# Patient Record
Sex: Male | Born: 1940
Health system: Southern US, Community
[De-identification: ages and names within clinical notes are randomized; demographics above are authoritative.]

## PROBLEM LIST (undated history)

## (undated) DIAGNOSIS — C9000 Multiple myeloma not having achieved remission: Secondary | ICD-10-CM

## (undated) DIAGNOSIS — J439 Emphysema, unspecified: Secondary | ICD-10-CM

## (undated) DIAGNOSIS — M549 Dorsalgia, unspecified: Secondary | ICD-10-CM

## (undated) DIAGNOSIS — G709 Myoneural disorder, unspecified: Secondary | ICD-10-CM

## (undated) DIAGNOSIS — N400 Enlarged prostate without lower urinary tract symptoms: Secondary | ICD-10-CM

## (undated) DIAGNOSIS — J42 Unspecified chronic bronchitis: Secondary | ICD-10-CM

## (undated) DIAGNOSIS — E079 Disorder of thyroid, unspecified: Secondary | ICD-10-CM

## (undated) DIAGNOSIS — I10 Essential (primary) hypertension: Secondary | ICD-10-CM

## (undated) DIAGNOSIS — I509 Heart failure, unspecified: Secondary | ICD-10-CM

## (undated) DIAGNOSIS — M542 Cervicalgia: Secondary | ICD-10-CM

## (undated) DIAGNOSIS — K219 Gastro-esophageal reflux disease without esophagitis: Secondary | ICD-10-CM

## (undated) DIAGNOSIS — G8929 Other chronic pain: Secondary | ICD-10-CM

## (undated) DIAGNOSIS — J45909 Unspecified asthma, uncomplicated: Secondary | ICD-10-CM

## (undated) DIAGNOSIS — I82441 Acute embolism and thrombosis of right tibial vein: Secondary | ICD-10-CM

## (undated) DIAGNOSIS — H269 Unspecified cataract: Secondary | ICD-10-CM

## (undated) DIAGNOSIS — C801 Malignant (primary) neoplasm, unspecified: Secondary | ICD-10-CM

## (undated) DIAGNOSIS — M2550 Pain in unspecified joint: Secondary | ICD-10-CM

## (undated) DIAGNOSIS — H3530 Unspecified macular degeneration: Secondary | ICD-10-CM

## (undated) DIAGNOSIS — M1990 Unspecified osteoarthritis, unspecified site: Secondary | ICD-10-CM

## (undated) DIAGNOSIS — L299 Pruritus, unspecified: Secondary | ICD-10-CM

## (undated) DIAGNOSIS — M79609 Pain in unspecified limb: Secondary | ICD-10-CM

## (undated) DIAGNOSIS — E785 Hyperlipidemia, unspecified: Secondary | ICD-10-CM

## (undated) DIAGNOSIS — J189 Pneumonia, unspecified organism: Secondary | ICD-10-CM

## (undated) DIAGNOSIS — I219 Acute myocardial infarction, unspecified: Secondary | ICD-10-CM

## (undated) DIAGNOSIS — Z860100 Personal history of colon polyps, unspecified: Secondary | ICD-10-CM

## (undated) DIAGNOSIS — G4730 Sleep apnea, unspecified: Secondary | ICD-10-CM

## (undated) DIAGNOSIS — M810 Age-related osteoporosis without current pathological fracture: Secondary | ICD-10-CM

## (undated) DIAGNOSIS — R42 Dizziness and giddiness: Secondary | ICD-10-CM

## (undated) DIAGNOSIS — T4145XA Adverse effect of unspecified anesthetic, initial encounter: Secondary | ICD-10-CM

## (undated) DIAGNOSIS — K5790 Diverticulosis of intestine, part unspecified, without perforation or abscess without bleeding: Secondary | ICD-10-CM

## (undated) DIAGNOSIS — T8859XA Other complications of anesthesia, initial encounter: Secondary | ICD-10-CM

## (undated) HISTORY — DX: Age-related osteoporosis without current pathological fracture: M81.0

## (undated) HISTORY — DX: Heart failure, unspecified: I50.9

## (undated) HISTORY — DX: Multiple myeloma not having achieved remission: C90.00

## (undated) HISTORY — DX: Unspecified cataract: H26.9

## (undated) HISTORY — PX: CARDIAC CATHETERIZATION: SHX172

## (undated) HISTORY — DX: Pain in unspecified limb: M79.609

## (undated) HISTORY — PX: COLONOSCOPY: SHX174

## (undated) HISTORY — DX: Acute embolism and thrombosis of right tibial vein: I82.441

## (undated) HISTORY — PX: BONE MARROW TRANSPLANT: SHX200

## (undated) HISTORY — PX: OTHER SURGICAL HISTORY: SHX169

## (undated) HISTORY — DX: Sleep apnea, unspecified: G47.30

## (undated) HISTORY — DX: Disorder of thyroid, unspecified: E07.9

## (undated) HISTORY — DX: Emphysema, unspecified: J43.9

## (undated) HISTORY — DX: Myoneural disorder, unspecified: G70.9

---

## 2004-02-15 HISTORY — PX: HERNIA REPAIR: SHX51

## 2008-02-15 DIAGNOSIS — I219 Acute myocardial infarction, unspecified: Secondary | ICD-10-CM

## 2008-02-15 HISTORY — DX: Acute myocardial infarction, unspecified: I21.9

## 2011-02-28 DIAGNOSIS — L578 Other skin changes due to chronic exposure to nonionizing radiation: Secondary | ICD-10-CM | POA: Diagnosis not present

## 2011-02-28 DIAGNOSIS — D492 Neoplasm of unspecified behavior of bone, soft tissue, and skin: Secondary | ICD-10-CM | POA: Diagnosis not present

## 2011-02-28 DIAGNOSIS — C44319 Basal cell carcinoma of skin of other parts of face: Secondary | ICD-10-CM | POA: Diagnosis not present

## 2011-03-08 DIAGNOSIS — D492 Neoplasm of unspecified behavior of bone, soft tissue, and skin: Secondary | ICD-10-CM | POA: Diagnosis not present

## 2011-03-08 DIAGNOSIS — L578 Other skin changes due to chronic exposure to nonionizing radiation: Secondary | ICD-10-CM | POA: Diagnosis not present

## 2011-03-08 DIAGNOSIS — C44319 Basal cell carcinoma of skin of other parts of face: Secondary | ICD-10-CM | POA: Diagnosis not present

## 2011-03-09 DIAGNOSIS — C44319 Basal cell carcinoma of skin of other parts of face: Secondary | ICD-10-CM | POA: Diagnosis not present

## 2011-03-18 DIAGNOSIS — I10 Essential (primary) hypertension: Secondary | ICD-10-CM | POA: Diagnosis not present

## 2011-03-18 DIAGNOSIS — Z79899 Other long term (current) drug therapy: Secondary | ICD-10-CM | POA: Diagnosis not present

## 2011-03-18 DIAGNOSIS — K219 Gastro-esophageal reflux disease without esophagitis: Secondary | ICD-10-CM | POA: Diagnosis not present

## 2011-03-18 DIAGNOSIS — I2510 Atherosclerotic heart disease of native coronary artery without angina pectoris: Secondary | ICD-10-CM | POA: Diagnosis not present

## 2011-03-18 DIAGNOSIS — Z7982 Long term (current) use of aspirin: Secondary | ICD-10-CM | POA: Diagnosis not present

## 2011-03-18 DIAGNOSIS — Z09 Encounter for follow-up examination after completed treatment for conditions other than malignant neoplasm: Secondary | ICD-10-CM | POA: Diagnosis not present

## 2011-03-18 DIAGNOSIS — I259 Chronic ischemic heart disease, unspecified: Secondary | ICD-10-CM | POA: Diagnosis not present

## 2011-03-18 DIAGNOSIS — E785 Hyperlipidemia, unspecified: Secondary | ICD-10-CM | POA: Diagnosis not present

## 2011-03-18 DIAGNOSIS — I252 Old myocardial infarction: Secondary | ICD-10-CM | POA: Diagnosis not present

## 2011-03-18 DIAGNOSIS — J45909 Unspecified asthma, uncomplicated: Secondary | ICD-10-CM | POA: Diagnosis not present

## 2011-04-06 DIAGNOSIS — Z85828 Personal history of other malignant neoplasm of skin: Secondary | ICD-10-CM | POA: Diagnosis not present

## 2011-04-06 DIAGNOSIS — L905 Scar conditions and fibrosis of skin: Secondary | ICD-10-CM | POA: Diagnosis not present

## 2011-04-29 DIAGNOSIS — R51 Headache: Secondary | ICD-10-CM | POA: Diagnosis not present

## 2011-04-29 DIAGNOSIS — I10 Essential (primary) hypertension: Secondary | ICD-10-CM | POA: Diagnosis not present

## 2011-04-29 DIAGNOSIS — R42 Dizziness and giddiness: Secondary | ICD-10-CM | POA: Diagnosis not present

## 2011-05-04 DIAGNOSIS — L905 Scar conditions and fibrosis of skin: Secondary | ICD-10-CM | POA: Diagnosis not present

## 2011-05-04 DIAGNOSIS — Z85828 Personal history of other malignant neoplasm of skin: Secondary | ICD-10-CM | POA: Diagnosis not present

## 2011-05-11 DIAGNOSIS — J00 Acute nasopharyngitis [common cold]: Secondary | ICD-10-CM | POA: Diagnosis not present

## 2011-05-11 DIAGNOSIS — I10 Essential (primary) hypertension: Secondary | ICD-10-CM | POA: Diagnosis not present

## 2011-06-06 DIAGNOSIS — Z87891 Personal history of nicotine dependence: Secondary | ICD-10-CM | POA: Diagnosis not present

## 2011-06-06 DIAGNOSIS — R0789 Other chest pain: Secondary | ICD-10-CM | POA: Diagnosis not present

## 2011-06-06 DIAGNOSIS — I2510 Atherosclerotic heart disease of native coronary artery without angina pectoris: Secondary | ICD-10-CM | POA: Diagnosis not present

## 2011-06-06 DIAGNOSIS — I498 Other specified cardiac arrhythmias: Secondary | ICD-10-CM | POA: Diagnosis not present

## 2011-06-06 DIAGNOSIS — R918 Other nonspecific abnormal finding of lung field: Secondary | ICD-10-CM | POA: Diagnosis not present

## 2011-06-06 DIAGNOSIS — N400 Enlarged prostate without lower urinary tract symptoms: Secondary | ICD-10-CM | POA: Diagnosis not present

## 2011-06-06 DIAGNOSIS — I2582 Chronic total occlusion of coronary artery: Secondary | ICD-10-CM | POA: Diagnosis not present

## 2011-06-06 DIAGNOSIS — I4510 Unspecified right bundle-branch block: Secondary | ICD-10-CM | POA: Diagnosis not present

## 2011-06-06 DIAGNOSIS — I259 Chronic ischemic heart disease, unspecified: Secondary | ICD-10-CM | POA: Diagnosis not present

## 2011-06-06 DIAGNOSIS — Z7982 Long term (current) use of aspirin: Secondary | ICD-10-CM | POA: Diagnosis not present

## 2011-06-06 DIAGNOSIS — J45909 Unspecified asthma, uncomplicated: Secondary | ICD-10-CM | POA: Diagnosis present

## 2011-06-06 DIAGNOSIS — F411 Generalized anxiety disorder: Secondary | ICD-10-CM | POA: Diagnosis not present

## 2011-06-06 DIAGNOSIS — I452 Bifascicular block: Secondary | ICD-10-CM | POA: Diagnosis not present

## 2011-06-06 DIAGNOSIS — K2100 Gastro-esophageal reflux disease with esophagitis, without bleeding: Secondary | ICD-10-CM | POA: Diagnosis present

## 2011-06-06 DIAGNOSIS — E785 Hyperlipidemia, unspecified: Secondary | ICD-10-CM | POA: Diagnosis present

## 2011-06-06 DIAGNOSIS — R079 Chest pain, unspecified: Secondary | ICD-10-CM | POA: Diagnosis not present

## 2011-06-06 DIAGNOSIS — Z79899 Other long term (current) drug therapy: Secondary | ICD-10-CM | POA: Diagnosis not present

## 2011-06-06 DIAGNOSIS — K219 Gastro-esophageal reflux disease without esophagitis: Secondary | ICD-10-CM | POA: Diagnosis not present

## 2011-06-06 DIAGNOSIS — I209 Angina pectoris, unspecified: Secondary | ICD-10-CM | POA: Diagnosis present

## 2011-06-06 DIAGNOSIS — I10 Essential (primary) hypertension: Secondary | ICD-10-CM | POA: Diagnosis not present

## 2011-06-06 DIAGNOSIS — I200 Unstable angina: Secondary | ICD-10-CM | POA: Diagnosis not present

## 2011-06-06 DIAGNOSIS — I252 Old myocardial infarction: Secondary | ICD-10-CM | POA: Diagnosis not present

## 2011-06-06 DIAGNOSIS — R55 Syncope and collapse: Secondary | ICD-10-CM | POA: Diagnosis not present

## 2011-06-16 DIAGNOSIS — R42 Dizziness and giddiness: Secondary | ICD-10-CM | POA: Diagnosis not present

## 2011-06-16 DIAGNOSIS — J45909 Unspecified asthma, uncomplicated: Secondary | ICD-10-CM | POA: Diagnosis not present

## 2011-06-16 DIAGNOSIS — I259 Chronic ischemic heart disease, unspecified: Secondary | ICD-10-CM | POA: Diagnosis not present

## 2011-06-16 DIAGNOSIS — R079 Chest pain, unspecified: Secondary | ICD-10-CM | POA: Diagnosis not present

## 2011-06-22 DIAGNOSIS — Z85828 Personal history of other malignant neoplasm of skin: Secondary | ICD-10-CM | POA: Diagnosis not present

## 2011-06-22 DIAGNOSIS — L905 Scar conditions and fibrosis of skin: Secondary | ICD-10-CM | POA: Diagnosis not present

## 2011-07-08 DIAGNOSIS — I2510 Atherosclerotic heart disease of native coronary artery without angina pectoris: Secondary | ICD-10-CM | POA: Diagnosis not present

## 2011-07-08 DIAGNOSIS — J45909 Unspecified asthma, uncomplicated: Secondary | ICD-10-CM | POA: Diagnosis not present

## 2011-07-08 DIAGNOSIS — I252 Old myocardial infarction: Secondary | ICD-10-CM | POA: Diagnosis not present

## 2011-07-08 DIAGNOSIS — K219 Gastro-esophageal reflux disease without esophagitis: Secondary | ICD-10-CM | POA: Diagnosis not present

## 2011-07-08 DIAGNOSIS — E785 Hyperlipidemia, unspecified: Secondary | ICD-10-CM | POA: Diagnosis not present

## 2011-07-08 DIAGNOSIS — I10 Essential (primary) hypertension: Secondary | ICD-10-CM | POA: Diagnosis not present

## 2011-09-05 DIAGNOSIS — L820 Inflamed seborrheic keratosis: Secondary | ICD-10-CM | POA: Diagnosis not present

## 2011-09-05 DIAGNOSIS — Z85828 Personal history of other malignant neoplasm of skin: Secondary | ICD-10-CM | POA: Diagnosis not present

## 2011-09-05 DIAGNOSIS — L578 Other skin changes due to chronic exposure to nonionizing radiation: Secondary | ICD-10-CM | POA: Diagnosis not present

## 2011-09-05 DIAGNOSIS — L570 Actinic keratosis: Secondary | ICD-10-CM | POA: Diagnosis not present

## 2011-09-27 DIAGNOSIS — M542 Cervicalgia: Secondary | ICD-10-CM | POA: Diagnosis not present

## 2011-09-27 DIAGNOSIS — M47812 Spondylosis without myelopathy or radiculopathy, cervical region: Secondary | ICD-10-CM | POA: Diagnosis not present

## 2011-10-04 DIAGNOSIS — M542 Cervicalgia: Secondary | ICD-10-CM | POA: Diagnosis not present

## 2011-10-19 DIAGNOSIS — M542 Cervicalgia: Secondary | ICD-10-CM | POA: Diagnosis not present

## 2011-10-30 DIAGNOSIS — M5020 Other cervical disc displacement, unspecified cervical region: Secondary | ICD-10-CM | POA: Diagnosis not present

## 2011-10-31 DIAGNOSIS — Z0000 Encounter for general adult medical examination without abnormal findings: Secondary | ICD-10-CM | POA: Diagnosis not present

## 2011-10-31 DIAGNOSIS — J449 Chronic obstructive pulmonary disease, unspecified: Secondary | ICD-10-CM | POA: Diagnosis not present

## 2011-10-31 DIAGNOSIS — I10 Essential (primary) hypertension: Secondary | ICD-10-CM | POA: Diagnosis not present

## 2011-10-31 DIAGNOSIS — E785 Hyperlipidemia, unspecified: Secondary | ICD-10-CM | POA: Diagnosis not present

## 2011-10-31 DIAGNOSIS — D649 Anemia, unspecified: Secondary | ICD-10-CM | POA: Diagnosis not present

## 2011-10-31 DIAGNOSIS — R972 Elevated prostate specific antigen [PSA]: Secondary | ICD-10-CM | POA: Insufficient documentation

## 2011-11-15 DIAGNOSIS — M47812 Spondylosis without myelopathy or radiculopathy, cervical region: Secondary | ICD-10-CM | POA: Diagnosis not present

## 2011-11-15 DIAGNOSIS — M542 Cervicalgia: Secondary | ICD-10-CM | POA: Diagnosis not present

## 2011-11-15 DIAGNOSIS — M4802 Spinal stenosis, cervical region: Secondary | ICD-10-CM | POA: Diagnosis not present

## 2012-01-06 DIAGNOSIS — Z23 Encounter for immunization: Secondary | ICD-10-CM | POA: Diagnosis not present

## 2012-01-06 DIAGNOSIS — I2510 Atherosclerotic heart disease of native coronary artery without angina pectoris: Secondary | ICD-10-CM | POA: Diagnosis not present

## 2012-01-16 DIAGNOSIS — I658 Occlusion and stenosis of other precerebral arteries: Secondary | ICD-10-CM | POA: Diagnosis not present

## 2012-01-19 DIAGNOSIS — M5412 Radiculopathy, cervical region: Secondary | ICD-10-CM | POA: Diagnosis not present

## 2012-01-19 DIAGNOSIS — M4802 Spinal stenosis, cervical region: Secondary | ICD-10-CM | POA: Diagnosis not present

## 2012-01-19 DIAGNOSIS — M542 Cervicalgia: Secondary | ICD-10-CM | POA: Diagnosis not present

## 2012-01-20 ENCOUNTER — Other Ambulatory Visit: Payer: Self-pay | Admitting: Neurosurgery

## 2012-01-24 ENCOUNTER — Encounter (HOSPITAL_COMMUNITY): Payer: Self-pay | Admitting: Pharmacy Technician

## 2012-01-27 ENCOUNTER — Encounter (HOSPITAL_COMMUNITY)
Admission: RE | Admit: 2012-01-27 | Discharge: 2012-01-27 | Disposition: A | Discharge status: Home / Self Care | Payer: Medicare Other | Source: Ambulatory Visit | Attending: Neurosurgery | Admitting: Neurosurgery

## 2012-01-27 ENCOUNTER — Encounter (HOSPITAL_COMMUNITY): Payer: Self-pay

## 2012-01-27 HISTORY — DX: Essential (primary) hypertension: I10

## 2012-01-27 HISTORY — DX: Personal history of colonic polyps: Z86.010

## 2012-01-27 HISTORY — DX: Other chronic pain: G89.29

## 2012-01-27 HISTORY — DX: Unspecified asthma, uncomplicated: J45.909

## 2012-01-27 HISTORY — DX: Pain in unspecified joint: M25.50

## 2012-01-27 HISTORY — DX: Other complications of anesthesia, initial encounter: T88.59XA

## 2012-01-27 HISTORY — DX: Acute myocardial infarction, unspecified: I21.9

## 2012-01-27 HISTORY — DX: Hyperlipidemia, unspecified: E78.5

## 2012-01-27 HISTORY — DX: Adverse effect of unspecified anesthetic, initial encounter: T41.45XA

## 2012-01-27 HISTORY — DX: Unspecified macular degeneration: H35.30

## 2012-01-27 HISTORY — DX: Dizziness and giddiness: R42

## 2012-01-27 HISTORY — DX: Unspecified osteoarthritis, unspecified site: M19.90

## 2012-01-27 HISTORY — DX: Unspecified chronic bronchitis: J42

## 2012-01-27 HISTORY — DX: Dorsalgia, unspecified: M54.9

## 2012-01-27 HISTORY — DX: Emphysema, unspecified: J43.9

## 2012-01-27 HISTORY — DX: Benign prostatic hyperplasia without lower urinary tract symptoms: N40.0

## 2012-01-27 HISTORY — DX: Cervicalgia: M54.2

## 2012-01-27 HISTORY — DX: Pneumonia, unspecified organism: J18.9

## 2012-01-27 HISTORY — DX: Personal history of colon polyps, unspecified: Z86.0100

## 2012-01-27 HISTORY — DX: Diverticulosis of intestine, part unspecified, without perforation or abscess without bleeding: K57.90

## 2012-01-27 HISTORY — DX: Gastro-esophageal reflux disease without esophagitis: K21.9

## 2012-01-27 HISTORY — DX: Pruritus, unspecified: L29.9

## 2012-01-27 LAB — CBC
Hemoglobin: 11.9 g/dL — ABNORMAL LOW (ref 13.0–17.0)
MCH: 29 pg (ref 26.0–34.0)
MCHC: 32.7 g/dL (ref 30.0–36.0)
MCV: 88.6 fL (ref 78.0–100.0)
RBC: 4.11 MIL/uL — ABNORMAL LOW (ref 4.22–5.81)

## 2012-01-27 LAB — BASIC METABOLIC PANEL
BUN: 11 mg/dL (ref 6–23)
CO2: 27 mEq/L (ref 19–32)
GFR calc non Af Amer: 81 mL/min — ABNORMAL LOW (ref >90)
Glucose, Bld: 94 mg/dL (ref 70–99)
Potassium: 3.8 mEq/L (ref 3.5–5.1)
Sodium: 139 mEq/L (ref 135–145)

## 2012-01-27 LAB — SURGICAL PCR SCREEN: MRSA, PCR: NEGATIVE

## 2012-01-27 NOTE — Progress Notes (Signed)
01/27/12 1521  OBSTRUCTIVE SLEEP APNEA  Have you ever been diagnosed with sleep apnea through a sleep study? No  Do you snore loudly (loud enough to be heard through closed doors)?  0  Do you often feel tired, fatigued, or sleepy during the daytime? 0  Has anyone observed you stop breathing during your sleep? 1  Do you have, or are you being treated for high blood pressure? 1  BMI more than 35 kg/m2? 0  Age over 71 years old? 1  Neck circumference greater than 40 cm/18 inches? 0  Gender: 1  Obstructive Sleep Apnea Score 4   Score 4 or greater  Results sent to PCP

## 2012-01-27 NOTE — Progress Notes (Signed)
robie with [facility] is medical md   Stress test done-to request from [facility] done at [facility] to request   Cardiologist is bharthia upahya-last visit was 01/06/12-----  EKG to be request from DR.Upahya CXR done within past yr-to request from [facility] Pt is in a study for lowering triglycerides

## 2012-01-27 NOTE — Pre-Procedure Instructions (Addendum)
20 Makai Dumond  01/27/2012   Your procedure is scheduled on:  Tues, Dec 17 @ 12:25 PM  Report to [facility] at 9:15 AM.  Call this number if you have problems the morning of surgery: [phone]   Remember:   Do not eat food:After Midnight.   Take these medicines the morning of surgery with A SIP OF WATER: Carvedilol(Coreg),Fluticasone(Advair)<Bring Your Inhaler With You>,Mometasone(Asmanex),Pantoprazole(Protonix),and Tamsulosin(Flomax)   Do not wear jewelry  Do not wear lotions, powders, or colognes. You may wear deodorant.             Men may shave face and neck.  Do not bring valuables to the hospital.  Contacts, dentures or bridgework may not be worn into surgery.  Leave suitcase in the car. After surgery it may be brought to your room.  For patients admitted to the hospital, checkout time is 11:00 AM the day of discharge.   Patients discharged the day of surgery will not be allowed to drive home.    Special Instructions: Shower using CHG 2 nights before surgery and the night before surgery.  If you shower the day of surgery use CHG.  Use special wash - you have one bottle of CHG for all showers.  You should use approximately 1/3 of the bottle for each shower.   Please read over the following fact sheets that you were given: Pain Booklet, Coughing and Deep Breathing, MRSA Information and Surgical Site Infection Prevention

## 2012-01-27 NOTE — Progress Notes (Addendum)
Pt states only had a nitroglycerin 17yrs ago

## 2012-01-30 ENCOUNTER — Encounter (HOSPITAL_COMMUNITY): Payer: Self-pay | Admitting: Vascular Surgery

## 2012-01-30 MED ORDER — CEFAZOLIN SODIUM-DEXTROSE 2-3 GM-% IV SOLR
2.0000 g | INTRAVENOUS | Status: AC
  Administered 2012-01-31: 2 g via INTRAVENOUS
  Filled 2012-01-30: qty 50

## 2012-01-30 NOTE — Consult Note (Signed)
Anesthesia chart review: Patient is a 71 year old male scheduled for C5-6 ACDF on 01/31/2012 by Dr. Jeral Fruit. History includes smoking, CAD/MI '10, hypertension, emphysema, BPH, macular degeneration, GERD, diverticulosis, asthma, hyperlipidemia.  For his anesthesia history, he reports an episode of severe hiccups requiring medication following his hernia surgery.  Cardiologist is Dr. Revonda Humphrey at [facility] who cleared patient with "low risk" from a cardiac standpoint.  (Cardiac studies and CXR are from [facility].)  PCP is listed as Dr. Larey Dresser.  EKG on 06/06/11 showed SR, right BBB, LAFB, bifascicular block.    Cardiac cath 06/07/11 showed normal left main, LAD 25%, diagonal 2 75% (small), proximal left circumflex 100%, RCA 25%.  Stress echo on 08/24/10 showed EF 59%, no echocardiographic evidence of inducible ischemia, negative stress EKG.  1V CXR on 06/06/11 showed "a new focal opacity noted in the right lower lung zone." No pleural effusion or pneumothorax. Mild multilevel degenerative changes of the spine. Soft tissues unremarkable. Cardiovascular normal size and configuration. Aortic atherosclerosis.  Since his last CXR was a one view and report showed a new RLL opacity, will order a 2V CXR to be done pre-operatively.     Preoperative labs noted.  Shonna Chock, PA-C

## 2012-01-30 NOTE — Progress Notes (Deleted)
Dr. Marlene Lard office, [facility], called and reported that pt had not been seen since 11/2000,  Also, no ECHO, or EKG on file.

## 2012-01-31 ENCOUNTER — Encounter (HOSPITAL_COMMUNITY): Payer: Self-pay | Admitting: *Deleted

## 2012-01-31 ENCOUNTER — Encounter (HOSPITAL_COMMUNITY): Payer: Self-pay | Admitting: Vascular Surgery

## 2012-01-31 ENCOUNTER — Inpatient Hospital Stay (HOSPITAL_COMMUNITY)
Admission: RE | Admit: 2012-01-31 | Discharge: 2012-02-01 | DRG: 473 | Disposition: A | Discharge status: Home / Self Care | Payer: Medicare Other | Source: Ambulatory Visit | Attending: Neurosurgery | Admitting: Neurosurgery

## 2012-01-31 ENCOUNTER — Inpatient Hospital Stay (HOSPITAL_COMMUNITY): Payer: Medicare Other | Admitting: Vascular Surgery

## 2012-01-31 ENCOUNTER — Inpatient Hospital Stay (HOSPITAL_COMMUNITY): Payer: Medicare Other

## 2012-01-31 ENCOUNTER — Encounter (HOSPITAL_COMMUNITY)
Admission: RE | Disposition: A | Discharge status: Home / Self Care | Payer: Self-pay | Source: Ambulatory Visit | Attending: Neurosurgery

## 2012-01-31 DIAGNOSIS — F17200 Nicotine dependence, unspecified, uncomplicated: Secondary | ICD-10-CM | POA: Diagnosis present

## 2012-01-31 DIAGNOSIS — Z79899 Other long term (current) drug therapy: Secondary | ICD-10-CM

## 2012-01-31 DIAGNOSIS — E785 Hyperlipidemia, unspecified: Secondary | ICD-10-CM | POA: Diagnosis not present

## 2012-01-31 DIAGNOSIS — Z85828 Personal history of other malignant neoplasm of skin: Secondary | ICD-10-CM

## 2012-01-31 DIAGNOSIS — K219 Gastro-esophageal reflux disease without esophagitis: Secondary | ICD-10-CM | POA: Diagnosis present

## 2012-01-31 DIAGNOSIS — M539 Dorsopathy, unspecified: Secondary | ICD-10-CM | POA: Diagnosis not present

## 2012-01-31 DIAGNOSIS — Z01812 Encounter for preprocedural laboratory examination: Secondary | ICD-10-CM

## 2012-01-31 DIAGNOSIS — I2510 Atherosclerotic heart disease of native coronary artery without angina pectoris: Secondary | ICD-10-CM | POA: Diagnosis present

## 2012-01-31 DIAGNOSIS — I252 Old myocardial infarction: Secondary | ICD-10-CM

## 2012-01-31 DIAGNOSIS — I10 Essential (primary) hypertension: Secondary | ICD-10-CM | POA: Diagnosis present

## 2012-01-31 DIAGNOSIS — H3530 Unspecified macular degeneration: Secondary | ICD-10-CM | POA: Diagnosis present

## 2012-01-31 DIAGNOSIS — Z7982 Long term (current) use of aspirin: Secondary | ICD-10-CM

## 2012-01-31 DIAGNOSIS — M542 Cervicalgia: Secondary | ICD-10-CM | POA: Diagnosis not present

## 2012-01-31 DIAGNOSIS — M5000 Cervical disc disorder with myelopathy, unspecified cervical region: Secondary | ICD-10-CM | POA: Diagnosis not present

## 2012-01-31 DIAGNOSIS — J4489 Other specified chronic obstructive pulmonary disease: Secondary | ICD-10-CM | POA: Diagnosis present

## 2012-01-31 DIAGNOSIS — J449 Chronic obstructive pulmonary disease, unspecified: Secondary | ICD-10-CM | POA: Diagnosis present

## 2012-01-31 DIAGNOSIS — N400 Enlarged prostate without lower urinary tract symptoms: Secondary | ICD-10-CM | POA: Diagnosis present

## 2012-01-31 DIAGNOSIS — M5020 Other cervical disc displacement, unspecified cervical region: Secondary | ICD-10-CM | POA: Diagnosis not present

## 2012-01-31 DIAGNOSIS — M4802 Spinal stenosis, cervical region: Secondary | ICD-10-CM | POA: Diagnosis not present

## 2012-01-31 DIAGNOSIS — M5030 Other cervical disc degeneration, unspecified cervical region: Secondary | ICD-10-CM | POA: Diagnosis not present

## 2012-01-31 DIAGNOSIS — M5412 Radiculopathy, cervical region: Secondary | ICD-10-CM | POA: Diagnosis not present

## 2012-01-31 HISTORY — PX: ANTERIOR CERVICAL DECOMP/DISCECTOMY FUSION: SHX1161

## 2012-01-31 SURGERY — ANTERIOR CERVICAL DECOMPRESSION/DISCECTOMY FUSION 1 LEVEL
Anesthesia: General | Site: Neck

## 2012-01-31 MED ORDER — ZOLPIDEM TARTRATE 5 MG PO TABS: 5.0000 mg | ORAL_TABLET | Freq: Every evening | ORAL | Status: DC | PRN

## 2012-01-31 MED ORDER — PROMETHAZINE HCL 25 MG/ML IJ SOLN: 6.2500 mg | INTRAMUSCULAR | Status: DC | PRN

## 2012-01-31 MED ORDER — SODIUM CHLORIDE 0.9 % IJ SOLN: 3.0000 mL | Freq: Two times a day (BID) | INTRAMUSCULAR | Status: DC

## 2012-01-31 MED ORDER — VECURONIUM BROMIDE 10 MG IV SOLR
INTRAVENOUS | Status: DC | PRN
  Administered 2012-01-31 (×2): 1 mg via INTRAVENOUS

## 2012-01-31 MED ORDER — 0.9 % SODIUM CHLORIDE (POUR BTL) OPTIME
TOPICAL | Status: DC | PRN
  Administered 2012-01-31: 1000 mL

## 2012-01-31 MED ORDER — ACETAMINOPHEN 325 MG PO TABS: 650.0000 mg | ORAL_TABLET | ORAL | Status: DC | PRN

## 2012-01-31 MED ORDER — CARVEDILOL 12.5 MG PO TABS
12.5000 mg | ORAL_TABLET | Freq: Once | ORAL | Status: AC
  Administered 2012-01-31: 12.5 mg via ORAL
  Filled 2012-01-31: qty 1

## 2012-01-31 MED ORDER — ACETAMINOPHEN 650 MG RE SUPP: 650.0000 mg | RECTAL | Status: DC | PRN

## 2012-01-31 MED ORDER — SODIUM CHLORIDE 0.9 % IV SOLN
INTRAVENOUS | Status: DC
  Administered 2012-01-31: 23:00:00 via INTRAVENOUS

## 2012-01-31 MED ORDER — ALBUTEROL SULFATE HFA 108 (90 BASE) MCG/ACT IN AERS
INHALATION_SPRAY | RESPIRATORY_TRACT | Status: DC | PRN
  Administered 2012-01-31 (×2): 2 via RESPIRATORY_TRACT

## 2012-01-31 MED ORDER — LACTATED RINGERS IV SOLN
INTRAVENOUS | Status: DC | PRN
  Administered 2012-01-31 (×2): via INTRAVENOUS

## 2012-01-31 MED ORDER — SIMVASTATIN 20 MG PO TABS
20.0000 mg | ORAL_TABLET | Freq: Every day | ORAL | Status: DC
  Filled 2012-01-31: qty 1

## 2012-01-31 MED ORDER — OXYCODONE-ACETAMINOPHEN 5-325 MG PO TABS
1.0000 | ORAL_TABLET | ORAL | Status: DC | PRN
  Administered 2012-01-31 – 2012-02-01 (×4): 2 via ORAL
  Filled 2012-01-31 (×4): qty 2

## 2012-01-31 MED ORDER — LIDOCAINE HCL (CARDIAC) 20 MG/ML IV SOLN
INTRAVENOUS | Status: DC | PRN
  Administered 2012-01-31: 100 mg via INTRAVENOUS

## 2012-01-31 MED ORDER — GLYCOPYRROLATE 0.2 MG/ML IJ SOLN
INTRAMUSCULAR | Status: DC | PRN
  Administered 2012-01-31: 0.2 mg via INTRAVENOUS
  Administered 2012-01-31: 0.4 mg via INTRAVENOUS

## 2012-01-31 MED ORDER — CEFAZOLIN SODIUM 1-5 GM-% IV SOLN
1.0000 g | Freq: Three times a day (TID) | INTRAVENOUS | Status: AC
  Administered 2012-01-31 – 2012-02-01 (×2): 1 g via INTRAVENOUS
  Filled 2012-01-31 (×3): qty 50

## 2012-01-31 MED ORDER — SODIUM CHLORIDE 0.9 % IV SOLN: 250.0000 mL | INTRAVENOUS | Status: DC

## 2012-01-31 MED ORDER — HYDROMORPHONE HCL PF 1 MG/ML IJ SOLN
INTRAMUSCULAR | Status: AC
  Administered 2012-01-31: 0.5 mg via INTRAVENOUS
  Filled 2012-01-31: qty 1

## 2012-01-31 MED ORDER — ONDANSETRON HCL 4 MG/2ML IJ SOLN
INTRAMUSCULAR | Status: DC | PRN
  Administered 2012-01-31: 4 mg via INTRAVENOUS

## 2012-01-31 MED ORDER — MONTELUKAST SODIUM 10 MG PO TABS
10.0000 mg | ORAL_TABLET | Freq: Every day | ORAL | Status: DC
  Administered 2012-01-31: 10 mg via ORAL
  Filled 2012-01-31 (×2): qty 1

## 2012-01-31 MED ORDER — SODIUM CHLORIDE 0.9 % IJ SOLN: 3.0000 mL | INTRAMUSCULAR | Status: DC | PRN

## 2012-01-31 MED ORDER — THROMBIN 5000 UNITS EX SOLR
OROMUCOSAL | Status: DC | PRN
  Administered 2012-01-31: 17:00:00 via TOPICAL

## 2012-01-31 MED ORDER — IRBESARTAN 75 MG PO TABS
75.0000 mg | ORAL_TABLET | Freq: Every day | ORAL | Status: DC
  Administered 2012-02-01: 75 mg via ORAL
  Filled 2012-01-31 (×2): qty 1

## 2012-01-31 MED ORDER — TAMSULOSIN HCL 0.4 MG PO CAPS
0.4000 mg | ORAL_CAPSULE | Freq: Every day | ORAL | Status: DC
  Administered 2012-01-31: 0.4 mg via ORAL
  Filled 2012-01-31 (×2): qty 1

## 2012-01-31 MED ORDER — THROMBIN 5000 UNITS EX SOLR
CUTANEOUS | Status: DC | PRN
  Administered 2012-01-31 (×2): 5000 [IU] via TOPICAL

## 2012-01-31 MED ORDER — FENTANYL CITRATE 0.05 MG/ML IJ SOLN
INTRAMUSCULAR | Status: DC | PRN
  Administered 2012-01-31: 50 ug via INTRAVENOUS
  Administered 2012-01-31: 100 ug via INTRAVENOUS
  Administered 2012-01-31 (×2): 50 ug via INTRAVENOUS

## 2012-01-31 MED ORDER — DEXAMETHASONE SODIUM PHOSPHATE 4 MG/ML IJ SOLN
INTRAMUSCULAR | Status: DC | PRN
  Administered 2012-01-31: 8 mg via INTRAVENOUS

## 2012-01-31 MED ORDER — ISOSORBIDE MONONITRATE ER 30 MG PO TB24
30.0000 mg | ORAL_TABLET | Freq: Every day | ORAL | Status: DC
  Administered 2012-02-01: 30 mg via ORAL
  Filled 2012-01-31: qty 1

## 2012-01-31 MED ORDER — PROPOFOL 10 MG/ML IV BOLUS
INTRAVENOUS | Status: DC | PRN
  Administered 2012-01-31: 20 mg via INTRAVENOUS
  Administered 2012-01-31: 180 mg via INTRAVENOUS

## 2012-01-31 MED ORDER — HEMOSTATIC AGENTS (NO CHARGE) OPTIME
TOPICAL | Status: DC | PRN
  Administered 2012-01-31: 1 via TOPICAL

## 2012-01-31 MED ORDER — MENTHOL 3 MG MT LOZG
1.0000 | LOZENGE | OROMUCOSAL | Status: DC | PRN
  Filled 2012-01-31: qty 18

## 2012-01-31 MED ORDER — HYDROCHLOROTHIAZIDE 12.5 MG PO CAPS
12.5000 mg | ORAL_CAPSULE | Freq: Every day | ORAL | Status: DC
  Administered 2012-02-01: 12.5 mg via ORAL
  Filled 2012-01-31: qty 1

## 2012-01-31 MED ORDER — PHENOL 1.4 % MT LIQD
1.0000 | OROMUCOSAL | Status: DC | PRN
  Administered 2012-02-01: 1 via OROMUCOSAL
  Filled 2012-01-31: qty 177

## 2012-01-31 MED ORDER — CARVEDILOL 12.5 MG PO TABS
12.5000 mg | ORAL_TABLET | Freq: Two times a day (BID) | ORAL | Status: DC
  Administered 2012-02-01: 12.5 mg via ORAL
  Filled 2012-01-31 (×3): qty 1

## 2012-01-31 MED ORDER — EPHEDRINE SULFATE 50 MG/ML IJ SOLN
INTRAMUSCULAR | Status: DC | PRN
  Administered 2012-01-31: 10 mg via INTRAVENOUS

## 2012-01-31 MED ORDER — ONDANSETRON HCL 4 MG/2ML IJ SOLN: 4.0000 mg | INTRAMUSCULAR | Status: DC | PRN

## 2012-01-31 MED ORDER — ARTIFICIAL TEARS OP OINT
TOPICAL_OINTMENT | OPHTHALMIC | Status: DC | PRN
  Administered 2012-01-31: 1 via OPHTHALMIC

## 2012-01-31 MED ORDER — MORPHINE SULFATE 2 MG/ML IJ SOLN: 2.0000 mg | INTRAMUSCULAR | Status: DC | PRN

## 2012-01-31 MED ORDER — HYDROMORPHONE HCL PF 1 MG/ML IJ SOLN
0.2500 mg | INTRAMUSCULAR | Status: DC | PRN
  Administered 2012-01-31 (×2): 0.5 mg via INTRAVENOUS

## 2012-01-31 MED ORDER — ROCURONIUM BROMIDE 100 MG/10ML IV SOLN
INTRAVENOUS | Status: DC | PRN
  Administered 2012-01-31: 50 mg via INTRAVENOUS

## 2012-01-31 MED ORDER — DIAZEPAM 5 MG PO TABS
5.0000 mg | ORAL_TABLET | Freq: Four times a day (QID) | ORAL | Status: DC | PRN
  Administered 2012-02-01: 5 mg via ORAL
  Filled 2012-01-31: qty 1

## 2012-01-31 MED ORDER — FENTANYL CITRATE 0.05 MG/ML IJ SOLN: 50.0000 ug | Freq: Once | INTRAMUSCULAR | Status: DC

## 2012-01-31 MED ORDER — MIDAZOLAM HCL 2 MG/2ML IJ SOLN: 1.0000 mg | INTRAMUSCULAR | Status: DC | PRN

## 2012-01-31 MED ORDER — NEOSTIGMINE METHYLSULFATE 1 MG/ML IJ SOLN
INTRAMUSCULAR | Status: DC | PRN
  Administered 2012-01-31: 3 mg via INTRAVENOUS

## 2012-01-31 MED ORDER — NITROGLYCERIN 0.4 MG SL SUBL: 0.4000 mg | SUBLINGUAL_TABLET | SUBLINGUAL | Status: DC | PRN

## 2012-01-31 MED ORDER — MIDAZOLAM HCL 5 MG/5ML IJ SOLN
INTRAMUSCULAR | Status: DC | PRN
  Administered 2012-01-31: 2 mg via INTRAVENOUS

## 2012-01-31 SURGICAL SUPPLY — 52 items
BANDAGE GAUZE ELAST BULKY 4 IN (GAUZE/BANDAGES/DRESSINGS) ×4 IMPLANT
BENZOIN TINCTURE PRP APPL 2/3 (GAUZE/BANDAGES/DRESSINGS) ×2 IMPLANT
BIT DRILL SM SPINE QC 14 (BIT) ×2 IMPLANT
BLADE ULTRA TIP 2M (BLADE) ×2 IMPLANT
BUR BARREL STRAIGHT FLUTE 4.0 (BURR) ×2 IMPLANT
BUR MATCHSTICK NEURO 3.0 LAGG (BURR) ×2 IMPLANT
CANISTER SUCTION 2500CC (MISCELLANEOUS) ×2 IMPLANT
CLOTH BEACON ORANGE TIMEOUT ST (SAFETY) ×2 IMPLANT
CONT SPEC 4OZ CLIKSEAL STRL BL (MISCELLANEOUS) ×2 IMPLANT
COVER MAYO STAND STRL (DRAPES) ×2 IMPLANT
DRAPE C-ARM 42X72 X-RAY (DRAPES) ×4 IMPLANT
DRAPE LAPAROTOMY 100X72 PEDS (DRAPES) ×2 IMPLANT
DRAPE MICROSCOPE LEICA (MISCELLANEOUS) ×2 IMPLANT
DRAPE POUCH INSTRU U-SHP 10X18 (DRAPES) ×2 IMPLANT
DURAPREP 6ML APPLICATOR 50/CS (WOUND CARE) ×2 IMPLANT
DURASEAL SPINE SEALANT 3ML (MISCELLANEOUS) ×2 IMPLANT
ELECT REM PT RETURN 9FT ADLT (ELECTROSURGICAL) ×2
ELECTRODE REM PT RTRN 9FT ADLT (ELECTROSURGICAL) ×1 IMPLANT
GAUZE SPONGE 4X4 16PLY XRAY LF (GAUZE/BANDAGES/DRESSINGS) IMPLANT
GLOVE BIO SURGEON STRL SZ 6.5 (GLOVE) ×4 IMPLANT
GLOVE BIOGEL M 8.0 STRL (GLOVE) ×2 IMPLANT
GLOVE BIOGEL PI IND STRL 7.0 (GLOVE) ×1 IMPLANT
GLOVE BIOGEL PI INDICATOR 7.0 (GLOVE) ×1
GLOVE EXAM NITRILE LRG STRL (GLOVE) IMPLANT
GLOVE EXAM NITRILE MD LF STRL (GLOVE) IMPLANT
GLOVE EXAM NITRILE XL STR (GLOVE) IMPLANT
GLOVE EXAM NITRILE XS STR PU (GLOVE) IMPLANT
GOWN BRE IMP SLV AUR LG STRL (GOWN DISPOSABLE) ×4 IMPLANT
GOWN BRE IMP SLV AUR XL STRL (GOWN DISPOSABLE) ×2 IMPLANT
GOWN STRL REIN 2XL LVL4 (GOWN DISPOSABLE) IMPLANT
HEMOSTAT POWDER KIT SURGIFOAM (HEMOSTASIS) ×2 IMPLANT
KIT BASIN OR (CUSTOM PROCEDURE TRAY) ×2 IMPLANT
KIT ROOM TURNOVER OR (KITS) ×2 IMPLANT
NEEDLE SPNL 22GX3.5 QUINCKE BK (NEEDLE) ×2 IMPLANT
NS IRRIG 1000ML POUR BTL (IV SOLUTION) ×2 IMPLANT
PACK LAMINECTOMY NEURO (CUSTOM PROCEDURE TRAY) ×2 IMPLANT
PATTIES SURGICAL .5 X1 (DISPOSABLE) ×2 IMPLANT
PLATE ANT CERV XTEND 1 LV 14 (Plate) ×2 IMPLANT
PUTTY BONE GRAFT KIT 2.5ML (Bone Implant) ×2 IMPLANT
RUBBERBAND STERILE (MISCELLANEOUS) ×4 IMPLANT
SCREW XTD VAR 4.2 SELF TAP (Screw) ×8 IMPLANT
SPACER COLONIAL SMALL 8MM (Spacer) ×2 IMPLANT
SPONGE GAUZE 4X4 12PLY (GAUZE/BANDAGES/DRESSINGS) ×2 IMPLANT
SPONGE INTESTINAL PEANUT (DISPOSABLE) ×2 IMPLANT
SPONGE SURGIFOAM ABS GEL SZ50 (HEMOSTASIS) ×2 IMPLANT
STRIP CLOSURE SKIN 1/2X4 (GAUZE/BANDAGES/DRESSINGS) ×2 IMPLANT
SUT VIC AB 3-0 SH 8-18 (SUTURE) ×2 IMPLANT
SYR 20ML ECCENTRIC (SYRINGE) ×2 IMPLANT
TAPE CLOTH SURG 4X10 WHT LF (GAUZE/BANDAGES/DRESSINGS) ×2 IMPLANT
TOWEL OR 17X24 6PK STRL BLUE (TOWEL DISPOSABLE) ×2 IMPLANT
TOWEL OR 17X26 10 PK STRL BLUE (TOWEL DISPOSABLE) ×2 IMPLANT
WATER STERILE IRR 1000ML POUR (IV SOLUTION) ×2 IMPLANT

## 2012-01-31 NOTE — Preoperative (Addendum)
Beta Blockers   Reason not to administer Beta Blockers:Not Applicable, last dose 01/31/12 at 07:30 am

## 2012-01-31 NOTE — Transfer of Care (Signed)
Immediate Anesthesia Transfer of Care Note  Patient: [name] [name]  Procedure(s) Performed: Procedure(s) (LRB) with comments: ANTERIOR CERVICAL DECOMPRESSION/DISCECTOMY FUSION 1 LEVEL (N/A) - Cervical Five-Six  Anterior Cervical Decompression [name] /Fusion/Plate  Patient Location: PACU  Anesthesia Type:General  Level of Consciousness: awake, alert  and oriented  Airway & Oxygen Therapy: Patient Spontanous Breathing and Patient connected to nasal cannula oxygen  Post-op Assessment: Report given to PACU RN and Patient moving all extremities X 4  Post vital signs: Reviewed and stable  Complications: No apparent anesthesia complications

## 2012-01-31 NOTE — Anesthesia Preprocedure Evaluation (Addendum)
Anesthesia Evaluation  Patient identified by MRN, date of birth, ID band Patient awake    Reviewed: Allergy & Precautions, H&P , NPO status , Patient's Chart, lab work & pertinent test results  Airway Mallampati: I TM Distance: >3 FB Neck ROM: Full    Dental  (+) Teeth Intact and Dental Advisory Given   Pulmonary asthma , COPD breath sounds clear to auscultation        Cardiovascular hypertension, + CAD and + Past MI Rhythm:Regular Rate:Normal     Neuro/Psych    GI/Hepatic GERD-  ,  Endo/Other    Renal/GU      Musculoskeletal   Abdominal   Peds  Hematology   Anesthesia Other Findings   Reproductive/Obstetrics                          Anesthesia Physical Anesthesia Plan  ASA: III  Anesthesia Plan: General   Post-op Pain Management:    Induction: Intravenous  Airway Management Planned: Oral ETT  Additional Equipment:   Intra-op Plan:   Post-operative Plan: Extubation in OR  Informed Consent: I have reviewed the patients History and Physical, chart, labs and discussed the procedure including the risks, benefits and alternatives for the proposed anesthesia with the patient or authorized representative who has indicated his/her understanding and acceptance.     Plan Discussed with: CRNA and Surgeon  Anesthesia Plan Comments:         Anesthesia Quick Evaluation

## 2012-01-31 NOTE — Anesthesia Postprocedure Evaluation (Signed)
  Anesthesia Post-op Note  Patient: [name] [name]  Procedure(s) Performed: Procedure(s) (LRB) with comments: ANTERIOR CERVICAL DECOMPRESSION/DISCECTOMY FUSION 1 LEVEL (N/A) - Cervical Five-Six  Anterior Cervical Decompression [name] /Fusion/Plate  Patient Location: PACU  Anesthesia Type:General  Level of Consciousness: awake, alert , oriented and patient cooperative  Airway and Oxygen Therapy: Patient Spontanous Breathing  Post-op Pain: mild  Post-op Assessment: Post-op Vital signs reviewed, Patient's Cardiovascular Status Stable, Respiratory Function Stable, Patent Airway, No signs of Nausea or vomiting and Pain level controlled  Post-op Vital Signs: stable  Complications: No apparent anesthesia complications

## 2012-01-31 NOTE — Progress Notes (Signed)
Op note [phone]

## 2012-01-31 NOTE — Progress Notes (Signed)
Pt BP is 175/79 at this time, Dr.Smith notified, no new orders, will cont to monitor/

## 2012-01-31 NOTE — Progress Notes (Signed)
Awake, moves al 4 extremities, voice normal. Sensory normal . Spoke with his wife. They live in [place] Plan to be discharge tomorrow pm

## 2012-01-31 NOTE — H&P (Signed)
[name] [name] is an 71 y.o. male.   Chief Complaint: neck pain HPI: patient with complains of neck pain with radiation to the shoulders left worse than right, tingling sensation, difficulties to sleep which is not better with conservative treatment.  Past Medical History  Diagnosis Date  . Myocardial infarction 2010  . Complication of anesthesia     severe case of hiccups after hernia surgery;required medication  . Hyperlipidemia     takes Zocor daily  . Hypertension     takes HCTZ,Diovan daily and Isosorbide daily  . Asthma     uses Advair bid and asmanex prn  . Emphysema   . Pneumonia     hx of---12+yrs ago  . Chronic bronchitis     couple of yrs ago  . Dizziness     related to neck issues  . Arthritis     back  . Joint pain   . Chronic back pain     buldging disc and scoliosis  . Neck pain   . Itching     down left arm and leg  . GERD (gastroesophageal reflux disease)     takes PRotonix daily  . History of colon polyps   . Diverticulosis   . Enlarged prostate     takes Flomax daily  . Macular degeneration     mild,beginning    Past Surgical History  Procedure Date  . Hernia repair 2006    x 3   . Salivary gland removed     left   . Vein removed from left leg   . Colonoscopy   . Skin cancer removed from left side of face     basal cell carcinoma  . Cardiac catheterization 2010/2013    06/07/11 [facility]) LAD 25%, D2 75% (small), pLCx 100%, RCA 25%--medical managment     History reviewed. No pertinent family history. Social History:  reports that he has been smoking Pipe.  He does not have any smokeless tobacco history on file. He reports that he drinks alcohol. He reports that he does not use illicit drugs.  Allergies:  Allergies  Allergen Reactions  . Codeine   . Mushroom Extract Complex   . Oxycodone     Medications Prior to Admission  Medication Sig Dispense Refill  . aspirin 325 MG EC tablet Take 325 mg by mouth daily.      Marland Kitchen aspirin EC 81 MG  tablet Take 81 mg by mouth daily.      . carvedilol (COREG) 12.5 MG tablet Take 12.5 mg by mouth 2 (two) times daily with a meal.      . Fluticasone-Salmeterol (ADVAIR) 250-50 MCG/DOSE AEPB Inhale 1 puff into the lungs every 12 (twelve) hours.      . hydrochlorothiazide (MICROZIDE) 12.5 MG capsule Take 12.5 mg by mouth daily.      . isosorbide mononitrate (IMDUR) 30 MG 24 hr tablet Take 30 mg by mouth daily.      . mometasone (ASMANEX) 220 MCG/INH inhaler Inhale 1 puff into the lungs 2 (two) times daily as needed. For asthma      . montelukast (SINGULAIR) 10 MG tablet Take 10 mg by mouth at bedtime.      . pantoprazole (PROTONIX) 40 MG tablet Take 40 mg by mouth every morning.      . simvastatin (ZOCOR) 20 MG tablet Take 20 mg by mouth every evening.      . Tamsulosin HCl (FLOMAX) 0.4 MG CAPS Take 0.4 mg by mouth daily after supper.      Marland Kitchen  traMADol (ULTRAM) 50 MG tablet Take 50 mg by mouth every 6 (six) hours as needed. For pain      . valsartan (DIOVAN) 320 MG tablet Take 320 mg by mouth every morning.      . nitroGLYCERIN (NITROSTAT) 0.4 MG SL tablet Place 0.4 mg under the tongue every 5 (five) minutes as needed. For chest pain        No results found for this or any previous visit (from the past 48 hour(s)). No results found.  Review of Systems  Constitutional: Negative.   HENT: Positive for neck pain.        Dizziness  Eyes: Negative.   Respiratory: Positive for shortness of breath.   Cardiovascular: Negative.        Arterial hypertension  Gastrointestinal: Negative.   Genitourinary: Positive for urgency.  Skin: Negative.   Neurological: Positive for sensory change and focal weakness.  Endo/Heme/Allergies: Negative.   Psychiatric/Behavioral: Negative.     Blood pressure 171/86, pulse 59, temperature 97.5 F (36.4 C), temperature source Oral, resp. rate 18, SpO2 96.00%. Physical Exam hent, nl. Neck, pain with movement. Cv, nl. Lungs, nl. Abdomen, soft. Extremities, nl NEURO  WEAKNESS OF BICEPS AND WRIST EXTENSORS. SENSATION AND DTR, NL.MRI SHOWS A SEVERE STENOSIS AT CERVICAL 56, hnp and compromise of the cord  Assessment/Plan Decompression and fusion at c56. Patient aware of risks and benefits  Olina Melfi M 01/31/2012, 12:02 PM

## 2012-01-31 NOTE — Anesthesia Procedure Notes (Signed)
Procedure Name: Intubation Date/Time: 01/31/2012 4:16 PM Performed by: Jefm Miles E Pre-anesthesia Checklist: Patient identified, Timeout performed, Emergency Drugs available, Suction available and Patient being monitored Patient Re-evaluated:Patient Re-evaluated prior to inductionOxygen Delivery Method: Circle system utilized Preoxygenation: Pre-oxygenation with 100% oxygen Intubation Type: IV induction Ventilation: Mask ventilation without difficulty Laryngoscope Size: Mac and 3 Grade View: Grade I Tube type: Oral Tube size: 7.5 mm Number of attempts: 2 Airway Equipment and Method: Stylet Placement Confirmation: ETT inserted through vocal cords under direct vision,  breath sounds checked- equal and bilateral and positive ETCO2 Secured at: 23 cm Tube secured with: Tape Dental Injury: Teeth and Oropharynx as per pre-operative assessment

## 2012-01-31 NOTE — Progress Notes (Signed)
Orthopedic Tech Progress Note Patient Details:  [name] [name] [birth year] [id number]  Ortho Devices Type of Ortho Device: [place] cervical collar (soft cervical collar) Ortho Device/Splint Location: neck Ortho Device/Splint Interventions: Application   [name] 01/31/2012, 8:11 PM

## 2012-01-31 NOTE — Progress Notes (Signed)
Pt states he "got up with a cold this am.."

## 2012-02-01 DIAGNOSIS — M5412 Radiculopathy, cervical region: Secondary | ICD-10-CM | POA: Diagnosis not present

## 2012-02-01 DIAGNOSIS — M5030 Other cervical disc degeneration, unspecified cervical region: Secondary | ICD-10-CM | POA: Diagnosis not present

## 2012-02-01 DIAGNOSIS — M5020 Other cervical disc displacement, unspecified cervical region: Secondary | ICD-10-CM | POA: Diagnosis not present

## 2012-02-01 NOTE — Progress Notes (Signed)
UR COMPLETED  

## 2012-02-01 NOTE — Evaluation (Signed)
Occupational Therapy Evaluation and Discharge Patient Details Name: [name] [name] MRN: [id number] DOB: March 16, 1940 Today's Date: 02/01/2012 Time: 1308-6578 OT Time Calculation (min): 26 min  OT Assessment / Plan / Recommendation Clinical Impression  This 71 yo male s/p C5-6 fusion presents to acute OT with all education completed. Acute OT will sign off.    OT Assessment  Patient does not need any further OT services    Follow Up Recommendations  No OT follow up       Equipment Recommendations  None recommended by OT          Precautions / Restrictions Precautions Precautions: Cervical Precaution Booklet Issued: Yes (comment) Required Braces or Orthoses: Cervical Brace Cervical Brace: Soft collar;Applied in sitting position Restrictions Weight Bearing Restrictions: No   Pertinent Vitals/Pain No c/o    ADL  Eating/Feeding: Simulated;Modified independent Where Assessed - Eating/Feeding: Edge of bed Grooming: Simulated;Modified independent Where Assessed - Grooming: Unsupported standing Upper Body Bathing: Simulated;Modified independent Where Assessed - Upper Body Bathing: Unsupported sit to stand;Unsupported standing Lower Body Bathing: Simulated;Modified independent Where Assessed - Lower Body Bathing: Unsupported sit to stand Upper Body Dressing: Simulated;Modified independent Where Assessed - Upper Body Dressing: Supported sitting Lower Body Dressing: Simulated;Modified independent Where Assessed - Lower Body Dressing: Unsupported sit to stand Toilet Transfer: Simulated;Modified independent Toilet Transfer Method: Sit to Barista: Comfort height toilet;Grab bars Toileting - Clothing Manipulation and Hygiene: Simulated;Independent Where Assessed - Toileting Clothing Manipulation and Hygiene: Standing Equipment Used:  (cervical collar) Transfers/Ambulation Related to ADLs: Supervison for all ADL Comments: Can cross one leg over the other to  get to his feet for LB ADLs, does not feel he needs a shower seat, is aware he should not raise his arms above his head and should not carry anything heavier than 1/2 gallon of milk (close to his body, not out from his body)      Visit Information  Last OT Received On: 02/01/12 Assistance Needed: +1 PT/OT Co-Evaluation/Treatment: Yes (partial)    Subjective Data  Subjective: I'm doing pretty good now that I am up, I didn't know how I would do Patient Stated Goal: Perhaps home today   Prior Functioning     Home Living Lives With: Spouse Available Help at Discharge: Family;Available 24 hours/day (times 3 weeks) Type of Home: House Home Access: Stairs to enter [employer] of Steps: 1 Entrance Stairs-Rails: None Home Layout: One level Bathroom Shower/Tub: Engineer, manufacturing systems: Standard Bathroom Accessibility: Yes Home Adaptive Equipment: Hand-held shower hose Prior Function Level of Independence: Independent Able to Take Stairs?: Reciprically Driving: Yes Vocation: Full time employment Communication Communication: No difficulties Dominant Hand: Right            Cognition  Overall Cognitive Status: Appears within functional limits for tasks assessed/performed Arousal/Alertness: Awake/alert Orientation Level: Appears intact for tasks assessed Behavior During Session: [facility] for tasks performed    Extremity/Trunk Assessment Right Upper Extremity Assessment RUE ROM/Strength/Tone: Within functional levels Left Upper Extremity Assessment LUE ROM/Strength/Tone: Within functional levels     Mobility Bed Mobility Bed Mobility: Rolling Right;Right Sidelying to Sit;Sitting - Scoot to Edge of Bed Rolling Right: 6: Modified independent (Device/Increase time) Right Sidelying to Sit: 6: Modified independent (Device/Increase time);HOB flat Sitting - Scoot to Edge of Bed: 6: Modified independent (Device/Increase time) Transfers Transfers: Sit to Stand;Stand  to Sit Sit to Stand: 7: Independent;With upper extremity assist;From bed Stand to Sit: 7: Independent;With upper extremity assist;With armrests;To chair/3-in-1  End of Session OT - End of Session Equipment Utilized During Treatment: Gait belt;Cervical collar Activity Tolerance: Patient tolerated treatment well Patient left: in chair Nurse Communication: Mobility status (NT--needs someone to walk with him around the unit)       Evette Georges [phone] 02/01/2012, 10:48 AM

## 2012-02-01 NOTE — Discharge Summary (Signed)
Physician Discharge Summary  Patient ID: [name] [name] MRN: [id number] DOB/AGE: 05/28/1940 71 y.o.  Admit date: 01/31/2012 Discharge date: 02/01/2012  Admission Diagnoses:c5-6 stenosis. myelopathy  Discharge Diagnoses: same   Discharged Condition: no pain  Hospital Course: surgery  Consults: none  Significant Diagnostic Studies: mri  Treatments:cervical fusion  Discharge Exam: Blood pressure 149/79, pulse 75, temperature 97.5 F (36.4 C), temperature source Oral, resp. rate 19, height 6' (1.829 m), weight 87.1 kg (192 lb 0.3 oz), SpO2 97.00%. No weakness  Disposition: home     Medication List     As of 02/01/2012  1:13 PM    ASK your doctor about these medications         aspirin EC 81 MG tablet   Take 81 mg by mouth daily.      aspirin 325 MG EC tablet   Take 325 mg by mouth daily.      carvedilol 12.5 MG tablet   Commonly known as: COREG   Take 12.5 mg by mouth 2 (two) times daily with a meal.      Fluticasone-Salmeterol 250-50 MCG/DOSE Aepb   Commonly known as: ADVAIR   Inhale 1 puff into the lungs every 12 (twelve) hours.      hydrochlorothiazide 12.5 MG capsule   Commonly known as: MICROZIDE   Take 12.5 mg by mouth daily.      isosorbide mononitrate 30 MG 24 hr tablet   Commonly known as: IMDUR   Take 30 mg by mouth daily.      mometasone 220 MCG/INH inhaler   Commonly known as: ASMANEX   Inhale 1 puff into the lungs 2 (two) times daily as needed. For asthma      montelukast 10 MG tablet   Commonly known as: SINGULAIR   Take 10 mg by mouth at bedtime.      nitroGLYCERIN 0.4 MG SL tablet   Commonly known as: NITROSTAT   Place 0.4 mg under the tongue every 5 (five) minutes as needed. For chest pain      pantoprazole 40 MG tablet   Commonly known as: PROTONIX   Take 40 mg by mouth every morning.      simvastatin 20 MG tablet   Commonly known as: ZOCOR   Take 20 mg by mouth every evening.      Tamsulosin HCl 0.4 MG Caps   Commonly  known as: FLOMAX   Take 0.4 mg by mouth daily after supper.      traMADol 50 MG tablet   Commonly known as: ULTRAM   Take 50 mg by mouth every 6 (six) hours as needed. For pain      valsartan 320 MG tablet   Commonly known as: DIOVAN   Take 320 mg by mouth every morning.         Signed: Karn Cassis 02/01/2012, 1:13 PM

## 2012-02-01 NOTE — Op Note (Signed)
[name], [name]             ACCOUNT NO.:  [id number]  MEDICAL RECORD NO.:  [id number]  LOCATION:  [id number]                        FACILITY:  [facility]  PHYSICIAN:  Hilda Lias, M.D.   DATE OF BIRTH:  1941-01-15  DATE OF PROCEDURE:  01/31/2012 DATE OF DISCHARGE:                              OPERATIVE REPORT   PREOPERATIVE DIAGNOSES:  C5-C6 degenerative disk disease with central herniated disk with myelopathy and radiculopathy.  POSTOPERATIVE DIAGNOSIS:  C5-C6 degenerative disk disease with central herniated disk with myelopathy and radiculopathy.  PROCEDURE:  Anterior C5-C6 diskectomy, decompression of the spinal cord, removal of calcified herniated disk, decompression of the C6 nerve root, lysis of adhesion, interbody fusion with cage, plate, and microscope.  SURGEON:  Hilda Lias, M.D.  ASSISTANT:  Kathaleen Maser. Pool, M.D.  CLINICAL HISTORY:  [name] [name] is a 70 year old gentleman who came to my office with complaints of neck pain with weakness in the upper extremity with pain mostly to the left, but problem with balance.  On physical examination, I found that this gentleman was unable to walk in a straight line with weakness of the biceps and wrist extensors.  The MRI showed severe degenerative disk disease at C5-C6 with a central herniated disk with myelopathy and occlusion of the canal with foraminal narrow.  Surgery was advised.  He knew the risk of the surgery including the possibility of paralysis, no improvement whatsoever, need of further surgery, failure of the graft.  PROCEDURE IN DETAIL:  The patient was taken to the OR, and after intubation, the left side of the neck was cleaned with DuraPrep.  Drapes were applied.  Transverse incision was made through the skin and subcutaneous tissue, and straight to the cervical spine.  We found a large osteophyte, which improved on the left over the area at C5-C6. The osteophyte was removed.  We brought immediately the  microscope. Using the microcurette as well as the drill, we were able to go all the way down posterior.  The patient had quite a bit of calcification and degenerative disk disease at this level.  We were unable to open the posterior ligament in the midline.  We had to go all the way laterally and worked our way between the nerve root and the spinal cord.  Incision was made using the Kerrison punch, first 1 mm and later on 2.  We were able to decompress the spinal cord.  The patient had quite a bit of calcified disk, mostly going into the body of C5.  Then, we worked our way, and we decompressed the left C6 nerve root.  On the right one, the patient had quite a bit of adhesion.  There was a calcified disk right at the level of the axilla.  Upon removal, there was a small amount of arachnoid pouch.  Nevertheless, we continued our dissection until we had good decompression and plenty of room for the C6 as well as the spinal cord.  We probed below the body of C6 and the upper part of C5, and there was no more disk.  Then, a cage of 8 mm with autograft and bone extension was inserted followed by a plate with four screws.  The  four screws because the bone was quite osteopenic.  We had to be directing in different directions to get the good hold.  From then on, the area was irrigated.  Hemostasis was done with bipolar.  We waited 10 minutes and after that, we closed the wound with Vicryl and Steri-Strips.          ______________________________ Hilda Lias, M.D.     EB/MEDQ  D:  01/31/2012  T:  02/01/2012  Job:  [id number]

## 2012-02-01 NOTE — Clinical Social Work Note (Signed)
Clinical Social Work   CSW reviewed chart and PT is not recommending any follow up. CSW updated RNCM. CSW is signing off at this time, as no further needs identified. Please reconsult if a need arises prior to discharge.   Dede Query, MSW, Theres[facility] Majors [phone]

## 2012-02-01 NOTE — Clinical Social Work Note (Signed)
Clinical Social Work   CSW received a consult for SNF. CSW received chart and discussed pt with RN during progression. Awaiting PT/OT evals for discharge recommendations. CSW will assess for SNF, if appropriate. Please call with any urgent needs. CSW will continue to follow.   Dede Query, MSW, Theresia Majors [phone]

## 2012-02-01 NOTE — Evaluation (Signed)
Physical Therapy Evaluation Patient Details Name: [name] [name] MRN: [id number] DOB: 1940-04-23 Today's Date: 02/01/2012 Time: 4098-1191 PT Time Calculation (min): 16 min  PT Assessment / Plan / Recommendation Clinical Impression  Patient is independent for functional mobility. Patient was able to perform stair negotiation without difficulty.  Patient does not need further PT at this time.     PT Assessment  Patent does not need any further PT services    Follow Up Recommendations  No PT follow up          Equipment Recommendations  None recommended by PT          Precautions / Restrictions Precautions Precautions: Cervical Precaution Booklet Issued: Yes (comment) Required Braces or Orthoses: Cervical Brace Cervical Brace: Soft collar;Applied in sitting position Restrictions Weight Bearing Restrictions: No   Pertinent Vitals/Pain Minimal sore throat pain reported      Mobility  Bed Mobility Bed Mobility: Rolling Right;Right Sidelying to Sit;Sitting - Scoot to Edge of Bed Rolling Right: 6: Modified independent (Device/Increase time) Right Sidelying to Sit: 6: Modified independent (Device/Increase time);HOB flat Sitting - Scoot to Edge of Bed: 6: Modified independent (Device/Increase time) Transfers Transfers: Sit to Stand;Stand to Sit Sit to Stand: 7: Independent;With upper extremity assist;From bed Stand to Sit: 7: Independent;With upper extremity assist;With armrests;To chair/3-in-1 Ambulation/Gait Ambulation/Gait Assistance: 7: Independent Ambulation Distance (Feet): 220 Feet Assistive device: None Ambulation/Gait Assistance Details: No assist required Gait Pattern: Within Functional Limits Gait velocity: WFL General Gait Details: patient steady with ambulation Stairs: Yes Stairs Assistance: 6: Modified independent (Device/Increase time) Stair Management Technique: No rails;One rail Right Number of Stairs: 5  (two trials of 5; 5 with rail 5 without rail)         Visit Information  Last PT Received On: 02/01/12 Assistance Needed: +1    Subjective Data  Subjective: I feel pretty good Patient Stated Goal: to go home   Prior Functioning  Home Living Lives With: Spouse Available Help at Discharge: Family;Available 24 hours/day Type of Home: House Home Access: Stairs to enter [employer] of Steps: 1 Entrance Stairs-Rails: None Home Layout: One level Bathroom Shower/Tub: Engineer, manufacturing systems: Standard Bathroom Accessibility: Yes Home Adaptive Equipment: Hand-held shower hose Prior Function Level of Independence: Independent Able to Take Stairs?: Reciprically Driving: Yes Vocation: Full time employment Communication Communication: No difficulties Dominant Hand: Right    Cognition  Overall Cognitive Status: Appears within functional limits for tasks assessed/performed Arousal/Alertness: Awake/alert Orientation Level: Appears intact for tasks assessed Behavior During Session: [facility] for tasks performed    Extremity/Trunk Assessment Right Upper Extremity Assessment RUE ROM/Strength/Tone: Within functional levels Left Upper Extremity Assessment LUE ROM/Strength/Tone: Within functional levels Right Lower Extremity Assessment RLE ROM/Strength/Tone: WFL for tasks assessed Left Lower Extremity Assessment LLE ROM/Strength/Tone: WFL for tasks assessed   Balance Balance Balance Assessed: Yes High Level Balance High Level Balance Activites: Side stepping;Backward walking;Direction changes;Turns;Sudden stops;Head turns High Level Balance Comments: WFL  End of Session PT - End of Session Equipment Utilized During Treatment: Gait belt;Cervical collar Activity Tolerance: Patient tolerated treatment well Patient left: in chair;with family/visitor present;with call bell/phone within reach (with OT) Nurse Communication: Mobility status  GP     Fabio Asa 02/01/2012, 10:52 AM  Charlotte Crumb, PT DPT   [phone]

## 2012-02-02 ENCOUNTER — Encounter (HOSPITAL_COMMUNITY): Payer: Self-pay | Admitting: Neurosurgery

## 2012-02-13 DIAGNOSIS — M5030 Other cervical disc degeneration, unspecified cervical region: Secondary | ICD-10-CM | POA: Diagnosis not present

## 2012-03-05 DIAGNOSIS — M5030 Other cervical disc degeneration, unspecified cervical region: Secondary | ICD-10-CM | POA: Diagnosis not present

## 2012-03-15 DIAGNOSIS — L821 Other seborrheic keratosis: Secondary | ICD-10-CM | POA: Diagnosis not present

## 2012-03-15 DIAGNOSIS — Z85828 Personal history of other malignant neoplasm of skin: Secondary | ICD-10-CM | POA: Diagnosis not present

## 2012-03-15 DIAGNOSIS — L578 Other skin changes due to chronic exposure to nonionizing radiation: Secondary | ICD-10-CM | POA: Diagnosis not present

## 2012-03-15 DIAGNOSIS — L819 Disorder of pigmentation, unspecified: Secondary | ICD-10-CM | POA: Diagnosis not present

## 2012-04-23 DIAGNOSIS — M5030 Other cervical disc degeneration, unspecified cervical region: Secondary | ICD-10-CM | POA: Diagnosis not present

## 2012-04-25 ENCOUNTER — Other Ambulatory Visit: Payer: Self-pay | Admitting: Neurosurgery

## 2012-04-25 DIAGNOSIS — R2689 Other abnormalities of gait and mobility: Secondary | ICD-10-CM

## 2012-05-04 ENCOUNTER — Ambulatory Visit
Admission: RE | Admit: 2012-05-04 | Discharge: 2012-05-04 | Disposition: A | Discharge status: Home / Self Care | Payer: Medicare Other | Source: Ambulatory Visit | Attending: Neurosurgery | Admitting: Neurosurgery

## 2012-05-04 DIAGNOSIS — R209 Unspecified disturbances of skin sensation: Secondary | ICD-10-CM | POA: Diagnosis not present

## 2012-05-04 DIAGNOSIS — R2689 Other abnormalities of gait and mobility: Secondary | ICD-10-CM

## 2012-05-04 DIAGNOSIS — R269 Unspecified abnormalities of gait and mobility: Secondary | ICD-10-CM | POA: Diagnosis not present

## 2012-05-04 MED ORDER — GADOBENATE DIMEGLUMINE 529 MG/ML IV SOLN
17.0000 mL | Freq: Once | INTRAVENOUS | Status: AC | PRN
  Administered 2012-05-04: 17 mL via INTRAVENOUS

## 2012-06-20 ENCOUNTER — Telehealth: Payer: Self-pay | Admitting: Neurology

## 2012-06-20 ENCOUNTER — Encounter: Payer: Self-pay | Admitting: Neurology

## 2012-06-20 ENCOUNTER — Ambulatory Visit (INDEPENDENT_AMBULATORY_CARE_PROVIDER_SITE_OTHER): Payer: Medicare Other | Admitting: Neurology

## 2012-06-20 VITALS — BP 156/87 | HR 57 | Temp 97.6°F | Ht 72.0 in | Wt 189.0 lb

## 2012-06-20 DIAGNOSIS — R209 Unspecified disturbances of skin sensation: Secondary | ICD-10-CM | POA: Diagnosis not present

## 2012-06-20 DIAGNOSIS — R202 Paresthesia of skin: Secondary | ICD-10-CM

## 2012-06-20 DIAGNOSIS — R200 Anesthesia of skin: Secondary | ICD-10-CM

## 2012-06-20 DIAGNOSIS — R51 Headache: Secondary | ICD-10-CM

## 2012-06-20 DIAGNOSIS — M79609 Pain in unspecified limb: Secondary | ICD-10-CM | POA: Diagnosis not present

## 2012-06-20 DIAGNOSIS — R519 Headache, unspecified: Secondary | ICD-10-CM

## 2012-06-20 HISTORY — DX: Pain in unspecified limb: M79.609

## 2012-06-20 MED ORDER — GABAPENTIN 100 MG PO CAPS: 100.0000 mg | ORAL_CAPSULE | Freq: Every day | ORAL | Status: DC

## 2012-06-20 NOTE — Patient Instructions (Addendum)
I want to suggest a few things today:   I will order a EMG/nerve conduction test, that is an electrical test on nerves and muscles of your L arm. We will do a C spine MRI with and without contrast. Trial of low dose gabapentin, 100 mg at night and take Flexeril at bedtime. Try to reduce the oxycodone. Call us with the dose of your gabapentin at home and the other medication you took.  Remember to drink plenty of fluid, eat healthy meals and do not skip any meals. Try to eat protein with a every meal and eat a healthy snack such as fruit or nuts in between meals. Try to keep a regular sleep-wake schedule and try to exercise daily, particularly in the form of walking, 20-30 minutes a day, if you can.   I would like to see you back in 3 months, sooner if we need to. Please call us with any interim questions, concerns, problems, updates or refill requests.  Please also call us for any test results so we can go over those with you on the phone. Brett Canales is my clinical assistant and will answer any of your questions and relay your messages to me and also relay most of my messages to you.  Our phone number is [phone]. We also have an after hours call service for urgent matters and there is a physician on-call for urgent questions. For any emergencies you know to call 911 or go to the nearest emergency room.

## 2012-06-20 NOTE — Progress Notes (Signed)
Subjective:    Patient ID: [name] [name] is a 72 y.o. male.  HPI  [name] Foley, MD, PhD [facility] [address], [address] [address] [place], [place] [address]  Dear Dr. Jeral Fruit,   I saw your patient, [name] [name], upon your kind request in my neurologic clinic today for initial consultation of his facial and upper body numbness on the left side. The patient is accompanied by his wife today. As you know, [name] is a very pleasant 72 year old right-handed gentleman with an underlying medical history of hypertension, reflux disease, hyperlipidemia, heart disease, and chronic lung disease, who has been experiencing left-sided facial numbness left-sided neck numbness as well as shoulder and arm and chest wall numbness on the left side for the past 2-3 months. Sx really started about a month after his neck surgery on 01/31/12 and are similar to the pain he had, but now feels more muscular and he has restarted his Flexeril. This helps some. He has noticed in addition to this problems with his balance. He fell in February and experienced worsening left shoulder pain, he actually did not hit the floor he states. He he says that he tripped and landed on his knees. He had flexion-extension C-spine x-rays which he felt were unchanged. You ordered a brain MRI with and without contrast in the patient had the study on 05/04/2012 which I reviewed. There was no evidence of acute infarction, intracranial hemorrhage, or mass lesion and no enhancement post contrast. The impression was mild atrophy with chronic microvascular ischemic changes and no acute findings. The patient reports no problems with bowel or bladder function. He reports no recurrent headaches. He reports no slurring of speech or facial droop. He has never had sudden onset of one-sided weakness, numbness, diplopia, monocular vision loss, tinnitus or hearing loss or sudden staggering gait or recurrent falls. His current  medications are Diovan, hydrochlorothiazide, pantoprazole, carvedilol, isosorbide, aspirin, tamsulosin, simvastatin, Montelucast, fluticasone, oxycodone as needed, nitroglycerin as needed, Asmanex as needed, Advair.  He reports numbness in his L hand and subjective hand weakness as well as itching behind his left ear and in the lateral aspect of his neck and down the left jawline. He has had shingles before but this affected his back on the left below his shoulder blade area.  I explained his MRI results to him and pointed out that these are in essence age-appropriate changes.  His Past Medical History Is Significant For: Past Medical History  Diagnosis Date  . Myocardial infarction 2010  . Complication of anesthesia     severe case of hiccups after hernia surgery;required medication  . Hyperlipidemia     takes Zocor daily  . Hypertension     takes HCTZ,Diovan daily and Isosorbide daily  . Asthma     uses Advair bid and asmanex prn  . Emphysema   . Pneumonia     hx of---12+yrs ago  . Chronic bronchitis     couple of yrs ago  . Dizziness     related to neck issues  . Arthritis     back  . Joint pain   . Chronic back pain     buldging disc and scoliosis  . Neck pain   . Itching     down left arm and leg  . GERD (gastroesophageal reflux disease)     takes PRotonix daily  . History of colon polyps   . Diverticulosis   . Enlarged prostate     takes Flomax daily  .  Macular degeneration     mild,beginning    His Past Surgical History Is Significant For: Past Surgical History  Procedure Laterality Date  . Hernia repair  2006    x 3   . Salivary gland removed      left   . Vein removed from left leg    . Colonoscopy    . Skin cancer removed from left side of face      basal cell carcinoma  . Cardiac catheterization  2010/2013    06/07/11 [facility]) LAD 25%, D2 75% (small), pLCx 100%, RCA 25%--medical managment   . Anterior cervical decomp/discectomy fusion  01/31/2012     Procedure: ANTERIOR CERVICAL DECOMPRESSION/DISCECTOMY FUSION 1 LEVEL;  Surgeon: Karn Cassis, MD;  Location: [facility]UR[facility];  Service: Neurosurgery;  Laterality: N/A;  Cervical Five-Six  Anterior Cervical Decompression /Diskectomy /Fusion/Plate    His Family History Is Significant For: Family History  Problem Relation Age of Onset  . Cancer Sister   . Cancer Sister     His Social History Is Significant For: History   Social History  . Marital Status: Married    Spouse Name: N/A    Number of Children: N/A  . Years of Education: N/A   Social History Main Topics  . Smoking status: Current Some Day Smoker    Types: Pipe  . Smokeless tobacco: Not on file  . Alcohol Use: Yes     Comment: 1-2 glasses of wine daily  . Drug Use: No  . Sexually Active: Yes   Other Topics Concern  . Not on file   Social History Narrative  . No narrative on file    His Allergies Are:  Allergies  Allergen Reactions  . Codeine Itching  . Mushroom Extract Complex Nausea And Vomiting    Only a certain type of mushroom (name unknown) causes this reaction.  :   His Current Medications Are:  Outpatient Encounter Prescriptions as of 06/20/2012  Medication Sig Dispense Refill  . aspirin 325 MG tablet Take 325 mg by mouth daily.      . carvedilol (COREG) 12.5 MG tablet Take 12.5 mg by mouth 2 (two) times daily with a meal.      . fluticasone (FLONASE) 50 MCG/ACT nasal spray Place 2 sprays into the nose daily.      . Fluticasone-Salmeterol (ADVAIR) 250-50 MCG/DOSE AEPB Inhale 1 puff into the lungs every 12 (twelve) hours.      . hydrochlorothiazide (MICROZIDE) 12.5 MG capsule Take 12.5 mg by mouth daily.      . isosorbide mononitrate (IMDUR) 30 MG 24 hr tablet Take 30 mg by mouth 2 (two) times daily.      . mometasone (ASMANEX) 220 MCG/INH inhaler Inhale 1 puff into the lungs 2 (two) times daily as needed. For asthma      . nitroGLYCERIN (NITROSTAT) 0.4 MG SL tablet Place 0.4 mg under the tongue every 5  (five) minutes as needed. For chest pain      . oxyCODONE-acetaminophen (PERCOCET) 7.5-325 MG per tablet Take 1 tablet by mouth every 4 (four) hours as needed for pain.      . pantoprazole (PROTONIX) 40 MG tablet Take 40 mg by mouth every morning.      . simvastatin (ZOCOR) 20 MG tablet Take 20 mg by mouth every evening.      . Tamsulosin HCl (FLOMAX) 0.4 MG CAPS Take 0.4 mg by mouth daily after supper.      . valsartan (DIOVAN) 320 MG  tablet Take 320 mg by mouth every morning.      . gabapentin (NEURONTIN) 100 MG capsule Take 1 capsule (100 mg total) by mouth at bedtime.  30 capsule  3  . [DISCONTINUED] montelukast (SINGULAIR) 10 MG tablet Take 10 mg by mouth at bedtime.      . [DISCONTINUED] traMADol (ULTRAM) 50 MG tablet Take 50 mg by mouth every 6 (six) hours as needed. For pain       No facility-administered encounter medications on file as of 06/20/2012.  : Review of Systems  Neurological: Positive for dizziness, weakness and numbness.    Objective:  Neurologic Exam  Physical Exam Physical Examination:   Filed Vitals:   06/20/12 0959  BP: 156/87  Pulse: 57  Temp: 97.6 F (36.4 C)    General Examination: The patient is a very pleasant 72 y.o. male in no acute distress. He appears well-developed and well-nourished and very well groomed.   HEENT: Normocephalic, atraumatic, pupils are equal, round and reactive to light and accommodation. Funduscopic exam is normal with sharp disc margins noted. Extraocular tracking is good without limitation to gaze excursion or nystagmus noted. Normal smooth pursuit is noted. Hearing is grossly intact. Tympanic membranes were not visualized because of impacted cerumen bilaterally. Face is symmetric with normal facial animation and normal facial sensation, he has no vesicular rash. Speech is clear with no dysarthria noted. There is no hypophonia. There is no lip, neck/head, jaw or voice tremor. Neck is supple with full range of passive and active  motion. There are no carotid bruits on auscultation. Oropharynx exam reveals: good dental hygiene and mild airway crowding, due to narrow airway and elongated uvula. Mallampati is class II. Tongue protrudes centrally and palate elevates symmetrically. Tonsils are 1+.   Chest: Clear to auscultation without wheezing, rhonchi or crackles noted.  Heart: S1+S2+0, regular and normal without murmurs, rubs or gallops noted.   Abdomen: Soft, non-tender and non-distended with normal bowel sounds appreciated on auscultation.  Extremities: There is no pitting edema in the distal lower extremities bilaterally.   Skin: Warm and dry without trophic changes noted. There are no varicose veins.  Musculoskeletal: exam reveals no obvious joint deformities, tenderness or joint swelling or erythema.   Neurologically:  Mental status: The patient is awake, alert and oriented in all 4 spheres. His memory, attention, language and knowledge are appropriate. There is no aphasia, agnosia, apraxia or anomia. Speech is clear with normal prosody and enunciation. Thought process is linear. Mood is congruent and affect is normal.  Cranial nerves are as described above under HEENT exam. In addition, shoulder shrug is normal with equal shoulder height noted. Motor exam: Normal bulk, strength and tone is noted. In particular there is no atrophy of his hand muscles and intrinsic hand muscle strength is normal. There is no drift, tremor or rebound. Romberg is negative. Reflexes are 2+ throughout. Toes are downgoing bilaterally. Fine motor skills are intact with normal finger taps, normal hand movements, normal rapid alternating patting, normal foot taps and normal foot agility.  Cerebellar testing shows no dysmetria or intention tremor on finger to nose testing. Heel to shin is unremarkable bilaterally. There is no truncal or gait ataxia.  Sensory exam is intact to light touch, pinprick, vibration, temperature sense and proprioception  in the upper and lower extremities, with the exception of mild decrease in vibration sense and pinprick sensation in the left hand in the dorsal and volar aspect and in the volar aspect of his right  hand he has decreased pinprick sensation.  Gait, station and balance are unremarkable. No veering to one side is noted. No leaning to one side is noted. Posture is age-appropriate and stance is narrow based. No problems turning are noted. He turns en bloc.          Assessment and Plan:   Assessment and Plan:  In summary, Franklyn Cafaro is a very pleasant 72 y.o.-year old male with a history of neck pain and paresthesias and numbness affecting primarily his left upper extremity but also his left jawline and left lateral neck area.. His physical exam is actually fairly benign with the exception of mild decrease in sensation in his left hand primarily. He had a recent brain MRI which showed age-appropriate findings and explain this to him. While his left arm symptoms may be stemming from the neck area, his facial symptoms are not explained by this. I would like to have him take Flexeril more regularly and to reduce his oxycodone use as much is possible. I would like to order an EMG nerve conduction test of his left upper extremity as well as a C-spine MRI with contrast to look if there is any evidence of plexitis. For symptomatic relief we can also consider low-dose Neurontin at 100 mg at bedtime. He states that he tried Neurontin before but he was groggy with that. I've asked him to look at home with her this was a higher dose perhaps. I will see him back in 3 months from now, sooner if the need arises and we will be calling him with his test results as they come in.   Thank you very much for allowing me to participate in the care of this nice patient. If I can be of any further assistance to you please do not hesitate to call me at [phone].  Sincerely,   [name] Foley, MD, PhD

## 2012-06-22 ENCOUNTER — Ambulatory Visit (INDEPENDENT_AMBULATORY_CARE_PROVIDER_SITE_OTHER): Payer: Medicare Other | Admitting: Neurology

## 2012-06-22 ENCOUNTER — Encounter (INDEPENDENT_AMBULATORY_CARE_PROVIDER_SITE_OTHER): Payer: Medicare Other

## 2012-06-22 DIAGNOSIS — K5900 Constipation, unspecified: Secondary | ICD-10-CM | POA: Diagnosis not present

## 2012-06-22 DIAGNOSIS — G542 Cervical root disorders, not elsewhere classified: Secondary | ICD-10-CM

## 2012-06-22 DIAGNOSIS — R519 Headache, unspecified: Secondary | ICD-10-CM

## 2012-06-22 DIAGNOSIS — M47812 Spondylosis without myelopathy or radiculopathy, cervical region: Secondary | ICD-10-CM | POA: Diagnosis not present

## 2012-06-22 DIAGNOSIS — R200 Anesthesia of skin: Secondary | ICD-10-CM

## 2012-06-22 DIAGNOSIS — M79609 Pain in unspecified limb: Secondary | ICD-10-CM | POA: Diagnosis not present

## 2012-06-22 DIAGNOSIS — I10 Essential (primary) hypertension: Secondary | ICD-10-CM | POA: Diagnosis not present

## 2012-06-22 DIAGNOSIS — R209 Unspecified disturbances of skin sensation: Secondary | ICD-10-CM | POA: Diagnosis not present

## 2012-06-22 DIAGNOSIS — K219 Gastro-esophageal reflux disease without esophagitis: Secondary | ICD-10-CM | POA: Diagnosis not present

## 2012-06-22 DIAGNOSIS — Z0289 Encounter for other administrative examinations: Secondary | ICD-10-CM

## 2012-06-22 DIAGNOSIS — I259 Chronic ischemic heart disease, unspecified: Secondary | ICD-10-CM | POA: Diagnosis not present

## 2012-06-22 DIAGNOSIS — N400 Enlarged prostate without lower urinary tract symptoms: Secondary | ICD-10-CM | POA: Diagnosis not present

## 2012-06-22 NOTE — Procedures (Signed)
  HISTORY:  [name] [name] is a 72 year old gentleman with a history of cervical spine surgery in December of 2013. The patient gained good improvement with his surgery for about one month, and then he began having some left-sided neck and shoulder discomfort, and the sensation of weakness or fatigue of the left arm. The patient has a tingly sensation in the left shoulder as well. The patient is being evaluated for these symptoms.  NERVE CONDUCTION STUDIES:  Nerve conduction studies were performed on both upper extremities. The distal motor latencies and motor amplitudes for the median and ulnar nerves were within normal limits. The F wave latencies and nerve conduction velocities for these nerves were also normal. The sensory latencies for the median and ulnar nerves were normal.    EMG STUDIES:  EMG study was performed on the left upper extremity:  The first dorsal interosseous muscle reveals 2 to 4 K units with full recruitment. No fibrillations or positive waves were noted. The abductor pollicis brevis muscle reveals 2 to 4 K units with full recruitment. No fibrillations or positive waves were noted. The extensor indicis proprius muscle reveals 1 to 3 K units with full recruitment. No fibrillations or positive waves were noted. The pronator teres muscle reveals 2 to 3 K units with full recruitment. No fibrillations or positive waves were noted. The biceps muscle reveals 1 to 2 K units with full recruitment. No fibrillations or positive waves were noted. The triceps muscle reveals 2 to 4 K units with full recruitment. No fibrillations or positive waves were noted. The anterior deltoid muscle reveals 2 to 3 K units with full recruitment. No fibrillations or positive waves were noted. The supraspinatus muscle reveals 2 to 3K units with full recruitment. No fibrillations or positive waves were seen. The cervical paraspinal muscles were tested at 2 levels. No abnormalities of insertional  activity were seen at the lower level tested. 2+ fibrillations and positive waves were seen at the upper level. There was fair relaxation.    IMPRESSION:  Nerve conduction studies done on both upper extremities is unremarkable, without evidence of a neuropathy. EMG evaluation of the left upper extremity was essentially normal, but there was evidence of isolated acute denervation in the upper cervical paraspinal muscles. This study would be consistent with a cervical radiculopathy of indeterminate level, but the findings could be a residual from the recent cervical spine surgery. Clinical correlation is required.  Marlan Palau MD 06/22/2012 1:45 PM  [facility] [address] [place], [place] [address]  Phone [phone] Fax [phone]

## 2012-06-26 NOTE — Progress Notes (Signed)
Quick Note:  Please call and advise the patient that the recent electrical nerve and muscle test we did, also known as EMG and nerve conduction velocity test, was fairly benign: while the nerve testing was reported as normal, the muscle testing showed minor abnormalities in the upper neck muscles, consistent with neck nerve root problems, but the findings could be a residual from the recent cervical spine surgery. Therefore, I believe, we need to watch and wait and perhaps repeat the nerve and muscle testing down the road, perhaps in 3-6 months from now. For now, no further action is required. Please remind patient to keep any upcoming appointments and to call us with any interim questions, concerns, problems or updates. Thanks,  Huston Foley, MD, PhD    ______

## 2012-06-27 ENCOUNTER — Ambulatory Visit
Admission: RE | Admit: 2012-06-27 | Discharge: 2012-06-27 | Disposition: A | Discharge status: Home / Self Care | Payer: Medicare Other | Source: Ambulatory Visit | Attending: Neurology | Admitting: Neurology

## 2012-06-27 ENCOUNTER — Telehealth: Payer: Self-pay | Admitting: Neurology

## 2012-06-27 DIAGNOSIS — R519 Headache, unspecified: Secondary | ICD-10-CM

## 2012-06-27 DIAGNOSIS — M79609 Pain in unspecified limb: Secondary | ICD-10-CM

## 2012-06-27 DIAGNOSIS — R202 Paresthesia of skin: Secondary | ICD-10-CM

## 2012-06-27 DIAGNOSIS — R209 Unspecified disturbances of skin sensation: Secondary | ICD-10-CM | POA: Diagnosis not present

## 2012-06-27 DIAGNOSIS — R200 Anesthesia of skin: Secondary | ICD-10-CM

## 2012-06-27 DIAGNOSIS — R51 Headache: Secondary | ICD-10-CM | POA: Diagnosis not present

## 2012-06-27 MED ORDER — GADOBENATE DIMEGLUMINE 529 MG/ML IV SOLN
18.0000 mL | Freq: Once | INTRAVENOUS | Status: AC | PRN
  Administered 2012-06-27: 18 mL via INTRAVENOUS

## 2012-06-27 NOTE — Progress Notes (Signed)
Quick Note:  Spoke with patient and relayed the results of their EMG as well as Dr Teofilo Pod advise or recommendations. The patient was also reminded of any future appointments. The patient understood and stated that he is still having considerable pain and would like to know if increasing his Gabapentin Rx (100 mg to 300 mg) would be helpful. ______

## 2012-06-27 NOTE — Telephone Encounter (Signed)
Please advise the patient that he can gradually increase to 2 pills of gabapentin which would be 200 mg at night and then after a week or 2 to 300 mg at night. Please remind him, though that in the past when he took gabapentin he became too sleepy and groggy on it which is why a started him on a low dose. thx

## 2012-06-28 NOTE — Telephone Encounter (Signed)
Spoke with patient and relayed Dr Teofilo Pod instructions.  Patient understood and had no questions.

## 2012-07-03 NOTE — Progress Notes (Signed)
Quick Note:  Please call and advise the patient that his recent MRI of the neck with and without contrast did not show any problems with his spinal cord but there is significant degenerative spine disease. It may be a good idea to make a followup appointment with his neurosurgeon and take a copy of the films with him to discuss the findings. If he agrees we can make a copy on a CT for him for pick up and he can see Dr. Jeral Fruit by calling his office for an appointment. Thanks, Huston Foley, MD, PhD [facility] ([facility]) ______

## 2012-07-06 ENCOUNTER — Telehealth: Payer: Self-pay | Admitting: Neurology

## 2012-07-06 NOTE — Telephone Encounter (Signed)
I spoke to patient and gave him MRI results, per Dr. Frances Furbish.  The patient agreed with her advice to see neurosurgeon and would like the MRI put on disk to take with him when he sees Dr. Jeral Fruit.  I told him I would check to see who how we get that done and call him when CD is ready.

## 2012-07-06 NOTE — Telephone Encounter (Signed)
See note below, patient called with results.

## 2012-07-06 NOTE — Progress Notes (Signed)
Quick Note:  Spoke with patient and relayed the results of their MRI as well as Dr Teofilo Pod advise or recommendations. The patient was also reminded of any future appointments. The patient understood and had no questions. A copy of has been faxed to his neurosurgeon, Dr Viviano Simas. Mr [name] [name] will be stopping by [facility] to pick up a copy of the MRI cd.   ______

## 2012-07-11 ENCOUNTER — Other Ambulatory Visit: Payer: Self-pay | Admitting: Neurology

## 2012-07-11 DIAGNOSIS — M79609 Pain in unspecified limb: Secondary | ICD-10-CM

## 2012-08-23 ENCOUNTER — Other Ambulatory Visit: Payer: Self-pay | Admitting: Neurosurgery

## 2012-08-23 DIAGNOSIS — M542 Cervicalgia: Secondary | ICD-10-CM

## 2012-08-23 DIAGNOSIS — M5412 Radiculopathy, cervical region: Secondary | ICD-10-CM | POA: Diagnosis not present

## 2012-08-24 ENCOUNTER — Ambulatory Visit
Admission: RE | Admit: 2012-08-24 | Discharge: 2012-08-24 | Disposition: A | Discharge status: Home / Self Care | Payer: Medicare Other | Source: Ambulatory Visit | Attending: Neurosurgery | Admitting: Neurosurgery

## 2012-08-24 DIAGNOSIS — M5020 Other cervical disc displacement, unspecified cervical region: Secondary | ICD-10-CM | POA: Diagnosis not present

## 2012-08-24 DIAGNOSIS — M542 Cervicalgia: Secondary | ICD-10-CM

## 2012-08-24 MED ORDER — TRIAMCINOLONE ACETONIDE 40 MG/ML IJ SUSP (RADIOLOGY)
60.0000 mg | Freq: Once | INTRAMUSCULAR | Status: AC
  Administered 2012-08-24: 60 mg via EPIDURAL

## 2012-08-24 MED ORDER — IOHEXOL 300 MG/ML  SOLN
1.0000 mL | Freq: Once | INTRAMUSCULAR | Status: AC | PRN
  Administered 2012-08-24: 1 mL via EPIDURAL

## 2012-09-13 DIAGNOSIS — L738 Other specified follicular disorders: Secondary | ICD-10-CM | POA: Diagnosis not present

## 2012-09-13 DIAGNOSIS — D235 Other benign neoplasm of skin of trunk: Secondary | ICD-10-CM | POA: Diagnosis not present

## 2012-09-13 DIAGNOSIS — R059 Cough, unspecified: Secondary | ICD-10-CM | POA: Diagnosis not present

## 2012-09-13 DIAGNOSIS — L819 Disorder of pigmentation, unspecified: Secondary | ICD-10-CM | POA: Diagnosis not present

## 2012-09-13 DIAGNOSIS — D492 Neoplasm of unspecified behavior of bone, soft tissue, and skin: Secondary | ICD-10-CM | POA: Diagnosis not present

## 2012-09-13 DIAGNOSIS — Z85828 Personal history of other malignant neoplasm of skin: Secondary | ICD-10-CM | POA: Diagnosis not present

## 2012-09-13 DIAGNOSIS — R0789 Other chest pain: Secondary | ICD-10-CM | POA: Diagnosis not present

## 2012-09-13 DIAGNOSIS — L678 Other hair color and hair shaft abnormalities: Secondary | ICD-10-CM | POA: Diagnosis not present

## 2012-09-13 DIAGNOSIS — R05 Cough: Secondary | ICD-10-CM | POA: Diagnosis not present

## 2012-09-13 DIAGNOSIS — J45909 Unspecified asthma, uncomplicated: Secondary | ICD-10-CM | POA: Diagnosis not present

## 2012-09-13 DIAGNOSIS — L578 Other skin changes due to chronic exposure to nonionizing radiation: Secondary | ICD-10-CM | POA: Diagnosis not present

## 2012-09-18 ENCOUNTER — Other Ambulatory Visit: Payer: Self-pay | Admitting: Neurosurgery

## 2012-09-18 DIAGNOSIS — M5412 Radiculopathy, cervical region: Secondary | ICD-10-CM

## 2012-09-19 ENCOUNTER — Telehealth: Payer: Self-pay | Admitting: Neurology

## 2012-09-20 NOTE — Telephone Encounter (Signed)
Called the patient and let him know per Dr. Frances Furbish he can Cancel his appointment and wait to follow up after he see's a neuro surgeon.

## 2012-09-21 ENCOUNTER — Ambulatory Visit
Admission: RE | Admit: 2012-09-21 | Discharge: 2012-09-21 | Disposition: A | Discharge status: Home / Self Care | Payer: Medicare Other | Source: Ambulatory Visit | Attending: Neurosurgery | Admitting: Neurosurgery

## 2012-09-21 DIAGNOSIS — M47812 Spondylosis without myelopathy or radiculopathy, cervical region: Secondary | ICD-10-CM | POA: Diagnosis not present

## 2012-09-21 DIAGNOSIS — M5412 Radiculopathy, cervical region: Secondary | ICD-10-CM

## 2012-09-21 MED ORDER — TRIAMCINOLONE ACETONIDE 40 MG/ML IJ SUSP (RADIOLOGY)
60.0000 mg | Freq: Once | INTRAMUSCULAR | Status: AC
  Administered 2012-09-21: 60 mg via EPIDURAL

## 2012-09-21 MED ORDER — IOHEXOL 300 MG/ML  SOLN
1.0000 mL | Freq: Once | INTRAMUSCULAR | Status: AC | PRN
  Administered 2012-09-21: 1 mL via EPIDURAL

## 2012-09-24 ENCOUNTER — Ambulatory Visit: Payer: Medicare Other | Admitting: Neurology

## 2012-10-18 DIAGNOSIS — I10 Essential (primary) hypertension: Secondary | ICD-10-CM | POA: Diagnosis not present

## 2012-10-18 DIAGNOSIS — I447 Left bundle-branch block, unspecified: Secondary | ICD-10-CM | POA: Diagnosis present

## 2012-10-18 DIAGNOSIS — R791 Abnormal coagulation profile: Secondary | ICD-10-CM | POA: Diagnosis not present

## 2012-10-18 DIAGNOSIS — R0602 Shortness of breath: Secondary | ICD-10-CM | POA: Diagnosis not present

## 2012-10-18 DIAGNOSIS — R509 Fever, unspecified: Secondary | ICD-10-CM | POA: Diagnosis not present

## 2012-10-18 DIAGNOSIS — N289 Disorder of kidney and ureter, unspecified: Secondary | ICD-10-CM | POA: Diagnosis present

## 2012-10-18 DIAGNOSIS — I509 Heart failure, unspecified: Secondary | ICD-10-CM | POA: Diagnosis not present

## 2012-10-18 DIAGNOSIS — J438 Other emphysema: Secondary | ICD-10-CM | POA: Diagnosis present

## 2012-10-18 DIAGNOSIS — I2510 Atherosclerotic heart disease of native coronary artery without angina pectoris: Secondary | ICD-10-CM | POA: Diagnosis present

## 2012-10-18 DIAGNOSIS — I951 Orthostatic hypotension: Secondary | ICD-10-CM | POA: Diagnosis not present

## 2012-10-18 DIAGNOSIS — R05 Cough: Secondary | ICD-10-CM | POA: Diagnosis not present

## 2012-10-18 DIAGNOSIS — R918 Other nonspecific abnormal finding of lung field: Secondary | ICD-10-CM | POA: Diagnosis not present

## 2012-10-18 DIAGNOSIS — K219 Gastro-esophageal reflux disease without esophagitis: Secondary | ICD-10-CM | POA: Diagnosis present

## 2012-10-18 DIAGNOSIS — I517 Cardiomegaly: Secondary | ICD-10-CM | POA: Diagnosis not present

## 2012-10-18 DIAGNOSIS — R0902 Hypoxemia: Secondary | ICD-10-CM | POA: Diagnosis not present

## 2012-10-18 DIAGNOSIS — N138 Other obstructive and reflux uropathy: Secondary | ICD-10-CM | POA: Insufficient documentation

## 2012-10-18 DIAGNOSIS — E119 Type 2 diabetes mellitus without complications: Secondary | ICD-10-CM | POA: Diagnosis present

## 2012-10-18 DIAGNOSIS — E785 Hyperlipidemia, unspecified: Secondary | ICD-10-CM | POA: Diagnosis present

## 2012-10-18 DIAGNOSIS — R799 Abnormal finding of blood chemistry, unspecified: Secondary | ICD-10-CM | POA: Diagnosis not present

## 2012-10-18 DIAGNOSIS — J449 Chronic obstructive pulmonary disease, unspecified: Secondary | ICD-10-CM | POA: Diagnosis not present

## 2012-10-18 DIAGNOSIS — F17200 Nicotine dependence, unspecified, uncomplicated: Secondary | ICD-10-CM | POA: Diagnosis present

## 2012-10-18 DIAGNOSIS — I079 Rheumatic tricuspid valve disease, unspecified: Secondary | ICD-10-CM | POA: Diagnosis present

## 2012-10-18 DIAGNOSIS — R079 Chest pain, unspecified: Secondary | ICD-10-CM | POA: Diagnosis not present

## 2012-10-18 DIAGNOSIS — R059 Cough, unspecified: Secondary | ICD-10-CM | POA: Diagnosis not present

## 2012-10-18 DIAGNOSIS — I259 Chronic ischemic heart disease, unspecified: Secondary | ICD-10-CM | POA: Diagnosis not present

## 2012-10-18 DIAGNOSIS — I270 Primary pulmonary hypertension: Secondary | ICD-10-CM | POA: Diagnosis not present

## 2012-10-18 DIAGNOSIS — D72825 Bandemia: Secondary | ICD-10-CM | POA: Diagnosis present

## 2012-10-18 DIAGNOSIS — I369 Nonrheumatic tricuspid valve disorder, unspecified: Secondary | ICD-10-CM | POA: Diagnosis not present

## 2012-10-18 DIAGNOSIS — R55 Syncope and collapse: Secondary | ICD-10-CM | POA: Diagnosis not present

## 2012-10-18 DIAGNOSIS — E860 Dehydration: Secondary | ICD-10-CM | POA: Diagnosis present

## 2012-10-18 DIAGNOSIS — N400 Enlarged prostate without lower urinary tract symptoms: Secondary | ICD-10-CM | POA: Diagnosis present

## 2012-10-18 DIAGNOSIS — I519 Heart disease, unspecified: Secondary | ICD-10-CM | POA: Diagnosis present

## 2012-10-18 DIAGNOSIS — R319 Hematuria, unspecified: Secondary | ICD-10-CM | POA: Diagnosis present

## 2012-10-18 DIAGNOSIS — J4489 Other specified chronic obstructive pulmonary disease: Secondary | ICD-10-CM | POA: Diagnosis not present

## 2012-10-18 DIAGNOSIS — J189 Pneumonia, unspecified organism: Secondary | ICD-10-CM | POA: Diagnosis not present

## 2012-10-18 DIAGNOSIS — Z7982 Long term (current) use of aspirin: Secondary | ICD-10-CM | POA: Diagnosis not present

## 2012-10-18 DIAGNOSIS — Z79899 Other long term (current) drug therapy: Secondary | ICD-10-CM | POA: Diagnosis not present

## 2012-10-23 DIAGNOSIS — Z885 Allergy status to narcotic agent status: Secondary | ICD-10-CM | POA: Diagnosis not present

## 2012-10-23 DIAGNOSIS — M7989 Other specified soft tissue disorders: Secondary | ICD-10-CM | POA: Diagnosis not present

## 2012-10-23 DIAGNOSIS — J189 Pneumonia, unspecified organism: Secondary | ICD-10-CM | POA: Diagnosis not present

## 2012-10-23 DIAGNOSIS — R079 Chest pain, unspecified: Secondary | ICD-10-CM | POA: Diagnosis not present

## 2012-10-23 DIAGNOSIS — E119 Type 2 diabetes mellitus without complications: Secondary | ICD-10-CM | POA: Diagnosis not present

## 2012-10-23 DIAGNOSIS — Z79899 Other long term (current) drug therapy: Secondary | ICD-10-CM | POA: Diagnosis not present

## 2012-10-23 DIAGNOSIS — J90 Pleural effusion, not elsewhere classified: Secondary | ICD-10-CM | POA: Diagnosis not present

## 2012-10-23 DIAGNOSIS — Z7982 Long term (current) use of aspirin: Secondary | ICD-10-CM | POA: Diagnosis not present

## 2012-10-23 DIAGNOSIS — I447 Left bundle-branch block, unspecified: Secondary | ICD-10-CM | POA: Diagnosis present

## 2012-10-23 DIAGNOSIS — K219 Gastro-esophageal reflux disease without esophagitis: Secondary | ICD-10-CM | POA: Diagnosis present

## 2012-10-23 DIAGNOSIS — K5900 Constipation, unspecified: Secondary | ICD-10-CM | POA: Diagnosis not present

## 2012-10-23 DIAGNOSIS — I10 Essential (primary) hypertension: Secondary | ICD-10-CM | POA: Diagnosis not present

## 2012-10-23 DIAGNOSIS — R918 Other nonspecific abnormal finding of lung field: Secondary | ICD-10-CM | POA: Diagnosis not present

## 2012-10-23 DIAGNOSIS — Z9889 Other specified postprocedural states: Secondary | ICD-10-CM | POA: Diagnosis not present

## 2012-10-23 DIAGNOSIS — R0602 Shortness of breath: Secondary | ICD-10-CM | POA: Diagnosis not present

## 2012-10-23 DIAGNOSIS — F17200 Nicotine dependence, unspecified, uncomplicated: Secondary | ICD-10-CM | POA: Diagnosis present

## 2012-10-23 DIAGNOSIS — N400 Enlarged prostate without lower urinary tract symptoms: Secondary | ICD-10-CM | POA: Diagnosis present

## 2012-10-23 DIAGNOSIS — I2699 Other pulmonary embolism without acute cor pulmonale: Secondary | ICD-10-CM | POA: Diagnosis not present

## 2012-10-23 DIAGNOSIS — E785 Hyperlipidemia, unspecified: Secondary | ICD-10-CM | POA: Diagnosis present

## 2012-10-23 DIAGNOSIS — J449 Chronic obstructive pulmonary disease, unspecified: Secondary | ICD-10-CM | POA: Diagnosis not present

## 2012-10-23 DIAGNOSIS — I259 Chronic ischemic heart disease, unspecified: Secondary | ICD-10-CM | POA: Diagnosis not present

## 2012-10-23 DIAGNOSIS — N179 Acute kidney failure, unspecified: Secondary | ICD-10-CM | POA: Diagnosis not present

## 2012-10-23 DIAGNOSIS — R1110 Vomiting, unspecified: Secondary | ICD-10-CM | POA: Diagnosis not present

## 2012-10-23 DIAGNOSIS — J4489 Other specified chronic obstructive pulmonary disease: Secondary | ICD-10-CM | POA: Diagnosis not present

## 2012-10-23 DIAGNOSIS — I2510 Atherosclerotic heart disease of native coronary artery without angina pectoris: Secondary | ICD-10-CM | POA: Diagnosis present

## 2012-10-23 DIAGNOSIS — J159 Unspecified bacterial pneumonia: Secondary | ICD-10-CM | POA: Diagnosis present

## 2012-10-24 DIAGNOSIS — J449 Chronic obstructive pulmonary disease, unspecified: Secondary | ICD-10-CM | POA: Insufficient documentation

## 2012-11-01 DIAGNOSIS — N400 Enlarged prostate without lower urinary tract symptoms: Secondary | ICD-10-CM | POA: Diagnosis not present

## 2012-11-01 DIAGNOSIS — R7303 Prediabetes: Secondary | ICD-10-CM | POA: Insufficient documentation

## 2012-11-01 DIAGNOSIS — Z0000 Encounter for general adult medical examination without abnormal findings: Secondary | ICD-10-CM | POA: Diagnosis not present

## 2012-11-01 DIAGNOSIS — I2699 Other pulmonary embolism without acute cor pulmonale: Secondary | ICD-10-CM | POA: Insufficient documentation

## 2012-11-01 DIAGNOSIS — E785 Hyperlipidemia, unspecified: Secondary | ICD-10-CM | POA: Diagnosis not present

## 2012-11-09 DIAGNOSIS — R062 Wheezing: Secondary | ICD-10-CM | POA: Diagnosis not present

## 2012-11-09 DIAGNOSIS — Z86711 Personal history of pulmonary embolism: Secondary | ICD-10-CM | POA: Diagnosis not present

## 2012-11-09 DIAGNOSIS — R0602 Shortness of breath: Secondary | ICD-10-CM | POA: Diagnosis not present

## 2012-11-23 DIAGNOSIS — J9819 Other pulmonary collapse: Secondary | ICD-10-CM | POA: Diagnosis not present

## 2012-11-23 DIAGNOSIS — J449 Chronic obstructive pulmonary disease, unspecified: Secondary | ICD-10-CM | POA: Diagnosis not present

## 2012-11-23 DIAGNOSIS — J984 Other disorders of lung: Secondary | ICD-10-CM | POA: Diagnosis not present

## 2012-11-23 DIAGNOSIS — R918 Other nonspecific abnormal finding of lung field: Secondary | ICD-10-CM | POA: Diagnosis not present

## 2012-11-28 DIAGNOSIS — N400 Enlarged prostate without lower urinary tract symptoms: Secondary | ICD-10-CM | POA: Diagnosis not present

## 2012-11-30 DIAGNOSIS — J449 Chronic obstructive pulmonary disease, unspecified: Secondary | ICD-10-CM | POA: Diagnosis not present

## 2012-11-30 DIAGNOSIS — Z86711 Personal history of pulmonary embolism: Secondary | ICD-10-CM | POA: Diagnosis not present

## 2012-12-07 DIAGNOSIS — N400 Enlarged prostate without lower urinary tract symptoms: Secondary | ICD-10-CM | POA: Diagnosis not present

## 2012-12-07 DIAGNOSIS — I509 Heart failure, unspecified: Secondary | ICD-10-CM | POA: Diagnosis not present

## 2012-12-07 DIAGNOSIS — M542 Cervicalgia: Secondary | ICD-10-CM | POA: Diagnosis not present

## 2012-12-07 DIAGNOSIS — I2699 Other pulmonary embolism without acute cor pulmonale: Secondary | ICD-10-CM | POA: Diagnosis not present

## 2012-12-10 DIAGNOSIS — Z86711 Personal history of pulmonary embolism: Secondary | ICD-10-CM | POA: Diagnosis not present

## 2012-12-10 DIAGNOSIS — J449 Chronic obstructive pulmonary disease, unspecified: Secondary | ICD-10-CM | POA: Diagnosis not present

## 2012-12-10 DIAGNOSIS — R21 Rash and other nonspecific skin eruption: Secondary | ICD-10-CM | POA: Diagnosis not present

## 2013-01-24 DIAGNOSIS — I452 Bifascicular block: Secondary | ICD-10-CM | POA: Diagnosis not present

## 2013-01-24 DIAGNOSIS — I10 Essential (primary) hypertension: Secondary | ICD-10-CM | POA: Diagnosis not present

## 2013-01-24 DIAGNOSIS — I498 Other specified cardiac arrhythmias: Secondary | ICD-10-CM | POA: Diagnosis not present

## 2013-01-24 DIAGNOSIS — I259 Chronic ischemic heart disease, unspecified: Secondary | ICD-10-CM | POA: Diagnosis not present

## 2013-01-24 DIAGNOSIS — Z01810 Encounter for preprocedural cardiovascular examination: Secondary | ICD-10-CM | POA: Diagnosis not present

## 2013-01-31 DIAGNOSIS — I2699 Other pulmonary embolism without acute cor pulmonale: Secondary | ICD-10-CM | POA: Diagnosis not present

## 2013-01-31 DIAGNOSIS — N400 Enlarged prostate without lower urinary tract symptoms: Secondary | ICD-10-CM | POA: Diagnosis not present

## 2013-01-31 DIAGNOSIS — I10 Essential (primary) hypertension: Secondary | ICD-10-CM | POA: Diagnosis not present

## 2013-02-01 DIAGNOSIS — I10 Essential (primary) hypertension: Secondary | ICD-10-CM | POA: Diagnosis not present

## 2013-02-01 DIAGNOSIS — N400 Enlarged prostate without lower urinary tract symptoms: Secondary | ICD-10-CM | POA: Diagnosis not present

## 2013-02-01 DIAGNOSIS — I2699 Other pulmonary embolism without acute cor pulmonale: Secondary | ICD-10-CM | POA: Diagnosis not present

## 2013-02-19 ENCOUNTER — Encounter: Payer: Self-pay | Admitting: Sports Medicine

## 2013-02-19 ENCOUNTER — Ambulatory Visit (INDEPENDENT_AMBULATORY_CARE_PROVIDER_SITE_OTHER): Payer: Medicare Other | Admitting: Sports Medicine

## 2013-02-19 ENCOUNTER — Other Ambulatory Visit: Payer: Self-pay | Admitting: Sports Medicine

## 2013-02-19 VITALS — BP 142/78 | HR 69 | Wt 189.0 lb

## 2013-02-19 DIAGNOSIS — D539 Nutritional anemia, unspecified: Secondary | ICD-10-CM | POA: Diagnosis not present

## 2013-02-19 DIAGNOSIS — I10 Essential (primary) hypertension: Secondary | ICD-10-CM | POA: Insufficient documentation

## 2013-02-19 DIAGNOSIS — D509 Iron deficiency anemia, unspecified: Secondary | ICD-10-CM | POA: Insufficient documentation

## 2013-02-19 DIAGNOSIS — I4510 Unspecified right bundle-branch block: Secondary | ICD-10-CM | POA: Diagnosis not present

## 2013-02-19 DIAGNOSIS — Z862 Personal history of diseases of the blood and blood-forming organs and certain disorders involving the immune mechanism: Secondary | ICD-10-CM

## 2013-02-19 DIAGNOSIS — M5030 Other cervical disc degeneration, unspecified cervical region: Secondary | ICD-10-CM | POA: Diagnosis not present

## 2013-02-19 DIAGNOSIS — Z01810 Encounter for preprocedural cardiovascular examination: Secondary | ICD-10-CM | POA: Insufficient documentation

## 2013-02-19 DIAGNOSIS — Z86711 Personal history of pulmonary embolism: Secondary | ICD-10-CM | POA: Insufficient documentation

## 2013-02-19 DIAGNOSIS — Z299 Encounter for prophylactic measures, unspecified: Secondary | ICD-10-CM

## 2013-02-19 DIAGNOSIS — J449 Chronic obstructive pulmonary disease, unspecified: Secondary | ICD-10-CM

## 2013-02-19 DIAGNOSIS — E785 Hyperlipidemia, unspecified: Secondary | ICD-10-CM

## 2013-02-19 DIAGNOSIS — J4489 Other specified chronic obstructive pulmonary disease: Secondary | ICD-10-CM

## 2013-02-19 MED ORDER — PANTOPRAZOLE SODIUM 40 MG PO TBEC: 40.0000 mg | DELAYED_RELEASE_TABLET | Freq: Every morning | ORAL | Status: DC

## 2013-02-19 MED ORDER — RIVAROXABAN 15 MG PO TABS: 15.0000 mg | ORAL_TABLET | Freq: Two times a day (BID) | ORAL | Status: DC

## 2013-02-19 MED ORDER — MONTELUKAST SODIUM 10 MG PO TABS: 10.0000 mg | ORAL_TABLET | Freq: Every day | ORAL | Status: DC

## 2013-02-19 MED ORDER — VALSARTAN 320 MG PO TABS: 320.0000 mg | ORAL_TABLET | Freq: Every morning | ORAL | Status: DC

## 2013-02-19 MED ORDER — SIMVASTATIN 20 MG PO TABS: 20.0000 mg | ORAL_TABLET | Freq: Every evening | ORAL | Status: DC

## 2013-02-19 NOTE — Assessment & Plan Note (Addendum)
Rechecking CBC with an anemia panel. Anemia has worsened, it is iron deficiency, we are going to do iron supplementation but first I would like him to try Hemoccults, he may need an endoscopy/colonoscopy.

## 2013-02-19 NOTE — Assessment & Plan Note (Signed)
Restarting hydrochlorothiazide. Continue valsartan. Discontinue Lasix. He did have some bradycardia and presyncopal symptoms, decreasing Coreg to 6.25.

## 2013-02-19 NOTE — Assessment & Plan Note (Signed)
Continue Zocor 

## 2013-02-19 NOTE — Assessment & Plan Note (Signed)
Continue xarelto for a total of 6 months.

## 2013-02-19 NOTE — Assessment & Plan Note (Signed)
Has multilevel degenerative changes of C5-C6 fusion. He did have epidurals in the past which were effective, being on xarelto we cannot do any more, when he comes off of xarelto in 6 months we can discuss repeating an epidural.

## 2013-02-19 NOTE — Progress Notes (Signed)
  Subjective:    CC: Establish care.   HPI:  Right bundle branch block: Has a cardiologist, this is stable and asymptomatic.  Cervical degenerative disc disease: Status post C5-C6 ACDF, he had been getting epidurals which were quite effective, had to stop because he is now on xarelto.  Pulmonary embolism: Occurred a few months ago, he is now currently on xarelto and asymptomatic.  History of anemia: Unclear etiology, desires to have this rechecked.  Hypertension: Uncontrolled, previous doctor took him off of hydrochlorothiazide and switched to furosemide. No headaches, visual changes, chest pain.  Asthma/COPD: Currently using an inhaler, he does note increased exercise tolerance and minimally decreased shortness of breath at rest when using his Advair.  Past medical history, Surgical history, Family history not pertinant except as noted below, Social history, Allergies, and medications have been entered into the medical record, reviewed, and no changes needed.   Review of Systems: No headache, visual changes, nausea, vomiting, diarrhea, constipation, dizziness, abdominal pain, skin rash, fevers, chills, night sweats, swollen lymph nodes, weight loss, chest pain, body aches, joint swelling, muscle aches, shortness of breath, mood changes, visual or auditory hallucinations.  Objective:    General: Well Developed, well nourished, and in no acute distress.  Neuro: Alert and oriented x3, extra-ocular muscles intact, sensation grossly intact.  HEENT: Normocephalic, atraumatic, pupils equal round reactive to light, neck supple, no masses, no lymphadenopathy, thyroid nonpalpable.  Skin: Warm and dry, no rashes noted.  Cardiac: Regular rate and rhythm, no murmurs rubs or gallops.  Respiratory: Clear to auscultation bilaterally. Not using accessory muscles, speaking in full sentences.  Abdominal: Soft, nontender, nondistended, positive bowel sounds, no masses, no organomegaly.  Musculoskeletal:  Shoulder, elbow, wrist, hip, knee, ankle stable, and with full range of motion.  Impression and Recommendations:    The patient was counselled, risk factors were discussed, anticipatory guidance given.

## 2013-02-19 NOTE — Assessment & Plan Note (Signed)
Continue Advair at current dose.

## 2013-02-20 ENCOUNTER — Encounter: Payer: Self-pay | Admitting: *Deleted

## 2013-02-20 LAB — CBC
HCT: 32.8 % — ABNORMAL LOW (ref 39.0–52.0)
Hemoglobin: 10.8 g/dL — ABNORMAL LOW (ref 13.0–17.0)
MCH: 27.3 pg (ref 26.0–34.0)
MCHC: 32.9 g/dL (ref 30.0–36.0)
MCV: 82.8 fL (ref 78.0–100.0)
Platelets: 306 10*3/uL (ref 150–400)
RBC: 3.96 MIL/uL — ABNORMAL LOW (ref 4.22–5.81)
RDW: 15.6 % — ABNORMAL HIGH (ref 11.5–15.5)
WBC: 6.8 K/uL (ref 4.0–10.5)

## 2013-02-20 LAB — IRON AND TIBC
%SAT: 14 % — ABNORMAL LOW (ref 20–55)
Iron: 45 ug/dL (ref 42–165)
TIBC: 320 ug/dL (ref 215–435)
UIBC: 275 ug/dL (ref 125–400)

## 2013-02-20 LAB — FERRITIN: Ferritin: 53 ng/mL (ref 22–322)

## 2013-02-20 LAB — FOLATE: Folate: >20 ng/mL

## 2013-02-20 LAB — VITAMIN B12: Vitamin B-12: 479 pg/mL (ref 211–911)

## 2013-02-21 ENCOUNTER — Ambulatory Visit (INDEPENDENT_AMBULATORY_CARE_PROVIDER_SITE_OTHER): Payer: Medicare Other | Admitting: Sports Medicine

## 2013-02-21 ENCOUNTER — Encounter: Payer: Self-pay | Admitting: Sports Medicine

## 2013-02-21 VITALS — BP 129/72 | HR 65 | Wt 190.0 lb

## 2013-02-21 DIAGNOSIS — Z86711 Personal history of pulmonary embolism: Secondary | ICD-10-CM

## 2013-02-21 DIAGNOSIS — D509 Iron deficiency anemia, unspecified: Secondary | ICD-10-CM | POA: Diagnosis not present

## 2013-02-21 DIAGNOSIS — R319 Hematuria, unspecified: Secondary | ICD-10-CM | POA: Diagnosis not present

## 2013-02-21 DIAGNOSIS — R109 Unspecified abdominal pain: Secondary | ICD-10-CM | POA: Insufficient documentation

## 2013-02-21 DIAGNOSIS — I10 Essential (primary) hypertension: Secondary | ICD-10-CM | POA: Diagnosis not present

## 2013-02-21 DIAGNOSIS — R10A1 Flank pain, right side: Secondary | ICD-10-CM | POA: Insufficient documentation

## 2013-02-21 MED ORDER — FERROUS SULFATE 325 (65 FE) MG PO TBEC: 325.0000 mg | DELAYED_RELEASE_TABLET | Freq: Three times a day (TID) | ORAL | Status: DC

## 2013-02-21 NOTE — Assessment & Plan Note (Signed)
Adding iron supplementation. This is likely due to a GI source from his xarelto. I am going to send him to gastroenterology, I do think he needs to be scoped. We will keep a close followup on his iron levels, and keep him on xarelto for now.

## 2013-02-21 NOTE — Progress Notes (Signed)
  Subjective:    CC: Followup labs  HPI: Iron deficiency anemia: This pleasant 73 year old male who was on xarelto for pulmonary embolism returns for followup of worsening anemia. He is a somatic however hemoglobin has dropped from 11-10. He does endorse some dark and tarry stools.  Pulmonary embolism: needs to be on xarelto.  Hypertension: Now well controlled with restarting hydrochlorothiazide.  History of hematuria: This was noted on her previous hospitalization but has not yet been followed up on. Hematuria is intermittent.  Past medical history, Surgical history, Family history not pertinant except as noted below, Social history, Allergies, and medications have been entered into the medical record, reviewed, and no changes needed.   Review of Systems: No fevers, chills, night sweats, weight loss, chest pain, or shortness of breath.   Objective:    General: Well Developed, well nourished, and in no acute distress.  Neuro: Alert and oriented x3, extra-ocular muscles intact, sensation grossly intact.  HEENT: Normocephalic, atraumatic, pupils equal round reactive to light, neck supple, no masses, no lymphadenopathy, thyroid nonpalpable.  Skin: Warm and dry, no rashes. Cardiac: Regular rate and rhythm, no murmurs rubs or gallops, no lower extremity edema.  Respiratory: Clear to auscultation bilaterally. Not using accessory muscles, speaking in full sentences. Rectal: Good tone, prostate is smooth, Hemoccult negative.  Impression and Recommendations:

## 2013-02-21 NOTE — Assessment & Plan Note (Signed)
Well controlled 

## 2013-02-21 NOTE — Assessment & Plan Note (Signed)
He is developing some iron deficiency anemia which is likely related to a GI source from his xarelto we do need to continue anticoagulant treatment. We will keep a close eye on his hemoglobin. I am still sitting to gastroenterology for consideration of upper and lower endoscopy to look for a malignant source.

## 2013-02-21 NOTE — Assessment & Plan Note (Signed)
This was historical, during his hospitalization, and never followed up on. I'm going to send him downstairs for urinalysis, as well as renal ultrasound.

## 2013-02-22 LAB — URINALYSIS
Bilirubin Urine: NEGATIVE
Glucose, UA: NEGATIVE mg/dL
Ketones, ur: NEGATIVE mg/dL
Nitrite: NEGATIVE
Protein, ur: 30 mg/dL — AB
Specific Gravity, Urine: 1.008 (ref 1.005–1.030)
Urobilinogen, UA: 0.2 mg/dL (ref 0.0–1.0)
pH: 6 (ref 5.0–8.0)

## 2013-02-22 MED ORDER — CIPROFLOXACIN HCL 750 MG PO TABS: 750.0000 mg | ORAL_TABLET | Freq: Two times a day (BID) | ORAL | Status: DC

## 2013-02-22 NOTE — Addendum Note (Signed)
Addended by: Silverio Decamp on: 02/22/2013 08:44 AM   Modules accepted: Orders

## 2013-02-27 ENCOUNTER — Encounter: Payer: Self-pay | Admitting: Sports Medicine

## 2013-02-27 ENCOUNTER — Other Ambulatory Visit: Payer: Self-pay | Admitting: Sports Medicine

## 2013-02-27 NOTE — Telephone Encounter (Signed)
Rx changed to 20mg  daily maintenance for 6 months total then stop.

## 2013-03-04 ENCOUNTER — Ambulatory Visit (INDEPENDENT_AMBULATORY_CARE_PROVIDER_SITE_OTHER): Payer: Medicare Other

## 2013-03-04 DIAGNOSIS — R319 Hematuria, unspecified: Secondary | ICD-10-CM | POA: Diagnosis not present

## 2013-03-04 DIAGNOSIS — D509 Iron deficiency anemia, unspecified: Secondary | ICD-10-CM | POA: Diagnosis not present

## 2013-03-04 DIAGNOSIS — K219 Gastro-esophageal reflux disease without esophagitis: Secondary | ICD-10-CM | POA: Diagnosis not present

## 2013-03-04 DIAGNOSIS — N1330 Unspecified hydronephrosis: Secondary | ICD-10-CM

## 2013-03-04 DIAGNOSIS — K921 Melena: Secondary | ICD-10-CM | POA: Diagnosis not present

## 2013-03-05 ENCOUNTER — Encounter: Payer: Self-pay | Admitting: Sports Medicine

## 2013-03-05 DIAGNOSIS — L578 Other skin changes due to chronic exposure to nonionizing radiation: Secondary | ICD-10-CM | POA: Diagnosis not present

## 2013-03-05 DIAGNOSIS — Z85828 Personal history of other malignant neoplasm of skin: Secondary | ICD-10-CM | POA: Diagnosis not present

## 2013-03-05 DIAGNOSIS — L259 Unspecified contact dermatitis, unspecified cause: Secondary | ICD-10-CM | POA: Diagnosis not present

## 2013-03-05 DIAGNOSIS — L819 Disorder of pigmentation, unspecified: Secondary | ICD-10-CM | POA: Diagnosis not present

## 2013-03-05 DIAGNOSIS — L821 Other seborrheic keratosis: Secondary | ICD-10-CM | POA: Diagnosis not present

## 2013-03-07 DIAGNOSIS — J449 Chronic obstructive pulmonary disease, unspecified: Secondary | ICD-10-CM | POA: Diagnosis not present

## 2013-03-07 DIAGNOSIS — R5383 Other fatigue: Secondary | ICD-10-CM | POA: Diagnosis not present

## 2013-03-07 DIAGNOSIS — R0602 Shortness of breath: Secondary | ICD-10-CM | POA: Diagnosis not present

## 2013-03-07 DIAGNOSIS — R5381 Other malaise: Secondary | ICD-10-CM | POA: Diagnosis not present

## 2013-03-19 ENCOUNTER — Encounter: Payer: Self-pay | Admitting: Sports Medicine

## 2013-03-19 ENCOUNTER — Ambulatory Visit (INDEPENDENT_AMBULATORY_CARE_PROVIDER_SITE_OTHER): Payer: Medicare Other | Admitting: Sports Medicine

## 2013-03-19 VITALS — BP 108/62 | HR 64 | Ht 72.0 in | Wt 188.0 lb

## 2013-03-19 DIAGNOSIS — R319 Hematuria, unspecified: Secondary | ICD-10-CM | POA: Diagnosis not present

## 2013-03-19 DIAGNOSIS — D509 Iron deficiency anemia, unspecified: Secondary | ICD-10-CM

## 2013-03-19 DIAGNOSIS — I10 Essential (primary) hypertension: Secondary | ICD-10-CM | POA: Diagnosis not present

## 2013-03-19 DIAGNOSIS — Z86711 Personal history of pulmonary embolism: Secondary | ICD-10-CM

## 2013-03-19 DIAGNOSIS — K649 Unspecified hemorrhoids: Secondary | ICD-10-CM | POA: Diagnosis not present

## 2013-03-19 MED ORDER — HYDROCORTISONE ACETATE 25 MG RE SUPP: 25.0000 mg | Freq: Two times a day (BID) | RECTAL | Status: DC | PRN

## 2013-03-19 NOTE — Assessment & Plan Note (Signed)
Well controlled. No changes. 

## 2013-03-19 NOTE — Progress Notes (Signed)
  Subjective:    CC: Follow up  HPI: Hypertension: Well controlled.  History of pulmonary embolism: did have some GI bleeding anemia, his pulmonologist recently stopped his xarelto.  Anemia: Feeling much better since starting iron supplementation. He does have upper endoscopy and colonoscopy coming up.  Hemorrhoids: Overall improving but would like some treatment. He is not having any bleeding, just pain with stooling.  Past medical history, Surgical history, Family history not pertinant except as noted below, Social history, Allergies, and medications have been entered into the medical record, reviewed, and no changes needed.   Review of Systems: No fevers, chills, night sweats, weight loss, chest pain, or shortness of breath.   Objective:    General: Well Developed, well nourished, and in no acute distress.  Neuro: Alert and oriented x3, extra-ocular muscles intact, sensation grossly intact.  HEENT: Normocephalic, atraumatic, pupils equal round reactive to light, neck supple, no masses, no lymphadenopathy, thyroid nonpalpable.  Skin: Warm and dry, no rashes. Cardiac: Regular rate and rhythm, no murmurs rubs or gallops, no lower extremity edema.  Respiratory: Clear to auscultation bilaterally. Not using accessory muscles, speaking in full sentences.  Impression and Recommendations:

## 2013-03-19 NOTE — Assessment & Plan Note (Signed)
Normal renal ultrasound

## 2013-03-19 NOTE — Assessment & Plan Note (Signed)
Feels much better since stopping xarelto. Energy levels are improving. He does have an upper and lower endoscopy coming out.

## 2013-03-19 NOTE — Assessment & Plan Note (Signed)
Has had 5 months of xarelto. This is discontinued.

## 2013-03-19 NOTE — Assessment & Plan Note (Signed)
Symptoms are improving significantly already. He is already using stool softener and soothing wipes. I'm going to add Anusol-HC suppositories and he is going to see the gastroenterologist soon.

## 2013-03-24 ENCOUNTER — Encounter: Payer: Self-pay | Admitting: Emergency Medicine

## 2013-03-24 ENCOUNTER — Emergency Department (INDEPENDENT_AMBULATORY_CARE_PROVIDER_SITE_OTHER)
Admission: EM | Admit: 2013-03-24 | Discharge: 2013-03-24 | Disposition: A | Discharge status: Home / Self Care | Payer: Medicare Other | Source: Home / Self Care | Attending: Family Medicine | Admitting: Family Medicine

## 2013-03-24 DIAGNOSIS — J069 Acute upper respiratory infection, unspecified: Secondary | ICD-10-CM

## 2013-03-24 LAB — POCT RAPID STREP A (OFFICE): Rapid Strep A Screen: NEGATIVE

## 2013-03-24 MED ORDER — OSELTAMIVIR PHOSPHATE 75 MG PO CAPS: 75.0000 mg | ORAL_CAPSULE | Freq: Two times a day (BID) | ORAL | Status: DC

## 2013-03-24 NOTE — Discharge Instructions (Signed)
Take plain Mucinex (1200 mg guaifenesin) twice daily for cough and congestion.  Increase fluid intake, rest. May use Afrin nasal spray (or generic oxymetazoline) twice daily for about 5 days.  Also recommend using saline nasal spray several times daily and saline nasal irrigation (AYR is a common brand) Try warm salt water gargles for sore throat.  Stop all antihistamines for now, and other non-prescription cough/cold preparations. May take Delsym Cough Suppressant at bedtime for nighttime cough.    Follow-up with family doctor if not improving about 5 days.

## 2013-03-24 NOTE — ED Provider Notes (Signed)
CSN: [id number]     Arrival date & time 03/24/13  1155 History   First MD Initiated Contact with Patient 03/24/13 1236     Chief Complaint  Patient presents with  . Fatigue      HPI Comments: Patient awoke this morning with a mild sore throat, sinus congestion, and feeling fatigued.  He is also developing a mild cough.  His wife is developing a respiratory illness and he is concerned that he may be developing flu.  The history is provided by the patient and the spouse.    Past Medical History  Diagnosis Date  . Myocardial infarction 2010  . Complication of anesthesia     severe case of hiccups after hernia surgery;required medication  . Hyperlipidemia     takes Zocor daily  . Hypertension     takes HCTZ,Diovan daily and Isosorbide daily  . Asthma     uses Advair bid and asmanex prn  . Emphysema   . Pneumonia     hx of---12+yrs ago  . Chronic bronchitis     couple of yrs ago  . Dizziness     related to neck issues  . Arthritis     back  . Joint pain   . Chronic back pain     buldging disc and scoliosis  . Neck pain   . Itching     down left arm and leg  . GERD (gastroesophageal reflux disease)     takes PRotonix daily  . History of colon polyps   . Diverticulosis   . Enlarged prostate     takes Flomax daily  . Macular degeneration     mild,beginning  . Pain in limb 06/20/2012   Past Surgical History  Procedure Laterality Date  . Hernia repair  2006    x 3   . Salivary gland removed      left   . Vein removed from left leg    . Colonoscopy    . Skin cancer removed from left side of face      basal cell carcinoma  . Cardiac catheterization  2010/2013    06/07/11 [facility] LAD 25%, D2 75% (small), pLCx 100%, RCA 25%--medical managment   . Anterior cervical decomp/discectomy fusion  01/31/2012    Procedure: ANTERIOR CERVICAL DECOMPRESSION/DISCECTOMY FUSION 1 LEVEL;  Surgeon: Floyce Stakes, MD;  Location: [facility];  Service: Neurosurgery;  Laterality: N/A;   Cervical Five-Six  Anterior Cervical Decompression /Diskectomy /Fusion/Plate   Family History  Problem Relation Age of Onset  . Cancer Sister   . Cancer Sister   . Hypertension Mother   . Heart attack Father    History  Substance Use Topics  . Smoking status: Current Some Day Smoker    Types: Pipe  . Smokeless tobacco: Not on file  . Alcohol Use: Yes     Comment: 1-2 glasses of wine daily    Review of Systems + sore throat + mild cough No pleuritic pain No wheezing + nasal congestion ? post-nasal drainage No sinus pain/pressure No itchy/red eyes No earache No hemoptysis No SOB No fever/chills No nausea No vomiting No abdominal pain No diarrhea No urinary symptoms No skin rash + fatigue No myalgias No headache Used OTC meds without relief  Allergies  Codeine and Mushroom extract complex  Home Medications   Current Outpatient Rx  Name  Route  Sig  Dispense  Refill  . aspirin 81 MG tablet   Oral   Take 81 mg  by mouth daily.         . carvedilol (COREG) 12.5 MG tablet   Oral   Take 0.5 tablets (6.25 mg total) by mouth 2 (two) times daily with a meal.         . docusate sodium (COLACE) 100 MG capsule   Oral   Take 100 mg by mouth 2 (two) times daily.         . ferrous sulfate 325 (65 FE) MG EC tablet   Oral   Take 1 tablet (325 mg total) by mouth 3 (three) times daily with meals.   90 tablet   11   . Fluticasone-Salmeterol (ADVAIR) 250-50 MCG/DOSE AEPB   Inhalation   Inhale 1 puff into the lungs every 12 (twelve) hours.         . hydrochlorothiazide (MICROZIDE) 12.5 MG capsule   Oral   Take 1 capsule (12.5 mg total) by mouth daily.         . hydrocortisone (ANUSOL-HC) 25 MG suppository   Rectal   Place 1 suppository (25 mg total) rectally 2 (two) times daily as needed for hemorrhoids or itching.   24 suppository   2   . isosorbide mononitrate (IMDUR) 30 MG 24 hr tablet   Oral   Take 30 mg by mouth 2 (two) times daily.          . montelukast (SINGULAIR) 10 MG tablet   Oral   Take 1 tablet (10 mg total) by mouth at bedtime.   90 tablet   3   . Multiple Vitamin (MULTIVITAMIN) tablet   Oral   Take 1 tablet by mouth daily.         . nitroGLYCERIN (NITROSTAT) 0.4 MG SL tablet   Sublingual   Place 0.4 mg under the tongue every 5 (five) minutes as needed. For chest pain         . pantoprazole (PROTONIX) 40 MG tablet   Oral   Take 1 tablet (40 mg total) by mouth every morning.   90 tablet   3   . simvastatin (ZOCOR) 20 MG tablet   Oral   Take 1 tablet (20 mg total) by mouth every evening.   90 tablet   3   . valsartan (DIOVAN) 320 MG tablet   Oral   Take 1 tablet (320 mg total) by mouth every morning.   90 tablet   3    BP 121/65  Pulse 58  Temp(Src) 97.6 F (36.4 C) (Oral)  Ht 6' (1.829 m)  Wt 188 lb (85.276 kg)  BMI 25.49 kg/m2  SpO2 96% Physical Exam Nursing notes and Vital Signs reviewed. Appearance:  Patient appears stated age, and in no acute distress Eyes:  Pupils are equal, round, and reactive to light and accomodation.  Extraocular movement is intact.  Conjunctivae are not inflamed  Ears:  Canals normal.  Tympanic membranes normal.  Nose:  Mildly congested turbinates.  No sinus tenderness.  Pharynx:  Minimal erythema Neck:  Supple.  Tender left posterior nodes are palpated  Lungs:  Clear to auscultation.  Breath sounds are equal.  Heart:  Regular rate and rhythm without murmurs, rubs, or gallops.  Abdomen:  Nontender without masses or hepatosplenomegaly.  Bowel sounds are present.  No CVA or flank tenderness.  Extremities:  No edema.  No calf tenderness Skin:  No rash present.   ED Course  Procedures  none    Labs Reviewed  POCT RAPID STREP A (OFFICE) negative  MDM   1. Acute upper respiratory infections of unspecified site; suspect early onset viral URI    Patient is immunocompromized with possible exposure to influenza; will begin Tamiflu. Take plain  Mucinex (1200 mg guaifenesin) twice daily for cough and congestion.  Increase fluid intake, rest. May use Afrin nasal spray (or generic oxymetazoline) twice daily for about 5 days.  Also recommend using saline nasal spray several times daily and saline nasal irrigation (AYR is a common brand) Try warm salt water gargles for sore throat.  Stop all antihistamines for now, and other non-prescription cough/cold preparations. May take Delsym Cough Suppressant at bedtime for nighttime cough.    Follow-up with family doctor if not improving about 5 days.    Kandra Nicolas, MD 03/27/13 [phone]

## 2013-03-24 NOTE — ED Notes (Signed)
Pt started to feel a bit weak and tired with a  Slight sore throat.  Pt states his wife is sick and this is more of a precautionary visit.

## 2013-04-01 DIAGNOSIS — K648 Other hemorrhoids: Secondary | ICD-10-CM | POA: Diagnosis not present

## 2013-04-01 DIAGNOSIS — D129 Benign neoplasm of anus and anal canal: Secondary | ICD-10-CM | POA: Diagnosis not present

## 2013-04-01 DIAGNOSIS — Z883 Allergy status to other anti-infective agents status: Secondary | ICD-10-CM | POA: Diagnosis not present

## 2013-04-01 DIAGNOSIS — D509 Iron deficiency anemia, unspecified: Secondary | ICD-10-CM | POA: Diagnosis not present

## 2013-04-01 DIAGNOSIS — K621 Rectal polyp: Secondary | ICD-10-CM | POA: Diagnosis not present

## 2013-04-01 DIAGNOSIS — I2510 Atherosclerotic heart disease of native coronary artery without angina pectoris: Secondary | ICD-10-CM | POA: Diagnosis not present

## 2013-04-01 DIAGNOSIS — Z79899 Other long term (current) drug therapy: Secondary | ICD-10-CM | POA: Diagnosis not present

## 2013-04-01 DIAGNOSIS — I10 Essential (primary) hypertension: Secondary | ICD-10-CM | POA: Diagnosis not present

## 2013-04-01 DIAGNOSIS — M129 Arthropathy, unspecified: Secondary | ICD-10-CM | POA: Diagnosis not present

## 2013-04-01 DIAGNOSIS — Z87891 Personal history of nicotine dependence: Secondary | ICD-10-CM | POA: Diagnosis not present

## 2013-04-01 DIAGNOSIS — E119 Type 2 diabetes mellitus without complications: Secondary | ICD-10-CM | POA: Diagnosis not present

## 2013-04-01 DIAGNOSIS — J449 Chronic obstructive pulmonary disease, unspecified: Secondary | ICD-10-CM | POA: Diagnosis not present

## 2013-04-01 DIAGNOSIS — K5730 Diverticulosis of large intestine without perforation or abscess without bleeding: Secondary | ICD-10-CM | POA: Diagnosis not present

## 2013-04-01 DIAGNOSIS — E7800 Pure hypercholesterolemia, unspecified: Secondary | ICD-10-CM | POA: Diagnosis not present

## 2013-04-01 DIAGNOSIS — K620 Anal polyp: Secondary | ICD-10-CM | POA: Diagnosis not present

## 2013-04-01 DIAGNOSIS — K449 Diaphragmatic hernia without obstruction or gangrene: Secondary | ICD-10-CM | POA: Diagnosis not present

## 2013-04-01 DIAGNOSIS — D128 Benign neoplasm of rectum: Secondary | ICD-10-CM | POA: Diagnosis not present

## 2013-04-01 DIAGNOSIS — I252 Old myocardial infarction: Secondary | ICD-10-CM | POA: Diagnosis not present

## 2013-04-01 DIAGNOSIS — K219 Gastro-esophageal reflux disease without esophagitis: Secondary | ICD-10-CM | POA: Diagnosis not present

## 2013-04-11 ENCOUNTER — Encounter: Payer: Self-pay | Admitting: Sports Medicine

## 2013-04-15 ENCOUNTER — Other Ambulatory Visit: Payer: Self-pay | Admitting: Sports Medicine

## 2013-04-15 DIAGNOSIS — I10 Essential (primary) hypertension: Secondary | ICD-10-CM

## 2013-04-15 MED ORDER — HYDROCHLOROTHIAZIDE 12.5 MG PO CAPS: 12.5000 mg | ORAL_CAPSULE | Freq: Every day | ORAL | Status: DC

## 2013-05-01 DIAGNOSIS — R079 Chest pain, unspecified: Secondary | ICD-10-CM | POA: Diagnosis not present

## 2013-05-01 DIAGNOSIS — E785 Hyperlipidemia, unspecified: Secondary | ICD-10-CM | POA: Diagnosis not present

## 2013-05-01 DIAGNOSIS — Z86711 Personal history of pulmonary embolism: Secondary | ICD-10-CM | POA: Diagnosis not present

## 2013-05-01 DIAGNOSIS — I2582 Chronic total occlusion of coronary artery: Secondary | ICD-10-CM | POA: Diagnosis not present

## 2013-05-01 DIAGNOSIS — N400 Enlarged prostate without lower urinary tract symptoms: Secondary | ICD-10-CM | POA: Diagnosis not present

## 2013-05-01 DIAGNOSIS — E119 Type 2 diabetes mellitus without complications: Secondary | ICD-10-CM | POA: Diagnosis not present

## 2013-05-01 DIAGNOSIS — R0789 Other chest pain: Secondary | ICD-10-CM | POA: Diagnosis not present

## 2013-05-01 DIAGNOSIS — R42 Dizziness and giddiness: Secondary | ICD-10-CM | POA: Diagnosis not present

## 2013-05-01 DIAGNOSIS — I2510 Atherosclerotic heart disease of native coronary artery without angina pectoris: Secondary | ICD-10-CM | POA: Diagnosis not present

## 2013-05-01 DIAGNOSIS — K219 Gastro-esophageal reflux disease without esophagitis: Secondary | ICD-10-CM | POA: Diagnosis not present

## 2013-05-01 DIAGNOSIS — R319 Hematuria, unspecified: Secondary | ICD-10-CM | POA: Diagnosis not present

## 2013-05-01 DIAGNOSIS — Z87891 Personal history of nicotine dependence: Secondary | ICD-10-CM | POA: Diagnosis not present

## 2013-05-01 DIAGNOSIS — R911 Solitary pulmonary nodule: Secondary | ICD-10-CM | POA: Diagnosis not present

## 2013-05-01 DIAGNOSIS — I252 Old myocardial infarction: Secondary | ICD-10-CM | POA: Diagnosis not present

## 2013-05-01 DIAGNOSIS — J449 Chronic obstructive pulmonary disease, unspecified: Secondary | ICD-10-CM | POA: Diagnosis not present

## 2013-05-01 DIAGNOSIS — R0602 Shortness of breath: Secondary | ICD-10-CM | POA: Diagnosis not present

## 2013-05-01 DIAGNOSIS — I259 Chronic ischemic heart disease, unspecified: Secondary | ICD-10-CM | POA: Diagnosis not present

## 2013-05-01 DIAGNOSIS — I10 Essential (primary) hypertension: Secondary | ICD-10-CM | POA: Diagnosis not present

## 2013-05-02 DIAGNOSIS — M7989 Other specified soft tissue disorders: Secondary | ICD-10-CM | POA: Diagnosis not present

## 2013-05-02 DIAGNOSIS — R42 Dizziness and giddiness: Secondary | ICD-10-CM | POA: Diagnosis not present

## 2013-05-02 DIAGNOSIS — I447 Left bundle-branch block, unspecified: Secondary | ICD-10-CM | POA: Diagnosis not present

## 2013-05-02 DIAGNOSIS — R079 Chest pain, unspecified: Secondary | ICD-10-CM | POA: Diagnosis not present

## 2013-05-02 DIAGNOSIS — E785 Hyperlipidemia, unspecified: Secondary | ICD-10-CM | POA: Diagnosis not present

## 2013-05-02 DIAGNOSIS — I517 Cardiomegaly: Secondary | ICD-10-CM | POA: Diagnosis not present

## 2013-05-02 DIAGNOSIS — I079 Rheumatic tricuspid valve disease, unspecified: Secondary | ICD-10-CM | POA: Diagnosis not present

## 2013-05-02 DIAGNOSIS — I359 Nonrheumatic aortic valve disorder, unspecified: Secondary | ICD-10-CM | POA: Diagnosis not present

## 2013-05-03 DIAGNOSIS — I10 Essential (primary) hypertension: Secondary | ICD-10-CM | POA: Diagnosis not present

## 2013-05-03 DIAGNOSIS — I2582 Chronic total occlusion of coronary artery: Secondary | ICD-10-CM | POA: Diagnosis not present

## 2013-05-03 DIAGNOSIS — R079 Chest pain, unspecified: Secondary | ICD-10-CM | POA: Diagnosis not present

## 2013-05-03 DIAGNOSIS — E119 Type 2 diabetes mellitus without complications: Secondary | ICD-10-CM | POA: Diagnosis not present

## 2013-05-03 DIAGNOSIS — I2510 Atherosclerotic heart disease of native coronary artery without angina pectoris: Secondary | ICD-10-CM | POA: Diagnosis not present

## 2013-05-03 DIAGNOSIS — J449 Chronic obstructive pulmonary disease, unspecified: Secondary | ICD-10-CM | POA: Diagnosis not present

## 2013-05-03 DIAGNOSIS — I259 Chronic ischemic heart disease, unspecified: Secondary | ICD-10-CM | POA: Diagnosis not present

## 2013-05-03 DIAGNOSIS — Z049 Encounter for examination and observation for unspecified reason: Secondary | ICD-10-CM | POA: Diagnosis not present

## 2013-05-08 DIAGNOSIS — I2510 Atherosclerotic heart disease of native coronary artery without angina pectoris: Secondary | ICD-10-CM | POA: Diagnosis not present

## 2013-05-08 DIAGNOSIS — I10 Essential (primary) hypertension: Secondary | ICD-10-CM | POA: Diagnosis not present

## 2013-05-08 DIAGNOSIS — I259 Chronic ischemic heart disease, unspecified: Secondary | ICD-10-CM | POA: Diagnosis not present

## 2013-05-08 DIAGNOSIS — I2699 Other pulmonary embolism without acute cor pulmonale: Secondary | ICD-10-CM | POA: Diagnosis not present

## 2013-05-08 DIAGNOSIS — I4510 Unspecified right bundle-branch block: Secondary | ICD-10-CM | POA: Insufficient documentation

## 2013-05-08 DIAGNOSIS — I209 Angina pectoris, unspecified: Secondary | ICD-10-CM | POA: Diagnosis not present

## 2013-05-08 DIAGNOSIS — J449 Chronic obstructive pulmonary disease, unspecified: Secondary | ICD-10-CM | POA: Diagnosis not present

## 2013-05-08 DIAGNOSIS — E119 Type 2 diabetes mellitus without complications: Secondary | ICD-10-CM | POA: Diagnosis not present

## 2013-05-09 ENCOUNTER — Encounter: Payer: Self-pay | Admitting: Sports Medicine

## 2013-05-10 ENCOUNTER — Encounter: Payer: Self-pay | Admitting: Sports Medicine

## 2013-05-10 DIAGNOSIS — Z9861 Coronary angioplasty status: Secondary | ICD-10-CM

## 2013-05-10 DIAGNOSIS — I2510 Atherosclerotic heart disease of native coronary artery without angina pectoris: Secondary | ICD-10-CM | POA: Insufficient documentation

## 2013-06-05 DIAGNOSIS — R911 Solitary pulmonary nodule: Secondary | ICD-10-CM | POA: Diagnosis not present

## 2013-06-05 DIAGNOSIS — G4710 Hypersomnia, unspecified: Secondary | ICD-10-CM | POA: Diagnosis not present

## 2013-06-05 DIAGNOSIS — R0609 Other forms of dyspnea: Secondary | ICD-10-CM | POA: Diagnosis not present

## 2013-06-05 DIAGNOSIS — J449 Chronic obstructive pulmonary disease, unspecified: Secondary | ICD-10-CM | POA: Diagnosis not present

## 2013-06-23 DIAGNOSIS — R0609 Other forms of dyspnea: Secondary | ICD-10-CM | POA: Diagnosis not present

## 2013-06-23 DIAGNOSIS — G4710 Hypersomnia, unspecified: Secondary | ICD-10-CM | POA: Diagnosis not present

## 2013-06-23 DIAGNOSIS — R0989 Other specified symptoms and signs involving the circulatory and respiratory systems: Secondary | ICD-10-CM | POA: Diagnosis not present

## 2013-06-28 DIAGNOSIS — R911 Solitary pulmonary nodule: Secondary | ICD-10-CM | POA: Diagnosis not present

## 2013-06-28 DIAGNOSIS — G4733 Obstructive sleep apnea (adult) (pediatric): Secondary | ICD-10-CM | POA: Diagnosis not present

## 2013-06-28 DIAGNOSIS — J449 Chronic obstructive pulmonary disease, unspecified: Secondary | ICD-10-CM | POA: Diagnosis not present

## 2013-07-05 DIAGNOSIS — R911 Solitary pulmonary nodule: Secondary | ICD-10-CM | POA: Diagnosis not present

## 2013-07-05 DIAGNOSIS — J479 Bronchiectasis, uncomplicated: Secondary | ICD-10-CM | POA: Diagnosis not present

## 2013-07-05 DIAGNOSIS — R918 Other nonspecific abnormal finding of lung field: Secondary | ICD-10-CM | POA: Diagnosis not present

## 2013-07-09 ENCOUNTER — Encounter: Payer: Self-pay | Admitting: Sports Medicine

## 2013-07-09 ENCOUNTER — Other Ambulatory Visit: Payer: Self-pay

## 2013-07-09 MED ORDER — ISOSORBIDE MONONITRATE ER 30 MG PO TB24: 30.0000 mg | ORAL_TABLET | Freq: Two times a day (BID) | ORAL | Status: DC

## 2013-07-22 ENCOUNTER — Other Ambulatory Visit: Payer: Self-pay | Admitting: Sports Medicine

## 2013-07-23 ENCOUNTER — Other Ambulatory Visit: Payer: Self-pay | Admitting: Sports Medicine

## 2013-07-23 DIAGNOSIS — I10 Essential (primary) hypertension: Secondary | ICD-10-CM

## 2013-07-23 MED ORDER — CARVEDILOL 6.25 MG PO TABS: 6.2500 mg | ORAL_TABLET | Freq: Two times a day (BID) | ORAL | Status: DC

## 2013-07-24 ENCOUNTER — Other Ambulatory Visit: Payer: Self-pay | Admitting: Sports Medicine

## 2013-07-24 DIAGNOSIS — I10 Essential (primary) hypertension: Secondary | ICD-10-CM

## 2013-07-24 MED ORDER — CARVEDILOL 6.25 MG PO TABS: 6.2500 mg | ORAL_TABLET | Freq: Two times a day (BID) | ORAL | Status: DC

## 2013-07-26 DIAGNOSIS — G4733 Obstructive sleep apnea (adult) (pediatric): Secondary | ICD-10-CM | POA: Diagnosis not present

## 2013-07-26 DIAGNOSIS — J449 Chronic obstructive pulmonary disease, unspecified: Secondary | ICD-10-CM | POA: Diagnosis not present

## 2013-08-02 DIAGNOSIS — R0602 Shortness of breath: Secondary | ICD-10-CM | POA: Diagnosis not present

## 2013-08-02 DIAGNOSIS — G4733 Obstructive sleep apnea (adult) (pediatric): Secondary | ICD-10-CM | POA: Diagnosis not present

## 2013-08-02 DIAGNOSIS — G4710 Hypersomnia, unspecified: Secondary | ICD-10-CM | POA: Diagnosis not present

## 2013-08-02 DIAGNOSIS — R911 Solitary pulmonary nodule: Secondary | ICD-10-CM | POA: Diagnosis not present

## 2013-08-02 DIAGNOSIS — J449 Chronic obstructive pulmonary disease, unspecified: Secondary | ICD-10-CM | POA: Diagnosis not present

## 2013-08-12 DIAGNOSIS — E785 Hyperlipidemia, unspecified: Secondary | ICD-10-CM | POA: Diagnosis not present

## 2013-08-12 DIAGNOSIS — I209 Angina pectoris, unspecified: Secondary | ICD-10-CM | POA: Diagnosis not present

## 2013-08-12 DIAGNOSIS — J4489 Other specified chronic obstructive pulmonary disease: Secondary | ICD-10-CM | POA: Diagnosis not present

## 2013-08-12 DIAGNOSIS — I259 Chronic ischemic heart disease, unspecified: Secondary | ICD-10-CM | POA: Diagnosis not present

## 2013-08-12 DIAGNOSIS — G4730 Sleep apnea, unspecified: Secondary | ICD-10-CM | POA: Diagnosis not present

## 2013-08-12 DIAGNOSIS — I10 Essential (primary) hypertension: Secondary | ICD-10-CM | POA: Diagnosis not present

## 2013-08-12 DIAGNOSIS — J449 Chronic obstructive pulmonary disease, unspecified: Secondary | ICD-10-CM | POA: Diagnosis not present

## 2013-08-12 DIAGNOSIS — I4510 Unspecified right bundle-branch block: Secondary | ICD-10-CM | POA: Diagnosis not present

## 2013-08-27 DIAGNOSIS — L578 Other skin changes due to chronic exposure to nonionizing radiation: Secondary | ICD-10-CM | POA: Diagnosis not present

## 2013-08-27 DIAGNOSIS — Z85828 Personal history of other malignant neoplasm of skin: Secondary | ICD-10-CM | POA: Diagnosis not present

## 2013-08-27 DIAGNOSIS — L219 Seborrheic dermatitis, unspecified: Secondary | ICD-10-CM | POA: Diagnosis not present

## 2013-08-27 DIAGNOSIS — D235 Other benign neoplasm of skin of trunk: Secondary | ICD-10-CM | POA: Diagnosis not present

## 2013-08-27 DIAGNOSIS — L819 Disorder of pigmentation, unspecified: Secondary | ICD-10-CM | POA: Diagnosis not present

## 2013-09-13 DIAGNOSIS — R0609 Other forms of dyspnea: Secondary | ICD-10-CM | POA: Diagnosis not present

## 2013-09-13 DIAGNOSIS — R0602 Shortness of breath: Secondary | ICD-10-CM | POA: Diagnosis not present

## 2013-09-13 DIAGNOSIS — R918 Other nonspecific abnormal finding of lung field: Secondary | ICD-10-CM | POA: Diagnosis not present

## 2013-09-13 DIAGNOSIS — R0989 Other specified symptoms and signs involving the circulatory and respiratory systems: Secondary | ICD-10-CM | POA: Diagnosis not present

## 2013-09-13 DIAGNOSIS — G4733 Obstructive sleep apnea (adult) (pediatric): Secondary | ICD-10-CM | POA: Diagnosis not present

## 2013-10-07 DIAGNOSIS — J479 Bronchiectasis, uncomplicated: Secondary | ICD-10-CM | POA: Diagnosis not present

## 2013-10-07 DIAGNOSIS — R918 Other nonspecific abnormal finding of lung field: Secondary | ICD-10-CM | POA: Diagnosis not present

## 2013-10-14 DIAGNOSIS — G4733 Obstructive sleep apnea (adult) (pediatric): Secondary | ICD-10-CM | POA: Diagnosis not present

## 2013-10-15 DIAGNOSIS — R062 Wheezing: Secondary | ICD-10-CM | POA: Diagnosis not present

## 2013-10-15 DIAGNOSIS — J441 Chronic obstructive pulmonary disease with (acute) exacerbation: Secondary | ICD-10-CM | POA: Diagnosis not present

## 2013-10-15 DIAGNOSIS — R911 Solitary pulmonary nodule: Secondary | ICD-10-CM | POA: Diagnosis not present

## 2013-10-15 DIAGNOSIS — R059 Cough, unspecified: Secondary | ICD-10-CM | POA: Diagnosis not present

## 2013-10-15 DIAGNOSIS — R05 Cough: Secondary | ICD-10-CM | POA: Diagnosis not present

## 2013-10-15 DIAGNOSIS — R0602 Shortness of breath: Secondary | ICD-10-CM | POA: Diagnosis not present

## 2013-10-21 ENCOUNTER — Other Ambulatory Visit: Payer: Self-pay | Admitting: Sports Medicine

## 2013-10-22 ENCOUNTER — Encounter: Payer: Self-pay | Admitting: Sports Medicine

## 2013-10-22 ENCOUNTER — Other Ambulatory Visit: Payer: Self-pay

## 2013-10-22 MED ORDER — HYDROCHLOROTHIAZIDE 12.5 MG PO CAPS: ORAL_CAPSULE | ORAL | Status: DC

## 2013-11-12 DIAGNOSIS — E119 Type 2 diabetes mellitus without complications: Secondary | ICD-10-CM | POA: Diagnosis not present

## 2013-11-12 DIAGNOSIS — J438 Other emphysema: Secondary | ICD-10-CM | POA: Diagnosis not present

## 2013-11-12 DIAGNOSIS — I10 Essential (primary) hypertension: Secondary | ICD-10-CM | POA: Diagnosis not present

## 2013-11-12 DIAGNOSIS — I259 Chronic ischemic heart disease, unspecified: Secondary | ICD-10-CM | POA: Diagnosis not present

## 2013-11-12 DIAGNOSIS — I209 Angina pectoris, unspecified: Secondary | ICD-10-CM | POA: Diagnosis not present

## 2013-11-12 DIAGNOSIS — J439 Emphysema, unspecified: Secondary | ICD-10-CM | POA: Insufficient documentation

## 2013-11-12 DIAGNOSIS — G4730 Sleep apnea, unspecified: Secondary | ICD-10-CM | POA: Diagnosis not present

## 2013-12-27 ENCOUNTER — Ambulatory Visit (INDEPENDENT_AMBULATORY_CARE_PROVIDER_SITE_OTHER): Payer: Medicare Other | Admitting: Sports Medicine

## 2013-12-27 ENCOUNTER — Encounter: Payer: Self-pay | Admitting: Sports Medicine

## 2013-12-27 VITALS — BP 111/64 | HR 53 | Ht 72.0 in | Wt 196.0 lb

## 2013-12-27 DIAGNOSIS — J4489 Other specified chronic obstructive pulmonary disease: Secondary | ICD-10-CM

## 2013-12-27 DIAGNOSIS — Z131 Encounter for screening for diabetes mellitus: Secondary | ICD-10-CM

## 2013-12-27 DIAGNOSIS — H6123 Impacted cerumen, bilateral: Secondary | ICD-10-CM | POA: Diagnosis not present

## 2013-12-27 DIAGNOSIS — H6120 Impacted cerumen, unspecified ear: Secondary | ICD-10-CM | POA: Insufficient documentation

## 2013-12-27 DIAGNOSIS — Z0000 Encounter for general adult medical examination without abnormal findings: Secondary | ICD-10-CM | POA: Diagnosis not present

## 2013-12-27 DIAGNOSIS — J449 Chronic obstructive pulmonary disease, unspecified: Secondary | ICD-10-CM

## 2013-12-27 DIAGNOSIS — K219 Gastro-esophageal reflux disease without esophagitis: Secondary | ICD-10-CM | POA: Insufficient documentation

## 2013-12-27 DIAGNOSIS — Z23 Encounter for immunization: Secondary | ICD-10-CM | POA: Diagnosis not present

## 2013-12-27 DIAGNOSIS — E785 Hyperlipidemia, unspecified: Secondary | ICD-10-CM

## 2013-12-27 DIAGNOSIS — R7309 Other abnormal glucose: Secondary | ICD-10-CM | POA: Diagnosis not present

## 2013-12-27 DIAGNOSIS — I10 Essential (primary) hypertension: Secondary | ICD-10-CM

## 2013-12-27 DIAGNOSIS — R739 Hyperglycemia, unspecified: Secondary | ICD-10-CM | POA: Diagnosis not present

## 2013-12-27 DIAGNOSIS — M5030 Other cervical disc degeneration, unspecified cervical region: Secondary | ICD-10-CM | POA: Diagnosis not present

## 2013-12-27 DIAGNOSIS — G4733 Obstructive sleep apnea (adult) (pediatric): Secondary | ICD-10-CM | POA: Diagnosis not present

## 2013-12-27 LAB — COMPREHENSIVE METABOLIC PANEL WITH GFR
ALT: 23 U/L (ref 0–53)
Albumin: 3.8 g/dL (ref 3.5–5.2)
Alkaline Phosphatase: 101 U/L (ref 39–117)
CO2: 27 meq/L (ref 19–32)
Potassium: 3.5 meq/L (ref 3.5–5.3)
Sodium: 140 meq/L (ref 135–145)
Total Bilirubin: 0.6 mg/dL (ref 0.2–1.2)
Total Protein: 7.4 g/dL (ref 6.0–8.3)

## 2013-12-27 LAB — LIPID PANEL
Cholesterol: 137 mg/dL (ref 0–200)
HDL: 32 mg/dL — ABNORMAL LOW (ref >39)
LDL Cholesterol: 64 mg/dL (ref 0–99)
Total CHOL/HDL Ratio: 4.3 Ratio
Triglycerides: 206 mg/dL — ABNORMAL HIGH (ref <150)
VLDL: 41 mg/dL — ABNORMAL HIGH (ref 0–40)

## 2013-12-27 LAB — COMPREHENSIVE METABOLIC PANEL
AST: 19 U/L (ref 0–37)
BUN: 17 mg/dL (ref 6–23)
Calcium: 9.3 mg/dL (ref 8.4–10.5)
Chloride: 101 mEq/L (ref 96–112)
Creat: 1.2 mg/dL (ref 0.50–1.35)
Glucose, Bld: 92 mg/dL (ref 70–99)

## 2013-12-27 LAB — CBC
HCT: 34.3 % — ABNORMAL LOW (ref 39.0–52.0)
Hemoglobin: 11.2 g/dL — ABNORMAL LOW (ref 13.0–17.0)
MCH: 28.7 pg (ref 26.0–34.0)
MCHC: 32.7 g/dL (ref 30.0–36.0)
MCV: 87.9 fL (ref 78.0–100.0)
Platelets: 228 K/uL (ref 150–400)
RBC: 3.9 MIL/uL — ABNORMAL LOW (ref 4.22–5.81)
RDW: 15.3 % (ref 11.5–15.5)
WBC: 5.4 K/uL (ref 4.0–10.5)

## 2013-12-27 LAB — TSH: TSH: 1.412 u[IU]/mL (ref 0.350–4.500)

## 2013-12-27 MED ORDER — LORAZEPAM 1 MG PO TABS: 1.0000 mg | ORAL_TABLET | Freq: Every day | ORAL | Status: DC

## 2013-12-27 MED ORDER — RANITIDINE HCL 300 MG PO TABS: 300.0000 mg | ORAL_TABLET | Freq: Two times a day (BID) | ORAL | Status: DC

## 2013-12-27 NOTE — Assessment & Plan Note (Signed)
Predominantly hypertriglyceridemia now. He will try dietary modification while on simvastatin, and then we can recheck in 3 months for consideration of adding niacin.

## 2013-12-27 NOTE — Assessment & Plan Note (Signed)
Well controlled, no changes 

## 2013-12-27 NOTE — Assessment & Plan Note (Signed)
Initially on Xarelto, now that he is off of Xarelto we can proceed with further epidurals when he desires.

## 2013-12-27 NOTE — Assessment & Plan Note (Signed)
Persistent despite pantoprazole. Adding ranitidine twice a day.

## 2013-12-27 NOTE — Assessment & Plan Note (Signed)
Bilateral irrigation.

## 2013-12-27 NOTE — Assessment & Plan Note (Signed)
Significantly improved with CPAP however having difficulty sleeping with the mask understandably. Adding Ativan at bedtime. Patient will have the freedom to increase the dose as needed.

## 2013-12-27 NOTE — Assessment & Plan Note (Signed)
Stable, asymptomatic. 

## 2013-12-27 NOTE — Progress Notes (Signed)
Subjective:    [name] [name] is a 73 y.o. male who presents for Medicare Annual/Subsequent preventive examination.   Preventive Screening-Counseling & Management  Tobacco History  Smoking status  . Current Some Day Smoker  . Types: Pipe  Smokeless tobacco  . Not on file    Problems Prior to Visit GERD: Wonders if we can add an additional medication.  Obstructive sleep apnea: Needs medicine to help him sleep with the CPAP mask on, Ambien, Lunesta, Sonata are not covered.  Difficulty hearing: Bilateral.  Hypertriglyceridemia: Noted on a recent study that he is enrolled in. He is amenable to trying dietary modifications before considering a specific triglyceride lowering medication.  Current Problems (verified) Patient Active Problem List   Diagnosis Date Noted  . Coronary artery disease 05/10/2013  . Hemorrhoids 03/19/2013  . Hematuria 02/21/2013  . Degenerative disc disease, cervical 02/19/2013  . Essential hypertension, benign 02/19/2013  . History of pulmonary embolism 02/19/2013  . Hyperlipidemia 02/19/2013  . Right bundle branch block 02/19/2013  . Asthma with COPD 02/19/2013  . Preventive measure 02/19/2013  . Iron deficiency anemia 02/19/2013  . Pain in limb 06/20/2012  . Disturbance of skin sensation 06/20/2012    Medications Prior to Visit Current Outpatient Prescriptions on File Prior to Visit  Medication Sig Dispense Refill  . aspirin 81 MG tablet Take 81 mg by mouth daily.    . carvedilol (COREG) 6.25 MG tablet Take 1 tablet (6.25 mg total) by mouth 2 (two) times daily with a meal. 180 tablet 3  . docusate sodium (COLACE) 100 MG capsule Take 100 mg by mouth 2 (two) times daily.    . ferrous sulfate 325 (65 FE) MG EC tablet Take 1 tablet (325 mg total) by mouth 3 (three) times daily with meals. 90 tablet 11  . Fluticasone-Salmeterol (ADVAIR) 250-50 MCG/DOSE AEPB Inhale 1 puff into the lungs every 12 (twelve) hours.    . hydrochlorothiazide (MICROZIDE)  12.5 MG capsule TAKE ONE CAPSULE BY MOUTH ONCE DAILY 90 capsule 0  . hydrocortisone (ANUSOL-HC) 25 MG suppository Place 1 suppository (25 mg total) rectally 2 (two) times daily as needed for hemorrhoids or itching. 24 suppository 2  . isosorbide mononitrate (IMDUR) 30 MG 24 hr tablet Take 1 tablet (30 mg total) by mouth 2 (two) times daily. 180 tablet 3  . montelukast (SINGULAIR) 10 MG tablet Take 1 tablet (10 mg total) by mouth at bedtime. 90 tablet 3  . Multiple Vitamin (MULTIVITAMIN) tablet Take 1 tablet by mouth daily.    . nitroGLYCERIN (NITROSTAT) 0.4 MG SL tablet Place 0.4 mg under the tongue every 5 (five) minutes as needed. For chest pain    . oseltamivir (TAMIFLU) 75 MG capsule Take 1 capsule (75 mg total) by mouth every 12 (twelve) hours. 10 capsule 0  . pantoprazole (PROTONIX) 40 MG tablet Take 1 tablet (40 mg total) by mouth every morning. 90 tablet 3  . simvastatin (ZOCOR) 20 MG tablet Take 1 tablet (20 mg total) by mouth every evening. 90 tablet 3  . valsartan (DIOVAN) 320 MG tablet Take 1 tablet (320 mg total) by mouth every morning. 90 tablet 3   No current facility-administered medications on file prior to visit.    Current Medications (verified) Current Outpatient Prescriptions  Medication Sig Dispense Refill  . aspirin 81 MG tablet Take 81 mg by mouth daily.    . carvedilol (COREG) 6.25 MG tablet Take 1 tablet (6.25 mg total) by mouth 2 (two) times daily with a meal. 180  tablet 3  . docusate sodium (COLACE) 100 MG capsule Take 100 mg by mouth 2 (two) times daily.    . ferrous sulfate 325 (65 FE) MG EC tablet Take 1 tablet (325 mg total) by mouth 3 (three) times daily with meals. 90 tablet 11  . Fluticasone-Salmeterol (ADVAIR) 250-50 MCG/DOSE AEPB Inhale 1 puff into the lungs every 12 (twelve) hours.    . hydrochlorothiazide (MICROZIDE) 12.5 MG capsule TAKE ONE CAPSULE BY MOUTH ONCE DAILY 90 capsule 0  . hydrocortisone (ANUSOL-HC) 25 MG suppository Place 1 suppository (25 mg  total) rectally 2 (two) times daily as needed for hemorrhoids or itching. 24 suppository 2  . isosorbide mononitrate (IMDUR) 30 MG 24 hr tablet Take 1 tablet (30 mg total) by mouth 2 (two) times daily. 180 tablet 3  . montelukast (SINGULAIR) 10 MG tablet Take 1 tablet (10 mg total) by mouth at bedtime. 90 tablet 3  . Multiple Vitamin (MULTIVITAMIN) tablet Take 1 tablet by mouth daily.    . nitroGLYCERIN (NITROSTAT) 0.4 MG SL tablet Place 0.4 mg under the tongue every 5 (five) minutes as needed. For chest pain    . oseltamivir (TAMIFLU) 75 MG capsule Take 1 capsule (75 mg total) by mouth every 12 (twelve) hours. 10 capsule 0  . pantoprazole (PROTONIX) 40 MG tablet Take 1 tablet (40 mg total) by mouth every morning. 90 tablet 3  . simvastatin (ZOCOR) 20 MG tablet Take 1 tablet (20 mg total) by mouth every evening. 90 tablet 3  . valsartan (DIOVAN) 320 MG tablet Take 1 tablet (320 mg total) by mouth every morning. 90 tablet 3   No current facility-administered medications for this visit.     Allergies (verified) Codeine and Mushroom extract complex   PAST HISTORY  Family History Family History  Problem Relation Age of Onset  . Cancer Sister   . Cancer Sister   . Hypertension Mother   . Heart attack Father     Social History History  Substance Use Topics  . Smoking status: Current Some Day Smoker    Types: Pipe  . Smokeless tobacco: Not on file  . Alcohol Use: Yes     Comment: 1-2 glasses of wine daily    Are there smokers in your home (other than you)?  No  Risk Factors Current exercise habits: The patient does not participate in regular exercise at present.  Dietary issues discussed: Yes, working to lower hypertriglyceridemia.  Cardiac risk factors: advanced age (older than 2 for men, 57 for women), dyslipidemia and hypertension.  Depression Screen (Note: if answer to either of the following is "Yes", a more complete depression screening is indicated)   Q1: Over the past  two weeks, have you felt down, depressed or hopeless? No  Q2: Over the past two weeks, have you felt little interest or pleasure in doing things? No  Have you lost interest or pleasure in daily life? No  Do you often feel hopeless? No  Do you cry easily over simple problems? No  Activities of Daily Living In your present state of health, do you have any difficulty performing the following activities?:  Driving? No Managing money?  No Feeding yourself? No Getting from bed to chair? No Climbing a flight of stairs? No Preparing food and eating?: No Bathing or showering? No Getting dressed: No Getting to the toilet? No Using the toilet:No Moving around from place to place: No In the past year have you fallen or had a near fall?:No  Are you sexually active?  Yes  Do you have more than one partner?  No  Hearing Difficulties: No Do you often ask people to speak up or repeat themselves? No Do you experience ringing or noises in your ears? No Do you have difficulty understanding soft or whispered voices? No   Do you feel that you have a problem with memory? No  Do you often misplace items? Yes  Do you feel safe at home?  Yes  Cognitive Testing  Alert? Yes  Normal Appearance?Yes  Oriented to person? Yes  Place? Yes   Time? Yes  Recall of three objects?  Yes  Can perform simple calculations? Yes  Displays appropriate judgment?Yes  Can read the correct time from a watch face?Yes   Advanced Directives have been discussed with the patient? No   List the Names of Other Physician/Practitioners you currently use: 1.    Indicate any recent Medical Services you may have received from other than [facility] providers in the past year (date may be approximate).  Immunization History  Administered Date(s) Administered  . Influenza,inj,Quad PF,36+ Mos 12/27/2013  . Pneumococcal Polysaccharide-23 12/27/2013    Screening Tests Health Maintenance  Topic Date Due  . TETANUS/TDAP  11/22/1959   . COLONOSCOPY  11/22/1990  . ZOSTAVAX  11/21/2000  . PNEUMOCOCCAL POLYSACCHARIDE VACCINE AGE 58 AND OVER  11/21/2005  . INFLUENZA VACCINE  09/14/2013    All answers were reviewed with the patient and necessary referrals were made:  Aundria Mems, MD   12/27/2013   History reviewed: allergies, current medications, past family history, past medical history, past social history, past surgical history and problem list  Review of Systems A comprehensive review of systems was negative.    Objective:     Vision by Snellen chart: right eye:20/20, left eye:20/20 Blood pressure 111/64, pulse 53, height 6' (1.829 m), weight 196 lb (88.905 kg). Body mass index is 26.58 kg/(m^2).  General: Well Developed, well nourished, and in no acute distress.  Neuro: Alert and oriented x3, extra-ocular muscles intact, sensation grossly intact. Cranial nerves II through XII are intact, motor, sensory, and coordinative functions are all intact. HEENT: Normocephalic, atraumatic, pupils equal round reactive to light, neck supple, no masses, no lymphadenopathy, thyroid nonpalpable. Oropharynx, nasopharynx, external ear canals are unremarkable. Skin: Warm and dry, no rashes noted.  Cardiac: Regular rate and rhythm, no murmurs rubs or gallops.  Respiratory: Clear to auscultation bilaterally. Not using accessory muscles, speaking in full sentences.  Abdominal: Soft, nontender, nondistended, positive bowel sounds, no masses, no organomegaly.  Musculoskeletal: Shoulder, elbow, wrist, hip, knee, ankle stable, and with full range of motion.     Assessment:     Healthy male      Plan:     During the course of the visit the patient was educated and counseled about appropriate screening and preventive services including:    Nutrition counseling   Diet review for nutrition referral? Yes ____  Not Indicated __x__   Patient Instructions (the written plan) was given to the patient.  Medicare  Attestation I have personally reviewed: The patient's medical and social history Their use of alcohol, tobacco or illicit drugs Their current medications and supplements The patient's functional ability including ADLs,fall risks, home safety risks, cognitive, and hearing and visual impairment Diet and physical activities Evidence for depression or mood disorders  The patient's weight, height, BMI, and visual acuity have been recorded in the chart.  I have made referrals, counseling, and provided education to the  patient based on review of the above and I have provided the patient with a written personalized care plan for preventive services.     Aundria Mems, MD   12/27/2013

## 2013-12-27 NOTE — Assessment & Plan Note (Signed)
Medicare physical performed today.  

## 2013-12-28 LAB — HEMOGLOBIN A1C
Hgb A1c MFr Bld: 5.7 % — ABNORMAL HIGH (ref <5.7)
Mean Plasma Glucose: 117 mg/dL — ABNORMAL HIGH (ref <117)

## 2014-01-15 ENCOUNTER — Other Ambulatory Visit: Payer: Self-pay | Admitting: *Deleted

## 2014-01-15 MED ORDER — HYDROCHLOROTHIAZIDE 12.5 MG PO CAPS: ORAL_CAPSULE | ORAL | Status: DC

## 2014-01-16 ENCOUNTER — Other Ambulatory Visit: Payer: Self-pay | Admitting: *Deleted

## 2014-01-16 MED ORDER — MONTELUKAST SODIUM 10 MG PO TABS: 10.0000 mg | ORAL_TABLET | Freq: Every day | ORAL | Status: DC

## 2014-01-27 ENCOUNTER — Other Ambulatory Visit: Payer: Self-pay | Admitting: Sports Medicine

## 2014-01-27 DIAGNOSIS — G4733 Obstructive sleep apnea (adult) (pediatric): Secondary | ICD-10-CM

## 2014-01-27 MED ORDER — LORAZEPAM 1 MG PO TABS: 1.0000 mg | ORAL_TABLET | Freq: Every day | ORAL | Status: DC

## 2014-01-30 ENCOUNTER — Emergency Department (INDEPENDENT_AMBULATORY_CARE_PROVIDER_SITE_OTHER)
Admission: EM | Admit: 2014-01-30 | Discharge: 2014-01-30 | Disposition: A | Discharge status: Home / Self Care | Payer: Medicare Other | Source: Home / Self Care | Attending: Emergency Medicine | Admitting: Emergency Medicine

## 2014-01-30 ENCOUNTER — Encounter: Payer: Self-pay | Admitting: *Deleted

## 2014-01-30 ENCOUNTER — Emergency Department (INDEPENDENT_AMBULATORY_CARE_PROVIDER_SITE_OTHER): Payer: Medicare Other

## 2014-01-30 ENCOUNTER — Ambulatory Visit (HOSPITAL_BASED_OUTPATIENT_CLINIC_OR_DEPARTMENT_OTHER): Admit: 2014-01-30 | Payer: Medicare Other

## 2014-01-30 DIAGNOSIS — R05 Cough: Secondary | ICD-10-CM

## 2014-01-30 DIAGNOSIS — J189 Pneumonia, unspecified organism: Secondary | ICD-10-CM

## 2014-01-30 DIAGNOSIS — G4733 Obstructive sleep apnea (adult) (pediatric): Secondary | ICD-10-CM | POA: Diagnosis not present

## 2014-01-30 DIAGNOSIS — R918 Other nonspecific abnormal finding of lung field: Secondary | ICD-10-CM | POA: Diagnosis not present

## 2014-01-30 DIAGNOSIS — I259 Chronic ischemic heart disease, unspecified: Secondary | ICD-10-CM | POA: Diagnosis not present

## 2014-01-30 DIAGNOSIS — R059 Cough, unspecified: Secondary | ICD-10-CM

## 2014-01-30 DIAGNOSIS — R0789 Other chest pain: Secondary | ICD-10-CM | POA: Diagnosis not present

## 2014-01-30 DIAGNOSIS — J984 Other disorders of lung: Secondary | ICD-10-CM

## 2014-01-30 MED ORDER — LEVOFLOXACIN 500 MG PO TABS: 500.0000 mg | ORAL_TABLET | Freq: Every day | ORAL | Status: DC

## 2014-01-30 NOTE — ED Provider Notes (Signed)
CSN: [id number]     Arrival date & time 01/30/14  1133 History   First MD Initiated Contact with Patient 01/30/14 1216     Chief Complaint  Patient presents with  . Cough   (Consider location/radiation/quality/duration/timing/severity/associated sxs/prior Treatment) Patient is a 73 y.o. male presenting with cough. The history is provided by the patient. No language interpreter was used.  Cough Cough characteristics:  Productive Sputum characteristics:  Green Severity:  Moderate Onset quality:  Gradual Duration:  2 days Timing:  Constant Progression:  Worsening Chronicity:  New Smoker: no   Relieved by:  Nothing Worsened by:  Nothing tried Ineffective treatments:  None tried Associated symptoms: chest pain and shortness of breath     Past Medical History  Diagnosis Date  . Myocardial infarction 2010  . Complication of anesthesia     severe case of hiccups after hernia surgery;required medication  . Hyperlipidemia     takes Zocor daily  . Hypertension     takes HCTZ,Diovan daily and Isosorbide daily  . Asthma     uses Advair bid and asmanex prn  . Emphysema   . Pneumonia     hx of---12+yrs ago  . Chronic bronchitis     couple of yrs ago  . Dizziness     related to neck issues  . Arthritis     back  . Joint pain   . Chronic back pain     buldging disc and scoliosis  . Neck pain   . Itching     down left arm and leg  . GERD (gastroesophageal reflux disease)     takes PRotonix daily  . History of colon polyps   . Diverticulosis   . Enlarged prostate     takes Flomax daily  . Macular degeneration     mild,beginning  . Pain in limb 06/20/2012   Past Surgical History  Procedure Laterality Date  . Hernia repair  2006    x 3   . Salivary gland removed      left   . Vein removed from left leg    . Colonoscopy    . Skin cancer removed from left side of face      basal cell carcinoma  . Cardiac catheterization  2010/2013    06/07/11 [facility]) LAD 25%, D2 75%  (small), pLCx 100%, RCA 25%--medical managment   . Anterior cervical decomp/discectomy fusion  01/31/2012    Procedure: ANTERIOR CERVICAL DECOMPRESSION/DISCECTOMY FUSION 1 LEVEL;  Surgeon: Floyce Stakes, MD;  Location: [facility];  Service: Neurosurgery;  Laterality: N/A;  Cervical Five-Six  Anterior Cervical Decompression /Diskectomy /Fusion/Plate   Family History  Problem Relation Age of Onset  . Cancer Sister   . Cancer Sister   . Hypertension Mother   . Heart attack Father    History  Substance Use Topics  . Smoking status: Current Some Day Smoker    Types: Pipe  . Smokeless tobacco: Not on file  . Alcohol Use: Yes     Comment: 1-2 glasses of wine daily    Review of Systems  Respiratory: Positive for cough and shortness of breath.   Cardiovascular: Positive for chest pain.  All other systems reviewed and are negative.   Allergies  Codeine and Mushroom extract complex  Home Medications   Prior to Admission medications   Medication Sig Start Date End Date Taking? Authorizing Provider  aspirin 81 MG tablet Take 81 mg by mouth daily.    Historical Provider, MD  carvedilol (COREG) 6.25 MG tablet Take 1 tablet (6.25 mg total) by mouth 2 (two) times daily with a meal. 07/24/13   Silverio Decamp, MD  docusate sodium (COLACE) 100 MG capsule Take 100 mg by mouth 2 (two) times daily.    Historical Provider, MD  ferrous sulfate 325 (65 FE) MG EC tablet Take 1 tablet (325 mg total) by mouth 3 (three) times daily with meals. 02/21/13   Silverio Decamp, MD  Fluticasone-Salmeterol (ADVAIR) 250-50 MCG/DOSE AEPB Inhale 1 puff into the lungs every 12 (twelve) hours.    Historical Provider, MD  hydrochlorothiazide (MICROZIDE) 12.5 MG capsule TAKE ONE CAPSULE BY MOUTH ONCE DAILY 01/15/14   Silverio Decamp, MD  hydrocortisone (ANUSOL-HC) 25 MG suppository Place 1 suppository (25 mg total) rectally 2 (two) times daily as needed for hemorrhoids or itching. 03/19/13   Silverio Decamp, MD  isosorbide mononitrate (IMDUR) 30 MG 24 hr tablet Take 1 tablet (30 mg total) by mouth 2 (two) times daily. 07/09/13   Silverio Decamp, MD  LORazepam (ATIVAN) 1 MG tablet Take 1 tablet (1 mg total) by mouth at bedtime. 01/27/14   Silverio Decamp, MD  montelukast (SINGULAIR) 10 MG tablet Take 1 tablet (10 mg total) by mouth at bedtime. 01/16/14   Silverio Decamp, MD  Multiple Vitamin (MULTIVITAMIN) tablet Take 1 tablet by mouth daily.    Historical Provider, MD  nitroGLYCERIN (NITROSTAT) 0.4 MG SL tablet Place 0.4 mg under the tongue every 5 (five) minutes as needed. For chest pain    Historical Provider, MD  oseltamivir (TAMIFLU) 75 MG capsule Take 1 capsule (75 mg total) by mouth every 12 (twelve) hours. 03/24/13   Kandra Nicolas, MD  pantoprazole (PROTONIX) 40 MG tablet Take 1 tablet (40 mg total) by mouth every morning. 02/19/13   Silverio Decamp, MD  ranitidine (ZANTAC) 300 MG tablet Take 1 tablet (300 mg total) by mouth 2 (two) times daily. 12/27/13   Silverio Decamp, MD  simvastatin (ZOCOR) 20 MG tablet Take 1 tablet (20 mg total) by mouth every evening. 02/19/13   Silverio Decamp, MD  valsartan (DIOVAN) 320 MG tablet Take 1 tablet (320 mg total) by mouth every morning. 02/19/13   Silverio Decamp, MD   BP 110/66 mmHg  Pulse 66  Temp(Src) 97.6 F (36.4 C) (Oral)  Resp 18  Ht 6' (1.829 m)  Wt 199 lb (90.266 kg)  BMI 26.98 kg/m2  SpO2 96% Physical Exam  Constitutional: He is oriented to person, place, and time. He appears well-developed and well-nourished.  HENT:  Head: Normocephalic and atraumatic.  Right Ear: External ear normal.  Left Ear: External ear normal.  Nose: Nose normal.  Mouth/Throat: Oropharynx is clear and moist.  Eyes: Conjunctivae and EOM are normal. Pupils are equal, round, and reactive to light.  Neck: Normal range of motion.  Cardiovascular: Normal rate and normal heart sounds.   Pulmonary/Chest: Effort normal.   Abdominal: Soft. He exhibits no distension.  Musculoskeletal: Normal range of motion.  Neurological: He is alert and oriented to person, place, and time.  Skin: Skin is warm.  Psychiatric: He has a normal mood and affect.  Nursing note and vitals reviewed.   ED Course  Procedures (including critical care time) Labs Review Labs Reviewed - No data to display  Imaging Review Dg Chest 2 View  01/30/2014   CLINICAL DATA:  Cough and fever.  EXAM: CHEST  2 VIEW  COMPARISON:  None.  FINDINGS: Mediastinum and hilar structures normal. Ill-defined density left mid lung most likely infiltrate. Mild increased density left lower lobe. Close follow-up chest x-rays recommended demonstrate clearing. Basal pleural parenchymal thickening consistent with scarring. Bilateral pleural parenchymal thickening consistent with scarring. Heart size pulmonary vascularity normal. Prior cervical spine fusion.  IMPRESSION: 1. Ill-defined density left mid lung, most likely pulmonary infiltrate. Close follow-up chest x-rays recommended to demonstrate clearing. 2. Ill-defined density left lower lobe possibly infiltrate, again close follow-up chest x-rays recommended 2 demonstrate clearing. 3. Bibasilar pleural parenchymal scarring.   Electronically Signed   By: Marcello Moores  Register   On: 01/30/2014 12:52     MDM  patient has a history of pneumonia and pulmonary embolus. Patient recently traveled.    1. CAP (community acquired pneumonia)   2. Cough   3. Chest discomfort    Spoke with patient's pulmonologist Dr Freda Munro, I am concerned that patient could possibly have a pulmonary embolus as he has had a pulmonary embolus in the past. Chest x-ray is suspicious for pneumonia. I offered patient option of CT angiogram at [facility] [place]. he declined. Patient's pulmonologist advised to have patient come to the office at this time for evaluation, so will not initiate antibiotic therapy.    Fransico Meadow, PA-C 01/30/14  [place], [place] 01/30/14 [phone]

## 2014-01-30 NOTE — ED Notes (Addendum)
Pt c/o cough, fever and chills x 2 days.  He also c/o LT rib area sharp pain with a of PE and pneumonia.

## 2014-01-30 NOTE — Discharge Instructions (Signed)
[facility] now to see Dr. Ranae Plumber.

## 2014-02-05 DIAGNOSIS — J189 Pneumonia, unspecified organism: Secondary | ICD-10-CM | POA: Diagnosis not present

## 2014-02-05 DIAGNOSIS — I259 Chronic ischemic heart disease, unspecified: Secondary | ICD-10-CM | POA: Diagnosis not present

## 2014-02-20 DIAGNOSIS — R0602 Shortness of breath: Secondary | ICD-10-CM | POA: Diagnosis not present

## 2014-02-20 DIAGNOSIS — G4733 Obstructive sleep apnea (adult) (pediatric): Secondary | ICD-10-CM | POA: Diagnosis not present

## 2014-02-20 DIAGNOSIS — R918 Other nonspecific abnormal finding of lung field: Secondary | ICD-10-CM | POA: Diagnosis not present

## 2014-02-27 DIAGNOSIS — I4510 Unspecified right bundle-branch block: Secondary | ICD-10-CM | POA: Diagnosis not present

## 2014-02-27 DIAGNOSIS — I10 Essential (primary) hypertension: Secondary | ICD-10-CM | POA: Diagnosis not present

## 2014-02-27 DIAGNOSIS — I2582 Chronic total occlusion of coronary artery: Secondary | ICD-10-CM | POA: Diagnosis not present

## 2014-02-27 DIAGNOSIS — I240 Acute coronary thrombosis not resulting in myocardial infarction: Secondary | ICD-10-CM | POA: Insufficient documentation

## 2014-02-27 DIAGNOSIS — I209 Angina pectoris, unspecified: Secondary | ICD-10-CM | POA: Diagnosis not present

## 2014-02-28 DIAGNOSIS — N400 Enlarged prostate without lower urinary tract symptoms: Secondary | ICD-10-CM | POA: Diagnosis not present

## 2014-02-28 DIAGNOSIS — Z885 Allergy status to narcotic agent status: Secondary | ICD-10-CM | POA: Diagnosis not present

## 2014-02-28 DIAGNOSIS — Z7982 Long term (current) use of aspirin: Secondary | ICD-10-CM | POA: Diagnosis not present

## 2014-02-28 DIAGNOSIS — I252 Old myocardial infarction: Secondary | ICD-10-CM | POA: Diagnosis not present

## 2014-02-28 DIAGNOSIS — Z833 Family history of diabetes mellitus: Secondary | ICD-10-CM | POA: Diagnosis not present

## 2014-02-28 DIAGNOSIS — I10 Essential (primary) hypertension: Secondary | ICD-10-CM | POA: Diagnosis not present

## 2014-02-28 DIAGNOSIS — I452 Bifascicular block: Secondary | ICD-10-CM | POA: Diagnosis not present

## 2014-02-28 DIAGNOSIS — Z91018 Allergy to other foods: Secondary | ICD-10-CM | POA: Diagnosis not present

## 2014-02-28 DIAGNOSIS — R079 Chest pain, unspecified: Secondary | ICD-10-CM | POA: Diagnosis not present

## 2014-02-28 DIAGNOSIS — J439 Emphysema, unspecified: Secondary | ICD-10-CM | POA: Diagnosis not present

## 2014-02-28 DIAGNOSIS — I2584 Coronary atherosclerosis due to calcified coronary lesion: Secondary | ICD-10-CM | POA: Diagnosis not present

## 2014-02-28 DIAGNOSIS — K219 Gastro-esophageal reflux disease without esophagitis: Secondary | ICD-10-CM | POA: Diagnosis not present

## 2014-02-28 DIAGNOSIS — I25110 Atherosclerotic heart disease of native coronary artery with unstable angina pectoris: Secondary | ICD-10-CM | POA: Diagnosis not present

## 2014-02-28 DIAGNOSIS — G4730 Sleep apnea, unspecified: Secondary | ICD-10-CM | POA: Diagnosis not present

## 2014-02-28 DIAGNOSIS — I25118 Atherosclerotic heart disease of native coronary artery with other forms of angina pectoris: Secondary | ICD-10-CM | POA: Diagnosis not present

## 2014-02-28 DIAGNOSIS — Z803 Family history of malignant neoplasm of breast: Secondary | ICD-10-CM | POA: Diagnosis not present

## 2014-02-28 DIAGNOSIS — E785 Hyperlipidemia, unspecified: Secondary | ICD-10-CM | POA: Diagnosis not present

## 2014-02-28 DIAGNOSIS — I259 Chronic ischemic heart disease, unspecified: Secondary | ICD-10-CM | POA: Diagnosis not present

## 2014-02-28 DIAGNOSIS — Z87891 Personal history of nicotine dependence: Secondary | ICD-10-CM | POA: Diagnosis not present

## 2014-02-28 DIAGNOSIS — I200 Unstable angina: Secondary | ICD-10-CM | POA: Diagnosis not present

## 2014-02-28 DIAGNOSIS — Z888 Allergy status to other drugs, medicaments and biological substances status: Secondary | ICD-10-CM | POA: Diagnosis not present

## 2014-02-28 DIAGNOSIS — Z79899 Other long term (current) drug therapy: Secondary | ICD-10-CM | POA: Diagnosis not present

## 2014-02-28 DIAGNOSIS — E119 Type 2 diabetes mellitus without complications: Secondary | ICD-10-CM | POA: Diagnosis not present

## 2014-02-28 DIAGNOSIS — G4733 Obstructive sleep apnea (adult) (pediatric): Secondary | ICD-10-CM | POA: Diagnosis not present

## 2014-03-01 DIAGNOSIS — I452 Bifascicular block: Secondary | ICD-10-CM | POA: Diagnosis not present

## 2014-03-01 DIAGNOSIS — N400 Enlarged prostate without lower urinary tract symptoms: Secondary | ICD-10-CM | POA: Diagnosis not present

## 2014-03-01 DIAGNOSIS — I25118 Atherosclerotic heart disease of native coronary artery with other forms of angina pectoris: Secondary | ICD-10-CM | POA: Diagnosis not present

## 2014-03-01 DIAGNOSIS — I259 Chronic ischemic heart disease, unspecified: Secondary | ICD-10-CM | POA: Diagnosis not present

## 2014-03-01 DIAGNOSIS — I10 Essential (primary) hypertension: Secondary | ICD-10-CM | POA: Diagnosis not present

## 2014-03-01 DIAGNOSIS — Z833 Family history of diabetes mellitus: Secondary | ICD-10-CM | POA: Diagnosis not present

## 2014-03-09 ENCOUNTER — Encounter: Payer: Self-pay | Admitting: Sports Medicine

## 2014-03-10 MED ORDER — PANTOPRAZOLE SODIUM 40 MG PO TBEC: 40.0000 mg | DELAYED_RELEASE_TABLET | Freq: Two times a day (BID) | ORAL | Status: DC

## 2014-03-18 ENCOUNTER — Other Ambulatory Visit: Payer: Self-pay

## 2014-03-20 DIAGNOSIS — I2582 Chronic total occlusion of coronary artery: Secondary | ICD-10-CM | POA: Diagnosis not present

## 2014-03-20 DIAGNOSIS — I4510 Unspecified right bundle-branch block: Secondary | ICD-10-CM | POA: Diagnosis not present

## 2014-03-20 DIAGNOSIS — I10 Essential (primary) hypertension: Secondary | ICD-10-CM | POA: Diagnosis not present

## 2014-03-20 DIAGNOSIS — Z955 Presence of coronary angioplasty implant and graft: Secondary | ICD-10-CM | POA: Diagnosis not present

## 2014-03-20 DIAGNOSIS — I2510 Atherosclerotic heart disease of native coronary artery without angina pectoris: Secondary | ICD-10-CM | POA: Diagnosis not present

## 2014-03-21 DIAGNOSIS — J479 Bronchiectasis, uncomplicated: Secondary | ICD-10-CM | POA: Diagnosis not present

## 2014-03-21 DIAGNOSIS — R918 Other nonspecific abnormal finding of lung field: Secondary | ICD-10-CM | POA: Diagnosis not present

## 2014-03-24 DIAGNOSIS — Z85828 Personal history of other malignant neoplasm of skin: Secondary | ICD-10-CM | POA: Diagnosis not present

## 2014-03-24 DIAGNOSIS — L218 Other seborrheic dermatitis: Secondary | ICD-10-CM | POA: Diagnosis not present

## 2014-03-24 DIAGNOSIS — L814 Other melanin hyperpigmentation: Secondary | ICD-10-CM | POA: Diagnosis not present

## 2014-03-24 DIAGNOSIS — L579 Skin changes due to chronic exposure to nonionizing radiation, unspecified: Secondary | ICD-10-CM | POA: Diagnosis not present

## 2014-03-24 DIAGNOSIS — L821 Other seborrheic keratosis: Secondary | ICD-10-CM | POA: Diagnosis not present

## 2014-03-30 ENCOUNTER — Other Ambulatory Visit: Payer: Self-pay | Admitting: Sports Medicine

## 2014-03-31 ENCOUNTER — Telehealth: Payer: Self-pay | Admitting: Sports Medicine

## 2014-03-31 NOTE — Telephone Encounter (Signed)
Yeah not a family member and Dr. Ileene Rubens is better at primary care than me anyways.  I am happy to consult on any neuromusculoskeletal issues.

## 2014-03-31 NOTE — Telephone Encounter (Signed)
Patient and his wife see you and have spoken VERY highly of you and they have a close friend that really wants to see you.  Would you be willing to see him as PCP?  I did mention dr hommel but he asked me to check and see if you would be willing to make an exception.  thanks

## 2014-04-01 NOTE — Telephone Encounter (Signed)
[name] [name] see Dr. Darene Lamer comment below. [name] [name],CMA

## 2014-04-10 DIAGNOSIS — Z87891 Personal history of nicotine dependence: Secondary | ICD-10-CM | POA: Diagnosis not present

## 2014-04-10 DIAGNOSIS — N400 Enlarged prostate without lower urinary tract symptoms: Secondary | ICD-10-CM | POA: Diagnosis not present

## 2014-04-10 DIAGNOSIS — E785 Hyperlipidemia, unspecified: Secondary | ICD-10-CM | POA: Diagnosis not present

## 2014-04-10 DIAGNOSIS — R079 Chest pain, unspecified: Secondary | ICD-10-CM | POA: Diagnosis not present

## 2014-04-10 DIAGNOSIS — Z79899 Other long term (current) drug therapy: Secondary | ICD-10-CM | POA: Diagnosis not present

## 2014-04-10 DIAGNOSIS — J449 Chronic obstructive pulmonary disease, unspecified: Secondary | ICD-10-CM | POA: Diagnosis not present

## 2014-04-10 DIAGNOSIS — Z91018 Allergy to other foods: Secondary | ICD-10-CM | POA: Diagnosis not present

## 2014-04-10 DIAGNOSIS — K219 Gastro-esophageal reflux disease without esophagitis: Secondary | ICD-10-CM | POA: Diagnosis not present

## 2014-04-10 DIAGNOSIS — Z888 Allergy status to other drugs, medicaments and biological substances status: Secondary | ICD-10-CM | POA: Diagnosis not present

## 2014-04-10 DIAGNOSIS — E119 Type 2 diabetes mellitus without complications: Secondary | ICD-10-CM | POA: Diagnosis not present

## 2014-04-10 DIAGNOSIS — Z885 Allergy status to narcotic agent status: Secondary | ICD-10-CM | POA: Diagnosis not present

## 2014-04-10 DIAGNOSIS — I10 Essential (primary) hypertension: Secondary | ICD-10-CM | POA: Diagnosis not present

## 2014-04-10 DIAGNOSIS — I447 Left bundle-branch block, unspecified: Secondary | ICD-10-CM | POA: Diagnosis not present

## 2014-04-10 DIAGNOSIS — R0789 Other chest pain: Secondary | ICD-10-CM | POA: Diagnosis not present

## 2014-04-10 DIAGNOSIS — I2510 Atherosclerotic heart disease of native coronary artery without angina pectoris: Secondary | ICD-10-CM | POA: Diagnosis not present

## 2014-04-10 DIAGNOSIS — R911 Solitary pulmonary nodule: Secondary | ICD-10-CM | POA: Diagnosis not present

## 2014-04-10 DIAGNOSIS — Z955 Presence of coronary angioplasty implant and graft: Secondary | ICD-10-CM | POA: Diagnosis not present

## 2014-04-10 DIAGNOSIS — Z7982 Long term (current) use of aspirin: Secondary | ICD-10-CM | POA: Diagnosis not present

## 2014-04-10 DIAGNOSIS — D649 Anemia, unspecified: Secondary | ICD-10-CM | POA: Diagnosis not present

## 2014-04-17 DIAGNOSIS — R0789 Other chest pain: Secondary | ICD-10-CM | POA: Diagnosis not present

## 2014-04-17 DIAGNOSIS — K219 Gastro-esophageal reflux disease without esophagitis: Secondary | ICD-10-CM | POA: Diagnosis not present

## 2014-05-01 DIAGNOSIS — Z48812 Encounter for surgical aftercare following surgery on the circulatory system: Secondary | ICD-10-CM | POA: Diagnosis not present

## 2014-05-01 DIAGNOSIS — Z955 Presence of coronary angioplasty implant and graft: Secondary | ICD-10-CM | POA: Diagnosis not present

## 2014-05-05 DIAGNOSIS — Z955 Presence of coronary angioplasty implant and graft: Secondary | ICD-10-CM | POA: Diagnosis not present

## 2014-05-05 DIAGNOSIS — Z48812 Encounter for surgical aftercare following surgery on the circulatory system: Secondary | ICD-10-CM | POA: Diagnosis not present

## 2014-05-06 DIAGNOSIS — Z955 Presence of coronary angioplasty implant and graft: Secondary | ICD-10-CM | POA: Diagnosis not present

## 2014-05-06 DIAGNOSIS — Z48812 Encounter for surgical aftercare following surgery on the circulatory system: Secondary | ICD-10-CM | POA: Diagnosis not present

## 2014-05-08 DIAGNOSIS — Z48812 Encounter for surgical aftercare following surgery on the circulatory system: Secondary | ICD-10-CM | POA: Diagnosis not present

## 2014-05-08 DIAGNOSIS — Z955 Presence of coronary angioplasty implant and graft: Secondary | ICD-10-CM | POA: Diagnosis not present

## 2014-05-12 DIAGNOSIS — Z955 Presence of coronary angioplasty implant and graft: Secondary | ICD-10-CM | POA: Diagnosis not present

## 2014-05-12 DIAGNOSIS — Z48812 Encounter for surgical aftercare following surgery on the circulatory system: Secondary | ICD-10-CM | POA: Diagnosis not present

## 2014-05-15 DIAGNOSIS — Z955 Presence of coronary angioplasty implant and graft: Secondary | ICD-10-CM | POA: Diagnosis not present

## 2014-05-15 DIAGNOSIS — Z48812 Encounter for surgical aftercare following surgery on the circulatory system: Secondary | ICD-10-CM | POA: Diagnosis not present

## 2014-05-15 DIAGNOSIS — G4733 Obstructive sleep apnea (adult) (pediatric): Secondary | ICD-10-CM | POA: Diagnosis not present

## 2014-05-16 DIAGNOSIS — I10 Essential (primary) hypertension: Secondary | ICD-10-CM | POA: Diagnosis not present

## 2014-05-16 DIAGNOSIS — G4733 Obstructive sleep apnea (adult) (pediatric): Secondary | ICD-10-CM | POA: Diagnosis not present

## 2014-05-16 DIAGNOSIS — I2582 Chronic total occlusion of coronary artery: Secondary | ICD-10-CM | POA: Diagnosis not present

## 2014-05-16 DIAGNOSIS — Z955 Presence of coronary angioplasty implant and graft: Secondary | ICD-10-CM | POA: Insufficient documentation

## 2014-05-16 DIAGNOSIS — R918 Other nonspecific abnormal finding of lung field: Secondary | ICD-10-CM | POA: Diagnosis not present

## 2014-05-16 DIAGNOSIS — I200 Unstable angina: Secondary | ICD-10-CM | POA: Diagnosis not present

## 2014-05-19 DIAGNOSIS — Z48812 Encounter for surgical aftercare following surgery on the circulatory system: Secondary | ICD-10-CM | POA: Diagnosis not present

## 2014-05-19 DIAGNOSIS — Z955 Presence of coronary angioplasty implant and graft: Secondary | ICD-10-CM | POA: Diagnosis not present

## 2014-05-20 DIAGNOSIS — Z48812 Encounter for surgical aftercare following surgery on the circulatory system: Secondary | ICD-10-CM | POA: Diagnosis not present

## 2014-05-20 DIAGNOSIS — Z955 Presence of coronary angioplasty implant and graft: Secondary | ICD-10-CM | POA: Diagnosis not present

## 2014-05-22 DIAGNOSIS — Z48812 Encounter for surgical aftercare following surgery on the circulatory system: Secondary | ICD-10-CM | POA: Diagnosis not present

## 2014-05-22 DIAGNOSIS — Z955 Presence of coronary angioplasty implant and graft: Secondary | ICD-10-CM | POA: Diagnosis not present

## 2014-05-25 ENCOUNTER — Other Ambulatory Visit: Payer: Self-pay | Admitting: Sports Medicine

## 2014-05-26 DIAGNOSIS — Z955 Presence of coronary angioplasty implant and graft: Secondary | ICD-10-CM | POA: Diagnosis not present

## 2014-05-26 DIAGNOSIS — Z48812 Encounter for surgical aftercare following surgery on the circulatory system: Secondary | ICD-10-CM | POA: Diagnosis not present

## 2014-05-27 DIAGNOSIS — Z48812 Encounter for surgical aftercare following surgery on the circulatory system: Secondary | ICD-10-CM | POA: Diagnosis not present

## 2014-05-27 DIAGNOSIS — Z955 Presence of coronary angioplasty implant and graft: Secondary | ICD-10-CM | POA: Diagnosis not present

## 2014-05-29 DIAGNOSIS — Z955 Presence of coronary angioplasty implant and graft: Secondary | ICD-10-CM | POA: Diagnosis not present

## 2014-05-29 DIAGNOSIS — Z48812 Encounter for surgical aftercare following surgery on the circulatory system: Secondary | ICD-10-CM | POA: Diagnosis not present

## 2014-05-30 ENCOUNTER — Other Ambulatory Visit: Payer: Self-pay | Admitting: Sports Medicine

## 2014-06-02 DIAGNOSIS — Z955 Presence of coronary angioplasty implant and graft: Secondary | ICD-10-CM | POA: Diagnosis not present

## 2014-06-02 DIAGNOSIS — Z48812 Encounter for surgical aftercare following surgery on the circulatory system: Secondary | ICD-10-CM | POA: Diagnosis not present

## 2014-06-03 DIAGNOSIS — Z955 Presence of coronary angioplasty implant and graft: Secondary | ICD-10-CM | POA: Diagnosis not present

## 2014-06-03 DIAGNOSIS — Z48812 Encounter for surgical aftercare following surgery on the circulatory system: Secondary | ICD-10-CM | POA: Diagnosis not present

## 2014-06-05 DIAGNOSIS — Z955 Presence of coronary angioplasty implant and graft: Secondary | ICD-10-CM | POA: Diagnosis not present

## 2014-06-05 DIAGNOSIS — Z48812 Encounter for surgical aftercare following surgery on the circulatory system: Secondary | ICD-10-CM | POA: Diagnosis not present

## 2014-06-09 DIAGNOSIS — Z48812 Encounter for surgical aftercare following surgery on the circulatory system: Secondary | ICD-10-CM | POA: Diagnosis not present

## 2014-06-09 DIAGNOSIS — Z955 Presence of coronary angioplasty implant and graft: Secondary | ICD-10-CM | POA: Diagnosis not present

## 2014-06-10 DIAGNOSIS — Z955 Presence of coronary angioplasty implant and graft: Secondary | ICD-10-CM | POA: Diagnosis not present

## 2014-06-10 DIAGNOSIS — Z48812 Encounter for surgical aftercare following surgery on the circulatory system: Secondary | ICD-10-CM | POA: Diagnosis not present

## 2014-06-12 DIAGNOSIS — Z955 Presence of coronary angioplasty implant and graft: Secondary | ICD-10-CM | POA: Diagnosis not present

## 2014-06-12 DIAGNOSIS — Z48812 Encounter for surgical aftercare following surgery on the circulatory system: Secondary | ICD-10-CM | POA: Diagnosis not present

## 2014-06-16 DIAGNOSIS — Z48812 Encounter for surgical aftercare following surgery on the circulatory system: Secondary | ICD-10-CM | POA: Diagnosis not present

## 2014-06-16 DIAGNOSIS — Z955 Presence of coronary angioplasty implant and graft: Secondary | ICD-10-CM | POA: Diagnosis not present

## 2014-06-17 DIAGNOSIS — Z955 Presence of coronary angioplasty implant and graft: Secondary | ICD-10-CM | POA: Diagnosis not present

## 2014-06-17 DIAGNOSIS — Z48812 Encounter for surgical aftercare following surgery on the circulatory system: Secondary | ICD-10-CM | POA: Diagnosis not present

## 2014-06-18 ENCOUNTER — Ambulatory Visit (INDEPENDENT_AMBULATORY_CARE_PROVIDER_SITE_OTHER): Payer: Medicare Other | Admitting: Sports Medicine

## 2014-06-18 ENCOUNTER — Ambulatory Visit: Payer: Self-pay | Admitting: Sports Medicine

## 2014-06-18 ENCOUNTER — Ambulatory Visit (INDEPENDENT_AMBULATORY_CARE_PROVIDER_SITE_OTHER): Payer: Medicare Other

## 2014-06-18 ENCOUNTER — Encounter: Payer: Self-pay | Admitting: Sports Medicine

## 2014-06-18 VITALS — BP 112/67 | HR 83 | Temp 97.8°F | Resp 18 | Wt 200.0 lb

## 2014-06-18 DIAGNOSIS — R769 Abnormal immunological finding in serum, unspecified: Secondary | ICD-10-CM

## 2014-06-18 DIAGNOSIS — M7542 Impingement syndrome of left shoulder: Secondary | ICD-10-CM

## 2014-06-18 DIAGNOSIS — M25512 Pain in left shoulder: Secondary | ICD-10-CM | POA: Diagnosis not present

## 2014-06-18 DIAGNOSIS — M898X1 Other specified disorders of bone, shoulder: Secondary | ICD-10-CM | POA: Diagnosis not present

## 2014-06-18 DIAGNOSIS — R778 Other specified abnormalities of plasma proteins: Secondary | ICD-10-CM

## 2014-06-18 DIAGNOSIS — E038 Other specified hypothyroidism: Secondary | ICD-10-CM

## 2014-06-18 DIAGNOSIS — M19012 Primary osteoarthritis, left shoulder: Secondary | ICD-10-CM | POA: Diagnosis not present

## 2014-06-18 DIAGNOSIS — I2510 Atherosclerotic heart disease of native coronary artery without angina pectoris: Secondary | ICD-10-CM | POA: Diagnosis not present

## 2014-06-18 DIAGNOSIS — E0800 Diabetes mellitus due to underlying condition with hyperosmolarity without nonketotic hyperglycemic-hyperosmolar coma (NKHHC): Secondary | ICD-10-CM | POA: Diagnosis not present

## 2014-06-18 DIAGNOSIS — I2583 Coronary atherosclerosis due to lipid rich plaque: Secondary | ICD-10-CM

## 2014-06-18 NOTE — Addendum Note (Signed)
Addended by: Silverio Decamp on: 06/18/2014 01:59 PM   Modules accepted: Orders

## 2014-06-18 NOTE — Assessment & Plan Note (Signed)
Routine blood work.

## 2014-06-18 NOTE — Assessment & Plan Note (Addendum)
With pain at night, weakness, and a positive painful arc. Injection as above, formal physical therapy, x-rays, return to see me in one month.  Although this clinically represents impingement syndrome, x-ray does show multiple lucencies concerning for multiple myeloma or other metastatic process. Obtaining serum and urine protein electrophoresis as well as a full body PET scan.

## 2014-06-18 NOTE — Progress Notes (Signed)
  Subjective:    CC:  Follow-up  HPI: Coronary artery disease: He is recently post stenting. Doing well. Does have follow-ups with cardiology. Currently doing cardiac rehabilitation.  Preventive measures: Is not due for a physical exam until November of this year.   Left shoulder pain: Moderate, persistent, present for a few months, localized of the deltoid and worse with overhead activities, no trauma  Past medical history, Surgical history, Family history not pertinant except as noted below, Social history, Allergies, and medications have been entered into the medical record, reviewed, and no changes needed.   Review of Systems: No fevers, chills, night sweats, weight loss, chest pain, or shortness of breath.   Objective:    General: Well Developed, well nourished, and in no acute distress.  Neuro: Alert and oriented x3, extra-ocular muscles intact, sensation grossly intact.  HEENT: Normocephalic, atraumatic, pupils equal round reactive to light, neck supple, no masses, no lymphadenopathy, thyroid nonpalpable.  Skin: Warm and dry, no rashes. Cardiac: Regular rate and rhythm, no murmurs rubs or gallops, no lower extremity edema.  Respiratory: Clear to auscultation bilaterally. Not using accessory muscles, speaking in full sentences. Left Shoulder: Inspection reveals no abnormalities, atrophy or asymmetry. Palpation is normal with no tenderness over AC joint or bicipital groove. ROM is full in all planes. Rotator cuff strength normal throughout. positive Neer and Hawkin's tests, empty can. Speeds and Yergason's tests normal. No labral pathology noted with negative Obrien's, negative crank, negative clunk, and good stability. Normal scapular function observed. No painful arc and no drop arm sign. No apprehension sign  Procedure: Real-time Ultrasound Guided Injection of left subacromial bursa Device: GE Logiq E  Verbal informed consent obtained.  Time-out conducted.  Noted no  overlying erythema, induration, or other signs of local infection.  Skin prepped in a sterile fashion.  Local anesthesia: Topical Ethyl chloride.  With sterile technique and under real time ultrasound guidance:  Noted an intact rotator cuff, 1 mL kenalog 40, 4 mL lidocaine injected easily into the subacromial bursa. Completed without difficulty  Pain immediately resolved suggesting accurate placement of the medication.  Advised to call if fevers/chills, erythema, induration, drainage, or persistent bleeding.  Images permanently stored and available for review in the ultrasound unit.  Impression: Technically successful ultrasound guided injection.  Impression and Recommendations:

## 2014-06-19 ENCOUNTER — Encounter: Payer: Self-pay | Admitting: Sports Medicine

## 2014-06-19 DIAGNOSIS — I2510 Atherosclerotic heart disease of native coronary artery without angina pectoris: Secondary | ICD-10-CM | POA: Diagnosis not present

## 2014-06-19 DIAGNOSIS — E038 Other specified hypothyroidism: Secondary | ICD-10-CM | POA: Diagnosis not present

## 2014-06-19 DIAGNOSIS — Z48812 Encounter for surgical aftercare following surgery on the circulatory system: Secondary | ICD-10-CM | POA: Diagnosis not present

## 2014-06-19 DIAGNOSIS — I2583 Coronary atherosclerosis due to lipid rich plaque: Secondary | ICD-10-CM | POA: Diagnosis not present

## 2014-06-19 DIAGNOSIS — E0800 Diabetes mellitus due to underlying condition with hyperosmolarity without nonketotic hyperglycemic-hyperosmolar coma (NKHHC): Secondary | ICD-10-CM | POA: Diagnosis not present

## 2014-06-19 DIAGNOSIS — Z955 Presence of coronary angioplasty implant and graft: Secondary | ICD-10-CM | POA: Diagnosis not present

## 2014-06-19 LAB — COMPREHENSIVE METABOLIC PANEL
ALT: 19 U/L (ref 0–53)
AST: 15 U/L (ref 0–37)
BUN: 21 mg/dL (ref 6–23)
CO2: 25 mEq/L (ref 19–32)
Calcium: 9.6 mg/dL (ref 8.4–10.5)
Chloride: 101 mEq/L (ref 96–112)
Potassium: 4.3 mEq/L (ref 3.5–5.3)
Sodium: 139 mEq/L (ref 135–145)
Total Bilirubin: 0.4 mg/dL (ref 0.2–1.2)

## 2014-06-19 LAB — LIPID PANEL
Cholesterol: 150 mg/dL (ref 0–200)
HDL: 39 mg/dL — ABNORMAL LOW (ref >=40)
LDL Cholesterol: 82 mg/dL (ref 0–99)
Total CHOL/HDL Ratio: 3.8 ratio
Triglycerides: 143 mg/dL (ref <150)
VLDL: 29 mg/dL (ref 0–40)

## 2014-06-19 LAB — HEMOGLOBIN A1C
Hgb A1c MFr Bld: 6 % — ABNORMAL HIGH (ref <5.7)
Mean Plasma Glucose: 126 mg/dL — ABNORMAL HIGH (ref <117)

## 2014-06-19 LAB — COMPREHENSIVE METABOLIC PANEL WITH GFR
Albumin: 3.9 g/dL (ref 3.5–5.2)
Alkaline Phosphatase: 88 U/L (ref 39–117)
Creat: 1.18 mg/dL (ref 0.50–1.35)
Glucose, Bld: 110 mg/dL — ABNORMAL HIGH (ref 70–99)
Total Protein: 7.9 g/dL (ref 6.0–8.3)

## 2014-06-19 LAB — TSH: TSH: 0.673 u[IU]/mL (ref 0.350–4.500)

## 2014-06-20 LAB — CBC
HCT: 34.4 % — ABNORMAL LOW (ref 39.0–52.0)
Hemoglobin: 11.2 g/dL — ABNORMAL LOW (ref 13.0–17.0)
MCH: 28.6 pg (ref 26.0–34.0)
MCHC: 32.6 g/dL (ref 30.0–36.0)
MCV: 88 fL (ref 78.0–100.0)
MPV: 9 fL (ref 8.6–12.4)
Platelets: 290 K/uL (ref 150–400)
RBC: 3.91 MIL/uL — ABNORMAL LOW (ref 4.22–5.81)
RDW: 16.1 % — ABNORMAL HIGH (ref 11.5–15.5)
WBC: 7.5 K/uL (ref 4.0–10.5)

## 2014-06-23 DIAGNOSIS — M7542 Impingement syndrome of left shoulder: Secondary | ICD-10-CM | POA: Diagnosis not present

## 2014-06-23 DIAGNOSIS — Z955 Presence of coronary angioplasty implant and graft: Secondary | ICD-10-CM | POA: Diagnosis not present

## 2014-06-23 DIAGNOSIS — Z48812 Encounter for surgical aftercare following surgery on the circulatory system: Secondary | ICD-10-CM | POA: Diagnosis not present

## 2014-06-24 DIAGNOSIS — Z48812 Encounter for surgical aftercare following surgery on the circulatory system: Secondary | ICD-10-CM | POA: Diagnosis not present

## 2014-06-24 DIAGNOSIS — Z955 Presence of coronary angioplasty implant and graft: Secondary | ICD-10-CM | POA: Diagnosis not present

## 2014-06-25 ENCOUNTER — Ambulatory Visit (INDEPENDENT_AMBULATORY_CARE_PROVIDER_SITE_OTHER): Payer: Medicare Other | Admitting: Physical Therapy

## 2014-06-25 ENCOUNTER — Encounter: Payer: Self-pay | Admitting: Physical Therapy

## 2014-06-25 DIAGNOSIS — R293 Abnormal posture: Secondary | ICD-10-CM | POA: Diagnosis not present

## 2014-06-25 DIAGNOSIS — R29898 Other symptoms and signs involving the musculoskeletal system: Secondary | ICD-10-CM | POA: Diagnosis not present

## 2014-06-25 DIAGNOSIS — M25512 Pain in left shoulder: Secondary | ICD-10-CM | POA: Diagnosis not present

## 2014-06-25 LAB — PROTEIN ELECTROPHORESIS, SERUM, WITH REFLEX
Abnormal Protein Band1: 1.6 g/dL
Albumin ELP: 3.6 g/dL — ABNORMAL LOW (ref 3.8–4.8)
Alpha-1-Globulin: 0.4 g/dL — ABNORMAL HIGH (ref 0.2–0.3)
Alpha-2-Globulin: 0.8 g/dL (ref 0.5–0.9)
Beta 2: 0.2 g/dL (ref 0.2–0.5)
Beta Globulin: 2.2 g/dL — ABNORMAL HIGH (ref 0.4–0.6)
Gamma Globulin: 0.2 g/dL — ABNORMAL LOW (ref 0.8–1.7)
Total Protein, Serum Electrophoresis: 7.3 g/dL (ref 6.1–8.1)

## 2014-06-25 LAB — IGG, IGA, IGM
IgA: 12 mg/dL — ABNORMAL LOW (ref 68–379)
IgG (Immunoglobin G), Serum: 1940 mg/dL — ABNORMAL HIGH (ref 650–1600)
IgM, Serum: 11 mg/dL — ABNORMAL LOW (ref 41–251)

## 2014-06-25 NOTE — Patient Instructions (Signed)
Strengthening: Resisted Internal Rotation   [place] [id number]   Hold tubing in left hand, elbow at side and forearm out. Rotate forearm in across body. Repeat _10___ times per set. Do _3___ sets per session. Do _1___ sessions per day.  [email or web address]  Strengthening: Resisted External Rotation - use a towel under the arm   Hold tubing in right hand, elbow at side and forearm across body. Rotate forearm out. Hold towel under elbow. Repeat _10___ times per set. Do __3__ sets per session. Do __1__ sessions per day.  [email or web address]  Strengthening: Resisted Flexion   Hold tubing with left arm at side. Pull forward and up. Move shoulder through pain-free range of motion. Repeat _10___ times per set. Do __3__ sets per session. Do __1__ sessions per day.  [email or web address]  Strengthening: Resisted Extension   Hold tubing in right hand, arm forward. Pull arm back, elbow straight, pulling shoulder blade back Repeat __10  times per set. Do _3___ sets per session. Do _1___ sessions per day.  [email or web address].[email or web address]   Copyright  VHI. All rights reserved.

## 2014-06-25 NOTE — Therapy (Signed)
[place], [place], [address] Phone: [phone]   Fax:  [phone]  Physical Therapy Evaluation  Patient Details  Name: [name] [name] MRN: [id number] Date of Birth: [birth year] Referring Provider:  Silverio Decamp,*  Encounter Date: 06/25/2014      PT End of Session - 06/25/14 0709    Visit Number 1   Number of Visits 8   Date for PT Re-Evaluation 07/23/14   PT Start Time 0712   PT Stop Time 0757   PT Time Calculation (min) 45 min   Activity Tolerance Other (comment)  pt's pain went up to 5/10 with exercise, he will ice at home      Past Medical History  Diagnosis Date  . Myocardial infarction 2010  . Complication of anesthesia     severe case of hiccups after hernia surgery;required medication  . Hyperlipidemia     takes Zocor daily  . Hypertension     takes HCTZ,Diovan daily and Isosorbide daily  . Asthma     uses Advair bid and asmanex prn  . Emphysema   . Pneumonia     hx of---12+yrs ago  . Chronic bronchitis     couple of yrs ago  . Dizziness     related to neck issues  . Arthritis     back  . Joint pain   . Chronic back pain     buldging disc and scoliosis  . Neck pain   . Itching     down left arm and leg  . GERD (gastroesophageal reflux disease)     takes PRotonix daily  . History of colon polyps   . Diverticulosis   . Enlarged prostate     takes Flomax daily  . Macular degeneration     mild,beginning  . Pain in limb 06/20/2012    Past Surgical History  Procedure Laterality Date  . Hernia repair  2006    x 3   . Salivary gland removed      left   . Vein removed from left leg    . Colonoscopy    . Skin cancer removed from left side of face      basal cell carcinoma  . Cardiac catheterization  2010/2013    06/07/11 [facility]) LAD 25%, D2 75% (small), pLCx 100%, RCA 25%--medical managment   . Anterior cervical decomp/discectomy fusion  01/31/2012   Procedure: ANTERIOR CERVICAL DECOMPRESSION/DISCECTOMY FUSION 1 LEVEL;  Surgeon: Floyce Stakes, MD;  Location: [facility];  Service: Neurosurgery;  Laterality: N/A;  Cervical Five-Six  Anterior Cervical Decompression /Diskectomy /Fusion/Plate    There were no vitals filed for this visit.  Visit Diagnosis:  Weakness of shoulder - Plan: PT plan of care cert/re-cert  Pain in joint, shoulder region, left - Plan: PT plan of care cert/re-cert  Abnormal posture - Plan: PT plan of care cert/re-cert      Subjective Assessment - 06/25/14 0717    Subjective Pt reports he developed Lt shoulder pain about 2 months ago however it became worse over the last 2 weeks.  He had an injection last week and this has helped alot.    Patient Stated Goals get rid of pain, return to Lt shoulder stretching and weights at cardiac rehab   Currently in Pain? Yes   Pain Score 2    Pain Location Shoulder   Pain Orientation Left   Pain Descriptors / Indicators Numbness   Pain Radiating Towards into Lt  hand   Pain Onset More than a month ago   Pain Frequency Constant   Aggravating Factors  with movement, lifting overhead, driving with arm out   Pain Relieving Factors injection,             [facility] PT Assessment - 06/25/14 0001    Assessment   Medical Diagnosis Lt shoulder impingement   Onset Date 04/25/14   Next MD Visit 07/10/14   Prior Therapy none   Precautions   Precautions None   Balance Screen   Has the patient fallen in the past 6 months Yes   How many times? 1  in January due to his heart condition   Has the patient had a decrease in activity level because of a fear of falling?  No   Is the patient reluctant to leave their home because of a fear of falling?  No   Prior Function   Level of Independence --  I with all ADLS   Vocation Retired   Observation/Other Assessments   Focus on Therapeutic Outcomes (FOTO)  33% limited   Posture/Postural Control   Posture/Postural Control Postural  limitations   Postural Limitations Rounded Shoulders;Forward head;Increased thoracic kyphosis   ROM / Strength   AROM / PROM / Strength AROM;PROM;Strength   AROM   AROM Assessment Site Cervical;Shoulder   Right/Left Shoulder --  bilat WNL   Cervical Extension decreased 50%   Cervical - Right Rotation 60   Cervical - Left Rotation 60   PROM   Overall PROM Comments tightness posterior caspsule Lt shoulder    Strength   Overall Strength Comments --  mid trap 4+/5 low traps 4-/5    Strength Assessment Site Shoulder;Elbow   Right/Left Shoulder Left  Rt WNL   Left Shoulder Flexion 5/5   Left Shoulder ABduction --  5-/5   Left Shoulder Internal Rotation 5/5   Left Shoulder External Rotation --  5-/5   Right/Left Elbow Left   Left Elbow Flexion 5/5   Left Elbow Extension 4+/5                   [facility] Adult PT Treatment/Exercise - 06/25/14 0001    Exercises   Exercises Shoulder   Shoulder Exercises: Standing   External Rotation Left;20 reps;Theraband   Theraband Level (Shoulder External Rotation) Level 2 (Red)   Internal Rotation Left;20 reps;Theraband   Theraband Level (Shoulder Internal Rotation) Level 2 (Red)   Flexion Theraband  30 reps   Theraband Level (Shoulder Flexion) Level 2 (Red)   Extension Theraband;Left  30 reps with scap retrac   Theraband Level (Shoulder Extension) Level 2 (Red)                PT Education - 06/25/14 0743    Education provided Yes   Education Details HEP   Person(s) Educated Patient   Methods Explanation;Demonstration;Handout   Comprehension Tactile cues required;Returned demonstration  for form             PT Long Term Goals - 06/25/14 0801    PT LONG TERM GOAL #1   Title I with advanced HEP   Time 4   Period Weeks   Status New   PT LONG TERM GOAL #2   Title demo painfree Lt shoulder ROM    Time 4   Period Weeks   Status New   PT LONG TERM GOAL #3   Title increase strength of upper and mid back =/>  4+/5 to support posture  Time 4   Period Weeks   Status New   PT LONG TERM GOAL #4   Title improve FOTO =/< CJ level 30%.    Time 4   Period Weeks   Status New               Plan - 06/25/14 1245    Clinical Impression Statement 74 y/o male presents with Lt shoulder pain, he reports a fall in the end of Jan/begin of Feb where he landed on the Lt shoulder. He responeded well to the injection last week however reports some pain is returning. MD concerned there may be other issues in the shoulder he has a PET scan scheduled for Firday.    Pt will benefit from skilled therapeutic intervention in order to improve on the following deficits Postural dysfunction;Pain;Impaired UE functional use;Decreased strength   Rehab Potential Good   PT Frequency 2x / week   PT Duration 4 weeks   PT Treatment/Interventions Therapeutic activities;Patient/family education;Therapeutic exercise;Cryotherapy   PT Next Visit Plan Patient would benefit from modalities however we will need to see the results of the PET scan to see if we can do these. If pain continues with [place] 4's decrease to isometrics   Consulted and Agree with Plan of Care Patient         Problem List Patient Active Problem List   Diagnosis Date Noted  . Impingement syndrome of left shoulder 06/18/2014  . Obstructive sleep apnea 12/27/2013  . Cerumen impaction 12/27/2013  . GERD (gastroesophageal reflux disease) 12/27/2013  . Coronary artery disease 05/10/2013  . Hemorrhoids 03/19/2013  . Hematuria 02/21/2013  . Degenerative disc disease, cervical 02/19/2013  . Essential hypertension, benign 02/19/2013  . History of pulmonary embolism 02/19/2013  . Hyperlipidemia 02/19/2013  . Right bundle branch block 02/19/2013  . Asthma with COPD 02/19/2013  . Annual physical exam 02/19/2013  . Iron deficiency anemia 02/19/2013  . Pain in limb 06/20/2012  . Disturbance of skin sensation 06/20/2012    Jeral Pinch, PT 06/25/2014,  8:07 AM  [facility] [place], [place], [address] Phone: [phone]   Fax:  [phone]

## 2014-06-26 ENCOUNTER — Ambulatory Visit: Payer: Medicare Other | Admitting: Sports Medicine

## 2014-06-26 DIAGNOSIS — R778 Other specified abnormalities of plasma proteins: Secondary | ICD-10-CM | POA: Insufficient documentation

## 2014-06-26 DIAGNOSIS — Z955 Presence of coronary angioplasty implant and graft: Secondary | ICD-10-CM | POA: Diagnosis not present

## 2014-06-26 DIAGNOSIS — Z48812 Encounter for surgical aftercare following surgery on the circulatory system: Secondary | ICD-10-CM | POA: Diagnosis not present

## 2014-06-26 LAB — IFE INTERPRETATION

## 2014-06-26 NOTE — Addendum Note (Signed)
Addended by: Silverio Decamp on: 06/26/2014 12:57 PM   Modules accepted: Orders

## 2014-06-26 NOTE — Assessment & Plan Note (Signed)
With evidence of what appears to be metastatic disease in the humerus, abnormal SPEP and immunoglobulins, UPEP pending. Referral to hematology/oncology.

## 2014-06-27 ENCOUNTER — Encounter (HOSPITAL_COMMUNITY): Payer: Self-pay

## 2014-06-27 ENCOUNTER — Ambulatory Visit (HOSPITAL_COMMUNITY)
Admission: RE | Admit: 2014-06-27 | Discharge: 2014-06-27 | Disposition: A | Discharge status: Home / Self Care | Payer: Medicare Other | Source: Ambulatory Visit | Attending: Sports Medicine | Admitting: Sports Medicine

## 2014-06-27 DIAGNOSIS — M7540 Impingement syndrome of unspecified shoulder: Secondary | ICD-10-CM | POA: Insufficient documentation

## 2014-06-27 DIAGNOSIS — M7542 Impingement syndrome of left shoulder: Secondary | ICD-10-CM

## 2014-06-27 DIAGNOSIS — J9809 Other diseases of bronchus, not elsewhere classified: Secondary | ICD-10-CM | POA: Diagnosis not present

## 2014-06-27 LAB — PROTEIN ELECTROPHORESIS, URINE REFLEX
Albumin: 6.4 %
Alpha-1-Globulin, U: 23.3 %
Alpha-2-Globulin, U: 8.9 %
Beta Globulin, U: 59.9 %
Gamma Globulin, U: 1.5 %
Monoclonal Band 1: 41.4 %
Total Protein, Urine/Day: 799 mg/d — ABNORMAL HIGH (ref 50–100)
Total Protein, Urine: 47 mg/dL

## 2014-06-27 LAB — IMMUNOFIXATION INTE

## 2014-06-27 LAB — GLUCOSE, CAPILLARY: Glucose-Capillary: 101 mg/dL — ABNORMAL HIGH (ref 65–99)

## 2014-06-27 MED ORDER — FLUDEOXYGLUCOSE F - 18 (FDG) INJECTION
10.0100 | Freq: Once | INTRAVENOUS | Status: AC | PRN
  Administered 2014-06-27: 10.01 via INTRAVENOUS

## 2014-06-30 DIAGNOSIS — Z955 Presence of coronary angioplasty implant and graft: Secondary | ICD-10-CM | POA: Diagnosis not present

## 2014-06-30 DIAGNOSIS — Z48812 Encounter for surgical aftercare following surgery on the circulatory system: Secondary | ICD-10-CM | POA: Diagnosis not present

## 2014-07-01 ENCOUNTER — Encounter: Payer: Self-pay | Admitting: Sports Medicine

## 2014-07-01 DIAGNOSIS — Z48812 Encounter for surgical aftercare following surgery on the circulatory system: Secondary | ICD-10-CM | POA: Diagnosis not present

## 2014-07-01 DIAGNOSIS — Z955 Presence of coronary angioplasty implant and graft: Secondary | ICD-10-CM | POA: Diagnosis not present

## 2014-07-02 ENCOUNTER — Telehealth: Payer: Self-pay | Admitting: Sports Medicine

## 2014-07-02 ENCOUNTER — Ambulatory Visit (INDEPENDENT_AMBULATORY_CARE_PROVIDER_SITE_OTHER): Payer: Medicare Other | Admitting: Physical Therapy

## 2014-07-02 DIAGNOSIS — R293 Abnormal posture: Secondary | ICD-10-CM | POA: Diagnosis not present

## 2014-07-02 DIAGNOSIS — R29898 Other symptoms and signs involving the musculoskeletal system: Secondary | ICD-10-CM | POA: Diagnosis not present

## 2014-07-02 DIAGNOSIS — M25512 Pain in left shoulder: Secondary | ICD-10-CM | POA: Diagnosis not present

## 2014-07-02 NOTE — Telephone Encounter (Signed)
Set with patient in the office and discussed findings on humeral x-rays, PET scan, and serum and urine protein electrophoresis as well as possibility of multiple myeloma as a diagnosis. Referral has been placed to hematology and oncology. I will follow along.

## 2014-07-02 NOTE — Therapy (Addendum)
[facility] [place] [address] [place], [place], [address] Phone: [phone]   Fax:  [phone]  Physical Therapy Treatment  Patient Details  Name: [name] [name] MRN: [id number] Date of Birth: 12-21-1940 Referring Provider:  Monica Becton,*  Encounter Date: 07/02/2014      PT End of Session - 07/02/14 0803    Visit Number 2   Number of Visits 8   Date for PT Re-Evaluation 07/23/14   PT Start Time 0801   PT Stop Time 0845   PT Time Calculation (min) 44 min   Activity Tolerance Patient tolerated treatment well   Behavior During Therapy [facility] for tasks assessed/performed      Past Medical History  Diagnosis Date  . Myocardial infarction 2010  . Complication of anesthesia     severe case of hiccups after hernia surgery;required medication  . Hyperlipidemia     takes Zocor daily  . Hypertension     takes HCTZ,Diovan daily and Isosorbide daily  . Asthma     uses Advair bid and asmanex prn  . Emphysema   . Pneumonia     hx of---12+yrs ago  . Chronic bronchitis     couple of yrs ago  . Dizziness     related to neck issues  . Arthritis     back  . Joint pain   . Chronic back pain     buldging disc and scoliosis  . Neck pain   . Itching     down left arm and leg  . GERD (gastroesophageal reflux disease)     takes PRotonix daily  . History of colon polyps   . Diverticulosis   . Enlarged prostate     takes Flomax daily  . Macular degeneration     mild,beginning  . Pain in limb 06/20/2012    Past Surgical History  Procedure Laterality Date  . Hernia repair  2006    x 3   . Salivary gland removed      left   . Vein removed from left leg    . Colonoscopy    . Skin cancer removed from left side of face      basal cell carcinoma  . Cardiac catheterization  2010/2013    06/07/11 [facility]) LAD 25%, D2 75% (small), pLCx 100%, RCA 25%--medical managment   . Anterior cervical decomp/discectomy fusion   01/31/2012    Procedure: ANTERIOR CERVICAL DECOMPRESSION/DISCECTOMY FUSION 1 LEVEL;  Surgeon: Karn Cassis, MD;  Location: [facility];  Service: Neurosurgery;  Laterality: N/A;  Cervical Five-Six  Anterior Cervical Decompression /Diskectomy /Fusion/Plate    There were no vitals filed for this visit.  Visit Diagnosis:  Weakness of shoulder  Pain in joint, shoulder region, left  Abnormal posture      Subjective Assessment - 07/02/14 0803    Subjective Patient had to ice first day after exercises but since pain has calmed down.    Pain Score 2    Pain Location Shoulder   Pain Orientation Left   Pain Descriptors / Indicators Sore                         [facility] Adult PT Treatment/Exercise - 07/02/14 0001    Exercises   Exercises Neck   Shoulder Exercises: Supine   Other Supine Exercises foam roller - T stretch, y stretch, snow angel x 10   Shoulder Exercises: Standing   Horizontal ABduction Strengthening;Both;20 reps  green   External Rotation Left;Theraband  20 reps red painful; yellow x 5 given for Hep   Theraband Level (Shoulder External Rotation) Level 1 (Yellow);Level 2 (Red)   Internal Rotation Left;Theraband  30 reps   Theraband Level (Shoulder Internal Rotation) Level 2 (Red)   Flexion Strengthening;Left;Theraband  30 reps   Theraband Level (Shoulder Flexion) Level 2 (Red)   Extension Left;Theraband   Theraband Level (Shoulder Extension) Level 2 (Red)   Other Standing Exercises PNF D1/D2 flex/ext x 20 reps red tband   Shoulder Exercises: ROM/Strengthening   UBE (Upper Arm Bike) --  L2 51min fwd/89min bwd   Neck Exercises: Stretches   Upper Trapezius Stretch 1 rep;30 seconds  bil   Levator Stretch 1 rep;30 seconds  bil                PT Education - 07/02/14 0840    Education provided Yes   Education Details neck and corner stretches   Person(s) Educated Patient   Methods Explanation;Demonstration;Handout   Comprehension  Verbalized understanding;Returned demonstration             PT Long Term Goals - 06/25/14 0801    PT LONG TERM GOAL #1   Title I with advanced HEP   Time 4   Period Weeks   Status New   PT LONG TERM GOAL #2   Title demo painfree Lt shoulder ROM    Time 4   Period Weeks   Status New   PT LONG TERM GOAL #3   Title increase strength of upper and mid back =/> 4+/5 to support posture   Time 4   Period Weeks   Status New   PT LONG TERM GOAL #4   Title improve FOTO =/< CJ level 30%.    Time 4   Period Weeks   Status New               Plan - 07/02/14 1003    Clinical Impression Statement Patient tolerated therex well with some pain with ER exercises (issued yellow band for HEP ER). Patient demos rounded shoulders and tight pectorals. He is compliant with initial HEP. No goals met as only second visit.   PT Next Visit Plan Hold modalities until result of PET scan. Continue with shoulder strengthening and postual ed.   Consulted and Agree with Plan of Care Patient        Problem List Patient Active Problem List   Diagnosis Date Noted  . Abnormal SPEP 06/26/2014  . Impingement syndrome of left shoulder 06/18/2014  . Obstructive sleep apnea 12/27/2013  . Cerumen impaction 12/27/2013  . GERD (gastroesophageal reflux disease) 12/27/2013  . Coronary artery disease 05/10/2013  . Hemorrhoids 03/19/2013  . Hematuria 02/21/2013  . Degenerative disc disease, cervical 02/19/2013  . Essential hypertension, benign 02/19/2013  . History of pulmonary embolism 02/19/2013  . Hyperlipidemia 02/19/2013  . Right bundle branch block 02/19/2013  . Asthma with COPD 02/19/2013  . Annual physical exam 02/19/2013  . Iron deficiency anemia 02/19/2013  . Pain in limb 06/20/2012  . Disturbance of skin sensation 06/20/2012    Madelyn Flavors PT  07/02/2014, 10:08 AM  [facility] [place], [place], [address] Phone:  [phone]   Fax:  [phone]    PHYSICAL THERAPY DISCHARGE SUMMARY  Visits from Start of Care: 2  Current functional level related to goals / functional outcomes: See above   Remaining deficits: unknown   Education /  Equipment: Initial HEP Plan:                                                    Patient goals were not met. Patient is being discharged due to a change in medical status. Patient being treated for cancer ?????   Jeral Pinch, PT 07/31/2014 5:54 PM

## 2014-07-02 NOTE — Patient Instructions (Signed)
  Flexibility: Upper Trapezius Stretch   Gently grasp right side of head while reaching behind back with other hand. Tilt head away until a gentle stretch is felt. Hold 30____ seconds. Repeat _3___ times per set. Do ____ sets per session. Do __2__ sessions per day.  http://orth.exer.us/340    Levator Stretch   Grasp seat or sit on hand on side to be stretched. Turn head toward other side and look down. Use hand on head to gently stretch neck in that position. Hold _30___ seconds. Repeat on other side. Repeat _3___ times. Do __2__ sessions per day.  Posture - Sitting   Sit upright, head facing forward. Try using a roll to support lower back. Keep shoulders relaxed, and avoid rounded back. Keep hips level with knees. Avoid crossing legs for long periods.   Flexibility: Warehouse manager or Doorway   Standing in corner with hands just above shoulder level or stand in a doorway. Lean forward until a comfortable stretch is felt across chest. Hold __30__ seconds. Repeat __3__ times per set. Do ____ sets per session. Do _2___ sessions per day.  http://orth.exer.us/342   Copyright  VHI. All rights reserved.   Madelyn Flavors, PT 07/02/2014 8:39 AM  [facility] [place], [place] [address]  [phone] (office) [phone] (fax)

## 2014-07-03 DIAGNOSIS — Z955 Presence of coronary angioplasty implant and graft: Secondary | ICD-10-CM | POA: Diagnosis not present

## 2014-07-03 DIAGNOSIS — Z48812 Encounter for surgical aftercare following surgery on the circulatory system: Secondary | ICD-10-CM | POA: Diagnosis not present

## 2014-07-04 ENCOUNTER — Encounter: Payer: PRIVATE HEALTH INSURANCE | Admitting: Physical Therapy

## 2014-07-07 DIAGNOSIS — Z48812 Encounter for surgical aftercare following surgery on the circulatory system: Secondary | ICD-10-CM | POA: Diagnosis not present

## 2014-07-07 DIAGNOSIS — Z955 Presence of coronary angioplasty implant and graft: Secondary | ICD-10-CM | POA: Diagnosis not present

## 2014-07-08 ENCOUNTER — Encounter: Payer: Self-pay | Admitting: Sports Medicine

## 2014-07-08 DIAGNOSIS — Z955 Presence of coronary angioplasty implant and graft: Secondary | ICD-10-CM | POA: Diagnosis not present

## 2014-07-08 DIAGNOSIS — Z48812 Encounter for surgical aftercare following surgery on the circulatory system: Secondary | ICD-10-CM | POA: Diagnosis not present

## 2014-07-09 ENCOUNTER — Encounter: Payer: PRIVATE HEALTH INSURANCE | Admitting: Physical Therapy

## 2014-07-10 DIAGNOSIS — Z955 Presence of coronary angioplasty implant and graft: Secondary | ICD-10-CM | POA: Diagnosis not present

## 2014-07-10 DIAGNOSIS — Z48812 Encounter for surgical aftercare following surgery on the circulatory system: Secondary | ICD-10-CM | POA: Diagnosis not present

## 2014-07-11 ENCOUNTER — Encounter: Payer: Self-pay | Admitting: Physical Therapy

## 2014-07-15 DIAGNOSIS — Z955 Presence of coronary angioplasty implant and graft: Secondary | ICD-10-CM | POA: Diagnosis not present

## 2014-07-15 DIAGNOSIS — Z48812 Encounter for surgical aftercare following surgery on the circulatory system: Secondary | ICD-10-CM | POA: Diagnosis not present

## 2014-07-16 ENCOUNTER — Encounter: Payer: Self-pay | Admitting: Sports Medicine

## 2014-07-16 ENCOUNTER — Ambulatory Visit (INDEPENDENT_AMBULATORY_CARE_PROVIDER_SITE_OTHER): Payer: Medicare Other | Admitting: Sports Medicine

## 2014-07-16 VITALS — BP 101/56 | HR 66 | Ht 72.0 in | Wt 200.0 lb

## 2014-07-16 DIAGNOSIS — I2510 Atherosclerotic heart disease of native coronary artery without angina pectoris: Secondary | ICD-10-CM

## 2014-07-16 DIAGNOSIS — R769 Abnormal immunological finding in serum, unspecified: Secondary | ICD-10-CM

## 2014-07-16 DIAGNOSIS — R778 Other specified abnormalities of plasma proteins: Secondary | ICD-10-CM

## 2014-07-16 DIAGNOSIS — M7542 Impingement syndrome of left shoulder: Secondary | ICD-10-CM

## 2014-07-16 NOTE — Assessment & Plan Note (Signed)
Unfortunately we did see some lucencies on the left upper humerus, subsequent serum protein electropheresis did show a monoclonal spike, PET scan was essentially negative. Appointment with oncology is coming up in about 10 days.

## 2014-07-16 NOTE — Progress Notes (Signed)
  Subjective:    CC: Follow-up  HPI: Left shoulder pain: Resolved with physical therapy and subacromial injection.  Abnormal SPEP: Awaiting oncology referral.  Past medical history, Surgical history, Family history not pertinant except as noted below, Social history, Allergies, and medications have been entered into the medical record, reviewed, and no changes needed.   Review of Systems: No fevers, chills, night sweats, weight loss, chest pain, or shortness of breath.   Objective:    General: Well Developed, well nourished, and in no acute distress.  Neuro: Alert and oriented x3, extra-ocular muscles intact, sensation grossly intact.  HEENT: Normocephalic, atraumatic, pupils equal round reactive to light, neck supple, no masses, no lymphadenopathy, thyroid nonpalpable.  Skin: Warm and dry, no rashes. Cardiac: Regular rate and rhythm, no murmurs rubs or gallops, no lower extremity edema.  Respiratory: Clear to auscultation bilaterally. Not using accessory muscles, speaking in full sentences. Left Shoulder: Inspection reveals no abnormalities, atrophy or asymmetry. Palpation is normal with no tenderness over AC joint or bicipital groove. ROM is full in all planes. Rotator cuff strength normal throughout. No signs of impingement with negative Neer and Hawkin's tests, empty can. Speeds and Yergason's tests normal. No labral pathology noted with negative Obrien's, negative crank, negative clunk, and good stability. Normal scapular function observed. No painful arc and no drop arm sign. No apprehension sign  Impression and Recommendations:    I spent 25 minutes with this patient, greater than 50% was face-to-face time counseling regarding the above diagnoses

## 2014-07-16 NOTE — Assessment & Plan Note (Signed)
Symptoms have completely resolved with physical therapy and ultrasound guided subacromial injection.

## 2014-07-17 DIAGNOSIS — Z48812 Encounter for surgical aftercare following surgery on the circulatory system: Secondary | ICD-10-CM | POA: Diagnosis not present

## 2014-07-17 DIAGNOSIS — Z955 Presence of coronary angioplasty implant and graft: Secondary | ICD-10-CM | POA: Diagnosis not present

## 2014-07-21 ENCOUNTER — Encounter: Payer: Self-pay | Admitting: Sports Medicine

## 2014-07-21 DIAGNOSIS — Z48812 Encounter for surgical aftercare following surgery on the circulatory system: Secondary | ICD-10-CM | POA: Diagnosis not present

## 2014-07-21 DIAGNOSIS — Z955 Presence of coronary angioplasty implant and graft: Secondary | ICD-10-CM | POA: Diagnosis not present

## 2014-07-21 MED ORDER — LORAZEPAM 1 MG PO TABS: 1.5000 mg | ORAL_TABLET | Freq: Every day | ORAL | Status: DC

## 2014-07-22 DIAGNOSIS — Z48812 Encounter for surgical aftercare following surgery on the circulatory system: Secondary | ICD-10-CM | POA: Diagnosis not present

## 2014-07-22 DIAGNOSIS — Z955 Presence of coronary angioplasty implant and graft: Secondary | ICD-10-CM | POA: Diagnosis not present

## 2014-07-24 ENCOUNTER — Telehealth: Payer: Self-pay | Admitting: Hematology & Oncology

## 2014-07-24 DIAGNOSIS — Z955 Presence of coronary angioplasty implant and graft: Secondary | ICD-10-CM | POA: Diagnosis not present

## 2014-07-24 DIAGNOSIS — Z48812 Encounter for surgical aftercare following surgery on the circulatory system: Secondary | ICD-10-CM | POA: Diagnosis not present

## 2014-07-24 NOTE — Telephone Encounter (Signed)
I spoke w NEW PATIENT today to remind them of their appointment with Dr. Ennever. Also, advised them to bring all medication bottles and insurance card information. ° °

## 2014-07-25 ENCOUNTER — Ambulatory Visit (HOSPITAL_BASED_OUTPATIENT_CLINIC_OR_DEPARTMENT_OTHER)
Admission: RE | Admit: 2014-07-25 | Discharge: 2014-07-25 | Disposition: A | Discharge status: Home / Self Care | Payer: Medicare Other | Source: Ambulatory Visit | Attending: Hematology & Oncology | Admitting: Hematology & Oncology

## 2014-07-25 ENCOUNTER — Ambulatory Visit (HOSPITAL_BASED_OUTPATIENT_CLINIC_OR_DEPARTMENT_OTHER): Payer: Medicare Other

## 2014-07-25 ENCOUNTER — Encounter: Payer: Self-pay | Admitting: Hematology & Oncology

## 2014-07-25 ENCOUNTER — Ambulatory Visit (HOSPITAL_BASED_OUTPATIENT_CLINIC_OR_DEPARTMENT_OTHER): Payer: Medicare Other | Admitting: Hematology & Oncology

## 2014-07-25 ENCOUNTER — Ambulatory Visit: Payer: Medicare Other

## 2014-07-25 VITALS — BP 140/69 | HR 55 | Temp 97.6°F | Resp 18 | Ht 72.0 in | Wt 205.0 lb

## 2014-07-25 DIAGNOSIS — D472 Monoclonal gammopathy: Secondary | ICD-10-CM

## 2014-07-25 DIAGNOSIS — C9000 Multiple myeloma not having achieved remission: Secondary | ICD-10-CM | POA: Insufficient documentation

## 2014-07-25 LAB — CBC WITH DIFFERENTIAL (CANCER CENTER ONLY)
BASO#: 0 10*3/uL (ref 0.0–0.2)
BASO%: 0.3 % (ref 0.0–2.0)
EOS ABS: 0.1 10*3/uL (ref 0.0–0.5)
EOS%: 1.6 % (ref 0.0–7.0)
HCT: 33.3 % — ABNORMAL LOW (ref 38.7–49.9)
HGB: 11 g/dL — ABNORMAL LOW (ref 13.0–17.1)
LYMPH#: 2 10*3/uL (ref 0.9–3.3)
LYMPH%: 31.5 % (ref 14.0–48.0)
MCH: 29.3 pg (ref 28.0–33.4)
MCHC: 33 g/dL (ref 32.0–35.9)
MCV: 89 fL (ref 82–98)
MONO#: 0.7 10*3/uL (ref 0.1–0.9)
MONO%: 10.8 % (ref 0.0–13.0)
NEUT%: 55.8 % (ref 40.0–80.0)
NEUTROS ABS: 3.5 10*3/uL (ref 1.5–6.5)
Platelets: 233 10*3/uL (ref 145–400)
RBC: 3.76 10*6/uL — ABNORMAL LOW (ref 4.20–5.70)
RDW: 15.1 % (ref 11.1–15.7)
WBC: 6.2 10*3/uL (ref 4.0–10.0)

## 2014-07-25 LAB — COMPREHENSIVE METABOLIC PANEL
ALBUMIN: 3.6 g/dL (ref 3.5–5.2)
ALT: 24 U/L (ref 0–53)
AST: 19 U/L (ref 0–37)
Alkaline Phosphatase: 97 U/L (ref 39–117)
BILIRUBIN TOTAL: 0.4 mg/dL (ref 0.2–1.2)
BUN: 16 mg/dL (ref 6–23)
CHLORIDE: 102 meq/L (ref 96–112)
CO2: 25 mEq/L (ref 19–32)
Calcium: 9.3 mg/dL (ref 8.4–10.5)
Creatinine, Ser: 1.13 mg/dL (ref 0.50–1.35)
Glucose, Bld: 109 mg/dL — ABNORMAL HIGH (ref 70–99)
Potassium: 3.4 mEq/L — ABNORMAL LOW (ref 3.5–5.3)
Sodium: 140 mEq/L (ref 135–145)
Total Protein: 7.4 g/dL (ref 6.0–8.3)

## 2014-07-25 LAB — CHCC SATELLITE - SMEAR

## 2014-07-25 NOTE — Progress Notes (Signed)
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 NEW PATIENT  : I have abnormal blood work.  HPI: [name] [name] is a really nice 74 year old white gentleman. He is retired. He was working in [employer]. He comes in with his wife. She is from [place]. They are planning to go back to [place] for a month to visit her family. She has several physicians in her family. However, the [place] virus is causing a lot of concern for him.  He was having some shoulder pain with the left shoulder. He ultimately had a x-ray done. This was done in early May. This showed some multiple subtle lucencies in the proximal left humerus.  He then had a PET scan done. I'm absolutely shocked had insurance would approve this. However, the PET scan was negative.  Lab work was then done. He was found to have a monoclonal spike of 1.6 g/dL. His IgG G level was 1960 mg/dL. He had a markedly suppressed IgA, and IgM levels.  A serum immunofixation showed that he had an IgG Kappa spike.  A 24-hour urine was done. This showed 800 mg of protein. There is no breakdown of the protein into light chains.  He was then referred to the [place] for evaluation.  He actually feels pretty good. He's had no issues with infections. He says he's had pneumonia a couple times the past 2 years. He had a blood clot in the right lung 2 years ago and was on blood thinner for 6 months.  He's had no weight loss or weight gain. He's had no change in bowel or bladder habits.  He does not smoke.  He's had no rashes. He's had no leg swelling.  Overall, his performance status is ECOG 0.   Past Medical History  Diagnosis Date  . Myocardial infarction 2010  . Complication of anesthesia     severe case of hiccups after hernia surgery;required medication  . Hyperlipidemia     takes Zocor daily  . Hypertension     takes HCTZ,Diovan daily and  Isosorbide daily  . Asthma     uses Advair bid and asmanex prn  . Emphysema   . Pneumonia     hx of---12+yrs ago  . Chronic bronchitis     couple of yrs ago  . Dizziness     related to neck issues  . Arthritis     back  . Joint pain   . Chronic back pain     buldging disc and scoliosis  . Neck pain   . Itching     down left arm and leg  . GERD (gastroesophageal reflux disease)     takes PRotonix daily  . History of colon polyps   . Diverticulosis   . Enlarged prostate     takes Flomax daily  . Macular degeneration     mild,beginning  . Pain in limb 06/20/2012  :  Past Surgical History  Procedure Laterality Date  . Hernia repair  2006    x 3   . Salivary gland removed      left   . Vein removed from left leg    . Colonoscopy    . Skin cancer removed from left side of face      basal cell carcinoma  . Cardiac catheterization  2010/2013    06/07/11 [facility]) LAD 25%, D2 75% (small), pLCx 100%, RCA 25%--medical managment   .  Anterior cervical decomp/discectomy fusion  01/31/2012    Procedure: ANTERIOR CERVICAL DECOMPRESSION/DISCECTOMY FUSION 1 LEVEL;  Surgeon: Floyce Stakes, MD;  Location: [facility];  Service: Neurosurgery;  Laterality: N/A;  Cervical Five-Six  Anterior Cervical Decompression /Diskectomy /Fusion/Plate  :   Current outpatient prescriptions:  .  amLODipine (NORVASC) 5 MG tablet, Take 5 mg by mouth daily., Disp: , Rfl:  .  aspirin 81 MG tablet, Take 81 mg by mouth daily., Disp: , Rfl:  .  atorvastatin (LIPITOR) 20 MG tablet, Take 20 mg by mouth daily., Disp: , Rfl:  .  carvedilol (COREG) 6.25 MG tablet, Take 1 tablet (6.25 mg total) by mouth 2 (two) times daily with a meal., Disp: 180 tablet, Rfl: 3 .  docusate sodium (COLACE) 100 MG capsule, Take 100 mg by mouth 2 (two) times daily., Disp: , Rfl:  .  esomeprazole (NEXIUM) 40 MG capsule, Take 40 mg by mouth daily at 12 noon., Disp: , Rfl:  .  hydrochlorothiazide (MICROZIDE) 12.5 MG capsule, TAKE 1  CAPSULE ONE TIME DAILY, Disp: 90 capsule, Rfl: 0 .  hydrOXYzine (ATARAX/VISTARIL) 25 MG tablet, Take 25 mg by mouth as needed. , Disp: , Rfl:  .  isosorbide mononitrate (IMDUR) 30 MG 24 hr tablet, Take 1 tablet (30 mg total) by mouth 2 (two) times daily. (Patient taking differently: Take 60 mg by mouth daily. ), Disp: 180 tablet, Rfl: 3 .  LORazepam (ATIVAN) 1 MG tablet, Take 1.5 tablets (1.5 mg total) by mouth at bedtime., Disp: 135 tablet, Rfl: 0 .  montelukast (SINGULAIR) 10 MG tablet, TAKE 1 TABLET AT BEDTIME, Disp: 90 tablet, Rfl: 0 .  nitroGLYCERIN (NITROSTAT) 0.4 MG SL tablet, Place 0.4 mg under the tongue every 5 (five) minutes as needed. For chest pain, Disp: , Rfl:  .  pantoprazole (PROTONIX) 40 MG tablet, Take 40 mg by mouth daily. , Disp: , Rfl:  .  prasugrel (EFFIENT) 10 MG TABS tablet, Take 10 mg by mouth daily., Disp: , Rfl:  .  ranitidine (ZANTAC) 75 MG tablet, Take 75 mg by mouth as needed. , Disp: , Rfl:  .  simvastatin (ZOCOR) 20 MG tablet, Take 1 tablet (20 mg total) by mouth every evening., Disp: 90 tablet, Rfl: 3:  :  Allergies  Allergen Reactions  . Hydrocodone-Acetaminophen Itching  . Other Itching    Flu-like symptoms from portabello mushrooms  . Codeine Itching  . Mushroom Extract Complex Nausea And Vomiting    Only a certain type of mushroom (name unknown) causes this reaction.  :  Family History  Problem Relation Age of Onset  . Cancer Sister   . Cancer Sister   . Hypertension Mother   . Heart attack Father   :  History   Social History  . Marital Status: Married    Spouse Name: N/A  . Number of Children: N/A  . Years of Education: N/A   Occupational History  . Not on file.   Social History Main Topics  . Smoking status: Former Smoker -- 30 years    Types: Pipe    Quit date: 11/24/2011  . Smokeless tobacco: Former Systems developer    Quit date: 02/15/2011     Comment: quit tobacco 3 years ago  . Alcohol Use: 0.0 oz/week    0 Standard drinks or  equivalent per week     Comment: 1-2 glasses of wine daily  . Drug Use: No  . Sexual Activity: Yes   Other Topics Concern  . Not  on file   Social History Narrative  :  Pertinent items are noted in HPI.  Exam: _0 @  well-developed and well-nourished white gentleman in no obvious distress. Vital signs show a temperature of 97.6. Pulse 55. Blood pressure 140/69. Weight is 197 pounds. Head and neck exam shows no ocular or oral lesions. He has no adenopathy in the neck. There is no palpable thyroid. Lungs are clear. Cardiac exam regular rate and rhythm with no murmurs, rubs or bruits. Abdomen is soft. He has good bowel sounds. There is no fluid wave. There is no palpable liver or spleen tip. Back exam shows no tenderness over the spine, ribs or hips. Extremities shows no clubbing, cyanosis or edema. Skin exam shows no rashes, ecchymoses or petechia. Neurological exam shows no focal neurological deficits.    Recent Labs  07/25/14 1348  WBC 6.2  HGB 11.0*  HCT 33.3*  PLT 233   No results for input(s): NA, K, CL, CO2, GLUCOSE, BUN, CREATININE, CALCIUM in the last 72 hours.  Blood smear review: Normochromic and normocytic population of red blood cells. There is no obvious rouleaux formation. There is no nucleated red blood cells. He has no target cells. He has no schistocytes or spherocytes. White blood cells appear normal in morphology and maturation. There is no immature myeloid or lymphoid forms. Platelets are adequate in number and size.  Pathology: None     Assessment and Plan: [name] [name] is a really nice 74 year old gentleman. He has at least IgG kappa smoldering myeloma. I did a row questions whether or not he actually has myeloma.  We will get a bone survey on him today. This certainly could indicate whether or not he has myeloma if he has lytic lesions elsewhere. I would think that if that were the case, that he would have had a positive PET scan.  I think that he will  need to have a bone marrow biopsy. I think this would be very helpful as we could also get cytogenetics and see if there is abnormal cytogenetics with the plasma cells.  If he does have myeloma, then he would be a good candidate for systemic therapy. I would like to think that he would respond to standard therapy.  Are not sure that he would be considered a stem cell transplant candidate because of his age. However, that is not totally ruled out.  We will try to get the bone marrow biopsy done in the next couple weeks.  I will try to take care of a lot of the interactions over the phone to make it easier for him.  I spent about an hour with he and his wife. I answered all their questions. I showed them the lab results that we have back so far. I explained the differences between MGUS, smoldering myeloma, and multiple myeloma.

## 2014-07-28 LAB — ERYTHROPOIETIN: Erythropoietin: 51.1 m[IU]/mL — ABNORMAL HIGH (ref 2.6–18.5)

## 2014-07-29 LAB — PROTEIN ELECTROPHORESIS, SERUM, WITH REFLEX
ABNORMAL PROTEIN BAND1: 1.8 g/dL
Albumin ELP: 3.4 g/dL — ABNORMAL LOW (ref 3.8–4.8)
Alpha-1-Globulin: 0.3 g/dL (ref 0.2–0.3)
Alpha-2-Globulin: 0.7 g/dL (ref 0.5–0.9)
BETA 2: 0.2 g/dL (ref 0.2–0.5)
Beta Globulin: 2.2 g/dL — ABNORMAL HIGH (ref 0.4–0.6)
Gamma Globulin: 0.2 g/dL — ABNORMAL LOW (ref 0.8–1.7)
Total Protein, Serum Electrophoresis: 7 g/dL (ref 6.1–8.1)

## 2014-07-29 LAB — IFE INTERPRETATION

## 2014-07-29 LAB — KAPPA/LAMBDA LIGHT CHAINS
KAPPA FREE LGHT CHN: 182 mg/dL — AB (ref 0.33–1.94)
Kappa:Lambda Ratio: 187.63 — ABNORMAL HIGH (ref 0.26–1.65)
Lambda Free Lght Chn: 0.97 mg/dL (ref 0.57–2.63)

## 2014-07-29 LAB — IGG, IGA, IGM
IgA: 11 mg/dL — ABNORMAL LOW (ref 68–379)
IgG (Immunoglobin G), Serum: 1820 mg/dL — ABNORMAL HIGH (ref 650–1600)
IgM, Serum: 10 mg/dL — ABNORMAL LOW (ref 41–251)

## 2014-07-29 LAB — BETA 2 MICROGLOBULIN, SERUM: Beta-2 Microglobulin: 2.94 mg/L — ABNORMAL HIGH (ref <=2.51)

## 2014-07-29 LAB — LACTATE DEHYDROGENASE: LDH: 136 U/L (ref 94–250)

## 2014-08-04 ENCOUNTER — Other Ambulatory Visit: Payer: Self-pay | Admitting: Radiology

## 2014-08-05 ENCOUNTER — Encounter: Payer: Self-pay | Admitting: Sports Medicine

## 2014-08-05 ENCOUNTER — Other Ambulatory Visit: Payer: Self-pay | Admitting: Radiology

## 2014-08-05 NOTE — Telephone Encounter (Signed)
Hi Rhonda, Looks like Langley never got his ativan rx.  Would you look into this, looks like we did fax it to his mail order pharmacy, either way I am happy to sign another rx.

## 2014-08-06 ENCOUNTER — Encounter (HOSPITAL_COMMUNITY): Payer: Self-pay

## 2014-08-06 ENCOUNTER — Ambulatory Visit (HOSPITAL_COMMUNITY)
Admission: RE | Admit: 2014-08-06 | Discharge: 2014-08-06 | Disposition: A | Discharge status: Home / Self Care | Payer: Medicare Other | Source: Ambulatory Visit | Attending: Hematology & Oncology | Admitting: Hematology & Oncology

## 2014-08-06 DIAGNOSIS — D472 Monoclonal gammopathy: Secondary | ICD-10-CM

## 2014-08-06 DIAGNOSIS — D4989 Neoplasm of unspecified behavior of other specified sites: Secondary | ICD-10-CM | POA: Diagnosis not present

## 2014-08-06 DIAGNOSIS — D649 Anemia, unspecified: Secondary | ICD-10-CM | POA: Diagnosis not present

## 2014-08-06 HISTORY — DX: Malignant (primary) neoplasm, unspecified: C80.1

## 2014-08-06 LAB — APTT: aPTT: 25 seconds (ref 24–37)

## 2014-08-06 LAB — CBC
HCT: 34.6 % — ABNORMAL LOW (ref 39.0–52.0)
Hemoglobin: 11.1 g/dL — ABNORMAL LOW (ref 13.0–17.0)
MCH: 28.2 pg (ref 26.0–34.0)
MCHC: 32.1 g/dL (ref 30.0–36.0)
MCV: 88 fL (ref 78.0–100.0)
PLATELETS: 206 10*3/uL (ref 150–400)
RBC: 3.93 MIL/uL — ABNORMAL LOW (ref 4.22–5.81)
RDW: 15.5 % (ref 11.5–15.5)
WBC: 5.1 10*3/uL (ref 4.0–10.5)

## 2014-08-06 LAB — PROTIME-INR
INR: 1.06 (ref 0.00–1.49)
Prothrombin Time: 14 seconds (ref 11.6–15.2)

## 2014-08-06 LAB — BONE MARROW EXAM

## 2014-08-06 MED ORDER — SODIUM CHLORIDE 0.9 % IV SOLN
Freq: Once | INTRAVENOUS | Status: AC
  Administered 2014-08-06: 09:00:00 via INTRAVENOUS

## 2014-08-06 MED ORDER — MIDAZOLAM HCL 2 MG/2ML IJ SOLN
INTRAMUSCULAR | Status: AC
  Filled 2014-08-06: qty 6

## 2014-08-06 MED ORDER — MIDAZOLAM HCL 2 MG/2ML IJ SOLN
INTRAMUSCULAR | Status: AC | PRN
  Administered 2014-08-06 (×2): 1 mg via INTRAVENOUS

## 2014-08-06 MED ORDER — FENTANYL CITRATE (PF) 100 MCG/2ML IJ SOLN
INTRAMUSCULAR | Status: AC
  Filled 2014-08-06: qty 4

## 2014-08-06 MED ORDER — FENTANYL CITRATE (PF) 100 MCG/2ML IJ SOLN
INTRAMUSCULAR | Status: AC | PRN
  Administered 2014-08-06: 50 ug via INTRAVENOUS

## 2014-08-06 NOTE — Procedures (Signed)
Technically successful CT guided bone marrow aspiration and biopsy of left iliac crest. No immediate complications.   

## 2014-08-06 NOTE — Discharge Instructions (Signed)

## 2014-08-06 NOTE — H&P (Signed)
Chief Complaint: "I'm here for a bone marrow biopsy"  Referring Physician(s): Ennever,Peter R  History of Present Illness: [name] [name] is a 74 y.o. male with prior history of left shoulder pain, IgG Spike on serum immunofixation studies and recent imaging revealing widespread multiple lucencies throughout the skull, both upper extremities and both lower extremities suspicious for multiple myeloma. He presents today for CT-guided bone marrow biopsy for further evaluation.  Past Medical History  Diagnosis Date  . Myocardial infarction 2010  . Complication of anesthesia     severe case of hiccups after hernia surgery;required medication  . Hyperlipidemia     takes Zocor daily  . Hypertension     takes HCTZ,Diovan daily and Isosorbide daily  . Asthma     uses Advair bid and asmanex prn  . Emphysema   . Pneumonia     hx of---12+yrs ago  . Chronic bronchitis     couple of yrs ago  . Dizziness     related to neck issues  . Arthritis     back  . Joint pain   . Chronic back pain     buldging disc and scoliosis  . Neck pain   . Itching     down left arm and leg  . GERD (gastroesophageal reflux disease)     takes PRotonix daily  . History of colon polyps   . Diverticulosis   . Enlarged prostate     takes Flomax daily  . Macular degeneration     mild,beginning  . Pain in limb 06/20/2012  . Cancer     multiple myeloma    Past Surgical History  Procedure Laterality Date  . Hernia repair  2006    x 3   . Salivary gland removed      left   . Vein removed from left leg    . Colonoscopy    . Skin cancer removed from left side of face      basal cell carcinoma  . Cardiac catheterization  2010/2013    06/07/11 [facility]) LAD 25%, D2 75% (small), pLCx 100%, RCA 25%--medical managment   . Anterior cervical decomp/discectomy fusion  01/31/2012    Procedure: ANTERIOR CERVICAL DECOMPRESSION/DISCECTOMY FUSION 1 LEVEL;  Surgeon: Floyce Stakes, MD;  Location: [facility];   Service: Neurosurgery;  Laterality: N/A;  Cervical Five-Six  Anterior Cervical Decompression /Diskectomy /Fusion/Plate    Allergies: Hydrocodone-acetaminophen; Other; Codeine; and Mushroom extract complex  Medications: Prior to Admission medications   Medication Sig Start Date End Date Taking? Authorizing Provider  amLODipine (NORVASC) 5 MG tablet Take 5 mg by mouth daily.   Yes Historical Provider, MD  clindamycin (CLEOCIN T) 1 % lotion Apply 1 application topically 2 (two) times daily.   Yes Historical Provider, MD  hydrochlorothiazide (MICROZIDE) 12.5 MG capsule TAKE 1 CAPSULE ONE TIME DAILY 05/26/14  Yes Silverio Decamp, MD  isosorbide mononitrate (IMDUR) 30 MG 24 hr tablet Take 1 tablet (30 mg total) by mouth 2 (two) times daily. Patient taking differently: Take 60 mg by mouth daily.  07/09/13  Yes Silverio Decamp, MD  prasugrel (EFFIENT) 10 MG TABS tablet Take 10 mg by mouth daily.   Yes Historical Provider, MD  aspirin 81 MG tablet Take 81 mg by mouth daily.    Historical Provider, MD  atorvastatin (LIPITOR) 20 MG tablet Take 20 mg by mouth daily.    Historical Provider, MD  carvedilol (COREG) 6.25 MG tablet Take 1 tablet (6.25 mg  total) by mouth 2 (two) times daily with a meal. 07/24/13   Silverio Decamp, MD  clobetasol (TEMOVATE) 0.05 % external solution Apply 1 application topically 2 (two) times daily. For 2 weeks at a time    Historical Provider, MD  esomeprazole (NEXIUM) 40 MG capsule Take 40 mg by mouth daily at 12 noon.    Historical Provider, MD  LORazepam (ATIVAN) 1 MG tablet Take 1.5 tablets (1.5 mg total) by mouth at bedtime. 07/21/14   Silverio Decamp, MD  montelukast (SINGULAIR) 10 MG tablet TAKE 1 TABLET AT BEDTIME 05/26/14   Silverio Decamp, MD  nitroGLYCERIN (NITROSTAT) 0.4 MG SL tablet Place 0.4 mg under the tongue every 5 (five) minutes as needed. For chest pain    Historical Provider, MD  simvastatin (ZOCOR) 20 MG tablet Take 1 tablet (20 mg  total) by mouth every evening. Patient not taking: Reported on 08/01/2014 02/19/13   Silverio Decamp, MD     Family History  Problem Relation Age of Onset  . Cancer Sister   . Cancer Sister   . Hypertension Mother   . Heart attack Father     History   Social History  . Marital Status: Married    Spouse Name: N/A  . Number of Children: N/A  . Years of Education: N/A   Social History Main Topics  . Smoking status: Former Smoker -- 30 years    Types: Pipe    Quit date: 11/24/2011  . Smokeless tobacco: Former Systems developer    Quit date: 02/15/2011     Comment: quit tobacco 3 years ago  . Alcohol Use: 0.0 oz/week    0 Standard drinks or equivalent per week     Comment: 1-2 glasses of wine daily  . Drug Use: No  . Sexual Activity: Yes   Other Topics Concern  . Not on file   Social History Narrative      Review of Systems  Constitutional: Negative for fever and chills.  Respiratory: Negative for cough and shortness of breath.   Cardiovascular: Negative for chest pain.  Gastrointestinal: Negative for nausea, vomiting, abdominal pain and blood in stool.  Genitourinary: Negative for dysuria and hematuria.  Musculoskeletal: Positive for back pain.       Left shoulder pain; joint pain  Neurological: Positive for headaches.  Hematological: Does not bruise/bleed easily.    Vital Signs: BP 124/69 mmHg  Pulse 50  Temp(Src) 97.9 F (36.6 C) (Oral)  SpO2 97%  Physical Exam  Constitutional: He is oriented to person, place, and time. He appears well-developed and well-nourished.  Cardiovascular: Regular rhythm.   Bradycardic  Pulmonary/Chest: Effort normal.  Few fine bibasilar crackles  Abdominal: Soft. Bowel sounds are normal. There is no tenderness.  Musculoskeletal: Normal range of motion. He exhibits no edema.  Neurological: He is alert and oriented to person, place, and time.    Mallampati Score:     Imaging: Dg Bone Survey Met  07/25/2014   CLINICAL DATA:   Evaluate for myeloma.  EXAM: METASTATIC BONE SURVEY  COMPARISON:  PET-CT 06/27/2014.  FINDINGS: Widespread multiple lucencies are noted throughout the skull, both upper extremities, both lower extremities. These findings are consistent with multiple myeloma particularly in light of the previous recess PET-CT demonstrated no significant bony upper. Diffuse cervical, thoracic, lumbar spine, and thoracic cage severe osteopenia is present. This also may be secondary to diffuse myelomatous involvement. No pathologic fracture noted. Prior cervical spine fusion.  IMPRESSION: Diffuse widespread changes of multiple  myeloma.   Electronically Signed   By: Marcello Moores  Register   On: 07/25/2014 15:39    Labs:  CBC:  Recent Labs  12/27/13 1131 06/18/14 0819 07/25/14 1348 08/06/14 0925  WBC 5.4 7.5 6.2 5.1  HGB 11.2* 11.2* 11.0* 11.1*  HCT 34.3* 34.4* 33.3* 34.6*  PLT 228 290 233 206    COAGS:  Recent Labs  08/06/14 0925  INR 1.06  APTT 25    BMP:  Recent Labs  12/27/13 1131 06/18/14 0819 07/25/14 1421  NA 140 139 140  K 3.5 4.3 3.4*  CL 101 101 102  CO2 $Re'27 25 25  'FOV$ GLUCOSE 92 110* 109*  BUN $Re'17 21 16  'eXo$ CALCIUM 9.3 9.6 9.3  CREATININE 1.20 1.18 1.13    LIVER FUNCTION TESTS:  Recent Labs  12/27/13 1131 06/18/14 0819 07/25/14 1421  BILITOT 0.6 0.4 0.4  AST $Re'19 15 19  'PQS$ ALT $R'23 19 24  'nY$ ALKPHOS 101 88 97  PROT 7.4 7.9 7.4  ALBUMIN 3.8 3.9 3.6    TUMOR MARKERS: No results for input(s): AFPTM, CEA, CA199, CHROMGRNA in the last 8760 hours.  Assessment and Plan: [name] [name] is a 74 y.o. male with prior history of left shoulder pain, IgG Spike on serum immunofixation studies and recent imaging revealing widespread multiple lucencies throughout the skull, both upper extremities and both lower extremities suspicious for multiple myeloma. He presents today for CT-guided bone marrow biopsy for further evaluation.Risks and benefits discussed with the patient including, but not limited to  bleeding, infection, damage to adjacent structures or low yield requiring additional tests.All of the patient's questions were answered, patient is agreeable to proceed.Consent signed and in chart.       Signed: D. Rowe Robert 08/06/2014, 10:09 AM   I spent a total of 20 minutes  in face to face in clinical consultation, greater than 50% of which was counseling/coordinating care for CT-guided bone marrow biopsy

## 2014-08-07 ENCOUNTER — Ambulatory Visit (HOSPITAL_COMMUNITY): Payer: Medicare Other

## 2014-08-07 ENCOUNTER — Other Ambulatory Visit (HOSPITAL_COMMUNITY): Payer: Medicare Other

## 2014-08-11 ENCOUNTER — Other Ambulatory Visit: Payer: Self-pay | Admitting: Hematology & Oncology

## 2014-08-13 ENCOUNTER — Encounter: Payer: Self-pay | Admitting: Hematology & Oncology

## 2014-08-13 ENCOUNTER — Other Ambulatory Visit: Payer: Self-pay | Admitting: Hematology & Oncology

## 2014-08-13 DIAGNOSIS — C9000 Multiple myeloma not having achieved remission: Secondary | ICD-10-CM

## 2014-08-13 HISTORY — DX: Multiple myeloma not having achieved remission: C90.00

## 2014-08-15 ENCOUNTER — Other Ambulatory Visit: Payer: Self-pay | Admitting: Sports Medicine

## 2014-08-15 ENCOUNTER — Encounter: Payer: Self-pay | Admitting: Sports Medicine

## 2014-08-15 ENCOUNTER — Other Ambulatory Visit: Payer: Self-pay | Admitting: *Deleted

## 2014-08-15 ENCOUNTER — Ambulatory Visit (INDEPENDENT_AMBULATORY_CARE_PROVIDER_SITE_OTHER): Payer: Medicare Other | Admitting: Sports Medicine

## 2014-08-15 VITALS — BP 139/79 | HR 82 | Ht 72.0 in | Wt 200.0 lb

## 2014-08-15 DIAGNOSIS — I2510 Atherosclerotic heart disease of native coronary artery without angina pectoris: Secondary | ICD-10-CM

## 2014-08-15 DIAGNOSIS — G629 Polyneuropathy, unspecified: Secondary | ICD-10-CM

## 2014-08-15 DIAGNOSIS — I10 Essential (primary) hypertension: Secondary | ICD-10-CM

## 2014-08-15 DIAGNOSIS — M5030 Other cervical disc degeneration, unspecified cervical region: Secondary | ICD-10-CM | POA: Diagnosis not present

## 2014-08-15 DIAGNOSIS — G4700 Insomnia, unspecified: Secondary | ICD-10-CM | POA: Diagnosis not present

## 2014-08-15 DIAGNOSIS — M7542 Impingement syndrome of left shoulder: Secondary | ICD-10-CM

## 2014-08-15 MED ORDER — HYDROCHLOROTHIAZIDE 12.5 MG PO CAPS: 12.5000 mg | ORAL_CAPSULE | Freq: Every day | ORAL | Status: DC

## 2014-08-15 MED ORDER — MONTELUKAST SODIUM 10 MG PO TABS: 10.0000 mg | ORAL_TABLET | Freq: Every day | ORAL | Status: DC

## 2014-08-15 MED ORDER — ISOSORBIDE MONONITRATE ER 30 MG PO TB24: 30.0000 mg | ORAL_TABLET | Freq: Two times a day (BID) | ORAL | Status: DC

## 2014-08-15 MED ORDER — AMITRIPTYLINE HCL 50 MG PO TABS: ORAL_TABLET | ORAL | Status: DC

## 2014-08-15 MED ORDER — ATORVASTATIN CALCIUM 20 MG PO TABS: 20.0000 mg | ORAL_TABLET | Freq: Every day | ORAL | Status: DC

## 2014-08-15 MED ORDER — AMLODIPINE BESYLATE 5 MG PO TABS: 5.0000 mg | ORAL_TABLET | Freq: Every day | ORAL | Status: DC

## 2014-08-15 MED ORDER — CARVEDILOL 6.25 MG PO TABS: 6.2500 mg | ORAL_TABLET | Freq: Two times a day (BID) | ORAL | Status: DC

## 2014-08-15 NOTE — Assessment & Plan Note (Signed)
Patient will continue Ativan 1.5 mg daily at bedtime. We will also probably get a response from adding amitriptyline as well. Future options could also include gabapentin and Belsomra.

## 2014-08-15 NOTE — Assessment & Plan Note (Signed)
Suspect this is secondary to multiple myeloma. Also adding amitriptyline. We are going to get a nerve conduction study. Future options could include gabapentin.

## 2014-08-15 NOTE — Assessment & Plan Note (Signed)
With some left-sided radicular symptoms, also low back pain post bone marrow biopsy. I'm uncertain as to whether his radicular paresthesias represent cervical and lumbar radiculopathy versus peripheral neuropathy from his bone marrow cancer. Nerve conduction study will help elucidate this.

## 2014-08-15 NOTE — Progress Notes (Addendum)
  Subjective:    CC: Follow-up  HPI: Left shoulder pain: Resolved after subacromial injection and rehabilitation exercises.  Peripheral neuropathy: Notes numbness and tingling in both upper and lower extremity, worse on the left side of the upper extreme me, he is post-cervical fusion. He also has multiple myeloma.  Insomnia: Currently doing lorazepam, we did discuss how this was not the best agent for sleep, but for now I'm not going to make any changes. Certainly Belsomra, gabapentin, or amitriptyline considering neuropathy could be a better option.  Past medical history, Surgical history, Family history not pertinant except as noted below, Social history, Allergies, and medications have been entered into the medical record, reviewed, and no changes needed.   Review of Systems: No fevers, chills, night sweats, weight loss, chest pain, or shortness of breath.   Objective:    General: Well Developed, well nourished, and in no acute distress.  Neuro: Alert and oriented x3, extra-ocular muscles intact, sensation grossly intact.  HEENT: Normocephalic, atraumatic, pupils equal round reactive to light, neck supple, no masses, no lymphadenopathy, thyroid nonpalpable.  Skin: Warm and dry, no rashes. Cardiac: Regular rate and rhythm, no murmurs rubs or gallops, no lower extremity edema.  Respiratory: Clear to auscultation bilaterally. Not using accessory muscles, speaking in full sentences.  Impression and Recommendations:    I spent 40 minutes with this patient, greater than 50% was face-to-face time counseling regarding the above diagnoses.

## 2014-08-15 NOTE — Assessment & Plan Note (Signed)
Responded completely after physical therapy and subacromial injection at the last visit.

## 2014-08-16 ENCOUNTER — Encounter: Payer: Self-pay | Admitting: Sports Medicine

## 2014-08-19 ENCOUNTER — Encounter: Payer: Self-pay | Admitting: Hematology & Oncology

## 2014-08-19 ENCOUNTER — Ambulatory Visit (HOSPITAL_BASED_OUTPATIENT_CLINIC_OR_DEPARTMENT_OTHER): Payer: Medicare Other | Admitting: Hematology & Oncology

## 2014-08-19 ENCOUNTER — Other Ambulatory Visit: Payer: Medicare Other

## 2014-08-19 ENCOUNTER — Other Ambulatory Visit: Payer: Self-pay | Admitting: *Deleted

## 2014-08-19 VITALS — BP 121/55 | HR 72 | Temp 97.6°F | Resp 20 | Ht 72.0 in | Wt 198.0 lb

## 2014-08-19 DIAGNOSIS — C9000 Multiple myeloma not having achieved remission: Secondary | ICD-10-CM

## 2014-08-19 DIAGNOSIS — G629 Polyneuropathy, unspecified: Secondary | ICD-10-CM

## 2014-08-19 LAB — CHROMOSOME ANALYSIS, BONE MARROW

## 2014-08-19 LAB — TISSUE HYBRIDIZATION (BONE MARROW)-NCBH

## 2014-08-19 MED ORDER — DEXAMETHASONE 4 MG PO TABS
ORAL_TABLET | ORAL | Status: DC
Start: 2014-08-19 — End: 2015-04-23

## 2014-08-19 MED ORDER — TRAMADOL HCL 50 MG PO TABS: 50.0000 mg | ORAL_TABLET | Freq: Four times a day (QID) | ORAL | Status: DC | PRN

## 2014-08-19 MED ORDER — LENALIDOMIDE 25 MG PO CAPS: ORAL_CAPSULE | ORAL | Status: DC

## 2014-08-19 MED ORDER — FAMCICLOVIR 500 MG PO TABS: 500.0000 mg | ORAL_TABLET | Freq: Every day | ORAL | Status: DC

## 2014-08-19 MED ORDER — AMITRIPTYLINE HCL 50 MG PO TABS: ORAL_TABLET | ORAL | Status: DC

## 2014-08-19 NOTE — Progress Notes (Signed)
Hematology and Oncology Follow Up Visit  [name] [name] [id number] 1940/12/10 74 y.o. 08/19/2014   Principle Diagnosis:   IgG kappa myeloma  Current Therapy:    Patient start Velcade/Revlimid/Decadron  Zometa 4 mg IV every month     Interim History:  [name] [name] is back for follow-up. He does have a diagnosis of myeloma. We went ahead and did extensive workup on him. The workup showed that there was a large amount of light chain in his serum. He's not yet done a 24-hour urine for me. The amount light chain in his serum was 182 mg/dL.  His IgG level was 1820 mg/dL. His M spike was 1.8 g/dL.  He had depressed IgA and IgM levels.  He had a bone survey which showed extensive bony involvement by myeloma. He comes in today with a back brace on. We will get an MRI of his lumbar spine.  He did have a bone marrow biopsy done. This is done on 622. The pathology report ([id number]) showed plasma cell neoplasm. He had 20% plasma cells on differential. On immunohistochemical studies, he had 30% plasma cells.  We still are awaiting the results from the cytogenetics.  I think it is clear that we will have to start him on therapy. He is in great shape. He is borderline for a transplant. I will like to think that we can avoid a transplant if we get a very good response with chemotherapy.  Overall, his performance status is ECOG 1.  Medications:  Current outpatient prescriptions:  .  amLODipine (NORVASC) 5 MG tablet, Take 1 tablet (5 mg total) by mouth daily., Disp: 90 tablet, Rfl: 3 .  aspirin 81 MG tablet, Take 81 mg by mouth daily., Disp: , Rfl:  .  atorvastatin (LIPITOR) 20 MG tablet, Take 1 tablet (20 mg total) by mouth daily., Disp: 90 tablet, Rfl: 3 .  carvedilol (COREG) 6.25 MG tablet, Take 1 tablet (6.25 mg total) by mouth 2 (two) times daily with a meal., Disp: 180 tablet, Rfl: 3 .  clindamycin (CLEOCIN T) 1 % lotion, Apply 1 application topically 2 (two) times daily., Disp: , Rfl:  .   clobetasol (TEMOVATE) 0.05 % external solution, Apply 1 application topically 2 (two) times daily. For 2 weeks at a time, Disp: , Rfl:  .  dexamethasone (DECADRON) 4 MG tablet, Take 5 pills, with food, once a week., Disp: 100 tablet, Rfl: 3 .  esomeprazole (NEXIUM) 40 MG capsule, Take 40 mg by mouth daily at 12 noon., Disp: , Rfl:  .  famciclovir (FAMVIR) 500 MG tablet, Take 1 tablet (500 mg total) by mouth daily., Disp: 90 tablet, Rfl: 3 .  hydrochlorothiazide (MICROZIDE) 12.5 MG capsule, Take 1 capsule (12.5 mg total) by mouth daily., Disp: 90 capsule, Rfl: 3 .  isosorbide mononitrate (IMDUR) 30 MG 24 hr tablet, Take 1 tablet (30 mg total) by mouth 2 (two) times daily., Disp: 180 tablet, Rfl: 3 .  lenalidomide (REVLIMID) 25 MG capsule, Take 1 pill a day at bedtime for 21 days and then stop for 7 days., Disp: 21 capsule, Rfl: 6 .  LORazepam (ATIVAN) 1 MG tablet, Take 1.5 tablets (1.5 mg total) by mouth at bedtime., Disp: 135 tablet, Rfl: 0 .  montelukast (SINGULAIR) 10 MG tablet, Take 1 tablet (10 mg total) by mouth at bedtime., Disp: 90 tablet, Rfl: 3 .  nitroGLYCERIN (NITROSTAT) 0.4 MG SL tablet, Place 0.4 mg under the tongue every 5 (five) minutes as needed. For chest pain, Disp: ,  Rfl:  .  prasugrel (EFFIENT) 10 MG TABS tablet, Take 10 mg by mouth daily., Disp: , Rfl:  .  simvastatin (ZOCOR) 20 MG tablet, Take 1 tablet (20 mg total) by mouth every evening., Disp: 90 tablet, Rfl: 3 .  amitriptyline (ELAVIL) 50 MG tablet, One half tab PO qHS for a week, then one tab PO qHS. (Patient not taking: Reported on 08/19/2014), Disp: 30 tablet, Rfl: 3 .  traMADol (ULTRAM) 50 MG tablet, Take 1 tablet (50 mg total) by mouth every 6 (six) hours as needed for moderate pain., Disp: 90 tablet, Rfl: 2  Allergies:  Allergies  Allergen Reactions  . Hydrocodone-Acetaminophen Itching  . Other Itching    Flu-like symptoms from portabello mushrooms  . Codeine Itching  . Mushroom Extract Complex Nausea And Vomiting      Only a certain type of mushroom (name unknown) causes this reaction.    Past Medical History, Surgical history, Social history, and Family History were reviewed and updated.  Review of Systems: As above  Physical Exam:  height is 6' (1.829 m) and weight is 198 lb (89.812 kg). His oral temperature is 97.6 F (36.4 C). His blood pressure is 121/55 and his pulse is 72. His respiration is 20.   Wt Readings from Last 3 Encounters:  08/19/14 198 lb (89.812 kg)  08/15/14 200 lb (90.719 kg)  07/25/14 205 lb (92.987 kg)     Well-developed and well-nourished white woman in no obvious distress. Head and neck exam shows no ocular or oral lesions. There are no palpable cervical or supraclavicular lymph nodes. Lungs are clear. Cardiac exam regular rate and rhythm with no murmurs, rubs or bruits. Abdomen is soft. He has good bowel sounds. There is no fluid wave. There is no palpable liver or spleen tip. Back exam shows no tenderness over the spine, ribs or hips. He is wearing a back brace. Extremities shows no clubbing, cyanosis or edema. Neurological exam is nonfocal.   Impression and Plan: [name] [name] is 74 year old [place]. He clearly has myeloma. He has IgG kappa myeloma.  We will go ahead and get started on therapy. I think will clearly have to. We will see what his MRI shows.  Spent about an hour with he and his wife. I gave him information she's about each medicine that we are going to use.  I need to make sure that he does a 24-hour urine area and somehow, i.e. probably left without having this with him.  I really believe that the response rates to be 90%.  He will be on Famvir to help with shingles prophylaxis.  He also will be on baby aspirin. He already is on Effient.  We will go ahead and get started next week.  He understands the side effects of treatment. He wishes to proceed.   [name] Napoleon, MD 7/5/20161:32 PM

## 2014-08-21 ENCOUNTER — Other Ambulatory Visit: Payer: Medicare Other

## 2014-08-21 ENCOUNTER — Other Ambulatory Visit: Payer: Self-pay | Admitting: *Deleted

## 2014-08-21 DIAGNOSIS — C9000 Multiple myeloma not having achieved remission: Secondary | ICD-10-CM

## 2014-08-22 ENCOUNTER — Ambulatory Visit (HOSPITAL_COMMUNITY)
Admission: RE | Admit: 2014-08-22 | Discharge: 2014-08-22 | Disposition: A | Discharge status: Home / Self Care | Payer: Medicare Other | Source: Ambulatory Visit | Attending: Hematology & Oncology | Admitting: Hematology & Oncology

## 2014-08-22 ENCOUNTER — Telehealth: Payer: Self-pay | Admitting: *Deleted

## 2014-08-22 ENCOUNTER — Other Ambulatory Visit: Payer: Medicare Other

## 2014-08-22 DIAGNOSIS — M47816 Spondylosis without myelopathy or radiculopathy, lumbar region: Secondary | ICD-10-CM | POA: Diagnosis not present

## 2014-08-22 DIAGNOSIS — M5137 Other intervertebral disc degeneration, lumbosacral region: Secondary | ICD-10-CM | POA: Diagnosis not present

## 2014-08-22 DIAGNOSIS — N281 Cyst of kidney, acquired: Secondary | ICD-10-CM | POA: Insufficient documentation

## 2014-08-22 DIAGNOSIS — C9000 Multiple myeloma not having achieved remission: Secondary | ICD-10-CM

## 2014-08-22 DIAGNOSIS — M5126 Other intervertebral disc displacement, lumbar region: Secondary | ICD-10-CM | POA: Insufficient documentation

## 2014-08-22 DIAGNOSIS — M545 Low back pain: Secondary | ICD-10-CM | POA: Diagnosis present

## 2014-08-22 DIAGNOSIS — M4806 Spinal stenosis, lumbar region: Secondary | ICD-10-CM | POA: Diagnosis not present

## 2014-08-22 MED ORDER — GADOBENATE DIMEGLUMINE 529 MG/ML IV SOLN
20.0000 mL | Freq: Once | INTRAVENOUS | Status: AC
  Administered 2014-08-22: 19 mL via INTRAVENOUS

## 2014-08-22 NOTE — Telephone Encounter (Signed)
Patient notified us that he missed his chemo education class this morning. He is scheduled to start Velcade on Monday. The office will need to provide him chemo education during his appointment. Notation made to appointment time. Patient also trying to follow up on his Revlimid. Informed patient that his prescription had been transferred to a different pharmacy due to insurance requirements and that would probably account for delay. He requested pharmacy phone number for follow up. Provided patient with number.

## 2014-08-25 ENCOUNTER — Telehealth: Payer: Self-pay | Admitting: Nurse Practitioner

## 2014-08-25 ENCOUNTER — Encounter: Payer: Self-pay | Admitting: *Deleted

## 2014-08-25 ENCOUNTER — Other Ambulatory Visit: Payer: Medicare Other

## 2014-08-25 ENCOUNTER — Ambulatory Visit (HOSPITAL_BASED_OUTPATIENT_CLINIC_OR_DEPARTMENT_OTHER): Payer: Medicare Other

## 2014-08-25 VITALS — BP 142/68 | HR 68 | Temp 97.2°F | Resp 18

## 2014-08-25 DIAGNOSIS — Z5112 Encounter for antineoplastic immunotherapy: Secondary | ICD-10-CM

## 2014-08-25 DIAGNOSIS — C9000 Multiple myeloma not having achieved remission: Secondary | ICD-10-CM

## 2014-08-25 MED ORDER — ONDANSETRON HCL 8 MG PO TABS
8.0000 mg | ORAL_TABLET | Freq: Once | ORAL | Status: AC
  Administered 2014-08-25: 8 mg via ORAL

## 2014-08-25 MED ORDER — ONDANSETRON HCL 8 MG PO TABS
ORAL_TABLET | ORAL | Status: AC
  Filled 2014-08-25: qty 1

## 2014-08-25 MED ORDER — BORTEZOMIB CHEMO SQ INJECTION 3.5 MG (2.5MG/ML)
1.3000 mg/m2 | Freq: Once | INTRAMUSCULAR | Status: AC
  Administered 2014-08-25: 2.75 mg via SUBCUTANEOUS
  Filled 2014-08-25: qty 2.75

## 2014-08-25 MED ORDER — PROCHLORPERAZINE MALEATE 10 MG PO TABS: 10.0000 mg | ORAL_TABLET | Freq: Four times a day (QID) | ORAL | Status: DC | PRN

## 2014-08-25 MED ORDER — ONDANSETRON HCL 8 MG PO TABS: 8.0000 mg | ORAL_TABLET | Freq: Two times a day (BID) | ORAL | Status: DC

## 2014-08-25 NOTE — Telephone Encounter (Signed)
Received a fax that was dated 08/22/14 6pm requesting a response to conjunction Dexamethasone with Revlimid. Due to office being closed the request was not completed by the end of the weekend. Contacted [employer] drug authorization and was required to complete an appeal. Completed an expedited appeal and supporting clinicals faxed over for review. Dexamethasone is being used with Revlimid, which is the parameters for approval. Pt is actually on 4mg  tabs weekly. Ref # for appeal is [id number]. We should expect a response within 72hrs per the appeal representative.

## 2014-08-25 NOTE — Progress Notes (Signed)
Message given to patient while here in office from Dr. Marin Olp :  NO obvious myeloma is causing back problems. Looks like there is a lot of degenerative issues and spinal stenosis

## 2014-08-25 NOTE — Telephone Encounter (Signed)
Revlimid appeal was approved. Ref # [id number] for 21-28 day 25mg  caps until 08/25/2015.

## 2014-08-25 NOTE — Patient Instructions (Signed)
[facility] Discharge Instructions for Patients Receiving Chemotherapy  Today you received the following chemotherapy agents Velcade,  To help prevent nausea and vomiting after your treatment, we encourage you to take your nausea medication   1) Zofran 8mg  every 8 hours as needed for nausea or vomiting   If you develop nausea and vomiting that is not controlled by your nausea medication, call the clinic.   BELOW ARE SYMPTOMS THAT SHOULD BE REPORTED IMMEDIATELY:  *FEVER GREATER THAN 100.5 F  *CHILLS WITH OR WITHOUT FEVER  NAUSEA AND VOMITING THAT IS NOT CONTROLLED WITH YOUR NAUSEA MEDICATION  *UNUSUAL SHORTNESS OF BREATH  *UNUSUAL BRUISING OR BLEEDING  TENDERNESS IN MOUTH AND THROAT WITH OR WITHOUT PRESENCE OF ULCERS  *URINARY PROBLEMS  *BOWEL PROBLEMS  UNUSUAL RASH Items with * indicate a potential emergency and should be followed up as soon as possible.  Feel free to call the clinic you have any questions or concerns. The clinic phone number is [phone].  Please show the [facility] at check-in to the Emergency Department and triage nurse.

## 2014-08-26 ENCOUNTER — Telehealth: Payer: Self-pay | Admitting: *Deleted

## 2014-08-26 DIAGNOSIS — R918 Other nonspecific abnormal finding of lung field: Secondary | ICD-10-CM | POA: Diagnosis not present

## 2014-08-26 LAB — UIFE/LIGHT CHAINS/TP QN, 24-HR UR
Albumin, U: DETECTED
Alpha 1, Urine: DETECTED — AB
Alpha 2, Urine: DETECTED — AB
Beta, Urine: DETECTED — AB
Gamma Globulin, Urine: DETECTED — AB
TIME-UPE24: 24 h
TOTAL PROTEIN, URINE-UPE24: 23 mg/dL (ref 5–25)
Total Protein, Urine-Ur/day: 690 mg/d — ABNORMAL HIGH (ref <150)
VOLUME, URINE-UPE24: 3000 mL

## 2014-08-26 LAB — 24 HR URINE,KAPPA/LAMBDA LIGHT CHAINS
MEASURED KAPPA CHAIN: 25.3 mg/dL — AB (ref <2.00)
TOTAL KAPPA CHAIN: 759 mg/(24.h)
URINE VOLUME: 3000 mL/(24.h)

## 2014-08-26 NOTE — Telephone Encounter (Signed)
Patient is doing well. He isn't experiencing any symptoms. He doesn't have any follow up questions. He has filled his prn antiemetic medications and understand how and when to use them.  He has not yet heard from the pharmacy regarding his revlimid which was approved via insurance yesterday. He will call the pharmacy. Instructed to patient to call the office tomorrow if he isn't able to confirm shipment from the pharmacy. He understands this.

## 2014-08-29 ENCOUNTER — Encounter: Payer: Self-pay | Admitting: Sports Medicine

## 2014-08-29 ENCOUNTER — Other Ambulatory Visit (HOSPITAL_BASED_OUTPATIENT_CLINIC_OR_DEPARTMENT_OTHER): Payer: Medicare Other

## 2014-08-29 DIAGNOSIS — E785 Hyperlipidemia, unspecified: Secondary | ICD-10-CM | POA: Diagnosis not present

## 2014-08-29 DIAGNOSIS — C9000 Multiple myeloma not having achieved remission: Secondary | ICD-10-CM | POA: Diagnosis present

## 2014-08-29 LAB — COMPREHENSIVE METABOLIC PANEL
ALK PHOS: 88 U/L (ref 39–117)
ALT: 35 U/L (ref 0–53)
AST: 22 U/L (ref 0–37)
Albumin: 3.5 g/dL (ref 3.5–5.2)
BUN: 14 mg/dL (ref 6–23)
CALCIUM: 8.9 mg/dL (ref 8.4–10.5)
CHLORIDE: 99 meq/L (ref 96–112)
CO2: 29 meq/L (ref 19–32)
Creatinine, Ser: 0.99 mg/dL (ref 0.50–1.35)
Glucose, Bld: 85 mg/dL (ref 70–99)
Potassium: 3.4 mEq/L — ABNORMAL LOW (ref 3.5–5.3)
SODIUM: 139 meq/L (ref 135–145)
TOTAL PROTEIN: 7 g/dL (ref 6.0–8.3)
Total Bilirubin: 0.4 mg/dL (ref 0.2–1.2)

## 2014-08-29 LAB — CBC WITH DIFFERENTIAL (CANCER CENTER ONLY)
BASO#: 0 10*3/uL (ref 0.0–0.2)
BASO%: 0.3 % (ref 0.0–2.0)
EOS%: 1.7 % (ref 0.0–7.0)
Eosinophils Absolute: 0.1 10*3/uL (ref 0.0–0.5)
HCT: 32.6 % — ABNORMAL LOW (ref 38.7–49.9)
HEMOGLOBIN: 11 g/dL — AB (ref 13.0–17.1)
LYMPH#: 2.2 10*3/uL (ref 0.9–3.3)
LYMPH%: 31.7 % (ref 14.0–48.0)
MCH: 29.3 pg (ref 28.0–33.4)
MCHC: 33.7 g/dL (ref 32.0–35.9)
MCV: 87 fL (ref 82–98)
MONO#: 1 10*3/uL — AB (ref 0.1–0.9)
MONO%: 14.4 % — AB (ref 0.0–13.0)
NEUT#: 3.7 10*3/uL (ref 1.5–6.5)
NEUT%: 51.9 % (ref 40.0–80.0)
Platelets: 211 10*3/uL (ref 145–400)
RBC: 3.75 10*6/uL — ABNORMAL LOW (ref 4.20–5.70)
RDW: 15.2 % (ref 11.1–15.7)
WBC: 7.1 10*3/uL (ref 4.0–10.0)

## 2014-09-01 ENCOUNTER — Telehealth: Payer: Self-pay | Admitting: Hematology & Oncology

## 2014-09-01 ENCOUNTER — Encounter (HOSPITAL_COMMUNITY): Payer: Self-pay

## 2014-09-01 ENCOUNTER — Ambulatory Visit (HOSPITAL_BASED_OUTPATIENT_CLINIC_OR_DEPARTMENT_OTHER): Payer: Medicare Other

## 2014-09-01 ENCOUNTER — Other Ambulatory Visit: Payer: Medicare Other

## 2014-09-01 VITALS — BP 134/67 | HR 56 | Temp 97.7°F | Resp 18

## 2014-09-01 DIAGNOSIS — C9000 Multiple myeloma not having achieved remission: Secondary | ICD-10-CM

## 2014-09-01 DIAGNOSIS — G4733 Obstructive sleep apnea (adult) (pediatric): Secondary | ICD-10-CM | POA: Diagnosis not present

## 2014-09-01 DIAGNOSIS — Z5112 Encounter for antineoplastic immunotherapy: Secondary | ICD-10-CM | POA: Diagnosis not present

## 2014-09-01 MED ORDER — ZOLEDRONIC ACID 4 MG/100ML IV SOLN
4.0000 mg | Freq: Once | INTRAVENOUS | Status: AC
Start: 2014-09-01 — End: 2014-09-01
  Administered 2014-09-01: 4 mg via INTRAVENOUS
  Filled 2014-09-01: qty 100

## 2014-09-01 MED ORDER — BORTEZOMIB CHEMO SQ INJECTION 3.5 MG (2.5MG/ML)
1.3000 mg/m2 | Freq: Once | INTRAMUSCULAR | Status: AC
  Administered 2014-09-01: 2.75 mg via SUBCUTANEOUS
  Filled 2014-09-01: qty 2.75

## 2014-09-01 MED ORDER — ONDANSETRON HCL 8 MG PO TABS
8.0000 mg | ORAL_TABLET | Freq: Once | ORAL | Status: AC
  Administered 2014-09-01: 8 mg via ORAL

## 2014-09-01 MED ORDER — ONDANSETRON HCL 8 MG PO TABS
ORAL_TABLET | ORAL | Status: AC
  Filled 2014-09-01: qty 1

## 2014-09-01 MED ORDER — SODIUM CHLORIDE 0.9 % IV SOLN
Freq: Once | INTRAVENOUS | Status: AC
  Administered 2014-09-01: 11:00:00 via INTRAVENOUS

## 2014-09-01 NOTE — Telephone Encounter (Signed)
Pt has been approved for reimbursement for cost sharing associated with specific, documented out-of-pocket medication expenses related to MM.  Valid: 08/28/2014 - 08/27/2015 Grant: $12,500 PRN: [id number]        COPY SCANNED

## 2014-09-01 NOTE — Patient Instructions (Signed)

## 2014-09-02 DIAGNOSIS — I200 Unstable angina: Secondary | ICD-10-CM | POA: Diagnosis not present

## 2014-09-02 DIAGNOSIS — Z955 Presence of coronary angioplasty implant and graft: Secondary | ICD-10-CM | POA: Diagnosis not present

## 2014-09-02 DIAGNOSIS — G4733 Obstructive sleep apnea (adult) (pediatric): Secondary | ICD-10-CM | POA: Diagnosis not present

## 2014-09-02 DIAGNOSIS — R918 Other nonspecific abnormal finding of lung field: Secondary | ICD-10-CM | POA: Diagnosis not present

## 2014-09-02 DIAGNOSIS — C9000 Multiple myeloma not having achieved remission: Secondary | ICD-10-CM | POA: Diagnosis not present

## 2014-09-08 ENCOUNTER — Other Ambulatory Visit: Payer: Self-pay | Admitting: Emergency Medicine

## 2014-09-08 ENCOUNTER — Ambulatory Visit (HOSPITAL_BASED_OUTPATIENT_CLINIC_OR_DEPARTMENT_OTHER): Payer: Medicare Other

## 2014-09-08 ENCOUNTER — Other Ambulatory Visit (HOSPITAL_BASED_OUTPATIENT_CLINIC_OR_DEPARTMENT_OTHER): Payer: Medicare Other

## 2014-09-08 VITALS — BP 119/60 | HR 70 | Temp 97.8°F | Resp 18

## 2014-09-08 DIAGNOSIS — C9000 Multiple myeloma not having achieved remission: Secondary | ICD-10-CM

## 2014-09-08 DIAGNOSIS — Z5112 Encounter for antineoplastic immunotherapy: Secondary | ICD-10-CM | POA: Diagnosis not present

## 2014-09-08 LAB — CMP (CANCER CENTER ONLY)
ALT(SGPT): 44 U/L (ref 10–47)
AST: 30 U/L (ref 11–38)
Albumin: 3.1 g/dL — ABNORMAL LOW (ref 3.3–5.5)
Alkaline Phosphatase: 114 U/L — ABNORMAL HIGH (ref 26–84)
BUN, Bld: 17 mg/dL (ref 7–22)
CHLORIDE: 101 meq/L (ref 98–108)
CO2: 26 mEq/L (ref 18–33)
Calcium: 9.3 mg/dL (ref 8.0–10.3)
Creat: 1.2 mg/dl (ref 0.6–1.2)
GLUCOSE: 119 mg/dL — AB (ref 73–118)
POTASSIUM: 3.4 meq/L (ref 3.3–4.7)
Sodium: 139 mEq/L (ref 128–145)
TOTAL PROTEIN: 7.9 g/dL (ref 6.4–8.1)
Total Bilirubin: 1 mg/dl (ref 0.20–1.60)

## 2014-09-08 LAB — CBC WITH DIFFERENTIAL (CANCER CENTER ONLY)
BASO#: 0 10*3/uL (ref 0.0–0.2)
BASO%: 0.1 % (ref 0.0–2.0)
EOS ABS: 0.2 10*3/uL (ref 0.0–0.5)
EOS%: 2 % (ref 0.0–7.0)
HEMATOCRIT: 31.6 % — AB (ref 38.7–49.9)
HGB: 10.6 g/dL — ABNORMAL LOW (ref 13.0–17.1)
LYMPH#: 1.6 10*3/uL (ref 0.9–3.3)
LYMPH%: 21.6 % (ref 14.0–48.0)
MCH: 29 pg (ref 28.0–33.4)
MCHC: 33.5 g/dL (ref 32.0–35.9)
MCV: 86 fL (ref 82–98)
MONO#: 0.5 10*3/uL (ref 0.1–0.9)
MONO%: 6.9 % (ref 0.0–13.0)
NEUT#: 5.2 10*3/uL (ref 1.5–6.5)
NEUT%: 69.4 % (ref 40.0–80.0)
PLATELETS: 185 10*3/uL (ref 145–400)
RBC: 3.66 10*6/uL — ABNORMAL LOW (ref 4.20–5.70)
RDW: 15.8 % — AB (ref 11.1–15.7)
WBC: 7.5 10*3/uL (ref 4.0–10.0)

## 2014-09-08 LAB — TECHNOLOGIST REVIEW CHCC SATELLITE

## 2014-09-08 MED ORDER — ONDANSETRON HCL 8 MG PO TABS
8.0000 mg | ORAL_TABLET | Freq: Once | ORAL | Status: AC
  Administered 2014-09-08: 8 mg via ORAL

## 2014-09-08 MED ORDER — BORTEZOMIB CHEMO SQ INJECTION 3.5 MG (2.5MG/ML)
1.3000 mg/m2 | Freq: Once | INTRAMUSCULAR | Status: AC
  Administered 2014-09-08: 2.75 mg via SUBCUTANEOUS
  Filled 2014-09-08: qty 2.75

## 2014-09-08 MED ORDER — ONDANSETRON HCL 8 MG PO TABS
ORAL_TABLET | ORAL | Status: AC
  Filled 2014-09-08: qty 1

## 2014-09-08 NOTE — Patient Instructions (Signed)
Bortezomib injection What is this medicine? BORTEZOMIB (bor TEZ oh mib) is a chemotherapy drug. It slows the growth of cancer cells. This medicine is used to treat multiple myeloma, and certain lymphomas, such as mantle-cell lymphoma. This medicine may be used for other purposes; ask your health care provider or pharmacist if you have questions. COMMON BRAND NAME(S): Velcade What should I tell my health care provider before I take this medicine? They need to know if you have any of these conditions: -diabetes -heart disease -irregular heartbeat -liver disease -on hemodialysis -low blood counts, like low white blood cells, platelets, or hemoglobin -peripheral neuropathy -taking medicine for blood pressure -an unusual or allergic reaction to bortezomib, mannitol, boron, other medicines, foods, dyes, or preservatives -pregnant or trying to get pregnant -breast-feeding How should I use this medicine? This medicine is for injection into a vein or for injection under the skin. It is given by a health care professional in a hospital or clinic setting. Talk to your pediatrician regarding the use of this medicine in children. Special care may be needed. Overdosage: If you think you have taken too much of this medicine contact a poison control center or emergency room at once. NOTE: This medicine is only for you. Do not share this medicine with others. What if I miss a dose? It is important not to miss your dose. Call your doctor or health care professional if you are unable to keep an appointment. What may interact with this medicine? This medicine may interact with the following medications: -ketoconazole -rifampin -ritonavir -St. John's Wort This list may not describe all possible interactions. Give your health care provider a list of all the medicines, herbs, non-prescription drugs, or dietary supplements you use. Also tell them if you smoke, drink alcohol, or use illegal drugs. Some items  may interact with your medicine. What should I watch for while using this medicine? Visit your doctor for checks on your progress. This drug may make you feel generally unwell. This is not uncommon, as chemotherapy can affect healthy cells as well as cancer cells. Report any side effects. Continue your course of treatment even though you feel ill unless your doctor tells you to stop. You may get drowsy or dizzy. Do not drive, use machinery, or do anything that needs mental alertness until you know how this medicine affects you. Do not stand or sit up quickly, especially if you are an older patient. This reduces the risk of dizzy or fainting spells. In some cases, you may be given additional medicines to help with side effects. Follow all directions for their use. Call your doctor or health care professional for advice if you get a fever, chills or sore throat, or other symptoms of a cold or flu. Do not treat yourself. This drug decreases your body's ability to fight infections. Try to avoid being around people who are sick. This medicine may increase your risk to bruise or bleed. Call your doctor or health care professional if you notice any unusual bleeding. You may need blood work done while you are taking this medicine. In some patients, this medicine may cause a serious brain infection that may cause death. If you have any problems seeing, thinking, speaking, walking, or standing, tell your doctor right away. If you cannot reach your doctor, urgently seek other source of medical care. Do not become pregnant while taking this medicine. Women should inform their doctor if they wish to become pregnant or think they might be pregnant. There is   a potential for serious side effects to an unborn child. Talk to your health care professional or pharmacist for more information. Do not breast-feed an infant while taking this medicine. Check with your doctor or health care professional if you get an attack of  severe diarrhea, nausea and vomiting, or if you sweat a lot. The loss of too much body fluid can make it dangerous for you to take this medicine. What side effects may I notice from receiving this medicine? Side effects that you should report to your doctor or health care professional as soon as possible: -allergic reactions like skin rash, itching or hives, swelling of the face, lips, or tongue -breathing problems -changes in hearing -changes in vision -fast, irregular heartbeat -feeling faint or lightheaded, falls -pain, tingling, numbness in the hands or feet -right upper belly pain -seizures -swelling of the ankles, feet, hands -unusual bleeding or bruising -unusually weak or tired -vomiting -yellowing of the eyes or skin Side effects that usually do not require medical attention (report to your doctor or health care professional if they continue or are bothersome): -changes in emotions or moods -constipation -diarrhea -loss of appetite -headache -irritation at site where injected -nausea This list may not describe all possible side effects. Call your doctor for medical advice about side effects. You may report side effects to FDA at 1-800-FDA-1088. Where should I keep my medicine? This drug is given in a hospital or clinic and will not be stored at home. NOTE: This sheet is a summary. It may not cover all possible information. If you have questions about this medicine, talk to your doctor, pharmacist, or health care provider.  2015, [employer]vier/Gold Standard. (2012-11-26 12:46:32)  

## 2014-09-12 ENCOUNTER — Ambulatory Visit (INDEPENDENT_AMBULATORY_CARE_PROVIDER_SITE_OTHER): Payer: Medicare Other | Admitting: Sports Medicine

## 2014-09-12 ENCOUNTER — Encounter: Payer: Self-pay | Admitting: Sports Medicine

## 2014-09-12 VITALS — BP 121/62 | HR 63 | Ht 72.0 in | Wt 201.0 lb

## 2014-09-12 DIAGNOSIS — G629 Polyneuropathy, unspecified: Secondary | ICD-10-CM | POA: Diagnosis not present

## 2014-09-12 DIAGNOSIS — I2510 Atherosclerotic heart disease of native coronary artery without angina pectoris: Secondary | ICD-10-CM

## 2014-09-12 MED ORDER — PREGABALIN 50 MG PO CAPS: ORAL_CAPSULE | ORAL | Status: DC

## 2014-09-12 NOTE — Assessment & Plan Note (Signed)
Improved slightly with amitriptyline but this is somewhat intolerable. Discontinue amitriptyline, I am going to give him some samples of Lyrica. We can hold off on the nerve conduction study for now, he has started chemotherapy, and this neuropathy will likely worsen a bit.

## 2014-09-12 NOTE — Progress Notes (Signed)
  Subjective:    CC: follow-up  HPI: Peripheral neuropathy: [name] [name] has multiple myeloma and has recently started chemotherapy, he is doing extremely well with regards to his nausea, and has had a great deal of appetite in fact, he is eager to get to the gym downstairs. We did try amitriptyline at first, he tells me he did have a relatively good response at the full dose with resolution of his neuropathic-type symptoms however thinks he experienced some swelling so we dropped it back down to a half tab, he overall did okay but is wondering if there is another option.  Past medical history, Surgical history, Family history not pertinant except as noted below, Social history, Allergies, and medications have been entered into the medical record, reviewed, and no changes needed.   Review of Systems: No fevers, chills, night sweats, weight loss, chest pain, or shortness of breath.   Objective:    General: Well Developed, well nourished, and in no acute distress.  Neuro: Alert and oriented x3, extra-ocular muscles intact, sensation grossly intact.  HEENT: Normocephalic, atraumatic, pupils equal round reactive to light, neck supple, no masses, no lymphadenopathy, thyroid nonpalpable.  Skin: Warm and dry, no rashes. Cardiac: Regular rate and rhythm, no murmurs rubs or gallops, no lower extremity edema.  Respiratory: Clear to auscultation bilaterally. Not using accessory muscles, speaking in full sentences.  Impression and Recommendations:    I spent 40 minutes with this patient, greater than 50% was face-to-face time counseling regarding the above diagnoses

## 2014-09-18 ENCOUNTER — Other Ambulatory Visit: Payer: Self-pay | Admitting: *Deleted

## 2014-09-18 DIAGNOSIS — C9000 Multiple myeloma not having achieved remission: Secondary | ICD-10-CM

## 2014-09-18 MED ORDER — LENALIDOMIDE 25 MG PO CAPS: ORAL_CAPSULE | ORAL | Status: DC

## 2014-09-22 ENCOUNTER — Ambulatory Visit: Payer: Medicare Other

## 2014-09-22 ENCOUNTER — Ambulatory Visit: Payer: Medicare Other | Admitting: Hematology & Oncology

## 2014-09-22 ENCOUNTER — Other Ambulatory Visit: Payer: Medicare Other

## 2014-09-23 ENCOUNTER — Encounter: Payer: Self-pay | Admitting: Hematology & Oncology

## 2014-09-24 ENCOUNTER — Telehealth: Payer: Self-pay | Admitting: *Deleted

## 2014-09-24 DIAGNOSIS — C9000 Multiple myeloma not having achieved remission: Secondary | ICD-10-CM

## 2014-09-24 MED ORDER — BACLOFEN 10 MG PO TABS: 10.0000 mg | ORAL_TABLET | Freq: Three times a day (TID) | ORAL | Status: DC | PRN

## 2014-09-24 MED ORDER — FAMOTIDINE 20 MG PO TABS: 20.0000 mg | ORAL_TABLET | Freq: Two times a day (BID) | ORAL | Status: DC

## 2014-09-24 MED ORDER — MAGIC MOUTHWASH W/LIDOCAINE: 5.0000 mL | Freq: Four times a day (QID) | ORAL | Status: DC | PRN

## 2014-09-24 NOTE — Telephone Encounter (Signed)
Called patient in response to email sent below. When I spoke to patient he also mentioned hiccups starting last evening. He would like something to help these as well.   Spoke to Dr Marin Olp and he would like patient to start the following:  Pepcid 20mg  po BID Magic Mouthwash QID as prescribed Baclofen Q8h prn hiccups     Good Morning!  I developed a skin rash last night around my lower stomach, hip to hip and upper thighs after taking normal dosage of dexamethason with food. Took Benadryl last night and have slight improvement. Prior to this dosage I havealso had persistent sore throat and sore tongue for several days. Your advice please.

## 2014-09-28 ENCOUNTER — Emergency Department (HOSPITAL_BASED_OUTPATIENT_CLINIC_OR_DEPARTMENT_OTHER): Payer: Medicare Other

## 2014-09-28 ENCOUNTER — Encounter (HOSPITAL_BASED_OUTPATIENT_CLINIC_OR_DEPARTMENT_OTHER): Payer: Self-pay

## 2014-09-28 ENCOUNTER — Emergency Department (HOSPITAL_BASED_OUTPATIENT_CLINIC_OR_DEPARTMENT_OTHER)
Admission: EM | Admit: 2014-09-28 | Discharge: 2014-09-28 | Disposition: A | Discharge status: Home / Self Care | Payer: Medicare Other | Attending: Emergency Medicine | Admitting: Emergency Medicine

## 2014-09-28 DIAGNOSIS — I252 Old myocardial infarction: Secondary | ICD-10-CM | POA: Insufficient documentation

## 2014-09-28 DIAGNOSIS — J45909 Unspecified asthma, uncomplicated: Secondary | ICD-10-CM | POA: Diagnosis not present

## 2014-09-28 DIAGNOSIS — I824Z1 Acute embolism and thrombosis of unspecified deep veins of right distal lower extremity: Secondary | ICD-10-CM | POA: Diagnosis not present

## 2014-09-28 DIAGNOSIS — Z872 Personal history of diseases of the skin and subcutaneous tissue: Secondary | ICD-10-CM | POA: Insufficient documentation

## 2014-09-28 DIAGNOSIS — Z87891 Personal history of nicotine dependence: Secondary | ICD-10-CM | POA: Diagnosis not present

## 2014-09-28 DIAGNOSIS — Z8701 Personal history of pneumonia (recurrent): Secondary | ICD-10-CM | POA: Insufficient documentation

## 2014-09-28 DIAGNOSIS — I10 Essential (primary) hypertension: Secondary | ICD-10-CM | POA: Diagnosis not present

## 2014-09-28 DIAGNOSIS — I82401 Acute embolism and thrombosis of unspecified deep veins of right lower extremity: Secondary | ICD-10-CM

## 2014-09-28 DIAGNOSIS — Z86010 Personal history of colonic polyps: Secondary | ICD-10-CM | POA: Diagnosis not present

## 2014-09-28 DIAGNOSIS — M1990 Unspecified osteoarthritis, unspecified site: Secondary | ICD-10-CM | POA: Diagnosis not present

## 2014-09-28 DIAGNOSIS — G8929 Other chronic pain: Secondary | ICD-10-CM | POA: Diagnosis not present

## 2014-09-28 DIAGNOSIS — K219 Gastro-esophageal reflux disease without esophagitis: Secondary | ICD-10-CM | POA: Insufficient documentation

## 2014-09-28 DIAGNOSIS — I82441 Acute embolism and thrombosis of right tibial vein: Secondary | ICD-10-CM | POA: Diagnosis not present

## 2014-09-28 DIAGNOSIS — Z87438 Personal history of other diseases of male genital organs: Secondary | ICD-10-CM | POA: Diagnosis not present

## 2014-09-28 DIAGNOSIS — Z8579 Personal history of other malignant neoplasms of lymphoid, hematopoietic and related tissues: Secondary | ICD-10-CM | POA: Diagnosis not present

## 2014-09-28 DIAGNOSIS — E785 Hyperlipidemia, unspecified: Secondary | ICD-10-CM | POA: Diagnosis not present

## 2014-09-28 DIAGNOSIS — Z79899 Other long term (current) drug therapy: Secondary | ICD-10-CM | POA: Insufficient documentation

## 2014-09-28 DIAGNOSIS — M79604 Pain in right leg: Secondary | ICD-10-CM | POA: Diagnosis present

## 2014-09-28 DIAGNOSIS — Z7982 Long term (current) use of aspirin: Secondary | ICD-10-CM | POA: Insufficient documentation

## 2014-09-28 LAB — BASIC METABOLIC PANEL
Anion gap: 8 (ref 5–15)
BUN: 19 mg/dL (ref 6–20)
CHLORIDE: 103 mmol/L (ref 101–111)
CO2: 28 mmol/L (ref 22–32)
CREATININE: 1.43 mg/dL — AB (ref 0.61–1.24)
Calcium: 9 mg/dL (ref 8.9–10.3)
GFR calc Af Amer: 55 mL/min — ABNORMAL LOW (ref >60)
GFR calc non Af Amer: 47 mL/min — ABNORMAL LOW (ref >60)
Glucose, Bld: 108 mg/dL — ABNORMAL HIGH (ref 65–99)
Potassium: 3.5 mmol/L (ref 3.5–5.1)
SODIUM: 139 mmol/L (ref 135–145)

## 2014-09-28 LAB — CBC WITH DIFFERENTIAL/PLATELET
BASOS ABS: 0.1 10*3/uL (ref 0.0–0.1)
BASOS PCT: 1 % (ref 0–1)
Eosinophils Absolute: 0.2 10*3/uL (ref 0.0–0.7)
Eosinophils Relative: 3 % (ref 0–5)
HEMATOCRIT: 33.2 % — AB (ref 39.0–52.0)
Hemoglobin: 10.6 g/dL — ABNORMAL LOW (ref 13.0–17.0)
LYMPHS PCT: 28 % (ref 12–46)
Lymphs Abs: 1.6 10*3/uL (ref 0.7–4.0)
MCH: 28 pg (ref 26.0–34.0)
MCHC: 31.9 g/dL (ref 30.0–36.0)
MCV: 87.8 fL (ref 78.0–100.0)
MONOS PCT: 20 % — AB (ref 3–12)
Monocytes Absolute: 1.1 10*3/uL — ABNORMAL HIGH (ref 0.1–1.0)
Neutro Abs: 2.7 10*3/uL (ref 1.7–7.7)
Neutrophils Relative %: 48 % (ref 43–77)
Platelets: 209 10*3/uL (ref 150–400)
RBC: 3.78 MIL/uL — ABNORMAL LOW (ref 4.22–5.81)
RDW: 16.9 % — ABNORMAL HIGH (ref 11.5–15.5)
WBC: 5.6 10*3/uL (ref 4.0–10.5)

## 2014-09-28 LAB — PROTIME-INR
INR: 1.04 (ref 0.00–1.49)
Prothrombin Time: 13.8 seconds (ref 11.6–15.2)

## 2014-09-28 MED ORDER — ENOXAPARIN SODIUM 150 MG/ML ~~LOC~~ SOLN
1.5000 mg/kg | Freq: Once | SUBCUTANEOUS | Status: AC
  Administered 2014-09-28: 135 mg via SUBCUTANEOUS
  Filled 2014-09-28: qty 1

## 2014-09-28 NOTE — ED Notes (Addendum)
Pt reports right calf pain, edema since this morning. History of DVT. Pt currently receiving Effient anticoagulation. Pt currently followed by Oncology for Myeloma.

## 2014-09-28 NOTE — Discharge Instructions (Signed)
Call Dr. Marin Olp in the morning and tell him what is going on to discuss longterm anticoagulation and treatment therapy   Deep Vein Thrombosis A deep vein thrombosis (DVT) is a blood clot that develops in the deep, larger veins of the leg, arm, or pelvis. These are more dangerous than clots that might form in veins near the surface of the body. A DVT can lead to serious and even life-threatening complications if the clot breaks off and travels in the bloodstream to the lungs.  A DVT can damage the valves in your leg veins so that instead of flowing upward, the blood pools in the lower leg. This is called post-thrombotic syndrome, and it can result in pain, swelling, discoloration, and sores on the leg. CAUSES Usually, several things contribute to the formation of blood clots. Contributing factors include:  The flow of blood slows down.  The inside of the vein is damaged in some way.  You have a condition that makes blood clot more easily. RISK FACTORS Some people are more likely than others to develop blood clots. Risk factors include:   Smoking.  Being overweight (obese).  Sitting or lying still for a long time. This includes long-distance travel, paralysis, or recovery from an illness or surgery. Other factors that increase risk are:   Older age, especially over 67 years of age.  Having a family history of blood clots or if you have already had a blot clot.  Having major or lengthy surgery. This is especially true for surgery on the hip, knee, or belly (abdomen). Hip surgery is particularly high risk.  Having a long, thin tube (catheter) placed inside a vein during a medical procedure.  Breaking a hip or leg.  Having cancer or cancer treatment.  Pregnancy and childbirth.  Hormone changes make the blood clot more easily during pregnancy.  The fetus puts pressure on the veins of the pelvis.  There is a risk of injury to veins during delivery or a caesarean delivery. The risk  is highest just after childbirth.  Medicines containing the male hormone estrogen. This includes birth control pills and hormone replacement therapy.  Other circulation or heart problems.  SIGNS AND SYMPTOMS When a clot forms, it can either partially or totally block the blood flow in that vein. Symptoms of a DVT can include:  Swelling of the leg or arm, especially if one side is much worse.  Warmth and redness of the leg or arm, especially if one side is much worse.  Pain in an arm or leg. If the clot is in the leg, symptoms may be more noticeable or worse when standing or walking. The symptoms of a DVT that has traveled to the lungs (pulmonary embolism, PE) usually start suddenly and include:  Shortness of breath.  Coughing.  Coughing up blood or blood-tinged mucus.  Chest pain. The chest pain is often worse with deep breaths.  Rapid heartbeat. Anyone with these symptoms should get emergency medical treatment right away. Do not wait to see if the symptoms will go away. Call your local emergency services (911 in the [place] if you have these symptoms. Do not drive yourself to the hospital. DIAGNOSIS If a DVT is suspected, your health care provider will take a full medical history and perform a physical exam. Tests that also may be required include:  Blood tests, including studies of the clotting properties of the blood.  Ultrasound to see if you have clots in your legs or lungs.  X-rays to  show the flow of blood when dye is injected into the veins (venogram).  Studies of your lungs if you have any chest symptoms. PREVENTION  Exercise the legs regularly. Take a brisk 30-minute walk every day.  Maintain a weight that is appropriate for your height.  Avoid sitting or lying in bed for long periods of time without moving your legs.  Women, particularly those over the age of 62 years, should consider the risks and benefits of taking estrogen medicines, including birth control  pills.  Do not smoke, especially if you take estrogen medicines.  Long-distance travel can increase your risk of DVT. You should exercise your legs by walking or pumping the muscles every hour.  Many of the risk factors above relate to situations that exist with hospitalization, either for illness, injury, or elective surgery. Prevention may include medical and nonmedical measures.  Your health care provider will assess you for the need for venous thromboembolism prevention when you are admitted to the hospital. If you are having surgery, your surgeon will assess you the day of or day after surgery. TREATMENT Once identified, a DVT can be treated. It can also be prevented in some circumstances. Once you have had a DVT, you may be at increased risk for a DVT in the future. The most common treatment for DVT is blood-thinning (anticoagulant) medicine, which reduces the blood's tendency to clot. Anticoagulants can stop new blood clots from forming and stop old clots from growing. They cannot dissolve existing clots. Your body does this by itself over time. Anticoagulants can be given by mouth, through an IV tube, or by injection. Your health care provider will determine the best program for you. Other medicines or treatments that may be used are:  Heparin or related medicines (low molecular weight heparin) are often the first treatment for a blood clot. They act quickly. However, they cannot be taken orally and must be given either in shot form or by IV tube.  Heparin can cause a fall in a component of blood that stops bleeding and forms blood clots (platelets). You will be monitored with blood tests to be sure this does not occur.  Warfarin is an anticoagulant that can be swallowed. It takes a few days to start working, so usually heparin or related medicines are used in combination. Once warfarin is working, heparin is usually stopped.  Factor Xa inhibitor medicines, such as rivaroxaban and apixaban,  also reduce blood clotting. These medicines are taken orally and can often be used without heparin or related medicines.  Less commonly, clot dissolving drugs (thrombolytics) are used to dissolve a DVT. They carry a high risk of bleeding, so they are used mainly in severe cases where your life or a part of your body is threatened.  Very rarely, a blood clot in the leg needs to be removed surgically.  If you are unable to take anticoagulants, your health care provider may arrange for you to have a filter placed in a main vein in your abdomen. This filter prevents clots from traveling to your lungs. HOME CARE INSTRUCTIONS  Take all medicines as directed by your health care provider.  Learn as much as you can about DVT.  Wear a medical alert bracelet or carry a medical alert card.  Ask your health care provider how soon you can go back to normal activities. It is important to stay active to prevent blood clots. If you are on anticoagulant medicine, avoid contact sports.  It is very important to  exercise. This is especially important while traveling, sitting, or standing for long periods of time. Exercise your legs by walking or by tightening and relaxing your leg muscles regularly. Take frequent walks.  You may need to wear compression stockings. These are tight elastic stockings that apply pressure to the lower legs. This pressure can help keep the blood in the legs from clotting. Taking Warfarin Warfarin is a daily medicine that is taken by mouth. Your health care provider will advise you on the length of treatment (usually 3-6 months, sometimes lifelong). If you take warfarin:  Understand how to take warfarin and foods that can affect how warfarin works in Veterinary surgeon.  Too much and too little warfarin are both dangerous. Too much warfarin increases the risk of bleeding. Too little warfarin continues to allow the risk for blood clots. Warfarin and Regular Blood Testing While taking warfarin,  you will need to have regular blood tests to measure your blood clotting time. These blood tests usually include both the prothrombin time (PT) and international normalized ratio (INR) tests. The PT and INR results allow your health care provider to adjust your dose of warfarin. It is very important that you have your PT and INR tested as often as directed by your health care provider.  Warfarin and Your Diet Avoid major changes in your diet, or notify your health care provider before changing your diet. Arrange a visit with a registered dietitian to answer your questions. Many foods, especially foods high in vitamin K, can interfere with warfarin and affect the PT and INR results. You should eat a consistent amount of foods high in vitamin K. Foods high in vitamin K include:   Spinach, kale, broccoli, cabbage, collard and turnip greens, Brussels sprouts, peas, cauliflower, seaweed, and parsley.  Beef and pork liver.  Green tea.  Soybean oil. Warfarin with Other Medicines Many medicines can interfere with warfarin and affect the PT and INR results. You must:  Tell your health care provider about any and all medicines, vitamins, and supplements you take, including aspirin and other over-the-counter anti-inflammatory medicines. Be especially cautious with aspirin and anti-inflammatory medicines. Ask your health care provider before taking these.  Do not take or discontinue any prescribed or over-the-counter medicine except on the advice of your health care provider or pharmacist. Warfarin Side Effects Warfarin can have side effects, such as easy bruising and difficulty stopping bleeding. Ask your health care provider or pharmacist about other side effects of warfarin. You will need to:  Hold pressure over cuts for longer than usual.  Notify your dentist and other health care providers that you are taking warfarin before you undergo any procedures where bleeding may occur. Warfarin with Alcohol  and Tobacco   Drinking alcohol frequently can increase the effect of warfarin, leading to excess bleeding. It is best to avoid alcoholic drinks or to consume only very small amounts while taking warfarin. Notify your health care provider if you change your alcohol intake.   Do not use any tobacco products including cigarettes, chewing tobacco, or electronic cigarettes. If you smoke, quit. Ask your health care provider for help with quitting smoking. Alternative Medicines to Warfarin: Factor Xa Inhibitor Medicines  These blood-thinning medicines are taken by mouth, usually for several weeks or longer. It is important to take the medicine every single day at the same time each day.  There are no regular blood tests required when using these medicines.  There are fewer food and drug interactions than with warfarin.  The side effects of this class of medicine are similar to those of warfarin, including excessive bruising or bleeding. Ask your health care provider or pharmacist about other potential side effects. SEEK MEDICAL CARE IF:  You notice a rapid heartbeat.  You feel weaker or more tired than usual.  You feel faint.  You notice increased bruising.  You feel your symptoms are not getting better in the time expected.  You believe you are having side effects of medicine. SEEK IMMEDIATE MEDICAL CARE IF:  You have chest pain.  You have trouble breathing.  You have new or increased swelling or pain in one leg.  You cough up blood.  You notice blood in vomit, in a bowel movement, or in urine. MAKE SURE YOU:  Understand these instructions.  Will watch your condition.  Will get help right away if you are not doing well or get worse. Document Released: 01/31/2005 Document Revised: 06/17/2013 Document Reviewed: 10/08/2012 [facility] Patient Information 2015 [place], [place]. This information is not intended to replace advice given to you by your health care provider. Make sure you  discuss any questions you have with your health care provider.

## 2014-09-28 NOTE — ED Notes (Signed)
MD at bedside. 

## 2014-09-28 NOTE — ED Notes (Signed)
Pt in ultrasound at this time, alerted tech to bring pt to room 9

## 2014-09-28 NOTE — ED Provider Notes (Signed)
CSN: [id number]     Arrival date & time 09/28/14  1957 This chart was scribed for Leonard Schwartz, MD by Rayna Sexton, ED scribe. This patient was seen in room MH09/MH09 and the patient's care was started at 8:43 PM.   Chief Complaint  Patient presents with  . Leg Pain   The history is provided by the patient and the spouse. No language interpreter was used.    HPI Comments: [name] [name] is a 74 y.o. male, with a hx of DVT/PE and multiple myeloma, who presents to the Emergency Department complaining of worsening, moderate, right calf pain with onset this morning. He notes waking with bilateral calf pain, applied compression stockings, and notes he now has just the worsening right calf pain. Pt notes having had neuropathy since recently being dx with multiple myeloma. Pt's wife notes he has chemo treatment scheduled for tomorrow. Pt notes his PE was 2 years ago. Pt is currently taking Effient and notes having taken xarelto s/p his prior DVT/PE for 6 months. He denies any other associated symptoms.   Past Medical History  Diagnosis Date  . Myocardial infarction 2010  . Complication of anesthesia     severe case of hiccups after hernia surgery;required medication  . Hyperlipidemia     takes Zocor daily  . Hypertension     takes HCTZ,Diovan daily and Isosorbide daily  . Asthma     uses Advair bid and asmanex prn  . Emphysema   . Pneumonia     hx of---12+yrs ago  . Chronic bronchitis     couple of yrs ago  . Dizziness     related to neck issues  . Arthritis     back  . Joint pain   . Chronic back pain     buldging disc and scoliosis  . Neck pain   . Itching     down left arm and leg  . GERD (gastroesophageal reflux disease)     takes PRotonix daily  . History of colon polyps   . Diverticulosis   . Enlarged prostate     takes Flomax daily  . Macular degeneration     mild,beginning  . Pain in limb 06/20/2012  . Cancer     multiple myeloma  . Multiple myeloma 08/13/2014  .  Acute deep vein thrombosis (DVT) of tibial vein of right lower extremity 09/29/2014   Past Surgical History  Procedure Laterality Date  . Hernia repair  2006    x 3   . Salivary gland removed      left   . Vein removed from left leg    . Colonoscopy    . Skin cancer removed from left side of face      basal cell carcinoma  . Cardiac catheterization  2010/2013    [facility]/07/11 [facility]) LAD 25%, D2 75% (small), pLCx 100%, RCA 25%--medical managment   . Anterior cervical decomp/discectomy fusion  01/31/2012    Procedure: ANTERIOR CERVICAL DECOMPRESSION/DISCECTOMY FUSION 1 LEVEL;  Surgeon: Floyce Stakes, MD;  Location: [facility]UR[facility];  Service: Neurosurgery;  Laterality: N/A;  Cervical Five-Six  Anterior Cervical Decompression /Diskectomy /Fusion/Plate   Family History  Problem Relation Age of Onset  . Cancer Sister   . Cancer Sister   . Hypertension Mother   . Heart attack Father    Social History  Substance Use Topics  . Smoking status: Former Smoker -- 30 years    Types: Pipe    Quit date: 11/24/2011  .  Smokeless tobacco: Former Systems developer    Quit date: 02/15/2011     Comment: quit tobacco 3 years ago  . Alcohol Use: 0.0 oz/week    0 Standard drinks or equivalent per week     Comment: 1-2 glasses of wine daily    Review of Systems  Musculoskeletal: Positive for myalgias.  All other systems reviewed and are negative.  Allergies  Hydrocodone-acetaminophen; Other; Codeine; and Mushroom extract complex  Home Medications   Prior to Admission medications   Medication Sig Start Date End Date Taking? Authorizing Provider  Alum & Mag Hydroxide-Simeth (MAGIC MOUTHWASH W/LIDOCAINE) SOLN Take 5 mLs by mouth 4 (four) times daily as needed for mouth pain. 09/24/14  Yes [name], MD  amLODipine (NORVASC) 5 MG tablet Take 1 tablet (5 mg total) by mouth daily. 08/15/14  Yes Silverio Decamp, MD  aspirin 81 MG tablet Take 81 mg by mouth daily.   Yes Historical Provider, MD  atorvastatin  (LIPITOR) 20 MG tablet Take 1 tablet (20 mg total) by mouth daily. 08/15/14  Yes Silverio Decamp, MD  baclofen (LIORESAL) 10 MG tablet Take 1 tablet (10 mg total) by mouth every 8 (eight) hours as needed for muscle spasms (hiccups). 09/24/14  Yes [name], MD  carvedilol (COREG) 6.25 MG tablet Take 1 tablet (6.25 mg total) by mouth 2 (two) times daily with a meal. 08/15/14  Yes Silverio Decamp, MD  clobetasol (TEMOVATE) 0.05 % external solution Apply 1 application topically 2 (two) times daily. For 2 weeks at a time   Yes Historical Provider, MD  dexamethasone (DECADRON) 4 MG tablet Take 5 pills, with food, once a week. 08/19/14  Yes [name], MD  esomeprazole (NEXIUM) 40 MG capsule Take 40 mg by mouth daily at 12 noon.   Yes Historical Provider, MD  famciclovir (FAMVIR) 500 MG tablet Take 1 tablet (500 mg total) by mouth daily. 08/19/14  Yes [name], MD  famotidine (PEPCID) 20 MG tablet Take 1 tablet (20 mg total) by mouth 2 (two) times daily. 09/24/14  Yes [name], MD  hydrochlorothiazide (MICROZIDE) 12.5 MG capsule Take 1 capsule (12.5 mg total) by mouth daily. 08/15/14  Yes Silverio Decamp, MD  isosorbide mononitrate (IMDUR) 30 MG 24 hr tablet Take 1 tablet (30 mg total) by mouth 2 (two) times daily. Patient taking differently: Take 30 mg by mouth 2 (two) times daily. PATIENT TAKES 60 MG DAILY 08/15/14  Yes Silverio Decamp, MD  lenalidomide (REVLIMID) 25 MG capsule Take 1 pill a day at bedtime for 21 days and then stop for 7 days. [id number] 09/18/14  Yes [name], MD  LORazepam (ATIVAN) 1 MG tablet Take 1.5 tablets (1.5 mg total) by mouth at bedtime. 07/21/14  Yes Silverio Decamp, MD  montelukast (SINGULAIR) 10 MG tablet Take 1 tablet (10 mg total) by mouth at bedtime. 08/15/14  Yes Silverio Decamp, MD  nitroGLYCERIN (NITROSTAT) 0.4 MG SL tablet Place 0.4 mg under the tongue every 5 (five) minutes as needed. For chest pain   Yes Historical  Provider, MD  ondansetron (ZOFRAN) 8 MG tablet Take 1 tablet (8 mg total) by mouth 2 (two) times daily. Start the day after chemo for 2 days. Then take as needed for nausea or vomiting. 08/25/14  Yes [name], MD  pregabalin (LYRICA) 50 MG capsule 1 tab by mouth daily at bedtime for 1 week, then twice a day for a week, then 3  times a day Patient not taking: Reported on 09/29/2014 09/12/14  Yes Silverio Decamp, MD  prochlorperazine (COMPAZINE) 10 MG tablet Take 1 tablet (10 mg total) by mouth every 6 (six) hours as needed (Nausea or vomiting). 08/25/14  Yes [name], MD  simvastatin (ZOCOR) 20 MG tablet Take 1 tablet (20 mg total) by mouth every evening. 02/19/13  Yes Silverio Decamp, MD  traMADol (ULTRAM) 50 MG tablet Take 1 tablet (50 mg total) by mouth every 6 (six) hours as needed for moderate pain. 08/19/14  Yes [name], MD  clindamycin (CLEOCIN T) 1 % lotion Apply 1 application topically 2 (two) times daily.    Historical Provider, MD  clopidogrel (PLAVIX) 75 MG tablet Take 1 tablet (75 mg total) by mouth daily. 09/29/14   [name], MD  nystatin (MYCOSTATIN) 100000 UNIT/ML suspension  09/24/14   Historical Provider, MD  Rivaroxaban (XARELTO STARTER PACK) 15 & 20 MG TBPK Take as directed on package: Start with one $Remove'15mg'LbNojOb$  tablet by mouth twice a day with food. On Day 22, switch to one $Remo'20mg'lyWZa$  tablet once a day with food. 09/29/14   [name], MD   BP 132/73 mmHg  Pulse 63  Temp(Src) 98.1 F (36.7 C) (Oral)  Resp 20  Ht 6' (1.829 m)  Wt 200 lb (90.719 kg)  BMI 27.12 kg/m2  SpO2 91% Physical Exam  Constitutional: He is oriented to person, place, and time. He appears well-developed and well-nourished. No distress.  HENT:  Head: Normocephalic and atraumatic.  Eyes: Pupils are equal, round, and reactive to light.  Neck: Normal range of motion.  Cardiovascular: Normal rate and intact distal pulses.   Pulmonary/Chest: No respiratory distress.  Abdominal: Normal  appearance. He exhibits no distension.  Musculoskeletal: Normal range of motion.       Right lower leg: He exhibits no tenderness, no swelling, no edema and no deformity.  Neurological: He is alert and oriented to person, place, and time. No cranial nerve deficit.  Skin: Skin is warm and dry. No rash noted.  Psychiatric: He has a normal mood and affect. His behavior is normal.  Nursing note and vitals reviewed.   ED Course  Procedures  DIAGNOSTIC STUDIES: Oxygen Saturation is 96% on RA, normal by my interpretation.    COORDINATION OF CARE: 9:23 PM Discussed treatment plan with pt at bedside and pt agreed to plan.  Labs Review Labs Reviewed  BASIC METABOLIC PANEL - Abnormal; Notable for the following:    Glucose, Bld 108 (*)    Creatinine, Ser 1.43 (*)    GFR calc non Af Amer 47 (*)    GFR calc Af Amer 55 (*)    All other components within normal limits  CBC WITH DIFFERENTIAL/PLATELET - Abnormal; Notable for the following:    RBC 3.78 (*)    Hemoglobin 10.6 (*)    HCT 33.2 (*)    RDW 16.9 (*)    Monocytes Relative 20 (*)    Monocytes Absolute 1.1 (*)    All other components within normal limits  PROTIME-INR    Imaging Review No results found. I, Leonard Schwartz, MD, personally reviewed and evaluated these images and lab results as part of my medical decision-making.  I discussed with the oncologist on call  Will give Lovenox  And have him f/u with Dr Jonette Eva tomorrow  Patient is in agreement with treatment plan    MDM   Final diagnoses:  DVT (deep venous thrombosis), right  I personally performed the services described in this documentation, which was scribed in my presence. The recorded information has been reviewed and considered.     Leonard Schwartz, MD 10/02/14 [phone]

## 2014-09-29 ENCOUNTER — Encounter: Payer: Self-pay | Admitting: Hematology & Oncology

## 2014-09-29 ENCOUNTER — Ambulatory Visit (HOSPITAL_BASED_OUTPATIENT_CLINIC_OR_DEPARTMENT_OTHER): Payer: Medicare Other | Admitting: Hematology & Oncology

## 2014-09-29 ENCOUNTER — Ambulatory Visit (HOSPITAL_BASED_OUTPATIENT_CLINIC_OR_DEPARTMENT_OTHER): Payer: Medicare Other

## 2014-09-29 ENCOUNTER — Other Ambulatory Visit (HOSPITAL_BASED_OUTPATIENT_CLINIC_OR_DEPARTMENT_OTHER): Payer: Medicare Other

## 2014-09-29 VITALS — BP 122/58 | HR 61 | Temp 97.4°F | Ht 72.0 in | Wt 207.0 lb

## 2014-09-29 DIAGNOSIS — I82441 Acute embolism and thrombosis of right tibial vein: Secondary | ICD-10-CM

## 2014-09-29 DIAGNOSIS — I82401 Acute embolism and thrombosis of unspecified deep veins of right lower extremity: Secondary | ICD-10-CM

## 2014-09-29 DIAGNOSIS — C9000 Multiple myeloma not having achieved remission: Secondary | ICD-10-CM | POA: Diagnosis not present

## 2014-09-29 DIAGNOSIS — Z5111 Encounter for antineoplastic chemotherapy: Secondary | ICD-10-CM

## 2014-09-29 DIAGNOSIS — I2510 Atherosclerotic heart disease of native coronary artery without angina pectoris: Secondary | ICD-10-CM

## 2014-09-29 HISTORY — DX: Acute embolism and thrombosis of right tibial vein: I82.441

## 2014-09-29 LAB — CBC WITH DIFFERENTIAL (CANCER CENTER ONLY)
BASO#: 0.1 10*3/uL (ref 0.0–0.2)
BASO%: 1.3 % (ref 0.0–2.0)
EOS%: 2.6 % (ref 0.0–7.0)
Eosinophils Absolute: 0.1 10*3/uL (ref 0.0–0.5)
HEMATOCRIT: 32.7 % — AB (ref 38.7–49.9)
HGB: 10.6 g/dL — ABNORMAL LOW (ref 13.0–17.1)
LYMPH#: 1.8 10*3/uL (ref 0.9–3.3)
LYMPH%: 33.5 % (ref 14.0–48.0)
MCH: 28.9 pg (ref 28.0–33.4)
MCHC: 32.4 g/dL (ref 32.0–35.9)
MCV: 89 fL (ref 82–98)
MONO#: 1 10*3/uL — ABNORMAL HIGH (ref 0.1–0.9)
MONO%: 19.5 % — AB (ref 0.0–13.0)
NEUT#: 2.3 10*3/uL (ref 1.5–6.5)
NEUT%: 43.1 % (ref 40.0–80.0)
PLATELETS: 209 10*3/uL (ref 145–400)
RBC: 3.67 10*6/uL — ABNORMAL LOW (ref 4.20–5.70)
RDW: 17 % — AB (ref 11.1–15.7)
WBC: 5.3 10*3/uL (ref 4.0–10.0)

## 2014-09-29 LAB — CMP (CANCER CENTER ONLY)
ALK PHOS: 123 U/L — AB (ref 26–84)
ALT: 37 U/L (ref 10–47)
AST: 22 U/L (ref 11–38)
Albumin: 3.1 g/dL — ABNORMAL LOW (ref 3.3–5.5)
BUN, Bld: 18 mg/dL (ref 7–22)
CALCIUM: 9.2 mg/dL (ref 8.0–10.3)
CHLORIDE: 106 meq/L (ref 98–108)
CO2: 28 mEq/L (ref 18–33)
Creat: 1.3 mg/dl — ABNORMAL HIGH (ref 0.6–1.2)
GLUCOSE: 148 mg/dL — AB (ref 73–118)
POTASSIUM: 3.5 meq/L (ref 3.3–4.7)
Sodium: 143 mEq/L (ref 128–145)
Total Bilirubin: 0.8 mg/dl (ref 0.20–1.60)
Total Protein: 7.2 g/dL (ref 6.4–8.1)

## 2014-09-29 MED ORDER — ZOLEDRONIC ACID 4 MG/100ML IV SOLN
4.0000 mg | Freq: Once | INTRAVENOUS | Status: AC
  Administered 2014-09-29: 4 mg via INTRAVENOUS
  Filled 2014-09-29: qty 100

## 2014-09-29 MED ORDER — BORTEZOMIB CHEMO SQ INJECTION 3.5 MG (2.5MG/ML)
1.3000 mg/m2 | Freq: Once | INTRAMUSCULAR | Status: AC
  Administered 2014-09-29: 2.75 mg via SUBCUTANEOUS
  Filled 2014-09-29: qty 2.75

## 2014-09-29 MED ORDER — ONDANSETRON HCL 8 MG PO TABS
8.0000 mg | ORAL_TABLET | Freq: Once | ORAL | Status: AC
  Administered 2014-09-29: 8 mg via ORAL

## 2014-09-29 MED ORDER — SODIUM CHLORIDE 0.9 % IV SOLN
Freq: Once | INTRAVENOUS | Status: AC
  Administered 2014-09-29: 09:00:00 via INTRAVENOUS

## 2014-09-29 MED ORDER — ONDANSETRON HCL 8 MG PO TABS
ORAL_TABLET | ORAL | Status: AC
  Filled 2014-09-29: qty 1

## 2014-09-29 MED ORDER — CLOPIDOGREL BISULFATE 75 MG PO TABS: 75.0000 mg | ORAL_TABLET | Freq: Every day | ORAL | Status: DC

## 2014-09-29 MED ORDER — RIVAROXABAN (XARELTO) VTE STARTER PACK (15 & 20 MG): ORAL_TABLET | ORAL | Status: DC

## 2014-09-29 NOTE — Addendum Note (Signed)
Addended by: Burney Gauze R on: 09/29/2014 04:46 PM   Modules accepted: Orders, Medications

## 2014-09-29 NOTE — Patient Instructions (Signed)
Zoledronic Acid injection (Hypercalcemia, Oncology) What is this medicine? ZOLEDRONIC ACID (ZOE le dron ik AS id) lowers the amount of calcium loss from bone. It is used to treat too much calcium in your blood from cancer. It is also used to prevent complications of cancer that has spread to the bone. This medicine may be used for other purposes; ask your health care provider or pharmacist if you have questions. COMMON BRAND NAME(S): Zometa What should I tell my health care provider before I take this medicine? They need to know if you have any of these conditions: -aspirin-sensitive asthma -cancer, especially if you are receiving medicines used to treat cancer -dental disease or wear dentures -infection -kidney disease -receiving corticosteroids like dexamethasone or prednisone -an unusual or allergic reaction to zoledronic acid, other medicines, foods, dyes, or preservatives -pregnant or trying to get pregnant -breast-feeding How should I use this medicine? This medicine is for infusion into a vein. It is given by a health care professional in a hospital or clinic setting. Talk to your pediatrician regarding the use of this medicine in children. Special care may be needed. Overdosage: If you think you have taken too much of this medicine contact a poison control center or emergency room at once. NOTE: This medicine is only for you. Do not share this medicine with others. What if I miss a dose? It is important not to miss your dose. Call your doctor or health care professional if you are unable to keep an appointment. What may interact with this medicine? -certain antibiotics given by injection -NSAIDs, medicines for pain and inflammation, like ibuprofen or naproxen -some diuretics like bumetanide, furosemide -teriparatide -thalidomide This list may not describe all possible interactions. Give your health care provider a list of all the medicines, herbs, non-prescription drugs, or  dietary supplements you use. Also tell them if you smoke, drink alcohol, or use illegal drugs. Some items may interact with your medicine. What should I watch for while using this medicine? Visit your doctor or health care professional for regular checkups. It may be some time before you see the benefit from this medicine. Do not stop taking your medicine unless your doctor tells you to. Your doctor may order blood tests or other tests to see how you are doing. Women should inform their doctor if they wish to become pregnant or think they might be pregnant. There is a potential for serious side effects to an unborn child. Talk to your health care professional or pharmacist for more information. You should make sure that you get enough calcium and vitamin D while you are taking this medicine. Discuss the foods you eat and the vitamins you take with your health care professional. Some people who take this medicine have severe bone, joint, and/or muscle pain. This medicine may also increase your risk for jaw problems or a broken thigh bone. Tell your doctor right away if you have severe pain in your jaw, bones, joints, or muscles. Tell your doctor if you have any pain that does not go away or that gets worse. Tell your dentist and dental surgeon that you are taking this medicine. You should not have major dental surgery while on this medicine. See your dentist to have a dental exam and fix any dental problems before starting this medicine. Take good care of your teeth while on this medicine. Make sure you see your dentist for regular follow-up appointments. What side effects may I notice from receiving this medicine? Side effects that   you should report to your doctor or health care professional as soon as possible: -allergic reactions like skin rash, itching or hives, swelling of the face, lips, or tongue -anxiety, confusion, or depression -breathing problems -changes in vision -eye pain -feeling faint or  lightheaded, falls -jaw pain, especially after dental work -mouth sores -muscle cramps, stiffness, or weakness -trouble passing urine or change in the amount of urine Side effects that usually do not require medical attention (report to your doctor or health care professional if they continue or are bothersome): -bone, joint, or muscle pain -constipation -diarrhea -fever -hair loss -irritation at site where injected -loss of appetite -nausea, vomiting -stomach upset -trouble sleeping -trouble swallowing -weak or tired This list may not describe all possible side effects. Call your doctor for medical advice about side effects. You may report side effects to FDA at 1-800-FDA-1088. Where should I keep my medicine? This drug is given in a hospital or clinic and will not be stored at home. NOTE: This sheet is a summary. It may not cover all possible information. If you have questions about this medicine, talk to your doctor, pharmacist, or health care provider.  2015, Elsevier/Gold Standard. (2012-07-12 13:03:13)  Bortezomib injection What is this medicine? BORTEZOMIB (bor TEZ oh mib) is a chemotherapy drug. It slows the growth of cancer cells. This medicine is used to treat multiple myeloma, and certain lymphomas, such as mantle-cell lymphoma. This medicine may be used for other purposes; ask your health care provider or pharmacist if you have questions. COMMON BRAND NAME(S): Velcade What should I tell my health care provider before I take this medicine? They need to know if you have any of these conditions: -diabetes -heart disease -irregular heartbeat -liver disease -on hemodialysis -low blood counts, like low white blood cells, platelets, or hemoglobin -peripheral neuropathy -taking medicine for blood pressure -an unusual or allergic reaction to bortezomib, mannitol, boron, other medicines, foods, dyes, or preservatives -pregnant or trying to get pregnant -breast-feeding How  should I use this medicine? This medicine is for injection into a vein or for injection under the skin. It is given by a health care professional in a hospital or clinic setting. Talk to your pediatrician regarding the use of this medicine in children. Special care may be needed. Overdosage: If you think you have taken too much of this medicine contact a poison control center or emergency room at once. NOTE: This medicine is only for you. Do not share this medicine with others. What if I miss a dose? It is important not to miss your dose. Call your doctor or health care professional if you are unable to keep an appointment. What may interact with this medicine? This medicine may interact with the following medications: -ketoconazole -rifampin -ritonavir -St. John's Wort This list may not describe all possible interactions. Give your health care provider a list of all the medicines, herbs, non-prescription drugs, or dietary supplements you use. Also tell them if you smoke, drink alcohol, or use illegal drugs. Some items may interact with your medicine. What should I watch for while using this medicine? Visit your doctor for checks on your progress. This drug may make you feel generally unwell. This is not uncommon, as chemotherapy can affect healthy cells as well as cancer cells. Report any side effects. Continue your course of treatment even though you feel ill unless your doctor tells you to stop. You may get drowsy or dizzy. Do not drive, use machinery, or do anything that needs mental   alertness until you know how this medicine affects you. Do not stand or sit up quickly, especially if you are an older patient. This reduces the risk of dizzy or fainting spells. In some cases, you may be given additional medicines to help with side effects. Follow all directions for their use. Call your doctor or health care professional for advice if you get a fever, chills or sore throat, or other symptoms of a  cold or flu. Do not treat yourself. This drug decreases your body's ability to fight infections. Try to avoid being around people who are sick. This medicine may increase your risk to bruise or bleed. Call your doctor or health care professional if you notice any unusual bleeding. You may need blood work done while you are taking this medicine. In some patients, this medicine may cause a serious brain infection that may cause death. If you have any problems seeing, thinking, speaking, walking, or standing, tell your doctor right away. If you cannot reach your doctor, urgently seek other source of medical care. Do not become pregnant while taking this medicine. Women should inform their doctor if they wish to become pregnant or think they might be pregnant. There is a potential for serious side effects to an unborn child. Talk to your health care professional or pharmacist for more information. Do not breast-feed an infant while taking this medicine. Check with your doctor or health care professional if you get an attack of severe diarrhea, nausea and vomiting, or if you sweat a lot. The loss of too much body fluid can make it dangerous for you to take this medicine. What side effects may I notice from receiving this medicine? Side effects that you should report to your doctor or health care professional as soon as possible: -allergic reactions like skin rash, itching or hives, swelling of the face, lips, or tongue -breathing problems -changes in hearing -changes in vision -fast, irregular heartbeat -feeling faint or lightheaded, falls -pain, tingling, numbness in the hands or feet -right upper belly pain -seizures -swelling of the ankles, feet, hands -unusual bleeding or bruising -unusually weak or tired -vomiting -yellowing of the eyes or skin Side effects that usually do not require medical attention (report to your doctor or health care professional if they continue or are  bothersome): -changes in emotions or moods -constipation -diarrhea -loss of appetite -headache -irritation at site where injected -nausea This list may not describe all possible side effects. Call your doctor for medical advice about side effects. You may report side effects to FDA at 1-800-FDA-1088. Where should I keep my medicine? This drug is given in a hospital or clinic and will not be stored at home. NOTE: This sheet is a summary. It may not cover all possible information. If you have questions about this medicine, talk to your doctor, pharmacist, or health care provider.  2015, Elsevier/Gold Standard. (2012-11-26 12:46:32)   

## 2014-09-29 NOTE — Progress Notes (Signed)
Hematology and Oncology Follow Up Visit  [name] [name] [id number] [birth year] 74 y.o. 09/29/2014   Principle Diagnosis:   IgG kappa myeloma  Acute thromboembolism of the right lower leg  Current Therapy:    Patient  Is s/p cycle #1 of Velcade/Revlimid/Decadron  Zometa 4 mg IV every month  Xarelto-started today     Interim History:  [name] [name] is back for follow-up. Shockingly enough, he was in the emergency room yesterday. Has swelling of the right leg. Of note, he was on aspirin. He was on baby aspirin. He also was on Effient because of a cardiac stent. He be on Effient for about 6 months.  He had Doppler test done. The Doppler showed that he had an acute occlusive thrombus in the right anterior tibial vein.  She's given some Lovenox in the emergency room.  He is on Effient because of a cardiac stent. I will have to speak to his cardiologist about this.  He needs to be on formal anticoagulation. I think Xarelto would be appropriate for him. Again, I want him off the Effient. I would think that it would be okay for him to be off Effient. I think that he pontes be on aspirin at 162 mg. I will have speak to his cardiologist.  He started his first cycle of chemotherapy 3 weeks ago. He seemed to tolerate this pretty well. He had little bit of a rash. It little bit of constipation.  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  He has had no cough. He had a little bit of a cough yesterday.  He had no nausea or vomiting.  His right leg was swollen. This has gotten a little bit better.  Overall, his performance status is ECOG 1.    Medications:  Current outpatient prescriptions:  .  Alum & Mag Hydroxide-Simeth (MAGIC MOUTHWASH W/LIDOCAINE) SOLN, Take 5 mLs by mouth 4 (four) times daily as needed for mouth pain., Disp: 600 mL, Rfl: 3 .  amLODipine (NORVASC) 5 MG tablet, Take 1 tablet (5 mg total) by mouth daily., Disp: 90 tablet, Rfl: 3 .   aspirin 81 MG tablet, Take 81 mg by mouth daily., Disp: , Rfl:  .  atorvastatin (LIPITOR) 20 MG tablet, Take 1 tablet (20 mg total) by mouth daily., Disp: 90 tablet, Rfl: 3 .  baclofen (LIORESAL) 10 MG tablet, Take 1 tablet (10 mg total) by mouth every 8 (eight) hours as needed for muscle spasms (hiccups)., Disp: 60 each, Rfl: 3 .  carvedilol (COREG) 6.25 MG tablet, Take 1 tablet (6.25 mg total) by mouth 2 (two) times daily with a meal., Disp: 180 tablet, Rfl: 3 .  clindamycin (CLEOCIN T) 1 % lotion, Apply 1 application topically 2 (two) times daily., Disp: , Rfl:  .  clobetasol (TEMOVATE) 0.05 % external solution, Apply 1 application topically 2 (two) times daily. For 2 weeks at a time, Disp: , Rfl:  .  dexamethasone (DECADRON) 4 MG tablet, Take 5 pills, with food, once a week., Disp: 100 tablet, Rfl: 3 .  esomeprazole (NEXIUM) 40 MG capsule, Take 40 mg by mouth daily at 12 noon., Disp: , Rfl:  .  famciclovir (FAMVIR) 500 MG tablet, Take 1 tablet (500 mg total) by mouth daily., Disp: 90 tablet, Rfl: 3 .  famotidine (PEPCID) 20 MG tablet, Take 1 tablet (20 mg total) by mouth 2 (two) times daily., Disp: 60 tablet, Rfl: 3 .  hydrochlorothiazide (MICROZIDE) 12.5 MG capsule, Take  1 capsule (12.5 mg total) by mouth daily., Disp: 90 capsule, Rfl: 3 .  isosorbide mononitrate (IMDUR) 30 MG 24 hr tablet, Take 1 tablet (30 mg total) by mouth 2 (two) times daily. (Patient taking differently: Take 30 mg by mouth 2 (two) times daily. PATIENT TAKES 60 MG DAILY), Disp: 180 tablet, Rfl: 3 .  lenalidomide (REVLIMID) 25 MG capsule, Take 1 pill a day at bedtime for 21 days and then stop for 7 days. [id number], Disp: 21 capsule, Rfl: 0 .  LORazepam (ATIVAN) 1 MG tablet, Take 1.5 tablets (1.5 mg total) by mouth at bedtime., Disp: 135 tablet, Rfl: 0 .  nitroGLYCERIN (NITROSTAT) 0.4 MG SL tablet, Place 0.4 mg under the tongue every 5 (five) minutes as needed. For chest pain, Disp: , Rfl:  .  nystatin (MYCOSTATIN) 100000  UNIT/ML suspension, , Disp: , Rfl:  .  ondansetron (ZOFRAN) 8 MG tablet, Take 1 tablet (8 mg total) by mouth 2 (two) times daily. Start the day after chemo for 2 days. Then take as needed for nausea or vomiting., Disp: 30 tablet, Rfl: 1 .  prasugrel (EFFIENT) 10 MG TABS tablet, Take 10 mg by mouth daily., Disp: , Rfl:  .  prochlorperazine (COMPAZINE) 10 MG tablet, Take 1 tablet (10 mg total) by mouth every 6 (six) hours as needed (Nausea or vomiting)., Disp: 30 tablet, Rfl: 1 .  simvastatin (ZOCOR) 20 MG tablet, Take 1 tablet (20 mg total) by mouth every evening., Disp: 90 tablet, Rfl: 3 .  traMADol (ULTRAM) 50 MG tablet, Take 1 tablet (50 mg total) by mouth every 6 (six) hours as needed for moderate pain., Disp: 90 tablet, Rfl: 2 .  montelukast (SINGULAIR) 10 MG tablet, Take 1 tablet (10 mg total) by mouth at bedtime., Disp: 90 tablet, Rfl: 3 .  pregabalin (LYRICA) 50 MG capsule, 1 tab by mouth daily at bedtime for 1 week, then twice a day for a week, then 3 times a day (Patient not taking: Reported on 09/29/2014), Disp: 42 capsule, Rfl: 0 .  Rivaroxaban (XARELTO STARTER PACK) 15 & 20 MG TBPK, Take as directed on package: Start with one 15mg  tablet by mouth twice a day with food. On Day 22, switch to one 20mg  tablet once a day with food., Disp: 51 each, Rfl: 0 No current facility-administered medications for this visit.  Facility-Administered Medications Ordered in Other Visits:  .  0.9 %  sodium chloride infusion, , Intravenous, Once, [name], MD .  bortezomib SQ (VELCADE) chemo injection 2.75 mg, 1.3 mg/m2 (Treatment Plan Actual), Subcutaneous, Once, [name], MD .  ondansetron [facility]) tablet 8 mg, 8 mg, Oral, Once, [name], MD .  Zoledronic Acid (ZOMETA) 4 mg IVPB, 4 mg, Intravenous, Once, [name], MD  Allergies:  Allergies  Allergen Reactions  . Hydrocodone-Acetaminophen Itching  . Other Itching    Flu-like symptoms from portabello mushrooms  . Codeine  Itching  . Mushroom Extract Complex Nausea And Vomiting    Only a certain type of mushroom (name unknown) causes this reaction.    Past Medical History, Surgical history, Social history, and Family History were reviewed and updated.  Review of Systems: As above  Physical Exam:  height is 6' (1.829 m) and weight is 207 lb (93.895 kg). His oral temperature is 97.4 F (36.3 C). His blood pressure is 122/58 and his pulse is 61.   Wt Readings from Last 3 Encounters:  09/29/14 207 lb (93.895 kg)  09/28/14 200 lb (  90.719 kg)  09/12/14 201 lb (91.173 kg)     Well-developed and well-nourished white woman in no obvious distress. Head and neck exam shows no ocular or oral lesions. There are no palpable cervical or supraclavicular lymph nodes. Lungs are clear. Cardiac exam regular rate and rhythm with no murmurs, rubs or bruits. Abdomen is soft. He has good bowel sounds. There is no fluid wave. There is no palpable liver or spleen tip. Back exam shows no tenderness over the spine, ribs or hips. He is wearing a back brace. Extremities shows no clubbing, cyanosis or edema. There might be some slight swelling of the right lower leg. No obvious venous cord is noted. He has a negative Homans sign with the right leg. He has good pulses in his distal extremities. Neurological exam is nonfocal. Skin exam shows no rashes, ecchymoses or petechia.   Impression and Plan: [name] [name] is 74 year old male. He has IgG kappa myeloma. He has had one cycle of chemotherapy.  This thrombus in the right leg is sore troublesome. Again, the fact that he was on Effient and aspirin and yet he still had the thrombus. I suspect that we will have to check him for a hypercoagulable condition outside of the fact that he has myeloma and is on Revlimid.  I still think we can proceed with treatment for the myeloma. I will going to start him back on Velcade with Revlimid for his second cycle.  I will have to speak to his  cardiologist.  As far as the duration of anticoagulation for him, I suspect that we are probably looking at a year.  I want to see him back in one month.  I will not repeat a Doppler for about 3 months.  I spent about 45 minutes with him today. I was not expecting that he was going to have this thrombus.   [name], MD 8/15/20168:47 AM

## 2014-09-30 ENCOUNTER — Telehealth: Payer: Self-pay | Admitting: Emergency Medicine

## 2014-09-30 NOTE — Telephone Encounter (Signed)
-----   Message from [name], MD sent at 09/30/2014  1:07 PM EDT ----- Please call him and tell him that the myeloma numbers have come down by 75% already. thanks

## 2014-09-30 NOTE — Telephone Encounter (Signed)
Spoke with patient; informed him per Dr Marin Olp that his myeloma numbers have improved by 75%. Patient voiced understanding and appreciated that call.

## 2014-10-01 LAB — SPEP & IFE WITH QIG
ABNORMAL PROTEIN BAND1: 1.2 g/dL
ALPHA-1-GLOBULIN: 0.3 g/dL (ref 0.2–0.3)
ALPHA-2-GLOBULIN: 0.9 g/dL (ref 0.5–0.9)
Albumin ELP: 3.1 g/dL — ABNORMAL LOW (ref 3.8–4.8)
Beta 2: 0.2 g/dL (ref 0.2–0.5)
Beta Globulin: 1.6 g/dL — ABNORMAL HIGH (ref 0.4–0.6)
GAMMA GLOBULIN: 0.3 g/dL — AB (ref 0.8–1.7)
IgA: 18 mg/dL — ABNORMAL LOW (ref 68–379)
IgG (Immunoglobin G), Serum: 1150 mg/dL (ref 650–1600)
IgM, Serum: 15 mg/dL — ABNORMAL LOW (ref 41–251)
TOTAL PROTEIN, SERUM ELECTROPHOR: 6.5 g/dL (ref 6.1–8.1)

## 2014-10-01 LAB — KAPPA/LAMBDA LIGHT CHAINS
KAPPA FREE LGHT CHN: 37.2 mg/dL — AB (ref 0.33–1.94)
Kappa:Lambda Ratio: 62 — ABNORMAL HIGH (ref 0.26–1.65)
LAMBDA FREE LGHT CHN: 0.6 mg/dL (ref 0.57–2.63)

## 2014-10-02 ENCOUNTER — Encounter: Payer: Self-pay | Admitting: *Deleted

## 2014-10-06 ENCOUNTER — Other Ambulatory Visit (HOSPITAL_BASED_OUTPATIENT_CLINIC_OR_DEPARTMENT_OTHER): Payer: Medicare Other

## 2014-10-06 ENCOUNTER — Encounter: Payer: Self-pay | Admitting: Nurse Practitioner

## 2014-10-06 ENCOUNTER — Ambulatory Visit (HOSPITAL_BASED_OUTPATIENT_CLINIC_OR_DEPARTMENT_OTHER): Payer: Medicare Other

## 2014-10-06 VITALS — BP 121/54 | HR 55 | Temp 97.8°F

## 2014-10-06 DIAGNOSIS — Z5112 Encounter for antineoplastic immunotherapy: Secondary | ICD-10-CM

## 2014-10-06 DIAGNOSIS — C9000 Multiple myeloma not having achieved remission: Secondary | ICD-10-CM

## 2014-10-06 LAB — CBC WITH DIFFERENTIAL (CANCER CENTER ONLY)
BASO#: 0 10*3/uL (ref 0.0–0.2)
BASO%: 0.4 % (ref 0.0–2.0)
EOS%: 3.5 % (ref 0.0–7.0)
Eosinophils Absolute: 0.2 10*3/uL (ref 0.0–0.5)
HCT: 31.7 % — ABNORMAL LOW (ref 38.7–49.9)
HGB: 10.4 g/dL — ABNORMAL LOW (ref 13.0–17.1)
LYMPH#: 1.3 10*3/uL (ref 0.9–3.3)
LYMPH%: 24.6 % (ref 14.0–48.0)
MCH: 28.7 pg (ref 28.0–33.4)
MCHC: 32.8 g/dL (ref 32.0–35.9)
MCV: 87 fL (ref 82–98)
MONO#: 0.7 10*3/uL (ref 0.1–0.9)
MONO%: 13.6 % — AB (ref 0.0–13.0)
NEUT%: 57.9 % (ref 40.0–80.0)
NEUTROS ABS: 3.1 10*3/uL (ref 1.5–6.5)
PLATELETS: 161 10*3/uL (ref 145–400)
RBC: 3.63 10*6/uL — ABNORMAL LOW (ref 4.20–5.70)
RDW: 16.6 % — AB (ref 11.1–15.7)
WBC: 5.4 10*3/uL (ref 4.0–10.0)

## 2014-10-06 LAB — CMP (CANCER CENTER ONLY)
ALT(SGPT): 38 U/L (ref 10–47)
AST: 30 U/L (ref 11–38)
Albumin: 3 g/dL — ABNORMAL LOW (ref 3.3–5.5)
Alkaline Phosphatase: 123 U/L — ABNORMAL HIGH (ref 26–84)
BILIRUBIN TOTAL: 0.7 mg/dL (ref 0.20–1.60)
BUN, Bld: 14 mg/dL (ref 7–22)
CO2: 26 meq/L (ref 18–33)
CREATININE: 1.2 mg/dL (ref 0.6–1.2)
Calcium: 8.3 mg/dL (ref 8.0–10.3)
Chloride: 99 mEq/L (ref 98–108)
GLUCOSE: 116 mg/dL (ref 73–118)
Potassium: 3.3 mEq/L (ref 3.3–4.7)
SODIUM: 134 meq/L (ref 128–145)
Total Protein: 6.4 g/dL (ref 6.4–8.1)

## 2014-10-06 LAB — TECHNOLOGIST REVIEW CHCC SATELLITE

## 2014-10-06 MED ORDER — ONDANSETRON HCL 8 MG PO TABS
8.0000 mg | ORAL_TABLET | Freq: Once | ORAL | Status: AC
  Administered 2014-10-06: 8 mg via ORAL

## 2014-10-06 MED ORDER — ONDANSETRON HCL 8 MG PO TABS
ORAL_TABLET | ORAL | Status: AC
Start: 2014-10-06 — End: 2014-10-06
  Filled 2014-10-06: qty 1

## 2014-10-06 MED ORDER — BORTEZOMIB CHEMO SQ INJECTION 3.5 MG (2.5MG/ML)
1.3000 mg/m2 | Freq: Once | INTRAMUSCULAR | Status: AC
  Administered 2014-10-06: 2.75 mg via SUBCUTANEOUS
  Filled 2014-10-06: qty 2.75

## 2014-10-06 NOTE — Progress Notes (Signed)
Information faxed for approval of Clopidogrel 75mg  tablet to [facility]. Authorization received and approved  Until 01/03/2015.

## 2014-10-06 NOTE — Patient Instructions (Signed)
Bortezomib injection What is this medicine? BORTEZOMIB (bor TEZ oh mib) is a chemotherapy drug. It slows the growth of cancer cells. This medicine is used to treat multiple myeloma, and certain lymphomas, such as mantle-cell lymphoma. This medicine may be used for other purposes; ask your health care provider or pharmacist if you have questions. COMMON BRAND NAME(S): Velcade What should I tell my health care provider before I take this medicine? They need to know if you have any of these conditions: -diabetes -heart disease -irregular heartbeat -liver disease -on hemodialysis -low blood counts, like low white blood cells, platelets, or hemoglobin -peripheral neuropathy -taking medicine for blood pressure -an unusual or allergic reaction to bortezomib, mannitol, boron, other medicines, foods, dyes, or preservatives -pregnant or trying to get pregnant -breast-feeding How should I use this medicine? This medicine is for injection into a vein or for injection under the skin. It is given by a health care professional in a hospital or clinic setting. Talk to your pediatrician regarding the use of this medicine in children. Special care may be needed. Overdosage: If you think you have taken too much of this medicine contact a poison control center or emergency room at once. NOTE: This medicine is only for you. Do not share this medicine with others. What if I miss a dose? It is important not to miss your dose. Call your doctor or health care professional if you are unable to keep an appointment. What may interact with this medicine? This medicine may interact with the following medications: -ketoconazole -rifampin -ritonavir -St. John's Wort This list may not describe all possible interactions. Give your health care provider a list of all the medicines, herbs, non-prescription drugs, or dietary supplements you use. Also tell them if you smoke, drink alcohol, or use illegal drugs. Some items  may interact with your medicine. What should I watch for while using this medicine? Visit your doctor for checks on your progress. This drug may make you feel generally unwell. This is not uncommon, as chemotherapy can affect healthy cells as well as cancer cells. Report any side effects. Continue your course of treatment even though you feel ill unless your doctor tells you to stop. You may get drowsy or dizzy. Do not drive, use machinery, or do anything that needs mental alertness until you know how this medicine affects you. Do not stand or sit up quickly, especially if you are an older patient. This reduces the risk of dizzy or fainting spells. In some cases, you may be given additional medicines to help with side effects. Follow all directions for their use. Call your doctor or health care professional for advice if you get a fever, chills or sore throat, or other symptoms of a cold or flu. Do not treat yourself. This drug decreases your body's ability to fight infections. Try to avoid being around people who are sick. This medicine may increase your risk to bruise or bleed. Call your doctor or health care professional if you notice any unusual bleeding. You may need blood work done while you are taking this medicine. In some patients, this medicine may cause a serious brain infection that may cause death. If you have any problems seeing, thinking, speaking, walking, or standing, tell your doctor right away. If you cannot reach your doctor, urgently seek other source of medical care. Do not become pregnant while taking this medicine. Women should inform their doctor if they wish to become pregnant or think they might be pregnant. There is   a potential for serious side effects to an unborn child. Talk to your health care professional or pharmacist for more information. Do not breast-feed an infant while taking this medicine. Check with your doctor or health care professional if you get an attack of  severe diarrhea, nausea and vomiting, or if you sweat a lot. The loss of too much body fluid can make it dangerous for you to take this medicine. What side effects may I notice from receiving this medicine? Side effects that you should report to your doctor or health care professional as soon as possible: -allergic reactions like skin rash, itching or hives, swelling of the face, lips, or tongue -breathing problems -changes in hearing -changes in vision -fast, irregular heartbeat -feeling faint or lightheaded, falls -pain, tingling, numbness in the hands or feet -right upper belly pain -seizures -swelling of the ankles, feet, hands -unusual bleeding or bruising -unusually weak or tired -vomiting -yellowing of the eyes or skin Side effects that usually do not require medical attention (report to your doctor or health care professional if they continue or are bothersome): -changes in emotions or moods -constipation -diarrhea -loss of appetite -headache -irritation at site where injected -nausea This list may not describe all possible side effects. Call your doctor for medical advice about side effects. You may report side effects to FDA at 1-800-FDA-1088. Where should I keep my medicine? This drug is given in a hospital or clinic and will not be stored at home. NOTE: This sheet is a summary. It may not cover all possible information. If you have questions about this medicine, talk to your doctor, pharmacist, or health care provider.  2015, [employer]vier/Gold Standard. (2012-11-26 12:46:32)  

## 2014-10-07 ENCOUNTER — Telehealth: Payer: Self-pay | Admitting: Hematology & Oncology

## 2014-10-07 NOTE — Telephone Encounter (Signed)
[employer] has APPROVED the CLOPIDOGREL 75 MG TABLET  and is good until 01/03/2015.         COPY SCANNED

## 2014-10-10 ENCOUNTER — Ambulatory Visit (INDEPENDENT_AMBULATORY_CARE_PROVIDER_SITE_OTHER): Payer: Medicare Other | Admitting: Sports Medicine

## 2014-10-10 ENCOUNTER — Encounter: Payer: Self-pay | Admitting: Sports Medicine

## 2014-10-10 VITALS — BP 125/61 | HR 62 | Ht 72.0 in | Wt 202.0 lb

## 2014-10-10 DIAGNOSIS — I82441 Acute embolism and thrombosis of right tibial vein: Secondary | ICD-10-CM

## 2014-10-10 DIAGNOSIS — G4700 Insomnia, unspecified: Secondary | ICD-10-CM

## 2014-10-10 DIAGNOSIS — I2510 Atherosclerotic heart disease of native coronary artery without angina pectoris: Secondary | ICD-10-CM | POA: Diagnosis not present

## 2014-10-10 DIAGNOSIS — G629 Polyneuropathy, unspecified: Secondary | ICD-10-CM | POA: Diagnosis not present

## 2014-10-10 DIAGNOSIS — C9000 Multiple myeloma not having achieved remission: Secondary | ICD-10-CM

## 2014-10-10 MED ORDER — LORAZEPAM 1 MG PO TABS: 1.5000 mg | ORAL_TABLET | Freq: Every day | ORAL | Status: DC

## 2014-10-10 MED ORDER — VITAMIN B-6 50 MG PO TABS: 50.0000 mg | ORAL_TABLET | Freq: Every day | ORAL | Status: DC

## 2014-10-10 NOTE — Assessment & Plan Note (Signed)
Currently in remission 

## 2014-10-10 NOTE — Assessment & Plan Note (Signed)
Doing well with lorazepam. We will refill at 1.5 mg daily.

## 2014-10-10 NOTE — Progress Notes (Signed)
  Subjective:    CC: follow-up  HPI: This pleasant 74 year old male returns, he is currently in remission for multiple myeloma, unfortunately he did developed right tibial vein DVT, he has been on Xarelto, and is doing well. He does lower extremity compression stockings appropriately to prevent post-thrombotic syndrome.  Overall he is happy with how things are going, he is currently doing his chemotherapy, as well as dexamethasone.  He has been staying active, and maintaining weight. The main reason for his visit was to discuss his neuropathy which thus far has not been controlled with gabapentin, amitriptyline, or Lyrica, he is okay with living with it for now until his multiple myeloma is treated. He also would like some advice on how to maintain his physical fitness during this trying process.  Past medical history, Surgical history, Family history not pertinant except as noted below, Social history, Allergies, and medications have been entered into the medical record, reviewed, and no changes needed.   Review of Systems: No fevers, chills, night sweats, weight loss, chest pain, or shortness of breath.   Objective:    General: Well Developed, well nourished, and in no acute distress.  Neuro: Alert and oriented x3, extra-ocular muscles intact, sensation grossly intact.  HEENT: Normocephalic, atraumatic, pupils equal round reactive to light, neck supple, no masses, no lymphadenopathy, thyroid nonpalpable.  Skin: Warm and dry, no rashes. Cardiac: Regular rate and rhythm, no murmurs rubs or gallops, no lower extremity edema.  Respiratory: Clear to auscultation bilaterally. Not using accessory muscles, speaking in full sentences.  Impression and Recommendations:   I spent 25 minutes with this patient, greater than 50% was face-to-face time counseling regarding the above diagnoses, we spent some time discussing concepts of athletic training, he does desire to use this on a daily basis in the  gym.

## 2014-10-10 NOTE — Assessment & Plan Note (Signed)
Has had inadequate responses to gabapentin, amitriptyline, Lyrica, secondary to multiple myeloma peripheral neuropathy. At this point we are simply going to use vitamin B6 50 mg daily.

## 2014-10-10 NOTE — Assessment & Plan Note (Signed)
Unfortunately developed a DVT during his chemotherapy. Currently on Xarelto. We will do one year anticoagulation per hematology recommendations.

## 2014-10-13 ENCOUNTER — Other Ambulatory Visit (HOSPITAL_BASED_OUTPATIENT_CLINIC_OR_DEPARTMENT_OTHER): Payer: Medicare Other

## 2014-10-13 ENCOUNTER — Telehealth: Payer: Self-pay | Admitting: *Deleted

## 2014-10-13 ENCOUNTER — Ambulatory Visit (HOSPITAL_BASED_OUTPATIENT_CLINIC_OR_DEPARTMENT_OTHER): Payer: Medicare Other

## 2014-10-13 VITALS — BP 109/54 | HR 56 | Temp 98.1°F | Resp 18

## 2014-10-13 DIAGNOSIS — C9000 Multiple myeloma not having achieved remission: Secondary | ICD-10-CM

## 2014-10-13 DIAGNOSIS — Z5112 Encounter for antineoplastic immunotherapy: Secondary | ICD-10-CM | POA: Diagnosis not present

## 2014-10-13 LAB — CMP (CANCER CENTER ONLY)
ALK PHOS: 117 U/L — AB (ref 26–84)
ALT: 37 U/L (ref 10–47)
AST: 27 U/L (ref 11–38)
Albumin: 3.1 g/dL — ABNORMAL LOW (ref 3.3–5.5)
BUN: 14 mg/dL (ref 7–22)
CALCIUM: 8.4 mg/dL (ref 8.0–10.3)
CHLORIDE: 99 meq/L (ref 98–108)
CO2: 27 mEq/L (ref 18–33)
Creat: 1.3 mg/dl — ABNORMAL HIGH (ref 0.6–1.2)
GLUCOSE: 102 mg/dL (ref 73–118)
POTASSIUM: 2.8 meq/L — AB (ref 3.3–4.7)
Sodium: 136 mEq/L (ref 128–145)
Total Bilirubin: 0.9 mg/dl (ref 0.20–1.60)
Total Protein: 6.6 g/dL (ref 6.4–8.1)

## 2014-10-13 LAB — CBC WITH DIFFERENTIAL (CANCER CENTER ONLY)
BASO#: 0.1 10*3/uL (ref 0.0–0.2)
BASO%: 1.6 % (ref 0.0–2.0)
EOS ABS: 0.3 10*3/uL (ref 0.0–0.5)
EOS%: 3 % (ref 0.0–7.0)
HEMATOCRIT: 32.8 % — AB (ref 38.7–49.9)
HGB: 10.9 g/dL — ABNORMAL LOW (ref 13.0–17.1)
LYMPH#: 1.7 10*3/uL (ref 0.9–3.3)
LYMPH%: 20.2 % (ref 14.0–48.0)
MCH: 28.8 pg (ref 28.0–33.4)
MCHC: 33.2 g/dL (ref 32.0–35.9)
MCV: 87 fL (ref 82–98)
MONO#: 1.3 10*3/uL — AB (ref 0.1–0.9)
MONO%: 15.8 % — ABNORMAL HIGH (ref 0.0–13.0)
NEUT#: 4.9 10*3/uL (ref 1.5–6.5)
NEUT%: 59.4 % (ref 40.0–80.0)
PLATELETS: 153 10*3/uL (ref 145–400)
RBC: 3.79 10*6/uL — ABNORMAL LOW (ref 4.20–5.70)
RDW: 16.7 % — AB (ref 11.1–15.7)
WBC: 8.2 10*3/uL (ref 4.0–10.0)

## 2014-10-13 MED ORDER — BORTEZOMIB CHEMO SQ INJECTION 3.5 MG (2.5MG/ML)
1.3000 mg/m2 | Freq: Once | INTRAMUSCULAR | Status: AC
  Administered 2014-10-13: 2.75 mg via SUBCUTANEOUS
  Filled 2014-10-13: qty 2.75

## 2014-10-13 MED ORDER — POTASSIUM CHLORIDE CRYS ER 20 MEQ PO TBCR: EXTENDED_RELEASE_TABLET | ORAL | Status: DC

## 2014-10-13 MED ORDER — ONDANSETRON HCL 8 MG PO TABS
8.0000 mg | ORAL_TABLET | Freq: Once | ORAL | Status: AC
  Administered 2014-10-13: 8 mg via ORAL

## 2014-10-13 MED ORDER — ONDANSETRON HCL 8 MG PO TABS
ORAL_TABLET | ORAL | Status: AC
  Filled 2014-10-13: qty 1

## 2014-10-13 NOTE — Telephone Encounter (Signed)
Critical Value Potassium 2.8 Dr Marin Olp notified and orders received

## 2014-10-13 NOTE — Patient Instructions (Signed)
Bortezomib injection What is this medicine? BORTEZOMIB (bor TEZ oh mib) is a chemotherapy drug. It slows the growth of cancer cells. This medicine is used to treat multiple myeloma, and certain lymphomas, such as mantle-cell lymphoma. This medicine may be used for other purposes; ask your health care provider or pharmacist if you have questions. COMMON BRAND NAME(S): Velcade What should I tell my health care provider before I take this medicine? They need to know if you have any of these conditions: -diabetes -heart disease -irregular heartbeat -liver disease -on hemodialysis -low blood counts, like low white blood cells, platelets, or hemoglobin -peripheral neuropathy -taking medicine for blood pressure -an unusual or allergic reaction to bortezomib, mannitol, boron, other medicines, foods, dyes, or preservatives -pregnant or trying to get pregnant -breast-feeding How should I use this medicine? This medicine is for injection into a vein or for injection under the skin. It is given by a health care professional in a hospital or clinic setting. Talk to your pediatrician regarding the use of this medicine in children. Special care may be needed. Overdosage: If you think you have taken too much of this medicine contact a poison control center or emergency room at once. NOTE: This medicine is only for you. Do not share this medicine with others. What if I miss a dose? It is important not to miss your dose. Call your doctor or health care professional if you are unable to keep an appointment. What may interact with this medicine? This medicine may interact with the following medications: -ketoconazole -rifampin -ritonavir -St. John's Wort This list may not describe all possible interactions. Give your health care provider a list of all the medicines, herbs, non-prescription drugs, or dietary supplements you use. Also tell them if you smoke, drink alcohol, or use illegal drugs. Some items  may interact with your medicine. What should I watch for while using this medicine? Visit your doctor for checks on your progress. This drug may make you feel generally unwell. This is not uncommon, as chemotherapy can affect healthy cells as well as cancer cells. Report any side effects. Continue your course of treatment even though you feel ill unless your doctor tells you to stop. You may get drowsy or dizzy. Do not drive, use machinery, or do anything that needs mental alertness until you know how this medicine affects you. Do not stand or sit up quickly, especially if you are an older patient. This reduces the risk of dizzy or fainting spells. In some cases, you may be given additional medicines to help with side effects. Follow all directions for their use. Call your doctor or health care professional for advice if you get a fever, chills or sore throat, or other symptoms of a cold or flu. Do not treat yourself. This drug decreases your body's ability to fight infections. Try to avoid being around people who are sick. This medicine may increase your risk to bruise or bleed. Call your doctor or health care professional if you notice any unusual bleeding. You may need blood work done while you are taking this medicine. In some patients, this medicine may cause a serious brain infection that may cause death. If you have any problems seeing, thinking, speaking, walking, or standing, tell your doctor right away. If you cannot reach your doctor, urgently seek other source of medical care. Do not become pregnant while taking this medicine. Women should inform their doctor if they wish to become pregnant or think they might be pregnant. There is   a potential for serious side effects to an unborn child. Talk to your health care professional or pharmacist for more information. Do not breast-feed an infant while taking this medicine. Check with your doctor or health care professional if you get an attack of  severe diarrhea, nausea and vomiting, or if you sweat a lot. The loss of too much body fluid can make it dangerous for you to take this medicine. What side effects may I notice from receiving this medicine? Side effects that you should report to your doctor or health care professional as soon as possible: -allergic reactions like skin rash, itching or hives, swelling of the face, lips, or tongue -breathing problems -changes in hearing -changes in vision -fast, irregular heartbeat -feeling faint or lightheaded, falls -pain, tingling, numbness in the hands or feet -right upper belly pain -seizures -swelling of the ankles, feet, hands -unusual bleeding or bruising -unusually weak or tired -vomiting -yellowing of the eyes or skin Side effects that usually do not require medical attention (report to your doctor or health care professional if they continue or are bothersome): -changes in emotions or moods -constipation -diarrhea -loss of appetite -headache -irritation at site where injected -nausea This list may not describe all possible side effects. Call your doctor for medical advice about side effects. You may report side effects to FDA at 1-800-FDA-1088. Where should I keep my medicine? This drug is given in a hospital or clinic and will not be stored at home. NOTE: This sheet is a summary. It may not cover all possible information. If you have questions about this medicine, talk to your doctor, pharmacist, or health care provider.  2015, [employer]vier/Gold Standard. (2012-11-26 12:46:32)  

## 2014-10-17 ENCOUNTER — Other Ambulatory Visit: Payer: Self-pay | Admitting: *Deleted

## 2014-10-17 DIAGNOSIS — C9000 Multiple myeloma not having achieved remission: Secondary | ICD-10-CM

## 2014-10-17 MED ORDER — LENALIDOMIDE 25 MG PO CAPS: ORAL_CAPSULE | ORAL | Status: DC

## 2014-10-27 ENCOUNTER — Ambulatory Visit (HOSPITAL_BASED_OUTPATIENT_CLINIC_OR_DEPARTMENT_OTHER): Payer: Medicare Other

## 2014-10-27 ENCOUNTER — Other Ambulatory Visit: Payer: Self-pay | Admitting: *Deleted

## 2014-10-27 ENCOUNTER — Encounter: Payer: Self-pay | Admitting: Hematology & Oncology

## 2014-10-27 ENCOUNTER — Telehealth: Payer: Self-pay | Admitting: *Deleted

## 2014-10-27 ENCOUNTER — Ambulatory Visit: Payer: Medicare Other

## 2014-10-27 ENCOUNTER — Ambulatory Visit (HOSPITAL_BASED_OUTPATIENT_CLINIC_OR_DEPARTMENT_OTHER): Payer: Medicare Other | Admitting: Hematology & Oncology

## 2014-10-27 ENCOUNTER — Other Ambulatory Visit: Payer: Medicare Other

## 2014-10-27 VITALS — BP 130/64 | HR 60 | Temp 97.7°F | Resp 18 | Ht 72.0 in | Wt 205.0 lb

## 2014-10-27 DIAGNOSIS — E876 Hypokalemia: Secondary | ICD-10-CM

## 2014-10-27 DIAGNOSIS — I82441 Acute embolism and thrombosis of right tibial vein: Secondary | ICD-10-CM

## 2014-10-27 DIAGNOSIS — I82401 Acute embolism and thrombosis of unspecified deep veins of right lower extremity: Secondary | ICD-10-CM | POA: Diagnosis not present

## 2014-10-27 DIAGNOSIS — I2510 Atherosclerotic heart disease of native coronary artery without angina pectoris: Secondary | ICD-10-CM

## 2014-10-27 DIAGNOSIS — C9000 Multiple myeloma not having achieved remission: Secondary | ICD-10-CM

## 2014-10-27 DIAGNOSIS — I82409 Acute embolism and thrombosis of unspecified deep veins of unspecified lower extremity: Secondary | ICD-10-CM | POA: Diagnosis not present

## 2014-10-27 DIAGNOSIS — Z5112 Encounter for antineoplastic immunotherapy: Secondary | ICD-10-CM | POA: Diagnosis not present

## 2014-10-27 LAB — CMP (CANCER CENTER ONLY)
ALK PHOS: 115 U/L — AB (ref 26–84)
ALT(SGPT): 36 U/L (ref 10–47)
AST: 26 U/L (ref 11–38)
Albumin: 2.9 g/dL — ABNORMAL LOW (ref 3.3–5.5)
BUN: 13 mg/dL (ref 7–22)
CALCIUM: 8.6 mg/dL (ref 8.0–10.3)
CHLORIDE: 103 meq/L (ref 98–108)
CO2: 25 mEq/L (ref 18–33)
Creat: 1.1 mg/dl (ref 0.6–1.2)
GLUCOSE: 129 mg/dL — AB (ref 73–118)
POTASSIUM: 3 meq/L — AB (ref 3.3–4.7)
Sodium: 133 mEq/L (ref 128–145)
Total Bilirubin: 0.8 mg/dl (ref 0.20–1.60)
Total Protein: 5.9 g/dL — ABNORMAL LOW (ref 6.4–8.1)

## 2014-10-27 LAB — CBC WITH DIFFERENTIAL (CANCER CENTER ONLY)
BASO#: 0.1 10*3/uL (ref 0.0–0.2)
BASO%: 1.5 % (ref 0.0–2.0)
EOS ABS: 0.1 10*3/uL (ref 0.0–0.5)
EOS%: 2.1 % (ref 0.0–7.0)
HEMATOCRIT: 32.3 % — AB (ref 38.7–49.9)
HEMOGLOBIN: 10.5 g/dL — AB (ref 13.0–17.1)
LYMPH#: 1.8 10*3/uL (ref 0.9–3.3)
LYMPH%: 33.5 % (ref 14.0–48.0)
MCH: 28.5 pg (ref 28.0–33.4)
MCHC: 32.5 g/dL (ref 32.0–35.9)
MCV: 88 fL (ref 82–98)
MONO#: 0.8 10*3/uL (ref 0.1–0.9)
MONO%: 15.7 % — ABNORMAL HIGH (ref 0.0–13.0)
NEUT#: 2.5 10*3/uL (ref 1.5–6.5)
NEUT%: 47.2 % (ref 40.0–80.0)
Platelets: 205 10*3/uL (ref 145–400)
RBC: 3.69 10*6/uL — ABNORMAL LOW (ref 4.20–5.70)
RDW: 16.7 % — AB (ref 11.1–15.7)
WBC: 5.2 10*3/uL (ref 4.0–10.0)

## 2014-10-27 MED ORDER — CLOPIDOGREL BISULFATE 75 MG PO TABS: 75.0000 mg | ORAL_TABLET | Freq: Every day | ORAL | Status: DC

## 2014-10-27 MED ORDER — POTASSIUM CHLORIDE CRYS ER 20 MEQ PO TBCR: 40.0000 meq | EXTENDED_RELEASE_TABLET | Freq: Two times a day (BID) | ORAL | Status: DC

## 2014-10-27 MED ORDER — BORTEZOMIB CHEMO SQ INJECTION 3.5 MG (2.5MG/ML)
1.3000 mg/m2 | Freq: Once | INTRAMUSCULAR | Status: AC
  Administered 2014-10-27: 2.75 mg via SUBCUTANEOUS
  Filled 2014-10-27: qty 2.75

## 2014-10-27 MED ORDER — SODIUM CHLORIDE 0.9 % IV SOLN
Freq: Once | INTRAVENOUS | Status: AC
  Administered 2014-10-27: 09:00:00 via INTRAVENOUS

## 2014-10-27 MED ORDER — ONDANSETRON HCL 8 MG PO TABS
ORAL_TABLET | ORAL | Status: AC
  Filled 2014-10-27: qty 1

## 2014-10-27 MED ORDER — RIVAROXABAN 20 MG PO TABS: 20.0000 mg | ORAL_TABLET | Freq: Every day | ORAL | Status: DC

## 2014-10-27 MED ORDER — ONDANSETRON HCL 8 MG PO TABS
8.0000 mg | ORAL_TABLET | Freq: Once | ORAL | Status: AC
  Administered 2014-10-27: 8 mg via ORAL

## 2014-10-27 MED ORDER — ZOLEDRONIC ACID 4 MG/100ML IV SOLN
4.0000 mg | Freq: Once | INTRAVENOUS | Status: AC
  Administered 2014-10-27: 4 mg via INTRAVENOUS
  Filled 2014-10-27: qty 100

## 2014-10-27 NOTE — Progress Notes (Signed)
Hematology and Oncology Follow Up Visit  [name] [name] [id number] [birth year] 74 y.o. 10/27/2014   Principle Diagnosis:   IgG kappa myeloma  Acute thromboembolism of the right lower leg  Current Therapy:    Patient  s/p cycle #2 of Velcade/Revlimid/Decadron  Zometa 4 mg IV every month 20 mg by mouth dailyHistory:  [name] is back for follow-up.  he looks quite good. He feels good. His right leg is no longer bothering him. He had a DVT in that leg. He is on Xarelto right now.  He's done well with the chemotherapy. His myeloma studies have improved. Back in August, his IgG level was down to 1150 mg/dL. His Kappa Lightchain was down to 37.5 mg/dL.  He does have a little bit of a cold. He's had some sinus issues. I told him to try some over-the-counter Claritin or Zyrtec.  He's had no bleeding.  He's had no change in bowel or bladder habits.  He's had no rashes.  He's had no fever.   Overall, his performance status is ECOG 1.    Medications:  Current outpatient prescriptions:  .  amLODipine (NORVASC) 5 MG tablet, Take 1 tablet (5 mg total) by mouth daily., Disp: 90 tablet, Rfl: 3 .  atorvastatin (LIPITOR) 20 MG tablet, Take 1 tablet (20 mg total) by mouth daily., Disp: 90 tablet, Rfl: 3 .  baclofen (LIORESAL) 10 MG tablet, , Disp: , Rfl:  .  carvedilol (COREG) 6.25 MG tablet, Take 1 tablet (6.25 mg total) by mouth 2 (two) times daily with a meal., Disp: 180 tablet, Rfl: 3 .  clopidogrel (PLAVIX) 75 MG tablet, Take 1 tablet (75 mg total) by mouth daily., Disp: 30 tablet, Rfl: 6 .  dexamethasone (DECADRON) 4 MG tablet, Take 5 pills, with food, once a week. (Patient taking differently: Take 3 pills, with food, once a week.), Disp: 100 tablet, Rfl: 3 .  esomeprazole (NEXIUM) 40 MG capsule, Take 40 mg by mouth daily at 12 noon., Disp: , Rfl:  .  famciclovir (FAMVIR) 500 MG tablet, Take 1 tablet (500 mg total) by mouth daily., Disp: 90 tablet, Rfl: 3 .  famotidine (PEPCID) 20  MG tablet, Take 1 tablet (20 mg total) by mouth 2 (two) times daily., Disp: 60 tablet, Rfl: 3 .  hydrochlorothiazide (MICROZIDE) 12.5 MG capsule, Take 1 capsule (12.5 mg total) by mouth daily., Disp: 90 capsule, Rfl: 3 .  isosorbide mononitrate (IMDUR) 30 MG 24 hr tablet, Take 1 tablet (30 mg total) by mouth 2 (two) times daily. (Patient taking differently: Take 30 mg by mouth 2 (two) times daily. PATIENT TAKES 60 MG DAILY), Disp: 180 tablet, Rfl: 3 .  lenalidomide (REVLIMID) 25 MG capsule, Take 1 pill a day at bedtime for 21 days and then stop for 7 days. [facility]HCW#[id number], Disp: 21 capsule, Rfl: 0 .  LORazepam (ATIVAN) 1 MG tablet, Take 1.5 tablets (1.5 mg total) by mouth at bedtime., Disp: 135 tablet, Rfl: 0 .  montelukast (SINGULAIR) 10 MG tablet, Take 1 tablet (10 mg total) by mouth at bedtime., Disp: 90 tablet, Rfl: 3 .  nitroGLYCERIN (NITROSTAT) 0.4 MG SL tablet, Place 0.4 mg under the tongue every 5 (five) minutes as needed. For chest pain, Disp: , Rfl:  .  ondansetron (ZOFRAN) 8 MG tablet, Take 1 tablet (8 mg total) by mouth 2 (two) times daily. Start the day after chemo for 2 days. Then take as needed for nausea or vomiting., Disp: 30 tablet, Rfl: 1 .  potassium chloride SA (  K-DUR,KLOR-CON) 20 MEQ tablet, Take 30meq daily for 5 days, then take 36meq daily, Disp: 35 tablet, Rfl: 3 .  prochlorperazine (COMPAZINE) 10 MG tablet, Take 1 tablet (10 mg total) by mouth every 6 (six) hours as needed (Nausea or vomiting)., Disp: 30 tablet, Rfl: 1 .  pyridOXINE (VITAMIN B-6) 50 MG tablet, Take 1 tablet (50 mg total) by mouth daily., Disp: 90 tablet, Rfl: 3 .  Rivaroxaban (XARELTO STARTER PACK) 15 & 20 MG TBPK, Take as directed on package: Start with one 15mg  tablet by mouth twice a day with food. On Day 22, switch to one 20mg  tablet once a day with food., Disp: 51 each, Rfl: 0 .  simvastatin (ZOCOR) 20 MG tablet, Take 1 tablet (20 mg total) by mouth every evening., Disp: 90 tablet, Rfl: 3 .  traMADol (ULTRAM)  50 MG tablet, Take 1 tablet (50 mg total) by mouth every 6 (six) hours as needed for moderate pain., Disp: 90 tablet, Rfl: 2 No current facility-administered medications for this visit.  Facility-Administered Medications Ordered in Other Visits:  .  0.9 %  sodium chloride infusion, , Intravenous, Once, [name], MD .  zolendronic acid (ZOMETA) 4 mg in sodium chloride 0.9 % 100 mL IVPB, 4 mg, Intravenous, Once, [name], MD  Allergies:  Allergies  Allergen Reactions  . Hydrocodone-Acetaminophen Itching  . Other Itching    Flu-like symptoms from portabello mushrooms  . Codeine Itching  . Mushroom Extract Complex Nausea And Vomiting    Only a certain type of mushroom (name unknown) causes this reaction.    Past Medical History, Surgical history, Social history, and Family History were reviewed and updated.  Review of Systems: As above  Physical Exam:  height is 6' (1.829 m) and weight is 205 lb (92.987 kg). His oral temperature is 97.7 F (36.5 C). His blood pressure is 130/64 and his pulse is 60. His respiration is 18.   Wt Readings from Last 3 Encounters:  10/27/14 205 lb (92.987 kg)  10/10/14 202 lb (91.627 kg)  09/29/14 207 lb (93.895 kg)     Well-developed and well-nourished white woman in no obvious distress. Head and neck exam shows no ocular or oral lesions. There are no palpable cervical or supraclavicular lymph nodes. Lungs are clear. Cardiac exam regular rate and rhythm with no murmurs, rubs or bruits. Abdomen is soft. He has good bowel sounds. There is no fluid wave. There is no palpable liver or spleen tip. Back exam shows no tenderness over the spine, ribs or hips. He is wearing a back brace. Extremities shows no clubbing, cyanosis or edema. There might be some slight swelling of the right lower leg. No obvious venous cord is noted. He has a negative Homans sign with the right leg. He has good pulses in his distal extremities. Neurological exam is nonfocal.  Skin exam shows no rashes, ecchymoses or petechia.   Impression and Plan: [name] is 74 year old male. He has IgG kappa myeloma. He has had  2 cycles  of chemotherapy.  Again, I would think that he is responding. We will see what his levels look like today.  I'll for now, we will proceed with his third cycle.  He will continue on Xarelto for his thrombus.  We will plan to see him back in one more month.    [name], MD 9/12/20169:14 AM

## 2014-10-27 NOTE — Telephone Encounter (Signed)
Critical Value Potassium 3.0 Dr Marin Olp notified and orders received

## 2014-10-27 NOTE — Patient Instructions (Signed)
[facility] Discharge Instructions for Patients Receiving Chemotherapy  Today you received the following chemotherapy agents Velcade  To help prevent nausea and vomiting after your treatment, we encourage you to take your nausea medication    If you develop nausea and vomiting that is not controlled by your nausea medication, call the clinic.   BELOW ARE SYMPTOMS THAT SHOULD BE REPORTED IMMEDIATELY:  *FEVER GREATER THAN 100.5 F  *CHILLS WITH OR WITHOUT FEVER  NAUSEA AND VOMITING THAT IS NOT CONTROLLED WITH YOUR NAUSEA MEDICATION  *UNUSUAL SHORTNESS OF BREATH  *UNUSUAL BRUISING OR BLEEDING  TENDERNESS IN MOUTH AND THROAT WITH OR WITHOUT PRESENCE OF ULCERS  *URINARY PROBLEMS  *BOWEL PROBLEMS  UNUSUAL RASH Items with * indicate a potential emergency and should be followed up as soon as possible.  Feel free to call the clinic you have any questions or concerns. The clinic phone number is [phone].  Please show the CHEMO ALERT CARD at check-in to the Emergency Department and triage nurse.   

## 2014-10-28 ENCOUNTER — Telehealth: Payer: Self-pay | Admitting: *Deleted

## 2014-10-28 DIAGNOSIS — I2510 Atherosclerotic heart disease of native coronary artery without angina pectoris: Secondary | ICD-10-CM

## 2014-10-28 DIAGNOSIS — D1801 Hemangioma of skin and subcutaneous tissue: Secondary | ICD-10-CM | POA: Diagnosis not present

## 2014-10-28 DIAGNOSIS — L570 Actinic keratosis: Secondary | ICD-10-CM | POA: Diagnosis not present

## 2014-10-28 DIAGNOSIS — L814 Other melanin hyperpigmentation: Secondary | ICD-10-CM | POA: Diagnosis not present

## 2014-10-28 DIAGNOSIS — L579 Skin changes due to chronic exposure to nonionizing radiation, unspecified: Secondary | ICD-10-CM | POA: Diagnosis not present

## 2014-10-28 DIAGNOSIS — Z85828 Personal history of other malignant neoplasm of skin: Secondary | ICD-10-CM | POA: Diagnosis not present

## 2014-10-28 DIAGNOSIS — I82441 Acute embolism and thrombosis of right tibial vein: Secondary | ICD-10-CM

## 2014-10-28 NOTE — Telephone Encounter (Signed)
-----   Message from [name], MD sent at 10/28/2014 11:03 AM EDT ----- Please call and tell him that the light chain has come down by 75%. This is fantastic. Thanks

## 2014-10-31 LAB — HYPERCOAGULABLE PANEL, COMPREHENSIVE
ANTICARDIOLIPIN IGG: 14 GPL U/mL (ref <23)
ANTICARDIOLIPIN IGM: 0 [MPL'U]/mL (ref <11)
AntiThromb III Func: 116 % activity (ref 80–120)
Anticardiolipin IgA: 0 APL U/mL (ref <22)
BETA 2 GLYCO I IGG: 4 G Units (ref <20)
BETA-2-GLYCOPROTEIN I IGA: 7 A Units (ref <20)
BETA-2-GLYCOPROTEIN I IGM: 7 M Units (ref <20)
PROTEIN C ACTIVITY: 163 % (ref 70–180)
PROTEIN C ANTIGEN: 104 % (ref 70–140)
PROTEIN S ACTIVITY: 158 % — AB (ref 70–150)
PROTEIN S ANTIGEN, TOTAL: 122 % (ref 70–140)

## 2014-10-31 LAB — PROTEIN ELECTROPHORESIS, SERUM, WITH REFLEX
ALBUMIN ELP: 3.1 g/dL — AB (ref 3.8–4.8)
ALPHA-1-GLOBULIN: 0.3 g/dL (ref 0.2–0.3)
Abnormal Protein Band1: 0.8 g/dL
Alpha-2-Globulin: 0.8 g/dL (ref 0.5–0.9)
BETA 2: 0.2 g/dL (ref 0.2–0.5)
BETA GLOBULIN: 1.1 g/dL — AB (ref 0.4–0.6)
Gamma Globulin: 0.3 g/dL — ABNORMAL LOW (ref 0.8–1.7)
Total Protein, Serum Electrophoresis: 5.9 g/dL — ABNORMAL LOW (ref 6.1–8.1)

## 2014-10-31 LAB — RFX DRVVT SCR W/RFLX CONF 1:1 MIX
DRVVT MIX INTERPRETATION: POSITIVE — AB
DRVVT SCREEN: 104 s — AB (ref <=45)

## 2014-10-31 LAB — KAPPA/LAMBDA LIGHT CHAINS
KAPPA FREE LGHT CHN: 12.5 mg/dL — AB (ref 0.33–1.94)
Kappa:Lambda Ratio: 8.68 — ABNORMAL HIGH (ref 0.26–1.65)
Lambda Free Lght Chn: 1.44 mg/dL (ref 0.57–2.63)

## 2014-10-31 LAB — RFX PTT-LA W/RFX TO HEX PHASE CONF
PTT-LA Screen: 53 s — ABNORMAL HIGH
PTT-LA Screen: 53 s — ABNORMAL HIGH (ref <=40)

## 2014-10-31 LAB — IGG, IGA, IGM
IGA: 22 mg/dL — AB (ref 68–379)
IGG (IMMUNOGLOBIN G), SERUM: 988 mg/dL (ref 650–1600)
IGM, SERUM: 20 mg/dL — AB (ref 41–251)

## 2014-10-31 LAB — IFE INTERPRETATION

## 2014-11-03 ENCOUNTER — Other Ambulatory Visit: Payer: Medicare Other

## 2014-11-03 ENCOUNTER — Ambulatory Visit (HOSPITAL_BASED_OUTPATIENT_CLINIC_OR_DEPARTMENT_OTHER): Payer: Medicare Other

## 2014-11-03 VITALS — BP 105/57 | HR 57 | Temp 98.2°F | Resp 18

## 2014-11-03 DIAGNOSIS — C9000 Multiple myeloma not having achieved remission: Secondary | ICD-10-CM

## 2014-11-03 DIAGNOSIS — Z5112 Encounter for antineoplastic immunotherapy: Secondary | ICD-10-CM

## 2014-11-03 LAB — BASIC METABOLIC PANEL
BUN: 12 mg/dL (ref 7–25)
CALCIUM: 8.2 mg/dL — AB (ref 8.6–10.3)
CO2: 27 mmol/L (ref 20–31)
Chloride: 101 mmol/L (ref 98–110)
Creatinine, Ser: 1.18 mg/dL (ref 0.70–1.18)
GLUCOSE: 104 mg/dL — AB (ref 65–99)
POTASSIUM: 3.6 mmol/L (ref 3.5–5.3)
SODIUM: 137 mmol/L (ref 135–146)

## 2014-11-03 MED ORDER — ONDANSETRON HCL 8 MG PO TABS
ORAL_TABLET | ORAL | Status: AC
  Filled 2014-11-03: qty 1

## 2014-11-03 MED ORDER — ONDANSETRON HCL 8 MG PO TABS
8.0000 mg | ORAL_TABLET | Freq: Once | ORAL | Status: AC
Start: 2014-11-03 — End: 2014-11-03
  Administered 2014-11-03: 8 mg via ORAL

## 2014-11-03 MED ORDER — BORTEZOMIB CHEMO SQ INJECTION 3.5 MG (2.5MG/ML)
1.3000 mg/m2 | Freq: Once | INTRAMUSCULAR | Status: AC
  Administered 2014-11-03: 2.75 mg via SUBCUTANEOUS
  Filled 2014-11-03: qty 2.75

## 2014-11-03 NOTE — Patient Instructions (Signed)
Bortezomib injection What is this medicine? BORTEZOMIB (bor TEZ oh mib) is a chemotherapy drug. It slows the growth of cancer cells. This medicine is used to treat multiple myeloma, and certain lymphomas, such as mantle-cell lymphoma. This medicine may be used for other purposes; ask your health care provider or pharmacist if you have questions. COMMON BRAND NAME(S): Velcade What should I tell my health care provider before I take this medicine? They need to know if you have any of these conditions: -diabetes -heart disease -irregular heartbeat -liver disease -on hemodialysis -low blood counts, like low white blood cells, platelets, or hemoglobin -peripheral neuropathy -taking medicine for blood pressure -an unusual or allergic reaction to bortezomib, mannitol, boron, other medicines, foods, dyes, or preservatives -pregnant or trying to get pregnant -breast-feeding How should I use this medicine? This medicine is for injection into a vein or for injection under the skin. It is given by a health care professional in a hospital or clinic setting. Talk to your pediatrician regarding the use of this medicine in children. Special care may be needed. Overdosage: If you think you have taken too much of this medicine contact a poison control center or emergency room at once. NOTE: This medicine is only for you. Do not share this medicine with others. What if I miss a dose? It is important not to miss your dose. Call your doctor or health care professional if you are unable to keep an appointment. What may interact with this medicine? This medicine may interact with the following medications: -ketoconazole -rifampin -ritonavir -St. John's Wort This list may not describe all possible interactions. Give your health care provider a list of all the medicines, herbs, non-prescription drugs, or dietary supplements you use. Also tell them if you smoke, drink alcohol, or use illegal drugs. Some items  may interact with your medicine. What should I watch for while using this medicine? Visit your doctor for checks on your progress. This drug may make you feel generally unwell. This is not uncommon, as chemotherapy can affect healthy cells as well as cancer cells. Report any side effects. Continue your course of treatment even though you feel ill unless your doctor tells you to stop. You may get drowsy or dizzy. Do not drive, use machinery, or do anything that needs mental alertness until you know how this medicine affects you. Do not stand or sit up quickly, especially if you are an older patient. This reduces the risk of dizzy or fainting spells. In some cases, you may be given additional medicines to help with side effects. Follow all directions for their use. Call your doctor or health care professional for advice if you get a fever, chills or sore throat, or other symptoms of a cold or flu. Do not treat yourself. This drug decreases your body's ability to fight infections. Try to avoid being around people who are sick. This medicine may increase your risk to bruise or bleed. Call your doctor or health care professional if you notice any unusual bleeding. You may need blood work done while you are taking this medicine. In some patients, this medicine may cause a serious brain infection that may cause death. If you have any problems seeing, thinking, speaking, walking, or standing, tell your doctor right away. If you cannot reach your doctor, urgently seek other source of medical care. Do not become pregnant while taking this medicine. Women should inform their doctor if they wish to become pregnant or think they might be pregnant. There is   a potential for serious side effects to an unborn child. Talk to your health care professional or pharmacist for more information. Do not breast-feed an infant while taking this medicine. Check with your doctor or health care professional if you get an attack of  severe diarrhea, nausea and vomiting, or if you sweat a lot. The loss of too much body fluid can make it dangerous for you to take this medicine. What side effects may I notice from receiving this medicine? Side effects that you should report to your doctor or health care professional as soon as possible: -allergic reactions like skin rash, itching or hives, swelling of the face, lips, or tongue -breathing problems -changes in hearing -changes in vision -fast, irregular heartbeat -feeling faint or lightheaded, falls -pain, tingling, numbness in the hands or feet -right upper belly pain -seizures -swelling of the ankles, feet, hands -unusual bleeding or bruising -unusually weak or tired -vomiting -yellowing of the eyes or skin Side effects that usually do not require medical attention (report to your doctor or health care professional if they continue or are bothersome): -changes in emotions or moods -constipation -diarrhea -loss of appetite -headache -irritation at site where injected -nausea This list may not describe all possible side effects. Call your doctor for medical advice about side effects. You may report side effects to FDA at 1-800-FDA-1088. Where should I keep my medicine? This drug is given in a hospital or clinic and will not be stored at home. NOTE: This sheet is a summary. It may not cover all possible information. If you have questions about this medicine, talk to your doctor, pharmacist, or health care provider.  2015, [employer]vier/Gold Standard. (2012-11-26 12:46:32)  

## 2014-11-06 ENCOUNTER — Encounter: Payer: Self-pay | Admitting: Hematology & Oncology

## 2014-11-10 ENCOUNTER — Other Ambulatory Visit: Payer: Medicare Other

## 2014-11-10 ENCOUNTER — Ambulatory Visit (HOSPITAL_BASED_OUTPATIENT_CLINIC_OR_DEPARTMENT_OTHER): Payer: Medicare Other

## 2014-11-10 VITALS — BP 132/60 | HR 57 | Temp 97.6°F | Resp 16

## 2014-11-10 DIAGNOSIS — C9000 Multiple myeloma not having achieved remission: Secondary | ICD-10-CM

## 2014-11-10 DIAGNOSIS — Z5112 Encounter for antineoplastic immunotherapy: Secondary | ICD-10-CM

## 2014-11-10 MED ORDER — BORTEZOMIB CHEMO SQ INJECTION 3.5 MG (2.5MG/ML)
1.3000 mg/m2 | Freq: Once | INTRAMUSCULAR | Status: AC
  Administered 2014-11-10: 2.75 mg via SUBCUTANEOUS
  Filled 2014-11-10: qty 2.75

## 2014-11-10 MED ORDER — ONDANSETRON HCL 8 MG PO TABS
8.0000 mg | ORAL_TABLET | Freq: Once | ORAL | Status: AC
  Administered 2014-11-10: 8 mg via ORAL

## 2014-11-10 MED ORDER — ONDANSETRON HCL 8 MG PO TABS
ORAL_TABLET | ORAL | Status: AC
  Filled 2014-11-10: qty 1

## 2014-11-10 NOTE — Patient Instructions (Signed)
Bortezomib injection What is this medicine? BORTEZOMIB (bor TEZ oh mib) is a chemotherapy drug. It slows the growth of cancer cells. This medicine is used to treat multiple myeloma, and certain lymphomas, such as mantle-cell lymphoma. This medicine may be used for other purposes; ask your health care provider or pharmacist if you have questions. COMMON BRAND NAME(S): Velcade What should I tell my health care provider before I take this medicine? They need to know if you have any of these conditions: -diabetes -heart disease -irregular heartbeat -liver disease -on hemodialysis -low blood counts, like low white blood cells, platelets, or hemoglobin -peripheral neuropathy -taking medicine for blood pressure -an unusual or allergic reaction to bortezomib, mannitol, boron, other medicines, foods, dyes, or preservatives -pregnant or trying to get pregnant -breast-feeding How should I use this medicine? This medicine is for injection into a vein or for injection under the skin. It is given by a health care professional in a hospital or clinic setting. Talk to your pediatrician regarding the use of this medicine in children. Special care may be needed. Overdosage: If you think you have taken too much of this medicine contact a poison control center or emergency room at once. NOTE: This medicine is only for you. Do not share this medicine with others. What if I miss a dose? It is important not to miss your dose. Call your doctor or health care professional if you are unable to keep an appointment. What may interact with this medicine? This medicine may interact with the following medications: -ketoconazole -rifampin -ritonavir -St. John's Wort This list may not describe all possible interactions. Give your health care provider a list of all the medicines, herbs, non-prescription drugs, or dietary supplements you use. Also tell them if you smoke, drink alcohol, or use illegal drugs. Some items  may interact with your medicine. What should I watch for while using this medicine? Visit your doctor for checks on your progress. This drug may make you feel generally unwell. This is not uncommon, as chemotherapy can affect healthy cells as well as cancer cells. Report any side effects. Continue your course of treatment even though you feel ill unless your doctor tells you to stop. You may get drowsy or dizzy. Do not drive, use machinery, or do anything that needs mental alertness until you know how this medicine affects you. Do not stand or sit up quickly, especially if you are an older patient. This reduces the risk of dizzy or fainting spells. In some cases, you may be given additional medicines to help with side effects. Follow all directions for their use. Call your doctor or health care professional for advice if you get a fever, chills or sore throat, or other symptoms of a cold or flu. Do not treat yourself. This drug decreases your body's ability to fight infections. Try to avoid being around people who are sick. This medicine may increase your risk to bruise or bleed. Call your doctor or health care professional if you notice any unusual bleeding. You may need blood work done while you are taking this medicine. In some patients, this medicine may cause a serious brain infection that may cause death. If you have any problems seeing, thinking, speaking, walking, or standing, tell your doctor right away. If you cannot reach your doctor, urgently seek other source of medical care. Do not become pregnant while taking this medicine. Women should inform their doctor if they wish to become pregnant or think they might be pregnant. There is   a potential for serious side effects to an unborn child. Talk to your health care professional or pharmacist for more information. Do not breast-feed an infant while taking this medicine. Check with your doctor or health care professional if you get an attack of  severe diarrhea, nausea and vomiting, or if you sweat a lot. The loss of too much body fluid can make it dangerous for you to take this medicine. What side effects may I notice from receiving this medicine? Side effects that you should report to your doctor or health care professional as soon as possible: -allergic reactions like skin rash, itching or hives, swelling of the face, lips, or tongue -breathing problems -changes in hearing -changes in vision -fast, irregular heartbeat -feeling faint or lightheaded, falls -pain, tingling, numbness in the hands or feet -right upper belly pain -seizures -swelling of the ankles, feet, hands -unusual bleeding or bruising -unusually weak or tired -vomiting -yellowing of the eyes or skin Side effects that usually do not require medical attention (report to your doctor or health care professional if they continue or are bothersome): -changes in emotions or moods -constipation -diarrhea -loss of appetite -headache -irritation at site where injected -nausea This list may not describe all possible side effects. Call your doctor for medical advice about side effects. You may report side effects to FDA at 1-800-FDA-1088. Where should I keep my medicine? This drug is given in a hospital or clinic and will not be stored at home. NOTE: This sheet is a summary. It may not cover all possible information. If you have questions about this medicine, talk to your doctor, pharmacist, or health care provider.  2015, [employer]vier/Gold Standard. (2012-11-26 12:46:32)  

## 2014-11-13 ENCOUNTER — Other Ambulatory Visit: Payer: Self-pay

## 2014-11-13 DIAGNOSIS — C9000 Multiple myeloma not having achieved remission: Secondary | ICD-10-CM

## 2014-11-13 MED ORDER — LENALIDOMIDE 25 MG PO CAPS: ORAL_CAPSULE | ORAL | Status: DC

## 2014-11-14 ENCOUNTER — Other Ambulatory Visit: Payer: Self-pay | Admitting: *Deleted

## 2014-11-24 ENCOUNTER — Ambulatory Visit (HOSPITAL_BASED_OUTPATIENT_CLINIC_OR_DEPARTMENT_OTHER): Payer: Medicare Other

## 2014-11-24 ENCOUNTER — Other Ambulatory Visit: Payer: Medicare Other

## 2014-11-24 ENCOUNTER — Ambulatory Visit (HOSPITAL_BASED_OUTPATIENT_CLINIC_OR_DEPARTMENT_OTHER): Payer: Medicare Other | Admitting: Hematology & Oncology

## 2014-11-24 ENCOUNTER — Encounter: Payer: Self-pay | Admitting: Hematology & Oncology

## 2014-11-24 VITALS — BP 121/56 | HR 58 | Temp 97.8°F | Resp 18 | Ht 72.0 in | Wt 204.0 lb

## 2014-11-24 DIAGNOSIS — I82401 Acute embolism and thrombosis of unspecified deep veins of right lower extremity: Secondary | ICD-10-CM | POA: Diagnosis not present

## 2014-11-24 DIAGNOSIS — I82441 Acute embolism and thrombosis of right tibial vein: Secondary | ICD-10-CM

## 2014-11-24 DIAGNOSIS — Z5112 Encounter for antineoplastic immunotherapy: Secondary | ICD-10-CM | POA: Diagnosis not present

## 2014-11-24 DIAGNOSIS — C9002 Multiple myeloma in relapse: Secondary | ICD-10-CM

## 2014-11-24 DIAGNOSIS — C9000 Multiple myeloma not having achieved remission: Secondary | ICD-10-CM

## 2014-11-24 LAB — CBC WITH DIFFERENTIAL (CANCER CENTER ONLY)
BASO#: 0.1 10*3/uL (ref 0.0–0.2)
BASO%: 2.8 % — ABNORMAL HIGH (ref 0.0–2.0)
EOS ABS: 0.1 10*3/uL (ref 0.0–0.5)
EOS%: 2.3 % (ref 0.0–7.0)
HCT: 32 % — ABNORMAL LOW (ref 38.7–49.9)
HEMOGLOBIN: 10.3 g/dL — AB (ref 13.0–17.1)
LYMPH#: 1.4 10*3/uL (ref 0.9–3.3)
LYMPH%: 28.8 % (ref 14.0–48.0)
MCH: 28.1 pg (ref 28.0–33.4)
MCHC: 32.2 g/dL (ref 32.0–35.9)
MCV: 87 fL (ref 82–98)
MONO#: 1.1 10*3/uL — ABNORMAL HIGH (ref 0.1–0.9)
MONO%: 22.5 % — AB (ref 0.0–13.0)
NEUT%: 43.6 % (ref 40.0–80.0)
NEUTROS ABS: 2.1 10*3/uL (ref 1.5–6.5)
PLATELETS: 281 10*3/uL (ref 145–400)
RBC: 3.66 10*6/uL — AB (ref 4.20–5.70)
RDW: 16.8 % — ABNORMAL HIGH (ref 11.1–15.7)
WBC: 4.7 10*3/uL (ref 4.0–10.0)

## 2014-11-24 LAB — CMP (CANCER CENTER ONLY)
ALK PHOS: 101 U/L — AB (ref 26–84)
ALT: 26 U/L (ref 10–47)
AST: 22 U/L (ref 11–38)
Albumin: 3 g/dL — ABNORMAL LOW (ref 3.3–5.5)
BUN: 13 mg/dL (ref 7–22)
CHLORIDE: 104 meq/L (ref 98–108)
CO2: 28 mEq/L (ref 18–33)
CREATININE: 1.3 mg/dL — AB (ref 0.6–1.2)
Calcium: 8.7 mg/dL (ref 8.0–10.3)
Glucose, Bld: 132 mg/dL — ABNORMAL HIGH (ref 73–118)
POTASSIUM: 3.2 meq/L — AB (ref 3.3–4.7)
Sodium: 137 mEq/L (ref 128–145)
TOTAL PROTEIN: 6.2 g/dL — AB (ref 6.4–8.1)
Total Bilirubin: 0.7 mg/dl (ref 0.20–1.60)

## 2014-11-24 LAB — LACTATE DEHYDROGENASE: LDH: 145 U/L (ref 94–250)

## 2014-11-24 MED ORDER — ZOLEDRONIC ACID 4 MG/100ML IV SOLN
4.0000 mg | Freq: Once | INTRAVENOUS | Status: AC
  Administered 2014-11-24: 4 mg via INTRAVENOUS
  Filled 2014-11-24: qty 100

## 2014-11-24 MED ORDER — BORTEZOMIB CHEMO SQ INJECTION 3.5 MG (2.5MG/ML)
1.3000 mg/m2 | Freq: Once | INTRAMUSCULAR | Status: AC
Start: 2014-11-24 — End: 2014-11-24
  Administered 2014-11-24: 2.75 mg via SUBCUTANEOUS
  Filled 2014-11-24: qty 2.75

## 2014-11-24 MED ORDER — ONDANSETRON HCL 8 MG PO TABS
8.0000 mg | ORAL_TABLET | Freq: Once | ORAL | Status: AC
  Administered 2014-11-24: 8 mg via ORAL

## 2014-11-24 MED ORDER — ONDANSETRON HCL 8 MG PO TABS
ORAL_TABLET | ORAL | Status: AC
  Filled 2014-11-24: qty 1

## 2014-11-24 MED ORDER — SODIUM CHLORIDE 0.9 % IV SOLN
Freq: Once | INTRAVENOUS | Status: AC
  Administered 2014-11-24: 11:00:00 via INTRAVENOUS

## 2014-11-24 NOTE — Progress Notes (Signed)
Hematology and Oncology Follow Up Visit  [name] [name] [id number] [birth year] 74 y.o. 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  he looks quite good. He feels good. His right leg is no longer bothering him. He had a DVT in that leg. He is on Xarelto right now.  He's done well with the chemotherapy. His myeloma studies have improved. Back in September, his M spike was 0.8 g/dL. His IgG level was 988 mg/dL in his Kappa Lightchain was 12.5 mg/dL.  Overall, he is tolerated treatment quite nicely. He's had no bleeding. He has had an occasional upper respiratory infection. He take some over-the-counter medications for this.  He's had no rashes. He's had no fever. He's had no change in bowel or bladder habit  Overall, his performance status is ECOG 1.    Medications:  Current outpatient prescriptions:  .  amLODipine (NORVASC) 5 MG tablet, Take 1 tablet (5 mg total) by mouth daily., Disp: 90 tablet, Rfl: 3 .  atorvastatin (LIPITOR) 20 MG tablet, Take 1 tablet (20 mg total) by mouth daily., Disp: 90 tablet, Rfl: 3 .  baclofen (LIORESAL) 10 MG tablet, , Disp: , Rfl:  .  carvedilol (COREG) 6.25 MG tablet, Take 1 tablet (6.25 mg total) by mouth 2 (two) times daily with a meal., Disp: 180 tablet, Rfl: 3 .  clopidogrel (PLAVIX) 75 MG tablet, Take 1 tablet (75 mg total) by mouth daily. 90 Day Supply, Disp: 90 tablet, Rfl: 3 .  dexamethasone (DECADRON) 4 MG tablet, Take 5 pills, with food, once a week. (Patient taking differently: Take 3 pills, with food, once a week.), Disp: 100 tablet, Rfl: 3 .  esomeprazole (NEXIUM) 40 MG capsule, Take 40 mg by mouth daily at 12 noon., Disp: , Rfl:  .  famciclovir (FAMVIR) 500 MG tablet, Take 1  tablet (500 mg total) by mouth daily., Disp: 90 tablet, Rfl: 3 .  famotidine (PEPCID) 20 MG tablet, Take 1 tablet (20 mg total) by mouth 2 (two) times daily., Disp: 60 tablet, Rfl: 3 .  hydrochlorothiazide (MICROZIDE) 12.5 MG capsule, Take 1 capsule (12.5 mg total) by mouth daily., Disp: 90 capsule, Rfl: 3 .  isosorbide mononitrate (IMDUR) 30 MG 24 hr tablet, Take 1 tablet (30 mg total) by mouth 2 (two) times daily. (Patient taking differently: Take 30 mg by mouth 2 (two) times daily. PATIENT TAKES 60 MG DAILY), Disp: 180 tablet, Rfl: 3 .  lenalidomide (REVLIMID) 25 MG capsule, Take 1 pill a day at bedtime for 21 days and then stop for 7 days. ENID#[id number], Disp: 21 capsule, Rfl: 0 .  LORazepam (ATIVAN) 1 MG tablet, Take 1.5 tablets (1.5 mg total) by mouth at bedtime., Disp: 135 tablet, Rfl: 0 .  montelukast (SINGULAIR) 10 MG tablet, Take 1 tablet (10 mg total) by mouth at bedtime., Disp: 90 tablet, Rfl: 3 .  nitroGLYCERIN (NITROSTAT) 0.4 MG SL tablet, Place 0.4 mg under the tongue every 5 (five) minutes as needed. For chest pain, Disp: , Rfl:  .  ondansetron (ZOFRAN) 8 MG tablet, Take 1 tablet (8 mg total) by mouth 2 (two) times daily. Start the day after chemo for 2 days. Then take as  needed for nausea or vomiting., Disp: 30 tablet, Rfl: 1 .  potassium chloride SA (K-DUR,KLOR-CON) 20 MEQ tablet, Take 2 tablets (40 mEq total) by mouth 2 (two) times daily. For 5 days then 40 meq daily, Disp: 90 tablet, Rfl: 2 .  prochlorperazine (COMPAZINE) 10 MG tablet, Take 1 tablet (10 mg total) by mouth every 6 (six) hours as needed (Nausea or vomiting)., Disp: 30 tablet, Rfl: 1 .  pyridOXINE (VITAMIN B-6) 50 MG tablet, Take 1 tablet (50 mg total) by mouth daily., Disp: 90 tablet, Rfl: 3 .  rivaroxaban (XARELTO) 20 MG TABS tablet, Take 1 tablet (20 mg total) by mouth daily with supper. 90 day supply, Disp: 90 tablet, Rfl: 3 .  simvastatin (ZOCOR) 20 MG tablet, Take 1 tablet (20 mg total) by mouth every evening.,  Disp: 90 tablet, Rfl: 3 .  traMADol (ULTRAM) 50 MG tablet, Take 1 tablet (50 mg total) by mouth every 6 (six) hours as needed for moderate pain., Disp: 90 tablet, Rfl: 2 .  triamcinolone cream (KENALOG) 0.1 %, , Disp: , Rfl:  No current facility-administered medications for this visit.  Facility-Administered Medications Ordered in Other Visits:  .  0.9 %  sodium chloride infusion, , Intravenous, Once, [name], MD .  bortezomib SQ (VELCADE) chemo injection 2.75 mg, 1.3 mg/m2 (Treatment Plan Actual), Subcutaneous, Once, [name], MD .  ondansetron [facility]) tablet 8 mg, 8 mg, Oral, Once, [name], MD .  zolendronic acid (ZOMETA) 4 mg in sodium chloride 0.9 % 100 mL IVPB, 4 mg, Intravenous, Once, [name], MD  Allergies:  Allergies  Allergen Reactions  . Hydrocodone-Acetaminophen Itching  . Other Itching    Flu-like symptoms from portabello mushrooms  . Codeine Itching  . Mushroom Extract Complex Nausea And Vomiting    Only a certain type of mushroom (name unknown) causes this reaction.    Past Medical History, Surgical history, Social history, and Family History were reviewed and updated.  Review of Systems: As above  Physical Exam:  height is 6' (1.829 m) and weight is 204 lb (92.534 kg). His oral temperature is 97.8 F (36.6 C). His blood pressure is 121/56 and his pulse is 58. His respiration is 18.   Wt Readings from Last 3 Encounters:  11/24/14 204 lb (92.534 kg)  10/27/14 205 lb (92.987 kg)  10/10/14 202 lb (91.627 kg)     Well-developed and well-nourished white woman in no obvious distress. Head and neck exam shows no ocular or oral lesions. There are no palpable cervical or supraclavicular lymph nodes. Lungs are clear. Cardiac exam regular rate and rhythm with no murmurs, rubs or bruits. Abdomen is soft. He has good bowel sounds. There is no fluid wave. There is no palpable liver or spleen tip. Back exam shows no tenderness over the spine, ribs  or hips. He is wearing a back brace. Extremities shows no clubbing, cyanosis or edema. There might be some slight swelling of the right lower leg. No obvious venous cord is noted. He has a negative Homans sign with the right leg. He has good pulses in his distal extremities. Neurological exam is nonfocal. Skin exam shows no rashes, ecchymoses or petechia.   Impression and Plan: [name] [name] is 74 year old male. He has IgG kappa myeloma. He has had  3 cycles  of chemotherapy.  Again, he is responding.  His myeloma studies look better.  We'll proceed with his fourth cycle of treatment. I may want to consider another bone  marrow test on him.  Given his age, he is certainly borderline for a transplant.  I want to check another Doppler of his right leg..   We will  plan to see him back in one more month.    [name], MD 10/10/201610:45 AM

## 2014-11-25 ENCOUNTER — Telehealth: Payer: Self-pay | Admitting: *Deleted

## 2014-11-25 NOTE — Telephone Encounter (Addendum)
Left message on personal voice mail.   ----- Message from [name], MD sent at 11/25/2014  1:22 PM EDT ----- Call - myeloma still coming down nicely!!  [name] [name]

## 2014-11-26 ENCOUNTER — Other Ambulatory Visit: Payer: Medicare Other

## 2014-11-26 DIAGNOSIS — C9000 Multiple myeloma not having achieved remission: Secondary | ICD-10-CM | POA: Diagnosis not present

## 2014-11-26 LAB — IFE INTERPRETATION

## 2014-11-26 LAB — PROTEIN ELECTROPHORESIS, SERUM, WITH REFLEX
ALPHA-1-GLOBULIN: 0.4 g/dL — AB (ref 0.2–0.3)
ALPHA-2-GLOBULIN: 0.8 g/dL (ref 0.5–0.9)
Abnormal Protein Band1: 0.4 g/dL
Albumin ELP: 3.1 g/dL — ABNORMAL LOW (ref 3.8–4.8)
BETA 2: 0.2 g/dL (ref 0.2–0.5)
BETA GLOBULIN: 0.8 g/dL — AB (ref 0.4–0.6)
GAMMA GLOBULIN: 0.4 g/dL — AB (ref 0.8–1.7)
Total Protein, Serum Electrophoresis: 5.7 g/dL — ABNORMAL LOW (ref 6.1–8.1)

## 2014-11-26 LAB — IGG, IGA, IGM
IGA: 65 mg/dL — AB (ref 68–379)
IgG (Immunoglobin G), Serum: 768 mg/dL (ref 650–1600)
IgM, Serum: 21 mg/dL — ABNORMAL LOW (ref 41–251)

## 2014-11-26 LAB — KAPPA/LAMBDA LIGHT CHAINS
KAPPA LAMBDA RATIO: 3.03 — AB (ref 0.26–1.65)
Kappa free light chain: 3.52 mg/dL — ABNORMAL HIGH (ref 0.33–1.94)
Lambda Free Lght Chn: 1.16 mg/dL (ref 0.57–2.63)

## 2014-12-01 ENCOUNTER — Ambulatory Visit (HOSPITAL_BASED_OUTPATIENT_CLINIC_OR_DEPARTMENT_OTHER): Payer: Medicare Other

## 2014-12-01 ENCOUNTER — Other Ambulatory Visit: Payer: Medicare Other

## 2014-12-01 VITALS — BP 114/62 | HR 61 | Temp 97.8°F | Resp 18

## 2014-12-01 DIAGNOSIS — Z5112 Encounter for antineoplastic immunotherapy: Secondary | ICD-10-CM | POA: Diagnosis not present

## 2014-12-01 DIAGNOSIS — C9000 Multiple myeloma not having achieved remission: Secondary | ICD-10-CM

## 2014-12-01 DIAGNOSIS — Z23 Encounter for immunization: Secondary | ICD-10-CM | POA: Diagnosis not present

## 2014-12-01 MED ORDER — ONDANSETRON HCL 8 MG PO TABS
ORAL_TABLET | ORAL | Status: AC
  Filled 2014-12-01: qty 1

## 2014-12-01 MED ORDER — INFLUENZA VAC SPLIT QUAD 0.5 ML IM SUSY
0.5000 mL | PREFILLED_SYRINGE | Freq: Once | INTRAMUSCULAR | Status: AC
  Administered 2014-12-01: 0.5 mL via INTRAMUSCULAR
  Filled 2014-12-01: qty 0.5

## 2014-12-01 MED ORDER — ONDANSETRON HCL 8 MG PO TABS
8.0000 mg | ORAL_TABLET | Freq: Once | ORAL | Status: AC
  Administered 2014-12-01: 8 mg via ORAL

## 2014-12-01 MED ORDER — BORTEZOMIB CHEMO SQ INJECTION 3.5 MG (2.5MG/ML)
1.3000 mg/m2 | Freq: Once | INTRAMUSCULAR | Status: AC
  Administered 2014-12-01: 2.75 mg via SUBCUTANEOUS
  Filled 2014-12-01: qty 2.75

## 2014-12-01 NOTE — Patient Instructions (Signed)
Bortezomib injection What is this medicine? BORTEZOMIB (bor TEZ oh mib) is a chemotherapy drug. It slows the growth of cancer cells. This medicine is used to treat multiple myeloma, and certain lymphomas, such as mantle-cell lymphoma. This medicine may be used for other purposes; ask your health care provider or pharmacist if you have questions. COMMON BRAND NAME(S): Velcade What should I tell my health care provider before I take this medicine? They need to know if you have any of these conditions: -diabetes -heart disease -irregular heartbeat -liver disease -on hemodialysis -low blood counts, like low white blood cells, platelets, or hemoglobin -peripheral neuropathy -taking medicine for blood pressure -an unusual or allergic reaction to bortezomib, mannitol, boron, other medicines, foods, dyes, or preservatives -pregnant or trying to get pregnant -breast-feeding How should I use this medicine? This medicine is for injection into a vein or for injection under the skin. It is given by a health care professional in a hospital or clinic setting. Talk to your pediatrician regarding the use of this medicine in children. Special care may be needed. Overdosage: If you think you have taken too much of this medicine contact a poison control center or emergency room at once. NOTE: This medicine is only for you. Do not share this medicine with others. What if I miss a dose? It is important not to miss your dose. Call your doctor or health care professional if you are unable to keep an appointment. What may interact with this medicine? This medicine may interact with the following medications: -ketoconazole -rifampin -ritonavir -St. John's Wort This list may not describe all possible interactions. Give your health care provider a list of all the medicines, herbs, non-prescription drugs, or dietary supplements you use. Also tell them if you smoke, drink alcohol, or use illegal drugs. Some items  may interact with your medicine. What should I watch for while using this medicine? Visit your doctor for checks on your progress. This drug may make you feel generally unwell. This is not uncommon, as chemotherapy can affect healthy cells as well as cancer cells. Report any side effects. Continue your course of treatment even though you feel ill unless your doctor tells you to stop. You may get drowsy or dizzy. Do not drive, use machinery, or do anything that needs mental alertness until you know how this medicine affects you. Do not stand or sit up quickly, especially if you are an older patient. This reduces the risk of dizzy or fainting spells. In some cases, you may be given additional medicines to help with side effects. Follow all directions for their use. Call your doctor or health care professional for advice if you get a fever, chills or sore throat, or other symptoms of a cold or flu. Do not treat yourself. This drug decreases your body's ability to fight infections. Try to avoid being around people who are sick. This medicine may increase your risk to bruise or bleed. Call your doctor or health care professional if you notice any unusual bleeding. You may need blood work done while you are taking this medicine. In some patients, this medicine may cause a serious brain infection that may cause death. If you have any problems seeing, thinking, speaking, walking, or standing, tell your doctor right away. If you cannot reach your doctor, urgently seek other source of medical care. Do not become pregnant while taking this medicine. Women should inform their doctor if they wish to become pregnant or think they might be pregnant. There is   a potential for serious side effects to an unborn child. Talk to your health care professional or pharmacist for more information. Do not breast-feed an infant while taking this medicine. Check with your doctor or health care professional if you get an attack of  severe diarrhea, nausea and vomiting, or if you sweat a lot. The loss of too much body fluid can make it dangerous for you to take this medicine. What side effects may I notice from receiving this medicine? Side effects that you should report to your doctor or health care professional as soon as possible: -allergic reactions like skin rash, itching or hives, swelling of the face, lips, or tongue -breathing problems -changes in hearing -changes in vision -fast, irregular heartbeat -feeling faint or lightheaded, falls -pain, tingling, numbness in the hands or feet -right upper belly pain -seizures -swelling of the ankles, feet, hands -unusual bleeding or bruising -unusually weak or tired -vomiting -yellowing of the eyes or skin Side effects that usually do not require medical attention (report to your doctor or health care professional if they continue or are bothersome): -changes in emotions or moods -constipation -diarrhea -loss of appetite -headache -irritation at site where injected -nausea This list may not describe all possible side effects. Call your doctor for medical advice about side effects. You may report side effects to FDA at 1-800-FDA-1088. Where should I keep my medicine? This drug is given in a hospital or clinic and will not be stored at home. NOTE: This sheet is a summary. It may not cover all possible information. If you have questions about this medicine, talk to your doctor, pharmacist, or health care provider.  2015, [employer]vier/Gold Standard. (2012-11-26 12:46:32)  

## 2014-12-05 LAB — 24 HR URINE,KAPPA/LAMBDA LIGHT CHAINS
MEASURED KAPPA CHAIN: 1.92 mg/dL (ref <2.00)
Measured Lambda Chain: 0.38 mg/dL (ref <2.00)
Total Kappa Chain: 57.6 mg/24 h
Total Lambda Chain: 11.4 mg/24 h
URINE VOLUME: 3000 mL/(24.h)

## 2014-12-05 LAB — UIFE/LIGHT CHAINS/TP QN, 24-HR UR
ALPHA 1 UR: DETECTED — AB
ALPHA 2 UR: DETECTED — AB
Albumin, U: DETECTED
Beta, Urine: DETECTED — AB
GAMMA UR: DETECTED — AB
TIME-UPE24: 24 h
Total Protein, Urine: 28 mg/dL — ABNORMAL HIGH (ref 5–25)
Volume, Urine: 3000 mL

## 2014-12-08 ENCOUNTER — Other Ambulatory Visit: Payer: Medicare Other

## 2014-12-08 ENCOUNTER — Ambulatory Visit (HOSPITAL_BASED_OUTPATIENT_CLINIC_OR_DEPARTMENT_OTHER): Payer: Medicare Other

## 2014-12-08 VITALS — BP 113/59 | HR 59 | Temp 97.9°F | Resp 18

## 2014-12-08 DIAGNOSIS — Z5112 Encounter for antineoplastic immunotherapy: Secondary | ICD-10-CM

## 2014-12-08 DIAGNOSIS — C9000 Multiple myeloma not having achieved remission: Secondary | ICD-10-CM

## 2014-12-08 MED ORDER — ONDANSETRON HCL 8 MG PO TABS
ORAL_TABLET | ORAL | Status: AC
  Filled 2014-12-08: qty 1

## 2014-12-08 MED ORDER — BORTEZOMIB CHEMO SQ INJECTION 3.5 MG (2.5MG/ML)
1.3000 mg/m2 | Freq: Once | INTRAMUSCULAR | Status: AC
  Administered 2014-12-08: 2.75 mg via SUBCUTANEOUS
  Filled 2014-12-08: qty 2.75

## 2014-12-08 MED ORDER — ONDANSETRON HCL 8 MG PO TABS
8.0000 mg | ORAL_TABLET | Freq: Once | ORAL | Status: AC
  Administered 2014-12-08: 8 mg via ORAL

## 2014-12-08 NOTE — Patient Instructions (Signed)
Bortezomib injection What is this medicine? BORTEZOMIB (bor TEZ oh mib) is a chemotherapy drug. It slows the growth of cancer cells. This medicine is used to treat multiple myeloma, and certain lymphomas, such as mantle-cell lymphoma. This medicine may be used for other purposes; ask your health care provider or pharmacist if you have questions. COMMON BRAND NAME(S): Velcade What should I tell my health care provider before I take this medicine? They need to know if you have any of these conditions: -diabetes -heart disease -irregular heartbeat -liver disease -on hemodialysis -low blood counts, like low white blood cells, platelets, or hemoglobin -peripheral neuropathy -taking medicine for blood pressure -an unusual or allergic reaction to bortezomib, mannitol, boron, other medicines, foods, dyes, or preservatives -pregnant or trying to get pregnant -breast-feeding How should I use this medicine? This medicine is for injection into a vein or for injection under the skin. It is given by a health care professional in a hospital or clinic setting. Talk to your pediatrician regarding the use of this medicine in children. Special care may be needed. Overdosage: If you think you have taken too much of this medicine contact a poison control center or emergency room at once. NOTE: This medicine is only for you. Do not share this medicine with others. What if I miss a dose? It is important not to miss your dose. Call your doctor or health care professional if you are unable to keep an appointment. What may interact with this medicine? This medicine may interact with the following medications: -ketoconazole -rifampin -ritonavir -St. John's Wort This list may not describe all possible interactions. Give your health care provider a list of all the medicines, herbs, non-prescription drugs, or dietary supplements you use. Also tell them if you smoke, drink alcohol, or use illegal drugs. Some items  may interact with your medicine. What should I watch for while using this medicine? Visit your doctor for checks on your progress. This drug may make you feel generally unwell. This is not uncommon, as chemotherapy can affect healthy cells as well as cancer cells. Report any side effects. Continue your course of treatment even though you feel ill unless your doctor tells you to stop. You may get drowsy or dizzy. Do not drive, use machinery, or do anything that needs mental alertness until you know how this medicine affects you. Do not stand or sit up quickly, especially if you are an older patient. This reduces the risk of dizzy or fainting spells. In some cases, you may be given additional medicines to help with side effects. Follow all directions for their use. Call your doctor or health care professional for advice if you get a fever, chills or sore throat, or other symptoms of a cold or flu. Do not treat yourself. This drug decreases your body's ability to fight infections. Try to avoid being around people who are sick. This medicine may increase your risk to bruise or bleed. Call your doctor or health care professional if you notice any unusual bleeding. You may need blood work done while you are taking this medicine. In some patients, this medicine may cause a serious brain infection that may cause death. If you have any problems seeing, thinking, speaking, walking, or standing, tell your doctor right away. If you cannot reach your doctor, urgently seek other source of medical care. Do not become pregnant while taking this medicine. Women should inform their doctor if they wish to become pregnant or think they might be pregnant. There is   a potential for serious side effects to an unborn child. Talk to your health care professional or pharmacist for more information. Do not breast-feed an infant while taking this medicine. Check with your doctor or health care professional if you get an attack of  severe diarrhea, nausea and vomiting, or if you sweat a lot. The loss of too much body fluid can make it dangerous for you to take this medicine. What side effects may I notice from receiving this medicine? Side effects that you should report to your doctor or health care professional as soon as possible: -allergic reactions like skin rash, itching or hives, swelling of the face, lips, or tongue -breathing problems -changes in hearing -changes in vision -fast, irregular heartbeat -feeling faint or lightheaded, falls -pain, tingling, numbness in the hands or feet -right upper belly pain -seizures -swelling of the ankles, feet, hands -unusual bleeding or bruising -unusually weak or tired -vomiting -yellowing of the eyes or skin Side effects that usually do not require medical attention (report to your doctor or health care professional if they continue or are bothersome): -changes in emotions or moods -constipation -diarrhea -loss of appetite -headache -irritation at site where injected -nausea This list may not describe all possible side effects. Call your doctor for medical advice about side effects. You may report side effects to FDA at 1-800-FDA-1088. Where should I keep my medicine? This drug is given in a hospital or clinic and will not be stored at home. NOTE: This sheet is a summary. It may not cover all possible information. If you have questions about this medicine, talk to your doctor, pharmacist, or health care provider.  2015, [employer]vier/Gold Standard. (2012-11-26 12:46:32)  

## 2014-12-10 ENCOUNTER — Ambulatory Visit (INDEPENDENT_AMBULATORY_CARE_PROVIDER_SITE_OTHER): Payer: Medicare Other | Admitting: Sports Medicine

## 2014-12-10 ENCOUNTER — Encounter (HOSPITAL_BASED_OUTPATIENT_CLINIC_OR_DEPARTMENT_OTHER): Payer: Self-pay

## 2014-12-10 ENCOUNTER — Ambulatory Visit (HOSPITAL_BASED_OUTPATIENT_CLINIC_OR_DEPARTMENT_OTHER)
Admission: RE | Admit: 2014-12-10 | Discharge: 2014-12-10 | Disposition: A | Discharge status: Home / Self Care | Payer: Medicare Other | Source: Ambulatory Visit | Attending: Sports Medicine | Admitting: Sports Medicine

## 2014-12-10 ENCOUNTER — Encounter: Payer: Self-pay | Admitting: Sports Medicine

## 2014-12-10 VITALS — BP 128/54 | HR 60 | Ht 72.0 in | Wt 205.0 lb

## 2014-12-10 DIAGNOSIS — C9000 Multiple myeloma not having achieved remission: Secondary | ICD-10-CM | POA: Diagnosis not present

## 2014-12-10 DIAGNOSIS — M549 Dorsalgia, unspecified: Secondary | ICD-10-CM | POA: Diagnosis not present

## 2014-12-10 DIAGNOSIS — I82441 Acute embolism and thrombosis of right tibial vein: Secondary | ICD-10-CM | POA: Diagnosis not present

## 2014-12-10 DIAGNOSIS — R071 Chest pain on breathing: Secondary | ICD-10-CM

## 2014-12-10 DIAGNOSIS — K449 Diaphragmatic hernia without obstruction or gangrene: Secondary | ICD-10-CM | POA: Diagnosis not present

## 2014-12-10 DIAGNOSIS — R918 Other nonspecific abnormal finding of lung field: Secondary | ICD-10-CM | POA: Diagnosis not present

## 2014-12-10 DIAGNOSIS — I517 Cardiomegaly: Secondary | ICD-10-CM | POA: Insufficient documentation

## 2014-12-10 DIAGNOSIS — R911 Solitary pulmonary nodule: Secondary | ICD-10-CM

## 2014-12-10 DIAGNOSIS — J479 Bronchiectasis, uncomplicated: Secondary | ICD-10-CM | POA: Insufficient documentation

## 2014-12-10 DIAGNOSIS — I2510 Atherosclerotic heart disease of native coronary artery without angina pectoris: Secondary | ICD-10-CM

## 2014-12-10 MED ORDER — AZITHROMYCIN 250 MG PO TABS: ORAL_TABLET | ORAL | Status: DC

## 2014-12-10 MED ORDER — IOHEXOL 350 MG/ML SOLN
100.0000 mL | Freq: Once | INTRAVENOUS | Status: AC | PRN
  Administered 2014-12-10: 100 mL via INTRAVENOUS

## 2014-12-10 NOTE — Assessment & Plan Note (Signed)
Leg pain and swelling is improving with Xarelto, unfortunately he has developed posterior chest pain, nonpleuritic but not reproducible on exam. Considering his history there is certainly a worry for pulmonary embolism. Stat CT angiogram of the pulmonary arteries, he does also have some cough, and upper respiratory symptoms, considering recent PE as well as current chemotherapy I am going to treat him with azithromycin.

## 2014-12-10 NOTE — Progress Notes (Signed)
  Subjective:    CC: Chest pain  HPI: This is a pleasant 74 year old male, he is currently undergoing chemotherapy for multiple myeloma, he does have a DVT, and has been on Xarelto however recently he started to have some posterior chest pain, really nonpleuritic, but able to be reproduced with palpation. No shortness of breath. He also has some upper respiratory symptoms, no constitutional symptoms.  Past medical history, Surgical history, Family history not pertinant except as noted below, Social history, Allergies, and medications have been entered into the medical record, reviewed, and no changes needed.   Review of Systems: No fevers, chills, night sweats, weight loss, chest pain, or shortness of breath.   Objective:    General: Well Developed, well nourished, and in no acute distress.  Neuro: Alert and oriented x3, extra-ocular muscles intact, sensation grossly intact.  HEENT: Normocephalic, atraumatic, pupils equal round reactive to light, neck supple, no masses, no lymphadenopathy, thyroid nonpalpable.  Skin: Warm and dry, no rashes. Cardiac: Regular rate and rhythm, no murmurs rubs or gallops, no lower extremity edema.  Respiratory: Clear to auscultation bilaterally. Not using accessory muscles, speaking in full sentences. Unable to reproduce pain with palpation over the entire chest wall Legs: Negative Homans sign bilaterally, no swelling.  Impression and Recommendations:    I spent 25 minutes with this patient, greater than 50% was face-to-face time counseling regarding the above diagnoses

## 2014-12-11 DIAGNOSIS — R911 Solitary pulmonary nodule: Secondary | ICD-10-CM | POA: Insufficient documentation

## 2014-12-15 ENCOUNTER — Other Ambulatory Visit: Payer: Self-pay | Admitting: *Deleted

## 2014-12-15 ENCOUNTER — Telehealth: Payer: Self-pay | Admitting: *Deleted

## 2014-12-15 DIAGNOSIS — C9000 Multiple myeloma not having achieved remission: Secondary | ICD-10-CM

## 2014-12-15 MED ORDER — LENALIDOMIDE 25 MG PO CAPS: ORAL_CAPSULE | ORAL | Status: DC

## 2014-12-15 NOTE — Telephone Encounter (Signed)
Patient wants to know if he should continue his revlimid during his 4 week break of Velcade. Spoke to Dr Marin Olp and he does wants patient to continue Revlimid. Patient aware to continue medication.

## 2014-12-24 ENCOUNTER — Encounter: Payer: Self-pay | Admitting: Sports Medicine

## 2014-12-24 ENCOUNTER — Ambulatory Visit (INDEPENDENT_AMBULATORY_CARE_PROVIDER_SITE_OTHER): Payer: Medicare Other | Admitting: Sports Medicine

## 2014-12-24 VITALS — BP 127/68 | HR 64 | Wt 201.0 lb

## 2014-12-24 DIAGNOSIS — M5135 Other intervertebral disc degeneration, thoracolumbar region: Secondary | ICD-10-CM | POA: Insufficient documentation

## 2014-12-24 DIAGNOSIS — R0982 Postnasal drip: Secondary | ICD-10-CM | POA: Insufficient documentation

## 2014-12-24 DIAGNOSIS — I2510 Atherosclerotic heart disease of native coronary artery without angina pectoris: Secondary | ICD-10-CM

## 2014-12-24 DIAGNOSIS — M47815 Spondylosis without myelopathy or radiculopathy, thoracolumbar region: Secondary | ICD-10-CM

## 2014-12-24 MED ORDER — FLUTICASONE PROPIONATE 50 MCG/ACT NA SUSP: NASAL | Status: DC

## 2014-12-24 MED ORDER — OXYCODONE-ACETAMINOPHEN 10-325 MG PO TABS: 1.0000 | ORAL_TABLET | Freq: Two times a day (BID) | ORAL | Status: DC | PRN

## 2014-12-24 MED ORDER — MELOXICAM 15 MG PO TABS: ORAL_TABLET | ORAL | Status: DC

## 2014-12-24 NOTE — Assessment & Plan Note (Signed)
Flonase

## 2014-12-24 NOTE — Assessment & Plan Note (Signed)
Widespread degenerative disc disease and facet arthritis in the thoracolumbar spine, declines physical therapy, we are going to add meloxicam, and Percocet for use twice a day. Home rehabilitation exercises given. Return to see me in one month to see how things are going, he would be a candidate for MRI and interventional injections. He tells me he has had a good response to epidurals in the past.

## 2014-12-24 NOTE — Progress Notes (Signed)
  Subjective:    CC: Follow-up chest pain  HPI: This is a pleasant 74 year old male, he has a history of multiple myeloma currently in remission, he also had a right-sided deep vein thrombosis. At the last visit he started to have some nonpleuritic chest pain, we obtained a CT angiogram that was negative for pulmonary embolism but he did have significant widespread lumbar spondylosis. He continues to have some pain despite antibiotics, that he localizes in the left thoracic/lumbar junction, paralumbar. Worse with flexion and activity. He did have a good response to oxycodone and wonders if we can simply restart this. Pain is moderate, persistent with radiation around the left hemithorax. No lower extremity radiculopathy.  Sore throat: Occurs at night, has significant rhinorrhea. No constitutional symptoms.  Past medical history, Surgical history, Family history not pertinant except as noted below, Social history, Allergies, and medications have been entered into the medical record, reviewed, and no changes needed.   Review of Systems: No fevers, chills, night sweats, weight loss, chest pain, or shortness of breath.   Objective:    General: Well Developed, well nourished, and in no acute distress.  Neuro: Alert and oriented x3, extra-ocular muscles intact, sensation grossly intact.  HEENT: Normocephalic, atraumatic, pupils equal round reactive to light, neck supple, no masses, no lymphadenopathy, thyroid nonpalpable.  Skin: Warm and dry, no rashes. Cardiac: Regular rate and rhythm, no murmurs rubs or gallops, no lower extremity edema.  Respiratory: Clear to auscultation bilaterally. Not using accessory muscles, speaking in full sentences. Back Exam:  Inspection: Unremarkable  Motion: Flexion 45 deg, Extension 45 deg, Side Bending to 45 deg bilaterally,  Rotation to 45 deg bilaterally  SLR laying: Negative  XSLR laying: Negative  Palpable tenderness: Left paralumbar musculature at the  thoracolumbar junction. FABER: negative. Sensory change: Gross sensation intact to all lumbar and sacral dermatomes.  Reflexes: 2+ at both patellar tendons, 2+ at achilles tendons, Babinski's downgoing.  Strength at foot  Plantar-flexion: 5/5 Dorsi-flexion: 5/5 Eversion: 5/5 Inversion: 5/5  Leg strength  Quad: 5/5 Hamstring: 5/5 Hip flexor: 5/5 Hip abductors: 5/5  Gait unremarkable.  Impression and Recommendations:

## 2014-12-26 LAB — RFLX DRVVT CONFRIM: DRVVT CONFIRMATION: POSITIVE — AB

## 2014-12-26 LAB — RFX DRVVT 1:1 MIX

## 2014-12-26 LAB — RFLX HEXAGONAL PHASE CONFIRM: HEXAGONAL PHASE CONFIRM: NEGATIVE

## 2014-12-29 ENCOUNTER — Other Ambulatory Visit: Payer: Medicare Other

## 2014-12-29 ENCOUNTER — Ambulatory Visit: Payer: Medicare Other | Admitting: Hematology & Oncology

## 2014-12-29 ENCOUNTER — Other Ambulatory Visit (HOSPITAL_BASED_OUTPATIENT_CLINIC_OR_DEPARTMENT_OTHER): Payer: Medicare Other

## 2014-12-29 ENCOUNTER — Ambulatory Visit: Payer: Medicare Other

## 2015-01-01 ENCOUNTER — Other Ambulatory Visit: Payer: Self-pay | Admitting: Nurse Practitioner

## 2015-01-01 DIAGNOSIS — C9000 Multiple myeloma not having achieved remission: Secondary | ICD-10-CM

## 2015-01-01 MED ORDER — LENALIDOMIDE 25 MG PO CAPS: ORAL_CAPSULE | ORAL | Status: DC

## 2015-01-05 ENCOUNTER — Ambulatory Visit (HOSPITAL_BASED_OUTPATIENT_CLINIC_OR_DEPARTMENT_OTHER): Payer: Medicare Other

## 2015-01-05 ENCOUNTER — Encounter: Payer: Self-pay | Admitting: Hematology & Oncology

## 2015-01-05 ENCOUNTER — Ambulatory Visit (HOSPITAL_BASED_OUTPATIENT_CLINIC_OR_DEPARTMENT_OTHER): Payer: Medicare Other | Admitting: Hematology & Oncology

## 2015-01-05 ENCOUNTER — Telehealth: Payer: Self-pay | Admitting: *Deleted

## 2015-01-05 ENCOUNTER — Ambulatory Visit (HOSPITAL_BASED_OUTPATIENT_CLINIC_OR_DEPARTMENT_OTHER)
Admission: RE | Admit: 2015-01-05 | Discharge: 2015-01-05 | Disposition: A | Discharge status: Home / Self Care | Payer: Medicare Other | Source: Ambulatory Visit | Attending: Hematology & Oncology | Admitting: Hematology & Oncology

## 2015-01-05 VITALS — BP 120/56 | HR 52 | Temp 97.3°F | Resp 14 | Ht 72.0 in | Wt 195.0 lb

## 2015-01-05 DIAGNOSIS — I83812 Varicose veins of left lower extremities with pain: Secondary | ICD-10-CM | POA: Diagnosis not present

## 2015-01-05 DIAGNOSIS — I82441 Acute embolism and thrombosis of right tibial vein: Secondary | ICD-10-CM

## 2015-01-05 DIAGNOSIS — I2510 Atherosclerotic heart disease of native coronary artery without angina pectoris: Secondary | ICD-10-CM

## 2015-01-05 DIAGNOSIS — Z5112 Encounter for antineoplastic immunotherapy: Secondary | ICD-10-CM | POA: Diagnosis present

## 2015-01-05 DIAGNOSIS — C9001 Multiple myeloma in remission: Secondary | ICD-10-CM

## 2015-01-05 DIAGNOSIS — C9000 Multiple myeloma not having achieved remission: Secondary | ICD-10-CM

## 2015-01-05 DIAGNOSIS — I868 Varicose veins of other specified sites: Secondary | ICD-10-CM | POA: Diagnosis not present

## 2015-01-05 LAB — CBC WITH DIFFERENTIAL (CANCER CENTER ONLY)
BASO#: 0.1 10*3/uL (ref 0.0–0.2)
BASO%: 1.2 % (ref 0.0–2.0)
EOS%: 3.9 % (ref 0.0–7.0)
Eosinophils Absolute: 0.2 10*3/uL (ref 0.0–0.5)
HEMATOCRIT: 34.5 % — AB (ref 38.7–49.9)
HGB: 11.2 g/dL — ABNORMAL LOW (ref 13.0–17.1)
LYMPH#: 1.1 10*3/uL (ref 0.9–3.3)
LYMPH%: 22.3 % (ref 14.0–48.0)
MCH: 27.7 pg — ABNORMAL LOW (ref 28.0–33.4)
MCHC: 32.5 g/dL (ref 32.0–35.9)
MCV: 85 fL (ref 82–98)
MONO#: 0.9 10*3/uL (ref 0.1–0.9)
MONO%: 17 % — AB (ref 0.0–13.0)
NEUT#: 2.8 10*3/uL (ref 1.5–6.5)
NEUT%: 55.6 % (ref 40.0–80.0)
PLATELETS: 200 10*3/uL (ref 145–400)
RBC: 4.04 10*6/uL — ABNORMAL LOW (ref 4.20–5.70)
RDW: 16.2 % — AB (ref 11.1–15.7)
WBC: 5.1 10*3/uL (ref 4.0–10.0)

## 2015-01-05 LAB — CMP (CANCER CENTER ONLY)
ALBUMIN: 3.3 g/dL (ref 3.3–5.5)
ALK PHOS: 79 U/L (ref 26–84)
ALT: 39 U/L (ref 10–47)
AST: 36 U/L (ref 11–38)
BUN, Bld: 10 mg/dL (ref 7–22)
CALCIUM: 9.2 mg/dL (ref 8.0–10.3)
CO2: 32 mEq/L (ref 18–33)
Chloride: 98 mEq/L (ref 98–108)
Creat: 1.3 mg/dl — ABNORMAL HIGH (ref 0.6–1.2)
GLUCOSE: 118 mg/dL (ref 73–118)
POTASSIUM: 2.8 meq/L — AB (ref 3.3–4.7)
Sodium: 135 mEq/L (ref 128–145)
TOTAL PROTEIN: 6.2 g/dL — AB (ref 6.4–8.1)
Total Bilirubin: 1 mg/dl (ref 0.20–1.60)

## 2015-01-05 LAB — LACTATE DEHYDROGENASE (CC13): LDH: 195 U/L (ref 125–245)

## 2015-01-05 MED ORDER — BORTEZOMIB CHEMO SQ INJECTION 3.5 MG (2.5MG/ML)
1.3000 mg/m2 | Freq: Once | INTRAMUSCULAR | Status: AC
  Administered 2015-01-05: 2.75 mg via SUBCUTANEOUS
  Filled 2015-01-05: qty 2.75

## 2015-01-05 MED ORDER — ZOLEDRONIC ACID 4 MG/100ML IV SOLN
4.0000 mg | Freq: Once | INTRAVENOUS | Status: AC
  Administered 2015-01-05: 4 mg via INTRAVENOUS
  Filled 2015-01-05: qty 100

## 2015-01-05 MED ORDER — ALTEPLASE 2 MG IJ SOLR
2.0000 mg | Freq: Once | INTRAMUSCULAR | Status: DC | PRN
  Filled 2015-01-05: qty 2

## 2015-01-05 MED ORDER — ONDANSETRON HCL 8 MG PO TABS
8.0000 mg | ORAL_TABLET | Freq: Once | ORAL | Status: AC
  Administered 2015-01-05: 8 mg via ORAL

## 2015-01-05 MED ORDER — SODIUM CHLORIDE 0.9 % IJ SOLN
3.0000 mL | Freq: Once | INTRAMUSCULAR | Status: DC | PRN
  Filled 2015-01-05: qty 10

## 2015-01-05 MED ORDER — ONDANSETRON HCL 8 MG PO TABS
ORAL_TABLET | ORAL | Status: AC
  Filled 2015-01-05: qty 1

## 2015-01-05 MED ORDER — HEPARIN SOD (PORK) LOCK FLUSH 100 UNIT/ML IV SOLN
500.0000 [IU] | Freq: Once | INTRAVENOUS | Status: DC | PRN
  Filled 2015-01-05: qty 5

## 2015-01-05 MED ORDER — SODIUM CHLORIDE 0.9 % IJ SOLN
10.0000 mL | INTRAMUSCULAR | Status: DC | PRN
  Filled 2015-01-05: qty 10

## 2015-01-05 MED ORDER — HEPARIN SOD (PORK) LOCK FLUSH 100 UNIT/ML IV SOLN
250.0000 [IU] | Freq: Once | INTRAVENOUS | Status: DC | PRN
  Filled 2015-01-05: qty 5

## 2015-01-05 NOTE — Telephone Encounter (Signed)
Critical Value Potassium 2.8 Dr Ennever notified. No orders at this time.  

## 2015-01-05 NOTE — Patient Instructions (Signed)
[facility] Discharge Instructions for Patients Receiving Chemotherapy  Today you received the following chemotherapy agents Zometa and Velcade.  To help prevent nausea and vomiting after your treatment, we encourage you to take your nausea medication.   If you develop nausea and vomiting that is not controlled by your nausea medication, call the clinic.   BELOW ARE SYMPTOMS THAT SHOULD BE REPORTED IMMEDIATELY:  *FEVER GREATER THAN 100.5 F  *CHILLS WITH OR WITHOUT FEVER  NAUSEA AND VOMITING THAT IS NOT CONTROLLED WITH YOUR NAUSEA MEDICATION  *UNUSUAL SHORTNESS OF BREATH  *UNUSUAL BRUISING OR BLEEDING  TENDERNESS IN MOUTH AND THROAT WITH OR WITHOUT PRESENCE OF ULCERS  *URINARY PROBLEMS  *BOWEL PROBLEMS  UNUSUAL RASH Items with * indicate a potential emergency and should be followed up as soon as possible.  Feel free to call the clinic you have any questions or concerns. The clinic phone number is [phone].  Please show the CHEMO ALERT CARD at check-in to the Emergency Department and triage nurse.   

## 2015-01-05 NOTE — Progress Notes (Signed)
Hematology and Oncology Follow Up Visit  [name] [name] [id number] Jan 06, 1941 74 y.o. 01/05/2015   Principle Diagnosis:   IgG kappa myeloma - Trisomy 11 by FISH  Acute thromboembolism of the right lower leg  Current Therapy:    Patient  s/p cycle #4 of Velcade/Revlimid/Decadron  Zometa 4 mg IV every month       Xarelto 20 mg by mouth daily   History:  [name] [name] is back for follow-up.  He continues to do quite well. So far, his myeloma studies have improved with each cycle. His myeloma studies back in October showed a M spike of 0.4 g/dL. His IgG level was 768 mg/dL. His light chain was down to 3.52 mg/dL.  I talked again his wife at length. I really think that he would be a candidate for stem cell transplant. I realize that he is 74 years old. However, he is in very good shape. He has stayed active. He's had very little in the way of complications from treatment.  He does have chronic low back issues. I don't think this is from the myeloma. We will have to evaluate this however with either plain films or scans.  He's had no issues with cough.  His blood clot in the right leg has resolved. We did go ahead and get a Doppler. This did not show any DVT in the right leg. He does have some varicose veins which are thrombosed. I want him to continue the Xarelto for now.  Overall, I would say that his performance status is ECOG 0.    Medications:  Current outpatient prescriptions:  .  amLODipine (NORVASC) 5 MG tablet, Take 1 tablet (5 mg total) by mouth daily., Disp: 90 tablet, Rfl: 3 .  atorvastatin (LIPITOR) 20 MG tablet, Take 1 tablet (20 mg total) by mouth daily., Disp: 90 tablet, Rfl: 3 .  azithromycin (ZITHROMAX Z-PAK) 250 MG tablet, Take 2 tablets (500 mg) on  Day 1,  followed by 1 tablet (250 mg) once daily on Days 2 through 5., Disp: 6 tablet, Rfl: 0 .  baclofen (LIORESAL) 10 MG tablet, , Disp: , Rfl:  .  carvedilol (COREG) 6.25 MG tablet, Take 1 tablet (6.25 mg total) by  mouth 2 (two) times daily with a meal., Disp: 180 tablet, Rfl: 3 .  clopidogrel (PLAVIX) 75 MG tablet, Take 1 tablet (75 mg total) by mouth daily. 90 Day Supply, Disp: 90 tablet, Rfl: 3 .  dexamethasone (DECADRON) 4 MG tablet, Take 5 pills, with food, once a week. (Patient taking differently: Take 3 pills, with food, once a week.), Disp: 100 tablet, Rfl: 3 .  esomeprazole (NEXIUM) 40 MG capsule, Take 20 mg by mouth daily at 12 noon. , Disp: , Rfl:  .  famciclovir (FAMVIR) 500 MG tablet, Take 1 tablet (500 mg total) by mouth daily., Disp: 90 tablet, Rfl: 3 .  famotidine (PEPCID) 20 MG tablet, Take 1 tablet (20 mg total) by mouth 2 (two) times daily., Disp: 60 tablet, Rfl: 3 .  fluticasone (FLONASE) 50 MCG/ACT nasal spray, One spray in each nostril twice a day, use left hand for right nostril, and right hand for left nostril., Disp: 48 g, Rfl: 3 .  hydrochlorothiazide (MICROZIDE) 12.5 MG capsule, Take 1 capsule (12.5 mg total) by mouth daily., Disp: 90 capsule, Rfl: 3 .  isosorbide mononitrate (IMDUR) 30 MG 24 hr tablet, Take 1 tablet (30 mg total) by mouth 2 (two) times daily. (Patient taking differently: Take 30 mg by mouth 2 (  two) times daily. PATIENT TAKES 60 MG DAILY), Disp: 180 tablet, Rfl: 3 .  lenalidomide (REVLIMID) 25 MG capsule, Take 1 pill a day at bedtime for 21 days and then stop for 7 days. [id number], Disp: 21 capsule, Rfl: 0 .  LORazepam (ATIVAN) 1 MG tablet, Take 1.5 tablets (1.5 mg total) by mouth at bedtime., Disp: 135 tablet, Rfl: 0 .  montelukast (SINGULAIR) 10 MG tablet, Take 1 tablet (10 mg total) by mouth at bedtime., Disp: 90 tablet, Rfl: 3 .  nitroGLYCERIN (NITROSTAT) 0.4 MG SL tablet, Place 0.4 mg under the tongue every 5 (five) minutes as needed. For chest pain, Disp: , Rfl:  .  ondansetron (ZOFRAN) 8 MG tablet, Take 1 tablet (8 mg total) by mouth 2 (two) times daily. Start the day after chemo for 2 days. Then take as needed for nausea or vomiting., Disp: 30 tablet, Rfl:  1 .  oxyCODONE-acetaminophen (PERCOCET) 10-325 MG tablet, Take 1 tablet by mouth 2 (two) times daily as needed for pain., Disp: 60 tablet, Rfl: 0 .  potassium chloride SA (K-DUR,KLOR-CON) 20 MEQ tablet, Take 2 tablets (40 mEq total) by mouth 2 (two) times daily. For 5 days then 40 meq daily, Disp: 90 tablet, Rfl: 2 .  prochlorperazine (COMPAZINE) 10 MG tablet, Take 1 tablet (10 mg total) by mouth every 6 (six) hours as needed (Nausea or vomiting)., Disp: 30 tablet, Rfl: 1 .  pyridOXINE (VITAMIN B-6) 50 MG tablet, Take 1 tablet (50 mg total) by mouth daily., Disp: 90 tablet, Rfl: 3 .  rivaroxaban (XARELTO) 20 MG TABS tablet, Take 1 tablet (20 mg total) by mouth daily with supper. 90 day supply, Disp: 90 tablet, Rfl: 3 .  simvastatin (ZOCOR) 20 MG tablet, Take 1 tablet (20 mg total) by mouth every evening., Disp: 90 tablet, Rfl: 3 .  triamcinolone cream (KENALOG) 0.1 %, , Disp: , Rfl:   Allergies:  Allergies  Allergen Reactions  . Hydrocodone-Acetaminophen Itching  . Other Itching    Flu-like symptoms from portabello mushrooms  . Codeine Itching  . Mushroom Extract Complex Nausea And Vomiting    Only a certain type of mushroom (name unknown) causes this reaction.    Past Medical History, Surgical history, Social history, and Family History were reviewed and updated.  Review of Systems: As above  Physical Exam:  height is 6' (1.829 m) and weight is 195 lb (88.451 kg). His oral temperature is 97.3 F (36.3 C). His blood pressure is 120/56 and his pulse is 52. His respiration is 14.   Wt Readings from Last 3 Encounters:  01/05/15 195 lb (88.451 kg)  12/24/14 201 lb (91.173 kg)  12/10/14 205 lb (92.987 kg)     Well-developed and well-nourished white woman in no obvious distress. Head and neck exam shows no ocular or oral lesions. There are no palpable cervical or supraclavicular lymph nodes. Lungs are clear. Cardiac exam regular rate and rhythm with no murmurs, rubs or bruits. Abdomen  is soft. He has good bowel sounds. There is no fluid wave. There is no palpable liver or spleen tip. Back exam shows no tenderness over the spine, ribs or hips. He is wearing a back brace. Extremities shows no clubbing, cyanosis or edema. There might be some slight swelling of the right lower leg. No obvious venous cord is noted. He has a negative Homans sign with the right leg. He has good pulses in his distal extremities. Neurological exam is nonfocal. Skin exam shows no rashes, ecchymoses or petechia.  Impression and Plan: Mr. Wakley is 74 year old male. He has IgG kappa myeloma. He has had  4 cycles  of chemotherapy.  Again, he is responding.  His myeloma studies look better.  At this point, I will go ahead and plan to do a bone marrow on him. I do think that we have to see about getting him out to [facility]. That's see what the bone marrow shows first.  I also want to make sure we get another bone survey on him.  I think another PET scan also would not be a bad idea.   We will  plan to see him back in 6 weeks. I will like to have him take complex weeks off so that we can get some studies done and he can just enjoy some of the holidays.    [name], MD 11/21/20161:18 PM

## 2015-01-07 LAB — PROTEIN ELECTROPHORESIS, SERUM, WITH REFLEX
ABNORMAL PROTEIN BAND3: NOT DETECTED g/dL
ALBUMIN ELP: 3.4 g/dL — AB (ref 3.8–4.8)
ALPHA-1-GLOBULIN: 0.4 g/dL — AB (ref 0.2–0.3)
ALPHA-2-GLOBULIN: 0.8 g/dL (ref 0.5–0.9)
Abnormal Protein Band1: 0.3 g/dL
Abnormal Protein Band2: 0.1 g/dL
BETA 2: 0.3 g/dL (ref 0.2–0.5)
BETA GLOBULIN: 0.6 g/dL (ref 0.4–0.6)
Gamma Globulin: 0.6 g/dL — ABNORMAL LOW (ref 0.8–1.7)
Total Protein, Serum Electrophoresis: 6 g/dL — ABNORMAL LOW (ref 6.1–8.1)

## 2015-01-07 LAB — IGG, IGA, IGM
IGA: 53 mg/dL — AB (ref 68–379)
IGG (IMMUNOGLOBIN G), SERUM: 742 mg/dL (ref 650–1600)
IgM, Serum: 33 mg/dL — ABNORMAL LOW (ref 41–251)

## 2015-01-07 LAB — KAPPA/LAMBDA LIGHT CHAINS
KAPPA FREE LGHT CHN: 2.78 mg/dL — AB (ref 0.33–1.94)
KAPPA LAMBDA RATIO: 2.46 — AB (ref 0.26–1.65)
LAMBDA FREE LGHT CHN: 1.13 mg/dL (ref 0.57–2.63)

## 2015-01-07 LAB — IFE INTERPRETATION

## 2015-01-07 LAB — BETA 2 MICROGLOBULIN, SERUM: Beta-2 Microglobulin: 2.55 mg/L — ABNORMAL HIGH (ref <=2.51)

## 2015-01-12 ENCOUNTER — Ambulatory Visit (HOSPITAL_BASED_OUTPATIENT_CLINIC_OR_DEPARTMENT_OTHER): Payer: Medicare Other

## 2015-01-12 VITALS — BP 127/60 | HR 57 | Temp 97.8°F | Resp 16

## 2015-01-12 DIAGNOSIS — C9000 Multiple myeloma not having achieved remission: Secondary | ICD-10-CM

## 2015-01-12 DIAGNOSIS — Z5112 Encounter for antineoplastic immunotherapy: Secondary | ICD-10-CM | POA: Diagnosis not present

## 2015-01-12 MED ORDER — ONDANSETRON HCL 8 MG PO TABS
8.0000 mg | ORAL_TABLET | Freq: Once | ORAL | Status: AC
  Administered 2015-01-12: 8 mg via ORAL

## 2015-01-12 MED ORDER — BORTEZOMIB CHEMO SQ INJECTION 3.5 MG (2.5MG/ML)
1.3000 mg/m2 | Freq: Once | INTRAMUSCULAR | Status: AC
  Administered 2015-01-12: 2.75 mg via SUBCUTANEOUS
  Filled 2015-01-12: qty 2.75

## 2015-01-12 MED ORDER — ONDANSETRON HCL 8 MG PO TABS
ORAL_TABLET | ORAL | Status: AC
  Filled 2015-01-12: qty 1

## 2015-01-12 NOTE — Patient Instructions (Signed)
Bortezomib injection What is this medicine? BORTEZOMIB (bor TEZ oh mib) is a chemotherapy drug. It slows the growth of cancer cells. This medicine is used to treat multiple myeloma, and certain lymphomas, such as mantle-cell lymphoma. This medicine may be used for other purposes; ask your health care provider or pharmacist if you have questions. COMMON BRAND NAME(S): Velcade What should I tell my health care provider before I take this medicine? They need to know if you have any of these conditions: -diabetes -heart disease -irregular heartbeat -liver disease -on hemodialysis -low blood counts, like low white blood cells, platelets, or hemoglobin -peripheral neuropathy -taking medicine for blood pressure -an unusual or allergic reaction to bortezomib, mannitol, boron, other medicines, foods, dyes, or preservatives -pregnant or trying to get pregnant -breast-feeding How should I use this medicine? This medicine is for injection into a vein or for injection under the skin. It is given by a health care professional in a hospital or clinic setting. Talk to your pediatrician regarding the use of this medicine in children. Special care may be needed. Overdosage: If you think you have taken too much of this medicine contact a poison control center or emergency room at once. NOTE: This medicine is only for you. Do not share this medicine with others. What if I miss a dose? It is important not to miss your dose. Call your doctor or health care professional if you are unable to keep an appointment. What may interact with this medicine? This medicine may interact with the following medications: -ketoconazole -rifampin -ritonavir -St. John's Wort This list may not describe all possible interactions. Give your health care provider a list of all the medicines, herbs, non-prescription drugs, or dietary supplements you use. Also tell them if you smoke, drink alcohol, or use illegal drugs. Some items  may interact with your medicine. What should I watch for while using this medicine? Visit your doctor for checks on your progress. This drug may make you feel generally unwell. This is not uncommon, as chemotherapy can affect healthy cells as well as cancer cells. Report any side effects. Continue your course of treatment even though you feel ill unless your doctor tells you to stop. You may get drowsy or dizzy. Do not drive, use machinery, or do anything that needs mental alertness until you know how this medicine affects you. Do not stand or sit up quickly, especially if you are an older patient. This reduces the risk of dizzy or fainting spells. In some cases, you may be given additional medicines to help with side effects. Follow all directions for their use. Call your doctor or health care professional for advice if you get a fever, chills or sore throat, or other symptoms of a cold or flu. Do not treat yourself. This drug decreases your body's ability to fight infections. Try to avoid being around people who are sick. This medicine may increase your risk to bruise or bleed. Call your doctor or health care professional if you notice any unusual bleeding. You may need blood work done while you are taking this medicine. In some patients, this medicine may cause a serious brain infection that may cause death. If you have any problems seeing, thinking, speaking, walking, or standing, tell your doctor right away. If you cannot reach your doctor, urgently seek other source of medical care. Do not become pregnant while taking this medicine. Women should inform their doctor if they wish to become pregnant or think they might be pregnant. There is   a potential for serious side effects to an unborn child. Talk to your health care professional or pharmacist for more information. Do not breast-feed an infant while taking this medicine. Check with your doctor or health care professional if you get an attack of  severe diarrhea, nausea and vomiting, or if you sweat a lot. The loss of too much body fluid can make it dangerous for you to take this medicine. What side effects may I notice from receiving this medicine? Side effects that you should report to your doctor or health care professional as soon as possible: -allergic reactions like skin rash, itching or hives, swelling of the face, lips, or tongue -breathing problems -changes in hearing -changes in vision -fast, irregular heartbeat -feeling faint or lightheaded, falls -pain, tingling, numbness in the hands or feet -right upper belly pain -seizures -swelling of the ankles, feet, hands -unusual bleeding or bruising -unusually weak or tired -vomiting -yellowing of the eyes or skin Side effects that usually do not require medical attention (report to your doctor or health care professional if they continue or are bothersome): -changes in emotions or moods -constipation -diarrhea -loss of appetite -headache -irritation at site where injected -nausea This list may not describe all possible side effects. Call your doctor for medical advice about side effects. You may report side effects to FDA at 1-800-FDA-1088. Where should I keep my medicine? This drug is given in a hospital or clinic and will not be stored at home. NOTE: This sheet is a summary. It may not cover all possible information. If you have questions about this medicine, talk to your doctor, pharmacist, or health care provider.  2015, [employer]vier/Gold Standard. (2012-11-26 12:46:32)  

## 2015-01-19 ENCOUNTER — Ambulatory Visit (HOSPITAL_BASED_OUTPATIENT_CLINIC_OR_DEPARTMENT_OTHER): Payer: Medicare Other

## 2015-01-19 DIAGNOSIS — Z5112 Encounter for antineoplastic immunotherapy: Secondary | ICD-10-CM

## 2015-01-19 DIAGNOSIS — C9000 Multiple myeloma not having achieved remission: Secondary | ICD-10-CM

## 2015-01-19 MED ORDER — BORTEZOMIB CHEMO SQ INJECTION 3.5 MG (2.5MG/ML)
1.3000 mg/m2 | Freq: Once | INTRAMUSCULAR | Status: AC
  Administered 2015-01-19: 2.75 mg via SUBCUTANEOUS
  Filled 2015-01-19: qty 2.75

## 2015-01-19 MED ORDER — ONDANSETRON HCL 8 MG PO TABS
8.0000 mg | ORAL_TABLET | Freq: Once | ORAL | Status: AC
  Administered 2015-01-19: 8 mg via ORAL

## 2015-01-19 MED ORDER — ONDANSETRON HCL 8 MG PO TABS
ORAL_TABLET | ORAL | Status: AC
  Filled 2015-01-19: qty 1

## 2015-01-19 NOTE — Patient Instructions (Signed)
[facility] Discharge Instructions for Patients Receiving Chemotherapy  Today you received the following chemotherapy agents:  Velcade  To help prevent nausea and vomiting after your treatment, we encourage you to take your nausea medication as prescribed.   If you develop nausea and vomiting that is not controlled by your nausea medication, call the clinic.   BELOW ARE SYMPTOMS THAT SHOULD BE REPORTED IMMEDIATELY:  *FEVER GREATER THAN 100.5 F  *CHILLS WITH OR WITHOUT FEVER  NAUSEA AND VOMITING THAT IS NOT CONTROLLED WITH YOUR NAUSEA MEDICATION  *UNUSUAL SHORTNESS OF BREATH  *UNUSUAL BRUISING OR BLEEDING  TENDERNESS IN MOUTH AND THROAT WITH OR WITHOUT PRESENCE OF ULCERS  *URINARY PROBLEMS  *BOWEL PROBLEMS  UNUSUAL RASH Items with * indicate a potential emergency and should be followed up as soon as possible.  Feel free to call the clinic you have any questions or concerns. The clinic phone number is [phone].  Please show the CHEMO ALERT CARD at check-in to the Emergency Department and triage nurse.   

## 2015-01-23 ENCOUNTER — Ambulatory Visit (HOSPITAL_COMMUNITY)
Admission: RE | Admit: 2015-01-23 | Discharge: 2015-01-23 | Disposition: A | Discharge status: Home / Self Care | Payer: Medicare Other | Source: Ambulatory Visit | Attending: Hematology & Oncology | Admitting: Hematology & Oncology

## 2015-01-23 ENCOUNTER — Other Ambulatory Visit: Payer: Self-pay | Admitting: Hematology & Oncology

## 2015-01-23 DIAGNOSIS — J439 Emphysema, unspecified: Secondary | ICD-10-CM | POA: Insufficient documentation

## 2015-01-23 DIAGNOSIS — C9000 Multiple myeloma not having achieved remission: Secondary | ICD-10-CM | POA: Diagnosis not present

## 2015-01-23 DIAGNOSIS — R918 Other nonspecific abnormal finding of lung field: Secondary | ICD-10-CM | POA: Diagnosis not present

## 2015-01-23 DIAGNOSIS — C9001 Multiple myeloma in remission: Secondary | ICD-10-CM | POA: Diagnosis not present

## 2015-01-23 DIAGNOSIS — I700 Atherosclerosis of aorta: Secondary | ICD-10-CM | POA: Diagnosis not present

## 2015-01-23 DIAGNOSIS — I2510 Atherosclerotic heart disease of native coronary artery without angina pectoris: Secondary | ICD-10-CM | POA: Insufficient documentation

## 2015-01-23 DIAGNOSIS — N281 Cyst of kidney, acquired: Secondary | ICD-10-CM | POA: Insufficient documentation

## 2015-01-23 LAB — GLUCOSE, CAPILLARY: Glucose-Capillary: 95 mg/dL (ref 65–99)

## 2015-01-23 MED ORDER — FLUDEOXYGLUCOSE F - 18 (FDG) INJECTION
11.3000 | Freq: Once | INTRAVENOUS | Status: AC | PRN
  Administered 2015-01-23: 11.3 via INTRAVENOUS

## 2015-01-24 DIAGNOSIS — J40 Bronchitis, not specified as acute or chronic: Secondary | ICD-10-CM | POA: Diagnosis not present

## 2015-01-24 DIAGNOSIS — J309 Allergic rhinitis, unspecified: Secondary | ICD-10-CM | POA: Diagnosis not present

## 2015-01-24 DIAGNOSIS — I200 Unstable angina: Secondary | ICD-10-CM | POA: Diagnosis not present

## 2015-01-24 DIAGNOSIS — R05 Cough: Secondary | ICD-10-CM | POA: Diagnosis not present

## 2015-01-26 ENCOUNTER — Other Ambulatory Visit: Payer: Self-pay | Admitting: Hematology & Oncology

## 2015-01-26 DIAGNOSIS — C9001 Multiple myeloma in remission: Secondary | ICD-10-CM

## 2015-01-27 ENCOUNTER — Encounter (HOSPITAL_COMMUNITY): Payer: Self-pay

## 2015-01-27 ENCOUNTER — Other Ambulatory Visit: Payer: Self-pay | Admitting: Hematology & Oncology

## 2015-01-27 ENCOUNTER — Ambulatory Visit (HOSPITAL_COMMUNITY)
Admission: RE | Admit: 2015-01-27 | Discharge: 2015-01-27 | Disposition: A | Discharge status: Home / Self Care | Payer: Medicare Other | Source: Ambulatory Visit | Attending: Hematology & Oncology | Admitting: Hematology & Oncology

## 2015-01-27 ENCOUNTER — Other Ambulatory Visit (HOSPITAL_COMMUNITY): Payer: Medicare Other

## 2015-01-27 VITALS — BP 114/61 | HR 56 | Temp 97.9°F | Resp 16 | Ht 72.0 in | Wt 195.0 lb

## 2015-01-27 DIAGNOSIS — D649 Anemia, unspecified: Secondary | ICD-10-CM | POA: Diagnosis not present

## 2015-01-27 DIAGNOSIS — C9001 Multiple myeloma in remission: Secondary | ICD-10-CM | POA: Diagnosis not present

## 2015-01-27 LAB — CBC WITH DIFFERENTIAL/PLATELET
BASOS PCT: 1 %
Basophils Absolute: 0 10*3/uL (ref 0.0–0.1)
EOS ABS: 0.3 10*3/uL (ref 0.0–0.7)
Eosinophils Relative: 6 %
HEMATOCRIT: 34 % — AB (ref 39.0–52.0)
Hemoglobin: 10.9 g/dL — ABNORMAL LOW (ref 13.0–17.0)
Lymphocytes Relative: 21 %
Lymphs Abs: 1.1 10*3/uL (ref 0.7–4.0)
MCH: 27.4 pg (ref 26.0–34.0)
MCHC: 32.1 g/dL (ref 30.0–36.0)
MCV: 85.4 fL (ref 78.0–100.0)
MONO ABS: 0.6 10*3/uL (ref 0.1–1.0)
MONOS PCT: 13 %
Neutro Abs: 3 10*3/uL (ref 1.7–7.7)
Neutrophils Relative %: 59 %
Platelets: 215 10*3/uL (ref 150–400)
RBC: 3.98 MIL/uL — ABNORMAL LOW (ref 4.22–5.81)
RDW: 17.4 % — AB (ref 11.5–15.5)
WBC: 5 10*3/uL (ref 4.0–10.5)

## 2015-01-27 LAB — BONE MARROW EXAM

## 2015-01-27 MED ORDER — MEPERIDINE HCL 50 MG/ML IJ SOLN
50.0000 mg | Freq: Once | INTRAMUSCULAR | Status: DC
  Filled 2015-01-27: qty 1

## 2015-01-27 MED ORDER — MEPERIDINE HCL 25 MG/ML IJ SOLN
INTRAMUSCULAR | Status: AC | PRN
  Administered 2015-01-27: 25 mg via INTRAVENOUS

## 2015-01-27 MED ORDER — MIDAZOLAM HCL 5 MG/ML IJ SOLN
10.0000 mg | Freq: Once | INTRAMUSCULAR | Status: DC
  Filled 2015-01-27: qty 2

## 2015-01-27 MED ORDER — SODIUM CHLORIDE 0.9 % IV SOLN
INTRAVENOUS | Status: DC
  Administered 2015-01-27: 07:00:00 via INTRAVENOUS

## 2015-01-27 MED ORDER — MIDAZOLAM HCL 5 MG/5ML IJ SOLN
INTRAMUSCULAR | Status: AC | PRN
  Administered 2015-01-27: 3 mg via INTRAVENOUS

## 2015-01-27 NOTE — Discharge Instructions (Signed)
Moderate Conscious Sedation, Adult, Care After °Refer to this sheet in the next few weeks. These instructions provide you with information on caring for yourself after your procedure. Your health care provider may also give you more specific instructions. Your treatment has been planned according to current medical practices, but problems sometimes occur. Call your health care provider if you have any problems or questions after your procedure. °WHAT TO EXPECT AFTER THE PROCEDURE  °After your procedure: °· You may feel sleepy, clumsy, and have poor balance for several hours. °· Vomiting may occur if you eat too soon after the procedure. °HOME CARE INSTRUCTIONS °· Do not participate in any activities where you could become injured for at least 24 hours. Do not: °¨ Drive. °¨ Swim. °¨ Ride a bicycle. °¨ Operate heavy machinery. °¨ Cook. °¨ Use power tools. °¨ Climb ladders. °¨ Work from a high place. °· Do not make important decisions or sign legal documents until you are improved. °· If you vomit, drink water, juice, or soup when you can drink without vomiting. Make sure you have little or no nausea before eating solid foods. °· Only take over-the-counter or prescription medicines for pain, discomfort, or fever as directed by your health care provider. °· Make sure you and your family fully understand everything about the medicines given to you, including what side effects may occur. °· You should not drink alcohol, take sleeping pills, or take medicines that cause drowsiness for at least 24 hours. °· If you smoke, do not smoke without supervision. °· If you are feeling better, you may resume normal activities 24 hours after you were sedated. °· Keep all appointments with your health care provider. °SEEK MEDICAL CARE IF: °· Your skin is pale or bluish in color. °· You continue to feel nauseous or vomit. °· Your pain is getting worse and is not helped by medicine. °· You have bleeding or swelling. °· You are still  sleepy or feeling clumsy after 24 hours. °SEEK IMMEDIATE MEDICAL CARE IF: °· You develop a rash. °· You have difficulty breathing. °· You develop any type of allergic problem. °· You have a fever. °MAKE SURE YOU: °· Understand these instructions. °· Will watch your condition. °· Will get help right away if you are not doing well or get worse. °  °This information is not intended to replace advice given to you by your health care provider. Make sure you discuss any questions you have with your health care provider. °  °Document Released: 11/21/2012 Document Revised: 02/21/2014 Document Reviewed: 11/21/2012 °Elsevier Interactive Patient Education ©2016 [employer] °Bone Marrow Aspiration and Bone Marrow Biopsy, Care After °Refer to this sheet in the next few weeks. These instructions provide you with information about caring for yourself after your procedure. Your health care provider may also give you more specific instructions. Your treatment has been planned according to current medical practices, but problems sometimes occur. Call your health care provider if you have any problems or questions after your procedure. °WHAT TO EXPECT AFTER THE PROCEDURE °After your procedure, it is common to have: °· Soreness or tenderness around the puncture site. °· Bruising. °HOME CARE INSTRUCTIONS °· Take medicines only as directed by your health care provider. °· Follow your health care provider's instructions about: °¨ Puncture site care. °¨ Bandage (dressing) changes and removal. °· Bathe and shower as directed by your health care provider. °· Check your puncture site every day for signs of infection. Watch for: °¨ Redness, swelling, or pain. °¨   Fluid, blood, or pus. °· Return to your normal activities as directed by your health care provider. °· Keep all follow-up visits as directed by your health care provider. This is important. °SEEK MEDICAL CARE IF: °· You have a fever. °· You have uncontrollable bleeding. °· You have  redness, swelling, or pain at the site of your puncture. °· You have fluid, blood, or pus coming from your puncture site. °  °This information is not intended to replace advice given to you by your health care provider. Make sure you discuss any questions you have with your health care provider. °  °Document Released: 08/20/2004 Document Revised: 06/17/2014 Document Reviewed: 01/22/2014 °Elsevier Interactive Patient Education ©2016 [employer] ° °

## 2015-01-27 NOTE — Procedures (Signed)
This is a bone marrow biopsy and aspirate procedure note for [name] [name]. He is part of the short stay unit at one hospital. He had IV placed into his left arm.  We did the appropriate timeout procedure at 7:45 AM.  His Mallimpati score is 1. His ASA class is 1.  We have placed onto his right side. We gave him 3 mg of Versed and 25 mg of Demerol for IV sedation.  The left posteriorly crest region was prepped and draped in sterile fashion. 5 mL of 1% lidocaine was admitted under the skin down to the periosteum.  We used a scalpel to make an incision into the skin.  I then use the combination aspirate and biopsy needle. I obtained to excellent aspirates. I then went ahead and got a very good core biopsy.  He tolerated the procedure well. We cleaned and dressed the procedure site sterilely.  There were no, occasions with the procedure.  I went and got his wife. I told her about the procedure. I told her that we should have the results back on Thursday or Friday.  This is a procedure note for [name] [name].  [name] [name]

## 2015-01-30 DIAGNOSIS — K219 Gastro-esophageal reflux disease without esophagitis: Secondary | ICD-10-CM | POA: Diagnosis not present

## 2015-01-30 DIAGNOSIS — E119 Type 2 diabetes mellitus without complications: Secondary | ICD-10-CM | POA: Diagnosis not present

## 2015-01-30 DIAGNOSIS — Z79891 Long term (current) use of opiate analgesic: Secondary | ICD-10-CM | POA: Diagnosis not present

## 2015-01-30 DIAGNOSIS — J449 Chronic obstructive pulmonary disease, unspecified: Secondary | ICD-10-CM | POA: Diagnosis not present

## 2015-01-30 DIAGNOSIS — I10 Essential (primary) hypertension: Secondary | ICD-10-CM | POA: Diagnosis not present

## 2015-01-30 DIAGNOSIS — R001 Bradycardia, unspecified: Secondary | ICD-10-CM | POA: Diagnosis not present

## 2015-01-30 DIAGNOSIS — I2510 Atherosclerotic heart disease of native coronary artery without angina pectoris: Secondary | ICD-10-CM | POA: Diagnosis not present

## 2015-01-30 DIAGNOSIS — R531 Weakness: Secondary | ICD-10-CM | POA: Diagnosis not present

## 2015-01-30 DIAGNOSIS — Z792 Long term (current) use of antibiotics: Secondary | ICD-10-CM | POA: Diagnosis not present

## 2015-01-30 DIAGNOSIS — R05 Cough: Secondary | ICD-10-CM | POA: Diagnosis not present

## 2015-01-30 DIAGNOSIS — Z885 Allergy status to narcotic agent status: Secondary | ICD-10-CM | POA: Diagnosis not present

## 2015-01-30 DIAGNOSIS — E785 Hyperlipidemia, unspecified: Secondary | ICD-10-CM | POA: Diagnosis not present

## 2015-01-30 DIAGNOSIS — Z79899 Other long term (current) drug therapy: Secondary | ICD-10-CM | POA: Diagnosis not present

## 2015-01-30 DIAGNOSIS — I4510 Unspecified right bundle-branch block: Secondary | ICD-10-CM | POA: Diagnosis not present

## 2015-01-30 DIAGNOSIS — Z91018 Allergy to other foods: Secondary | ICD-10-CM | POA: Diagnosis not present

## 2015-01-30 DIAGNOSIS — R0602 Shortness of breath: Secondary | ICD-10-CM | POA: Diagnosis not present

## 2015-01-30 DIAGNOSIS — R9431 Abnormal electrocardiogram [ECG] [EKG]: Secondary | ICD-10-CM | POA: Diagnosis not present

## 2015-01-30 DIAGNOSIS — Z888 Allergy status to other drugs, medicaments and biological substances status: Secondary | ICD-10-CM | POA: Diagnosis not present

## 2015-01-30 DIAGNOSIS — Z7951 Long term (current) use of inhaled steroids: Secondary | ICD-10-CM | POA: Diagnosis not present

## 2015-01-30 DIAGNOSIS — R5383 Other fatigue: Secondary | ICD-10-CM | POA: Diagnosis not present

## 2015-01-30 DIAGNOSIS — N400 Enlarged prostate without lower urinary tract symptoms: Secondary | ICD-10-CM | POA: Diagnosis not present

## 2015-01-30 DIAGNOSIS — D649 Anemia, unspecified: Secondary | ICD-10-CM | POA: Diagnosis not present

## 2015-01-30 DIAGNOSIS — Z87891 Personal history of nicotine dependence: Secondary | ICD-10-CM | POA: Diagnosis not present

## 2015-01-30 DIAGNOSIS — K644 Residual hemorrhoidal skin tags: Secondary | ICD-10-CM | POA: Diagnosis not present

## 2015-01-30 DIAGNOSIS — R918 Other nonspecific abnormal finding of lung field: Secondary | ICD-10-CM | POA: Diagnosis not present

## 2015-01-30 DIAGNOSIS — Z7901 Long term (current) use of anticoagulants: Secondary | ICD-10-CM | POA: Diagnosis not present

## 2015-01-30 DIAGNOSIS — I200 Unstable angina: Secondary | ICD-10-CM | POA: Diagnosis not present

## 2015-01-30 DIAGNOSIS — Z7982 Long term (current) use of aspirin: Secondary | ICD-10-CM | POA: Diagnosis not present

## 2015-01-30 DIAGNOSIS — I447 Left bundle-branch block, unspecified: Secondary | ICD-10-CM | POA: Diagnosis not present

## 2015-01-30 DIAGNOSIS — J439 Emphysema, unspecified: Secondary | ICD-10-CM | POA: Diagnosis not present

## 2015-02-02 ENCOUNTER — Other Ambulatory Visit: Payer: Self-pay | Admitting: *Deleted

## 2015-02-02 ENCOUNTER — Other Ambulatory Visit: Payer: Self-pay | Admitting: Physician Assistant

## 2015-02-02 DIAGNOSIS — C9000 Multiple myeloma not having achieved remission: Secondary | ICD-10-CM

## 2015-02-02 MED ORDER — FAMOTIDINE 20 MG PO TABS: 20.0000 mg | ORAL_TABLET | Freq: Two times a day (BID) | ORAL | Status: DC

## 2015-02-03 ENCOUNTER — Ambulatory Visit (INDEPENDENT_AMBULATORY_CARE_PROVIDER_SITE_OTHER): Payer: Medicare Other | Admitting: Sports Medicine

## 2015-02-03 ENCOUNTER — Other Ambulatory Visit: Payer: Self-pay | Admitting: *Deleted

## 2015-02-03 DIAGNOSIS — J4489 Other specified chronic obstructive pulmonary disease: Secondary | ICD-10-CM

## 2015-02-03 DIAGNOSIS — I2510 Atherosclerotic heart disease of native coronary artery without angina pectoris: Secondary | ICD-10-CM | POA: Diagnosis not present

## 2015-02-03 DIAGNOSIS — C9000 Multiple myeloma not having achieved remission: Secondary | ICD-10-CM

## 2015-02-03 DIAGNOSIS — J45909 Unspecified asthma, uncomplicated: Secondary | ICD-10-CM | POA: Diagnosis not present

## 2015-02-03 DIAGNOSIS — J449 Chronic obstructive pulmonary disease, unspecified: Secondary | ICD-10-CM

## 2015-02-03 MED ORDER — HYDROCOD POLST-CPM POLST ER 10-8 MG/5ML PO SUER: 5.0000 mL | Freq: Two times a day (BID) | ORAL | Status: DC | PRN

## 2015-02-03 MED ORDER — LENALIDOMIDE 25 MG PO CAPS: ORAL_CAPSULE | ORAL | Status: DC

## 2015-02-03 MED ORDER — BENZONATATE 200 MG PO CAPS: 200.0000 mg | ORAL_CAPSULE | Freq: Three times a day (TID) | ORAL | Status: DC | PRN

## 2015-02-03 NOTE — Assessment & Plan Note (Signed)
Has been treated aggressively with several courses of steroids, doxycycline, azithromycin. Symptoms seem to be improving significantly and exam is benign today. Refilling Tussionex, Tessalon Perles. Return as needed.

## 2015-02-03 NOTE — Progress Notes (Signed)
  Subjective:    CC: coughing  HPI: This is a pleasant 74 year old male with a history of asthma and COPD, he comes in with a cough for 6 weeks, he was treated by me with prednisone and azithromycin which relieved his symptoms, he had immediate recurrence, and was seen at the urgent care where he was treated with doxycycline and cough suppressants. Symptoms are persistent however having in the right direction, no shortness of breath, no weight loss, no other constitutional symptoms. No chest pain.  Past medical history, Surgical history, Family history not pertinant except as noted below, Social history, Allergies, and medications have been entered into the medical record, reviewed, and no changes needed.   Review of Systems: No fevers, chills, night sweats, weight loss, chest pain, or shortness of breath.   Objective:    General: Well Developed, well nourished, and in no acute distress.  Neuro: Alert and oriented x3, extra-ocular muscles intact, sensation grossly intact.  HEENT: Normocephalic, atraumatic, pupils equal round reactive to light, neck supple, no masses, no lymphadenopathy, thyroid nonpalpable.  Skin: Warm and dry, no rashes. Cardiac: Regular rate and rhythm, no murmurs rubs or gallops, no lower extremity edema.  Respiratory: Clear to auscultation bilaterally. Not using accessory muscles, speaking in full sentences.  Impression and Recommendations:

## 2015-02-05 LAB — TISSUE HYBRIDIZATION (BONE MARROW)-NCBH

## 2015-02-05 LAB — CHROMOSOME ANALYSIS, BONE MARROW

## 2015-02-10 DIAGNOSIS — I2699 Other pulmonary embolism without acute cor pulmonale: Secondary | ICD-10-CM | POA: Diagnosis not present

## 2015-02-10 DIAGNOSIS — I209 Angina pectoris, unspecified: Secondary | ICD-10-CM | POA: Diagnosis not present

## 2015-02-10 DIAGNOSIS — I2582 Chronic total occlusion of coronary artery: Secondary | ICD-10-CM | POA: Diagnosis not present

## 2015-02-10 DIAGNOSIS — Z955 Presence of coronary angioplasty implant and graft: Secondary | ICD-10-CM | POA: Diagnosis not present

## 2015-02-10 DIAGNOSIS — I10 Essential (primary) hypertension: Secondary | ICD-10-CM | POA: Diagnosis not present

## 2015-02-10 DIAGNOSIS — I200 Unstable angina: Secondary | ICD-10-CM | POA: Diagnosis not present

## 2015-02-11 ENCOUNTER — Ambulatory Visit (HOSPITAL_BASED_OUTPATIENT_CLINIC_OR_DEPARTMENT_OTHER): Payer: Medicare Other | Admitting: Hematology & Oncology

## 2015-02-11 ENCOUNTER — Ambulatory Visit (HOSPITAL_BASED_OUTPATIENT_CLINIC_OR_DEPARTMENT_OTHER): Payer: Medicare Other

## 2015-02-11 ENCOUNTER — Encounter: Payer: Self-pay | Admitting: Hematology & Oncology

## 2015-02-11 ENCOUNTER — Telehealth: Payer: Self-pay | Admitting: *Deleted

## 2015-02-11 VITALS — BP 132/69 | HR 52 | Temp 97.4°F | Resp 16 | Ht 72.0 in | Wt 196.0 lb

## 2015-02-11 DIAGNOSIS — C9001 Multiple myeloma in remission: Secondary | ICD-10-CM

## 2015-02-11 DIAGNOSIS — Z5112 Encounter for antineoplastic immunotherapy: Secondary | ICD-10-CM | POA: Diagnosis not present

## 2015-02-11 DIAGNOSIS — C9000 Multiple myeloma not having achieved remission: Secondary | ICD-10-CM

## 2015-02-11 LAB — CBC WITH DIFFERENTIAL (CANCER CENTER ONLY)
BASO#: 0.1 10*3/uL (ref 0.0–0.2)
BASO%: 1.6 % (ref 0.0–2.0)
EOS ABS: 0.1 10*3/uL (ref 0.0–0.5)
EOS%: 1.8 % (ref 0.0–7.0)
HCT: 31.7 % — ABNORMAL LOW (ref 38.7–49.9)
HGB: 10.3 g/dL — ABNORMAL LOW (ref 13.0–17.1)
LYMPH#: 0.9 10*3/uL (ref 0.9–3.3)
LYMPH%: 20.1 % (ref 14.0–48.0)
MCH: 27.5 pg — ABNORMAL LOW (ref 28.0–33.4)
MCHC: 32.5 g/dL (ref 32.0–35.9)
MCV: 85 fL (ref 82–98)
MONO#: 0.8 10*3/uL (ref 0.1–0.9)
MONO%: 18.8 % — ABNORMAL HIGH (ref 0.0–13.0)
NEUT#: 2.6 10*3/uL (ref 1.5–6.5)
NEUT%: 57.7 % (ref 40.0–80.0)
PLATELETS: 218 10*3/uL (ref 145–400)
RBC: 3.75 10*6/uL — ABNORMAL LOW (ref 4.20–5.70)
RDW: 17 % — AB (ref 11.1–15.7)
WBC: 4.5 10*3/uL (ref 4.0–10.0)

## 2015-02-11 LAB — CMP (CANCER CENTER ONLY)
ALBUMIN: 3.1 g/dL — AB (ref 3.3–5.5)
ALT(SGPT): 28 U/L (ref 10–47)
AST: 23 U/L (ref 11–38)
Alkaline Phosphatase: 94 U/L — ABNORMAL HIGH (ref 26–84)
BILIRUBIN TOTAL: 0.6 mg/dL (ref 0.20–1.60)
BUN: 16 mg/dL (ref 7–22)
CO2: 26 meq/L (ref 18–33)
CREATININE: 1.1 mg/dL (ref 0.6–1.2)
Calcium: 8.3 mg/dL (ref 8.0–10.3)
Chloride: 104 mEq/L (ref 98–108)
GLUCOSE: 116 mg/dL (ref 73–118)
Potassium: 3 mEq/L — CL (ref 3.3–4.7)
SODIUM: 143 meq/L (ref 128–145)
Total Protein: 6.7 g/dL (ref 6.4–8.1)

## 2015-02-11 MED ORDER — ONDANSETRON HCL 8 MG PO TABS
ORAL_TABLET | ORAL | Status: AC
  Filled 2015-02-11: qty 1

## 2015-02-11 MED ORDER — BORTEZOMIB CHEMO SQ INJECTION 3.5 MG (2.5MG/ML)
1.3000 mg/m2 | Freq: Once | INTRAMUSCULAR | Status: AC
  Administered 2015-02-11: 2.75 mg via SUBCUTANEOUS
  Filled 2015-02-11: qty 2.75

## 2015-02-11 MED ORDER — ZOLEDRONIC ACID 4 MG/100ML IV SOLN
4.0000 mg | Freq: Once | INTRAVENOUS | Status: AC
  Administered 2015-02-11: 4 mg via INTRAVENOUS
  Filled 2015-02-11: qty 100

## 2015-02-11 MED ORDER — ONDANSETRON HCL 8 MG PO TABS
8.0000 mg | ORAL_TABLET | Freq: Once | ORAL | Status: AC
  Administered 2015-02-11: 8 mg via ORAL

## 2015-02-11 NOTE — Patient Instructions (Addendum)
Bortezomib injection What is this medicine? BORTEZOMIB (bor TEZ oh mib) is a medicine that targets proteins in cancer cells and stops the cancer cells from growing. It is used to treat multiple myeloma and mantle-cell lymphoma. This medicine may be used for other purposes; ask your health care provider or pharmacist if you have questions. What should I tell my health care provider before I take this medicine? They need to know if you have any of these conditions: -diabetes -heart disease -irregular heartbeat -liver disease -on hemodialysis -low blood counts, like low white blood cells, platelets, or hemoglobin -peripheral neuropathy -taking medicine for blood pressure -an unusual or allergic reaction to bortezomib, mannitol, boron, other medicines, foods, dyes, or preservatives -pregnant or trying to get pregnant -breast-feeding How should I use this medicine? This medicine is for injection into a vein or for injection under the skin. It is given by a health care professional in a hospital or clinic setting. Talk to your pediatrician regarding the use of this medicine in children. Special care may be needed. Overdosage: If you think you have taken too much of this medicine contact a poison control center or emergency room at once. NOTE: This medicine is only for you. Do not share this medicine with others. What if I miss a dose? It is important not to miss your dose. Call your doctor or health care professional if you are unable to keep an appointment. What may interact with this medicine? This medicine may interact with the following medications: -ketoconazole -rifampin -ritonavir -St. John's Wort This list may not describe all possible interactions. Give your health care provider a list of all the medicines, herbs, non-prescription drugs, or dietary supplements you use. Also tell them if you smoke, drink alcohol, or use illegal drugs. Some items may interact with your medicine. What  should I watch for while using this medicine? Visit your doctor for checks on your progress. This drug may make you feel generally unwell. This is not uncommon, as chemotherapy can affect healthy cells as well as cancer cells. Report any side effects. Continue your course of treatment even though you feel ill unless your doctor tells you to stop. You may get drowsy or dizzy. Do not drive, use machinery, or do anything that needs mental alertness until you know how this medicine affects you. Do not stand or sit up quickly, especially if you are an older patient. This reduces the risk of dizzy or fainting spells. In some cases, you may be given additional medicines to help with side effects. Follow all directions for their use. Call your doctor or health care professional for advice if you get a fever, chills or sore throat, or other symptoms of a cold or flu. Do not treat yourself. This drug decreases your body's ability to fight infections. Try to avoid being around people who are sick. This medicine may increase your risk to bruise or bleed. Call your doctor or health care professional if you notice any unusual bleeding. You may need blood work done while you are taking this medicine. In some patients, this medicine may cause a serious brain infection that may cause death. If you have any problems seeing, thinking, speaking, walking, or standing, tell your doctor right away. If you cannot reach your doctor, urgently seek other source of medical care. Do not become pregnant while taking this medicine. Women should inform their doctor if they wish to become pregnant or think they might be pregnant. There is a potential for serious  side effects to an unborn child. Talk to your health care professional or pharmacist for more information. Do not breast-feed an infant while taking this medicine. Check with your doctor or health care professional if you get an attack of severe diarrhea, nausea and vomiting, or if  you sweat a lot. The loss of too much body fluid can make it dangerous for you to take this medicine. What side effects may I notice from receiving this medicine? Side effects that you should report to your doctor or health care professional as soon as possible: -allergic reactions like skin rash, itching or hives, swelling of the face, lips, or tongue -breathing problems -changes in hearing -changes in vision -fast, irregular heartbeat -feeling faint or lightheaded, falls -pain, tingling, numbness in the hands or feet -right upper belly pain -seizures -swelling of the ankles, feet, hands -unusual bleeding or bruising -unusually weak or tired -vomiting -yellowing of the eyes or skin Side effects that usually do not require medical attention (report to your doctor or health care professional if they continue or are bothersome): -changes in emotions or moods -constipation -diarrhea -loss of appetite -headache -irritation at site where injected -nausea This list may not describe all possible side effects. Call your doctor for medical advice about side effects. You may report side effects to FDA at 1-800-FDA-1088. Where should I keep my medicine? This drug is given in a hospital or clinic and will not be stored at home. NOTE: This sheet is a summary. It may not cover all possible information. If you have questions about this medicine, talk to your doctor, pharmacist, or health care provider.    2016, Elsevier/Gold Standard. (2014-04-01 14:47:04) Zoledronic Acid injection (Hypercalcemia, Oncology) What is this medicine? ZOLEDRONIC ACID (ZOE le dron ik AS id) lowers the amount of calcium loss from bone. It is used to treat too much calcium in your blood from cancer. It is also used to prevent complications of cancer that has spread to the bone. This medicine may be used for other purposes; ask your health care provider or pharmacist if you have questions. What should I tell my health  care provider before I take this medicine? They need to know if you have any of these conditions: -aspirin-sensitive asthma -cancer, especially if you are receiving medicines used to treat cancer -dental disease or wear dentures -infection -kidney disease -receiving corticosteroids like dexamethasone or prednisone -an unusual or allergic reaction to zoledronic acid, other medicines, foods, dyes, or preservatives -pregnant or trying to get pregnant -breast-feeding How should I use this medicine? This medicine is for infusion into a vein. It is given by a health care professional in a hospital or clinic setting. Talk to your pediatrician regarding the use of this medicine in children. Special care may be needed. Overdosage: If you think you have taken too much of this medicine contact a poison control center or emergency room at once. NOTE: This medicine is only for you. Do not share this medicine with others. What if I miss a dose? It is important not to miss your dose. Call your doctor or health care professional if you are unable to keep an appointment. What may interact with this medicine? -certain antibiotics given by injection -NSAIDs, medicines for pain and inflammation, like ibuprofen or naproxen -some diuretics like bumetanide, furosemide -teriparatide -thalidomide This list may not describe all possible interactions. Give your health care provider a list of all the medicines, herbs, non-prescription drugs, or dietary supplements you use. Also tell them   if you smoke, drink alcohol, or use illegal drugs. Some items may interact with your medicine. What should I watch for while using this medicine? Visit your doctor or health care professional for regular checkups. It may be some time before you see the benefit from this medicine. Do not stop taking your medicine unless your doctor tells you to. Your doctor may order blood tests or other tests to see how you are doing. Women should  inform their doctor if they wish to become pregnant or think they might be pregnant. There is a potential for serious side effects to an unborn child. Talk to your health care professional or pharmacist for more information. You should make sure that you get enough calcium and vitamin D while you are taking this medicine. Discuss the foods you eat and the vitamins you take with your health care professional. Some people who take this medicine have severe bone, joint, and/or muscle pain. This medicine may also increase your risk for jaw problems or a broken thigh bone. Tell your doctor right away if you have severe pain in your jaw, bones, joints, or muscles. Tell your doctor if you have any pain that does not go away or that gets worse. Tell your dentist and dental surgeon that you are taking this medicine. You should not have major dental surgery while on this medicine. See your dentist to have a dental exam and fix any dental problems before starting this medicine. Take good care of your teeth while on this medicine. Make sure you see your dentist for regular follow-up appointments. What side effects may I notice from receiving this medicine? Side effects that you should report to your doctor or health care professional as soon as possible: -allergic reactions like skin rash, itching or hives, swelling of the face, lips, or tongue -anxiety, confusion, or depression -breathing problems -changes in vision -eye pain -feeling faint or lightheaded, falls -jaw pain, especially after dental work -mouth sores -muscle cramps, stiffness, or weakness -redness, blistering, peeling or loosening of the skin, including inside the mouth -trouble passing urine or change in the amount of urine Side effects that usually do not require medical attention (report to your doctor or health care professional if they continue or are bothersome): -bone, joint, or muscle pain -constipation -diarrhea -fever -hair  loss -irritation at site where injected -loss of appetite -nausea, vomiting -stomach upset -trouble sleeping -trouble swallowing -weak or tired This list may not describe all possible side effects. Call your doctor for medical advice about side effects. You may report side effects to FDA at 1-800-FDA-1088. Where should I keep my medicine? This drug is given in a hospital or clinic and will not be stored at home. NOTE: This sheet is a summary. It may not cover all possible information. If you have questions about this medicine, talk to your doctor, pharmacist, or health care provider.    2016, Elsevier/Gold Standard. (2013-06-29 14:19:39)

## 2015-02-11 NOTE — Progress Notes (Signed)
Hematology and Oncology Follow Up Visit  [name] [name] [id number] Jul 03, 1940 74 y.o. 02/11/2015   Principle Diagnosis:   IgG kappa myeloma - Trisomy 11 by FISH  Acute thromboembolism of the right lower leg  Current Therapy:    Patient  s/p cycle #5 of Velcade/Revlimid/Decadron  Zometa 4 mg IV every month       Xarelto 20 mg by mouth daily   History:  [name] [name] is back for follow-up.  We did go ahead and do a bone marrow biopsy on him. This was after his fifth cycle of treatment. Thankfully, she bone marrow report ([id number]) showed a variably see a marrow with only 4% plasma cells. His cytogenetics and FISH were normal.  I referred him out to [facility] for a autologous stem cell transplant area I thought he would be a good candidate for this.  His last myeloma studies back in November showed 2 monoclonal peaks. One measures 0.3 g/dL and the other measured 0.1 g/dL. His IgG level was down to 742 mg/dL. His Kappa Light chain was 2.78 mg/dL.  We did a another PET scan on him. There is no evidence of metabolically active bone tumors.  Again, he feels well. He's had a good Christmas.  He sees the transplant doctors at [facility] in mid January.  He's had a good appetite. He's had no nausea or vomiting. He's had no change in bowel or bladder habits. He's had no cough. He's had no rashes. He's had no leg swelling.  Overall, his performance status is ECOG 0.    Overall, I would say that his performance status is ECOG 0.    Medications:  Current outpatient prescriptions:  .  amLODipine (NORVASC) 5 MG tablet, Take 1 tablet (5 mg total) by mouth daily., Disp: 90 tablet, Rfl: 3 .  atorvastatin (LIPITOR) 20 MG tablet, Take 1 tablet (20 mg total) by mouth daily., Disp: 90 tablet, Rfl: 3 .  baclofen (LIORESAL) 10 MG tablet, , Disp: , Rfl:  .  carvedilol (COREG) 6.25 MG tablet, Take 1 tablet (6.25 mg total) by mouth 2 (two) times daily with a meal., Disp: 180 tablet, Rfl: 3 .  dexamethasone  (DECADRON) 4 MG tablet, Take 5 pills, with food, once a week. (Patient taking differently: Take 3 pills, with food, once a week.), Disp: 100 tablet, Rfl: 3 .  esomeprazole (NEXIUM) 40 MG capsule, Take 20 mg by mouth daily at 12 noon. , Disp: , Rfl:  .  famciclovir (FAMVIR) 500 MG tablet, Take 1 tablet (500 mg total) by mouth daily., Disp: 90 tablet, Rfl: 3 .  famotidine (PEPCID) 20 MG tablet, Take 1 tablet (20 mg total) by mouth 2 (two) times daily., Disp: 60 tablet, Rfl: 6 .  hydrochlorothiazide (MICROZIDE) 12.5 MG capsule, Take 1 capsule (12.5 mg total) by mouth daily., Disp: 90 capsule, Rfl: 3 .  isosorbide mononitrate (IMDUR) 30 MG 24 hr tablet, Take 1 tablet (30 mg total) by mouth 2 (two) times daily. (Patient taking differently: Take 30 mg by mouth 2 (two) times daily. PATIENT TAKES 60 MG DAILY), Disp: 180 tablet, Rfl: 3 .  lenalidomide (REVLIMID) 25 MG capsule, Take 1 pill a day at bedtime for 21 days and then stop for 7 days. XYIA#[id number], Disp: 21 capsule, Rfl: 0 .  LORazepam (ATIVAN) 1 MG tablet, TAKE 1 TABLET AT BEDTIME, Disp: 90 tablet, Rfl: 1 .  montelukast (SINGULAIR) 10 MG tablet, Take 1 tablet (10 mg total) by mouth at bedtime., Disp: 90 tablet, Rfl: 3 .  nitroGLYCERIN (NITROSTAT) 0.4 MG SL tablet, Place 0.4 mg under the tongue every 5 (five) minutes as needed. For chest pain, Disp: , Rfl:  .  ondansetron (ZOFRAN) 8 MG tablet, Take 1 tablet (8 mg total) by mouth 2 (two) times daily. Start the day after chemo for 2 days. Then take as needed for nausea or vomiting., Disp: 30 tablet, Rfl: 1 .  oxyCODONE-acetaminophen (PERCOCET) 10-325 MG tablet, Take 1 tablet by mouth 2 (two) times daily as needed for pain., Disp: 60 tablet, Rfl: 0 .  potassium chloride SA (K-DUR,KLOR-CON) 20 MEQ tablet, Take 2 tablets (40 mEq total) by mouth 2 (two) times daily. For 5 days then 40 meq daily, Disp: 90 tablet, Rfl: 2 .  prochlorperazine (COMPAZINE) 10 MG tablet, Take 1 tablet (10 mg total) by mouth every 6  (six) hours as needed (Nausea or vomiting)., Disp: 30 tablet, Rfl: 1 .  pyridOXINE (VITAMIN B-6) 50 MG tablet, Take 1 tablet (50 mg total) by mouth daily., Disp: 90 tablet, Rfl: 3 .  rivaroxaban (XARELTO) 20 MG TABS tablet, Take 1 tablet (20 mg total) by mouth daily with supper. 90 day supply, Disp: 90 tablet, Rfl: 3 .  simvastatin (ZOCOR) 20 MG tablet, Take 1 tablet (20 mg total) by mouth every evening., Disp: 90 tablet, Rfl: 3  Allergies:  Allergies  Allergen Reactions  . Hydrocodone-Acetaminophen Itching  . Other Itching    Flu-like symptoms from portabello mushrooms  . Codeine Itching  . Mushroom Extract Complex Nausea And Vomiting    Only a certain type of mushroom (name unknown) causes this reaction.    Past Medical History, Surgical history, Social history, and Family History were reviewed and updated.  Review of Systems: As above  Physical Exam:  height is 6' (1.829 m) and weight is 196 lb (88.905 kg). His oral temperature is 97.4 F (36.3 C). His blood pressure is 132/69 and his pulse is 52. His respiration is 16.   Wt Readings from Last 3 Encounters:  02/11/15 196 lb (88.905 kg)  02/03/15 196 lb 1.6 oz (88.95 kg)  01/27/15 195 lb (88.451 kg)     Well-developed and well-nourished white woman in no obvious distress. Head and neck exam shows no ocular or oral lesions. There are no palpable cervical or supraclavicular lymph nodes. Lungs are clear. Cardiac exam regular rate and rhythm with no murmurs, rubs or bruits. Abdomen is soft. He has good bowel sounds. There is no fluid wave. There is no palpable liver or spleen tip. Back exam shows no tenderness over the spine, ribs or hips. He is wearing a back brace. Extremities shows no clubbing, cyanosis or edema. There might be some slight swelling of the right lower leg. No obvious venous cord is noted. He has a negative Homans sign with the right leg. He has good pulses in his distal extremities. Neurological exam is nonfocal. Skin  exam shows no rashes, ecchymoses or petechia.   Impression and Plan: [name] [name] is 74 year old male. He has IgG kappa myeloma. He has had  5 cycles  of chemotherapy.  Again, he is responding.  His myeloma studies look better.His bone marrow biopsy clearly shows a nice response.  Since he is not to be seen at [facility] until mid January and I would anticipate that a transplant probably will not take place until February, I will keep him on Velcade. I will stop the Revlimid. I did this to be very reasonable.  I will give him 3 more weeks of Velcade.  As far as getting him back to see [place], the transplant physicians at [facility] will let us know when he is ready to come back.  I'm just very grateful that [name] [name] has done so well.     [name], MD 12/28/20168:42 AM

## 2015-02-11 NOTE — Telephone Encounter (Signed)
Potassium level 3.0 reported by lab .  Dr. Marin Olp notified.  No new orders received.  Pt on Potassium 40 meq daily

## 2015-02-16 LAB — BETA 2 MICROGLOBULIN, SERUM: BETA 2 MICROGLOBULIN: 2.5 mg/L (ref <=2.51)

## 2015-02-16 LAB — KAPPA/LAMBDA LIGHT CHAINS
KAPPA FREE LGHT CHN: 1.36 mg/dL (ref 0.33–1.94)
Kappa:Lambda Ratio: 1.25 (ref 0.26–1.65)
LAMBDA FREE LGHT CHN: 1.09 mg/dL (ref 0.57–2.63)

## 2015-02-16 LAB — PROTEIN ELECTROPHORESIS, SERUM, WITH REFLEX
ABNORMAL PROTEIN BAND2: 0.1 g/dL
ALBUMIN ELP: 3.2 g/dL — AB (ref 3.8–4.8)
ALPHA-1-GLOBULIN: 0.4 g/dL — AB (ref 0.2–0.3)
Abnormal Protein Band1: 0.3 g/dL
Abnormal Protein Band3: NOT DETECTED g/dL
Alpha-2-Globulin: 0.9 g/dL (ref 0.5–0.9)
Beta 2: 0.3 g/dL (ref 0.2–0.5)
Beta Globulin: 0.5 g/dL (ref 0.4–0.6)
GAMMA GLOBULIN: 0.6 g/dL — AB (ref 0.8–1.7)
Total Protein, Serum Electrophoresis: 6 g/dL — ABNORMAL LOW (ref 6.1–8.1)

## 2015-02-16 LAB — IFE INTERPRETATION

## 2015-02-16 LAB — IGG, IGA, IGM
IgA: 64 mg/dL — ABNORMAL LOW (ref 68–379)
IgG (Immunoglobin G), Serum: 798 mg/dL (ref 650–1600)
IgM, Serum: 26 mg/dL — ABNORMAL LOW (ref 41–251)

## 2015-02-18 ENCOUNTER — Ambulatory Visit (HOSPITAL_BASED_OUTPATIENT_CLINIC_OR_DEPARTMENT_OTHER): Payer: Medicare Other

## 2015-02-18 ENCOUNTER — Other Ambulatory Visit: Payer: Medicare Other

## 2015-02-18 VITALS — BP 128/70 | HR 57 | Temp 97.6°F | Resp 18

## 2015-02-18 DIAGNOSIS — C9000 Multiple myeloma not having achieved remission: Secondary | ICD-10-CM

## 2015-02-18 DIAGNOSIS — C9001 Multiple myeloma in remission: Secondary | ICD-10-CM

## 2015-02-18 DIAGNOSIS — Z5112 Encounter for antineoplastic immunotherapy: Secondary | ICD-10-CM

## 2015-02-18 MED ORDER — ONDANSETRON HCL 8 MG PO TABS
8.0000 mg | ORAL_TABLET | Freq: Once | ORAL | Status: AC
  Administered 2015-02-18: 8 mg via ORAL

## 2015-02-18 MED ORDER — BORTEZOMIB CHEMO SQ INJECTION 3.5 MG (2.5MG/ML)
1.3000 mg/m2 | Freq: Once | INTRAMUSCULAR | Status: AC
  Administered 2015-02-18: 2.75 mg via SUBCUTANEOUS
  Filled 2015-02-18: qty 2.75

## 2015-02-18 MED ORDER — ONDANSETRON HCL 8 MG PO TABS
ORAL_TABLET | ORAL | Status: AC
  Filled 2015-02-18: qty 1

## 2015-02-18 NOTE — Patient Instructions (Signed)
Bortezomib injection What is this medicine? BORTEZOMIB (bor TEZ oh mib) is a medicine that targets proteins in cancer cells and stops the cancer cells from growing. It is used to treat multiple myeloma and mantle-cell lymphoma. This medicine may be used for other purposes; ask your health care provider or pharmacist if you have questions. What should I tell my health care provider before I take this medicine? They need to know if you have any of these conditions: -diabetes -heart disease -irregular heartbeat -liver disease -on hemodialysis -low blood counts, like low white blood cells, platelets, or hemoglobin -peripheral neuropathy -taking medicine for blood pressure -an unusual or allergic reaction to bortezomib, mannitol, boron, other medicines, foods, dyes, or preservatives -pregnant or trying to get pregnant -breast-feeding How should I use this medicine? This medicine is for injection into a vein or for injection under the skin. It is given by a health care professional in a hospital or clinic setting. Talk to your pediatrician regarding the use of this medicine in children. Special care may be needed. Overdosage: If you think you have taken too much of this medicine contact a poison control center or emergency room at once. NOTE: This medicine is only for you. Do not share this medicine with others. What if I miss a dose? It is important not to miss your dose. Call your doctor or health care professional if you are unable to keep an appointment. What may interact with this medicine? This medicine may interact with the following medications: -ketoconazole -rifampin -ritonavir -St. John's Wort This list may not describe all possible interactions. Give your health care provider a list of all the medicines, herbs, non-prescription drugs, or dietary supplements you use. Also tell them if you smoke, drink alcohol, or use illegal drugs. Some items may interact with your medicine. What  should I watch for while using this medicine? Visit your doctor for checks on your progress. This drug may make you feel generally unwell. This is not uncommon, as chemotherapy can affect healthy cells as well as cancer cells. Report any side effects. Continue your course of treatment even though you feel ill unless your doctor tells you to stop. You may get drowsy or dizzy. Do not drive, use machinery, or do anything that needs mental alertness until you know how this medicine affects you. Do not stand or sit up quickly, especially if you are an older patient. This reduces the risk of dizzy or fainting spells. In some cases, you may be given additional medicines to help with side effects. Follow all directions for their use. Call your doctor or health care professional for advice if you get a fever, chills or sore throat, or other symptoms of a cold or flu. Do not treat yourself. This drug decreases your body's ability to fight infections. Try to avoid being around people who are sick. This medicine may increase your risk to bruise or bleed. Call your doctor or health care professional if you notice any unusual bleeding. You may need blood work done while you are taking this medicine. In some patients, this medicine may cause a serious brain infection that may cause death. If you have any problems seeing, thinking, speaking, walking, or standing, tell your doctor right away. If you cannot reach your doctor, urgently seek other source of medical care. Do not become pregnant while taking this medicine. Women should inform their doctor if they wish to become pregnant or think they might be pregnant. There is a potential for serious  side effects to an unborn child. Talk to your health care professional or pharmacist for more information. Do not breast-feed an infant while taking this medicine. Check with your doctor or health care professional if you get an attack of severe diarrhea, nausea and vomiting, or if  you sweat a lot. The loss of too much body fluid can make it dangerous for you to take this medicine. What side effects may I notice from receiving this medicine? Side effects that you should report to your doctor or health care professional as soon as possible: -allergic reactions like skin rash, itching or hives, swelling of the face, lips, or tongue -breathing problems -changes in hearing -changes in vision -fast, irregular heartbeat -feeling faint or lightheaded, falls -pain, tingling, numbness in the hands or feet -right upper belly pain -seizures -swelling of the ankles, feet, hands -unusual bleeding or bruising -unusually weak or tired -vomiting -yellowing of the eyes or skin Side effects that usually do not require medical attention (report to your doctor or health care professional if they continue or are bothersome): -changes in emotions or moods -constipation -diarrhea -loss of appetite -headache -irritation at site where injected -nausea This list may not describe all possible side effects. Call your doctor for medical advice about side effects. You may report side effects to FDA at 1-800-FDA-1088. Where should I keep my medicine? This drug is given in a hospital or clinic and will not be stored at home. NOTE: This sheet is a summary. It may not cover all possible information. If you have questions about this medicine, talk to your doctor, pharmacist, or health care provider.    2016, Elsevier/Gold Standard. (2014-04-01 14:47:04)

## 2015-02-23 DIAGNOSIS — K921 Melena: Secondary | ICD-10-CM | POA: Diagnosis not present

## 2015-02-23 DIAGNOSIS — K5900 Constipation, unspecified: Secondary | ICD-10-CM | POA: Diagnosis not present

## 2015-02-23 LAB — UIFE/LIGHT CHAINS/TP QN, 24-HR UR
ALBUMIN, U: DETECTED
Alpha 1, Urine: DETECTED — AB
Alpha 2, Urine: DETECTED — AB
BETA UR: DETECTED — AB
Gamma Globulin, Urine: DETECTED — AB
TOTAL PROTEIN, URINE-UPE24: 39 mg/dL — AB (ref 5–25)
Time: 24 hours
Volume, Urine: 2500 mL

## 2015-02-23 LAB — 24 HR URINE,KAPPA/LAMBDA LIGHT CHAINS
24H Urine Volume: 2500 mL/24 h
MEASURED KAPPA CHAIN: 1.58 mg/dL (ref <2.00)
MEASURED LAMBDA CHAIN: 0.54 mg/dL (ref <2.00)
TOTAL KAPPA CHAIN: 39.5 mg/(24.h)
TOTAL LAMBDA CHAIN: 13.5 mg/(24.h)

## 2015-02-24 ENCOUNTER — Ambulatory Visit (HOSPITAL_BASED_OUTPATIENT_CLINIC_OR_DEPARTMENT_OTHER): Payer: Medicare Other | Admitting: Hematology & Oncology

## 2015-02-24 ENCOUNTER — Ambulatory Visit (HOSPITAL_BASED_OUTPATIENT_CLINIC_OR_DEPARTMENT_OTHER): Payer: Medicare Other

## 2015-02-24 ENCOUNTER — Encounter: Payer: Self-pay | Admitting: Hematology & Oncology

## 2015-02-24 ENCOUNTER — Other Ambulatory Visit (HOSPITAL_BASED_OUTPATIENT_CLINIC_OR_DEPARTMENT_OTHER): Payer: Medicare Other

## 2015-02-24 VITALS — BP 133/68 | HR 79 | Temp 97.3°F | Resp 16 | Ht 72.0 in | Wt 199.0 lb

## 2015-02-24 DIAGNOSIS — C9001 Multiple myeloma in remission: Secondary | ICD-10-CM

## 2015-02-24 DIAGNOSIS — Z9484 Stem cells transplant status: Secondary | ICD-10-CM | POA: Diagnosis not present

## 2015-02-24 DIAGNOSIS — Z5112 Encounter for antineoplastic immunotherapy: Secondary | ICD-10-CM | POA: Diagnosis not present

## 2015-02-24 DIAGNOSIS — C9000 Multiple myeloma not having achieved remission: Secondary | ICD-10-CM

## 2015-02-24 LAB — CBC WITH DIFFERENTIAL (CANCER CENTER ONLY)
BASO#: 0 10*3/uL (ref 0.0–0.2)
BASO%: 0.3 % (ref 0.0–2.0)
EOS%: 0 % (ref 0.0–7.0)
Eosinophils Absolute: 0 10*3/uL (ref 0.0–0.5)
HEMATOCRIT: 32.2 % — AB (ref 38.7–49.9)
HEMOGLOBIN: 10.5 g/dL — AB (ref 13.0–17.1)
LYMPH#: 1.2 10*3/uL (ref 0.9–3.3)
LYMPH%: 16.4 % (ref 14.0–48.0)
MCH: 27.3 pg — ABNORMAL LOW (ref 28.0–33.4)
MCHC: 32.6 g/dL (ref 32.0–35.9)
MCV: 84 fL (ref 82–98)
MONO#: 0.7 10*3/uL (ref 0.1–0.9)
MONO%: 8.8 % (ref 0.0–13.0)
NEUT%: 74.5 % (ref 40.0–80.0)
NEUTROS ABS: 5.6 10*3/uL (ref 1.5–6.5)
Platelets: 250 10*3/uL (ref 145–400)
RBC: 3.84 10*6/uL — AB (ref 4.20–5.70)
RDW: 18 % — ABNORMAL HIGH (ref 11.1–15.7)
WBC: 7.5 10*3/uL (ref 4.0–10.0)

## 2015-02-24 LAB — CMP (CANCER CENTER ONLY)
ALBUMIN: 3.1 g/dL — AB (ref 3.3–5.5)
ALT(SGPT): 22 U/L (ref 10–47)
AST: 19 U/L (ref 11–38)
Alkaline Phosphatase: 82 U/L (ref 26–84)
BILIRUBIN TOTAL: 0.7 mg/dL (ref 0.20–1.60)
BUN: 18 mg/dL (ref 7–22)
CHLORIDE: 102 meq/L (ref 98–108)
CO2: 24 mEq/L (ref 18–33)
CREATININE: 0.9 mg/dL (ref 0.6–1.2)
Calcium: 8.8 mg/dL (ref 8.0–10.3)
Glucose, Bld: 191 mg/dL — ABNORMAL HIGH (ref 73–118)
Potassium: 3.5 mEq/L (ref 3.3–4.7)
SODIUM: 136 meq/L (ref 128–145)
TOTAL PROTEIN: 6.3 g/dL — AB (ref 6.4–8.1)

## 2015-02-24 MED ORDER — BORTEZOMIB CHEMO SQ INJECTION 3.5 MG (2.5MG/ML)
1.3000 mg/m2 | Freq: Once | INTRAMUSCULAR | Status: AC
  Administered 2015-02-24: 2.75 mg via SUBCUTANEOUS
  Filled 2015-02-24: qty 2.75

## 2015-02-24 MED ORDER — ONDANSETRON HCL 8 MG PO TABS
ORAL_TABLET | ORAL | Status: AC
  Filled 2015-02-24: qty 1

## 2015-02-24 MED ORDER — ONDANSETRON HCL 8 MG PO TABS
8.0000 mg | ORAL_TABLET | Freq: Once | ORAL | Status: AC
  Administered 2015-02-24: 8 mg via ORAL

## 2015-02-24 NOTE — Patient Instructions (Signed)
Bortezomib injection What is this medicine? BORTEZOMIB (bor TEZ oh mib) is a medicine that targets proteins in cancer cells and stops the cancer cells from growing. It is used to treat multiple myeloma and mantle-cell lymphoma. This medicine may be used for other purposes; ask your health care provider or pharmacist if you have questions. What should I tell my health care provider before I take this medicine? They need to know if you have any of these conditions: -diabetes -heart disease -irregular heartbeat -liver disease -on hemodialysis -low blood counts, like low white blood cells, platelets, or hemoglobin -peripheral neuropathy -taking medicine for blood pressure -an unusual or allergic reaction to bortezomib, mannitol, boron, other medicines, foods, dyes, or preservatives -pregnant or trying to get pregnant -breast-feeding How should I use this medicine? This medicine is for injection into a vein or for injection under the skin. It is given by a health care professional in a hospital or clinic setting. Talk to your pediatrician regarding the use of this medicine in children. Special care may be needed. Overdosage: If you think you have taken too much of this medicine contact a poison control center or emergency room at once. NOTE: This medicine is only for you. Do not share this medicine with others. What if I miss a dose? It is important not to miss your dose. Call your doctor or health care professional if you are unable to keep an appointment. What may interact with this medicine? This medicine may interact with the following medications: -ketoconazole -rifampin -ritonavir -St. John's Wort This list may not describe all possible interactions. Give your health care provider a list of all the medicines, herbs, non-prescription drugs, or dietary supplements you use. Also tell them if you smoke, drink alcohol, or use illegal drugs. Some items may interact with your medicine. What  should I watch for while using this medicine? Visit your doctor for checks on your progress. This drug may make you feel generally unwell. This is not uncommon, as chemotherapy can affect healthy cells as well as cancer cells. Report any side effects. Continue your course of treatment even though you feel ill unless your doctor tells you to stop. You may get drowsy or dizzy. Do not drive, use machinery, or do anything that needs mental alertness until you know how this medicine affects you. Do not stand or sit up quickly, especially if you are an older patient. This reduces the risk of dizzy or fainting spells. In some cases, you may be given additional medicines to help with side effects. Follow all directions for their use. Call your doctor or health care professional for advice if you get a fever, chills or sore throat, or other symptoms of a cold or flu. Do not treat yourself. This drug decreases your body's ability to fight infections. Try to avoid being around people who are sick. This medicine may increase your risk to bruise or bleed. Call your doctor or health care professional if you notice any unusual bleeding. You may need blood work done while you are taking this medicine. In some patients, this medicine may cause a serious brain infection that may cause death. If you have any problems seeing, thinking, speaking, walking, or standing, tell your doctor right away. If you cannot reach your doctor, urgently seek other source of medical care. Do not become pregnant while taking this medicine. Women should inform their doctor if they wish to become pregnant or think they might be pregnant. There is a potential for serious  side effects to an unborn child. Talk to your health care professional or pharmacist for more information. Do not breast-feed an infant while taking this medicine. Check with your doctor or health care professional if you get an attack of severe diarrhea, nausea and vomiting, or if  you sweat a lot. The loss of too much body fluid can make it dangerous for you to take this medicine. What side effects may I notice from receiving this medicine? Side effects that you should report to your doctor or health care professional as soon as possible: -allergic reactions like skin rash, itching or hives, swelling of the face, lips, or tongue -breathing problems -changes in hearing -changes in vision -fast, irregular heartbeat -feeling faint or lightheaded, falls -pain, tingling, numbness in the hands or feet -right upper belly pain -seizures -swelling of the ankles, feet, hands -unusual bleeding or bruising -unusually weak or tired -vomiting -yellowing of the eyes or skin Side effects that usually do not require medical attention (report to your doctor or health care professional if they continue or are bothersome): -changes in emotions or moods -constipation -diarrhea -loss of appetite -headache -irritation at site where injected -nausea This list may not describe all possible side effects. Call your doctor for medical advice about side effects. You may report side effects to FDA at 1-800-FDA-1088. Where should I keep my medicine? This drug is given in a hospital or clinic and will not be stored at home. NOTE: This sheet is a summary. It may not cover all possible information. If you have questions about this medicine, talk to your doctor, pharmacist, or health care provider.    2016, Elsevier/Gold Standard. (2014-04-01 14:47:04)

## 2015-02-24 NOTE — Progress Notes (Signed)
Hematology and Oncology Follow Up Visit  Hassaan Crite [id number] 01-29-1941 75 y.o. 02/24/2015   Principle Diagnosis:   IgG kappa myeloma - Trisomy 11 by FISH  Acute thromboembolism of the right lower leg  Current Therapy:    Patient  s/p cycle #5 of Velcade/Revlimid/Decadron  Zometa 4 mg IV every month       Xarelto 20 mg by mouth daily   History:  Mr. Guidroz is back for follow-up.  We did go ahead and do a bone marrow biopsy on him. This was after his fifth cycle of treatment. Thankfully, she bone marrow report ([id number]) showed a variably see a marrow with only 4% plasma cells. His cytogenetics and FISH were normal.  I referred him out to [facility] for a autologous stem cell transplant. I thought he would be a good candidate for this.  His last myeloma studies back in late December showed 2 monoclonal peaks. One measures 0.3 g/dL and the other measured 0.1 g/dL. His IgG level was down to 798 mg/dL. His Kappa Light chain was 1.36 mg/dL.  We did a 24-hour urine on him. This showed 39 mg of protein in his urine. He had 39.5 mg of Kappa light chain.  We did a another PET scan on him. There is no evidence of metabolically active bone tumors.  Again, he feels well. He's had a good New Year holiday.  He sees the transplant doctors at [facility] in mid January.  He's had a good appetite. He's had no nausea or vomiting. He's had no change in bowel or bladder habits. He's had no cough. He's had no rashes. He's had no leg swelling.  Overall, his performance status is ECOG 0.    Overall, I would say that his performance status is ECOG 0.    Medications:  Current outpatient prescriptions:  .  amLODipine (NORVASC) 5 MG tablet, Take 1 tablet (5 mg total) by mouth daily., Disp: 90 tablet, Rfl: 3 .  atorvastatin (LIPITOR) 20 MG tablet, Take 1 tablet (20 mg total) by mouth daily., Disp: 90 tablet, Rfl: 3 .  baclofen (LIORESAL) 10 MG tablet, , Disp: , Rfl:  .  carvedilol (COREG) 6.25 MG  tablet, Take 1 tablet (6.25 mg total) by mouth 2 (two) times daily with a meal., Disp: 180 tablet, Rfl: 3 .  dexamethasone (DECADRON) 4 MG tablet, Take 5 pills, with food, once a week. (Patient taking differently: Take 3 pills, with food, once a week.), Disp: 100 tablet, Rfl: 3 .  esomeprazole (NEXIUM) 40 MG capsule, Take 20 mg by mouth daily at 12 noon. , Disp: , Rfl:  .  famciclovir (FAMVIR) 500 MG tablet, Take 1 tablet (500 mg total) by mouth daily., Disp: 90 tablet, Rfl: 3 .  famotidine (PEPCID) 20 MG tablet, Take 1 tablet (20 mg total) by mouth 2 (two) times daily., Disp: 60 tablet, Rfl: 6 .  hydrochlorothiazide (MICROZIDE) 12.5 MG capsule, Take 1 capsule (12.5 mg total) by mouth daily., Disp: 90 capsule, Rfl: 3 .  isosorbide mononitrate (IMDUR) 30 MG 24 hr tablet, Take 1 tablet (30 mg total) by mouth 2 (two) times daily. (Patient taking differently: Take 30 mg by mouth 2 (two) times daily. PATIENT TAKES 60 MG DAILY), Disp: 180 tablet, Rfl: 3 .  lenalidomide (REVLIMID) 25 MG capsule, Take 1 pill a day at bedtime for 21 days and then stop for 7 days. [id number], Disp: 21 capsule, Rfl: 0 .  LORazepam (ATIVAN) 1 MG tablet, TAKE 1 TABLET AT BEDTIME, Disp:  90 tablet, Rfl: 1 .  montelukast (SINGULAIR) 10 MG tablet, Take 1 tablet (10 mg total) by mouth at bedtime., Disp: 90 tablet, Rfl: 3 .  Multiple Vitamin (MULTIVITAMIN) tablet, Take by mouth., Disp: , Rfl:  .  nitroGLYCERIN (NITROSTAT) 0.4 MG SL tablet, Place 0.4 mg under the tongue every 5 (five) minutes as needed. For chest pain, Disp: , Rfl:  .  ondansetron (ZOFRAN) 8 MG tablet, Take 1 tablet (8 mg total) by mouth 2 (two) times daily. Start the day after chemo for 2 days. Then take as needed for nausea or vomiting., Disp: 30 tablet, Rfl: 1 .  oxyCODONE-acetaminophen (PERCOCET) 10-325 MG tablet, Take 1 tablet by mouth 2 (two) times daily as needed for pain., Disp: 60 tablet, Rfl: 0 .  potassium chloride SA (K-DUR,KLOR-CON) 20 MEQ tablet, Take 2  tablets (40 mEq total) by mouth 2 (two) times daily. For 5 days then 40 meq daily, Disp: 90 tablet, Rfl: 2 .  prochlorperazine (COMPAZINE) 10 MG tablet, Take 1 tablet (10 mg total) by mouth every 6 (six) hours as needed (Nausea or vomiting)., Disp: 30 tablet, Rfl: 1 .  pyridOXINE (VITAMIN B-6) 50 MG tablet, Take 1 tablet (50 mg total) by mouth daily., Disp: 90 tablet, Rfl: 3 .  rivaroxaban (XARELTO) 20 MG TABS tablet, Take 1 tablet (20 mg total) by mouth daily with supper. 90 day supply, Disp: 90 tablet, Rfl: 3 .  simvastatin (ZOCOR) 20 MG tablet, Take 1 tablet (20 mg total) by mouth every evening., Disp: 90 tablet, Rfl: 3  Allergies:  Allergies  Allergen Reactions  . Hydrocodone-Acetaminophen Itching  . Other Itching    Flu-like symptoms from portabello mushrooms  . Codeine Itching  . Mushroom Extract Complex Nausea And Vomiting    Only a certain type of mushroom (name unknown) causes this reaction.    Past Medical History, Surgical history, Social history, and Family History were reviewed and updated.  Review of Systems: As above  Physical Exam:  height is 6' (1.829 m) and weight is 199 lb (90.266 kg). His oral temperature is 97.3 F (36.3 C). His blood pressure is 133/68 and his pulse is 79. His respiration is 16.   Wt Readings from Last 3 Encounters:  02/24/15 199 lb (90.266 kg)  02/11/15 196 lb (88.905 kg)  02/03/15 196 lb 1.6 oz (88.95 kg)     Well-developed and well-nourished white woman in no obvious distress. Head and neck exam shows no ocular or oral lesions. There are no palpable cervical or supraclavicular lymph nodes. Lungs are clear. Cardiac exam regular rate and rhythm with no murmurs, rubs or bruits. Abdomen is soft. He has good bowel sounds. There is no fluid wave. There is no palpable liver or spleen tip. Back exam shows no tenderness over the spine, ribs or hips. He is wearing a back brace. Extremities shows no clubbing, cyanosis or edema. There might be some  slight swelling of the right lower leg. No obvious venous cord is noted. He has a negative Homans sign with the right leg. He has good pulses in his distal extremities. Neurological exam is nonfocal. Skin exam shows no rashes, ecchymoses or petechia.   Impression and Plan: Mr. Prehn is 75 year old male. He has IgG kappa myeloma. He has had  5 cycles  of chemotherapy.  Again, he is responding.  His myeloma studies continue to look better.His bone marrow biopsy clearly shows a nice response. The fact that he now has normal cytogenetics is quite encouraging.  Since he is not to be seen at [facility] until mid January and I would anticipate that a transplant probably will not take place until February, if we need to do any additional Velcade on him, I am sure that [place] will let us know.   As far as getting him back to see [place], the transplant physicians at [facility] will let us know when he is ready to come back.  I'm just very grateful that Mr. Adriano has done so well.     [name], MD 1/10/20179:37 AM

## 2015-02-25 ENCOUNTER — Ambulatory Visit (INDEPENDENT_AMBULATORY_CARE_PROVIDER_SITE_OTHER): Payer: Medicare Other | Admitting: Sports Medicine

## 2015-02-25 ENCOUNTER — Encounter: Payer: Self-pay | Admitting: Sports Medicine

## 2015-02-25 VITALS — BP 139/78 | HR 58 | Temp 98.0°F | Resp 18 | Wt 196.7 lb

## 2015-02-25 DIAGNOSIS — C9001 Multiple myeloma in remission: Secondary | ICD-10-CM

## 2015-02-25 DIAGNOSIS — J45909 Unspecified asthma, uncomplicated: Secondary | ICD-10-CM

## 2015-02-25 DIAGNOSIS — J449 Chronic obstructive pulmonary disease, unspecified: Secondary | ICD-10-CM | POA: Diagnosis not present

## 2015-02-25 DIAGNOSIS — J4489 Other specified chronic obstructive pulmonary disease: Secondary | ICD-10-CM

## 2015-02-25 DIAGNOSIS — E119 Type 2 diabetes mellitus without complications: Secondary | ICD-10-CM

## 2015-02-25 NOTE — Addendum Note (Signed)
Addended by: Silverio Decamp on: 02/25/2015 10:36 AM   Modules accepted: Orders

## 2015-02-25 NOTE — Progress Notes (Signed)
  Subjective:    CC: Follow-up  HPI: COPD exacerbation: Resolved  Multiple myeloma: Currently in clinical remission, being evaluated for bone marrow transplantation with [facility].  Polypharmacy: Would like me to trim down some of his medications.  Past medical history, Surgical history, Family history not pertinant except as noted below, Social history, Allergies, and medications have been entered into the medical record, reviewed, and no changes needed.   Review of Systems: No fevers, chills, night sweats, weight loss, chest pain, or shortness of breath.   Objective:    General: Well Developed, well nourished, and in no acute distress.  Neuro: Alert and oriented x3, extra-ocular muscles intact, sensation grossly intact.  HEENT: Normocephalic, atraumatic, pupils equal round reactive to light, neck supple, no masses, no lymphadenopathy, thyroid nonpalpable.  Skin: Warm and dry, no rashes. Cardiac: Regular rate and rhythm, no murmurs rubs or gallops, no lower extremity edema.  Respiratory: Clear to auscultation bilaterally. Not using accessory muscles, speaking in full sentences.  Impression and Recommendations:    I spent 25 minutes with this patient, greater than 50% was face-to-face time counseling regarding the above diagnoses

## 2015-02-25 NOTE — Assessment & Plan Note (Signed)
Resolved

## 2015-02-25 NOTE — Assessment & Plan Note (Signed)
Currently in remission, he is being evaluated for bone marrow transplantation currently.

## 2015-03-02 ENCOUNTER — Encounter (HOSPITAL_COMMUNITY): Payer: Self-pay

## 2015-03-03 DIAGNOSIS — I2510 Atherosclerotic heart disease of native coronary artery without angina pectoris: Secondary | ICD-10-CM | POA: Diagnosis not present

## 2015-03-03 DIAGNOSIS — Z79899 Other long term (current) drug therapy: Secondary | ICD-10-CM | POA: Diagnosis not present

## 2015-03-03 DIAGNOSIS — Z87891 Personal history of nicotine dependence: Secondary | ICD-10-CM | POA: Diagnosis not present

## 2015-03-03 DIAGNOSIS — Z86711 Personal history of pulmonary embolism: Secondary | ICD-10-CM | POA: Diagnosis not present

## 2015-03-03 DIAGNOSIS — M4806 Spinal stenosis, lumbar region: Secondary | ICD-10-CM | POA: Diagnosis not present

## 2015-03-03 DIAGNOSIS — I10 Essential (primary) hypertension: Secondary | ICD-10-CM | POA: Diagnosis not present

## 2015-03-03 DIAGNOSIS — Z7901 Long term (current) use of anticoagulants: Secondary | ICD-10-CM | POA: Diagnosis not present

## 2015-03-03 DIAGNOSIS — C9000 Multiple myeloma not having achieved remission: Secondary | ICD-10-CM | POA: Diagnosis not present

## 2015-03-03 DIAGNOSIS — Z113 Encounter for screening for infections with a predominantly sexual mode of transmission: Secondary | ICD-10-CM | POA: Diagnosis not present

## 2015-03-03 DIAGNOSIS — Z114 Encounter for screening for human immunodeficiency virus [HIV]: Secondary | ICD-10-CM | POA: Diagnosis not present

## 2015-03-03 DIAGNOSIS — Z955 Presence of coronary angioplasty implant and graft: Secondary | ICD-10-CM | POA: Diagnosis not present

## 2015-03-03 DIAGNOSIS — Z86718 Personal history of other venous thrombosis and embolism: Secondary | ICD-10-CM | POA: Diagnosis not present

## 2015-03-03 DIAGNOSIS — Z1159 Encounter for screening for other viral diseases: Secondary | ICD-10-CM | POA: Diagnosis not present

## 2015-03-05 DIAGNOSIS — R001 Bradycardia, unspecified: Secondary | ICD-10-CM | POA: Diagnosis not present

## 2015-03-05 DIAGNOSIS — I4510 Unspecified right bundle-branch block: Secondary | ICD-10-CM | POA: Diagnosis not present

## 2015-03-05 DIAGNOSIS — R931 Abnormal findings on diagnostic imaging of heart and coronary circulation: Secondary | ICD-10-CM | POA: Diagnosis not present

## 2015-03-05 DIAGNOSIS — R918 Other nonspecific abnormal finding of lung field: Secondary | ICD-10-CM | POA: Diagnosis not present

## 2015-03-05 DIAGNOSIS — I517 Cardiomegaly: Secondary | ICD-10-CM | POA: Diagnosis not present

## 2015-03-05 DIAGNOSIS — Z79899 Other long term (current) drug therapy: Secondary | ICD-10-CM | POA: Diagnosis not present

## 2015-03-05 DIAGNOSIS — C9000 Multiple myeloma not having achieved remission: Secondary | ICD-10-CM | POA: Diagnosis not present

## 2015-03-05 DIAGNOSIS — J984 Other disorders of lung: Secondary | ICD-10-CM | POA: Diagnosis not present

## 2015-03-09 ENCOUNTER — Ambulatory Visit (INDEPENDENT_AMBULATORY_CARE_PROVIDER_SITE_OTHER): Payer: Medicare Other | Admitting: Sports Medicine

## 2015-03-09 ENCOUNTER — Other Ambulatory Visit: Payer: Self-pay | Admitting: Hematology & Oncology

## 2015-03-09 ENCOUNTER — Encounter: Payer: Self-pay | Admitting: Sports Medicine

## 2015-03-09 VITALS — BP 130/68 | HR 69 | Temp 98.0°F | Resp 16 | Ht 72.0 in | Wt 197.0 lb

## 2015-03-09 DIAGNOSIS — M7041 Prepatellar bursitis, right knee: Secondary | ICD-10-CM

## 2015-03-09 DIAGNOSIS — E119 Type 2 diabetes mellitus without complications: Secondary | ICD-10-CM

## 2015-03-09 DIAGNOSIS — C9001 Multiple myeloma in remission: Secondary | ICD-10-CM

## 2015-03-09 LAB — CBC
HCT: 33.4 % — ABNORMAL LOW (ref 39.0–52.0)
Hemoglobin: 10.7 g/dL — ABNORMAL LOW (ref 13.0–17.0)
MCH: 27.7 pg (ref 26.0–34.0)
MCHC: 32 g/dL (ref 30.0–36.0)
MCV: 86.5 fL (ref 78.0–100.0)
MPV: 9.7 fL (ref 8.6–12.4)
Platelets: 247 K/uL (ref 150–400)
RBC: 3.86 MIL/uL — ABNORMAL LOW (ref 4.22–5.81)
RDW: 19 % — ABNORMAL HIGH (ref 11.5–15.5)
WBC: 5.1 K/uL (ref 4.0–10.5)

## 2015-03-09 LAB — HEMOGLOBIN A1C
Hgb A1c MFr Bld: 6.1 % — ABNORMAL HIGH (ref <5.7)
Mean Plasma Glucose: 128 mg/dL — ABNORMAL HIGH (ref <117)

## 2015-03-09 MED ORDER — AMITRIPTYLINE HCL 25 MG PO TABS: 25.0000 mg | ORAL_TABLET | Freq: Every day | ORAL | Status: DC

## 2015-03-09 NOTE — Assessment & Plan Note (Addendum)
Foot exam today, microalbumin downstairs. Hemoglobin A1c down stairs. He is preparing for a bone marrow transplantation, we will avoid immunizations for now.  Peripheral neuropathy, insufficient response to gabapentin and Lyrica, adding amitriptyline.

## 2015-03-09 NOTE — Assessment & Plan Note (Signed)
Aspiration and injection as above. 

## 2015-03-09 NOTE — Progress Notes (Signed)
  Subjective:    CC:  Follow-up  HPI: Multiple myeloma:  Bone marrow transplantation will be next month.  Diabetes mellitus type 2: Due for some screening measures.  Right knee pain: Recent fall at the gas station, now with swelling And bruising over the anterior right patella. Pain is moderate, persistent without radiation.  Past medical history, Surgical history, Family history not pertinant except as noted below, Social history, Allergies, and medications have been entered into the medical record, reviewed, and no changes needed.   Review of Systems: No fevers, chills, night sweats, weight loss, chest pain, or shortness of breath.   Objective:    General: Well Developed, well nourished, and in no acute distress.  Neuro: Alert and oriented x3, extra-ocular muscles intact, sensation grossly intact.  HEENT: Normocephalic, atraumatic, pupils equal round reactive to light, neck supple, no masses, no lymphadenopathy, thyroid nonpalpable.  Skin: Warm and dry, no rashes. Cardiac: Regular rate and rhythm, no murmurs rubs or gallops, no lower extremity edema.  Respiratory: Clear to auscultation bilaterally. Not using accessory muscles, speaking in full sentences. Right Knee: Visibly swollen prepatellar bursa, excellent integrity of the extensor apparatus. ROM normal in flexion and extension and lower leg rotation. Ligaments with solid consistent endpoints including ACL, PCL, LCL, MCL. Negative Mcmurray's and provocative meniscal tests. Non painful patellar compression. Patellar and quadriceps tendons unremarkable. Hamstring and quadriceps strength is normal.  Procedure: Real-time Ultrasound Guided Injection of right prepatellar bursa Device: GE Logiq E  Verbal informed consent obtained.  Time-out conducted.  Noted no overlying erythema, induration, or other signs of local infection.  Skin prepped in a sterile fashion.  Local anesthesia: Topical Ethyl chloride.  With sterile technique  and under real time ultrasound guidance:  Minimal fluid aspirated, serosanguineous, syringe switched and 1 mL kenalog 40, 1 mL lidocaine injected easily. Completed without difficulty  Pain immediately resolved suggesting accurate placement of the medication.  Advised to call if fevers/chills, erythema, induration, drainage, or persistent bleeding.  Images permanently stored and available for review in the ultrasound unit.  Impression: Technically successful ultrasound guided injection.  Impression and Recommendations:

## 2015-03-09 NOTE — Assessment & Plan Note (Signed)
Bone marrow transportation coming up next month at [facility]

## 2015-03-10 LAB — COMPREHENSIVE METABOLIC PANEL WITH GFR
AST: 17 U/L (ref 10–35)
BUN: 16 mg/dL (ref 7–25)
CO2: 30 mmol/L (ref 20–31)
Chloride: 106 mmol/L (ref 98–110)
Creat: 0.93 mg/dL (ref 0.70–1.18)
Sodium: 141 mmol/L (ref 135–146)
Total Bilirubin: 0.8 mg/dL (ref 0.2–1.2)
Total Protein: 5.9 g/dL — ABNORMAL LOW (ref 6.1–8.1)

## 2015-03-10 LAB — COMPREHENSIVE METABOLIC PANEL
ALT: 19 U/L (ref 9–46)
Albumin: 3.6 g/dL (ref 3.6–5.1)
Alkaline Phosphatase: 80 U/L (ref 40–115)
Calcium: 8.8 mg/dL (ref 8.6–10.3)
Glucose, Bld: 108 mg/dL — ABNORMAL HIGH (ref 65–99)
Potassium: 3.6 mmol/L (ref 3.5–5.3)

## 2015-03-10 LAB — MICROALBUMIN / CREATININE URINE RATIO
Creatinine, Urine: 135 mg/dL (ref 20–370)
Microalb Creat Ratio: 13 ug/mg{creat} (ref <30)
Microalb, Ur: 1.8 mg/dL

## 2015-03-10 LAB — LIPID PANEL
Cholesterol: 133 mg/dL (ref 125–200)
HDL: 39 mg/dL — ABNORMAL LOW (ref >=40)
LDL Cholesterol: 54 mg/dL (ref <130)
Total CHOL/HDL Ratio: 3.4 ratio (ref <=5.0)
Triglycerides: 200 mg/dL — ABNORMAL HIGH (ref <150)
VLDL: 40 mg/dL — ABNORMAL HIGH (ref <30)

## 2015-03-10 LAB — VITAMIN D 25 HYDROXY (VIT D DEFICIENCY, FRACTURES): Vit D, 25-Hydroxy: 15 ng/mL — ABNORMAL LOW (ref 30–100)

## 2015-03-10 LAB — TSH: TSH: 0.836 u[IU]/mL (ref 0.350–4.500)

## 2015-03-12 LAB — TESTOS,TOTAL,FREE AND SHBG (FEMALE)
Sex Hormone Binding Glob.: 35 nmol/L (ref 22–77)
Testosterone, Free: 56.8 pg/mL (ref 30.0–135.0)
Testosterone,Total,LC/MS/MS: 364 ng/dL (ref 250–1100)

## 2015-03-12 MED ORDER — VITAMIN D (ERGOCALCIFEROL) 1.25 MG (50000 UNIT) PO CAPS: 50000.0000 [IU] | ORAL_CAPSULE | ORAL | Status: DC

## 2015-03-12 NOTE — Addendum Note (Signed)
Addended by: Silverio Decamp on: 03/12/2015 03:40 PM   Modules accepted: Orders

## 2015-03-18 DIAGNOSIS — Z949 Transplanted organ and tissue status, unspecified: Secondary | ICD-10-CM | POA: Insufficient documentation

## 2015-03-19 DIAGNOSIS — C9000 Multiple myeloma not having achieved remission: Secondary | ICD-10-CM | POA: Diagnosis not present

## 2015-03-23 ENCOUNTER — Encounter: Payer: Self-pay | Admitting: Sports Medicine

## 2015-03-23 DIAGNOSIS — E46 Unspecified protein-calorie malnutrition: Secondary | ICD-10-CM | POA: Diagnosis not present

## 2015-03-23 DIAGNOSIS — Z452 Encounter for adjustment and management of vascular access device: Secondary | ICD-10-CM | POA: Diagnosis not present

## 2015-03-23 DIAGNOSIS — Z52011 Autologous donor, stem cells: Secondary | ICD-10-CM | POA: Diagnosis not present

## 2015-03-23 DIAGNOSIS — C9000 Multiple myeloma not having achieved remission: Secondary | ICD-10-CM | POA: Diagnosis not present

## 2015-03-24 DIAGNOSIS — Z52011 Autologous donor, stem cells: Secondary | ICD-10-CM | POA: Diagnosis not present

## 2015-03-24 DIAGNOSIS — C9000 Multiple myeloma not having achieved remission: Secondary | ICD-10-CM | POA: Diagnosis not present

## 2015-03-24 DIAGNOSIS — Z006 Encounter for examination for normal comparison and control in clinical research program: Secondary | ICD-10-CM | POA: Insufficient documentation

## 2015-03-25 DIAGNOSIS — Z52011 Autologous donor, stem cells: Secondary | ICD-10-CM | POA: Diagnosis not present

## 2015-03-25 DIAGNOSIS — C9000 Multiple myeloma not having achieved remission: Secondary | ICD-10-CM | POA: Diagnosis not present

## 2015-03-26 DIAGNOSIS — Z52011 Autologous donor, stem cells: Secondary | ICD-10-CM | POA: Diagnosis not present

## 2015-03-26 DIAGNOSIS — C9000 Multiple myeloma not having achieved remission: Secondary | ICD-10-CM | POA: Diagnosis not present

## 2015-03-27 DIAGNOSIS — C9000 Multiple myeloma not having achieved remission: Secondary | ICD-10-CM | POA: Diagnosis not present

## 2015-03-30 ENCOUNTER — Ambulatory Visit: Payer: Medicare Other | Admitting: Family

## 2015-03-30 ENCOUNTER — Other Ambulatory Visit: Payer: Medicare Other

## 2015-03-30 ENCOUNTER — Ambulatory Visit: Payer: Medicare Other

## 2015-03-31 DIAGNOSIS — C9000 Multiple myeloma not having achieved remission: Secondary | ICD-10-CM | POA: Diagnosis not present

## 2015-03-31 DIAGNOSIS — I2510 Atherosclerotic heart disease of native coronary artery without angina pectoris: Secondary | ICD-10-CM | POA: Diagnosis not present

## 2015-03-31 DIAGNOSIS — E559 Vitamin D deficiency, unspecified: Secondary | ICD-10-CM | POA: Diagnosis not present

## 2015-03-31 DIAGNOSIS — I10 Essential (primary) hypertension: Secondary | ICD-10-CM | POA: Diagnosis not present

## 2015-04-01 DIAGNOSIS — I10 Essential (primary) hypertension: Secondary | ICD-10-CM | POA: Diagnosis not present

## 2015-04-01 DIAGNOSIS — Z79899 Other long term (current) drug therapy: Secondary | ICD-10-CM | POA: Diagnosis not present

## 2015-04-01 DIAGNOSIS — Z48298 Encounter for aftercare following other organ transplant: Secondary | ICD-10-CM | POA: Diagnosis not present

## 2015-04-01 DIAGNOSIS — K5900 Constipation, unspecified: Secondary | ICD-10-CM | POA: Diagnosis not present

## 2015-04-01 DIAGNOSIS — I2510 Atherosclerotic heart disease of native coronary artery without angina pectoris: Secondary | ICD-10-CM | POA: Diagnosis not present

## 2015-04-01 DIAGNOSIS — I452 Bifascicular block: Secondary | ICD-10-CM | POA: Diagnosis not present

## 2015-04-01 DIAGNOSIS — Z5111 Encounter for antineoplastic chemotherapy: Secondary | ICD-10-CM | POA: Diagnosis not present

## 2015-04-01 DIAGNOSIS — Z52011 Autologous donor, stem cells: Secondary | ICD-10-CM | POA: Diagnosis not present

## 2015-04-01 DIAGNOSIS — Z86718 Personal history of other venous thrombosis and embolism: Secondary | ICD-10-CM | POA: Diagnosis not present

## 2015-04-01 DIAGNOSIS — Z955 Presence of coronary angioplasty implant and graft: Secondary | ICD-10-CM | POA: Diagnosis not present

## 2015-04-01 DIAGNOSIS — C9000 Multiple myeloma not having achieved remission: Secondary | ICD-10-CM | POA: Diagnosis not present

## 2015-04-01 DIAGNOSIS — R9431 Abnormal electrocardiogram [ECG] [EKG]: Secondary | ICD-10-CM | POA: Insufficient documentation

## 2015-04-01 DIAGNOSIS — Z7901 Long term (current) use of anticoagulants: Secondary | ICD-10-CM | POA: Diagnosis not present

## 2015-04-01 DIAGNOSIS — G629 Polyneuropathy, unspecified: Secondary | ICD-10-CM | POA: Diagnosis not present

## 2015-04-01 DIAGNOSIS — I4581 Long QT syndrome: Secondary | ICD-10-CM | POA: Diagnosis not present

## 2015-04-01 DIAGNOSIS — Z9484 Stem cells transplant status: Secondary | ICD-10-CM | POA: Diagnosis not present

## 2015-04-02 DIAGNOSIS — Z52011 Autologous donor, stem cells: Secondary | ICD-10-CM | POA: Diagnosis not present

## 2015-04-02 DIAGNOSIS — I10 Essential (primary) hypertension: Secondary | ICD-10-CM | POA: Diagnosis not present

## 2015-04-02 DIAGNOSIS — K5900 Constipation, unspecified: Secondary | ICD-10-CM | POA: Diagnosis not present

## 2015-04-02 DIAGNOSIS — I2510 Atherosclerotic heart disease of native coronary artery without angina pectoris: Secondary | ICD-10-CM | POA: Diagnosis not present

## 2015-04-02 DIAGNOSIS — C9000 Multiple myeloma not having achieved remission: Secondary | ICD-10-CM | POA: Diagnosis not present

## 2015-04-02 DIAGNOSIS — Z7901 Long term (current) use of anticoagulants: Secondary | ICD-10-CM | POA: Diagnosis not present

## 2015-04-02 DIAGNOSIS — Z955 Presence of coronary angioplasty implant and graft: Secondary | ICD-10-CM | POA: Diagnosis not present

## 2015-04-02 DIAGNOSIS — Z713 Dietary counseling and surveillance: Secondary | ICD-10-CM | POA: Diagnosis not present

## 2015-04-02 DIAGNOSIS — Z6829 Body mass index (BMI) 29.0-29.9, adult: Secondary | ICD-10-CM | POA: Diagnosis not present

## 2015-04-02 DIAGNOSIS — R9431 Abnormal electrocardiogram [ECG] [EKG]: Secondary | ICD-10-CM | POA: Diagnosis not present

## 2015-04-02 DIAGNOSIS — Z86718 Personal history of other venous thrombosis and embolism: Secondary | ICD-10-CM | POA: Diagnosis not present

## 2015-04-02 DIAGNOSIS — G629 Polyneuropathy, unspecified: Secondary | ICD-10-CM | POA: Diagnosis not present

## 2015-04-03 DIAGNOSIS — R9431 Abnormal electrocardiogram [ECG] [EKG]: Secondary | ICD-10-CM | POA: Diagnosis not present

## 2015-04-03 DIAGNOSIS — Z52011 Autologous donor, stem cells: Secondary | ICD-10-CM | POA: Diagnosis not present

## 2015-04-03 DIAGNOSIS — C9000 Multiple myeloma not having achieved remission: Secondary | ICD-10-CM | POA: Diagnosis not present

## 2015-04-04 DIAGNOSIS — C9000 Multiple myeloma not having achieved remission: Secondary | ICD-10-CM | POA: Diagnosis not present

## 2015-04-04 DIAGNOSIS — Z52011 Autologous donor, stem cells: Secondary | ICD-10-CM | POA: Diagnosis not present

## 2015-04-04 DIAGNOSIS — R9431 Abnormal electrocardiogram [ECG] [EKG]: Secondary | ICD-10-CM | POA: Diagnosis not present

## 2015-04-05 DIAGNOSIS — Z52011 Autologous donor, stem cells: Secondary | ICD-10-CM | POA: Diagnosis not present

## 2015-04-05 DIAGNOSIS — R9431 Abnormal electrocardiogram [ECG] [EKG]: Secondary | ICD-10-CM | POA: Diagnosis not present

## 2015-04-05 DIAGNOSIS — C9000 Multiple myeloma not having achieved remission: Secondary | ICD-10-CM | POA: Diagnosis not present

## 2015-04-06 DIAGNOSIS — Z52011 Autologous donor, stem cells: Secondary | ICD-10-CM | POA: Diagnosis not present

## 2015-04-06 DIAGNOSIS — I951 Orthostatic hypotension: Secondary | ICD-10-CM | POA: Diagnosis not present

## 2015-04-06 DIAGNOSIS — Z86718 Personal history of other venous thrombosis and embolism: Secondary | ICD-10-CM | POA: Diagnosis not present

## 2015-04-06 DIAGNOSIS — I4581 Long QT syndrome: Secondary | ICD-10-CM | POA: Diagnosis not present

## 2015-04-06 DIAGNOSIS — R9431 Abnormal electrocardiogram [ECG] [EKG]: Secondary | ICD-10-CM | POA: Diagnosis not present

## 2015-04-06 DIAGNOSIS — Z9484 Stem cells transplant status: Secondary | ICD-10-CM | POA: Diagnosis not present

## 2015-04-06 DIAGNOSIS — C9000 Multiple myeloma not having achieved remission: Secondary | ICD-10-CM | POA: Diagnosis not present

## 2015-04-06 DIAGNOSIS — Z7901 Long term (current) use of anticoagulants: Secondary | ICD-10-CM | POA: Diagnosis not present

## 2015-04-06 DIAGNOSIS — G629 Polyneuropathy, unspecified: Secondary | ICD-10-CM | POA: Diagnosis not present

## 2015-04-07 DIAGNOSIS — C9000 Multiple myeloma not having achieved remission: Secondary | ICD-10-CM | POA: Diagnosis not present

## 2015-04-07 DIAGNOSIS — Z52011 Autologous donor, stem cells: Secondary | ICD-10-CM | POA: Diagnosis not present

## 2015-04-07 DIAGNOSIS — G629 Polyneuropathy, unspecified: Secondary | ICD-10-CM | POA: Diagnosis not present

## 2015-04-07 DIAGNOSIS — Z9484 Stem cells transplant status: Secondary | ICD-10-CM | POA: Diagnosis not present

## 2015-04-07 DIAGNOSIS — Z86718 Personal history of other venous thrombosis and embolism: Secondary | ICD-10-CM | POA: Diagnosis not present

## 2015-04-07 DIAGNOSIS — I10 Essential (primary) hypertension: Secondary | ICD-10-CM | POA: Diagnosis not present

## 2015-04-07 DIAGNOSIS — R9431 Abnormal electrocardiogram [ECG] [EKG]: Secondary | ICD-10-CM | POA: Diagnosis not present

## 2015-04-08 DIAGNOSIS — Z955 Presence of coronary angioplasty implant and graft: Secondary | ICD-10-CM | POA: Diagnosis not present

## 2015-04-08 DIAGNOSIS — G629 Polyneuropathy, unspecified: Secondary | ICD-10-CM | POA: Diagnosis not present

## 2015-04-08 DIAGNOSIS — Z8719 Personal history of other diseases of the digestive system: Secondary | ICD-10-CM | POA: Diagnosis not present

## 2015-04-08 DIAGNOSIS — R198 Other specified symptoms and signs involving the digestive system and abdomen: Secondary | ICD-10-CM | POA: Diagnosis not present

## 2015-04-08 DIAGNOSIS — Z86718 Personal history of other venous thrombosis and embolism: Secondary | ICD-10-CM | POA: Diagnosis not present

## 2015-04-08 DIAGNOSIS — R9431 Abnormal electrocardiogram [ECG] [EKG]: Secondary | ICD-10-CM | POA: Diagnosis not present

## 2015-04-08 DIAGNOSIS — Z9484 Stem cells transplant status: Secondary | ICD-10-CM | POA: Diagnosis not present

## 2015-04-08 DIAGNOSIS — Z79899 Other long term (current) drug therapy: Secondary | ICD-10-CM | POA: Diagnosis not present

## 2015-04-08 DIAGNOSIS — Z52011 Autologous donor, stem cells: Secondary | ICD-10-CM | POA: Diagnosis not present

## 2015-04-08 DIAGNOSIS — Z7901 Long term (current) use of anticoagulants: Secondary | ICD-10-CM | POA: Diagnosis not present

## 2015-04-08 DIAGNOSIS — I10 Essential (primary) hypertension: Secondary | ICD-10-CM | POA: Diagnosis not present

## 2015-04-08 DIAGNOSIS — C9000 Multiple myeloma not having achieved remission: Secondary | ICD-10-CM | POA: Diagnosis not present

## 2015-04-08 DIAGNOSIS — Z87891 Personal history of nicotine dependence: Secondary | ICD-10-CM | POA: Diagnosis not present

## 2015-04-08 DIAGNOSIS — A047 Enterocolitis due to Clostridium difficile: Secondary | ICD-10-CM | POA: Diagnosis not present

## 2015-04-08 DIAGNOSIS — I25119 Atherosclerotic heart disease of native coronary artery with unspecified angina pectoris: Secondary | ICD-10-CM | POA: Diagnosis not present

## 2015-04-09 DIAGNOSIS — I2510 Atherosclerotic heart disease of native coronary artery without angina pectoris: Secondary | ICD-10-CM | POA: Diagnosis not present

## 2015-04-09 DIAGNOSIS — Z86711 Personal history of pulmonary embolism: Secondary | ICD-10-CM | POA: Diagnosis not present

## 2015-04-09 DIAGNOSIS — I10 Essential (primary) hypertension: Secondary | ICD-10-CM | POA: Diagnosis not present

## 2015-04-09 DIAGNOSIS — Z955 Presence of coronary angioplasty implant and graft: Secondary | ICD-10-CM | POA: Diagnosis not present

## 2015-04-09 DIAGNOSIS — K449 Diaphragmatic hernia without obstruction or gangrene: Secondary | ICD-10-CM | POA: Diagnosis not present

## 2015-04-09 DIAGNOSIS — D709 Neutropenia, unspecified: Secondary | ICD-10-CM | POA: Diagnosis not present

## 2015-04-09 DIAGNOSIS — Z452 Encounter for adjustment and management of vascular access device: Secondary | ICD-10-CM | POA: Diagnosis not present

## 2015-04-09 DIAGNOSIS — J189 Pneumonia, unspecified organism: Secondary | ICD-10-CM | POA: Diagnosis not present

## 2015-04-09 DIAGNOSIS — Z79899 Other long term (current) drug therapy: Secondary | ICD-10-CM | POA: Diagnosis not present

## 2015-04-09 DIAGNOSIS — C9000 Multiple myeloma not having achieved remission: Secondary | ICD-10-CM | POA: Diagnosis not present

## 2015-04-09 DIAGNOSIS — Z86718 Personal history of other venous thrombosis and embolism: Secondary | ICD-10-CM | POA: Diagnosis not present

## 2015-04-09 DIAGNOSIS — R5081 Fever presenting with conditions classified elsewhere: Secondary | ICD-10-CM | POA: Diagnosis not present

## 2015-04-09 DIAGNOSIS — Z9484 Stem cells transplant status: Secondary | ICD-10-CM | POA: Diagnosis not present

## 2015-04-09 DIAGNOSIS — R079 Chest pain, unspecified: Secondary | ICD-10-CM | POA: Diagnosis not present

## 2015-04-09 DIAGNOSIS — A047 Enterocolitis due to Clostridium difficile: Secondary | ICD-10-CM | POA: Diagnosis not present

## 2015-04-09 DIAGNOSIS — R918 Other nonspecific abnormal finding of lung field: Secondary | ICD-10-CM | POA: Diagnosis not present

## 2015-04-09 DIAGNOSIS — Z87891 Personal history of nicotine dependence: Secondary | ICD-10-CM | POA: Diagnosis not present

## 2015-04-10 DIAGNOSIS — J479 Bronchiectasis, uncomplicated: Secondary | ICD-10-CM | POA: Diagnosis not present

## 2015-04-10 DIAGNOSIS — Z452 Encounter for adjustment and management of vascular access device: Secondary | ICD-10-CM | POA: Diagnosis not present

## 2015-04-10 DIAGNOSIS — K449 Diaphragmatic hernia without obstruction or gangrene: Secondary | ICD-10-CM | POA: Diagnosis not present

## 2015-04-10 DIAGNOSIS — R918 Other nonspecific abnormal finding of lung field: Secondary | ICD-10-CM | POA: Diagnosis not present

## 2015-04-10 DIAGNOSIS — A047 Enterocolitis due to Clostridium difficile: Secondary | ICD-10-CM | POA: Diagnosis not present

## 2015-04-10 DIAGNOSIS — R5081 Fever presenting with conditions classified elsewhere: Secondary | ICD-10-CM | POA: Diagnosis not present

## 2015-04-10 DIAGNOSIS — Z9484 Stem cells transplant status: Secondary | ICD-10-CM | POA: Diagnosis not present

## 2015-04-10 DIAGNOSIS — R079 Chest pain, unspecified: Secondary | ICD-10-CM | POA: Insufficient documentation

## 2015-04-10 DIAGNOSIS — J189 Pneumonia, unspecified organism: Secondary | ICD-10-CM | POA: Diagnosis not present

## 2015-04-10 DIAGNOSIS — C9000 Multiple myeloma not having achieved remission: Secondary | ICD-10-CM | POA: Diagnosis not present

## 2015-04-10 DIAGNOSIS — R9431 Abnormal electrocardiogram [ECG] [EKG]: Secondary | ICD-10-CM | POA: Diagnosis not present

## 2015-04-10 DIAGNOSIS — D709 Neutropenia, unspecified: Secondary | ICD-10-CM | POA: Diagnosis not present

## 2015-04-11 DIAGNOSIS — Z9484 Stem cells transplant status: Secondary | ICD-10-CM | POA: Diagnosis not present

## 2015-04-11 DIAGNOSIS — I82409 Acute embolism and thrombosis of unspecified deep veins of unspecified lower extremity: Secondary | ICD-10-CM | POA: Diagnosis not present

## 2015-04-11 DIAGNOSIS — G629 Polyneuropathy, unspecified: Secondary | ICD-10-CM | POA: Diagnosis not present

## 2015-04-11 DIAGNOSIS — Z52011 Autologous donor, stem cells: Secondary | ICD-10-CM | POA: Diagnosis not present

## 2015-04-11 DIAGNOSIS — I10 Essential (primary) hypertension: Secondary | ICD-10-CM | POA: Diagnosis not present

## 2015-04-11 DIAGNOSIS — C9000 Multiple myeloma not having achieved remission: Secondary | ICD-10-CM | POA: Diagnosis not present

## 2015-04-11 DIAGNOSIS — Z79899 Other long term (current) drug therapy: Secondary | ICD-10-CM | POA: Diagnosis not present

## 2015-04-11 DIAGNOSIS — R9431 Abnormal electrocardiogram [ECG] [EKG]: Secondary | ICD-10-CM | POA: Diagnosis not present

## 2015-04-11 DIAGNOSIS — I2510 Atherosclerotic heart disease of native coronary artery without angina pectoris: Secondary | ICD-10-CM | POA: Diagnosis not present

## 2015-04-12 DIAGNOSIS — Z87891 Personal history of nicotine dependence: Secondary | ICD-10-CM | POA: Diagnosis not present

## 2015-04-12 DIAGNOSIS — I4581 Long QT syndrome: Secondary | ICD-10-CM | POA: Diagnosis not present

## 2015-04-12 DIAGNOSIS — R9431 Abnormal electrocardiogram [ECG] [EKG]: Secondary | ICD-10-CM | POA: Diagnosis not present

## 2015-04-12 DIAGNOSIS — Z52011 Autologous donor, stem cells: Secondary | ICD-10-CM | POA: Diagnosis not present

## 2015-04-12 DIAGNOSIS — C9000 Multiple myeloma not having achieved remission: Secondary | ICD-10-CM | POA: Diagnosis not present

## 2015-04-12 DIAGNOSIS — Z9484 Stem cells transplant status: Secondary | ICD-10-CM | POA: Diagnosis not present

## 2015-04-12 DIAGNOSIS — G629 Polyneuropathy, unspecified: Secondary | ICD-10-CM | POA: Diagnosis not present

## 2015-04-12 DIAGNOSIS — I10 Essential (primary) hypertension: Secondary | ICD-10-CM | POA: Diagnosis not present

## 2015-04-13 DIAGNOSIS — Z9484 Stem cells transplant status: Secondary | ICD-10-CM | POA: Diagnosis not present

## 2015-04-13 DIAGNOSIS — R9431 Abnormal electrocardiogram [ECG] [EKG]: Secondary | ICD-10-CM | POA: Diagnosis not present

## 2015-04-13 DIAGNOSIS — C9000 Multiple myeloma not having achieved remission: Secondary | ICD-10-CM | POA: Diagnosis not present

## 2015-04-13 DIAGNOSIS — Z52011 Autologous donor, stem cells: Secondary | ICD-10-CM | POA: Diagnosis not present

## 2015-04-14 DIAGNOSIS — C9000 Multiple myeloma not having achieved remission: Secondary | ICD-10-CM | POA: Diagnosis not present

## 2015-04-14 DIAGNOSIS — R05 Cough: Secondary | ICD-10-CM | POA: Diagnosis not present

## 2015-04-14 DIAGNOSIS — R9431 Abnormal electrocardiogram [ECG] [EKG]: Secondary | ICD-10-CM | POA: Diagnosis not present

## 2015-04-14 DIAGNOSIS — Z9484 Stem cells transplant status: Secondary | ICD-10-CM | POA: Diagnosis not present

## 2015-04-14 DIAGNOSIS — Z52011 Autologous donor, stem cells: Secondary | ICD-10-CM | POA: Diagnosis not present

## 2015-04-14 DIAGNOSIS — R053 Chronic cough: Secondary | ICD-10-CM | POA: Insufficient documentation

## 2015-04-15 DIAGNOSIS — Z52011 Autologous donor, stem cells: Secondary | ICD-10-CM | POA: Diagnosis not present

## 2015-04-15 DIAGNOSIS — R918 Other nonspecific abnormal finding of lung field: Secondary | ICD-10-CM | POA: Diagnosis not present

## 2015-04-15 DIAGNOSIS — C9000 Multiple myeloma not having achieved remission: Secondary | ICD-10-CM | POA: Diagnosis not present

## 2015-04-15 DIAGNOSIS — J189 Pneumonia, unspecified organism: Secondary | ICD-10-CM | POA: Insufficient documentation

## 2015-04-15 DIAGNOSIS — Z9484 Stem cells transplant status: Secondary | ICD-10-CM | POA: Diagnosis not present

## 2015-04-15 DIAGNOSIS — R9431 Abnormal electrocardiogram [ECG] [EKG]: Secondary | ICD-10-CM | POA: Diagnosis not present

## 2015-04-16 DIAGNOSIS — Z79899 Other long term (current) drug therapy: Secondary | ICD-10-CM | POA: Diagnosis not present

## 2015-04-16 DIAGNOSIS — J984 Other disorders of lung: Secondary | ICD-10-CM | POA: Diagnosis not present

## 2015-04-16 DIAGNOSIS — Z86718 Personal history of other venous thrombosis and embolism: Secondary | ICD-10-CM | POA: Diagnosis not present

## 2015-04-16 DIAGNOSIS — C9000 Multiple myeloma not having achieved remission: Secondary | ICD-10-CM | POA: Diagnosis not present

## 2015-04-16 DIAGNOSIS — Z7901 Long term (current) use of anticoagulants: Secondary | ICD-10-CM | POA: Diagnosis not present

## 2015-04-16 DIAGNOSIS — Z9484 Stem cells transplant status: Secondary | ICD-10-CM | POA: Diagnosis not present

## 2015-04-16 DIAGNOSIS — J479 Bronchiectasis, uncomplicated: Secondary | ICD-10-CM | POA: Diagnosis present

## 2015-04-16 DIAGNOSIS — I10 Essential (primary) hypertension: Secondary | ICD-10-CM | POA: Diagnosis present

## 2015-04-16 DIAGNOSIS — Z803 Family history of malignant neoplasm of breast: Secondary | ICD-10-CM | POA: Diagnosis not present

## 2015-04-16 DIAGNOSIS — A047 Enterocolitis due to Clostridium difficile: Secondary | ICD-10-CM | POA: Diagnosis present

## 2015-04-16 DIAGNOSIS — Z7409 Other reduced mobility: Secondary | ICD-10-CM | POA: Diagnosis not present

## 2015-04-16 DIAGNOSIS — M8580 Other specified disorders of bone density and structure, unspecified site: Secondary | ICD-10-CM | POA: Diagnosis present

## 2015-04-16 DIAGNOSIS — Z52011 Autologous donor, stem cells: Secondary | ICD-10-CM | POA: Diagnosis not present

## 2015-04-16 DIAGNOSIS — R768 Other specified abnormal immunological findings in serum: Secondary | ICD-10-CM | POA: Diagnosis not present

## 2015-04-16 DIAGNOSIS — R05 Cough: Secondary | ICD-10-CM | POA: Diagnosis not present

## 2015-04-16 DIAGNOSIS — R918 Other nonspecific abnormal finding of lung field: Secondary | ICD-10-CM | POA: Diagnosis not present

## 2015-04-16 DIAGNOSIS — Z955 Presence of coronary angioplasty implant and graft: Secondary | ICD-10-CM | POA: Diagnosis not present

## 2015-04-16 DIAGNOSIS — D899 Disorder involving the immune mechanism, unspecified: Secondary | ICD-10-CM | POA: Diagnosis not present

## 2015-04-16 DIAGNOSIS — I4581 Long QT syndrome: Secondary | ICD-10-CM | POA: Diagnosis present

## 2015-04-16 DIAGNOSIS — R9431 Abnormal electrocardiogram [ECG] [EKG]: Secondary | ICD-10-CM | POA: Diagnosis not present

## 2015-04-16 DIAGNOSIS — Z86711 Personal history of pulmonary embolism: Secondary | ICD-10-CM | POA: Diagnosis not present

## 2015-04-16 DIAGNOSIS — K449 Diaphragmatic hernia without obstruction or gangrene: Secondary | ICD-10-CM | POA: Diagnosis present

## 2015-04-16 DIAGNOSIS — Z119 Encounter for screening for infectious and parasitic diseases, unspecified: Secondary | ICD-10-CM | POA: Diagnosis not present

## 2015-04-16 DIAGNOSIS — J189 Pneumonia, unspecified organism: Secondary | ICD-10-CM | POA: Diagnosis not present

## 2015-04-16 DIAGNOSIS — G629 Polyneuropathy, unspecified: Secondary | ICD-10-CM | POA: Diagnosis present

## 2015-04-16 DIAGNOSIS — K5900 Constipation, unspecified: Secondary | ICD-10-CM | POA: Diagnosis present

## 2015-04-16 DIAGNOSIS — I2510 Atherosclerotic heart disease of native coronary artery without angina pectoris: Secondary | ICD-10-CM | POA: Diagnosis present

## 2015-04-16 DIAGNOSIS — J9811 Atelectasis: Secondary | ICD-10-CM | POA: Diagnosis not present

## 2015-04-16 DIAGNOSIS — Z87891 Personal history of nicotine dependence: Secondary | ICD-10-CM | POA: Diagnosis not present

## 2015-04-20 ENCOUNTER — Telehealth: Payer: Self-pay | Admitting: *Deleted

## 2015-04-20 NOTE — Telephone Encounter (Signed)
Received update from [facility] stating patient will be discharged from transplant unit today and will follow up with this office this week.   His transplant course was complicated by one occurrence of febrile neutropenia requiring readmission and IV antibiotics. Patient will be discharged on antifungals.  Dr Marin Olp aware of update.

## 2015-04-21 DIAGNOSIS — Z452 Encounter for adjustment and management of vascular access device: Secondary | ICD-10-CM | POA: Diagnosis not present

## 2015-04-21 DIAGNOSIS — Z955 Presence of coronary angioplasty implant and graft: Secondary | ICD-10-CM | POA: Diagnosis not present

## 2015-04-21 DIAGNOSIS — I2510 Atherosclerotic heart disease of native coronary artery without angina pectoris: Secondary | ICD-10-CM | POA: Diagnosis not present

## 2015-04-21 DIAGNOSIS — Z79899 Other long term (current) drug therapy: Secondary | ICD-10-CM | POA: Diagnosis not present

## 2015-04-21 DIAGNOSIS — Z7901 Long term (current) use of anticoagulants: Secondary | ICD-10-CM | POA: Diagnosis not present

## 2015-04-21 DIAGNOSIS — G629 Polyneuropathy, unspecified: Secondary | ICD-10-CM | POA: Diagnosis not present

## 2015-04-21 DIAGNOSIS — C9000 Multiple myeloma not having achieved remission: Secondary | ICD-10-CM | POA: Diagnosis not present

## 2015-04-21 DIAGNOSIS — Z9484 Stem cells transplant status: Secondary | ICD-10-CM | POA: Diagnosis not present

## 2015-04-21 DIAGNOSIS — Z792 Long term (current) use of antibiotics: Secondary | ICD-10-CM | POA: Diagnosis not present

## 2015-04-21 DIAGNOSIS — J189 Pneumonia, unspecified organism: Secondary | ICD-10-CM | POA: Diagnosis not present

## 2015-04-21 DIAGNOSIS — Z86718 Personal history of other venous thrombosis and embolism: Secondary | ICD-10-CM | POA: Diagnosis not present

## 2015-04-21 DIAGNOSIS — R05 Cough: Secondary | ICD-10-CM | POA: Diagnosis not present

## 2015-04-22 ENCOUNTER — Ambulatory Visit: Payer: Medicare Other | Admitting: Sports Medicine

## 2015-04-23 ENCOUNTER — Ambulatory Visit (HOSPITAL_BASED_OUTPATIENT_CLINIC_OR_DEPARTMENT_OTHER): Payer: Medicare Other | Admitting: Hematology & Oncology

## 2015-04-23 ENCOUNTER — Other Ambulatory Visit: Payer: Self-pay | Admitting: *Deleted

## 2015-04-23 ENCOUNTER — Encounter: Payer: Self-pay | Admitting: Hematology & Oncology

## 2015-04-23 ENCOUNTER — Other Ambulatory Visit (HOSPITAL_BASED_OUTPATIENT_CLINIC_OR_DEPARTMENT_OTHER): Payer: Medicare Other

## 2015-04-23 VITALS — BP 123/74 | HR 75 | Temp 97.7°F | Resp 16 | Ht 72.0 in | Wt 190.0 lb

## 2015-04-23 DIAGNOSIS — C9001 Multiple myeloma in remission: Secondary | ICD-10-CM

## 2015-04-23 DIAGNOSIS — C9000 Multiple myeloma not having achieved remission: Secondary | ICD-10-CM

## 2015-04-23 DIAGNOSIS — C9002 Multiple myeloma in relapse: Secondary | ICD-10-CM

## 2015-04-23 LAB — CBC WITH DIFFERENTIAL (CANCER CENTER ONLY)
BASO#: 0.1 10*3/uL (ref 0.0–0.2)
BASO%: 1.7 % (ref 0.0–2.0)
EOS ABS: 0 10*3/uL (ref 0.0–0.5)
EOS%: 0.2 % (ref 0.0–7.0)
HCT: 31.3 % — ABNORMAL LOW (ref 38.7–49.9)
HGB: 10.3 g/dL — ABNORMAL LOW (ref 13.0–17.1)
LYMPH#: 0.9 10*3/uL (ref 0.9–3.3)
LYMPH%: 16 % (ref 14.0–48.0)
MCH: 29.1 pg (ref 28.0–33.4)
MCHC: 32.9 g/dL (ref 32.0–35.9)
MCV: 88 fL (ref 82–98)
MONO#: 1.2 10*3/uL — AB (ref 0.1–0.9)
MONO%: 21.3 % — AB (ref 0.0–13.0)
NEUT#: 3.3 10*3/uL (ref 1.5–6.5)
NEUT%: 60.8 % (ref 40.0–80.0)
PLATELETS: 486 10*3/uL — AB (ref 145–400)
RBC: 3.54 10*6/uL — AB (ref 4.20–5.70)
RDW: 17.4 % — AB (ref 11.1–15.7)
WBC: 5.4 10*3/uL (ref 4.0–10.0)

## 2015-04-23 LAB — COMPREHENSIVE METABOLIC PANEL
ALT: 39 U/L (ref 0–55)
ANION GAP: 8 meq/L (ref 3–11)
AST: 45 U/L — ABNORMAL HIGH (ref 5–34)
Albumin: 2.6 g/dL — ABNORMAL LOW (ref 3.5–5.0)
Alkaline Phosphatase: 133 U/L (ref 40–150)
BUN: 12.9 mg/dL (ref 7.0–26.0)
CHLORIDE: 103 meq/L (ref 98–109)
CO2: 26 meq/L (ref 22–29)
Calcium: 9.6 mg/dL (ref 8.4–10.4)
Creatinine: 1 mg/dL (ref 0.7–1.3)
EGFR: 74 mL/min/{1.73_m2} — AB (ref >90)
GLUCOSE: 113 mg/dL (ref 70–140)
POTASSIUM: 5.2 meq/L — AB (ref 3.5–5.1)
SODIUM: 137 meq/L (ref 136–145)
TOTAL PROTEIN: 6.8 g/dL (ref 6.4–8.3)
Total Bilirubin: <0.3 mg/dL (ref 0.20–1.20)

## 2015-04-23 MED ORDER — PROCHLORPERAZINE MALEATE 10 MG PO TABS: 10.0000 mg | ORAL_TABLET | Freq: Four times a day (QID) | ORAL | Status: DC | PRN

## 2015-04-24 NOTE — Progress Notes (Signed)
Hematology and Oncology Follow Up Visit  Kaleel Plett [id number] 1940-10-02 75 y.o. 04/24/2015   Principle Diagnosis:   IgG kappa myeloma - Trisomy 11 by FISH  Acute thromboembolism of the right lower leg  Current Therapy:    Patient  s/p cycle #5 of Velcade/Revlimid/Decadron  S/P ASCT at [facility] on 04/02/2015  Zometa 4 mg IV every month       Xarelto 20 mg by mouth daily   History:  Mr. Ansari is back for follow-up.  He looks quite good. He did undergo his stem cell transplant at [facility]. Unfortunately, he had some complications with the transplant. He had Clostridium infection. Had a lot of diarrhea. He also had what appeared to be a fungal-type pneumonia. Thankfully, both seem to be improving. He is on oral vancomycin. I think he also is taking some oral posaconazole.  He feels pretty good. He feels tired which is certainly to be expected.  He's had no rashes. He's had no proximal with shingles reactivation.  The diarrhea is much better right now.  He's had no fever. He's had no bleeding. He's had no joint problems.  He's had no leg swelling. He is trying to exercise a little bit more.  He's had no mouth sores. He's had no dysphagia or odynophagia.  Overall, is performance status is ECOG 1..    Medications:  Current outpatient prescriptions:  .  amitriptyline (ELAVIL) 25 MG tablet, Take 1 tablet (25 mg total) by mouth at bedtime., Disp: 30 tablet, Rfl: 3 .  atorvastatin (LIPITOR) 20 MG tablet, Take 1 tablet (20 mg total) by mouth daily., Disp: 90 tablet, Rfl: 3 .  carvedilol (COREG) 6.25 MG tablet, Take 1 tablet (6.25 mg total) by mouth 2 (two) times daily with a meal., Disp: 180 tablet, Rfl: 3 .  Cyanocobalamin (RA VITAMIN B-12 TR) 1000 MCG TBCR, Take by mouth., Disp: , Rfl:  .  esomeprazole (NEXIUM) 40 MG capsule, Take 20 mg by mouth daily at 12 noon. , Disp: , Rfl:  .  famciclovir (FAMVIR) 500 MG tablet, Take 1 tablet (500 mg total) by mouth daily., Disp: 90 tablet,  Rfl: 3 .  isosorbide mononitrate (IMDUR) 30 MG 24 hr tablet, Take 1 tablet (30 mg total) by mouth 2 (two) times daily. (Patient taking differently: Take 30 mg by mouth 2 (two) times daily. PATIENT TAKES 60 MG DAILY), Disp: 180 tablet, Rfl: 3 .  Loratadine 10 MG CAPS, Take by mouth., Disp: , Rfl:  .  LORazepam (ATIVAN) 1 MG tablet, TAKE 1 TABLET AT BEDTIME, Disp: 90 tablet, Rfl: 1 .  montelukast (SINGULAIR) 10 MG tablet, Take 1 tablet (10 mg total) by mouth at bedtime., Disp: 90 tablet, Rfl: 3 .  nitroGLYCERIN (NITROSTAT) 0.4 MG SL tablet, Place 0.4 mg under the tongue every 5 (five) minutes as needed. For chest pain, Disp: , Rfl:  .  oxyCODONE-acetaminophen (PERCOCET) 10-325 MG tablet, Take 1 tablet by mouth 2 (two) times daily as needed for pain., Disp: 60 tablet, Rfl: 0 .  prochlorperazine (COMPAZINE) 10 MG tablet, Take 1 tablet (10 mg total) by mouth every 6 (six) hours as needed (Nausea or vomiting)., Disp: 60 tablet, Rfl: 4 .  pyridoxine (B-6) 100 MG tablet, Take by mouth., Disp: , Rfl:  .  rivaroxaban (XARELTO) 20 MG TABS tablet, Take 1 tablet (20 mg total) by mouth daily with supper. 90 day supply, Disp: 90 tablet, Rfl: 3 .  vancomycin (VANCOCIN) 50 mg/mL oral solution, Take by mouth., Disp: , Rfl:  Allergies:  Allergies  Allergen Reactions  . Hydrocodone-Acetaminophen Itching  . Budesonide-Formoterol Fumarate Other (See Comments)  . Other Itching    Flu-like symptoms from portabello mushrooms  . Codeine Itching  . Mushroom Extract Complex Nausea And Vomiting    Only a certain type of mushroom (name unknown) causes this reaction.    Past Medical History, Surgical history, Social history, and Family History were reviewed and updated.  Review of Systems: As above  Physical Exam:  height is 6' (1.829 m) and weight is 190 lb (86.183 kg). His oral temperature is 97.7 F (36.5 C). His blood pressure is 123/74 and his pulse is 75. His respiration is 16.   Wt Readings from Last 3  Encounters:  04/23/15 190 lb (86.183 kg)  03/09/15 197 lb (89.359 kg)  02/25/15 196 lb 11.2 oz (89.223 kg)     Well-developed and well-nourished white Male in no obvious distress. Head and neck exam shows no ocular or oral lesions. He now has alopecia There are no palpable cervical or supraclavicular lymph nodes. Lungs are clear. Cardiac exam regular rate and rhythm with no murmurs, rubs or bruits. Abdomen is soft. He has good bowel sounds. There is no fluid wave. There is no palpable liver or spleen tip. Back exam shows no tenderness over the spine, ribs or hips. He is wearing a back brace. Extremities shows no clubbing, cyanosis or edema. There might be some slight swelling of the right lower leg. No obvious venous cord is noted. He has a negative Homans sign with the right leg. He has good pulses in his distal extremities. Neurological exam is nonfocal. Skin exam shows no rashes, ecchymoses or petechia.   Impression and Plan: Mr. Lenn is 75 year old male. He has IgG kappa myeloma. He has had  5 cycles  of chemotherapy.  Overall, he really has done a great job. He has gone into the transplant with a very good response with the myeloma. I would not think that a follow-up bone marrow will be needed for another several months.  It is still too early to get him on any kind of maintenance therapy. I'm sure that maintenance Revlimid will be necessary.  For having the problem is that he did, I must say that he really looks quite good.  We will plan to get him back in one week just to make sure things doing okay. If all looks good 1 week, then we can probably spread his appointments out.  I spent about 35 minutes with he and his wife. It is wonderful to see him again.   [name], MD 3/10/20179:03 AM

## 2015-04-28 DIAGNOSIS — Z9484 Stem cells transplant status: Secondary | ICD-10-CM | POA: Diagnosis not present

## 2015-04-28 DIAGNOSIS — R938 Abnormal findings on diagnostic imaging of other specified body structures: Secondary | ICD-10-CM | POA: Diagnosis not present

## 2015-04-28 DIAGNOSIS — B9689 Other specified bacterial agents as the cause of diseases classified elsewhere: Secondary | ICD-10-CM | POA: Diagnosis not present

## 2015-04-28 DIAGNOSIS — Z8579 Personal history of other malignant neoplasms of lymphoid, hematopoietic and related tissues: Secondary | ICD-10-CM | POA: Diagnosis not present

## 2015-04-30 ENCOUNTER — Other Ambulatory Visit: Payer: Medicare Other

## 2015-04-30 ENCOUNTER — Ambulatory Visit: Payer: Medicare Other | Admitting: Hematology & Oncology

## 2015-05-01 ENCOUNTER — Other Ambulatory Visit: Payer: Medicare Other

## 2015-05-01 ENCOUNTER — Ambulatory Visit (HOSPITAL_BASED_OUTPATIENT_CLINIC_OR_DEPARTMENT_OTHER): Payer: Medicare Other | Admitting: Hematology & Oncology

## 2015-05-01 ENCOUNTER — Encounter: Payer: Self-pay | Admitting: Hematology & Oncology

## 2015-05-01 VITALS — BP 150/86 | HR 76 | Temp 97.4°F | Resp 16 | Ht 72.0 in | Wt 195.0 lb

## 2015-05-01 DIAGNOSIS — C9001 Multiple myeloma in remission: Secondary | ICD-10-CM

## 2015-05-01 DIAGNOSIS — C9000 Multiple myeloma not having achieved remission: Secondary | ICD-10-CM

## 2015-05-01 NOTE — Progress Notes (Signed)
Hematology and Oncology Follow Up Visit  [name] [name] [id number] 07-Oct-1940 75 y.o. 05/01/2015   Principle Diagnosis:   IgG kappa myeloma - Trisomy 11 by FISH  Acute thromboembolism of the right lower leg  Current Therapy:    Patient  s/p cycle #5 of Velcade/Revlimid/Decadron  S/P [facility]CT at [facility] on 04/02/2015  Zometa 4 mg IV every month       Xarelto 20 mg by mouth daily   History:  [name] [name] is back for follow-up.  He is feeling better and better. He went to see the infectious disease specialist at [facility] earlier this week. Everything looked okay from their point of view.  He is still having little bit of fatigue. He's had no nausea or vomiting. He's had no cough. He's had no rashes. He's had no diarrhea. He's had no leg swelling. He's had no joint problems.  He actually is part of a pilot program at [facility] for lab work being done here that just gets mailed to [employer]. I did this is a great idea.  His appetite is getting better.   Overall, is performance status is ECOG 1..    Medications:  Current outpatient prescriptions:  .  amitriptyline (ELAVIL) 25 MG tablet, Take 1 tablet (25 mg total) by mouth at bedtime., Disp: 30 tablet, Rfl: 3 .  atorvastatin (LIPITOR) 20 MG tablet, Take 1 tablet (20 mg total) by mouth daily., Disp: 90 tablet, Rfl: 3 .  carvedilol (COREG) 6.25 MG tablet, Take 1 tablet (6.25 mg total) by mouth 2 (two) times daily with a meal., Disp: 180 tablet, Rfl: 3 .  Cyanocobalamin (RA VITAMIN B-12 TR) 1000 MCG TBCR, Take by mouth., Disp: , Rfl:  .  dextromethorphan (DELSYM) 30 MG/5ML liquid, Take by mouth., Disp: , Rfl:  .  esomeprazole (NEXIUM) 40 MG capsule, Take 20 mg by mouth daily at 12 noon. , Disp: , Rfl:  .  famciclovir (FAMVIR) 500 MG tablet, Take 1 tablet (500 mg total) by mouth daily., Disp: 90 tablet, Rfl: 3 .  isosorbide mononitrate (IMDUR) 30 MG 24 hr tablet, Take 1 tablet (30 mg total) by mouth 2 (two) times daily. (Patient taking differently: Take  30 mg by mouth 2 (two) times daily. PATIENT TAKES 60 MG DAILY), Disp: 180 tablet, Rfl: 3 .  Loratadine 10 MG CAPS, Take by mouth., Disp: , Rfl:  .  LORazepam (ATIVAN) 1 MG tablet, TAKE 1 TABLET AT BEDTIME, Disp: 90 tablet, Rfl: 1 .  montelukast (SINGULAIR) 10 MG tablet, Take 1 tablet (10 mg total) by mouth at bedtime., Disp: 90 tablet, Rfl: 3 .  nitroGLYCERIN (NITROSTAT) 0.4 MG SL tablet, Place 0.4 mg under the tongue every 5 (five) minutes as needed. For chest pain, Disp: , Rfl:  .  oxyCODONE-acetaminophen (PERCOCET) 10-325 MG tablet, Take 1 tablet by mouth 2 (two) times daily as needed for pain., Disp: 60 tablet, Rfl: 0 .  prochlorperazine (COMPAZINE) 10 MG tablet, Take 1 tablet (10 mg total) by mouth every 6 (six) hours as needed (Nausea or vomiting)., Disp: 60 tablet, Rfl: 4 .  pyridoxine (B-6) 100 MG tablet, Take by mouth., Disp: , Rfl:  .  rivaroxaban (XARELTO) 20 MG TABS tablet, Take 1 tablet (20 mg total) by mouth daily with supper. 90 day supply, Disp: 90 tablet, Rfl: 3  Allergies:  Allergies  Allergen Reactions  . Hydrocodone-Acetaminophen Itching  . Budesonide-Formoterol Fumarate Other (See Comments)  . Other Itching    Flu-like symptoms from portabello mushrooms  . Codeine Itching  .  Mushroom Extract Complex Nausea And Vomiting    Only a certain type of mushroom (name unknown) causes this reaction.    Past Medical History, Surgical history, Social history, and Family History were reviewed and updated.  Review of Systems: As above  Physical Exam:  height is 6' (1.829 m) and weight is 195 lb (88.451 kg). His oral temperature is 97.4 F (36.3 C). His blood pressure is 150/86 and his pulse is 76. His respiration is 16.   Wt Readings from Last 3 Encounters:  05/01/15 195 lb (88.451 kg)  04/23/15 190 lb (86.183 kg)  03/09/15 197 lb (89.359 kg)     Well-developed and well-nourished white Male in no obvious distress. Head and neck exam shows no ocular or oral lesions. He now  has alopecia There are no palpable cervical or supraclavicular lymph nodes. Lungs are clear. Cardiac exam regular rate and rhythm with no murmurs, rubs or bruits. Abdomen is soft. He has good bowel sounds. There is no fluid wave. There is no palpable liver or spleen tip. Back exam shows no tenderness over the spine, ribs or hips. He is wearing a back brace. Extremities shows no clubbing, cyanosis or edema. There might be some slight swelling of the right lower leg. No obvious venous cord is noted. He has a negative Homans sign with the right leg. He has good pulses in his distal extremities. Neurological exam is nonfocal. Skin exam shows no rashes, ecchymoses or petechia.   Impression and Plan: [name] [name] is 75 year old male. He has IgG kappa myeloma. He has had  5 cycles  of chemotherapy.  Overall, he really has done a great job. He has gone into the transplant with a very good response with the myeloma. I would not think that a follow-up bone marrow will be needed for another several months.  We will go him plan to get him back now in about 3 weeks area and I then this would be reasonable. Hopefully, if things continue to do well, then we can have him come back every 2 or 3 months.  At some point, he will need to be placed back onto maintenance therapy area and I'm sure [facility] will decide what would be appropriate for him.    [name], MD 3/17/20173:28 PM

## 2015-05-04 DIAGNOSIS — G4733 Obstructive sleep apnea (adult) (pediatric): Secondary | ICD-10-CM | POA: Diagnosis not present

## 2015-05-05 ENCOUNTER — Ambulatory Visit (INDEPENDENT_AMBULATORY_CARE_PROVIDER_SITE_OTHER): Payer: Medicare Other

## 2015-05-05 ENCOUNTER — Ambulatory Visit (INDEPENDENT_AMBULATORY_CARE_PROVIDER_SITE_OTHER): Payer: Medicare Other | Admitting: Sports Medicine

## 2015-05-05 ENCOUNTER — Encounter: Payer: Self-pay | Admitting: Sports Medicine

## 2015-05-05 VITALS — BP 143/94 | HR 80 | Resp 18 | Wt 192.1 lb

## 2015-05-05 DIAGNOSIS — M2342 Loose body in knee, left knee: Secondary | ICD-10-CM

## 2015-05-05 DIAGNOSIS — M170 Bilateral primary osteoarthritis of knee: Secondary | ICD-10-CM

## 2015-05-05 DIAGNOSIS — M2341 Loose body in knee, right knee: Secondary | ICD-10-CM

## 2015-05-05 DIAGNOSIS — I2699 Other pulmonary embolism without acute cor pulmonale: Secondary | ICD-10-CM | POA: Diagnosis not present

## 2015-05-05 DIAGNOSIS — M25561 Pain in right knee: Secondary | ICD-10-CM | POA: Diagnosis not present

## 2015-05-05 DIAGNOSIS — R918 Other nonspecific abnormal finding of lung field: Secondary | ICD-10-CM | POA: Diagnosis not present

## 2015-05-05 DIAGNOSIS — M25562 Pain in left knee: Secondary | ICD-10-CM

## 2015-05-05 DIAGNOSIS — M25462 Effusion, left knee: Secondary | ICD-10-CM

## 2015-05-05 DIAGNOSIS — M25461 Effusion, right knee: Secondary | ICD-10-CM

## 2015-05-05 DIAGNOSIS — G6289 Other specified polyneuropathies: Secondary | ICD-10-CM

## 2015-05-05 DIAGNOSIS — I10 Essential (primary) hypertension: Secondary | ICD-10-CM | POA: Diagnosis not present

## 2015-05-05 DIAGNOSIS — G4733 Obstructive sleep apnea (adult) (pediatric): Secondary | ICD-10-CM | POA: Diagnosis not present

## 2015-05-05 DIAGNOSIS — S8991XA Unspecified injury of right lower leg, initial encounter: Secondary | ICD-10-CM | POA: Diagnosis not present

## 2015-05-05 DIAGNOSIS — S8992XA Unspecified injury of left lower leg, initial encounter: Secondary | ICD-10-CM | POA: Diagnosis not present

## 2015-05-05 DIAGNOSIS — C9000 Multiple myeloma not having achieved remission: Secondary | ICD-10-CM | POA: Diagnosis not present

## 2015-05-05 MED ORDER — AMLODIPINE BESYLATE 10 MG PO TABS: 10.0000 mg | ORAL_TABLET | Freq: Every day | ORAL | Status: DC

## 2015-05-05 MED ORDER — HYDROCHLOROTHIAZIDE 25 MG PO TABS: 25.0000 mg | ORAL_TABLET | Freq: Every day | ORAL | Status: DC

## 2015-05-05 NOTE — Assessment & Plan Note (Signed)
After inadequate responses to gabapentin and Lyrica, we ultimately  Several on amitriptyline which was effective. He did have some mild QT prolongation with his cardiologist, on review of the records it was never greater than 500 ms/think it will be safe to bump his amitriptyline back up and continue to follow his QT interval closely.

## 2015-05-05 NOTE — Progress Notes (Signed)
  Subjective:    CC:  Follow-up  HPI: Hypertension: Improved with amlodipine 5 and hydrochlorothiazide 12.5. No headaches, visual changes, chest pain.  Peripheral neuropathy: Secondary to multiple myeloma, good response to Amitriptyline however QTC was close to 500 and so dose was decreased by cardiology, his symptoms are now intolerable. He has failed gabapentin and Lyrica.  Bilateral knee pain: Localized under the kneecap, moderate, persistent without radiation and worse with going up and down stairs as well as squatting, analgesics are ineffective, desires interventional treatment today.  Past medical history, Surgical history, Family history not pertinant except as noted below, Social history, Allergies, and medications have been entered into the medical record, reviewed, and no changes needed.   Review of Systems: No fevers, chills, night sweats, weight loss, chest pain, or shortness of breath.   Objective:    General: Well Developed, well nourished, and in no acute distress.  Neuro: Alert and oriented x3, extra-ocular muscles intact, sensation grossly intact.  HEENT: Normocephalic, atraumatic, pupils equal round reactive to light, neck supple, no masses, no lymphadenopathy, thyroid nonpalpable.  Skin: Warm and dry, no rashes. Cardiac: Regular rate and rhythm, no murmurs rubs or gallops, no lower extremity edema.  Respiratory: Clear to auscultation bilaterally. Not using accessory muscles, speaking in full sentences. Bilateral Knee: Normal to inspection with no erythema or effusion or obvious bony abnormalities. Tender to palpation under the lateral patellar facet ROM normal in flexion and extension and lower leg rotation. Ligaments with solid consistent endpoints including ACL, PCL, LCL, MCL. Negative Mcmurray's and provocative meniscal tests. Non painful patellar compression. Patellar and quadriceps tendons unremarkable. Hamstring and quadriceps strength is normal.  Procedure:  Real-time Ultrasound Guided Injection of  Left knee Device: GE Logiq E  Verbal informed consent obtained.  Time-out conducted.  Noted no overlying erythema, induration, or other signs of local infection.  Skin prepped in a sterile fashion.  Local anesthesia: Topical Ethyl chloride.  With sterile technique and under real time ultrasound guidance:   1 mL kenalog 40, 2 mL lidocaine, 2 mL Marcaine injected easily into the suprapatellar recess. Completed without difficulty  Pain immediately resolved suggesting accurate placement of the medication.  Advised to call if fevers/chills, erythema, induration, drainage, or persistent bleeding.  Images permanently stored and available for review in the ultrasound unit.  Impression: Technically successful ultrasound guided injection.  Procedure: Real-time Ultrasound Guided Injection of  Right knee Device: GE Logiq E  Verbal informed consent obtained.  Time-out conducted.  Noted no overlying erythema, induration, or other signs of local infection.  Skin prepped in a sterile fashion.  Local anesthesia: Topical Ethyl chloride.  With sterile technique and under real time ultrasound guidance:   1 mL kenalog 40, 2 mL lidocaine, 2 mL Marcaine injected easily into the suprapatellar recess. Completed without difficulty  Pain immediately resolved suggesting accurate placement of the medication.  Advised to call if fevers/chills, erythema, induration, drainage, or persistent bleeding.  Images permanently stored and available for review in the ultrasound unit.  Impression: Technically successful ultrasound guided injection.  Impression and Recommendations:

## 2015-05-05 NOTE — Assessment & Plan Note (Signed)
Increasing amlodipine to 10 mg and HCTZ to 25 mg

## 2015-05-05 NOTE — Assessment & Plan Note (Signed)
Bilateral x-rays, bilateral injection, return in one month.

## 2015-05-07 ENCOUNTER — Other Ambulatory Visit: Payer: Medicare Other

## 2015-05-08 ENCOUNTER — Encounter: Payer: Self-pay | Admitting: Sports Medicine

## 2015-05-11 DIAGNOSIS — R768 Other specified abnormal immunological findings in serum: Secondary | ICD-10-CM | POA: Insufficient documentation

## 2015-05-11 DIAGNOSIS — H2513 Age-related nuclear cataract, bilateral: Secondary | ICD-10-CM | POA: Diagnosis not present

## 2015-05-14 ENCOUNTER — Other Ambulatory Visit (HOSPITAL_BASED_OUTPATIENT_CLINIC_OR_DEPARTMENT_OTHER): Payer: Medicare Other

## 2015-05-14 DIAGNOSIS — C9000 Multiple myeloma not having achieved remission: Secondary | ICD-10-CM | POA: Diagnosis not present

## 2015-05-14 DIAGNOSIS — C9001 Multiple myeloma in remission: Secondary | ICD-10-CM

## 2015-05-14 LAB — CBC WITH DIFFERENTIAL (CANCER CENTER ONLY)
BASO#: 0.1 10*3/uL (ref 0.0–0.2)
BASO%: 0.5 % (ref 0.0–2.0)
EOS%: 2.6 % (ref 0.0–7.0)
Eosinophils Absolute: 0.3 10*3/uL (ref 0.0–0.5)
HEMATOCRIT: 35.9 % — AB (ref 38.7–49.9)
HGB: 12.1 g/dL — ABNORMAL LOW (ref 13.0–17.1)
LYMPH#: 2.2 10*3/uL (ref 0.9–3.3)
LYMPH%: 22.4 % (ref 14.0–48.0)
MCH: 29.6 pg (ref 28.0–33.4)
MCHC: 33.7 g/dL (ref 32.0–35.9)
MCV: 88 fL (ref 82–98)
MONO#: 1.7 10*3/uL — AB (ref 0.1–0.9)
MONO%: 17.1 % — ABNORMAL HIGH (ref 0.0–13.0)
NEUT#: 5.6 10*3/uL (ref 1.5–6.5)
NEUT%: 57.4 % (ref 40.0–80.0)
Platelets: 201 10*3/uL (ref 145–400)
RBC: 4.09 10*6/uL — ABNORMAL LOW (ref 4.20–5.70)
RDW: 15.8 % — AB (ref 11.1–15.7)
WBC: 9.8 10*3/uL (ref 4.0–10.0)

## 2015-05-14 LAB — COMPREHENSIVE METABOLIC PANEL
ALT: 48 U/L (ref 0–55)
AST: 29 U/L (ref 5–34)
Albumin: 3.2 g/dL — ABNORMAL LOW (ref 3.5–5.0)
Alkaline Phosphatase: 134 U/L (ref 40–150)
Anion Gap: 8 mEq/L (ref 3–11)
BUN: 15.8 mg/dL (ref 7.0–26.0)
CHLORIDE: 101 meq/L (ref 98–109)
CO2: 30 mEq/L — ABNORMAL HIGH (ref 22–29)
Calcium: 9.5 mg/dL (ref 8.4–10.4)
Creatinine: 1 mg/dL (ref 0.7–1.3)
EGFR: 74 mL/min/{1.73_m2} — ABNORMAL LOW (ref >90)
GLUCOSE: 93 mg/dL (ref 70–140)
POTASSIUM: 3.5 meq/L (ref 3.5–5.1)
SODIUM: 139 meq/L (ref 136–145)
Total Bilirubin: 0.55 mg/dL (ref 0.20–1.20)
Total Protein: 7.3 g/dL (ref 6.4–8.3)

## 2015-05-14 LAB — MAGNESIUM: Magnesium: 2 mg/dl (ref 1.5–2.5)

## 2015-05-21 ENCOUNTER — Other Ambulatory Visit: Payer: Medicare Other

## 2015-05-22 DIAGNOSIS — R918 Other nonspecific abnormal finding of lung field: Secondary | ICD-10-CM | POA: Diagnosis not present

## 2015-05-22 DIAGNOSIS — Z955 Presence of coronary angioplasty implant and graft: Secondary | ICD-10-CM | POA: Diagnosis not present

## 2015-05-22 DIAGNOSIS — R9389 Abnormal findings on diagnostic imaging of other specified body structures: Secondary | ICD-10-CM | POA: Insufficient documentation

## 2015-05-22 DIAGNOSIS — C9000 Multiple myeloma not having achieved remission: Secondary | ICD-10-CM | POA: Diagnosis not present

## 2015-05-22 DIAGNOSIS — Z9484 Stem cells transplant status: Secondary | ICD-10-CM | POA: Diagnosis not present

## 2015-05-22 DIAGNOSIS — R938 Abnormal findings on diagnostic imaging of other specified body structures: Secondary | ICD-10-CM | POA: Diagnosis not present

## 2015-05-22 DIAGNOSIS — C9001 Multiple myeloma in remission: Secondary | ICD-10-CM | POA: Insufficient documentation

## 2015-05-22 DIAGNOSIS — G4733 Obstructive sleep apnea (adult) (pediatric): Secondary | ICD-10-CM | POA: Diagnosis not present

## 2015-05-22 DIAGNOSIS — J189 Pneumonia, unspecified organism: Secondary | ICD-10-CM | POA: Diagnosis not present

## 2015-05-22 DIAGNOSIS — I2510 Atherosclerotic heart disease of native coronary artery without angina pectoris: Secondary | ICD-10-CM | POA: Diagnosis not present

## 2015-05-22 DIAGNOSIS — I10 Essential (primary) hypertension: Secondary | ICD-10-CM | POA: Diagnosis not present

## 2015-05-28 ENCOUNTER — Other Ambulatory Visit: Payer: Medicare Other

## 2015-06-02 ENCOUNTER — Ambulatory Visit (INDEPENDENT_AMBULATORY_CARE_PROVIDER_SITE_OTHER): Payer: Medicare Other | Admitting: Sports Medicine

## 2015-06-02 ENCOUNTER — Encounter: Payer: Self-pay | Admitting: Sports Medicine

## 2015-06-02 VITALS — BP 122/75 | HR 67 | Resp 18 | Wt 166.7 lb

## 2015-06-02 DIAGNOSIS — M170 Bilateral primary osteoarthritis of knee: Secondary | ICD-10-CM

## 2015-06-02 DIAGNOSIS — I10 Essential (primary) hypertension: Secondary | ICD-10-CM | POA: Diagnosis not present

## 2015-06-02 DIAGNOSIS — G6289 Other specified polyneuropathies: Secondary | ICD-10-CM | POA: Diagnosis not present

## 2015-06-02 MED ORDER — POTASSIUM CHLORIDE CRYS ER 20 MEQ PO TBCR: 20.0000 meq | EXTENDED_RELEASE_TABLET | Freq: Every day | ORAL | Status: DC

## 2015-06-02 MED ORDER — GABAPENTIN 300 MG PO CAPS: ORAL_CAPSULE | ORAL | Status: DC

## 2015-06-02 NOTE — Assessment & Plan Note (Signed)
Continue gentle up titration of gabapentin, he will start with 600 mg at bedtime, if insufficient response during the day we will go up to an additional 300 mg midday.

## 2015-06-02 NOTE — Assessment & Plan Note (Signed)
Doing well after injections.

## 2015-06-02 NOTE — Patient Instructions (Signed)
[facility] [address], [place], [place] [address] Hours: Open today  8AM-5PM Phone: [phone]

## 2015-06-02 NOTE — Progress Notes (Signed)
  Subjective:    CC: Follow-up  HPI: Hypertension: Well controlled  Peripheral neuropathy: Doing better with gabapentin, would like to increase the dose.  Knee pain: Doing well after injections  Past medical history, Surgical history, Family history not pertinant except as noted below, Social history, Allergies, and medications have been entered into the medical record, reviewed, and no changes needed.   Review of Systems: No fevers, chills, night sweats, weight loss, chest pain, or shortness of breath.   Objective:    General: Well Developed, well nourished, and in no acute distress.  Neuro: Alert and oriented x3, extra-ocular muscles intact, sensation grossly intact.  HEENT: Normocephalic, atraumatic, pupils equal round reactive to light, neck supple, no masses, no lymphadenopathy, thyroid nonpalpable.  Skin: Warm and dry, no rashes. Cardiac: Regular rate and rhythm, no murmurs rubs or gallops, no lower extremity edema.  Respiratory: Clear to auscultation bilaterally. Not using accessory muscles, speaking in full sentences.  Impression and Recommendations:   I spent 25 minutes with this patient, greater than 50% was face-to-face time counseling regarding the above diagnoses

## 2015-06-02 NOTE — Assessment & Plan Note (Signed)
Well-controlled now on 20 mg amlodipine and 25 mg of hydrochlorothiazide.  Rechecking BMP.

## 2015-06-05 ENCOUNTER — Other Ambulatory Visit (HOSPITAL_BASED_OUTPATIENT_CLINIC_OR_DEPARTMENT_OTHER): Payer: Medicare Other

## 2015-06-05 ENCOUNTER — Ambulatory Visit (HOSPITAL_BASED_OUTPATIENT_CLINIC_OR_DEPARTMENT_OTHER): Payer: Medicare Other | Admitting: Hematology & Oncology

## 2015-06-05 ENCOUNTER — Telehealth: Payer: Self-pay | Admitting: *Deleted

## 2015-06-05 VITALS — BP 134/71 | HR 67 | Temp 97.8°F | Wt 199.0 lb

## 2015-06-05 DIAGNOSIS — C9001 Multiple myeloma in remission: Secondary | ICD-10-CM | POA: Diagnosis not present

## 2015-06-05 DIAGNOSIS — C9000 Multiple myeloma not having achieved remission: Secondary | ICD-10-CM

## 2015-06-05 DIAGNOSIS — I2510 Atherosclerotic heart disease of native coronary artery without angina pectoris: Secondary | ICD-10-CM

## 2015-06-05 DIAGNOSIS — Z7901 Long term (current) use of anticoagulants: Secondary | ICD-10-CM

## 2015-06-05 DIAGNOSIS — I82441 Acute embolism and thrombosis of right tibial vein: Secondary | ICD-10-CM

## 2015-06-05 DIAGNOSIS — I82401 Acute embolism and thrombosis of unspecified deep veins of right lower extremity: Secondary | ICD-10-CM | POA: Diagnosis not present

## 2015-06-05 LAB — COMPREHENSIVE METABOLIC PANEL
ALBUMIN: 3.4 g/dL — AB (ref 3.5–5.0)
ALK PHOS: 94 U/L (ref 40–150)
ALT: 18 U/L (ref 0–55)
AST: 22 U/L (ref 5–34)
Anion Gap: 10 mEq/L (ref 3–11)
BUN: 13.1 mg/dL (ref 7.0–26.0)
CALCIUM: 9.4 mg/dL (ref 8.4–10.4)
CO2: 27 mEq/L (ref 22–29)
Chloride: 104 mEq/L (ref 98–109)
Creatinine: 1 mg/dL (ref 0.7–1.3)
EGFR: 72 mL/min/{1.73_m2} — AB (ref >90)
Glucose: 98 mg/dl (ref 70–140)
POTASSIUM: 2.9 meq/L — AB (ref 3.5–5.1)
Sodium: 141 mEq/L (ref 136–145)
Total Bilirubin: 0.59 mg/dL (ref 0.20–1.20)
Total Protein: 7.2 g/dL (ref 6.4–8.3)

## 2015-06-05 LAB — CBC WITH DIFFERENTIAL (CANCER CENTER ONLY)
BASO#: 0 10*3/uL (ref 0.0–0.2)
BASO%: 0.3 % (ref 0.0–2.0)
EOS%: 2.2 % (ref 0.0–7.0)
Eosinophils Absolute: 0.2 10*3/uL (ref 0.0–0.5)
HCT: 37.4 % — ABNORMAL LOW (ref 38.7–49.9)
HGB: 12.7 g/dL — ABNORMAL LOW (ref 13.0–17.1)
LYMPH#: 2.2 10*3/uL (ref 0.9–3.3)
LYMPH%: 30.2 % (ref 14.0–48.0)
MCH: 29.8 pg (ref 28.0–33.4)
MCHC: 34 g/dL (ref 32.0–35.9)
MCV: 88 fL (ref 82–98)
MONO#: 0.9 10*3/uL (ref 0.1–0.9)
MONO%: 12 % (ref 0.0–13.0)
NEUT#: 4.1 10*3/uL (ref 1.5–6.5)
NEUT%: 55.3 % (ref 40.0–80.0)
PLATELETS: 242 10*3/uL (ref 145–400)
RBC: 4.26 10*6/uL (ref 4.20–5.70)
RDW: 15.3 % (ref 11.1–15.7)
WBC: 7.3 10*3/uL (ref 4.0–10.0)

## 2015-06-05 LAB — LACTATE DEHYDROGENASE: LDH: 174 U/L (ref 125–245)

## 2015-06-05 MED ORDER — RIVAROXABAN 10 MG PO TABS: 10.0000 mg | ORAL_TABLET | Freq: Every day | ORAL | Status: DC

## 2015-06-05 MED ORDER — RIVAROXABAN 10 MG PO TABS: 20.0000 mg | ORAL_TABLET | Freq: Every day | ORAL | Status: DC

## 2015-06-05 NOTE — Addendum Note (Signed)
Addended by: Burney Gauze R on: 06/05/2015 12:06 PM   Modules accepted: Orders

## 2015-06-05 NOTE — Telephone Encounter (Signed)
Critical Value Potassium 2.9 Dr Marin Olp notified. He wants to know how much potassium patient is taking. Spoke to patient and he is prescribed 57meq daily, however he ran out about a week ago and hasn't been taking it. Spoke to Dr Marin Olp and he wants to patient to restart medication today. Patient is aware and he states he's already called in the refill and doesn't need a new prescription sent.

## 2015-06-05 NOTE — Progress Notes (Signed)
Hematology and Oncology Follow Up Visit  [name] [name] [id number] 10/07/1940 75 y.o. 06/05/2015   Principle Diagnosis:   IgG kappa myeloma - Trisomy 11 by FISH  Acute thromboembolism of the right lower leg  Current Therapy:    Patient  s/p cycle #5 of Velcade/Revlimid/Decadron  S/P ASCT at [facility] on 04/02/2015  Zometa 4 mg IV every month       Xarelto 10 mg by mouth daily   History:  [name] [name] is back for follow-up.  He is feeling better and better. He was seen [facility] about 2 weeks ago. They were quite pleased with his progress. When his see him back, they will discuss maintenance therapy.  He has some lab studies done at [facility]. He is worried that when the studies came back that was indeterminate. This was one of the immunofixation studies. I told him that this was not unusual.  He is still on the Xarelto 20 mg a day. I think we might want to consider getting him down to 10 mg a day.   He's had no fever. He's had no bleeding. He's had no change in bowel or bladder habits. He did have C. difficile after the transplant. This has really improved. He has seen the infectious disease specialists at [facility].   I told him that he can premenstrually wants now. I think his immune system is working quite well.   Overall, is performance status is ECOG 1.   Medications:  Current outpatient prescriptions:  .  amLODipine (NORVASC) 10 MG tablet, Take 2 tablets (20 mg total) by mouth daily., Disp: , Rfl:  .  carvedilol (COREG) 6.25 MG tablet, Take 1 tablet (6.25 mg total) by mouth 2 (two) times daily with a meal., Disp: 180 tablet, Rfl: 3 .  Cyanocobalamin (RA VITAMIN B-12 TR) 1000 MCG TBCR, Take by mouth., Disp: , Rfl:  .  esomeprazole (NEXIUM) 40 MG capsule, Take 20 mg by mouth daily at 12 noon. , Disp: , Rfl:  .  famciclovir (FAMVIR) 500 MG tablet, Take 1 tablet (500 mg total) by mouth daily., Disp: 90 tablet, Rfl: 3 .  gabapentin (NEURONTIN) 300 MG capsule, 600 mg at bedtime, may take an  additional 300 mg during the day if needed, Disp: 90 capsule, Rfl: 11 .  hydrochlorothiazide (HYDRODIURIL) 25 MG tablet, Take 1 tablet (25 mg total) by mouth daily., Disp: 90 tablet, Rfl: 3 .  isosorbide mononitrate (IMDUR) 30 MG 24 hr tablet, Take 1 tablet (30 mg total) by mouth 2 (two) times daily. (Patient taking differently: Take 30 mg by mouth 2 (two) times daily. PATIENT TAKES 60 MG DAILY), Disp: 180 tablet, Rfl: 3 .  Loratadine 10 MG CAPS, Take by mouth., Disp: , Rfl:  .  LORazepam (ATIVAN) 1 MG tablet, TAKE 1 TABLET AT BEDTIME, Disp: 90 tablet, Rfl: 1 .  montelukast (SINGULAIR) 10 MG tablet, Take 1 tablet (10 mg total) by mouth at bedtime., Disp: 90 tablet, Rfl: 3 .  nitroGLYCERIN (NITROSTAT) 0.4 MG SL tablet, Place 0.4 mg under the tongue every 5 (five) minutes as needed. For chest pain, Disp: , Rfl:  .  oxyCODONE-acetaminophen (PERCOCET) 10-325 MG tablet, Take 1 tablet by mouth 2 (two) times daily as needed for pain., Disp: 60 tablet, Rfl: 0 .  potassium chloride SA (K-DUR,KLOR-CON) 20 MEQ tablet, Take 1 tablet (20 mEq total) by mouth daily., Disp: 30 tablet, Rfl: 3 .  pyridoxine (B-6) 100 MG tablet, Take by mouth., Disp: , Rfl:  .  rivaroxaban (XARELTO)  20 MG TABS tablet, Take 1 tablet (20 mg total) by mouth daily with supper. 90 day supply, Disp: 90 tablet, Rfl: 3  Allergies:  Allergies  Allergen Reactions  . Hydrocodone-Acetaminophen Itching  . Budesonide-Formoterol Fumarate Other (See Comments)  . Other Itching    Flu-like symptoms from portabello mushrooms  . Codeine Itching  . Mushroom Extract Complex Nausea And Vomiting    Only a certain type of mushroom (name unknown) causes this reaction.    Past Medical History, Surgical history, Social history, and Family History were reviewed and updated.  Review of Systems: As above  Physical Exam:  weight is 199 lb (90.266 kg). His oral temperature is 97.8 F (36.6 C). His blood pressure is 134/71 and his pulse is 67.   Wt  Readings from Last 3 Encounters:  06/05/15 199 lb (90.266 kg)  06/02/15 166 lb 11.2 oz (75.615 kg)  05/05/15 192 lb 1.6 oz (87.136 kg)     Well-developed and well-nourished white Male in no obvious distress. Head and neck exam shows no ocular or oral lesions. He now has alopecia There are no palpable cervical or supraclavicular lymph nodes. Lungs are clear. Cardiac exam regular rate and rhythm with no murmurs, rubs or bruits. Abdomen is soft. He has good bowel sounds. There is no fluid wave. There is no palpable liver or spleen tip. Back exam shows no tenderness over the spine, ribs or hips. He is wearing a back brace. Extremities shows no clubbing, cyanosis or edema. There might be some slight swelling of the right lower leg. No obvious venous cord is noted. He has a negative Homans sign with the right leg. He has good pulses in his distal extremities. Neurological exam is nonfocal. Skin exam shows no rashes, ecchymoses or petechia.   Impression and Plan: [name] [name] is 75 year old male. He has IgG kappa myeloma. He has had  5 cycles  of chemotherapy.  Overall, he really has done a great job. He has gone into the transplant with a very good response with the myeloma. I would not think that a follow-up bone marrow will be needed for another several months.  We will go him plan to get him back now in about 4 weeks.  I then this would be reasonable. Hopefully, if things continue to do well, then we can have him come back every 2 or 3 months.  At some point, he will need to be placed back onto maintenance therapy . I'm sure [facility] will decide what would be appropriate for him.    [name], MD 4/21/201711:53 AM

## 2015-06-06 LAB — IGG, IGA, IGM
IgA, Qn, Serum: 106 mg/dL (ref 61–437)
IgG, Qn, Serum: 882 mg/dL (ref 700–1600)
IgM, Qn, Serum: 20 mg/dL (ref 15–143)

## 2015-06-08 DIAGNOSIS — M25561 Pain in right knee: Secondary | ICD-10-CM | POA: Diagnosis not present

## 2015-06-08 DIAGNOSIS — R262 Difficulty in walking, not elsewhere classified: Secondary | ICD-10-CM | POA: Diagnosis not present

## 2015-06-08 DIAGNOSIS — M25562 Pain in left knee: Secondary | ICD-10-CM | POA: Diagnosis not present

## 2015-06-08 DIAGNOSIS — M6281 Muscle weakness (generalized): Secondary | ICD-10-CM | POA: Diagnosis not present

## 2015-06-08 LAB — KAPPA/LAMBDA LIGHT CHAINS
Ig Kappa Free Light Chain: 16.34 mg/L (ref 3.30–19.40)
Ig Lambda Free Light Chain: 9.81 mg/L (ref 5.71–26.30)
Kappa/Lambda FluidC Ratio: 1.67 — ABNORMAL HIGH (ref 0.26–1.65)

## 2015-06-08 LAB — PROTEIN ELECTROPHORESIS, SERUM, WITH REFLEX
A/G Ratio: 1.1 (ref 0.7–1.7)
ALBUMIN: 3.6 g/dL (ref 2.9–4.4)
ALPHA 1: 0.2 g/dL (ref 0.0–0.4)
ALPHA 2: 1 g/dL (ref 0.4–1.0)
BETA: 1.1 g/dL (ref 0.7–1.3)
GAMMA GLOBULIN: 0.9 g/dL (ref 0.4–1.8)
Globulin, Total: 3.2 g/dL (ref 2.2–3.9)
Total Protein: 6.8 g/dL (ref 6.0–8.5)

## 2015-06-10 DIAGNOSIS — M25562 Pain in left knee: Secondary | ICD-10-CM | POA: Diagnosis not present

## 2015-06-10 DIAGNOSIS — R262 Difficulty in walking, not elsewhere classified: Secondary | ICD-10-CM | POA: Diagnosis not present

## 2015-06-10 DIAGNOSIS — M6281 Muscle weakness (generalized): Secondary | ICD-10-CM | POA: Diagnosis not present

## 2015-06-10 DIAGNOSIS — M25561 Pain in right knee: Secondary | ICD-10-CM | POA: Diagnosis not present

## 2015-06-11 DIAGNOSIS — M25562 Pain in left knee: Secondary | ICD-10-CM | POA: Diagnosis not present

## 2015-06-11 DIAGNOSIS — R262 Difficulty in walking, not elsewhere classified: Secondary | ICD-10-CM | POA: Diagnosis not present

## 2015-06-11 DIAGNOSIS — M6281 Muscle weakness (generalized): Secondary | ICD-10-CM | POA: Diagnosis not present

## 2015-06-11 DIAGNOSIS — M25561 Pain in right knee: Secondary | ICD-10-CM | POA: Diagnosis not present

## 2015-06-14 ENCOUNTER — Encounter: Payer: Self-pay | Admitting: Hematology & Oncology

## 2015-06-14 ENCOUNTER — Other Ambulatory Visit: Payer: Self-pay | Admitting: Sports Medicine

## 2015-06-14 DIAGNOSIS — I10 Essential (primary) hypertension: Secondary | ICD-10-CM

## 2015-06-15 ENCOUNTER — Encounter: Payer: Self-pay | Admitting: Sports Medicine

## 2015-06-15 ENCOUNTER — Ambulatory Visit (INDEPENDENT_AMBULATORY_CARE_PROVIDER_SITE_OTHER): Payer: Medicare Other

## 2015-06-15 ENCOUNTER — Other Ambulatory Visit: Payer: Self-pay | Admitting: *Deleted

## 2015-06-15 ENCOUNTER — Ambulatory Visit (INDEPENDENT_AMBULATORY_CARE_PROVIDER_SITE_OTHER): Payer: Medicare Other | Admitting: Sports Medicine

## 2015-06-15 DIAGNOSIS — I2510 Atherosclerotic heart disease of native coronary artery without angina pectoris: Secondary | ICD-10-CM

## 2015-06-15 DIAGNOSIS — M479 Spondylosis, unspecified: Secondary | ICD-10-CM

## 2015-06-15 DIAGNOSIS — M5030 Other cervical disc degeneration, unspecified cervical region: Secondary | ICD-10-CM

## 2015-06-15 DIAGNOSIS — M542 Cervicalgia: Secondary | ICD-10-CM | POA: Diagnosis not present

## 2015-06-15 MED ORDER — AMLODIPINE BESYLATE 10 MG PO TABS: 20.0000 mg | ORAL_TABLET | Freq: Every day | ORAL | Status: DC

## 2015-06-15 MED ORDER — POTASSIUM CHLORIDE CRYS ER 20 MEQ PO TBCR: 20.0000 meq | EXTENDED_RELEASE_TABLET | Freq: Two times a day (BID) | ORAL | Status: DC

## 2015-06-15 MED ORDER — PREDNISONE 50 MG PO TABS: ORAL_TABLET | ORAL | Status: DC

## 2015-06-15 MED ORDER — GABAPENTIN 600 MG PO TABS: ORAL_TABLET | ORAL | Status: DC

## 2015-06-15 NOTE — Assessment & Plan Note (Signed)
Right upper cervical radiculopathy, history of multiple myeloma and history of cervical fusion. Prednisone, increasing gabapentin to 600 mg in the morning and 1200 and night, x-rays, return to see me if no better in 2 weeks to proceed with MRI with and without IV contrast for interventional planning. He did have cervical epidural's in 2014, does not remember whether they helped or not.

## 2015-06-15 NOTE — Progress Notes (Signed)
  Subjective:    CC: Neck pain  HPI: Cervical degenerative disc disease: With right-sided neck pain radiating into his jaw, into the upper shoulder, history of cervical fusion in the past, he has had a few epidurals 3 years ago but can't remember if they helped or not. Symptoms are severe, persistent, no trauma. History of multiple myeloma.  Past medical history, Surgical history, Family history not pertinant except as noted below, Social history, Allergies, and medications have been entered into the medical record, reviewed, and no changes needed.   Review of Systems: No fevers, chills, night sweats, weight loss, chest pain, or shortness of breath.   Objective:    General: Well Developed, well nourished, and in no acute distress.  Neuro: Alert and oriented x3, extra-ocular muscles intact, sensation grossly intact.  HEENT: Normocephalic, atraumatic, pupils equal round reactive to light, neck supple, no masses, no lymphadenopathy, thyroid nonpalpable.  Skin: Warm and dry, no rashes. Cardiac: Regular rate and rhythm, no murmurs rubs or gallops, no lower extremity edema.  Respiratory: Clear to auscultation bilaterally. Not using accessory muscles, speaking in full sentences. Neck: Negative spurling's Full neck range of motion Grip strength and sensation normal in bilateral hands Strength good C4 to T1 distribution No sensory change to C4 to T1 Reflexes normal  Impression and Recommendations:   I spent 25 minutes with this patient, greater than 50% was face-to-face time counseling regarding the above diagnoses

## 2015-06-17 ENCOUNTER — Ambulatory Visit: Payer: Self-pay | Admitting: Sports Medicine

## 2015-06-17 DIAGNOSIS — M25562 Pain in left knee: Secondary | ICD-10-CM | POA: Diagnosis not present

## 2015-06-17 DIAGNOSIS — M6281 Muscle weakness (generalized): Secondary | ICD-10-CM | POA: Diagnosis not present

## 2015-06-17 DIAGNOSIS — R262 Difficulty in walking, not elsewhere classified: Secondary | ICD-10-CM | POA: Diagnosis not present

## 2015-06-17 DIAGNOSIS — M25561 Pain in right knee: Secondary | ICD-10-CM | POA: Diagnosis not present

## 2015-06-19 DIAGNOSIS — M25562 Pain in left knee: Secondary | ICD-10-CM | POA: Diagnosis not present

## 2015-06-19 DIAGNOSIS — R262 Difficulty in walking, not elsewhere classified: Secondary | ICD-10-CM | POA: Diagnosis not present

## 2015-06-19 DIAGNOSIS — M25561 Pain in right knee: Secondary | ICD-10-CM | POA: Diagnosis not present

## 2015-06-19 DIAGNOSIS — M6281 Muscle weakness (generalized): Secondary | ICD-10-CM | POA: Diagnosis not present

## 2015-06-23 DIAGNOSIS — R262 Difficulty in walking, not elsewhere classified: Secondary | ICD-10-CM | POA: Diagnosis not present

## 2015-06-23 DIAGNOSIS — M6281 Muscle weakness (generalized): Secondary | ICD-10-CM | POA: Diagnosis not present

## 2015-06-23 DIAGNOSIS — M25562 Pain in left knee: Secondary | ICD-10-CM | POA: Diagnosis not present

## 2015-06-23 DIAGNOSIS — M25561 Pain in right knee: Secondary | ICD-10-CM | POA: Diagnosis not present

## 2015-06-26 ENCOUNTER — Other Ambulatory Visit (HOSPITAL_BASED_OUTPATIENT_CLINIC_OR_DEPARTMENT_OTHER): Payer: Medicare Other

## 2015-06-26 DIAGNOSIS — C9000 Multiple myeloma not having achieved remission: Secondary | ICD-10-CM | POA: Diagnosis not present

## 2015-06-26 DIAGNOSIS — I2510 Atherosclerotic heart disease of native coronary artery without angina pectoris: Secondary | ICD-10-CM

## 2015-06-26 DIAGNOSIS — I82441 Acute embolism and thrombosis of right tibial vein: Secondary | ICD-10-CM

## 2015-06-26 DIAGNOSIS — C9001 Multiple myeloma in remission: Secondary | ICD-10-CM

## 2015-06-26 LAB — CBC WITH DIFFERENTIAL (CANCER CENTER ONLY)
BASO#: 0 10*3/uL (ref 0.0–0.2)
BASO%: 0.4 % (ref 0.0–2.0)
EOS%: 1.1 % (ref 0.0–7.0)
Eosinophils Absolute: 0.1 10*3/uL (ref 0.0–0.5)
HEMATOCRIT: 36.2 % — AB (ref 38.7–49.9)
HEMOGLOBIN: 11.8 g/dL — AB (ref 13.0–17.1)
LYMPH#: 1.8 10*3/uL (ref 0.9–3.3)
LYMPH%: 24 % (ref 14.0–48.0)
MCH: 29.6 pg (ref 28.0–33.4)
MCHC: 32.6 g/dL (ref 32.0–35.9)
MCV: 91 fL (ref 82–98)
MONO#: 0.9 10*3/uL (ref 0.1–0.9)
MONO%: 12.4 % (ref 0.0–13.0)
NEUT%: 62.1 % (ref 40.0–80.0)
NEUTROS ABS: 4.6 10*3/uL (ref 1.5–6.5)
Platelets: 219 10*3/uL (ref 145–400)
RBC: 3.99 10*6/uL — AB (ref 4.20–5.70)
RDW: 14.7 % (ref 11.1–15.7)
WBC: 7.4 10*3/uL (ref 4.0–10.0)

## 2015-06-26 LAB — CMP (CANCER CENTER ONLY)
ALBUMIN: 3.1 g/dL — AB (ref 3.3–5.5)
ALT: 26 U/L (ref 10–47)
AST: 24 U/L (ref 11–38)
Alkaline Phosphatase: 99 U/L — ABNORMAL HIGH (ref 26–84)
BILIRUBIN TOTAL: 0.7 mg/dL (ref 0.20–1.60)
BUN, Bld: 14 mg/dL (ref 7–22)
CALCIUM: 9.4 mg/dL (ref 8.0–10.3)
CHLORIDE: 105 meq/L (ref 98–108)
CO2: 29 meq/L (ref 18–33)
Creat: 1.1 mg/dl (ref 0.6–1.2)
GLUCOSE: 96 mg/dL (ref 73–118)
Potassium: 4.1 mEq/L (ref 3.3–4.7)
Sodium: 137 mEq/L (ref 128–145)
TOTAL PROTEIN: 6.6 g/dL (ref 6.4–8.1)

## 2015-06-26 LAB — MAGNESIUM: Magnesium: 2.2 mg/dl (ref 1.5–2.5)

## 2015-06-29 ENCOUNTER — Encounter: Payer: Self-pay | Admitting: Sports Medicine

## 2015-06-29 ENCOUNTER — Ambulatory Visit (INDEPENDENT_AMBULATORY_CARE_PROVIDER_SITE_OTHER): Payer: Medicare Other | Admitting: Sports Medicine

## 2015-06-29 VITALS — BP 109/63 | HR 81 | Resp 16 | Wt 204.5 lb

## 2015-06-29 DIAGNOSIS — M5030 Other cervical disc degeneration, unspecified cervical region: Secondary | ICD-10-CM | POA: Diagnosis not present

## 2015-06-29 DIAGNOSIS — I2510 Atherosclerotic heart disease of native coronary artery without angina pectoris: Secondary | ICD-10-CM

## 2015-06-29 DIAGNOSIS — M47815 Spondylosis without myelopathy or radiculopathy, thoracolumbar region: Secondary | ICD-10-CM

## 2015-06-29 MED ORDER — OXYCODONE-ACETAMINOPHEN 10-325 MG PO TABS: 1.0000 | ORAL_TABLET | Freq: Every day | ORAL | Status: DC | PRN

## 2015-06-29 NOTE — Assessment & Plan Note (Signed)
History of cervical fusion, recurrence of pain likely adjacent level disease.  Has failed greater than 4 weeks for formal physical therapy, steroids. MRI with and without IV contrast, renal function is normal and recent, return to discuss epidural.

## 2015-06-29 NOTE — Progress Notes (Signed)
  Subjective:    CC: Follow-up  HPI: Cervical spondylosis: Post-cervical fusion, persistent pain. Radiation to the right upper shoulder and neck. Pain has worsened despite prednisone, and greater than 4 weeks of formal physical therapy.  Past medical history, Surgical history, Family history not pertinant except as noted below, Social history, Allergies, and medications have been entered into the medical record, reviewed, and no changes needed.   Review of Systems: No fevers, chills, night sweats, weight loss, chest pain, or shortness of breath.   Objective:    General: Well Developed, well nourished, and in no acute distress.  Neuro: Alert and oriented x3, extra-ocular muscles intact, sensation grossly intact.  HEENT: Normocephalic, atraumatic, pupils equal round reactive to light, neck supple, no masses, no lymphadenopathy, thyroid nonpalpable.  Skin: Warm and dry, no rashes. Cardiac: Regular rate and rhythm, no murmurs rubs or gallops, no lower extremity edema.  Respiratory: Clear to auscultation bilaterally. Not using accessory muscles, speaking in full sentences.  Impression and Recommendations:    I spent 25 minutes with this patient, greater than 50% was face-to-face time counseling regarding the above diagnoses

## 2015-06-30 DIAGNOSIS — M25562 Pain in left knee: Secondary | ICD-10-CM | POA: Diagnosis not present

## 2015-06-30 DIAGNOSIS — R262 Difficulty in walking, not elsewhere classified: Secondary | ICD-10-CM | POA: Diagnosis not present

## 2015-06-30 DIAGNOSIS — M25561 Pain in right knee: Secondary | ICD-10-CM | POA: Diagnosis not present

## 2015-06-30 DIAGNOSIS — M6281 Muscle weakness (generalized): Secondary | ICD-10-CM | POA: Diagnosis not present

## 2015-07-02 DIAGNOSIS — R262 Difficulty in walking, not elsewhere classified: Secondary | ICD-10-CM | POA: Diagnosis not present

## 2015-07-02 DIAGNOSIS — M6281 Muscle weakness (generalized): Secondary | ICD-10-CM | POA: Diagnosis not present

## 2015-07-02 DIAGNOSIS — M25562 Pain in left knee: Secondary | ICD-10-CM | POA: Diagnosis not present

## 2015-07-02 DIAGNOSIS — M25561 Pain in right knee: Secondary | ICD-10-CM | POA: Diagnosis not present

## 2015-07-04 ENCOUNTER — Ambulatory Visit (HOSPITAL_BASED_OUTPATIENT_CLINIC_OR_DEPARTMENT_OTHER)
Admission: RE | Admit: 2015-07-04 | Discharge: 2015-07-04 | Disposition: A | Discharge status: Home / Self Care | Payer: Medicare Other | Source: Ambulatory Visit | Attending: Sports Medicine | Admitting: Sports Medicine

## 2015-07-04 DIAGNOSIS — M5030 Other cervical disc degeneration, unspecified cervical region: Secondary | ICD-10-CM | POA: Diagnosis present

## 2015-07-04 DIAGNOSIS — M50321 Other cervical disc degeneration at C4-C5 level: Secondary | ICD-10-CM | POA: Diagnosis not present

## 2015-07-04 DIAGNOSIS — M5032 Other cervical disc degeneration, mid-cervical region, unspecified level: Secondary | ICD-10-CM | POA: Diagnosis not present

## 2015-07-04 DIAGNOSIS — M50322 Other cervical disc degeneration at C5-C6 level: Secondary | ICD-10-CM | POA: Insufficient documentation

## 2015-07-04 DIAGNOSIS — M50323 Other cervical disc degeneration at C6-C7 level: Secondary | ICD-10-CM | POA: Diagnosis not present

## 2015-07-04 MED ORDER — GADOBENATE DIMEGLUMINE 529 MG/ML IV SOLN: 17.0000 mL | Freq: Once | INTRAVENOUS | Status: DC | PRN

## 2015-07-06 DIAGNOSIS — M25562 Pain in left knee: Secondary | ICD-10-CM | POA: Diagnosis not present

## 2015-07-06 DIAGNOSIS — M25561 Pain in right knee: Secondary | ICD-10-CM | POA: Diagnosis not present

## 2015-07-06 DIAGNOSIS — M6281 Muscle weakness (generalized): Secondary | ICD-10-CM | POA: Diagnosis not present

## 2015-07-06 DIAGNOSIS — R262 Difficulty in walking, not elsewhere classified: Secondary | ICD-10-CM | POA: Diagnosis not present

## 2015-07-07 DIAGNOSIS — Z9189 Other specified personal risk factors, not elsewhere classified: Secondary | ICD-10-CM | POA: Diagnosis not present

## 2015-07-07 DIAGNOSIS — C9001 Multiple myeloma in remission: Secondary | ICD-10-CM | POA: Diagnosis not present

## 2015-07-07 DIAGNOSIS — C9000 Multiple myeloma not having achieved remission: Secondary | ICD-10-CM | POA: Diagnosis not present

## 2015-07-08 ENCOUNTER — Ambulatory Visit (INDEPENDENT_AMBULATORY_CARE_PROVIDER_SITE_OTHER): Payer: Medicare Other | Admitting: Sports Medicine

## 2015-07-08 ENCOUNTER — Other Ambulatory Visit: Payer: Self-pay | Admitting: *Deleted

## 2015-07-08 ENCOUNTER — Encounter: Payer: Self-pay | Admitting: Sports Medicine

## 2015-07-08 DIAGNOSIS — I2510 Atherosclerotic heart disease of native coronary artery without angina pectoris: Secondary | ICD-10-CM

## 2015-07-08 DIAGNOSIS — M5030 Other cervical disc degeneration, unspecified cervical region: Secondary | ICD-10-CM

## 2015-07-08 MED ORDER — LENALIDOMIDE 10 MG PO CAPS: ORAL_CAPSULE | ORAL | Status: DC

## 2015-07-08 NOTE — Assessment & Plan Note (Signed)
Post C5-C6 fusion, multiple levels of persistent spinal stenosis both above and below the fusion, consistent with adjacent level disease. We are going to proceed with a right C6-C7 interlaminar epidural, and referral that back to Dr. Joya Salm for consideration of extension/revision of fusion.

## 2015-07-08 NOTE — Progress Notes (Signed)
  Subjective:    CC: Follow-up  HPI: This is a pleasant 75 year old male, he is a history of a C5-C6 fusion, unfortunately has had persistent pain on the right side of his neck, not overtly radicular. He failed oral medications and we obtained an MRI the results of which will be dictated below.  Past medical history, Surgical history, Family history not pertinant except as noted below, Social history, Allergies, and medications have been entered into the medical record, reviewed, and no changes needed.   Review of Systems: No fevers, chills, night sweats, weight loss, chest pain, or shortness of breath.   Objective:    General: Well Developed, well nourished, and in no acute distress.  Neuro: Alert and oriented x3, extra-ocular muscles intact, sensation grossly intact.  HEENT: Normocephalic, atraumatic, pupils equal round reactive to light, neck supple, no masses, no lymphadenopathy, thyroid nonpalpable.  Skin: Warm and dry, no rashes. Cardiac: Regular rate and rhythm, no murmurs rubs or gallops, no lower extremity edema.  Respiratory: Clear to auscultation bilaterally. Not using accessory muscles, speaking in full sentences.  MRI shows multilevel disc protrusions,, and slight retrolisthesis at the fusion level.  Impression and Recommendations:

## 2015-07-09 ENCOUNTER — Ambulatory Visit: Payer: Medicare Other | Admitting: Hematology & Oncology

## 2015-07-09 ENCOUNTER — Other Ambulatory Visit: Payer: Medicare Other

## 2015-07-17 ENCOUNTER — Other Ambulatory Visit (HOSPITAL_BASED_OUTPATIENT_CLINIC_OR_DEPARTMENT_OTHER): Payer: Medicare Other

## 2015-07-17 ENCOUNTER — Ambulatory Visit (HOSPITAL_BASED_OUTPATIENT_CLINIC_OR_DEPARTMENT_OTHER): Payer: Medicare Other | Admitting: Hematology & Oncology

## 2015-07-17 ENCOUNTER — Ambulatory Visit (HOSPITAL_BASED_OUTPATIENT_CLINIC_OR_DEPARTMENT_OTHER): Payer: Medicare Other

## 2015-07-17 VITALS — BP 123/60 | HR 68 | Temp 97.4°F | Resp 18 | Ht 72.0 in | Wt 208.0 lb

## 2015-07-17 DIAGNOSIS — C9001 Multiple myeloma in remission: Secondary | ICD-10-CM

## 2015-07-17 DIAGNOSIS — C9002 Multiple myeloma in relapse: Secondary | ICD-10-CM

## 2015-07-17 DIAGNOSIS — I82401 Acute embolism and thrombosis of unspecified deep veins of right lower extremity: Secondary | ICD-10-CM

## 2015-07-17 DIAGNOSIS — C9000 Multiple myeloma not having achieved remission: Secondary | ICD-10-CM

## 2015-07-17 DIAGNOSIS — Z7901 Long term (current) use of anticoagulants: Secondary | ICD-10-CM

## 2015-07-17 LAB — CBC WITH DIFFERENTIAL (CANCER CENTER ONLY)
BASO#: 0 10*3/uL (ref 0.0–0.2)
BASO%: 0.4 % (ref 0.0–2.0)
EOS ABS: 0.1 10*3/uL (ref 0.0–0.5)
EOS%: 1.4 % (ref 0.0–7.0)
HCT: 37.2 % — ABNORMAL LOW (ref 38.7–49.9)
HGB: 12.1 g/dL — ABNORMAL LOW (ref 13.0–17.1)
LYMPH#: 1.2 10*3/uL (ref 0.9–3.3)
LYMPH%: 24.9 % (ref 14.0–48.0)
MCH: 29.3 pg (ref 28.0–33.4)
MCHC: 32.5 g/dL (ref 32.0–35.9)
MCV: 90 fL (ref 82–98)
MONO#: 0.6 10*3/uL (ref 0.1–0.9)
MONO%: 11.5 % (ref 0.0–13.0)
NEUT#: 3.1 10*3/uL (ref 1.5–6.5)
NEUT%: 61.8 % (ref 40.0–80.0)
PLATELETS: 174 10*3/uL (ref 145–400)
RBC: 4.13 10*6/uL — AB (ref 4.20–5.70)
RDW: 14.5 % (ref 11.1–15.7)
WBC: 4.9 10*3/uL (ref 4.0–10.0)

## 2015-07-17 LAB — CMP (CANCER CENTER ONLY)
ALK PHOS: 83 U/L (ref 26–84)
ALT: 27 U/L (ref 10–47)
AST: 31 U/L (ref 11–38)
Albumin: 3.3 g/dL (ref 3.3–5.5)
BUN: 12 mg/dL (ref 7–22)
CALCIUM: 8.9 mg/dL (ref 8.0–10.3)
CHLORIDE: 104 meq/L (ref 98–108)
CO2: 26 meq/L (ref 18–33)
Creat: 1.3 mg/dl — ABNORMAL HIGH (ref 0.6–1.2)
GLUCOSE: 145 mg/dL — AB (ref 73–118)
POTASSIUM: 3.4 meq/L (ref 3.3–4.7)
Sodium: 139 mEq/L (ref 128–145)
Total Bilirubin: 1 mg/dl (ref 0.20–1.60)
Total Protein: 6.5 g/dL (ref 6.4–8.1)

## 2015-07-17 LAB — MAGNESIUM: MAGNESIUM: 2.1 mg/dL (ref 1.5–2.5)

## 2015-07-17 MED ORDER — ZOLEDRONIC ACID 4 MG/100ML IV SOLN
4.0000 mg | Freq: Once | INTRAVENOUS | Status: AC
  Administered 2015-07-17: 4 mg via INTRAVENOUS
  Filled 2015-07-17: qty 100

## 2015-07-17 NOTE — Patient Instructions (Signed)

## 2015-07-17 NOTE — Progress Notes (Signed)
Hematology and Oncology Follow Up Visit  [name] [name] [id number] 02-09-1941 75 y.o. 07/17/2015   Principle Diagnosis:   IgG kappa myeloma - Trisomy 11 by FISH  Acute thromboembolism of the right lower leg  Current Therapy:    Patient  s/p cycle #5 of Velcade/Revlimid/Decadron  S/P ASCT at [facility] on 04/02/2015  Revlimid 10mg  po q day (21/28)  Zometa 4 mg IV every month       Xarelto 10 mg by mouth daily   History:  [name] [name] is back for follow-up.  He is feeling better and better. He was seen [facility] about a week ago. It was felt that he can be started on Revlimid for maintenance therapy. As such, we will get him on 10 mg of Revlimid daily for 21 out of 28 days.  He is having some frequent bowel movements. These are not diarrhea. He's not sure why he's having these. He feels that he is "cleaned out".  He's had no problems with fever. He's had no cough or shortness of breath.  He is exercising. He has the gym every day. He really enjoys this.  He is on low-dose Xarelto now. I want him on Xarelto all he is on Revlimid. He has a history of the thromboembolism of the right leg. He wears compression stockings. His legs do not bother him.  Overall, is performance status is ECOG 1.   Medications:  Current outpatient prescriptions:  Marland Kitchen  Vitamin D, Ergocalciferol, (DRISDOL) 50000 units CAPS capsule, Take by mouth., Disp: , Rfl:  .  amLODipine (NORVASC) 10 MG tablet, Take 2 tablets (20 mg total) by mouth daily., Disp: 60 tablet, Rfl: 1 .  carvedilol (COREG) 6.25 MG tablet, Take 1 tablet (6.25 mg total) by mouth 2 (two) times daily with a meal., Disp: 180 tablet, Rfl: 3 .  Cyanocobalamin (RA VITAMIN B-12 TR) 1000 MCG TBCR, Take by mouth., Disp: , Rfl:  .  esomeprazole (NEXIUM) 40 MG capsule, Take 20 mg by mouth daily at 12 noon. , Disp: , Rfl:  .  famciclovir (FAMVIR) 500 MG tablet, Take 1 tablet (500 mg total) by mouth daily., Disp: 90 tablet, Rfl: 3 .  gabapentin (NEURONTIN) 600 MG  tablet, 600 mg in the morning, 1200 mg at night, Disp: 90 tablet, Rfl: 11 .  hydrochlorothiazide (HYDRODIURIL) 25 MG tablet, Take 1 tablet (25 mg total) by mouth daily., Disp: 90 tablet, Rfl: 3 .  isosorbide mononitrate (IMDUR) 30 MG 24 hr tablet, Take 1 tablet (30 mg total) by mouth 2 (two) times daily. (Patient taking differently: Take 30 mg by mouth 2 (two) times daily. PATIENT TAKES 60 MG DAILY), Disp: 180 tablet, Rfl: 3 .  lenalidomide (REVLIMID) 10 MG capsule, Take daily, days 1-21. Off for 7 days. Repeat every 28 days. [id number], Disp: 21 capsule, Rfl: 0 .  Loratadine 10 MG CAPS, Take by mouth., Disp: , Rfl:  .  LORazepam (ATIVAN) 1 MG tablet, TAKE 1 TABLET AT BEDTIME, Disp: 90 tablet, Rfl: 1 .  montelukast (SINGULAIR) 10 MG tablet, Take 1 tablet (10 mg total) by mouth at bedtime., Disp: 90 tablet, Rfl: 3 .  nitroGLYCERIN (NITROSTAT) 0.4 MG SL tablet, Place 0.4 mg under the tongue every 5 (five) minutes as needed. For chest pain, Disp: , Rfl:  .  oxyCODONE-acetaminophen (PERCOCET) 10-325 MG tablet, Take 1 tablet by mouth daily as needed for pain., Disp: 30 tablet, Rfl: 0 .  potassium chloride SA (K-DUR,KLOR-CON) 20 MEQ tablet, Take 1 tablet (20 mEq total) by  mouth 2 (two) times daily., Disp: 180 tablet, Rfl: 0 .  pyridoxine (B-6) 100 MG tablet, Take by mouth., Disp: , Rfl:  .  rivaroxaban (XARELTO) 10 MG TABS tablet, Take 1 tablet (10 mg total) by mouth daily with supper. 90 day supply, Disp: 30 tablet, Rfl: 6 No current facility-administered medications for this visit.  Facility-Administered Medications Ordered in Other Visits:  .  zolendronic acid (ZOMETA) 3.3 mg in sodium chloride 0.9 % 100 mL IVPB, 3.3 mg, Intravenous, Once, [name], MD  Allergies:  Allergies  Allergen Reactions  . Hydrocodone-Acetaminophen Itching  . Budesonide-Formoterol Fumarate Other (See Comments)  . Other Itching    Flu-like symptoms from portabello mushrooms  . Codeine Itching  . Mushroom Extract  Complex Nausea And Vomiting    Only a certain type of mushroom (name unknown) causes this reaction.    Past Medical History, Surgical history, Social history, and Family History were reviewed and updated.  Review of Systems: As above  Physical Exam:  height is 6' (1.829 m) and weight is 208 lb (94.348 kg). His oral temperature is 97.4 F (36.3 C). His blood pressure is 123/60 and his pulse is 68. His respiration is 18.   Wt Readings from Last 3 Encounters:  07/17/15 208 lb (94.348 kg)  07/08/15 204 lb 6.4 oz (92.715 kg)  06/29/15 204 lb 8 oz (92.761 kg)     Well-developed and well-nourished white Male in no obvious distress. Head and neck exam shows no ocular or oral lesions. He now has alopecia There are no palpable cervical or supraclavicular lymph nodes. Lungs are clear. Cardiac exam regular rate and rhythm with no murmurs, rubs or bruits. Abdomen is soft. He has good bowel sounds. There is no fluid wave. There is no palpable liver or spleen tip. Back exam shows no tenderness over the spine, ribs or hips. He is wearing a back brace. Extremities shows no clubbing, cyanosis or edema. There might be some slight swelling of the right lower leg. No obvious venous cord is noted. He has a negative Homans sign with the right leg. He has good pulses in his distal extremities. Neurological exam is nonfocal. Skin exam shows no rashes, ecchymoses or petechia.   Impression and Plan: [name] [name] is 75 year old male. He has IgG kappa myeloma. He has had  5 cycles  of chemotherapy.  I suspect that the Revlimid will do okay for him. We will go ahead and give him Zometa today.  I'll plan to see back in one month.    [name], MD 6/2/20172:07 PM

## 2015-07-18 LAB — IGG, IGA, IGM
IGG (IMMUNOGLOBIN G), SERUM: 724 mg/dL (ref 700–1600)
IgA, Qn, Serum: 34 mg/dL — ABNORMAL LOW (ref 61–437)
IgM, Qn, Serum: 26 mg/dL (ref 15–143)

## 2015-07-19 ENCOUNTER — Other Ambulatory Visit: Payer: Self-pay | Admitting: Hematology & Oncology

## 2015-07-19 ENCOUNTER — Other Ambulatory Visit: Payer: Self-pay | Admitting: Sports Medicine

## 2015-07-23 DIAGNOSIS — Z6828 Body mass index (BMI) 28.0-28.9, adult: Secondary | ICD-10-CM | POA: Diagnosis not present

## 2015-07-23 DIAGNOSIS — M5021 Other cervical disc displacement,  high cervical region: Secondary | ICD-10-CM | POA: Diagnosis not present

## 2015-07-24 ENCOUNTER — Ambulatory Visit
Admission: RE | Admit: 2015-07-24 | Discharge: 2015-07-24 | Disposition: A | Discharge status: Home / Self Care | Payer: Medicare Other | Source: Ambulatory Visit | Attending: Sports Medicine | Admitting: Sports Medicine

## 2015-07-24 DIAGNOSIS — M47812 Spondylosis without myelopathy or radiculopathy, cervical region: Secondary | ICD-10-CM | POA: Diagnosis not present

## 2015-07-24 MED ORDER — TRIAMCINOLONE ACETONIDE 40 MG/ML IJ SUSP (RADIOLOGY)
60.0000 mg | Freq: Once | INTRAMUSCULAR | Status: AC
  Administered 2015-07-24: 60 mg via EPIDURAL

## 2015-07-24 MED ORDER — IOHEXOL 300 MG/ML  SOLN
1.0000 mL | Freq: Once | INTRAMUSCULAR | Status: AC | PRN
  Administered 2015-07-24: 1 mL via EPIDURAL

## 2015-07-24 NOTE — Discharge Instructions (Addendum)

## 2015-08-03 ENCOUNTER — Encounter: Payer: Self-pay | Admitting: Sports Medicine

## 2015-08-03 ENCOUNTER — Other Ambulatory Visit: Payer: Self-pay | Admitting: Sports Medicine

## 2015-08-03 DIAGNOSIS — M47815 Spondylosis without myelopathy or radiculopathy, thoracolumbar region: Secondary | ICD-10-CM

## 2015-08-03 MED ORDER — OXYCODONE-ACETAMINOPHEN 10-325 MG PO TABS: 1.0000 | ORAL_TABLET | Freq: Every day | ORAL | Status: DC | PRN

## 2015-08-03 NOTE — Addendum Note (Signed)
Addended by: Silverio Decamp on: 08/03/2015 02:40 PM   Modules accepted: Orders

## 2015-08-05 ENCOUNTER — Ambulatory Visit: Payer: Medicare Other | Admitting: Sports Medicine

## 2015-08-06 ENCOUNTER — Other Ambulatory Visit: Payer: Self-pay | Admitting: Sports Medicine

## 2015-08-06 DIAGNOSIS — M5030 Other cervical disc degeneration, unspecified cervical region: Secondary | ICD-10-CM

## 2015-08-06 NOTE — Assessment & Plan Note (Signed)
Ordering repeat epidural.

## 2015-08-06 NOTE — Progress Notes (Signed)
Imaging notified of epidural order.

## 2015-08-07 ENCOUNTER — Other Ambulatory Visit: Payer: Self-pay | Admitting: *Deleted

## 2015-08-07 MED ORDER — LENALIDOMIDE 10 MG PO CAPS: ORAL_CAPSULE | ORAL | Status: DC

## 2015-08-11 DIAGNOSIS — I209 Angina pectoris, unspecified: Secondary | ICD-10-CM | POA: Diagnosis not present

## 2015-08-11 DIAGNOSIS — I2582 Chronic total occlusion of coronary artery: Secondary | ICD-10-CM | POA: Diagnosis not present

## 2015-08-11 DIAGNOSIS — I10 Essential (primary) hypertension: Secondary | ICD-10-CM | POA: Diagnosis not present

## 2015-08-11 DIAGNOSIS — G4733 Obstructive sleep apnea (adult) (pediatric): Secondary | ICD-10-CM | POA: Diagnosis not present

## 2015-08-11 DIAGNOSIS — Z955 Presence of coronary angioplasty implant and graft: Secondary | ICD-10-CM | POA: Diagnosis not present

## 2015-08-13 ENCOUNTER — Ambulatory Visit: Payer: Medicare Other | Admitting: Hematology & Oncology

## 2015-08-13 ENCOUNTER — Other Ambulatory Visit: Payer: Medicare Other

## 2015-08-17 ENCOUNTER — Ambulatory Visit: Payer: Medicare Other

## 2015-08-17 ENCOUNTER — Ambulatory Visit: Payer: Medicare Other | Admitting: Family

## 2015-08-17 ENCOUNTER — Other Ambulatory Visit: Payer: Medicare Other

## 2015-08-17 DIAGNOSIS — H25811 Combined forms of age-related cataract, right eye: Secondary | ICD-10-CM | POA: Diagnosis not present

## 2015-08-17 DIAGNOSIS — H16223 Keratoconjunctivitis sicca, not specified as Sjogren's, bilateral: Secondary | ICD-10-CM | POA: Diagnosis not present

## 2015-08-17 DIAGNOSIS — H25813 Combined forms of age-related cataract, bilateral: Secondary | ICD-10-CM | POA: Diagnosis not present

## 2015-08-19 ENCOUNTER — Ambulatory Visit
Admission: RE | Admit: 2015-08-19 | Discharge: 2015-08-19 | Disposition: A | Discharge status: Home / Self Care | Payer: Medicare Other | Source: Ambulatory Visit | Attending: Sports Medicine | Admitting: Sports Medicine

## 2015-08-19 DIAGNOSIS — M5030 Other cervical disc degeneration, unspecified cervical region: Secondary | ICD-10-CM

## 2015-08-19 DIAGNOSIS — M47812 Spondylosis without myelopathy or radiculopathy, cervical region: Secondary | ICD-10-CM | POA: Diagnosis not present

## 2015-08-19 MED ORDER — TRIAMCINOLONE ACETONIDE 40 MG/ML IJ SUSP (RADIOLOGY)
60.0000 mg | Freq: Once | INTRAMUSCULAR | Status: AC
  Administered 2015-08-19: 60 mg via EPIDURAL

## 2015-08-19 MED ORDER — IOHEXOL 300 MG/ML  SOLN
1.0000 mL | Freq: Once | INTRAMUSCULAR | Status: AC | PRN
  Administered 2015-08-19: 1 mL via EPIDURAL

## 2015-08-19 NOTE — Discharge Instructions (Signed)

## 2015-08-20 ENCOUNTER — Ambulatory Visit (HOSPITAL_BASED_OUTPATIENT_CLINIC_OR_DEPARTMENT_OTHER): Payer: Medicare Other

## 2015-08-20 ENCOUNTER — Other Ambulatory Visit (HOSPITAL_BASED_OUTPATIENT_CLINIC_OR_DEPARTMENT_OTHER): Payer: Medicare Other

## 2015-08-20 ENCOUNTER — Encounter: Payer: Self-pay | Admitting: Family

## 2015-08-20 ENCOUNTER — Ambulatory Visit (HOSPITAL_BASED_OUTPATIENT_CLINIC_OR_DEPARTMENT_OTHER): Payer: Medicare Other | Admitting: Family

## 2015-08-20 VITALS — BP 135/70 | HR 64 | Temp 97.8°F | Resp 18 | Ht 72.0 in | Wt 202.0 lb

## 2015-08-20 DIAGNOSIS — C9001 Multiple myeloma in remission: Secondary | ICD-10-CM

## 2015-08-20 DIAGNOSIS — I82441 Acute embolism and thrombosis of right tibial vein: Secondary | ICD-10-CM

## 2015-08-20 DIAGNOSIS — Z7901 Long term (current) use of anticoagulants: Secondary | ICD-10-CM | POA: Diagnosis not present

## 2015-08-20 DIAGNOSIS — I82401 Acute embolism and thrombosis of unspecified deep veins of right lower extremity: Secondary | ICD-10-CM | POA: Diagnosis not present

## 2015-08-20 DIAGNOSIS — C9000 Multiple myeloma not having achieved remission: Secondary | ICD-10-CM | POA: Diagnosis not present

## 2015-08-20 DIAGNOSIS — R194 Change in bowel habit: Secondary | ICD-10-CM

## 2015-08-20 LAB — CBC WITH DIFFERENTIAL (CANCER CENTER ONLY)
BASO#: 0.1 10*3/uL (ref 0.0–0.2)
BASO%: 1.6 % (ref 0.0–2.0)
EOS ABS: 0 10*3/uL (ref 0.0–0.5)
EOS%: 0.3 % (ref 0.0–7.0)
HCT: 39.1 % (ref 38.7–49.9)
HEMOGLOBIN: 13.1 g/dL (ref 13.0–17.1)
LYMPH#: 1.5 10*3/uL (ref 0.9–3.3)
LYMPH%: 24.2 % (ref 14.0–48.0)
MCH: 28.4 pg (ref 28.0–33.4)
MCHC: 33.5 g/dL (ref 32.0–35.9)
MCV: 85 fL (ref 82–98)
MONO#: 0.7 10*3/uL (ref 0.1–0.9)
MONO%: 11.3 % (ref 0.0–13.0)
NEUT%: 62.6 % (ref 40.0–80.0)
NEUTROS ABS: 3.8 10*3/uL (ref 1.5–6.5)
PLATELETS: 228 10*3/uL (ref 145–400)
RBC: 4.62 10*6/uL (ref 4.20–5.70)
RDW: 13.9 % (ref 11.1–15.7)
WBC: 6.1 10*3/uL (ref 4.0–10.0)

## 2015-08-20 LAB — CMP (CANCER CENTER ONLY)
ALBUMIN: 3.2 g/dL — AB (ref 3.3–5.5)
ALK PHOS: 88 U/L — AB (ref 26–84)
ALT: 30 U/L (ref 10–47)
AST: 21 U/L (ref 11–38)
BUN: 16 mg/dL (ref 7–22)
CHLORIDE: 103 meq/L (ref 98–108)
CO2: 25 mEq/L (ref 18–33)
CREATININE: 0.7 mg/dL (ref 0.6–1.2)
Calcium: 9 mg/dL (ref 8.0–10.3)
Glucose, Bld: 155 mg/dL — ABNORMAL HIGH (ref 73–118)
Potassium: 3.2 mEq/L — ABNORMAL LOW (ref 3.3–4.7)
SODIUM: 136 meq/L (ref 128–145)
TOTAL PROTEIN: 6.6 g/dL (ref 6.4–8.1)
Total Bilirubin: 0.7 mg/dl (ref 0.20–1.60)

## 2015-08-20 MED ORDER — RIVAROXABAN 20 MG PO TABS: 20.0000 mg | ORAL_TABLET | Freq: Every day | ORAL | Status: DC

## 2015-08-20 MED ORDER — ZOLEDRONIC ACID 4 MG/100ML IV SOLN
4.0000 mg | Freq: Once | INTRAVENOUS | Status: AC
Start: 2015-08-20 — End: 2015-08-20
  Administered 2015-08-20: 4 mg via INTRAVENOUS
  Filled 2015-08-20: qty 100

## 2015-08-20 MED ORDER — SODIUM CHLORIDE 0.9 % IV SOLN
INTRAVENOUS | Status: DC
  Administered 2015-08-20: 13:00:00 via INTRAVENOUS

## 2015-08-20 NOTE — Progress Notes (Signed)
Hematology and Oncology Follow Up Visit  [name] [name] [id number] 1940/08/29 75 y.o. 08/20/2015   Principle Diagnosis:  IgG kappa myeloma - Trisomy 11 by FISH Acute thromboembolism of the right lower leg  Current Therapy:   Patient s/p cycle 5 of Velcade/Revlimid/Decadron S/P ASCT at [facility] on 04/02/2015 Revlimid 10mg  po q day (21/28) Zometa 4 mg IV every month Xarelto 10 mg by mouth daily    Interim History: [name] is here today for follow-up and Zometa. He is doing fairly well but still having 3-4 bowel movements a day. There is no extreme foul odor or diarrhea. However, he is having a good deal of flatus. He does admit to having had a stomach bug last weekend after eating at a bad restaurant. He took pepto bismol and had a few dark stools that have since resolved.  No fever, chills, n/v, cough, rash, dizziness, SOB, chest pain, abdominal pain or changes in bladder habits.     He has run out of Revlimid and is currently on his 7 day break. The grant company has given him some issues and Lexine Baton is helping him work through this to get re-approved.  He will be having cataract surgery on both eyes on Monday.  No lymphadenopathy found on exam. No episodes of bleeding or bruising.  No swelling or tenderness in his extremities. The neuropathy in his legs is much improved on Neurontin.  He has maintained a good appetite and is staying well hydrated. His weight is stable.   Medications:    Medication List       This list is accurate as of: 08/20/15 11:53 AM.  Always use your most recent med list.               amLODipine 10 MG tablet  Commonly known as:  NORVASC  Take 2 tablets (20 mg total) by mouth daily.     aspirin EC 81 MG tablet  Take by mouth.     carvedilol 6.25 MG tablet  Commonly known as:  COREG  Take 1 tablet (6.25 mg total) by mouth 2 (two) times daily with a meal.     esomeprazole 40 MG capsule  Commonly known as:  NEXIUM  Take 20 mg by mouth daily at 12  noon.     famciclovir 500 MG tablet  Commonly known as:  FAMVIR  TAKE 1 TABLET EVERY DAY     gabapentin 600 MG tablet  Commonly known as:  NEURONTIN  600 mg in the morning, 1200 mg at night     hydrochlorothiazide 25 MG tablet  Commonly known as:  HYDRODIURIL  Take 1 tablet (25 mg total) by mouth daily.     isosorbide mononitrate 30 MG 24 hr tablet  Commonly known as:  IMDUR  Take 1 tablet (30 mg total) by mouth 2 (two) times daily.     lenalidomide 10 MG capsule  Commonly known as:  REVLIMID  Take daily, days 1-21. Off for 7 days. Repeat every 28 days. [id number]     Loratadine 10 MG Caps  Take by mouth.     LORazepam 1 MG tablet  Commonly known as:  ATIVAN  TAKE 1 TABLET AT BEDTIME     montelukast 10 MG tablet  Commonly known as:  SINGULAIR  Take 1 tablet (10 mg total) by mouth at bedtime.     nitroGLYCERIN 0.4 MG SL tablet  Commonly known as:  NITROSTAT  Place 0.4 mg under the tongue every 5 (five) minutes as  needed. For chest pain     oxyCODONE-acetaminophen 10-325 MG tablet  Commonly known as:  PERCOCET  Take 1 tablet by mouth daily as needed for pain.     potassium chloride SA 20 MEQ tablet  Commonly known as:  K-DUR,KLOR-CON  Take 1 tablet (20 mEq total) by mouth 2 (two) times daily.     pyridoxine 100 MG tablet  Commonly known as:  B-6  Take by mouth.     RA VITAMIN B-12 TR 1000 MCG Tbcr  Generic drug:  Cyanocobalamin  Take by mouth.     rivaroxaban 10 MG Tabs tablet  Commonly known as:  XARELTO  Take 1 tablet (10 mg total) by mouth daily with supper. 90 day supply     Vitamin D (Ergocalciferol) 50000 units Caps capsule  Commonly known as:  DRISDOL  Take by mouth.        Allergies:  Allergies  Allergen Reactions  . Budesonide-Formoterol Fumarate Itching  . Codeine Itching  . Hydrocodone-Acetaminophen Itching  . Mushroom Extract Complex Nausea And Vomiting    Only shitake mushroom  causes this reaction.    Past Medical History,  Surgical history, Social history, and Family History were reviewed and updated.  Review of Systems: All other 10 point review of systems is negative.   Physical Exam:  vitals were not taken for this visit.  Wt Readings from Last 3 Encounters:  07/17/15 208 lb (94.348 kg)  07/08/15 204 lb 6.4 oz (92.715 kg)  06/29/15 204 lb 8 oz (92.761 kg)    Ocular: Sclerae unicteric, pupils equal, round and reactive to light Ear-nose-throat: Oropharynx clear, dentition fair Lymphatic: No cervical supraclavicular or axillary adenopathy Lungs no rales or rhonchi, good excursion bilaterally Heart regular rate and rhythm, no murmur appreciated Abd soft, nontender, positive bowel sounds, no liver or spleen tip palpated on exam, no fluid wave MSK no focal spinal tenderness, no joint edema Neuro: non-focal, well-oriented, appropriate affect Breasts: Deferred  Lab Results  Component Value Date   WBC 6.1 08/20/2015   HGB 13.1 08/20/2015   HCT 39.1 08/20/2015   MCV 85 08/20/2015   PLT 228 08/20/2015   Lab Results  Component Value Date   FERRITIN 53 02/19/2013   IRON 45 02/19/2013   TIBC 320 02/19/2013   UIBC 275 02/19/2013   IRONPCTSAT 14* 02/19/2013   Lab Results  Component Value Date   RBC 4.62 08/20/2015   Lab Results  Component Value Date   KPAFRELGTCHN 1.36 02/11/2015   LAMBDASER 1.09 02/11/2015   KAPLAMBRATIO 1.67* 06/05/2015   Lab Results  Component Value Date   IGGSERUM 724 07/17/2015   IGA 64* 02/11/2015   IGMSERUM 26 07/17/2015   Lab Results  Component Value Date   TOTALPROTELP 6.0* 02/11/2015   ALBUMINELP 3.2* 02/11/2015   A1GS 0.4* 02/11/2015   A2GS 0.9 02/11/2015   BETS 0.5 02/11/2015   BETA2SER 0.3 02/11/2015   GAMS 0.6* 02/11/2015   MSPIKE Not Observed 06/05/2015   SPEI * 02/11/2015     Chemistry      Component Value Date/Time   NA 139 07/17/2015 1303   NA 141 06/05/2015 1036   NA 141 03/09/2015 0929   K 3.4 07/17/2015 1303   K 2.9* 06/05/2015 1036    K 3.6 03/09/2015 0929   CL 104 07/17/2015 1303   CL 106 03/09/2015 0929   CO2 26 07/17/2015 1303   CO2 27 06/05/2015 1036   CO2 30 03/09/2015 0929   BUN 12 07/17/2015 1303  BUN 13.1 06/05/2015 1036   BUN 16 03/09/2015 0929   CREATININE 1.3* 07/17/2015 1303   CREATININE 1.0 06/05/2015 1036   CREATININE 1.18 11/03/2014 1055      Component Value Date/Time   CALCIUM 8.9 07/17/2015 1303   CALCIUM 9.4 06/05/2015 1036   CALCIUM 8.8 03/09/2015 0929   ALKPHOS 83 07/17/2015 1303   ALKPHOS 94 06/05/2015 1036   ALKPHOS 80 03/09/2015 0929   AST 31 07/17/2015 1303   AST 22 06/05/2015 1036   AST 17 03/09/2015 0929   ALT 27 07/17/2015 1303   ALT 18 06/05/2015 1036   ALT 19 03/09/2015 0929   BILITOT 1.00 07/17/2015 1303   BILITOT 0.59 06/05/2015 1036   BILITOT 0.8 03/09/2015 0929     Impression and Plan: Mr. Trabucco is a 75 yo male with history of IgG kappa myeloma. He has completed 5 cycles of chemotherapy and is doing well now on Revlimid. His myeloma studies today are pending.  He is still having frequent bowel movements. We will go ahead and test him for C Diff and hemoccult blood. He has the supplies and will bring his samples in next week.  He will get Zometa today as planned.  He will continue on his same dose of Xarelto 10 mg PO (1/2 tablet) daily. He will also take 1 baby aspirin daily.  We will plan to see him back in 1 months for repeat lab work and follow-up.  He will contact us with any questions or concerns. We can certainly see her sooner if need be.   Eliezer Bottom, NP 7/6/201711:53 AM

## 2015-08-20 NOTE — Patient Instructions (Signed)

## 2015-08-21 ENCOUNTER — Encounter: Payer: Self-pay | Admitting: Hematology & Oncology

## 2015-08-21 LAB — KAPPA/LAMBDA LIGHT CHAINS
Ig Kappa Free Light Chain: 18.3 mg/L (ref 3.3–19.4)
Ig Lambda Free Light Chain: 16 mg/L (ref 5.7–26.3)
KAPPA/LAMBDA FLC RATIO: 1.14 (ref 0.26–1.65)

## 2015-08-24 DIAGNOSIS — Z885 Allergy status to narcotic agent status: Secondary | ICD-10-CM | POA: Diagnosis not present

## 2015-08-24 DIAGNOSIS — E1122 Type 2 diabetes mellitus with diabetic chronic kidney disease: Secondary | ICD-10-CM | POA: Diagnosis not present

## 2015-08-24 DIAGNOSIS — I2510 Atherosclerotic heart disease of native coronary artery without angina pectoris: Secondary | ICD-10-CM | POA: Diagnosis not present

## 2015-08-24 DIAGNOSIS — K219 Gastro-esophageal reflux disease without esophagitis: Secondary | ICD-10-CM | POA: Diagnosis not present

## 2015-08-24 DIAGNOSIS — H2511 Age-related nuclear cataract, right eye: Secondary | ICD-10-CM | POA: Diagnosis not present

## 2015-08-24 DIAGNOSIS — Z7901 Long term (current) use of anticoagulants: Secondary | ICD-10-CM | POA: Diagnosis not present

## 2015-08-24 DIAGNOSIS — H25813 Combined forms of age-related cataract, bilateral: Secondary | ICD-10-CM | POA: Diagnosis not present

## 2015-08-24 DIAGNOSIS — C9000 Multiple myeloma not having achieved remission: Secondary | ICD-10-CM | POA: Diagnosis not present

## 2015-08-24 DIAGNOSIS — Z87891 Personal history of nicotine dependence: Secondary | ICD-10-CM | POA: Diagnosis not present

## 2015-08-24 DIAGNOSIS — Z86711 Personal history of pulmonary embolism: Secondary | ICD-10-CM | POA: Diagnosis not present

## 2015-08-24 DIAGNOSIS — H16223 Keratoconjunctivitis sicca, not specified as Sjogren's, bilateral: Secondary | ICD-10-CM | POA: Diagnosis not present

## 2015-08-24 DIAGNOSIS — I129 Hypertensive chronic kidney disease with stage 1 through stage 4 chronic kidney disease, or unspecified chronic kidney disease: Secondary | ICD-10-CM | POA: Diagnosis not present

## 2015-08-24 DIAGNOSIS — N189 Chronic kidney disease, unspecified: Secondary | ICD-10-CM | POA: Diagnosis not present

## 2015-08-24 DIAGNOSIS — E785 Hyperlipidemia, unspecified: Secondary | ICD-10-CM | POA: Diagnosis not present

## 2015-08-24 DIAGNOSIS — Z79899 Other long term (current) drug therapy: Secondary | ICD-10-CM | POA: Diagnosis not present

## 2015-08-24 DIAGNOSIS — G4730 Sleep apnea, unspecified: Secondary | ICD-10-CM | POA: Diagnosis not present

## 2015-08-24 DIAGNOSIS — Z955 Presence of coronary angioplasty implant and graft: Secondary | ICD-10-CM | POA: Diagnosis not present

## 2015-08-24 DIAGNOSIS — H25811 Combined forms of age-related cataract, right eye: Secondary | ICD-10-CM | POA: Diagnosis not present

## 2015-08-24 DIAGNOSIS — E1136 Type 2 diabetes mellitus with diabetic cataract: Secondary | ICD-10-CM | POA: Diagnosis not present

## 2015-08-24 DIAGNOSIS — J449 Chronic obstructive pulmonary disease, unspecified: Secondary | ICD-10-CM | POA: Diagnosis not present

## 2015-08-24 DIAGNOSIS — Z91018 Allergy to other foods: Secondary | ICD-10-CM | POA: Diagnosis not present

## 2015-08-24 DIAGNOSIS — Z888 Allergy status to other drugs, medicaments and biological substances status: Secondary | ICD-10-CM | POA: Diagnosis not present

## 2015-08-24 LAB — MULTIPLE MYELOMA PANEL, SERUM
ALBUMIN SERPL ELPH-MCNC: 3.6 g/dL (ref 2.9–4.4)
ALPHA 1: 0.2 g/dL (ref 0.0–0.4)
ALPHA2 GLOB SERPL ELPH-MCNC: 0.8 g/dL (ref 0.4–1.0)
Albumin/Glob SerPl: 1.3 (ref 0.7–1.7)
B-Globulin SerPl Elph-Mcnc: 1.1 g/dL (ref 0.7–1.3)
Gamma Glob SerPl Elph-Mcnc: 0.9 g/dL (ref 0.4–1.8)
Globulin, Total: 2.9 g/dL (ref 2.2–3.9)
IGA/IMMUNOGLOBULIN A, SERUM: 70 mg/dL (ref 61–437)
IGG (IMMUNOGLOBIN G), SERUM: 854 mg/dL (ref 700–1600)
IGM (IMMUNOGLOBIN M), SRM: 70 mg/dL (ref 15–143)
TOTAL PROTEIN: 6.5 g/dL (ref 6.0–8.5)

## 2015-08-25 ENCOUNTER — Ambulatory Visit (HOSPITAL_BASED_OUTPATIENT_CLINIC_OR_DEPARTMENT_OTHER): Payer: Medicare Other

## 2015-08-25 ENCOUNTER — Encounter: Payer: Self-pay | Admitting: *Deleted

## 2015-08-25 ENCOUNTER — Encounter: Payer: Self-pay | Admitting: Hematology & Oncology

## 2015-08-25 DIAGNOSIS — I82441 Acute embolism and thrombosis of right tibial vein: Secondary | ICD-10-CM

## 2015-08-25 DIAGNOSIS — R194 Change in bowel habit: Secondary | ICD-10-CM

## 2015-08-25 DIAGNOSIS — R198 Other specified symptoms and signs involving the digestive system and abdomen: Secondary | ICD-10-CM

## 2015-08-25 DIAGNOSIS — C9000 Multiple myeloma not having achieved remission: Secondary | ICD-10-CM

## 2015-08-25 DIAGNOSIS — H2512 Age-related nuclear cataract, left eye: Secondary | ICD-10-CM | POA: Diagnosis not present

## 2015-08-25 DIAGNOSIS — C9001 Multiple myeloma in remission: Secondary | ICD-10-CM

## 2015-08-25 LAB — FECAL OCCULT BLOOD, GUIAC - CHCC SATELLITE: OCCULT BLOOD: POSITIVE

## 2015-08-27 ENCOUNTER — Other Ambulatory Visit: Payer: Self-pay

## 2015-08-27 MED ORDER — LENALIDOMIDE 10 MG PO CAPS: ORAL_CAPSULE | ORAL | Status: DC

## 2015-09-01 ENCOUNTER — Other Ambulatory Visit: Payer: Self-pay | Admitting: Family

## 2015-09-01 DIAGNOSIS — C9001 Multiple myeloma in remission: Secondary | ICD-10-CM

## 2015-09-01 DIAGNOSIS — R195 Other fecal abnormalities: Secondary | ICD-10-CM

## 2015-09-01 NOTE — Progress Notes (Signed)
Referral placed for Gi at [place] in [place]. Patient stool hemoccult positive.

## 2015-09-04 DIAGNOSIS — H2511 Age-related nuclear cataract, right eye: Secondary | ICD-10-CM | POA: Diagnosis not present

## 2015-09-04 DIAGNOSIS — Z9481 Bone marrow transplant status: Secondary | ICD-10-CM | POA: Diagnosis not present

## 2015-09-04 DIAGNOSIS — I129 Hypertensive chronic kidney disease with stage 1 through stage 4 chronic kidney disease, or unspecified chronic kidney disease: Secondary | ICD-10-CM | POA: Diagnosis not present

## 2015-09-04 DIAGNOSIS — C9000 Multiple myeloma not having achieved remission: Secondary | ICD-10-CM | POA: Diagnosis not present

## 2015-09-04 DIAGNOSIS — E785 Hyperlipidemia, unspecified: Secondary | ICD-10-CM | POA: Diagnosis not present

## 2015-09-04 DIAGNOSIS — G4730 Sleep apnea, unspecified: Secondary | ICD-10-CM | POA: Diagnosis not present

## 2015-09-04 DIAGNOSIS — E1122 Type 2 diabetes mellitus with diabetic chronic kidney disease: Secondary | ICD-10-CM | POA: Diagnosis not present

## 2015-09-04 DIAGNOSIS — Z79899 Other long term (current) drug therapy: Secondary | ICD-10-CM | POA: Diagnosis not present

## 2015-09-04 DIAGNOSIS — Z91018 Allergy to other foods: Secondary | ICD-10-CM | POA: Diagnosis not present

## 2015-09-04 DIAGNOSIS — Z885 Allergy status to narcotic agent status: Secondary | ICD-10-CM | POA: Diagnosis not present

## 2015-09-04 DIAGNOSIS — I2510 Atherosclerotic heart disease of native coronary artery without angina pectoris: Secondary | ICD-10-CM | POA: Diagnosis not present

## 2015-09-04 DIAGNOSIS — K219 Gastro-esophageal reflux disease without esophagitis: Secondary | ICD-10-CM | POA: Diagnosis not present

## 2015-09-04 DIAGNOSIS — K449 Diaphragmatic hernia without obstruction or gangrene: Secondary | ICD-10-CM | POA: Diagnosis not present

## 2015-09-04 DIAGNOSIS — E1136 Type 2 diabetes mellitus with diabetic cataract: Secondary | ICD-10-CM | POA: Diagnosis not present

## 2015-09-04 DIAGNOSIS — Z87891 Personal history of nicotine dependence: Secondary | ICD-10-CM | POA: Diagnosis not present

## 2015-09-04 DIAGNOSIS — Z955 Presence of coronary angioplasty implant and graft: Secondary | ICD-10-CM | POA: Diagnosis not present

## 2015-09-04 DIAGNOSIS — Z7901 Long term (current) use of anticoagulants: Secondary | ICD-10-CM | POA: Diagnosis not present

## 2015-09-04 DIAGNOSIS — H2512 Age-related nuclear cataract, left eye: Secondary | ICD-10-CM | POA: Diagnosis not present

## 2015-09-04 DIAGNOSIS — N189 Chronic kidney disease, unspecified: Secondary | ICD-10-CM | POA: Diagnosis not present

## 2015-09-04 DIAGNOSIS — J449 Chronic obstructive pulmonary disease, unspecified: Secondary | ICD-10-CM | POA: Diagnosis not present

## 2015-09-04 DIAGNOSIS — Z888 Allergy status to other drugs, medicaments and biological substances status: Secondary | ICD-10-CM | POA: Diagnosis not present

## 2015-09-10 DIAGNOSIS — R194 Change in bowel habit: Secondary | ICD-10-CM | POA: Diagnosis not present

## 2015-09-10 DIAGNOSIS — R195 Other fecal abnormalities: Secondary | ICD-10-CM | POA: Diagnosis not present

## 2015-09-10 DIAGNOSIS — Z7901 Long term (current) use of anticoagulants: Secondary | ICD-10-CM | POA: Diagnosis not present

## 2015-09-10 DIAGNOSIS — C9000 Multiple myeloma not having achieved remission: Secondary | ICD-10-CM | POA: Diagnosis not present

## 2015-09-11 ENCOUNTER — Encounter: Payer: Self-pay | Admitting: Family

## 2015-09-16 ENCOUNTER — Other Ambulatory Visit: Payer: Self-pay | Admitting: Nurse Practitioner

## 2015-09-16 ENCOUNTER — Other Ambulatory Visit: Payer: Self-pay | Admitting: *Deleted

## 2015-09-16 MED ORDER — LENALIDOMIDE 10 MG PO CAPS: ORAL_CAPSULE | ORAL | 0 refills | Status: DC

## 2015-09-18 ENCOUNTER — Encounter: Payer: Self-pay | Admitting: Sports Medicine

## 2015-09-18 DIAGNOSIS — M5030 Other cervical disc degeneration, unspecified cervical region: Secondary | ICD-10-CM

## 2015-09-18 NOTE — Telephone Encounter (Signed)
Epidural ordered, please schedule with [facility] imaging

## 2015-09-24 ENCOUNTER — Encounter: Payer: Self-pay | Admitting: Family

## 2015-09-24 ENCOUNTER — Ambulatory Visit (HOSPITAL_BASED_OUTPATIENT_CLINIC_OR_DEPARTMENT_OTHER): Payer: Medicare Other

## 2015-09-24 ENCOUNTER — Ambulatory Visit (HOSPITAL_BASED_OUTPATIENT_CLINIC_OR_DEPARTMENT_OTHER): Payer: Medicare Other | Admitting: Family

## 2015-09-24 ENCOUNTER — Other Ambulatory Visit (HOSPITAL_BASED_OUTPATIENT_CLINIC_OR_DEPARTMENT_OTHER): Payer: Medicare Other

## 2015-09-24 VITALS — BP 126/65 | HR 60 | Temp 97.9°F | Resp 16 | Ht 72.0 in | Wt 205.0 lb

## 2015-09-24 DIAGNOSIS — C9001 Multiple myeloma in remission: Secondary | ICD-10-CM

## 2015-09-24 DIAGNOSIS — I82441 Acute embolism and thrombosis of right tibial vein: Secondary | ICD-10-CM | POA: Diagnosis not present

## 2015-09-24 DIAGNOSIS — R194 Change in bowel habit: Secondary | ICD-10-CM

## 2015-09-24 DIAGNOSIS — Z79899 Other long term (current) drug therapy: Secondary | ICD-10-CM | POA: Diagnosis not present

## 2015-09-24 DIAGNOSIS — C9000 Multiple myeloma not having achieved remission: Secondary | ICD-10-CM | POA: Diagnosis not present

## 2015-09-24 DIAGNOSIS — R198 Other specified symptoms and signs involving the digestive system and abdomen: Secondary | ICD-10-CM | POA: Diagnosis not present

## 2015-09-24 DIAGNOSIS — E612 Magnesium deficiency: Secondary | ICD-10-CM

## 2015-09-24 DIAGNOSIS — I82401 Acute embolism and thrombosis of unspecified deep veins of right lower extremity: Secondary | ICD-10-CM

## 2015-09-24 LAB — CMP (CANCER CENTER ONLY)
ALK PHOS: 85 U/L — AB (ref 26–84)
ALT(SGPT): 37 U/L (ref 10–47)
AST: 26 U/L (ref 11–38)
Albumin: 3.2 g/dL — ABNORMAL LOW (ref 3.3–5.5)
BUN: 15 mg/dL (ref 7–22)
CALCIUM: 8.8 mg/dL (ref 8.0–10.3)
CHLORIDE: 101 meq/L (ref 98–108)
CO2: 29 mEq/L (ref 18–33)
Creat: 1.1 mg/dl (ref 0.6–1.2)
GLUCOSE: 135 mg/dL — AB (ref 73–118)
POTASSIUM: 3.3 meq/L (ref 3.3–4.7)
Sodium: 135 mEq/L (ref 128–145)
TOTAL PROTEIN: 6.7 g/dL (ref 6.4–8.1)
Total Bilirubin: 0.9 mg/dl (ref 0.20–1.60)

## 2015-09-24 LAB — CBC WITH DIFFERENTIAL (CANCER CENTER ONLY)
BASO#: 0.1 10*3/uL (ref 0.0–0.2)
BASO%: 1.7 % (ref 0.0–2.0)
EOS ABS: 0.1 10*3/uL (ref 0.0–0.5)
EOS%: 2.1 % (ref 0.0–7.0)
HEMATOCRIT: 38.5 % — AB (ref 38.7–49.9)
HGB: 12.8 g/dL — ABNORMAL LOW (ref 13.0–17.1)
LYMPH#: 1.3 10*3/uL (ref 0.9–3.3)
LYMPH%: 27.4 % (ref 14.0–48.0)
MCH: 28.2 pg (ref 28.0–33.4)
MCHC: 33.2 g/dL (ref 32.0–35.9)
MCV: 85 fL (ref 82–98)
MONO#: 0.6 10*3/uL (ref 0.1–0.9)
MONO%: 12.2 % (ref 0.0–13.0)
NEUT#: 2.7 10*3/uL (ref 1.5–6.5)
NEUT%: 56.6 % (ref 40.0–80.0)
PLATELETS: 202 10*3/uL (ref 145–400)
RBC: 4.54 10*6/uL (ref 4.20–5.70)
RDW: 16.2 % — AB (ref 11.1–15.7)
WBC: 4.8 10*3/uL (ref 4.0–10.0)

## 2015-09-24 LAB — MAGNESIUM: MAGNESIUM: 2.1 mg/dL (ref 1.5–2.5)

## 2015-09-24 MED ORDER — ZOLEDRONIC ACID 4 MG/100ML IV SOLN
4.0000 mg | Freq: Once | INTRAVENOUS | Status: AC
  Administered 2015-09-24: 4 mg via INTRAVENOUS
  Filled 2015-09-24: qty 100

## 2015-09-24 NOTE — Progress Notes (Signed)
Hematology and Oncology Follow Up Visit  [name] [name] [id number] 06-Jan-1941 75 y.o. 09/24/2015   Principle Diagnosis:  IgG kappa myeloma - Trisomy 11 by FISH Acute thromboembolism of the right lower leg  Current Therapy:   Patient s/p cycle 5 of Velcade/Revlimid/Decadron S/P ASCT at [facility] on 04/02/2015 Revlimid 84m po q day (21/28) Zometa 4 mg IV every month Xarelto 10 mg by mouth daily    Interim History: Mr. BMahanis here today with his lovely wife for follow-up and Zometa. He is doing well but has noticed a little rash along his hair line and neck. We will have him try hydrocortisone cream and see if this dries up. There are no visible pustules or vesicles.  No fatigue, fever, chills, n/v, cough, rash, dizziness, SOB, chest pain, abdominal pain or changes in bladder habits. He is using fiber wafers and this has helped with the diarrhea but he is now having occasional constipation. He is still figuring our a regimen with this.  He has had no episodes of bleeding or bruising. He is considering canceling his colonoscopy but will wait until October to decide.  He has chronic neck and lower back pain and receives epidural shots periodically. He is considering surgery to help with this but is unsure if he wants to be that invasive.  No swelling or tenderness in his extremities. The neuropathy in his legs is unchanged.  His cataract surgery went well and he able to see much better.  No lymphadenopathy found on exam.  He has maintained a good appetite and is staying well hydrated. His weight is stable.   Medications:    Medication List       Accurate as of 09/24/15 12:05 PM. Always use your most recent med list.          amLODipine 5 MG tablet Commonly known as:  NORVASC Take 5 mg by mouth daily.   aspirin EC 81 MG tablet Take by mouth.   carvedilol 6.25 MG tablet Commonly known as:  COREG Take 1 tablet (6.25 mg total) by mouth 2 (two) times daily with a meal.     esomeprazole 40 MG capsule Commonly known as:  NEXIUM Take 20 mg by mouth daily at 12 noon.   famciclovir 500 MG tablet Commonly known as:  FAMVIR TAKE 1 TABLET EVERY DAY   gabapentin 600 MG tablet Commonly known as:  NEURONTIN 600 mg in the morning, 1200 mg at night   hydrochlorothiazide 25 MG tablet Commonly known as:  HYDRODIURIL Take 1 tablet (25 mg total) by mouth daily.   isosorbide mononitrate 30 MG 24 hr tablet Commonly known as:  IMDUR Take 1 tablet (30 mg total) by mouth 2 (two) times daily.   lenalidomide 10 MG capsule Commonly known as:  REVLIMID Take daily, days 1-21. Off for 7 days. Repeat every 28 days. AUTH# [id number]  Loratadine 10 MG Caps Take by mouth.   LORazepam 1 MG tablet Commonly known as:  ATIVAN TAKE 1 TABLET AT BEDTIME   montelukast 10 MG tablet Commonly known as:  SINGULAIR Take 1 tablet (10 mg total) by mouth at bedtime.   nitroGLYCERIN 0.4 MG SL tablet Commonly known as:  NITROSTAT Place 0.4 mg under the tongue every 5 (five) minutes as needed. For chest pain   oxyCODONE-acetaminophen 10-325 MG tablet Commonly known as:  PERCOCET Take 1 tablet by mouth daily as needed for pain.   potassium chloride SA 20 MEQ tablet Commonly known as:  K-DUR,KLOR-CON Take 1  tablet (20 mEq total) by mouth 2 (two) times daily.   pyridoxine 100 MG tablet Commonly known as:  B-6 Take by mouth.   RA VITAMIN B-12 TR 1000 MCG Tbcr Generic drug:  Cyanocobalamin Take by mouth.   rivaroxaban 20 MG Tabs tablet Commonly known as:  XARELTO Take 1 tablet (20 mg total) by mouth daily with supper.   SUPREP BOWEL PREP KIT 17.5-3.13-1.6 GM/180ML Soln Generic drug:  Na Sulfate-K Sulfate-Mg Sulf   Vitamin D (Ergocalciferol) 50000 units Caps capsule Commonly known as:  DRISDOL Take by mouth.       Allergies:  Allergies  Allergen Reactions  . Budesonide-Formoterol Fumarate Itching  . Codeine Itching  . Hydrocodone-Acetaminophen Itching  . Mushroom  Extract Complex Nausea And Vomiting    Only shitake mushroom  causes this reaction.    Past Medical History, Surgical history, Social history, and Family History were reviewed and updated.  Review of Systems: All other 10 point review of systems is negative.   Physical Exam:  height is 6' (1.829 m) and weight is 205 lb (93 kg). His oral temperature is 97.9 F (36.6 C). His blood pressure is 126/65 and his pulse is 60. His respiration is 16.   Wt Readings from Last 3 Encounters:  09/24/15 205 lb (93 kg)  08/20/15 202 lb (91.6 kg)  07/17/15 208 lb (94.3 kg)    Ocular: Sclerae unicteric, pupils equal, round and reactive to light Ear-nose-throat: Oropharynx clear, dentition fair Lymphatic: No cervical supraclavicular or axillary adenopathy Lungs no rales or rhonchi, good excursion bilaterally Heart regular rate and rhythm, no murmur appreciated Abd soft, nontender, positive bowel sounds, no liver or spleen tip palpated on exam, no fluid wave MSK no focal spinal tenderness, no joint edema Neuro: non-focal, well-oriented, appropriate affect Breasts: Deferred  Lab Results  Component Value Date   WBC 4.8 09/24/2015   HGB 12.8 (L) 09/24/2015   HCT 38.5 (L) 09/24/2015   MCV 85 09/24/2015   PLT 202 09/24/2015   Lab Results  Component Value Date   FERRITIN 53 02/19/2013   IRON 45 02/19/2013   TIBC 320 02/19/2013   UIBC 275 02/19/2013   IRONPCTSAT 14 (L) 02/19/2013   Lab Results  Component Value Date   RBC 4.54 09/24/2015   Lab Results  Component Value Date   KPAFRELGTCHN 1.36 02/11/2015   LAMBDASER 1.09 02/11/2015   KAPLAMBRATIO 1.14 08/20/2015   Lab Results  Component Value Date   IGGSERUM 854 08/20/2015   IGA 64 (L) 02/11/2015   IGMSERUM 70 08/20/2015   Lab Results  Component Value Date   TOTALPROTELP 6.0 (L) 02/11/2015   ALBUMINELP 3.2 (L) 02/11/2015   A1GS 0.4 (H) 02/11/2015   A2GS 0.9 02/11/2015   BETS 0.5 02/11/2015   BETA2SER 0.3 02/11/2015   GAMS 0.6  (L) 02/11/2015   MSPIKE Not Observed 06/05/2015   SPEI * 02/11/2015     Chemistry      Component Value Date/Time   NA 136 08/20/2015 1127   NA 141 06/05/2015 1036   K 3.2 (L) 08/20/2015 1127   K 2.9 (LL) 06/05/2015 1036   CL 103 08/20/2015 1127   CO2 25 08/20/2015 1127   CO2 27 06/05/2015 1036   BUN 16 08/20/2015 1127   BUN 13.1 06/05/2015 1036   CREATININE 0.7 08/20/2015 1127   CREATININE 1.0 06/05/2015 1036      Component Value Date/Time   CALCIUM 9.0 08/20/2015 1127   CALCIUM 9.4 06/05/2015 1036   ALKPHOS 88 (  H) 08/20/2015 1127   ALKPHOS 94 06/05/2015 1036   AST 21 08/20/2015 1127   AST 22 06/05/2015 1036   ALT 30 08/20/2015 1127   ALT 18 06/05/2015 1036   BILITOT 0.70 08/20/2015 1127   BILITOT 0.59 06/05/2015 1036     Impression and Plan: Mr. Windmiller is a 75 yo male with history of IgG kappa myeloma. He has completed 5 cycles of chemotherapy and is doing well now on Revlimid. He has no complaints at this time.  July M-spike not detected and light chains stable. His myeloma studies today are pending.  We will proceed with his Zometa infusion today as planned.  He will continue on his same regimen with Revlimid.  We will plan to see him back in 1 months for repeat lab work and follow-up.  He will contact us with any questions or concerns. We can certainly see her sooner if need be.   Eliezer Bottom, NP 8/10/201712:05 PM

## 2015-09-24 NOTE — Patient Instructions (Signed)
[place] Discharge Instructions for Patients Receiving Chemotherapy  Today you received the following  agents Zometa.     If you develop nausea and vomiting that is not controlled by your nausea medication, call the clinic.   BELOW ARE SYMPTOMS THAT SHOULD BE REPORTED IMMEDIATELY:  *FEVER GREATER THAN 100.5 F  *CHILLS WITH OR WITHOUT FEVER  NAUSEA AND VOMITING THAT IS NOT CONTROLLED WITH YOUR NAUSEA MEDICATION  *UNUSUAL SHORTNESS OF BREATH  *UNUSUAL BRUISING OR BLEEDING  TENDERNESS IN MOUTH AND THROAT WITH OR WITHOUT PRESENCE OF ULCERS  *URINARY PROBLEMS  *BOWEL PROBLEMS  UNUSUAL RASH Items with * indicate a potential emergency and should be followed up as soon as possible.  Feel free to call the clinic you have any questions or concerns. The clinic phone number is [phone].  Please show the [place] at check-in to the Emergency Department and triage nurse.

## 2015-09-25 LAB — HM DIABETES EYE EXAM

## 2015-09-25 LAB — KAPPA/LAMBDA LIGHT CHAINS
IG KAPPA FREE LIGHT CHAIN: 19 mg/L (ref 3.3–19.4)
IG LAMBDA FREE LIGHT CHAIN: 14.8 mg/L (ref 5.7–26.3)
Kappa/Lambda FluidC Ratio: 1.28 (ref 0.26–1.65)

## 2015-09-28 ENCOUNTER — Ambulatory Visit
Admission: RE | Admit: 2015-09-28 | Discharge: 2015-09-28 | Disposition: A | Discharge status: Home / Self Care | Payer: Medicare Other | Source: Ambulatory Visit | Attending: Sports Medicine | Admitting: Sports Medicine

## 2015-09-28 VITALS — BP 134/72 | HR 63

## 2015-09-28 DIAGNOSIS — M5030 Other cervical disc degeneration, unspecified cervical region: Secondary | ICD-10-CM

## 2015-09-28 DIAGNOSIS — M47812 Spondylosis without myelopathy or radiculopathy, cervical region: Secondary | ICD-10-CM | POA: Diagnosis not present

## 2015-09-28 LAB — MULTIPLE MYELOMA PANEL, SERUM
ALBUMIN/GLOB SERPL: 1.2 (ref 0.7–1.7)
ALPHA 1: 0.2 g/dL (ref 0.0–0.4)
ALPHA2 GLOB SERPL ELPH-MCNC: 0.8 g/dL (ref 0.4–1.0)
Albumin SerPl Elph-Mcnc: 3.4 g/dL (ref 2.9–4.4)
B-GLOBULIN SERPL ELPH-MCNC: 1.1 g/dL (ref 0.7–1.3)
Gamma Glob SerPl Elph-Mcnc: 0.9 g/dL (ref 0.4–1.8)
Globulin, Total: 3 g/dL (ref 2.2–3.9)
IGG (IMMUNOGLOBIN G), SERUM: 865 mg/dL (ref 700–1600)
IgA, Qn, Serum: 78 mg/dL (ref 61–437)
IgM, Qn, Serum: 29 mg/dL (ref 15–143)
TOTAL PROTEIN: 6.4 g/dL (ref 6.0–8.5)

## 2015-09-28 MED ORDER — TRIAMCINOLONE ACETONIDE 40 MG/ML IJ SUSP (RADIOLOGY)
60.0000 mg | Freq: Once | INTRAMUSCULAR | Status: AC
  Administered 2015-09-28: 60 mg via EPIDURAL

## 2015-09-28 MED ORDER — IOPAMIDOL (ISOVUE-M 300) INJECTION 61%
1.0000 mL | Freq: Once | INTRAMUSCULAR | Status: AC | PRN
  Administered 2015-09-28: 1 mL via EPIDURAL

## 2015-09-28 NOTE — Discharge Instructions (Signed)

## 2015-09-29 ENCOUNTER — Other Ambulatory Visit: Payer: Self-pay | Admitting: Sports Medicine

## 2015-09-29 DIAGNOSIS — I82441 Acute embolism and thrombosis of right tibial vein: Secondary | ICD-10-CM

## 2015-09-29 DIAGNOSIS — C9001 Multiple myeloma in remission: Secondary | ICD-10-CM

## 2015-09-29 DIAGNOSIS — R194 Change in bowel habit: Secondary | ICD-10-CM

## 2015-09-29 DIAGNOSIS — I10 Essential (primary) hypertension: Secondary | ICD-10-CM

## 2015-10-07 DIAGNOSIS — C9001 Multiple myeloma in remission: Secondary | ICD-10-CM | POA: Diagnosis not present

## 2015-10-07 DIAGNOSIS — Z13820 Encounter for screening for osteoporosis: Secondary | ICD-10-CM | POA: Diagnosis not present

## 2015-10-07 DIAGNOSIS — Z23 Encounter for immunization: Secondary | ICD-10-CM | POA: Diagnosis not present

## 2015-10-07 DIAGNOSIS — Z9189 Other specified personal risk factors, not elsewhere classified: Secondary | ICD-10-CM | POA: Diagnosis not present

## 2015-10-09 ENCOUNTER — Other Ambulatory Visit: Payer: Self-pay

## 2015-10-09 MED ORDER — LENALIDOMIDE 10 MG PO CAPS: ORAL_CAPSULE | ORAL | 0 refills | Status: DC

## 2015-10-19 ENCOUNTER — Other Ambulatory Visit: Payer: Self-pay | Admitting: Sports Medicine

## 2015-10-22 ENCOUNTER — Ambulatory Visit (HOSPITAL_BASED_OUTPATIENT_CLINIC_OR_DEPARTMENT_OTHER): Payer: Medicare Other

## 2015-10-22 ENCOUNTER — Encounter: Payer: Self-pay | Admitting: Family

## 2015-10-22 ENCOUNTER — Ambulatory Visit (HOSPITAL_BASED_OUTPATIENT_CLINIC_OR_DEPARTMENT_OTHER): Payer: Medicare Other | Admitting: Family

## 2015-10-22 ENCOUNTER — Ambulatory Visit (HOSPITAL_BASED_OUTPATIENT_CLINIC_OR_DEPARTMENT_OTHER)
Admission: RE | Admit: 2015-10-22 | Discharge: 2015-10-22 | Disposition: A | Discharge status: Home / Self Care | Payer: Medicare Other | Source: Ambulatory Visit | Attending: Family | Admitting: Family

## 2015-10-22 ENCOUNTER — Other Ambulatory Visit (HOSPITAL_BASED_OUTPATIENT_CLINIC_OR_DEPARTMENT_OTHER): Payer: Medicare Other

## 2015-10-22 VITALS — BP 136/64 | HR 63 | Temp 98.0°F | Resp 18 | Ht 72.0 in | Wt 209.0 lb

## 2015-10-22 DIAGNOSIS — R05 Cough: Secondary | ICD-10-CM

## 2015-10-22 DIAGNOSIS — L814 Other melanin hyperpigmentation: Secondary | ICD-10-CM | POA: Diagnosis not present

## 2015-10-22 DIAGNOSIS — D225 Melanocytic nevi of trunk: Secondary | ICD-10-CM | POA: Diagnosis not present

## 2015-10-22 DIAGNOSIS — Z85828 Personal history of other malignant neoplasm of skin: Secondary | ICD-10-CM | POA: Diagnosis not present

## 2015-10-22 DIAGNOSIS — L821 Other seborrheic keratosis: Secondary | ICD-10-CM | POA: Diagnosis not present

## 2015-10-22 DIAGNOSIS — C9000 Multiple myeloma not having achieved remission: Secondary | ICD-10-CM

## 2015-10-22 DIAGNOSIS — C9001 Multiple myeloma in remission: Secondary | ICD-10-CM | POA: Insufficient documentation

## 2015-10-22 DIAGNOSIS — L218 Other seborrheic dermatitis: Secondary | ICD-10-CM | POA: Diagnosis not present

## 2015-10-22 DIAGNOSIS — J40 Bronchitis, not specified as acute or chronic: Secondary | ICD-10-CM

## 2015-10-22 DIAGNOSIS — R0602 Shortness of breath: Secondary | ICD-10-CM

## 2015-10-22 DIAGNOSIS — L579 Skin changes due to chronic exposure to nonionizing radiation, unspecified: Secondary | ICD-10-CM | POA: Diagnosis not present

## 2015-10-22 DIAGNOSIS — E612 Magnesium deficiency: Secondary | ICD-10-CM

## 2015-10-22 DIAGNOSIS — R059 Cough, unspecified: Secondary | ICD-10-CM

## 2015-10-22 DIAGNOSIS — R42 Dizziness and giddiness: Secondary | ICD-10-CM | POA: Diagnosis not present

## 2015-10-22 LAB — CMP (CANCER CENTER ONLY)
ALK PHOS: 112 U/L — AB (ref 26–84)
ALT: 53 U/L — AB (ref 10–47)
AST: 25 U/L (ref 11–38)
Albumin: 2.9 g/dL — ABNORMAL LOW (ref 3.3–5.5)
BUN: 11 mg/dL (ref 7–22)
CALCIUM: 8.5 mg/dL (ref 8.0–10.3)
CHLORIDE: 106 meq/L (ref 98–108)
CO2: 27 mEq/L (ref 18–33)
Creat: 1 mg/dl (ref 0.6–1.2)
GLUCOSE: 126 mg/dL — AB (ref 73–118)
POTASSIUM: 3.3 meq/L (ref 3.3–4.7)
Sodium: 135 mEq/L (ref 128–145)
Total Bilirubin: 0.9 mg/dl (ref 0.20–1.60)
Total Protein: 6.5 g/dL (ref 6.4–8.1)

## 2015-10-22 LAB — CBC WITH DIFFERENTIAL (CANCER CENTER ONLY)
BASO#: 0.1 10*3/uL (ref 0.0–0.2)
BASO%: 1.2 % (ref 0.0–2.0)
EOS ABS: 0.4 10*3/uL (ref 0.0–0.5)
EOS%: 7.9 % — ABNORMAL HIGH (ref 0.0–7.0)
HEMATOCRIT: 36.1 % — AB (ref 38.7–49.9)
HGB: 12 g/dL — ABNORMAL LOW (ref 13.0–17.1)
LYMPH#: 1.4 10*3/uL (ref 0.9–3.3)
LYMPH%: 29.1 % (ref 14.0–48.0)
MCH: 28.6 pg (ref 28.0–33.4)
MCHC: 33.2 g/dL (ref 32.0–35.9)
MCV: 86 fL (ref 82–98)
MONO#: 0.7 10*3/uL (ref 0.1–0.9)
MONO%: 14.2 % — ABNORMAL HIGH (ref 0.0–13.0)
NEUT#: 2.4 10*3/uL (ref 1.5–6.5)
NEUT%: 47.6 % (ref 40.0–80.0)
PLATELETS: 206 10*3/uL (ref 145–400)
RBC: 4.2 10*6/uL (ref 4.20–5.70)
RDW: 18.2 % — AB (ref 11.1–15.7)
WBC: 4.9 10*3/uL (ref 4.0–10.0)

## 2015-10-22 MED ORDER — SODIUM CHLORIDE 0.9 % IV SOLN
INTRAVENOUS | Status: DC
  Administered 2015-10-22: 11:00:00 via INTRAVENOUS

## 2015-10-22 MED ORDER — METHYLPREDNISOLONE SODIUM SUCC 125 MG IJ SOLR
INTRAMUSCULAR | Status: AC
  Filled 2015-10-22: qty 2

## 2015-10-22 MED ORDER — ZOLEDRONIC ACID 4 MG/100ML IV SOLN
4.0000 mg | Freq: Once | INTRAVENOUS | Status: AC
  Administered 2015-10-22: 4 mg via INTRAVENOUS
  Filled 2015-10-22: qty 100

## 2015-10-22 MED ORDER — AZITHROMYCIN 250 MG PO TABS: ORAL_TABLET | ORAL | 0 refills | Status: DC

## 2015-10-22 MED ORDER — METHYLPREDNISOLONE SODIUM SUCC 125 MG IJ SOLR
125.0000 mg | Freq: Once | INTRAMUSCULAR | Status: AC
  Administered 2015-10-22: 125 mg via INTRAVENOUS

## 2015-10-22 NOTE — Patient Instructions (Signed)
[place] Discharge Instructions for Patients Receiving Chemotherapy  Today you received the following  agents Zometa.     If you develop nausea and vomiting that is not controlled by your nausea medication, call the clinic.   BELOW ARE SYMPTOMS THAT SHOULD BE REPORTED IMMEDIATELY:  *FEVER GREATER THAN 100.5 F  *CHILLS WITH OR WITHOUT FEVER  NAUSEA AND VOMITING THAT IS NOT CONTROLLED WITH YOUR NAUSEA MEDICATION  *UNUSUAL SHORTNESS OF BREATH  *UNUSUAL BRUISING OR BLEEDING  TENDERNESS IN MOUTH AND THROAT WITH OR WITHOUT PRESENCE OF ULCERS  *URINARY PROBLEMS  *BOWEL PROBLEMS  UNUSUAL RASH Items with * indicate a potential emergency and should be followed up as soon as possible.  Feel free to call the clinic you have any questions or concerns. The clinic phone number is [phone].  Please show the [place] at check-in to the Emergency Department and triage nurse.

## 2015-10-22 NOTE — Progress Notes (Signed)
Hematology and Oncology Follow Up Visit  [name] [name] [id number] [birth year] 75 y.o. 10/22/2015   Principle Diagnosis:  IgG kappa myeloma - Trisomy 11 by FISH Acute thromboembolism of the right lower leg  Current Therapy:   Patient s/p cycle 5 of Velcade/Revlimid/Decadron S/P ASCT at [facility] on 04/02/2015 Revlimid 46m po q day (21/28) Zometa 4 mg IV every month Xarelto 10 mg by mouth daily    Interim History: Mr. BCarringtonis here today for follow-up. He is feeling fatigued, dizzy and SOB with congestion. He has a dry cough at this time but did cough up green sputum several days ago.  We will go ahead and get a chest x-ray today. He follows up with pulmonology on September 20th.  He will also follow-up dermatology later today regarding the small bumps on his head forehead and scalp.  No fever, chills, n/v, rash, chest pain or changes in bladder habits. He BM's are regular now, no diarrhea. He has had some abdominal cramps after eating that come and go with the revlimid.  He did decide to cancel his colonoscopy since he has had no other episodes of rectal bleeding.  He has had no episodes of bleeding or bruising.  He has chronic neck and lower back pain and receives epidural shots periodically.  No swelling or tenderness in his extremities. The neuropathy in his legs is unchanged.  No lymphadenopathy found on exam.  He has maintained a good appetite and is making sure to stay hydrated. His weight is stable.   Medications:    Medication List       Accurate as of 10/22/15 10:00 AM. Always use your most recent med list.          amLODipine 5 MG tablet Commonly known as:  NORVASC TAKE 1 TABLET EVERY DAY   aspirin EC 81 MG tablet Take by mouth.   carvedilol 6.25 MG tablet Commonly known as:  COREG TAKE 1 TABLET TWICE DAILY WITH A MEAL   esomeprazole 40 MG capsule Commonly known as:  NEXIUM Take 20 mg by mouth daily at 12 noon.   famciclovir 500 MG tablet Commonly known as:   FAMVIR TAKE 1 TABLET EVERY DAY   gabapentin 600 MG tablet Commonly known as:  NEURONTIN 600 mg in the morning, 1200 mg at night   hydrochlorothiazide 25 MG tablet Commonly known as:  HYDRODIURIL Take 1 tablet (25 mg total) by mouth daily.   hydrochlorothiazide 12.5 MG capsule Commonly known as:  MICROZIDE TAKE 1 CAPSULE EVERY DAY   isosorbide mononitrate 30 MG 24 hr tablet Commonly known as:  IMDUR TAKE 1 TABLET TWICE DAILY   lenalidomide 10 MG capsule Commonly known as:  REVLIMID Take daily, days 1-21. Off for 7 days. Repeat every 28 days. AUTH# [id number]  Loratadine 10 MG Caps Take by mouth.   LORazepam 1 MG tablet Commonly known as:  ATIVAN TAKE 1 TABLET AT BEDTIME   montelukast 10 MG tablet Commonly known as:  SINGULAIR TAKE 1 TABLET AT BEDTIME   nitroGLYCERIN 0.4 MG SL tablet Commonly known as:  NITROSTAT Place 0.4 mg under the tongue every 5 (five) minutes as needed. For chest pain   oxyCODONE-acetaminophen 10-325 MG tablet Commonly known as:  PERCOCET Take 1 tablet by mouth daily as needed for pain.   potassium chloride SA 20 MEQ tablet Commonly known as:  K-DUR,KLOR-CON Take 1 tablet (20 mEq total) by mouth 2 (two) times daily.   pyridoxine 100 MG tablet Commonly known as:  B-6 Take by mouth.   RA VITAMIN B-12 TR 1000 MCG Tbcr Generic drug:  Cyanocobalamin Take by mouth.   rivaroxaban 20 MG Tabs tablet Commonly known as:  XARELTO Take 1 tablet (20 mg total) by mouth daily with supper.   SUPREP BOWEL PREP KIT 17.5-3.13-1.6 GM/180ML Soln Generic drug:  Na Sulfate-K Sulfate-Mg Sulf   Vitamin D (Ergocalciferol) 50000 units Caps capsule Commonly known as:  DRISDOL Take by mouth.       Allergies:  Allergies  Allergen Reactions  . Codeine Itching  . Hydrocodone-Acetaminophen Itching  . Mushroom Extract Complex Nausea And Vomiting    Only shitake mushroom  causes this reaction.  . Symbicort [Budesonide-Formoterol Fumarate] Itching    Past  Medical History, Surgical history, Social history, and Family History were reviewed and updated.  Review of Systems: All other 10 point review of systems is negative.   Physical Exam:  vitals were not taken for this visit.  Wt Readings from Last 3 Encounters:  09/24/15 205 lb (93 kg)  08/20/15 202 lb (91.6 kg)  07/17/15 208 lb (94.3 kg)    Ocular: Sclerae unicteric, pupils equal, round and reactive to light Ear-nose-throat: Oropharynx clear, dentition fair Lymphatic: No cervical supraclavicular or axillary adenopathy Lungs no rales or rhonchi, good excursion bilaterally Heart regular rate and rhythm, no murmur appreciated Abd soft, nontender, positive bowel sounds, no liver or spleen tip palpated on exam, no fluid wave MSK no focal spinal tenderness, no joint edema Neuro: non-focal, well-oriented, appropriate affect Breasts: Deferred  Lab Results  Component Value Date   WBC 4.9 10/22/2015   HGB 12.0 (L) 10/22/2015   HCT 36.1 (L) 10/22/2015   MCV 86 10/22/2015   PLT 206 10/22/2015   Lab Results  Component Value Date   FERRITIN 53 02/19/2013   IRON 45 02/19/2013   TIBC 320 02/19/2013   UIBC 275 02/19/2013   IRONPCTSAT 14 (L) 02/19/2013   Lab Results  Component Value Date   RBC 4.20 10/22/2015   Lab Results  Component Value Date   KPAFRELGTCHN 1.36 02/11/2015   LAMBDASER 1.09 02/11/2015   KAPLAMBRATIO 1.28 09/24/2015   Lab Results  Component Value Date   IGGSERUM 865 09/24/2015   IGA 64 (L) 02/11/2015   IGMSERUM 29 09/24/2015   Lab Results  Component Value Date   TOTALPROTELP 6.0 (L) 02/11/2015   ALBUMINELP 3.2 (L) 02/11/2015   A1GS 0.4 (H) 02/11/2015   A2GS 0.9 02/11/2015   BETS 0.5 02/11/2015   BETA2SER 0.3 02/11/2015   GAMS 0.6 (L) 02/11/2015   MSPIKE Not Observed 06/05/2015   SPEI * 02/11/2015     Chemistry      Component Value Date/Time   NA 135 09/24/2015 1141   NA 141 06/05/2015 1036   K 3.3 09/24/2015 1141   K 2.9 (LL) 06/05/2015 1036    CL 101 09/24/2015 1141   CO2 29 09/24/2015 1141   CO2 27 06/05/2015 1036   BUN 15 09/24/2015 1141   BUN 13.1 06/05/2015 1036   CREATININE 1.1 09/24/2015 1141   CREATININE 1.0 06/05/2015 1036      Component Value Date/Time   CALCIUM 8.8 09/24/2015 1141   CALCIUM 9.4 06/05/2015 1036   ALKPHOS 85 (H) 09/24/2015 1141   ALKPHOS 94 06/05/2015 1036   AST 26 09/24/2015 1141   AST 22 06/05/2015 1036   ALT 37 09/24/2015 1141   ALT 18 06/05/2015 1036   BILITOT 0.90 09/24/2015 1141   BILITOT 0.59 06/05/2015 1036  Impression and Plan: Mr. Dufault is a 75 yo male with history of IgG kappa myeloma. He has completed 5 cycles of chemotherapy and is doing well now on Revlimid. He continues to be followed by [facility] and was last seen in August. They will follow-up with him again in 6 months and he will get his next set of vaccines.  He has had some mild hand cramps and white bumps on the scalp and forehead. This could possibly be due to the Revlimid. He is able to tolerate these issues at this time.  He has had fatigue, SOB, dizziness and a cough. No fevers. His chest x-ray showed chronic interstitial coarsening and bronchial wall thickening. We will give him IV solumedrol today while he is here and then have him start a Z-pack. In August, M-spike not detected and light chains stable. His myeloma studies today are pending.  We will proceed with his Zometa infusion today as planned.  He will continue on his same regimen with Revlimid.  We will plan to see him back in 1 months for repeat lab work and follow-up.  He will contact us with any questions or concerns. We can certainly see her sooner if need be.   Eliezer Bottom, NP 9/7/201710:00 AM

## 2015-10-23 LAB — KAPPA/LAMBDA LIGHT CHAINS
IG LAMBDA FREE LIGHT CHAIN: 17.4 mg/L (ref 5.7–26.3)
Ig Kappa Free Light Chain: 21.6 mg/L — ABNORMAL HIGH (ref 3.3–19.4)
KAPPA/LAMBDA FLC RATIO: 1.24 (ref 0.26–1.65)

## 2015-10-27 LAB — MULTIPLE MYELOMA PANEL, SERUM
ALBUMIN SERPL ELPH-MCNC: 3.3 g/dL (ref 2.9–4.4)
ALBUMIN/GLOB SERPL: 1.2 (ref 0.7–1.7)
ALPHA 1: 0.2 g/dL (ref 0.0–0.4)
ALPHA2 GLOB SERPL ELPH-MCNC: 0.9 g/dL (ref 0.4–1.0)
B-Globulin SerPl Elph-Mcnc: 1 g/dL (ref 0.7–1.3)
Gamma Glob SerPl Elph-Mcnc: 0.9 g/dL (ref 0.4–1.8)
Globulin, Total: 3 g/dL (ref 2.2–3.9)
IGG (IMMUNOGLOBIN G), SERUM: 872 mg/dL (ref 700–1600)
IGM (IMMUNOGLOBIN M), SRM: 25 mg/dL (ref 15–143)
IgA, Qn, Serum: 96 mg/dL (ref 61–437)
TOTAL PROTEIN: 6.3 g/dL (ref 6.0–8.5)

## 2015-11-02 ENCOUNTER — Other Ambulatory Visit: Payer: Self-pay | Admitting: Nurse Practitioner

## 2015-11-02 MED ORDER — LENALIDOMIDE 10 MG PO CAPS: ORAL_CAPSULE | ORAL | 0 refills | Status: DC

## 2015-11-03 DIAGNOSIS — G4733 Obstructive sleep apnea (adult) (pediatric): Secondary | ICD-10-CM | POA: Diagnosis not present

## 2015-11-04 DIAGNOSIS — G4733 Obstructive sleep apnea (adult) (pediatric): Secondary | ICD-10-CM | POA: Diagnosis not present

## 2015-11-04 DIAGNOSIS — R918 Other nonspecific abnormal finding of lung field: Secondary | ICD-10-CM | POA: Diagnosis not present

## 2015-11-04 DIAGNOSIS — I2699 Other pulmonary embolism without acute cor pulmonale: Secondary | ICD-10-CM | POA: Diagnosis not present

## 2015-11-04 DIAGNOSIS — R0602 Shortness of breath: Secondary | ICD-10-CM | POA: Diagnosis not present

## 2015-11-04 DIAGNOSIS — I209 Angina pectoris, unspecified: Secondary | ICD-10-CM | POA: Diagnosis not present

## 2015-11-05 ENCOUNTER — Other Ambulatory Visit: Payer: Self-pay | Admitting: *Deleted

## 2015-11-05 MED ORDER — LENALIDOMIDE 10 MG PO CAPS: ORAL_CAPSULE | ORAL | 0 refills | Status: DC

## 2015-11-20 ENCOUNTER — Ambulatory Visit (HOSPITAL_BASED_OUTPATIENT_CLINIC_OR_DEPARTMENT_OTHER): Payer: Medicare Other

## 2015-11-20 ENCOUNTER — Other Ambulatory Visit (HOSPITAL_BASED_OUTPATIENT_CLINIC_OR_DEPARTMENT_OTHER): Payer: Medicare Other

## 2015-11-20 ENCOUNTER — Ambulatory Visit (HOSPITAL_BASED_OUTPATIENT_CLINIC_OR_DEPARTMENT_OTHER): Payer: Medicare Other | Admitting: Hematology & Oncology

## 2015-11-20 VITALS — BP 118/68 | HR 57 | Temp 97.4°F | Resp 18 | Wt 209.0 lb

## 2015-11-20 DIAGNOSIS — R05 Cough: Secondary | ICD-10-CM

## 2015-11-20 DIAGNOSIS — R0602 Shortness of breath: Secondary | ICD-10-CM | POA: Diagnosis not present

## 2015-11-20 DIAGNOSIS — J40 Bronchitis, not specified as acute or chronic: Secondary | ICD-10-CM | POA: Diagnosis not present

## 2015-11-20 DIAGNOSIS — R059 Cough, unspecified: Secondary | ICD-10-CM

## 2015-11-20 DIAGNOSIS — N522 Drug-induced erectile dysfunction: Secondary | ICD-10-CM

## 2015-11-20 DIAGNOSIS — C9001 Multiple myeloma in remission: Secondary | ICD-10-CM | POA: Diagnosis not present

## 2015-11-20 DIAGNOSIS — C9000 Multiple myeloma not having achieved remission: Secondary | ICD-10-CM

## 2015-11-20 LAB — CMP (CANCER CENTER ONLY)
ALBUMIN: 3.1 g/dL — AB (ref 3.3–5.5)
ALK PHOS: 95 U/L — AB (ref 26–84)
ALT: 42 U/L (ref 10–47)
AST: 31 U/L (ref 11–38)
BILIRUBIN TOTAL: 0.8 mg/dL (ref 0.20–1.60)
BUN, Bld: 7 mg/dL (ref 7–22)
CALCIUM: 8.3 mg/dL (ref 8.0–10.3)
CO2: 26 mEq/L (ref 18–33)
Chloride: 103 mEq/L (ref 98–108)
Creat: 1.2 mg/dl (ref 0.6–1.2)
Glucose, Bld: 149 mg/dL — ABNORMAL HIGH (ref 73–118)
POTASSIUM: 3.1 meq/L — AB (ref 3.3–4.7)
Sodium: 137 mEq/L (ref 128–145)
TOTAL PROTEIN: 6.4 g/dL (ref 6.4–8.1)

## 2015-11-20 LAB — CBC WITH DIFFERENTIAL (CANCER CENTER ONLY)
BASO#: 0.1 10*3/uL (ref 0.0–0.2)
BASO%: 1.6 % (ref 0.0–2.0)
EOS%: 4.9 % (ref 0.0–7.0)
Eosinophils Absolute: 0.2 10*3/uL (ref 0.0–0.5)
HEMATOCRIT: 35.8 % — AB (ref 38.7–49.9)
HEMOGLOBIN: 11.9 g/dL — AB (ref 13.0–17.1)
LYMPH#: 1.3 10*3/uL (ref 0.9–3.3)
LYMPH%: 34.2 % (ref 14.0–48.0)
MCH: 29.1 pg (ref 28.0–33.4)
MCHC: 33.2 g/dL (ref 32.0–35.9)
MCV: 88 fL (ref 82–98)
MONO#: 0.6 10*3/uL (ref 0.1–0.9)
MONO%: 16.1 % — ABNORMAL HIGH (ref 0.0–13.0)
NEUT%: 43.2 % (ref 40.0–80.0)
NEUTROS ABS: 1.7 10*3/uL (ref 1.5–6.5)
Platelets: 200 10*3/uL (ref 145–400)
RBC: 4.09 10*6/uL — ABNORMAL LOW (ref 4.20–5.70)
RDW: 18.1 % — AB (ref 11.1–15.7)
WBC: 3.9 10*3/uL — ABNORMAL LOW (ref 4.0–10.0)

## 2015-11-20 MED ORDER — ZOLEDRONIC ACID 4 MG/100ML IV SOLN
4.0000 mg | Freq: Once | INTRAVENOUS | Status: AC
  Administered 2015-11-20: 4 mg via INTRAVENOUS
  Filled 2015-11-20: qty 100

## 2015-11-20 NOTE — Patient Instructions (Signed)

## 2015-11-20 NOTE — Progress Notes (Signed)
Hematology and Oncology Follow Up Visit  [name] [name] [id number] Aug 06, 1940 75 y.o. 11/20/2015   Principle Diagnosis:  IgG kappa myeloma - Trisomy 11 by FISH Acute thromboembolism of the right lower leg  Current Therapy:   Patient s/p cycle 5 of Velcade/Revlimid/Decadron S/P ASCT at [facility] on 04/02/2015 Revlimid 10mg  po q day (21/28) Zometa 4 mg IV every 2 months Xarelto 10 mg by mouth daily    Interim History: [name] [name] is here today for follow-up. He is feeling a little bit better. He got over the bronchitis.  He is worried about some weight gain area and he says he gained about 15 pounds in 4 months. He thinks this might be from the Neurontin. I will check a testosterone level on him.   His wife has breast cancer. She is doing okay. Thankfully, she will not need chemotherapy. Unfortunately, her mother down in [place] has lung cancer. His wife has been going down there to try to help her out.  His myeloma is doing well area and his last M spike was not observable.  He is working out. He is wife are does exercises together.  He's had no change in bowel or bladder habits. He's had no cough. He's had no rashes. He's had no nausea or vomiting.  Overall, his performance status is ECOG is 0.  His birthday is coming up this weekend. I'm sure that he will celebrated and have a great time.    Medications:    Medication List       Accurate as of 11/20/15  9:23 AM. Always use your most recent med list.          amLODipine 5 MG tablet Commonly known as:  NORVASC TAKE 1 TABLET EVERY DAY   aspirin EC 81 MG tablet Take by mouth.   azithromycin 250 MG tablet Commonly known as:  ZITHROMAX Z-PAK Take as directed on package.   carvedilol 6.25 MG tablet Commonly known as:  COREG TAKE 1 TABLET TWICE DAILY WITH A MEAL   esomeprazole 40 MG capsule Commonly known as:  NEXIUM Take 20 mg by mouth daily at 12 noon.   famciclovir 500 MG tablet Commonly known as:  FAMVIR TAKE  1 TABLET EVERY DAY   gabapentin 600 MG tablet Commonly known as:  NEURONTIN 600 mg in the morning, 1200 mg at night   hydrochlorothiazide 12.5 MG capsule Commonly known as:  MICROZIDE TAKE 1 CAPSULE EVERY DAY   isosorbide mononitrate 30 MG 24 hr tablet Commonly known as:  IMDUR TAKE 1 TABLET TWICE DAILY   lenalidomide 10 MG capsule Commonly known as:  REVLIMID Take daily, days 1-21. Off for 7 days. Repeat every 28 days. AUTH# [id number]   Loratadine 10 MG Caps Take by mouth.   LORazepam 1 MG tablet Commonly known as:  ATIVAN TAKE 1 TABLET AT BEDTIME   montelukast 10 MG tablet Commonly known as:  SINGULAIR TAKE 1 TABLET AT BEDTIME   nitroGLYCERIN 0.4 MG SL tablet Commonly known as:  NITROSTAT Place 0.4 mg under the tongue every 5 (five) minutes as needed. For chest pain   oxyCODONE-acetaminophen 10-325 MG tablet Commonly known as:  PERCOCET Take 1 tablet by mouth daily as needed for pain.   potassium chloride SA 20 MEQ tablet Commonly known as:  K-DUR,KLOR-CON Take 1 tablet (20 mEq total) by mouth 2 (two) times daily.   pyridoxine 100 MG tablet Commonly known as:  B-6 Take by mouth.   RA VITAMIN B-12 TR 1000 MCG Tbcr  Generic drug:  Cyanocobalamin Take by mouth.   rivaroxaban 20 MG Tabs tablet Commonly known as:  XARELTO Take 1 tablet (20 mg total) by mouth daily with supper.   Vitamin D (Ergocalciferol) 50000 units Caps capsule Commonly known as:  DRISDOL Take by mouth.       Allergies:  Allergies  Allergen Reactions  . Codeine Itching  . Hydrocodone-Acetaminophen Itching  . Mushroom Extract Complex Nausea And Vomiting    Only shitake mushroom  causes this reaction.  . Symbicort [Budesonide-Formoterol Fumarate] Itching    Past Medical History, Surgical history, Social history, and Family History were reviewed and updated.  Review of Systems: All other 10 point review of systems is negative.   Physical Exam:  weight is 209 lb (94.8 kg). His oral  temperature is 97.4 F (36.3 C). His blood pressure is 118/68 and his pulse is 57 (abnormal). His respiration is 18.   Wt Readings from Last 3 Encounters:  11/20/15 209 lb (94.8 kg)  10/22/15 209 lb (94.8 kg)  09/24/15 205 lb (93 kg)    Head and neck exam shows no ocular or oral lesions. He now has full hair regrowth. There are no palpable cervical or supraclavicular lymph nodes. Lungs are clear. Cardiac exam regular rate and rhythm with no murmurs, rubs or bruits. Abdomen is soft. He has good bowel sounds. There is no fluid wave. There is no palpable liver or spleen tip. Back exam shows no tenderness over the spine, ribs or hips. He is wearing a back brace. Extremities shows no clubbing, cyanosis or edema. There might be some slight swelling of the right lower leg. No obvious venous cord is noted. He has a negative Homans sign with the right leg. He has good pulses in his distal extremities. Neurological exam is nonfocal. Skin exam shows no rashes, ecchymoses or petechia.d  Lab Results  Component Value Date   WBC 3.9 (L) 11/20/2015   HGB 11.9 (L) 11/20/2015   HCT 35.8 (L) 11/20/2015   MCV 88 11/20/2015   PLT 200 11/20/2015   Lab Results  Component Value Date   FERRITIN 53 02/19/2013   IRON 45 02/19/2013   TIBC 320 02/19/2013   UIBC 275 02/19/2013   IRONPCTSAT 14 (L) 02/19/2013   Lab Results  Component Value Date   RBC 4.09 (L) 11/20/2015   Lab Results  Component Value Date   KPAFRELGTCHN 1.36 02/11/2015   LAMBDASER 1.09 02/11/2015   KAPLAMBRATIO 1.24 10/22/2015   Lab Results  Component Value Date   IGGSERUM 872 10/22/2015   IGA 64 (L) 02/11/2015   IGMSERUM 25 10/22/2015   Lab Results  Component Value Date   TOTALPROTELP 6.0 (L) 02/11/2015   ALBUMINELP 3.2 (L) 02/11/2015   A1GS 0.4 (H) 02/11/2015   A2GS 0.9 02/11/2015   BETS 0.5 02/11/2015   BETA2SER 0.3 02/11/2015   GAMS 0.6 (L) 02/11/2015   MSPIKE Not Observed 06/05/2015   SPEI * 02/11/2015     Chemistry        Component Value Date/Time   NA 137 11/20/2015 0812   NA 141 06/05/2015 1036   K 3.1 (L) 11/20/2015 0812   K 2.9 (LL) 06/05/2015 1036   CL 103 11/20/2015 0812   CO2 26 11/20/2015 0812   CO2 27 06/05/2015 1036   BUN 7 11/20/2015 0812   BUN 13.1 06/05/2015 1036   CREATININE 1.2 11/20/2015 0812   CREATININE 1.0 06/05/2015 1036      Component Value Date/Time   CALCIUM 8.3  11/20/2015 0812   CALCIUM 9.4 06/05/2015 1036   ALKPHOS 95 (H) 11/20/2015 0812   ALKPHOS 94 06/05/2015 1036   AST 31 11/20/2015 0812   AST 22 06/05/2015 1036   ALT 42 11/20/2015 0812   ALT 18 06/05/2015 1036   BILITOT 0.80 11/20/2015 0812   BILITOT 0.59 06/05/2015 1036     Impression and Plan: [name] [name] is a 68 and yo male with history of IgG kappa myeloma. He has completed 5 cycles of chemotherapy and is doing well now on Revlimid. He continues to be followed by [facility] and was last seen in August. They will follow-up with him again in 6 months and he will get his next set of vaccines.   I do not see any evidence that his myeloma is recurrent.  Hopefully, we can help his quality of life. We'll see what his testosterone level is.  I will plan to get him back in one month.  We'll do his Zometa today. After today, we can then go to every other month.    [name], MD 10/6/20179:23 AM

## 2015-11-21 LAB — TESTOSTERONE: Testosterone, Serum: 246 ng/dL — ABNORMAL LOW (ref 264–916)

## 2015-11-23 LAB — KAPPA/LAMBDA LIGHT CHAINS
Ig Kappa Free Light Chain: 21.6 mg/L — ABNORMAL HIGH (ref 3.3–19.4)
Ig Lambda Free Light Chain: 16.9 mg/L (ref 5.7–26.3)
Kappa/Lambda FluidC Ratio: 1.28 (ref 0.26–1.65)

## 2015-11-26 LAB — MULTIPLE MYELOMA PANEL, SERUM
ALBUMIN SERPL ELPH-MCNC: 3.2 g/dL (ref 2.9–4.4)
ALPHA 1: 0.3 g/dL (ref 0.0–0.4)
ALPHA2 GLOB SERPL ELPH-MCNC: 0.8 g/dL (ref 0.4–1.0)
Albumin/Glob SerPl: 1.2 (ref 0.7–1.7)
B-GLOBULIN SERPL ELPH-MCNC: 1 g/dL (ref 0.7–1.3)
GAMMA GLOB SERPL ELPH-MCNC: 0.8 g/dL (ref 0.4–1.8)
GLOBULIN, TOTAL: 2.9 g/dL (ref 2.2–3.9)
IgA, Qn, Serum: 87 mg/dL (ref 61–437)
IgG, Qn, Serum: 833 mg/dL (ref 700–1600)
IgM, Qn, Serum: 20 mg/dL (ref 15–143)
Total Protein: 6.1 g/dL (ref 6.0–8.5)

## 2015-11-27 ENCOUNTER — Encounter: Payer: Self-pay | Admitting: Nurse Practitioner

## 2015-11-30 ENCOUNTER — Other Ambulatory Visit: Payer: Self-pay | Admitting: *Deleted

## 2015-11-30 MED ORDER — LENALIDOMIDE 10 MG PO CAPS: ORAL_CAPSULE | ORAL | 0 refills | Status: DC

## 2015-12-02 ENCOUNTER — Encounter: Payer: Self-pay | Admitting: Sports Medicine

## 2015-12-02 ENCOUNTER — Ambulatory Visit (INDEPENDENT_AMBULATORY_CARE_PROVIDER_SITE_OTHER): Payer: Medicare Other | Admitting: Sports Medicine

## 2015-12-02 DIAGNOSIS — M5030 Other cervical disc degeneration, unspecified cervical region: Secondary | ICD-10-CM | POA: Diagnosis not present

## 2015-12-02 DIAGNOSIS — Z23 Encounter for immunization: Secondary | ICD-10-CM

## 2015-12-02 DIAGNOSIS — I2510 Atherosclerotic heart disease of native coronary artery without angina pectoris: Secondary | ICD-10-CM | POA: Diagnosis not present

## 2015-12-02 DIAGNOSIS — Z0000 Encounter for general adult medical examination without abnormal findings: Secondary | ICD-10-CM

## 2015-12-02 DIAGNOSIS — M47815 Spondylosis without myelopathy or radiculopathy, thoracolumbar region: Secondary | ICD-10-CM

## 2015-12-02 DIAGNOSIS — E119 Type 2 diabetes mellitus without complications: Secondary | ICD-10-CM | POA: Diagnosis not present

## 2015-12-02 MED ORDER — OXYCODONE-ACETAMINOPHEN 10-325 MG PO TABS: 1.0000 | ORAL_TABLET | Freq: Every day | ORAL | 0 refills | Status: DC | PRN

## 2015-12-02 MED ORDER — LORAZEPAM 1 MG PO TABS: 1.0000 mg | ORAL_TABLET | Freq: Every day | ORAL | 1 refills | Status: DC

## 2015-12-02 NOTE — Assessment & Plan Note (Signed)
Flu shot. Holding off on shingles vaccination, he just had a bone marrow transplant, he Will get more vaccinations at [facility].

## 2015-12-02 NOTE — Progress Notes (Signed)
  Subjective:    CC: follow-up  HPI: Cervical spondylosis: Good response to the most recent epidural.  Preventive measures: Due for flu shot, he does have multiple other vaccinations that he is due for but he will be getting these at [facility].  Diabetes mellitus type 2: Due for A1c.  Past medical history:  Negative.  See flowsheet/record as well for more information.  Surgical history: Negative.  See flowsheet/record as well for more information.  Family history: Negative.  See flowsheet/record as well for more information.  Social history: Negative.  See flowsheet/record as well for more information.  Allergies, and medications have been entered into the medical record, reviewed, and no changes needed.   Review of Systems: No fevers, chills, night sweats, weight loss, chest pain, or shortness of breath.   Objective:    General: Well Developed, well nourished, and in no acute distress.  Neuro: Alert and oriented x3, extra-ocular muscles intact, sensation grossly intact.  HEENT: Normocephalic, atraumatic, pupils equal round reactive to light, neck supple, no masses, no lymphadenopathy, thyroid nonpalpable.  Skin: Warm and dry, no rashes. Cardiac: Regular rate and rhythm, no murmurs rubs or gallops, no lower extremity edema.  Respiratory: Clear to auscultation bilaterally. Not using accessory muscles, speaking in full sentences.  Impression and Recommendations:    Diabetes mellitus, type 2 Rechecking hemoglobin A1c and other routine blood work  Annual physical exam Flu shot. Holding off on shingles vaccination, he just had a bone marrow transplant, he Will get more vaccinations at [facility].  Degenerative disc disease, cervical Doing well after 3rd epidural.

## 2015-12-02 NOTE — Assessment & Plan Note (Signed)
Doing well after 3rd epidural.

## 2015-12-02 NOTE — Assessment & Plan Note (Signed)
Rechecking hemoglobin A1c and other routine blood work

## 2015-12-08 ENCOUNTER — Encounter: Payer: Self-pay | Admitting: Sports Medicine

## 2015-12-08 ENCOUNTER — Ambulatory Visit (INDEPENDENT_AMBULATORY_CARE_PROVIDER_SITE_OTHER): Payer: Medicare Other

## 2015-12-08 ENCOUNTER — Ambulatory Visit (INDEPENDENT_AMBULATORY_CARE_PROVIDER_SITE_OTHER): Payer: Medicare Other | Admitting: Sports Medicine

## 2015-12-08 DIAGNOSIS — R05 Cough: Secondary | ICD-10-CM | POA: Diagnosis not present

## 2015-12-08 DIAGNOSIS — I2510 Atherosclerotic heart disease of native coronary artery without angina pectoris: Secondary | ICD-10-CM | POA: Diagnosis not present

## 2015-12-08 DIAGNOSIS — H6123 Impacted cerumen, bilateral: Secondary | ICD-10-CM | POA: Diagnosis not present

## 2015-12-08 DIAGNOSIS — J410 Simple chronic bronchitis: Secondary | ICD-10-CM | POA: Diagnosis not present

## 2015-12-08 DIAGNOSIS — R0989 Other specified symptoms and signs involving the circulatory and respiratory systems: Secondary | ICD-10-CM

## 2015-12-08 MED ORDER — PREDNISONE 50 MG PO TABS: 50.0000 mg | ORAL_TABLET | Freq: Every day | ORAL | 0 refills | Status: DC

## 2015-12-08 MED ORDER — AZITHROMYCIN 250 MG PO TABS
ORAL_TABLET | ORAL | 0 refills | Status: DC
Start: 2015-12-08 — End: 2015-12-14

## 2015-12-08 NOTE — Progress Notes (Signed)
  Subjective:    CC: Feeling sick  HPI: For the past day this pleasant 75 year old male has had malaise, low-grade fevers, cough, sore throat. Adams are mild, persistent, mild nausea, minimal diarrhea. No rash. No chest pain, no shortness of breath.  Past medical history:  Negative.  See flowsheet/record as well for more information.  Surgical history: Negative.  See flowsheet/record as well for more information.  Family history: Negative.  See flowsheet/record as well for more information.  Social history: Negative.  See flowsheet/record as well for more information.  Allergies, and medications have been entered into the medical record, reviewed, and no changes needed.   Review of Systems: No fevers, chills, night sweats, weight loss, chest pain, or shortness of breath.   Objective:    General: Well Developed, well nourished, and in no acute distress.  Neuro: Alert and oriented x3, extra-ocular muscles intact, sensation grossly intact.  HEENT: Normocephalic, atraumatic, pupils equal round reactive to light, neck supple, no masses, no lymphadenopathy, thyroid nonpalpable.  Skin: Warm and dry, no rashes. Cardiac: Regular rate and rhythm, no murmurs rubs or gallops, no lower extremity edema.  Respiratory: Coarse sounds throughout, worse in the left upper lobe. Not using accessory muscles, speaking in full sentences.  Indication: Cerumen impaction of the left and right ear(s) Medical necessity statement: On physical examination, cerumen impairs clinically significant portions of the external auditory canal, and tympanic membrane. Noted obstructive, copious cerumen that cannot be removed without magnification and instrumentations requiring physician skills Consent: Discussed benefits and risks of procedure and verbal consent obtained Procedure: Patient was prepped for the procedure. Utilized an otoscope to assess and take note of the ear canal, the tympanic membrane, and the presence, amount,  and placement of the cerumen. Gentle water irrigation and soft plastic curette was utilized to remove cerumen.  Post procedure examination: shows cerumen was completely removed. Patient tolerated procedure well. The patient is made aware that they may experience temporary vertigo, temporary hearing loss, and temporary discomfort. If these symptom last for more than 24 hours to call the clinic or proceed to the ED.  Impression and Recommendations:    Chronic obstructive pulmonary disease (HCC) Currently in mild exacerbation, prednisone, azithromycin. Chest x-ray. Return as needed.

## 2015-12-08 NOTE — Assessment & Plan Note (Signed)
Currently in mild exacerbation, prednisone, azithromycin. Chest x-ray. Return as needed.

## 2015-12-11 ENCOUNTER — Other Ambulatory Visit: Payer: Self-pay | Admitting: Family

## 2015-12-11 DIAGNOSIS — M899 Disorder of bone, unspecified: Secondary | ICD-10-CM | POA: Insufficient documentation

## 2015-12-14 ENCOUNTER — Encounter: Payer: Self-pay | Admitting: Sports Medicine

## 2015-12-14 ENCOUNTER — Ambulatory Visit (INDEPENDENT_AMBULATORY_CARE_PROVIDER_SITE_OTHER): Payer: Medicare Other

## 2015-12-14 ENCOUNTER — Ambulatory Visit (INDEPENDENT_AMBULATORY_CARE_PROVIDER_SITE_OTHER): Payer: Medicare Other | Admitting: Sports Medicine

## 2015-12-14 DIAGNOSIS — I2510 Atherosclerotic heart disease of native coronary artery without angina pectoris: Secondary | ICD-10-CM

## 2015-12-14 DIAGNOSIS — J449 Chronic obstructive pulmonary disease, unspecified: Secondary | ICD-10-CM

## 2015-12-14 DIAGNOSIS — Z87891 Personal history of nicotine dependence: Secondary | ICD-10-CM | POA: Diagnosis not present

## 2015-12-14 DIAGNOSIS — J410 Simple chronic bronchitis: Secondary | ICD-10-CM

## 2015-12-14 DIAGNOSIS — R05 Cough: Secondary | ICD-10-CM | POA: Diagnosis not present

## 2015-12-14 MED ORDER — DOXYCYCLINE HYCLATE 100 MG PO TABS: 100.0000 mg | ORAL_TABLET | Freq: Two times a day (BID) | ORAL | 0 refills | Status: AC

## 2015-12-14 MED ORDER — BENZONATATE 200 MG PO CAPS: 200.0000 mg | ORAL_CAPSULE | Freq: Three times a day (TID) | ORAL | 0 refills | Status: DC | PRN

## 2015-12-14 MED ORDER — HYDROCOD POLST-CPM POLST ER 10-8 MG/5ML PO SUER: 5.0000 mL | Freq: Two times a day (BID) | ORAL | 0 refills | Status: DC | PRN

## 2015-12-14 NOTE — Assessment & Plan Note (Addendum)
Azithromycin and prednisone have not improved symptoms. With excessive fatigability check blood work including cultures  Repeating chest x-ray, switching to doxycycline. Adding Tussionex and Tessalon Perles.

## 2015-12-14 NOTE — Progress Notes (Signed)
  Subjective:    CC: Persistent cough, fatigue  HPI: This is a pleasant 75 year old male with a history of multiple myeloma, post chemotherapy and bone marrow transplant.  He had a COPD exacerbation recently treated with azithromycin and prednisone, unfortunately he has persistent fatigue, mild cough, minimal constitutional symptoms.  Past medical history:  Negative.  See flowsheet/record as well for more information.  Surgical history: Negative.  See flowsheet/record as well for more information.  Family history: Negative.  See flowsheet/record as well for more information.  Social history: Negative.  See flowsheet/record as well for more information.  Allergies, and medications have been entered into the medical record, reviewed, and no changes needed.   Review of Systems: No fevers, chills, night sweats, weight loss, chest pain, or shortness of breath.   Objective:    General: Well Developed, well nourished, and in no acute distress. Appears tired, fatigued but overall nontoxic. Neuro: Alert and oriented x3, extra-ocular muscles intact, sensation grossly intact.  HEENT: Normocephalic, atraumatic, pupils equal round reactive to light, neck supple, no masses, no lymphadenopathy, thyroid nonpalpable. Oropharynx, nasopharynx, ear canals unremarkable. Skin: Warm and dry, no rashes. Cardiac: Regular rate and rhythm, no murmurs rubs or gallops, no lower extremity edema.  Respiratory: Clear to auscultation bilaterally. Not using accessory muscles, speaking in full sentences.  Impression and Recommendations:    Chronic obstructive pulmonary disease (HCC) Azithromycin and prednisone have not improved symptoms. With excessive fatigability check blood work including cultures  Repeating chest x-ray, switching to doxycycline. Adding Tussionex and Tessalon Perles.  I spent 25 minutes with this patient, greater than 50% was face-to-face time counseling regarding the above diagnoses

## 2015-12-15 LAB — COMPREHENSIVE METABOLIC PANEL
AST: 30 U/L (ref 10–35)
Albumin: 3.9 g/dL (ref 3.6–5.1)
Alkaline Phosphatase: 133 U/L — ABNORMAL HIGH (ref 40–115)
Calcium: 8.4 mg/dL — ABNORMAL LOW (ref 8.6–10.3)
Creat: 1.08 mg/dL (ref 0.70–1.18)
Sodium: 139 mmol/L (ref 135–146)
Total Bilirubin: 0.6 mg/dL (ref 0.2–1.2)
Total Protein: 6.6 g/dL (ref 6.1–8.1)

## 2015-12-15 LAB — COMPREHENSIVE METABOLIC PANEL WITH GFR
ALT: 96 U/L — ABNORMAL HIGH (ref 9–46)
BUN: 17 mg/dL (ref 7–25)
CO2: 28 mmol/L (ref 20–31)
Chloride: 100 mmol/L (ref 98–110)
Glucose, Bld: 115 mg/dL — ABNORMAL HIGH (ref 65–99)
Potassium: 3.5 mmol/L (ref 3.5–5.3)

## 2015-12-15 LAB — CBC WITH DIFFERENTIAL/PLATELET
Basophils Absolute: 71 cells/uL (ref 0–200)
Basophils Relative: 1 %
Eosinophils Absolute: 426 {cells}/uL (ref 15–500)
Eosinophils Relative: 6 %
HCT: 40.5 % (ref 38.5–50.0)
Hemoglobin: 13.2 g/dL (ref 13.2–17.1)
Lymphocytes Relative: 26 %
Lymphs Abs: 1846 cells/uL (ref 850–3900)
MCH: 28.3 pg (ref 27.0–33.0)
MCHC: 32.6 g/dL (ref 32.0–36.0)
MCV: 86.9 fL (ref 80.0–100.0)
MPV: 9.2 fL (ref 7.5–12.5)
Monocytes Absolute: 994 cells/uL — ABNORMAL HIGH (ref 200–950)
Monocytes Relative: 14 %
Neutro Abs: 3763 cells/uL (ref 1500–7800)
Neutrophils Relative %: 53 %
Platelets: 315 K/uL (ref 140–400)
RBC: 4.66 MIL/uL (ref 4.20–5.80)
RDW: 16.8 % — ABNORMAL HIGH (ref 11.0–15.0)
WBC: 7.1 K/uL (ref 3.8–10.8)

## 2015-12-15 LAB — TSH: TSH: 1.16 mIU/L (ref 0.40–4.50)

## 2015-12-22 LAB — CULTURE, BLOOD (SINGLE): Organism ID, Bacteria: NO GROWTH

## 2015-12-25 ENCOUNTER — Other Ambulatory Visit (HOSPITAL_BASED_OUTPATIENT_CLINIC_OR_DEPARTMENT_OTHER): Payer: Medicare Other

## 2015-12-25 ENCOUNTER — Ambulatory Visit (HOSPITAL_BASED_OUTPATIENT_CLINIC_OR_DEPARTMENT_OTHER): Payer: Medicare Other | Admitting: Hematology & Oncology

## 2015-12-25 ENCOUNTER — Encounter: Payer: Self-pay | Admitting: Hematology & Oncology

## 2015-12-25 VITALS — BP 127/68 | HR 64 | Temp 98.1°F | Resp 16 | Ht 72.0 in | Wt 211.0 lb

## 2015-12-25 DIAGNOSIS — C9001 Multiple myeloma in remission: Secondary | ICD-10-CM | POA: Diagnosis not present

## 2015-12-25 DIAGNOSIS — N522 Drug-induced erectile dysfunction: Secondary | ICD-10-CM | POA: Diagnosis not present

## 2015-12-25 DIAGNOSIS — I82401 Acute embolism and thrombosis of unspecified deep veins of right lower extremity: Secondary | ICD-10-CM | POA: Diagnosis not present

## 2015-12-25 LAB — CBC WITH DIFFERENTIAL (CANCER CENTER ONLY)
BASO#: 0.1 10*3/uL (ref 0.0–0.2)
BASO%: 2.1 % — ABNORMAL HIGH (ref 0.0–2.0)
EOS ABS: 0.2 10*3/uL (ref 0.0–0.5)
EOS%: 4.9 % (ref 0.0–7.0)
HCT: 35.3 % — ABNORMAL LOW (ref 38.7–49.9)
HGB: 11.6 g/dL — ABNORMAL LOW (ref 13.0–17.1)
LYMPH#: 1.2 10*3/uL (ref 0.9–3.3)
LYMPH%: 28.8 % (ref 14.0–48.0)
MCH: 29.1 pg (ref 28.0–33.4)
MCHC: 32.9 g/dL (ref 32.0–35.9)
MCV: 89 fL (ref 82–98)
MONO#: 0.6 10*3/uL (ref 0.1–0.9)
MONO%: 12.8 % (ref 0.0–13.0)
NEUT#: 2.2 10*3/uL (ref 1.5–6.5)
NEUT%: 51.4 % (ref 40.0–80.0)
PLATELETS: 217 10*3/uL (ref 145–400)
RBC: 3.99 10*6/uL — ABNORMAL LOW (ref 4.20–5.70)
RDW: 15 % (ref 11.1–15.7)
WBC: 4.3 10*3/uL (ref 4.0–10.0)

## 2015-12-25 LAB — CHCC SATELLITE - SMEAR

## 2015-12-25 LAB — CMP (CANCER CENTER ONLY)
ALT(SGPT): 39 U/L (ref 10–47)
AST: 32 U/L (ref 11–38)
Albumin: 3.2 g/dL — ABNORMAL LOW (ref 3.3–5.5)
Alkaline Phosphatase: 113 U/L — ABNORMAL HIGH (ref 26–84)
BUN: 9 mg/dL (ref 7–22)
CHLORIDE: 100 meq/L (ref 98–108)
CO2: 30 meq/L (ref 18–33)
Calcium: 8.8 mg/dL (ref 8.0–10.3)
Creat: 1.4 mg/dl — ABNORMAL HIGH (ref 0.6–1.2)
GLUCOSE: 82 mg/dL (ref 73–118)
POTASSIUM: 3.3 meq/L (ref 3.3–4.7)
Sodium: 139 mEq/L (ref 128–145)
Total Bilirubin: 0.6 mg/dl (ref 0.20–1.60)
Total Protein: 6.9 g/dL (ref 6.4–8.1)

## 2015-12-25 NOTE — Progress Notes (Signed)
Hematology and Oncology Follow Up Visit  [name] [name] [id number] 11-08-1940 75 y.o. 12/25/2015   Principle Diagnosis:  IgG kappa myeloma - Trisomy 11 by FISH Acute thromboembolism of the right lower leg  Current Therapy:   Patient s/p cycle 5 of Velcade/Revlimid/Decadron S/P ASCT at [facility] on 04/02/2015 Revlimid 10mg  po q day (21/28) Zometa 4 mg IV every 2 months Xarelto 10 mg by mouth daily    Interim History: [name] [name] is here today for follow-up. He is feeling a little bit better. He had a good birthday. He turned 75 years old. I'm glad he was able to enjoy it.  Over Christmas, he and his wife are going down to her family's residence in [place]. I don't see any problems with him going down there.  As far as his myeloma is concerned, he's done well with myeloma area and his last monoclonal studies did not show any evidence of a monoclonal spike.  He's had some fatigue. I did check his testosterone level and found that the testosterone level was a little on the lower side. I don't think we have to give him any counter replacement therapy.  He is on low-dose Xarelto and baby aspirin. He had the blood clot. I want him on the low-dose Xarelto for right now. I think the Xarelto aspirin combination will be good for him when he goes down to [place].  He is working out. He is doing a little better with exercising.  Overall, his performance status is ECOG 1.    Medications:    Medication List       Accurate as of 12/25/15 11:56 AM. Always use your most recent med list.          amLODipine 5 MG tablet Commonly known as:  NORVASC TAKE 1 TABLET EVERY DAY   aspirin EC 81 MG tablet Take by mouth.   benzonatate 200 MG capsule Commonly known as:  TESSALON Take 1 capsule (200 mg total) by mouth 3 (three) times daily as needed for cough.   carvedilol 6.25 MG tablet Commonly known as:  COREG TAKE 1 TABLET TWICE DAILY WITH A MEAL   chlorpheniramine-HYDROcodone 10-8 MG/5ML  Suer Commonly known as:  TUSSIONEX Take 5 mLs by mouth every 12 (twelve) hours as needed for cough (cough, will cause drowsiness.).   esomeprazole 40 MG capsule Commonly known as:  NEXIUM Take 20 mg by mouth daily at 12 noon.   famciclovir 500 MG tablet Commonly known as:  FAMVIR TAKE 1 TABLET EVERY DAY   gabapentin 600 MG tablet Commonly known as:  NEURONTIN 600 mg in the morning, 1200 mg at night   hydrochlorothiazide 12.5 MG capsule Commonly known as:  MICROZIDE TAKE 1 CAPSULE EVERY DAY   isosorbide mononitrate 30 MG 24 hr tablet Commonly known as:  IMDUR TAKE 1 TABLET TWICE DAILY   lenalidomide 10 MG capsule Commonly known as:  REVLIMID Take daily, days 1-21. Off for 7 days. Repeat every 28 days. [id number]   Loratadine 10 MG Caps Take by mouth.   LORazepam 1 MG tablet Commonly known as:  ATIVAN Take 1 tablet (1 mg total) by mouth at bedtime.   montelukast 10 MG tablet Commonly known as:  SINGULAIR TAKE 1 TABLET AT BEDTIME   nitroGLYCERIN 0.4 MG SL tablet Commonly known as:  NITROSTAT Place 0.4 mg under the tongue every 5 (five) minutes as needed. For chest pain   oxyCODONE-acetaminophen 10-325 MG tablet Commonly known as:  PERCOCET Take 1 tablet by mouth daily  as needed for pain.   potassium chloride SA 20 MEQ tablet Commonly known as:  K-DUR,KLOR-CON Take 1 tablet (20 mEq total) by mouth 2 (two) times daily.   pyridoxine 100 MG tablet Commonly known as:  B-6 Take by mouth.   RA VITAMIN B-12 TR 1000 MCG Tbcr Generic drug:  Cyanocobalamin Take by mouth.   rivaroxaban 20 MG Tabs tablet Commonly known as:  XARELTO Take 1 tablet (20 mg total) by mouth daily with supper.       Allergies:  Allergies  Allergen Reactions  . Codeine Itching  . Hydrocodone-Acetaminophen Itching  . Mushroom Extract Complex Nausea And Vomiting    Only shitake mushroom  causes this reaction.  . Symbicort [Budesonide-Formoterol Fumarate] Itching    Past Medical  History, Surgical history, Social history, and Family History were reviewed and updated.  Review of Systems: All other 10 point review of systems is negative.   Physical Exam:  height is 6' (1.829 m) and weight is 211 lb (95.7 kg). His oral temperature is 98.1 F (36.7 C). His blood pressure is 127/68 and his pulse is 64. His respiration is 16.   Wt Readings from Last 3 Encounters:  12/25/15 211 lb (95.7 kg)  12/14/15 210 lb (95.3 kg)  12/08/15 210 lb (95.3 kg)    Head and neck exam shows no ocular or oral lesions. He now has full hair regrowth. There are no palpable cervical or supraclavicular lymph nodes. Lungs are clear. Cardiac exam regular rate and rhythm with no murmurs, rubs or bruits. Abdomen is soft. He has good bowel sounds. There is no fluid wave. There is no palpable liver or spleen tip. Back exam shows no tenderness over the spine, ribs or hips. He is wearing a back brace. Extremities shows no clubbing, cyanosis or edema. There might be some slight swelling of the right lower leg. No obvious venous cord is noted. He has a negative Homans sign with the right leg. He has good pulses in his distal extremities. Neurological exam is nonfocal. Skin exam shows no rashes, ecchymoses or petechia.d  Lab Results  Component Value Date   WBC 4.3 12/25/2015   HGB 11.6 (L) 12/25/2015   HCT 35.3 (L) 12/25/2015   MCV 89 12/25/2015   PLT 217 12/25/2015   Lab Results  Component Value Date   FERRITIN 53 02/19/2013   IRON 45 02/19/2013   TIBC 320 02/19/2013   UIBC 275 02/19/2013   IRONPCTSAT 14 (L) 02/19/2013   Lab Results  Component Value Date   RBC 3.99 (L) 12/25/2015   Lab Results  Component Value Date   KPAFRELGTCHN 1.36 02/11/2015   LAMBDASER 1.09 02/11/2015   KAPLAMBRATIO 1.28 11/20/2015   Lab Results  Component Value Date   IGGSERUM 833 11/20/2015   IGA 64 (L) 02/11/2015   IGMSERUM 20 11/20/2015   Lab Results  Component Value Date   TOTALPROTELP 6.0 (L) 02/11/2015    ALBUMINELP 3.2 (L) 02/11/2015   A1GS 0.4 (H) 02/11/2015   A2GS 0.9 02/11/2015   BETS 0.5 02/11/2015   BETA2SER 0.3 02/11/2015   GAMS 0.6 (L) 02/11/2015   MSPIKE Not Observed 06/05/2015   SPEI * 02/11/2015     Chemistry      Component Value Date/Time   NA 139 12/25/2015 0945   NA 141 06/05/2015 1036   K 3.3 12/25/2015 0945   K 2.9 (LL) 06/05/2015 1036   CL 100 12/25/2015 0945   CO2 30 12/25/2015 0945   CO2 27 06/05/2015  1036   BUN 9 12/25/2015 0945   BUN 13.1 06/05/2015 1036   CREATININE 1.4 (H) 12/25/2015 0945   CREATININE 1.0 06/05/2015 1036      Component Value Date/Time   CALCIUM 8.8 12/25/2015 0945   CALCIUM 9.4 06/05/2015 1036   ALKPHOS 113 (H) 12/25/2015 0945   ALKPHOS 94 06/05/2015 1036   AST 32 12/25/2015 0945   AST 22 06/05/2015 1036   ALT 39 12/25/2015 0945   ALT 18 06/05/2015 1036   BILITOT 0.60 12/25/2015 0945   BILITOT 0.59 06/05/2015 1036     Impression and Plan: [name] [name] is a 54 and yo male with history of IgG kappa myeloma. He has completed 5 cycles of chemotherapy and is doing well now on Revlimid. He continues to be followed by [facility] . I do not see any evidence that his myeloma is recurrent.  I'm glad that he is going down to [place] with his wife. I know that he will have a good time down there.  I will see him back in 4 weeks. I want to make sure that everything is okay before he goes down to [place].    [name], MD 11/10/201711:56 AM

## 2015-12-26 LAB — IGG, IGA, IGM
IGA/IMMUNOGLOBULIN A, SERUM: 96 mg/dL (ref 61–437)
IGG (IMMUNOGLOBIN G), SERUM: 891 mg/dL (ref 700–1600)
IGM (IMMUNOGLOBIN M), SRM: 42 mg/dL (ref 15–143)

## 2015-12-28 ENCOUNTER — Other Ambulatory Visit: Payer: Self-pay | Admitting: *Deleted

## 2015-12-28 LAB — PROTEIN ELECTROPHORESIS, SERUM, WITH REFLEX
A/G RATIO SPE: 1 (ref 0.7–1.7)
Albumin: 3.1 g/dL (ref 2.9–4.4)
Alpha 1: 0.3 g/dL (ref 0.0–0.4)
Alpha 2: 0.8 g/dL (ref 0.4–1.0)
Beta: 1 g/dL (ref 0.7–1.3)
GLOBULIN, TOTAL: 3 g/dL (ref 2.2–3.9)
Gamma Globulin: 0.9 g/dL (ref 0.4–1.8)
TOTAL PROTEIN: 6.1 g/dL (ref 6.0–8.5)

## 2015-12-28 LAB — KAPPA/LAMBDA LIGHT CHAINS
IG KAPPA FREE LIGHT CHAIN: 25.2 mg/L — AB (ref 3.3–19.4)
IG LAMBDA FREE LIGHT CHAIN: 19.8 mg/L (ref 5.7–26.3)
Kappa/Lambda FluidC Ratio: 1.27 (ref 0.26–1.65)

## 2015-12-28 MED ORDER — LENALIDOMIDE 10 MG PO CAPS: ORAL_CAPSULE | ORAL | 0 refills | Status: DC

## 2015-12-30 ENCOUNTER — Other Ambulatory Visit: Payer: Self-pay | Admitting: *Deleted

## 2015-12-30 MED ORDER — LENALIDOMIDE 10 MG PO CAPS: ORAL_CAPSULE | ORAL | 0 refills | Status: DC

## 2016-01-06 ENCOUNTER — Encounter: Payer: Self-pay | Admitting: Hematology & Oncology

## 2016-01-11 ENCOUNTER — Other Ambulatory Visit: Payer: Self-pay | Admitting: Hematology & Oncology

## 2016-01-11 ENCOUNTER — Other Ambulatory Visit: Payer: Self-pay | Admitting: Sports Medicine

## 2016-01-14 ENCOUNTER — Encounter: Payer: Self-pay | Admitting: Sports Medicine

## 2016-01-21 ENCOUNTER — Ambulatory Visit (HOSPITAL_BASED_OUTPATIENT_CLINIC_OR_DEPARTMENT_OTHER): Payer: Medicare Other

## 2016-01-21 ENCOUNTER — Ambulatory Visit (HOSPITAL_BASED_OUTPATIENT_CLINIC_OR_DEPARTMENT_OTHER): Payer: Medicare Other | Admitting: Hematology & Oncology

## 2016-01-21 ENCOUNTER — Other Ambulatory Visit: Payer: Self-pay | Admitting: *Deleted

## 2016-01-21 ENCOUNTER — Ambulatory Visit (HOSPITAL_BASED_OUTPATIENT_CLINIC_OR_DEPARTMENT_OTHER)
Admission: RE | Admit: 2016-01-21 | Discharge: 2016-01-21 | Disposition: A | Discharge status: Home / Self Care | Payer: Medicare Other | Source: Ambulatory Visit | Attending: Hematology & Oncology | Admitting: Hematology & Oncology

## 2016-01-21 ENCOUNTER — Other Ambulatory Visit (HOSPITAL_BASED_OUTPATIENT_CLINIC_OR_DEPARTMENT_OTHER): Payer: Medicare Other

## 2016-01-21 ENCOUNTER — Telehealth: Payer: Self-pay | Admitting: *Deleted

## 2016-01-21 VITALS — BP 126/61 | HR 66 | Temp 98.0°F | Resp 16 | Wt 209.0 lb

## 2016-01-21 DIAGNOSIS — R05 Cough: Secondary | ICD-10-CM | POA: Diagnosis not present

## 2016-01-21 DIAGNOSIS — R053 Chronic cough: Secondary | ICD-10-CM

## 2016-01-21 DIAGNOSIS — M898X9 Other specified disorders of bone, unspecified site: Secondary | ICD-10-CM

## 2016-01-21 DIAGNOSIS — C9001 Multiple myeloma in remission: Secondary | ICD-10-CM | POA: Diagnosis not present

## 2016-01-21 DIAGNOSIS — M899 Disorder of bone, unspecified: Secondary | ICD-10-CM

## 2016-01-21 DIAGNOSIS — C9000 Multiple myeloma not having achieved remission: Secondary | ICD-10-CM

## 2016-01-21 LAB — CMP (CANCER CENTER ONLY)
ALBUMIN: 3.4 g/dL (ref 3.3–5.5)
ALK PHOS: 102 U/L — AB (ref 26–84)
ALT: 39 U/L (ref 10–47)
AST: 45 U/L — AB (ref 11–38)
BILIRUBIN TOTAL: 0.8 mg/dL (ref 0.20–1.60)
BUN: 9 mg/dL (ref 7–22)
CHLORIDE: 101 meq/L (ref 98–108)
CO2: 28 mEq/L (ref 18–33)
CREATININE: 1.1 mg/dL (ref 0.6–1.2)
Calcium: 9.3 mg/dL (ref 8.0–10.3)
Glucose, Bld: 108 mg/dL (ref 73–118)
Potassium: 2.9 mEq/L — CL (ref 3.3–4.7)
SODIUM: 136 meq/L (ref 128–145)
TOTAL PROTEIN: 6.8 g/dL (ref 6.4–8.1)

## 2016-01-21 LAB — CBC WITH DIFFERENTIAL (CANCER CENTER ONLY)
BASO#: 0.1 10*3/uL (ref 0.0–0.2)
BASO%: 2.9 % — AB (ref 0.0–2.0)
EOS ABS: 0.2 10*3/uL (ref 0.0–0.5)
EOS%: 4.5 % (ref 0.0–7.0)
HCT: 36 % — ABNORMAL LOW (ref 38.7–49.9)
HEMOGLOBIN: 11.9 g/dL — AB (ref 13.0–17.1)
LYMPH#: 1.3 10*3/uL (ref 0.9–3.3)
LYMPH%: 35.5 % (ref 14.0–48.0)
MCH: 28.7 pg (ref 28.0–33.4)
MCHC: 33.1 g/dL (ref 32.0–35.9)
MCV: 87 fL (ref 82–98)
MONO#: 0.6 10*3/uL (ref 0.1–0.9)
MONO%: 15.1 % — AB (ref 0.0–13.0)
NEUT%: 42 % (ref 40.0–80.0)
NEUTROS ABS: 1.6 10*3/uL (ref 1.5–6.5)
Platelets: 218 10*3/uL (ref 145–400)
RBC: 4.15 10*6/uL — AB (ref 4.20–5.70)
RDW: 15.4 % (ref 11.1–15.7)
WBC: 3.8 10*3/uL — AB (ref 4.0–10.0)

## 2016-01-21 MED ORDER — SODIUM CHLORIDE 0.9 % IJ SOLN
10.0000 mL | INTRAMUSCULAR | Status: DC | PRN
  Filled 2016-01-21: qty 10

## 2016-01-21 MED ORDER — HEPARIN SOD (PORK) LOCK FLUSH 100 UNIT/ML IV SOLN
500.0000 [IU] | Freq: Once | INTRAVENOUS | Status: DC | PRN
  Filled 2016-01-21: qty 5

## 2016-01-21 MED ORDER — LENALIDOMIDE 10 MG PO CAPS: ORAL_CAPSULE | ORAL | 0 refills | Status: DC

## 2016-01-21 MED ORDER — ZOLEDRONIC ACID 4 MG/100ML IV SOLN
4.0000 mg | Freq: Once | INTRAVENOUS | Status: AC
  Administered 2016-01-21: 4 mg via INTRAVENOUS
  Filled 2016-01-21: qty 100

## 2016-01-21 MED ORDER — SODIUM CHLORIDE 0.9 % IV SOLN
INTRAVENOUS | Status: DC
  Administered 2016-01-21: 15:00:00 via INTRAVENOUS

## 2016-01-21 NOTE — Telephone Encounter (Signed)
Critical Value Potassium 2.9 Dr Ennever notified. No orders at this time 

## 2016-01-21 NOTE — Patient Instructions (Signed)

## 2016-01-21 NOTE — Progress Notes (Signed)
Hematology and Oncology Follow Up Visit  [name] [name] [id number] 1940/04/05 75 y.o. 01/21/2016   Principle Diagnosis:  IgG kappa myeloma - Trisomy 11 by FISH Acute thromboembolism of the right lower leg  Current Therapy:   Patient s/p cycle 5 of Velcade/Revlimid/Decadron S/P ASCT at [facility] on 04/02/2015 Revlimid 10mg  po q day (21/28) Zometa 4 mg IV every 2 months Xarelto 10 mg by mouth daily    Interim History: [name] is here today for follow-up. He is getting ready to go down to [place]. His wife's mother is down there. She has lung cancer. His wife is going down this weekend.  He has this dry cough. He is seeing his family doctor about this. He's been given there is treatments. He has not been on antibiotics. He has not had a chest x-ray. We will see about getting a chest x-ray on him.  He has had no fever. He's had no nausea or vomiting. He's had no diarrhea.  He has not been taking his potassium. I think that this is important for him. He seems to waste potassium which might be a side effect from his past stem cell transplant in a chemotherapy that he took for this.  His last myeloma studies back in November did not show a monoclonal spike. His IgG level was 891 mg/dL. His Kappa Lightchain was 25 mg/L. This is stable.   He did have the thrombus in the right leg. He is on low-dose Xarelto. I want him on low-dose Xarelto for right now. I did tell him to take some low-dose aspirin when he goes down to [place].   He is working out. He is doing a little better with exercising.  Overall, his performance status is ECOG 1.    Medications:    Medication List       Accurate as of 01/21/16  3:28 PM. Always use your most recent med list.          amLODipine 5 MG tablet Commonly known as:  NORVASC TAKE 1 TABLET EVERY DAY   aspirin EC 81 MG tablet Take by mouth.   benzonatate 200 MG capsule Commonly known as:  TESSALON Take 1 capsule (200 mg total) by mouth 3 (three)  times daily as needed for cough.   carvedilol 6.25 MG tablet Commonly known as:  COREG TAKE 1 TABLET TWICE DAILY WITH A MEAL   chlorpheniramine-HYDROcodone 10-8 MG/5ML Suer Commonly known as:  TUSSIONEX Take 5 mLs by mouth every 12 (twelve) hours as needed for cough (cough, will cause drowsiness.).   esomeprazole 40 MG capsule Commonly known as:  NEXIUM Take 20 mg by mouth daily at 12 noon.   famciclovir 500 MG tablet Commonly known as:  FAMVIR TAKE 1 TABLET EVERY DAY   gabapentin 600 MG tablet Commonly known as:  NEURONTIN 600 mg in the morning, 1200 mg at night   hydrochlorothiazide 12.5 MG capsule Commonly known as:  MICROZIDE TAKE 1 CAPSULE EVERY DAY   isosorbide mononitrate 30 MG 24 hr tablet Commonly known as:  IMDUR TAKE 1 TABLET TWICE DAILY   lenalidomide 10 MG capsule Commonly known as:  REVLIMID Take daily, days 1-21. Off for 7 days. Repeat every 28 days. [id number]   Loratadine 10 MG Caps Take by mouth.   LORazepam 1 MG tablet Commonly known as:  ATIVAN Take 1 tablet (1 mg total) by mouth at bedtime.   montelukast 10 MG tablet Commonly known as:  SINGULAIR TAKE 1 TABLET AT BEDTIME   nitroGLYCERIN  0.4 MG SL tablet Commonly known as:  NITROSTAT Place 0.4 mg under the tongue every 5 (five) minutes as needed. For chest pain   oxyCODONE-acetaminophen 10-325 MG tablet Commonly known as:  PERCOCET Take 1 tablet by mouth daily as needed for pain.   potassium chloride SA 20 MEQ tablet Commonly known as:  K-DUR,KLOR-CON TAKE 1 TABLET TWICE DAILY   pyridoxine 100 MG tablet Commonly known as:  B-6 Take by mouth.   RA VITAMIN B-12 TR 1000 MCG Tbcr Generic drug:  Cyanocobalamin Take by mouth.   rivaroxaban 20 MG Tabs tablet Commonly known as:  XARELTO Take 1 tablet (20 mg total) by mouth daily with supper.       Allergies:  Allergies  Allergen Reactions  . Codeine Itching  . Hydrocodone-Acetaminophen Itching  . Mushroom Extract Complex  Nausea And Vomiting    Only shitake mushroom  causes this reaction.  . Symbicort [Budesonide-Formoterol Fumarate] Itching    Past Medical History, Surgical history, Social history, and Family History were reviewed and updated.  Review of Systems: All other 10 point review of systems is negative.   Physical Exam:  weight is 209 lb (94.8 kg). His oral temperature is 98 F (36.7 C). His blood pressure is 126/61 and his pulse is 66. His respiration is 16.   Wt Readings from Last 3 Encounters:  01/21/16 209 lb (94.8 kg)  12/25/15 211 lb (95.7 kg)  12/14/15 210 lb (95.3 kg)    Head and neck exam shows no ocular or oral lesions. He now has full hair regrowth. There are no palpable cervical or supraclavicular lymph nodes. Lungs are clear. Cardiac exam regular rate and rhythm with no murmurs, rubs or bruits. Abdomen is soft. He has good bowel sounds. There is no fluid wave. There is no palpable liver or spleen tip. Back exam shows no tenderness over the spine, ribs or hips. He is wearing a back brace. Extremities shows no clubbing, cyanosis or edema. There might be some slight swelling of the right lower leg. No obvious venous cord is noted. He has a negative Homans sign with the right leg. He has good pulses in his distal extremities. Neurological exam is nonfocal. Skin exam shows no rashes, ecchymoses or petechia.d  Lab Results  Component Value Date   WBC 3.8 (L) 01/21/2016   HGB 11.9 (L) 01/21/2016   HCT 36.0 (L) 01/21/2016   MCV 87 01/21/2016   PLT 218 01/21/2016   Lab Results  Component Value Date   FERRITIN 53 02/19/2013   IRON 45 02/19/2013   TIBC 320 02/19/2013   UIBC 275 02/19/2013   IRONPCTSAT 14 (L) 02/19/2013   Lab Results  Component Value Date   RBC 4.15 (L) 01/21/2016   Lab Results  Component Value Date   KPAFRELGTCHN 1.36 02/11/2015   LAMBDASER 1.09 02/11/2015   KAPLAMBRATIO 1.27 12/25/2015   Lab Results  Component Value Date   IGGSERUM 891 12/25/2015   IGA 64  (L) 02/11/2015   IGMSERUM 42 12/25/2015   Lab Results  Component Value Date   TOTALPROTELP 6.0 (L) 02/11/2015   ALBUMINELP 3.2 (L) 02/11/2015   A1GS 0.4 (H) 02/11/2015   A2GS 0.9 02/11/2015   BETS 0.5 02/11/2015   BETA2SER 0.3 02/11/2015   GAMS 0.6 (L) 02/11/2015   MSPIKE Not Observed 12/25/2015   SPEI * 02/11/2015     Chemistry      Component Value Date/Time   NA 136 01/21/2016 1410   NA 141 06/05/2015 1036  K 2.9 (LL) 01/21/2016 1410   K 2.9 (LL) 06/05/2015 1036   CL 101 01/21/2016 1410   CO2 28 01/21/2016 1410   CO2 27 06/05/2015 1036   BUN 9 01/21/2016 1410   BUN 13.1 06/05/2015 1036   CREATININE 1.1 01/21/2016 1410   CREATININE 1.0 06/05/2015 1036      Component Value Date/Time   CALCIUM 9.3 01/21/2016 1410   CALCIUM 9.4 06/05/2015 1036   ALKPHOS 102 (H) 01/21/2016 1410   ALKPHOS 94 06/05/2015 1036   AST 45 (H) 01/21/2016 1410   AST 22 06/05/2015 1036   ALT 39 01/21/2016 1410   ALT 18 06/05/2015 1036   BILITOT 0.80 01/21/2016 1410   BILITOT 0.59 06/05/2015 1036     Impression and Plan: Mr. Zucconi is a 33 and yo male with history of IgG kappa myeloma. He has completed 5 cycles of chemotherapy and is doing well now on Revlimid. He continues to be followed by [facility] . I do not see any evidence that his myeloma is recurrent.  He will be headed down to [place] on December 20. He will be down there for 2 or 3 weeks. I told him to take whatever Revlimid he had an take it with him. If he is off Revlimid for 2 weeks, this will not be a bother and will not be a hindrance to him.   We will go ahead and get a chest x-ray on him. He has this cough. It typically is nonproductive.   I told him to try some steroid cream for his face. I'm not sure if this rash that he has was from the Revlimid. He certainly had no problems prior to the transplant with Revlimid.   We will see him back after he is back from [place].     [name], MD 12/7/20173:28 PM

## 2016-01-22 ENCOUNTER — Telehealth: Payer: Self-pay | Admitting: *Deleted

## 2016-01-22 LAB — IGG, IGA, IGM
IGA/IMMUNOGLOBULIN A, SERUM: 119 mg/dL (ref 61–437)
IGG (IMMUNOGLOBIN G), SERUM: 929 mg/dL (ref 700–1600)
IgM, Qn, Serum: 24 mg/dL (ref 15–143)

## 2016-01-22 LAB — KAPPA/LAMBDA LIGHT CHAINS
Ig Kappa Free Light Chain: 20.5 mg/L — ABNORMAL HIGH (ref 3.3–19.4)
Ig Lambda Free Light Chain: 18.3 mg/L (ref 5.7–26.3)
Kappa/Lambda FluidC Ratio: 1.12 (ref 0.26–1.65)

## 2016-01-22 MED ORDER — METHYLPREDNISOLONE 4 MG PO TBPK: ORAL_TABLET | ORAL | 0 refills | Status: DC

## 2016-01-22 NOTE — Telephone Encounter (Addendum)
Patient aware of results and new prescription.   ----- Message from [name], MD sent at 01/22/2016  8:43 AM EST ----- Call - the CXR is normal!!!  I do not think you need an abx.  This might be from the transplant!!  Please call in a medrol dose pack!!!  [name] [name]

## 2016-01-25 LAB — PROTEIN ELECTROPHORESIS, SERUM, WITH REFLEX
A/G Ratio: 1.1 (ref 0.7–1.7)
ALBUMIN: 3.4 g/dL (ref 2.9–4.4)
ALPHA 1: 0.2 g/dL (ref 0.0–0.4)
Alpha 2: 0.8 g/dL (ref 0.4–1.0)
Beta: 1.1 g/dL (ref 0.7–1.3)
Gamma Globulin: 0.9 g/dL (ref 0.4–1.8)
Globulin, Total: 3 g/dL (ref 2.2–3.9)
TOTAL PROTEIN: 6.4 g/dL (ref 6.0–8.5)

## 2016-01-26 ENCOUNTER — Telehealth: Payer: Self-pay | Admitting: *Deleted

## 2016-01-26 NOTE — Telephone Encounter (Addendum)
Patient aware of results  ----- Message from [name], MD sent at 01/25/2016  4:00 PM EST ----- Call - NO myeloma protein in the blood!!!  Have a great time in [place]!!!  pete

## 2016-01-28 ENCOUNTER — Encounter: Payer: Self-pay | Admitting: Hematology & Oncology

## 2016-01-29 ENCOUNTER — Other Ambulatory Visit: Payer: Medicare Other

## 2016-01-29 ENCOUNTER — Ambulatory Visit: Payer: Medicare Other | Admitting: Hematology & Oncology

## 2016-01-29 ENCOUNTER — Ambulatory Visit: Payer: Medicare Other

## 2016-02-01 ENCOUNTER — Other Ambulatory Visit: Payer: Self-pay | Admitting: Hematology & Oncology

## 2016-02-02 ENCOUNTER — Other Ambulatory Visit: Payer: Self-pay | Admitting: *Deleted

## 2016-02-02 MED ORDER — METHYLPREDNISOLONE 4 MG PO TBPK: ORAL_TABLET | ORAL | 0 refills | Status: DC

## 2016-02-09 ENCOUNTER — Other Ambulatory Visit: Payer: Self-pay | Admitting: *Deleted

## 2016-02-09 MED ORDER — LENALIDOMIDE 10 MG PO CAPS: ORAL_CAPSULE | ORAL | 0 refills | Status: DC

## 2016-02-12 ENCOUNTER — Other Ambulatory Visit: Payer: Self-pay | Admitting: *Deleted

## 2016-02-12 MED ORDER — LENALIDOMIDE 10 MG PO CAPS: ORAL_CAPSULE | ORAL | 0 refills | Status: DC

## 2016-03-03 ENCOUNTER — Ambulatory Visit: Payer: Medicare Other

## 2016-03-03 ENCOUNTER — Other Ambulatory Visit: Payer: Medicare Other

## 2016-03-03 ENCOUNTER — Ambulatory Visit: Payer: Medicare Other | Admitting: Family

## 2016-03-08 ENCOUNTER — Encounter: Payer: Self-pay | Admitting: Family

## 2016-03-08 ENCOUNTER — Ambulatory Visit (HOSPITAL_BASED_OUTPATIENT_CLINIC_OR_DEPARTMENT_OTHER): Payer: Medicare Other | Admitting: Family

## 2016-03-08 ENCOUNTER — Other Ambulatory Visit (HOSPITAL_BASED_OUTPATIENT_CLINIC_OR_DEPARTMENT_OTHER): Payer: Medicare Other

## 2016-03-08 VITALS — BP 132/65 | HR 61 | Temp 97.7°F | Resp 17 | Wt 208.0 lb

## 2016-03-08 DIAGNOSIS — R05 Cough: Secondary | ICD-10-CM

## 2016-03-08 DIAGNOSIS — C9001 Multiple myeloma in remission: Secondary | ICD-10-CM | POA: Diagnosis not present

## 2016-03-08 DIAGNOSIS — C9000 Multiple myeloma not having achieved remission: Secondary | ICD-10-CM

## 2016-03-08 DIAGNOSIS — R053 Chronic cough: Secondary | ICD-10-CM

## 2016-03-08 DIAGNOSIS — G629 Polyneuropathy, unspecified: Secondary | ICD-10-CM | POA: Diagnosis not present

## 2016-03-08 LAB — CBC WITH DIFFERENTIAL (CANCER CENTER ONLY)
BASO#: 0 10*3/uL (ref 0.0–0.2)
BASO%: 1.1 % (ref 0.0–2.0)
EOS%: 6.6 % (ref 0.0–7.0)
Eosinophils Absolute: 0.2 10*3/uL (ref 0.0–0.5)
HEMATOCRIT: 36.8 % — AB (ref 38.7–49.9)
HEMOGLOBIN: 12.1 g/dL — AB (ref 13.0–17.1)
LYMPH#: 1.4 10*3/uL (ref 0.9–3.3)
LYMPH%: 37.7 % (ref 14.0–48.0)
MCH: 28.5 pg (ref 28.0–33.4)
MCHC: 32.9 g/dL (ref 32.0–35.9)
MCV: 87 fL (ref 82–98)
MONO#: 0.6 10*3/uL (ref 0.1–0.9)
MONO%: 15.3 % — ABNORMAL HIGH (ref 0.0–13.0)
NEUT%: 39.3 % — ABNORMAL LOW (ref 40.0–80.0)
NEUTROS ABS: 1.4 10*3/uL — AB (ref 1.5–6.5)
Platelets: 208 10*3/uL (ref 145–400)
RBC: 4.24 10*6/uL (ref 4.20–5.70)
RDW: 17.1 % — ABNORMAL HIGH (ref 11.1–15.7)
WBC: 3.7 10*3/uL — AB (ref 4.0–10.0)

## 2016-03-08 LAB — CMP (CANCER CENTER ONLY)
ALBUMIN: 3.2 g/dL — AB (ref 3.3–5.5)
ALK PHOS: 99 U/L — AB (ref 26–84)
ALT: 35 U/L (ref 10–47)
AST: 27 U/L (ref 11–38)
BILIRUBIN TOTAL: 0.8 mg/dL (ref 0.20–1.60)
BUN, Bld: 10 mg/dL (ref 7–22)
CALCIUM: 8.8 mg/dL (ref 8.0–10.3)
CO2: 29 mEq/L (ref 18–33)
CREATININE: 1.1 mg/dL (ref 0.6–1.2)
Chloride: 103 mEq/L (ref 98–108)
Glucose, Bld: 114 mg/dL (ref 73–118)
Potassium: 3.6 mEq/L (ref 3.3–4.7)
Sodium: 140 mEq/L (ref 128–145)
TOTAL PROTEIN: 6.8 g/dL (ref 6.4–8.1)

## 2016-03-08 NOTE — Progress Notes (Signed)
Hematology and Oncology Follow Up Visit  Calder Janiga [id number] 02-19-1940 76 y.o. 03/08/2016   Principle Diagnosis:  IgG kappa myeloma - Trisomy 11 by FISH Acute thromboembolism of the right lower leg  Current Therapy:   Patient s/p cycle 5 of Velcade/Revlimid/Decadron S/P ASCT at [facility] on 04/02/2015 Revlimid 10mg  po q day (21/28) Zometa 4 mg IV every month Xarelto 10 mg by mouth daily    Interim History: Mr. Lemmo is here today for follow-up. He had an unfortunate incident on his way to [place] to meet his wife and family. He fell down an escalator in [place] and skinned his elbow and knee. Thankfully, he did not break anything. He received a tetanus shot. Once he arrived in [place] for 3 weeks, he developed a bad bronchitis with an exacerbation of his COPD and was treated with albuterol and Atrovent inhalers, Prednisone and Avelox. He states that this was due to going in and out from the heat outside to the air conditioning inside. Now that he is back home he is feeling better. He is still having some fatigue, weakness and a dry cough. He has been taking Delsym for his cough which has helped.  His myeloma studies have been stable. He follows up with [facility] next month and states that he will receive his immunizations at that time.  He verbalized that he taking his Revlimid 10 mg PO daily 21 days on and 7 off.  He would like a PT consult for neuropathy.  No fever, chills, n/v, rash, chest pain or changes in bladder habits. He BM's are regular now, no diarrhea. He has had some abdominal cramps after eating that come and go with the revlimid.  When he exercises he sometimes experiences dizziness, tachycardia in the 180's and hoarseness afterwards. He plans to follow-up with his cardiologist about this.  He has had no episodes of bleeding or bruising. No lymphadenopathy found on exam.  He has chronic neck and lower back pain and receives epidural shots periodically.  No swelling or  tenderness in his extremities. The neuropathy in his legs is unchanged.  He is eating well and is trying to stay hydrated. His weight is stable.   Medications:  Allergies as of 03/08/2016      Reactions   Codeine Itching   Hydrocodone-acetaminophen Itching   Mushroom Extract Complex Nausea And Vomiting   Only shitake mushroom  causes this reaction.   Symbicort [budesonide-formoterol Fumarate] Itching      Medication List       Accurate as of 03/08/16  1:31 PM. Always use your most recent med list.          amLODipine 5 MG tablet Commonly known as:  NORVASC TAKE 1 TABLET EVERY DAY   aspirin EC 81 MG tablet Take by mouth.   benzonatate 200 MG capsule Commonly known as:  TESSALON Take 1 capsule (200 mg total) by mouth 3 (three) times daily as needed for cough.   carvedilol 6.25 MG tablet Commonly known as:  COREG TAKE 1 TABLET TWICE DAILY WITH A MEAL   chlorpheniramine-HYDROcodone 10-8 MG/5ML Suer Commonly known as:  TUSSIONEX Take 5 mLs by mouth every 12 (twelve) hours as needed for cough (cough, will cause drowsiness.).   esomeprazole 40 MG capsule Commonly known as:  NEXIUM Take 20 mg by mouth daily at 12 noon.   famciclovir 500 MG tablet Commonly known as:  FAMVIR TAKE 1 TABLET EVERY DAY   gabapentin 600 MG tablet Commonly known as:  NEURONTIN  600 mg in the morning, 1200 mg at night   hydrochlorothiazide 12.5 MG capsule Commonly known as:  MICROZIDE TAKE 1 CAPSULE EVERY DAY   isosorbide mononitrate 30 MG 24 hr tablet Commonly known as:  IMDUR TAKE 1 TABLET TWICE DAILY   lenalidomide 10 MG capsule Commonly known as:  REVLIMID Take daily, days 1-21. Off for 7 days. Repeat every 28 days. [id number]   Loratadine 10 MG Caps Take by mouth.   LORazepam 1 MG tablet Commonly known as:  ATIVAN Take 1 tablet (1 mg total) by mouth at bedtime.   methylPREDNISolone 4 MG Tbpk tablet Commonly known as:  MEDROL DOSEPAK Take per package instructions     montelukast 10 MG tablet Commonly known as:  SINGULAIR TAKE 1 TABLET AT BEDTIME   nitroGLYCERIN 0.4 MG SL tablet Commonly known as:  NITROSTAT Place 0.4 mg under the tongue every 5 (five) minutes as needed. For chest pain   oxyCODONE-acetaminophen 10-325 MG tablet Commonly known as:  PERCOCET Take 1 tablet by mouth daily as needed for pain.   potassium chloride SA 20 MEQ tablet Commonly known as:  K-DUR,KLOR-CON TAKE 1 TABLET TWICE DAILY   pyridoxine 100 MG tablet Commonly known as:  B-6 Take by mouth.   RA VITAMIN B-12 TR 1000 MCG Tbcr Generic drug:  Cyanocobalamin Take by mouth.   rivaroxaban 20 MG Tabs tablet Commonly known as:  XARELTO Take 1 tablet (20 mg total) by mouth daily with supper.       Allergies:  Allergies  Allergen Reactions  . Codeine Itching  . Hydrocodone-Acetaminophen Itching  . Mushroom Extract Complex Nausea And Vomiting    Only shitake mushroom  causes this reaction.  . Symbicort [Budesonide-Formoterol Fumarate] Itching    Past Medical History, Surgical history, Social history, and Family History were reviewed and updated.  Review of Systems: All other 10 point review of systems is negative.   Physical Exam:  weight is 208 lb (94.3 kg). His oral temperature is 97.7 F (36.5 C). His blood pressure is 132/65 and his pulse is 61. His respiration is 17 and oxygen saturation is 98%.   Wt Readings from Last 3 Encounters:  03/08/16 208 lb (94.3 kg)  01/21/16 209 lb (94.8 kg)  12/25/15 211 lb (95.7 kg)    Ocular: Sclerae unicteric, pupils equal, round and reactive to light Ear-nose-throat: Oropharynx clear, dentition fair Lymphatic: No cervical supraclavicular or axillary adenopathy Lungs no rales or rhonchi, good excursion bilaterally Heart regular rate and rhythm, no murmur appreciated Abd soft, nontender, positive bowel sounds, no liver or spleen tip palpated on exam, no fluid wave MSK no focal spinal tenderness, no joint  edema Neuro: non-focal, well-oriented, appropriate affect Breasts: Deferred  Lab Results  Component Value Date   WBC 3.7 (L) 03/08/2016   HGB 12.1 (L) 03/08/2016   HCT 36.8 (L) 03/08/2016   MCV 87 03/08/2016   PLT 208 03/08/2016   Lab Results  Component Value Date   FERRITIN 53 02/19/2013   IRON 45 02/19/2013   TIBC 320 02/19/2013   UIBC 275 02/19/2013   IRONPCTSAT 14 (L) 02/19/2013   Lab Results  Component Value Date   RBC 4.24 03/08/2016   Lab Results  Component Value Date   KPAFRELGTCHN 1.36 02/11/2015   LAMBDASER 1.09 02/11/2015   KAPLAMBRATIO 1.12 01/21/2016   Lab Results  Component Value Date   IGGSERUM 929 01/21/2016   IGA 64 (L) 02/11/2015   IGMSERUM 24 01/21/2016   Lab Results  Component Value  Date   TOTALPROTELP 6.0 (L) 02/11/2015   ALBUMINELP 3.2 (L) 02/11/2015   A1GS 0.4 (H) 02/11/2015   A2GS 0.9 02/11/2015   BETS 0.5 02/11/2015   BETA2SER 0.3 02/11/2015   GAMS 0.6 (L) 02/11/2015   MSPIKE Not Observed 01/21/2016   SPEI * 02/11/2015     Chemistry      Component Value Date/Time   NA 140 03/08/2016 1251   NA 141 06/05/2015 1036   K 3.6 03/08/2016 1251   K 2.9 (LL) 06/05/2015 1036   CL 103 03/08/2016 1251   CO2 29 03/08/2016 1251   CO2 27 06/05/2015 1036   BUN 10 03/08/2016 1251   BUN 13.1 06/05/2015 1036   CREATININE 1.1 03/08/2016 1251   CREATININE 1.0 06/05/2015 1036      Component Value Date/Time   CALCIUM 8.8 03/08/2016 1251   CALCIUM 9.4 06/05/2015 1036   ALKPHOS 99 (H) 03/08/2016 1251   ALKPHOS 94 06/05/2015 1036   AST 27 03/08/2016 1251   AST 22 06/05/2015 1036   ALT 35 03/08/2016 1251   ALT 18 06/05/2015 1036   BILITOT 0.80 03/08/2016 1251   BILITOT 0.59 06/05/2015 1036     Impression and Plan: Mr. Leang is a 76 yo male with history of IgG kappa myeloma. He has completed 5 cycles of chemotherapy and is doing well now on Revlimid. He continues to be seen at [facility] will follow-up with them again next month. He had a fall down  an escalator and then developed bronchitis with acute exacerbation of COPD while on his recent 3 week vacation in [place]. Thankfully has was not seriously injured and was treated there. He is now home and feeling better. He is still fatigued and weak and has a dry cough that continues to improve.  In December, his M-spike was not detected and light chains were stable. His myeloma studies today are pending.  He will continue on his same regimen with Revlimid.  We will plan to see him back in 1 month for follow-up.  He will contact us with any questions or concerns. We can certainly see her sooner if need be.   Eliezer Bottom, NP 1/23/20181:31 PM

## 2016-03-09 LAB — KAPPA/LAMBDA LIGHT CHAINS
IG KAPPA FREE LIGHT CHAIN: 25.5 mg/L — AB (ref 3.3–19.4)
IG LAMBDA FREE LIGHT CHAIN: 21.4 mg/L (ref 5.7–26.3)
KAPPA/LAMBDA FLC RATIO: 1.19 (ref 0.26–1.65)

## 2016-03-09 LAB — IGG, IGA, IGM
IgA, Qn, Serum: 101 mg/dL (ref 61–437)
IgG, Qn, Serum: 751 mg/dL (ref 700–1600)
IgM, Qn, Serum: 22 mg/dL (ref 15–143)

## 2016-03-10 LAB — PROTEIN ELECTROPHORESIS, SERUM, WITH REFLEX
A/G RATIO SPE: 1 (ref 0.7–1.7)
ALBUMIN: 3.2 g/dL (ref 2.9–4.4)
Alpha 1: 0.3 g/dL (ref 0.0–0.4)
Alpha 2: 1 g/dL (ref 0.4–1.0)
BETA: 1.1 g/dL (ref 0.7–1.3)
GAMMA GLOBULIN: 0.8 g/dL (ref 0.4–1.8)
GLOBULIN, TOTAL: 3.1 g/dL (ref 2.2–3.9)
TOTAL PROTEIN: 6.3 g/dL (ref 6.0–8.5)

## 2016-03-14 ENCOUNTER — Other Ambulatory Visit: Payer: Self-pay | Admitting: Hematology & Oncology

## 2016-03-14 ENCOUNTER — Encounter: Payer: Self-pay | Admitting: Sports Medicine

## 2016-03-14 DIAGNOSIS — I25119 Atherosclerotic heart disease of native coronary artery with unspecified angina pectoris: Secondary | ICD-10-CM

## 2016-03-14 MED ORDER — LENALIDOMIDE 10 MG PO CAPS: ORAL_CAPSULE | ORAL | 0 refills | Status: DC

## 2016-03-15 ENCOUNTER — Other Ambulatory Visit: Payer: Self-pay | Admitting: Sports Medicine

## 2016-03-15 ENCOUNTER — Other Ambulatory Visit: Payer: Self-pay | Admitting: Hematology & Oncology

## 2016-03-21 NOTE — Progress Notes (Signed)
HPI: 76 year old male for evaluation of coronary artery disease. Previously followed at [facility]. Last cardiac catheterization 1/16 showed significant proximal RCA disease, occluded circumflex that filled from left to left collaterals and nonobstructive disease in the LAD. Patient had PCI of the right coronary artery with a drug-eluting stent. Last echocardiogram January 2017 at [facility] showed normal LV systolic function with mild left ventricular hypertrophy. No significant valvular disease. Patient has also had recurrent pulmonary emboli in the past and is on chronic xarelto. Patient traveled to [place] in November and developed bronchitis. He has had chest tightness since that time. The tightness increases with activities but also with cough and inspiration. Some radiation to left shoulder. He also notes increased dyspnea. No palpitations or syncope.  Current Outpatient Prescriptions  Medication Sig Dispense Refill  . amLODipine (NORVASC) 5 MG tablet Take 5 mg by mouth daily.    Marland Kitchen aspirin EC 81 MG tablet Take 81 mg by mouth daily.     . carvedilol (COREG) 6.25 MG tablet Take 6.25 mg by mouth 2 (two) times daily with a meal.    . chlorpheniramine-HYDROcodone (TUSSIONEX) 10-8 MG/5ML SUER Take 5 mLs by mouth every 12 (twelve) hours as needed for cough (cough, will cause drowsiness.). 120 mL 0  . Cyanocobalamin (RA VITAMIN B-12 TR) 1000 MCG TBCR Take 1 tablet by mouth daily.     Marland Kitchen esomeprazole (NEXIUM) 20 MG capsule Take 20 mg by mouth daily at 12 noon.    . famciclovir (FAMVIR) 500 MG tablet Take 500 mg by mouth daily.    Marland Kitchen gabapentin (NEURONTIN) 600 MG tablet Take 600 mg by mouth daily each morning. Take 1.200 mg by mouth daily at bedtime.    . hydrochlorothiazide (MICROZIDE) 12.5 MG capsule Take 12.5 mg by mouth daily.    . isosorbide mononitrate (IMDUR) 30 MG 24 hr tablet Take 30 mg by mouth 2 (two) times daily.    Marland Kitchen lenalidomide (REVLIMID) 10 MG capsule Take daily, days 1-21. Off for 7  days. Repeat every 28 days. EZMO#[id number] 21 capsule 0  . Loratadine 10 MG CAPS Take 10 mg by mouth daily as needed (allergies).     . LORazepam (ATIVAN) 1 MG tablet Take 0.5 mg by mouth at bedtime.    . montelukast (SINGULAIR) 10 MG tablet Take 10 mg by mouth at bedtime.    . nitroGLYCERIN (NITROSTAT) 0.4 MG SL tablet Place 0.4 mg under the tongue every 5 (five) minutes as needed. For chest pain    . oxyCODONE-acetaminophen (PERCOCET) 10-325 MG tablet Take 1 tablet by mouth daily as needed for pain. 30 tablet 0  . potassium chloride SA (K-DUR,KLOR-CON) 20 MEQ tablet Take 20 mEq by mouth 2 (two) times daily.    Marland Kitchen pyridoxine (B-6) 100 MG tablet Take 100 mg by mouth daily.     . rivaroxaban (XARELTO) 20 MG TABS tablet Take 1 tablet (20 mg total) by mouth daily with supper. 90 tablet 2   No current facility-administered medications for this visit.     Allergies  Allergen Reactions  . Codeine Itching  . Hydrocodone-Acetaminophen Itching  . Mushroom Extract Complex Nausea And Vomiting    Only shitake mushroom  causes this reaction.  . Symbicort [Budesonide-Formoterol Fumarate] Itching     Past Medical History:  Diagnosis Date  . Acute deep vein thrombosis (DVT) of tibial vein of right lower extremity ([facility]) 09/29/2014  . Arthritis    back  . Asthma    uses Advair bid and  asmanex prn  . Cancer (HCC)    multiple myeloma  . Chronic back pain    buldging disc and scoliosis  . Chronic bronchitis (HCC)    couple of yrs ago  . Complication of anesthesia    severe case of hiccups after hernia surgery;required medication  . Diverticulosis   . Dizziness    related to neck issues  . Emphysema   . Enlarged prostate    takes Flomax daily  . GERD (gastroesophageal reflux disease)    takes PRotonix daily  . History of colon polyps   . Hyperlipidemia    takes Zocor daily  . Hypertension    takes HCTZ,Diovan daily and Isosorbide daily  . Itching    down left arm and leg  . Joint pain   .  Macular degeneration    mild,beginning  . Multiple myeloma (HCC) 08/13/2014  . Myocardial infarction 2010  . Neck pain   . Pain in limb 06/20/2012  . Pneumonia    hx of---12+yrs ago    Past Surgical History:  Procedure Laterality Date  . ANTERIOR CERVICAL DECOMP/DISCECTOMY FUSION  01/31/2012   Procedure: ANTERIOR CERVICAL DECOMPRESSION/DISCECTOMY FUSION 1 LEVEL;  Surgeon: Karn Cassis, MD;  Location: [facility];  Service: Neurosurgery;  Laterality: N/A;  Cervical Five-Six  Anterior Cervical Decompression /Diskectomy /Fusion/Plate  . BONE MARROW TRANSPLANT    . CARDIAC CATHETERIZATION  2010/2013   06/07/11 [facility]) LAD 25%, D2 75% (small), pLCx 100%, RCA 25%--medical managment   . COLONOSCOPY    . HERNIA REPAIR  2006   x 3   . salivary gland removed     left   . skin cancer removed from left side of face     basal cell carcinoma  . vein removed from left leg      Social History   Social History  . Marital status: Married    Spouse name: N/A  . Number of children: 3  . Years of education: N/A   Occupational History  . Not on file.   Social History Main Topics  . Smoking status: Former Smoker    Years: 30.00    Types: Pipe    Quit date: 11/24/2011  . Smokeless tobacco: Former Neurosurgeon    Quit date: 02/15/2011     Comment: quit tobacco 3 years ago  . Alcohol use 0.0 oz/week     Comment: 1-2 glasses of wine daily  . Drug use: No  . Sexual activity: Yes   Other Topics Concern  . Not on file   Social History Narrative  . No narrative on file    Family History  Problem Relation Age of Onset  . Hypertension Mother   . Heart attack Father   . Cancer Sister   . Cancer Sister     ROS: Recent bronchitis but  no fevers or chills, hemoptysis, dysphasia, odynophagia, melena, hematochezia, dysuria, hematuria, rash, seizure activity, orthopnea, PND, pedal edema, claudication. Remaining systems are negative.  Physical Exam:   Blood pressure 132/65, pulse 75, height 6'  (1.829 m), weight 210 lb 9.6 oz (95.5 kg).  General:  Well developed/well nourished in NAD Skin warm/dry Patient not depressed No peripheral clubbing Back-normal HEENT-normal/normal eyelids Neck supple/normal carotid upstroke bilaterally; no JVD; no thyromegaly chest - CTA/ normal expansion CV - RRR/normal S1 and S2; no murmurs, rubs or gallops;  PMI nondisplaced Abdomen -NT/ND, no HSM, no mass, + bowel sounds, no bruit 2+ femoral pulses, no bruits Ext-no edema, chords, 2+ DP  Neuro-grossly nonfocal  ECG -sinus rhythm at a rate of 75. Right bundle branch block.  A/P  1 CAD-continue aspirin. Patient is not on a statin. He thinks this was discontinued at [facility] because of potential interaction with medication for his myeloma. I have asked him to discuss this with him and would like for him to be on a statin long-term if possible.  2 Recurrent pulmonary emboli-patient is on xarelto 10 mg daily. He recently traveled to [place] and is describing increased dyspnea. He has a history of recurrent pulmonary emboli. Check d-dimer. If elevated he would require CT scan and if pulmonary embolus demonstrated would need to increase dose.  3 Hyperlipidemia-as above he will discuss whether he can be on a statin with his oncologist at [facility].  4 Hypertension-blood pressure controlled. Continue present medications.  5 Multiple myeloma-management per oncology.  6 chest tightness-symptoms seem more consistent with bronchitis as they increase with cough and inspiration. However also some pain with activities. I will arrange a [facility] nuclear study risk stratification.  Kirk Ruths, MD

## 2016-03-25 ENCOUNTER — Ambulatory Visit (HOSPITAL_BASED_OUTPATIENT_CLINIC_OR_DEPARTMENT_OTHER): Payer: Medicare Other

## 2016-03-25 ENCOUNTER — Ambulatory Visit: Payer: Medicare Other | Admitting: Hematology & Oncology

## 2016-03-25 ENCOUNTER — Ambulatory Visit: Payer: Medicare Other

## 2016-03-25 ENCOUNTER — Other Ambulatory Visit (HOSPITAL_BASED_OUTPATIENT_CLINIC_OR_DEPARTMENT_OTHER): Payer: Medicare Other

## 2016-03-25 VITALS — BP 142/63 | HR 61 | Temp 97.6°F | Resp 17

## 2016-03-25 DIAGNOSIS — C9001 Multiple myeloma in remission: Secondary | ICD-10-CM

## 2016-03-25 DIAGNOSIS — C9000 Multiple myeloma not having achieved remission: Secondary | ICD-10-CM

## 2016-03-25 DIAGNOSIS — M899 Disorder of bone, unspecified: Secondary | ICD-10-CM

## 2016-03-25 LAB — CMP (CANCER CENTER ONLY)
ALBUMIN: 3.5 g/dL (ref 3.3–5.5)
ALT(SGPT): 31 U/L (ref 10–47)
AST: 29 U/L (ref 11–38)
Alkaline Phosphatase: 97 U/L — ABNORMAL HIGH (ref 26–84)
BILIRUBIN TOTAL: 0.9 mg/dL (ref 0.20–1.60)
BUN, Bld: 11 mg/dL (ref 7–22)
CALCIUM: 8.8 mg/dL (ref 8.0–10.3)
CHLORIDE: 100 meq/L (ref 98–108)
CO2: 30 meq/L (ref 18–33)
Creat: 1.1 mg/dl (ref 0.6–1.2)
Glucose, Bld: 77 mg/dL (ref 73–118)
Potassium: 3.2 mEq/L — ABNORMAL LOW (ref 3.3–4.7)
Sodium: 143 mEq/L (ref 128–145)
Total Protein: 7 g/dL (ref 6.4–8.1)

## 2016-03-25 LAB — CBC WITH DIFFERENTIAL (CANCER CENTER ONLY)
BASO#: 0.1 10*3/uL (ref 0.0–0.2)
BASO%: 2.9 % — ABNORMAL HIGH (ref 0.0–2.0)
EOS ABS: 0.1 10*3/uL (ref 0.0–0.5)
EOS%: 2 % (ref 0.0–7.0)
HEMATOCRIT: 38.5 % — AB (ref 38.7–49.9)
HEMOGLOBIN: 12.6 g/dL — AB (ref 13.0–17.1)
LYMPH#: 1.8 10*3/uL (ref 0.9–3.3)
LYMPH%: 41.2 % (ref 14.0–48.0)
MCH: 28.4 pg (ref 28.0–33.4)
MCHC: 32.7 g/dL (ref 32.0–35.9)
MCV: 87 fL (ref 82–98)
MONO#: 0.7 10*3/uL (ref 0.1–0.9)
MONO%: 15.8 % — ABNORMAL HIGH (ref 0.0–13.0)
NEUT%: 38.1 % — ABNORMAL LOW (ref 40.0–80.0)
NEUTROS ABS: 1.7 10*3/uL (ref 1.5–6.5)
Platelets: 225 10*3/uL (ref 145–400)
RBC: 4.44 10*6/uL (ref 4.20–5.70)
RDW: 17.1 % — ABNORMAL HIGH (ref 11.1–15.7)
WBC: 4.4 10*3/uL (ref 4.0–10.0)

## 2016-03-25 MED ORDER — ZOLEDRONIC ACID 4 MG/100ML IV SOLN
4.0000 mg | Freq: Once | INTRAVENOUS | Status: AC
  Administered 2016-03-25: 4 mg via INTRAVENOUS
  Filled 2016-03-25: qty 100

## 2016-03-25 NOTE — Patient Instructions (Signed)

## 2016-03-28 ENCOUNTER — Encounter: Payer: Self-pay | Admitting: Cardiology

## 2016-03-28 ENCOUNTER — Ambulatory Visit (INDEPENDENT_AMBULATORY_CARE_PROVIDER_SITE_OTHER): Payer: Medicare Other | Admitting: Cardiology

## 2016-03-28 VITALS — BP 132/65 | HR 75 | Ht 72.0 in | Wt 210.6 lb

## 2016-03-28 DIAGNOSIS — R072 Precordial pain: Secondary | ICD-10-CM | POA: Diagnosis not present

## 2016-03-28 DIAGNOSIS — I10 Essential (primary) hypertension: Secondary | ICD-10-CM

## 2016-03-28 DIAGNOSIS — E7800 Pure hypercholesterolemia, unspecified: Secondary | ICD-10-CM | POA: Diagnosis not present

## 2016-03-28 DIAGNOSIS — I2510 Atherosclerotic heart disease of native coronary artery without angina pectoris: Secondary | ICD-10-CM | POA: Diagnosis not present

## 2016-03-28 NOTE — Addendum Note (Signed)
Addended by: Cristopher Estimable on: 03/28/2016 10:04 AM   Modules accepted: Orders

## 2016-03-28 NOTE — Patient Instructions (Addendum)
Medication Instructions:   NO CHANGE  Labwork:  Your physician recommends that you HAVE LAB WORK TODAY=[facility]=[address] [place]  Testing/Procedures:  Your physician has requested that you have a lexiscan myoview. For further information please visit [email or web address]. Please follow instruction sheet, as given.    Follow-Up:  Your physician recommends that you schedule a follow-up appointment in: [place]

## 2016-03-29 ENCOUNTER — Ambulatory Visit (INDEPENDENT_AMBULATORY_CARE_PROVIDER_SITE_OTHER): Payer: Medicare Other | Admitting: Sports Medicine

## 2016-03-29 ENCOUNTER — Telehealth: Payer: Self-pay | Admitting: Cardiology

## 2016-03-29 ENCOUNTER — Ambulatory Visit (HOSPITAL_BASED_OUTPATIENT_CLINIC_OR_DEPARTMENT_OTHER)
Admission: RE | Admit: 2016-03-29 | Discharge: 2016-03-29 | Disposition: A | Discharge status: Home / Self Care | Payer: Medicare Other | Source: Ambulatory Visit | Attending: Cardiology | Admitting: Cardiology

## 2016-03-29 ENCOUNTER — Encounter: Payer: Self-pay | Admitting: Sports Medicine

## 2016-03-29 ENCOUNTER — Encounter (HOSPITAL_BASED_OUTPATIENT_CLINIC_OR_DEPARTMENT_OTHER): Payer: Self-pay

## 2016-03-29 ENCOUNTER — Other Ambulatory Visit: Payer: Self-pay | Admitting: *Deleted

## 2016-03-29 DIAGNOSIS — R109 Unspecified abdominal pain: Secondary | ICD-10-CM

## 2016-03-29 DIAGNOSIS — J984 Other disorders of lung: Secondary | ICD-10-CM | POA: Diagnosis not present

## 2016-03-29 DIAGNOSIS — I2510 Atherosclerotic heart disease of native coronary artery without angina pectoris: Secondary | ICD-10-CM

## 2016-03-29 DIAGNOSIS — R079 Chest pain, unspecified: Secondary | ICD-10-CM | POA: Diagnosis not present

## 2016-03-29 DIAGNOSIS — R10A1 Flank pain, right side: Secondary | ICD-10-CM

## 2016-03-29 DIAGNOSIS — J449 Chronic obstructive pulmonary disease, unspecified: Secondary | ICD-10-CM

## 2016-03-29 DIAGNOSIS — I2583 Coronary atherosclerosis due to lipid rich plaque: Secondary | ICD-10-CM

## 2016-03-29 DIAGNOSIS — J4489 Other specified chronic obstructive pulmonary disease: Secondary | ICD-10-CM

## 2016-03-29 DIAGNOSIS — R911 Solitary pulmonary nodule: Secondary | ICD-10-CM | POA: Diagnosis not present

## 2016-03-29 DIAGNOSIS — R7989 Other specified abnormal findings of blood chemistry: Secondary | ICD-10-CM | POA: Diagnosis present

## 2016-03-29 LAB — POCT URINALYSIS DIPSTICK
Bilirubin, UA: NEGATIVE
Glucose, UA: NEGATIVE
Ketones, UA: NEGATIVE
Leukocytes, UA: NEGATIVE
Nitrite, UA: NEGATIVE
Protein, UA: NEGATIVE
Spec Grav, UA: 1.01
Urobilinogen, UA: 0.2
pH, UA: 6.5

## 2016-03-29 LAB — D-DIMER, QUANTITATIVE: D-DIMER: 0.59 mg/L FEU — ABNORMAL HIGH (ref 0.00–0.49)

## 2016-03-29 MED ORDER — IPRATROPIUM-ALBUTEROL 20-100 MCG/ACT IN AERS: 1.0000 | INHALATION_SPRAY | Freq: Four times a day (QID) | RESPIRATORY_TRACT | 5 refills | Status: DC | PRN

## 2016-03-29 MED ORDER — LEVOFLOXACIN 750 MG PO TABS: 750.0000 mg | ORAL_TABLET | Freq: Every day | ORAL | 0 refills | Status: DC

## 2016-03-29 MED ORDER — IOPAMIDOL (ISOVUE-370) INJECTION 76%
100.0000 mL | Freq: Once | INTRAVENOUS | Status: AC | PRN
  Administered 2016-03-29: 100 mL via INTRAVENOUS

## 2016-03-29 MED ORDER — LENALIDOMIDE 10 MG PO CAPS: ORAL_CAPSULE | ORAL | 0 refills | Status: DC

## 2016-03-29 MED ORDER — PREDNISONE 50 MG PO TABS: 50.0000 mg | ORAL_TABLET | Freq: Every day | ORAL | 0 refills | Status: DC

## 2016-03-29 MED ORDER — TAMSULOSIN HCL 0.4 MG PO CAPS: 0.4000 mg | ORAL_CAPSULE | Freq: Every day | ORAL | 3 refills | Status: DC

## 2016-03-29 NOTE — Assessment & Plan Note (Signed)
Nuclear stress test coming up at the end of the week.

## 2016-03-29 NOTE — Progress Notes (Signed)
  Subjective:    CC: Chest/abdominal pain  HPI: For the past couple of days this pleasant 76 year old male with a complex medical history has had shortness of breath, as well as a right flank to groin abdominal pain. Denies any overt hematuria. He did have a elevated d-dimer and a CT angiogram was ordered by his cardiologist, it was negative with the exception of a few new pulmonary nodules, as well as some atelectasis. No constitutional symptoms, on further questioning he localizes his pain today, which he admits he did not mention to Dr. Stanford Breed, from the right flank radiating to the right groin.  Past medical history:  Negative.  See flowsheet/record as well for more information.  Surgical history: Negative.  See flowsheet/record as well for more information.  Family history: Negative.  See flowsheet/record as well for more information.  Social history: Negative.  See flowsheet/record as well for more information.  Allergies, and medications have been entered into the medical record, reviewed, and no changes needed.   Review of Systems: No fevers, chills, night sweats, weight loss, chest pain, or shortness of breath.   Objective:    General: Well Developed, well nourished, and in no acute distress.  Neuro: Alert and oriented x3, extra-ocular muscles intact, sensation grossly intact.  HEENT: Normocephalic, atraumatic, pupils equal round reactive to light, neck supple, no masses, no lymphadenopathy, thyroid nonpalpable.  Skin: Warm and dry, no rashes. Cardiac: Regular rate and rhythm, no murmurs rubs or gallops, no lower extremity edema.  Respiratory: Coarse sounds with diffuse wheezing in the right lower lobe. Not using accessory muscles, speaking in full sentences. Abdomen: Soft, tender palpation in the right upper quadrant and right lumbar region, mild right costovertebral angle pain. No guarding, no rebound tenderness, no rigidity.  Urinalysis is positive for blood  Impression and  Recommendations:    Asthma with COPD With some coarse sounds with expiratory wheezes in the right lower lobe. Prednisone, azithromycin. No chest x-ray needed, he did just have a contrast CT of the chest ordered by Dr. Stanford Breed that was negative for a PE.  There were some pulmonary nodules that can be followed up by pulmonology.  Right flank pain Normal renal ultrasound in the distant past. Right loin to groin pain. He did have a single episode of urinary incontinence. I would like to add a urine culture. Checking blood work, which he will need to return tomorrow for. I would like a renal protocol CT done at [facility], he will also strain his urine. Adding Flomax.  Coronary artery disease Nuclear stress test coming up at the end of the week.  Solitary pulmonary nodule New pulmonary nodule noted on recent CT angiogram of the chest. I would like him to follow-up with Dr. Alva/pulmonology regarding this.  I spent 40 minutes with this patient, greater than 50% was face-to-face time counseling regarding the above diagnoses

## 2016-03-29 NOTE — Telephone Encounter (Signed)
Pt returning call to Debra--pls call back

## 2016-03-29 NOTE — Telephone Encounter (Signed)
Received call from [place]: No evidence of pulmonary emboli.  Slightly smaller left lower lobe nodule consistent with a benign etiology.  Stable scarring in the left lung base.  Mild right basilar and right upper lobe atelectatic changes.  New 9 mm nodule in the right upper lobe as described. Consider one of the following in 3 months for both low-risk and high-risk individuals: (a) repeat chest CT, (b) follow-up PET-CT, or (c) tissue sampling. This recommendation follows the consensus statement: Guidelines for Management of Incidental Pulmonary Nodules Detected on CT Images: From the Fleischner Society 2017; Radiology 2017; 284:228-243.  Radiology letting patient go with report of no PE only   Will forward to Dr Stanford Breed for review

## 2016-03-29 NOTE — Assessment & Plan Note (Addendum)
Normal renal ultrasound in the distant past. Right loin to groin pain. He did have a single episode of urinary incontinence. I would like to add a urine culture. Checking blood work, which he will need to return tomorrow for. I would like a renal protocol CT done at [facility] [place], he will also strain his urine. Adding Flomax.

## 2016-03-29 NOTE — Telephone Encounter (Signed)
I spoke w patient and communicated recommendations from provider due to his elevated D-Dimer. Pt voiced understanding. We discussed, he prefers to have this CT done at [facility] but will take soonest available elsewhere w/ understanding that having this test done as soon as possible is preferred. I've reached out to schedulers to help me get this arranged.

## 2016-03-29 NOTE — Telephone Encounter (Signed)
Would arrange pulmonary evaluation for nodule [name] [name]

## 2016-03-29 NOTE — Assessment & Plan Note (Signed)
With some coarse sounds with expiratory wheezes in the right lower lobe. Prednisone, azithromycin. No chest x-ray needed, he did just have a contrast CT of the chest ordered by Dr. Stanford Breed that was negative for a PE.  There were some pulmonary nodules that can be followed up by pulmonology.

## 2016-03-29 NOTE — Assessment & Plan Note (Signed)
New pulmonary nodule noted on recent CT angiogram of the chest. I would like him to follow-up with Dr. Alva/pulmonology regarding this.

## 2016-03-29 NOTE — Telephone Encounter (Signed)
Notes Recorded by Lelon Perla, MD on 03/29/2016 at 7:45 AM EST Schedule CTA to exclude pulmonary embolus Kirk Ruths

## 2016-03-29 NOTE — Telephone Encounter (Signed)
Follow up      Calling to give CT report

## 2016-03-29 NOTE — Telephone Encounter (Signed)
Deedie spoke to [place] at [place], patient can come this AM for test as stat add-in. I called patient to communicate this, he voiced understanding. Imaging dept needed to have contact to call results, advised to call to [facility] office and speak to Dr. Oval Linsey ([facility]).

## 2016-03-30 ENCOUNTER — Ambulatory Visit: Payer: Medicare Other | Admitting: Sports Medicine

## 2016-03-30 ENCOUNTER — Telehealth: Payer: Self-pay | Admitting: Sports Medicine

## 2016-03-30 ENCOUNTER — Ambulatory Visit (INDEPENDENT_AMBULATORY_CARE_PROVIDER_SITE_OTHER): Payer: Medicare Other

## 2016-03-30 ENCOUNTER — Telehealth (HOSPITAL_COMMUNITY): Payer: Self-pay

## 2016-03-30 ENCOUNTER — Encounter: Payer: Self-pay | Admitting: Sports Medicine

## 2016-03-30 DIAGNOSIS — R109 Unspecified abdominal pain: Secondary | ICD-10-CM

## 2016-03-30 DIAGNOSIS — K449 Diaphragmatic hernia without obstruction or gangrene: Secondary | ICD-10-CM | POA: Diagnosis not present

## 2016-03-30 DIAGNOSIS — J479 Bronchiectasis, uncomplicated: Secondary | ICD-10-CM | POA: Diagnosis not present

## 2016-03-30 DIAGNOSIS — M4807 Spinal stenosis, lumbosacral region: Secondary | ICD-10-CM | POA: Diagnosis not present

## 2016-03-30 DIAGNOSIS — I700 Atherosclerosis of aorta: Secondary | ICD-10-CM | POA: Diagnosis not present

## 2016-03-30 LAB — CBC AND DIFFERENTIAL
HCT: 35 % — AB (ref 41–53)
HEMOGLOBIN: 11.7 g/dL — AB (ref 13.5–17.5)
PLATELETS: 261 10*3/uL (ref 150–399)
WBC: 7.6 10^3/mL

## 2016-03-30 LAB — BASIC METABOLIC PANEL
BUN: 15 mg/dL (ref 4–21)
CREATININE: 1.4 mg/dL — AB (ref 0.6–1.3)
Glucose: 152 mg/dL
Potassium: 3.9 mmol/L (ref 3.4–5.3)
Sodium: 141 mmol/L (ref 137–147)

## 2016-03-30 LAB — HEPATIC FUNCTION PANEL
ALK PHOS: 101 U/L (ref 25–125)
ALT: 23 U/L (ref 10–40)
AST: 17 U/L (ref 14–40)
Bilirubin, Total: 0.5 mg/dL

## 2016-03-30 NOTE — Telephone Encounter (Signed)
Spoke with pt, the CT scan was discussed with his PCP and he has an appointment with pulmonary to follow up lung nodule.

## 2016-03-30 NOTE — Telephone Encounter (Signed)
none

## 2016-03-30 NOTE — Telephone Encounter (Signed)
Close encounter 

## 2016-03-31 ENCOUNTER — Telehealth: Payer: Self-pay | Admitting: Cardiology

## 2016-03-31 ENCOUNTER — Encounter: Payer: Self-pay | Admitting: Sports Medicine

## 2016-03-31 ENCOUNTER — Encounter: Payer: Self-pay | Admitting: Cardiology

## 2016-03-31 LAB — URINE CULTURE: Organism ID, Bacteria: NO GROWTH

## 2016-03-31 NOTE — Telephone Encounter (Signed)
Spoke with pt, he has found out he has pneumonia and does not want to come out tomorrow. Nuclear test canceled and patient will call back to reschedule.

## 2016-03-31 NOTE — Telephone Encounter (Signed)
New message    Pt calling for Hilda Blades to call him back.

## 2016-03-31 NOTE — Telephone Encounter (Signed)
[name] [name] is calling, states he had a CT scan and the results looked like the chest pain is coming from lung condition. Patient would like to know if he needs to have stress test done. Please call, thanks.

## 2016-03-31 NOTE — Telephone Encounter (Signed)
Spoke with pt, he feels the pain he is having is due to his current lung and kidney issues. He is trying to figure out if he needs a stress test. Explained to the patient it will help with on going treatment to r/o the heart as a problem. He agreed and will go ahead with the test and is aware and understands instructions prior to testing.

## 2016-04-01 ENCOUNTER — Inpatient Hospital Stay (HOSPITAL_COMMUNITY): Admission: RE | Admit: 2016-04-01 | Payer: Medicare Other | Source: Ambulatory Visit

## 2016-04-01 ENCOUNTER — Other Ambulatory Visit: Payer: Self-pay | Admitting: Hematology & Oncology

## 2016-04-04 DIAGNOSIS — I10 Essential (primary) hypertension: Secondary | ICD-10-CM | POA: Diagnosis not present

## 2016-04-04 DIAGNOSIS — Z79899 Other long term (current) drug therapy: Secondary | ICD-10-CM | POA: Diagnosis not present

## 2016-04-04 DIAGNOSIS — Z86711 Personal history of pulmonary embolism: Secondary | ICD-10-CM | POA: Diagnosis not present

## 2016-04-04 DIAGNOSIS — K449 Diaphragmatic hernia without obstruction or gangrene: Secondary | ICD-10-CM | POA: Diagnosis not present

## 2016-04-04 DIAGNOSIS — C9000 Multiple myeloma not having achieved remission: Secondary | ICD-10-CM | POA: Diagnosis not present

## 2016-04-04 DIAGNOSIS — Z23 Encounter for immunization: Secondary | ICD-10-CM | POA: Diagnosis not present

## 2016-04-04 DIAGNOSIS — Z955 Presence of coronary angioplasty implant and graft: Secondary | ICD-10-CM | POA: Diagnosis not present

## 2016-04-04 DIAGNOSIS — Z9989 Dependence on other enabling machines and devices: Secondary | ICD-10-CM | POA: Diagnosis not present

## 2016-04-04 DIAGNOSIS — Z9484 Stem cells transplant status: Secondary | ICD-10-CM | POA: Diagnosis not present

## 2016-04-04 DIAGNOSIS — I2510 Atherosclerotic heart disease of native coronary artery without angina pectoris: Secondary | ICD-10-CM | POA: Diagnosis not present

## 2016-04-04 DIAGNOSIS — G4733 Obstructive sleep apnea (adult) (pediatric): Secondary | ICD-10-CM | POA: Diagnosis not present

## 2016-04-05 ENCOUNTER — Encounter: Payer: Self-pay | Admitting: Sports Medicine

## 2016-04-05 ENCOUNTER — Ambulatory Visit (INDEPENDENT_AMBULATORY_CARE_PROVIDER_SITE_OTHER): Payer: Medicare Other | Admitting: Sports Medicine

## 2016-04-05 DIAGNOSIS — J449 Chronic obstructive pulmonary disease, unspecified: Secondary | ICD-10-CM | POA: Diagnosis not present

## 2016-04-05 DIAGNOSIS — J4489 Other specified chronic obstructive pulmonary disease: Secondary | ICD-10-CM

## 2016-04-05 DIAGNOSIS — N281 Cyst of kidney, acquired: Secondary | ICD-10-CM | POA: Diagnosis not present

## 2016-04-05 DIAGNOSIS — I2510 Atherosclerotic heart disease of native coronary artery without angina pectoris: Secondary | ICD-10-CM | POA: Diagnosis not present

## 2016-04-05 DIAGNOSIS — I2583 Coronary atherosclerosis due to lipid rich plaque: Secondary | ICD-10-CM

## 2016-04-05 NOTE — Progress Notes (Signed)
  Subjective:    CC: Follow-up  HPI:  This is a pleasant 76 year old male, he returns feeling much better after steroids and antibiotics. Shortness of breath and flank pain have resolved.  Past medical history:  Negative.  See flowsheet/record as well for more information.  Surgical history: Negative.  See flowsheet/record as well for more information.  Family history: Negative.  See flowsheet/record as well for more information.  Social history: Negative.  See flowsheet/record as well for more information.  Allergies, and medications have been entered into the medical record, reviewed, and no changes needed.   Review of Systems: No fevers, chills, night sweats, weight loss, chest pain, or shortness of breath.   Objective:    General: Well Developed, well nourished, and in no acute distress.  Neuro: Alert and oriented x3, extra-ocular muscles intact, sensation grossly intact.  HEENT: Normocephalic, atraumatic, pupils equal round reactive to light, neck supple, no masses, no lymphadenopathy, thyroid nonpalpable.  Skin: Warm and dry, no rashes. Cardiac: Regular rate and rhythm, no murmurs rubs or gallops, no lower extremity edema.  Respiratory: Clear to auscultation bilaterally. Not using accessory muscles, speaking in full sentences.  Impression and Recommendations:    Asthma with COPD Right flank pain ended up being a right lower lobe pneumonia seen on abdominal CT. This is essentially resolved after antibiotics and steroids, return as needed for this.  Renal cyst His is the likely cause of the hematuria combined with his blood thinner. No further evaluation needed.  I spent 25 minutes with this patient, greater than 50% was face-to-face time counseling regarding the above diagnoses

## 2016-04-05 NOTE — Assessment & Plan Note (Signed)
His is the likely cause of the hematuria combined with his blood thinner. No further evaluation needed.

## 2016-04-05 NOTE — Assessment & Plan Note (Signed)
Right flank pain ended up being a right lower lobe pneumonia seen on abdominal CT. This is essentially resolved after antibiotics and steroids, return as needed for this.

## 2016-04-07 ENCOUNTER — Encounter: Payer: Self-pay | Admitting: Cardiology

## 2016-04-08 ENCOUNTER — Telehealth (HOSPITAL_COMMUNITY): Payer: Self-pay

## 2016-04-08 ENCOUNTER — Other Ambulatory Visit: Payer: Self-pay | Admitting: *Deleted

## 2016-04-08 DIAGNOSIS — I2510 Atherosclerotic heart disease of native coronary artery without angina pectoris: Secondary | ICD-10-CM

## 2016-04-08 NOTE — Telephone Encounter (Signed)
Encounter complete. 

## 2016-04-13 ENCOUNTER — Ambulatory Visit (HOSPITAL_COMMUNITY)
Admission: RE | Admit: 2016-04-13 | Discharge: 2016-04-13 | Disposition: A | Discharge status: Home / Self Care | Payer: Medicare Other | Source: Ambulatory Visit | Attending: Cardiovascular Disease | Admitting: Cardiovascular Disease

## 2016-04-13 DIAGNOSIS — I2510 Atherosclerotic heart disease of native coronary artery without angina pectoris: Secondary | ICD-10-CM

## 2016-04-13 LAB — MYOCARDIAL PERFUSION IMAGING
CHL CUP RESTING HR STRESS: 52 {beats}/min
CSEPPHR: 73 {beats}/min
LV sys vol: 64 mL
LVDIAVOL: 127 mL (ref 62–150)
SDS: 2
SRS: 1
SSS: 3
TID: 1.12

## 2016-04-13 MED ORDER — TECHNETIUM TC 99M TETROFOSMIN IV KIT
10.6000 | PACK | Freq: Once | INTRAVENOUS | Status: AC | PRN
  Administered 2016-04-13: 10.6 via INTRAVENOUS
  Filled 2016-04-13: qty 11

## 2016-04-13 MED ORDER — TECHNETIUM TC 99M TETROFOSMIN IV KIT
31.9000 | PACK | Freq: Once | INTRAVENOUS | Status: AC | PRN
  Administered 2016-04-13: 31.9 via INTRAVENOUS
  Filled 2016-04-13: qty 32

## 2016-04-13 MED ORDER — REGADENOSON 0.4 MG/5ML IV SOLN
0.4000 mg | Freq: Once | INTRAVENOUS | Status: AC
  Administered 2016-04-13: 0.4 mg via INTRAVENOUS

## 2016-04-18 ENCOUNTER — Encounter: Payer: Self-pay | Admitting: Pulmonary Disease

## 2016-04-19 ENCOUNTER — Encounter: Payer: Self-pay | Admitting: Pulmonary Disease

## 2016-04-19 ENCOUNTER — Ambulatory Visit (INDEPENDENT_AMBULATORY_CARE_PROVIDER_SITE_OTHER): Payer: Medicare Other | Admitting: Pulmonary Disease

## 2016-04-19 VITALS — BP 114/68 | HR 62 | Ht 72.0 in | Wt 208.0 lb

## 2016-04-19 DIAGNOSIS — R0600 Dyspnea, unspecified: Secondary | ICD-10-CM | POA: Insufficient documentation

## 2016-04-19 DIAGNOSIS — J121 Respiratory syncytial virus pneumonia: Secondary | ICD-10-CM | POA: Insufficient documentation

## 2016-04-19 DIAGNOSIS — R911 Solitary pulmonary nodule: Secondary | ICD-10-CM

## 2016-04-19 DIAGNOSIS — I2583 Coronary atherosclerosis due to lipid rich plaque: Secondary | ICD-10-CM | POA: Diagnosis not present

## 2016-04-19 DIAGNOSIS — I2510 Atherosclerotic heart disease of native coronary artery without angina pectoris: Secondary | ICD-10-CM | POA: Diagnosis not present

## 2016-04-19 MED ORDER — ALBUTEROL SULFATE HFA 108 (90 BASE) MCG/ACT IN AERS: 2.0000 | INHALATION_SPRAY | Freq: Four times a day (QID) | RESPIRATORY_TRACT | 3 refills | Status: DC | PRN

## 2016-04-19 NOTE — Assessment & Plan Note (Signed)
Obtain CPAP download He feels like pressure needs to be increased, he is currently at 10 cm

## 2016-04-19 NOTE — Progress Notes (Signed)
Subjective:    Patient ID: [name] [name], male    DOB: 07/06/1940, 76 y.o.   MRN: [id number]  HPI  Chief Complaint  Patient presents with  . Pulm Consult    Has a history of COPD and asthma.    76 year old ex-smoker who presents today to establish care. He has been seeing Dr. Michela Pitcher in [place] for PE, OSA and dyspnea that has been attributed to asthma and COPD for many years and would like to transfer care.  He has history of multiple myeloma and underwent bone marrow transplant at [facility] in 03/2015. He has a history of pulmonary embolism in the past and then had a DVT in right lower extremity in 2016 and is now maintained on anticoagulation lifelong with Xarelto-per Dr.) been decreased to 10 mg daily. OSA was diagnosed in 2016 and is being maintained on CPAP of 10 cm with good improvement in his daytime somnolence and fatigue. Lately he feels that pressure is not adequate and wonders if this needs to be increased.  Recent imaging included CT 03/2016 which showed new right upper lobe 9 mm nodule left lower lobe nodule has been stable since 2016  He reports increasing dyspnea on exertion over the past few months. He reports fatigue and occasional dizziness that is worse with exertion. He reports a chest heaviness and underwent a cardiac evaluation. Myocardial perfusion imaging showed normal LV function and low risk for ischemia. It showed increased RV uptake suggestive of elevated pulmonary pressures. I reviewed all his prior testing including records from Dr. Michela Pitcher and [facility] and summaries listed below I personally reviewed all his imaging studies including prior CT scans and PET scan  Significant tests/ events  CT angiogram 03/2016 new right upper lobe 9 mm nodule. Left lower lobe 71m nodule first noted in 08/2014 has decreased to 7 mm.  PFTs 02/2015 ratio 57, FEV1 75%, 2.68, FVC 98%, DLCO 67% 10/2015 moderate airway obstruction  Echo 02/2015 normal LV function, normal RVSP   He quit  smoking in 2013, a pack would last him a month and he probably smoked less than 10 pack years, often used a pipe. 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 Acute deep vein thrombosis (DVT) of tibial vein of right lower extremity ([facility] 09/29/2014  . Arthritis    back  . Asthma    uses Advair bid and asmanex prn  . Cancer ([place]    multiple myeloma  . Chronic back pain    buldging disc and scoliosis  . Chronic [place]itis ([facility]    couple of yrs ago  . Complication of anesthesia    severe case of hiccups after hernia surgery;required medication  . Diverticulosis   . Dizziness    related to neck issues  . Emphysema   . Enlarged prostate    takes Flomax daily  . GERD (gastroesophageal reflux disease)    takes PRotonix daily  . History of colon polyps   . Hyperlipidemia    takes Zocor daily  . Hypertension    takes HCTZ,Diovan daily and Isosorbide daily  . Itching    down left arm and leg  . Joint pain   . Macular degeneration    mild,beginning  . Multiple myeloma ([facility] 08/13/2014  . Myocardial infarction 2010  . Neck pain   . Pain in limb 06/20/2012  . Pneumonia  hx of---12+yrs ago    Past Surgical History:  Procedure Laterality Date  . ANTERIOR CERVICAL DECOMP/DISCECTOMY FUSION  01/31/2012   Procedure: ANTERIOR CERVICAL DECOMPRESSION/DISCECTOMY FUSION 1 LEVEL;  Surgeon: Floyce Stakes, MD;  Location: [facility];  Service: Neurosurgery;  Laterality: N/A;  Cervical Five-Six  Anterior Cervical Decompression /Diskectomy /Fusion/Plate  . BONE MARROW TRANSPLANT    . CARDIAC CATHETERIZATION  2010/2013   06/07/11 [facility]) LAD 25%, D2 75% (small), pLCx 100%, RCA 25%--medical managment   . COLONOSCOPY    . HERNIA REPAIR  2006   x 3   . salivary gland removed     left   . skin cancer removed from left side of face     basal cell carcinoma  . vein removed from  left leg      Allergies  Allergen Reactions  . Codeine Itching  . Hydrocodone-Acetaminophen Itching  . Mushroom Extract Complex Nausea And Vomiting    Only shitake mushroom  causes this reaction.  . Symbicort [Budesonide-Formoterol Fumarate] Itching     Social History   Social History  . Marital status: Married    Spouse name: N/A  . Number of children: 3  . Years of education: N/A   Occupational History  . Not on file.   Social History Main Topics  . Smoking status: Former Smoker    Years: 30.00    Types: Pipe    Quit date: 11/24/2011  . Smokeless tobacco: Former Systems developer    Quit date: 02/15/2011     Comment: quit tobacco 3 years ago  . Alcohol use 0.0 oz/week     Comment: 1-2 glasses of wine daily  . Drug use: No  . Sexual activity: Yes   Other Topics Concern  . Not on file   Social History Narrative  . No narrative on file     Family History  Problem Relation Age of Onset  . Hypertension Mother   . Heart attack Father   . Cancer Sister   . Cancer Sister     Review of Systems  Positive for shortness of breath on exertion, chest heaviness, acid heartburn, fatigue and dizziness, some anxiety and nasal congestion  Constitutional: negative for anorexia, fevers and sweats  Eyes: negative for irritation, redness and visual disturbance  Ears, nose, mouth, throat, and face: negative for earaches, epistaxis, and sore throat  Respiratory: negative for cough,  sputum and wheezing  Cardiovascular: negative for lower extremity edema, orthopnea, palpitations and syncope  Gastrointestinal: negative for abdominal pain, constipation, diarrhea, melena, nausea and vomiting  Genitourinary:negative for dysuria, frequency and hematuria  Hematologic/lymphatic: negative for bleeding, easy bruising and lymphadenopathy  Musculoskeletal:negative for arthralgias, muscle weakness and stiff joints  Neurological: negative for coordination problems, gait problems, headaches and weakness    Endocrine: negative for diabetic symptoms including polydipsia, polyuria and weight loss     Objective:   Physical Exam  Gen. Pleasant, well-nourished, in no distress, normal affect ENT - no lesions, no post nasal drip Neck: No JVD, no thyromegaly, no carotid bruits Lungs: no use of accessory muscles, no dullness to percussion, clear without rales or rhonchi  Cardiovascular: Rhythm regular, heart sounds  normal, no murmurs or gallops, no peripheral edema Abdomen: soft and non-tender, no hepatosplenomegaly, BS normal. Musculoskeletal: No deformities, no cyanosis or clubbing Neuro:  alert, non focal       Assessment & Plan:

## 2016-04-19 NOTE — Patient Instructions (Signed)
CT chest no contrast in August 2018 Please call us with the name of. DME- and we can obtain CPAP report from them  Schedule echocardiogram to check pulmonary artery pressure  Trial of Albuterol MDI 2 puffs every 6 hours as needed only for shortness of breath or wheezing

## 2016-04-19 NOTE — Telephone Encounter (Signed)
Received DL and placed in [facility]'s lookat  Please advise on results, thanks

## 2016-04-19 NOTE — Telephone Encounter (Signed)
Per AVS from today's visit, the pt was to call with DME so we could obtain DL  I called [facility] in [place]  And requested DL  Will await the results and then send to [facility]

## 2016-04-19 NOTE — Addendum Note (Signed)
Addended by: Valerie Salts on: 04/19/2016 02:34 PM   Modules accepted: Orders

## 2016-04-19 NOTE — Assessment & Plan Note (Signed)
LLL stable since 2016 RUL -new 28mm on 03/2016  -Will need follow-up 6 months that would be August 2018

## 2016-04-19 NOTE — Assessment & Plan Note (Signed)
The cause for his dyspnea is unclear at this time. He does seem to moderate airway obstruction on PFTs. FEV1 is relatively preserved at 75%-this may have worsened over the past year. I do need some left lower lobe bronchiectasis and he seems to be recovering from a recent episode of pneumonia. The elevated pulmonary pressures noted on perfusion imaging scan appears to be a new finding and he needs to be investigated for pulmonary hypertension

## 2016-04-19 NOTE — Assessment & Plan Note (Signed)
Trial of albuterol 2 puffs every 6 hours as needed If persistent may consider LABA/LAMA combination  Would also want to rule out pulmonary hypertension as a cause of his dyspnea- schedule echocardiogram

## 2016-04-20 ENCOUNTER — Ambulatory Visit (HOSPITAL_BASED_OUTPATIENT_CLINIC_OR_DEPARTMENT_OTHER)
Admission: RE | Admit: 2016-04-20 | Discharge: 2016-04-20 | Disposition: A | Discharge status: Home / Self Care | Payer: Medicare Other | Source: Ambulatory Visit | Attending: Pulmonary Disease | Admitting: Pulmonary Disease

## 2016-04-20 DIAGNOSIS — I340 Nonrheumatic mitral (valve) insufficiency: Secondary | ICD-10-CM | POA: Diagnosis not present

## 2016-04-20 DIAGNOSIS — R0600 Dyspnea, unspecified: Secondary | ICD-10-CM

## 2016-04-20 DIAGNOSIS — I361 Nonrheumatic tricuspid (valve) insufficiency: Secondary | ICD-10-CM | POA: Diagnosis not present

## 2016-04-20 DIAGNOSIS — I371 Nonrheumatic pulmonary valve insufficiency: Secondary | ICD-10-CM | POA: Diagnosis not present

## 2016-04-20 NOTE — Progress Notes (Signed)
  Echocardiogram 2D Echocardiogram has been performed.  [name] [name] 04/20/2016, 2:59 PM

## 2016-04-22 ENCOUNTER — Other Ambulatory Visit: Payer: Self-pay | Admitting: Hematology & Oncology

## 2016-04-25 ENCOUNTER — Telehealth: Payer: Self-pay | Admitting: Pulmonary Disease

## 2016-04-25 NOTE — Telephone Encounter (Signed)
Per Dr. Elsworth Soho, CPAP download shows machine is at 10cm and shows good usage. Does not recommend an increase pressure.

## 2016-04-27 ENCOUNTER — Encounter: Payer: Self-pay | Admitting: Hematology & Oncology

## 2016-05-01 ENCOUNTER — Encounter: Payer: Self-pay | Admitting: Hematology & Oncology

## 2016-05-20 ENCOUNTER — Other Ambulatory Visit (HOSPITAL_BASED_OUTPATIENT_CLINIC_OR_DEPARTMENT_OTHER): Payer: Medicare Other

## 2016-05-20 ENCOUNTER — Ambulatory Visit (HOSPITAL_BASED_OUTPATIENT_CLINIC_OR_DEPARTMENT_OTHER): Payer: Medicare Other

## 2016-05-20 ENCOUNTER — Ambulatory Visit (HOSPITAL_BASED_OUTPATIENT_CLINIC_OR_DEPARTMENT_OTHER): Payer: Medicare Other | Admitting: Hematology & Oncology

## 2016-05-20 VITALS — BP 130/78 | HR 56 | Temp 97.9°F | Resp 18 | Wt 205.1 lb

## 2016-05-20 DIAGNOSIS — C9000 Multiple myeloma not having achieved remission: Secondary | ICD-10-CM

## 2016-05-20 DIAGNOSIS — C9001 Multiple myeloma in remission: Secondary | ICD-10-CM | POA: Diagnosis not present

## 2016-05-20 DIAGNOSIS — G629 Polyneuropathy, unspecified: Secondary | ICD-10-CM | POA: Diagnosis not present

## 2016-05-20 DIAGNOSIS — I82401 Acute embolism and thrombosis of unspecified deep veins of right lower extremity: Secondary | ICD-10-CM

## 2016-05-20 DIAGNOSIS — M899 Disorder of bone, unspecified: Secondary | ICD-10-CM

## 2016-05-20 DIAGNOSIS — Z7901 Long term (current) use of anticoagulants: Secondary | ICD-10-CM

## 2016-05-20 DIAGNOSIS — J40 Bronchitis, not specified as acute or chronic: Secondary | ICD-10-CM

## 2016-05-20 LAB — CMP (CANCER CENTER ONLY)
ALBUMIN: 3.5 g/dL (ref 3.3–5.5)
ALT(SGPT): 28 U/L (ref 10–47)
AST: 30 U/L (ref 11–38)
Alkaline Phosphatase: 105 U/L — ABNORMAL HIGH (ref 26–84)
BILIRUBIN TOTAL: 1 mg/dL (ref 0.20–1.60)
BUN, Bld: 8 mg/dL (ref 7–22)
CALCIUM: 8.8 mg/dL (ref 8.0–10.3)
CHLORIDE: 101 meq/L (ref 98–108)
CO2: 29 mEq/L (ref 18–33)
CREATININE: 1.4 mg/dL — AB (ref 0.6–1.2)
Glucose, Bld: 95 mg/dL (ref 73–118)
Potassium: 3.1 mEq/L — ABNORMAL LOW (ref 3.3–4.7)
SODIUM: 141 meq/L (ref 128–145)
Total Protein: 7.1 g/dL (ref 6.4–8.1)

## 2016-05-20 LAB — CBC WITH DIFFERENTIAL (CANCER CENTER ONLY)
BASO#: 0.1 10*3/uL (ref 0.0–0.2)
BASO%: 3.3 % — ABNORMAL HIGH (ref 0.0–2.0)
EOS ABS: 0.2 10*3/uL (ref 0.0–0.5)
EOS%: 4.6 % (ref 0.0–7.0)
HEMATOCRIT: 36.9 % — AB (ref 38.7–49.9)
HEMOGLOBIN: 12.1 g/dL — AB (ref 13.0–17.1)
LYMPH#: 1.4 10*3/uL (ref 0.9–3.3)
LYMPH%: 42.6 % (ref 14.0–48.0)
MCH: 27.7 pg — AB (ref 28.0–33.4)
MCHC: 32.8 g/dL (ref 32.0–35.9)
MCV: 84 fL (ref 82–98)
MONO#: 0.5 10*3/uL (ref 0.1–0.9)
MONO%: 14.3 % — ABNORMAL HIGH (ref 0.0–13.0)
NEUT%: 35.2 % — ABNORMAL LOW (ref 40.0–80.0)
NEUTROS ABS: 1.2 10*3/uL — AB (ref 1.5–6.5)
Platelets: 206 10*3/uL (ref 145–400)
RBC: 4.37 10*6/uL (ref 4.20–5.70)
RDW: 15.7 % (ref 11.1–15.7)
WBC: 3.3 10*3/uL — AB (ref 4.0–10.0)

## 2016-05-20 MED ORDER — METHYLPREDNISOLONE 4 MG PO TBPK: ORAL_TABLET | ORAL | 0 refills | Status: DC

## 2016-05-20 MED ORDER — AZITHROMYCIN 250 MG PO TABS: ORAL_TABLET | ORAL | 0 refills | Status: DC

## 2016-05-20 MED ORDER — ZOLEDRONIC ACID 4 MG/100ML IV SOLN
4.0000 mg | Freq: Once | INTRAVENOUS | Status: AC
  Administered 2016-05-20: 4 mg via INTRAVENOUS
  Filled 2016-05-20: qty 100

## 2016-05-20 NOTE — Progress Notes (Signed)
Hematology and Oncology Follow Up Visit  [name] [name] [id number] [birth year] 76 y.o. 05/20/2016   Principle Diagnosis:  IgG kappa myeloma - Trisomy 11 by FISH Acute thromboembolism of the right lower leg  Current Therapy:   Patient s/p cycle 5 of Velcade/Revlimid/Decadron S/P ASCT at [facility] on 04/02/2015 Revlimid 10mg  po q day (21/28) Zometa 4 mg IV every 6 weeks ago Xarelto 10 mg by mouth daily    Interim History: [name] [name] is here today for follow-up. He was last here in January. He has some issues in the wintertime.  He had pneumonia. He got over this with oral antibiotics. However, he is having some discomfort in the right lower chest wall. He has pneumonia on the right side. I wonder if he has some pleurisy.  As far as myeloma is concerned, he is doing well with this. His last myeloma studies do not show a monoclonal spike. His IgG level is 751 mg/dL. His Kappa Lightchain is 2.6 mg/dL.  Because of this chest discomfort, he's not been able to go to the gym to work out.  He was seen by his doctor at [facility] a week ago. From what [name] [name] says, there was nothing that was found on exam.  He has had no bleeding. He has had no change in bowel or bladder habits. He's had no leg swelling. If he is on Xarelto for the history of thromboembolic disease in the right leg. I want to keep him on Xarelto as he is on Revlimid.  Overall, his performance status is ECOG 1.    Medications:  Allergies as of 05/20/2016      Reactions   Codeine Itching   Hydrocodone-acetaminophen Itching   Mushroom Extract Complex Nausea And Vomiting   Only shitake mushroom  causes this reaction.   Symbicort [budesonide-formoterol Fumarate] Itching      Medication List       Accurate as of 05/20/16  1:22 PM. Always use your most recent med list.          albuterol 108 (90 Base) MCG/ACT inhaler Commonly known as:  PROVENTIL HFA;VENTOLIN HFA Inhale 2 puffs into the lungs every 6 (six) hours as needed  for wheezing or shortness of breath.   amLODipine 5 MG tablet Commonly known as:  NORVASC Take 5 mg by mouth daily.   aspirin EC 81 MG tablet Take 81 mg by mouth daily.   azithromycin 250 MG tablet Commonly known as:  ZITHROMAX Take as directed.   carvedilol 6.25 MG tablet Commonly known as:  COREG Take 6.25 mg by mouth 2 (two) times daily with a meal.   chlorpheniramine-HYDROcodone 10-8 MG/5ML Suer Commonly known as:  TUSSIONEX Take 5 mLs by mouth every 12 (twelve) hours as needed for cough (cough, will cause drowsiness.).   esomeprazole 20 MG capsule Commonly known as:  NEXIUM Take 20 mg by mouth daily at 12 noon.   famciclovir 500 MG tablet Commonly known as:  FAMVIR Take 500 mg by mouth daily.   gabapentin 600 MG tablet Commonly known as:  NEURONTIN Take 600 mg by mouth daily each morning. Take 1.200 mg by mouth daily at bedtime.   hydrochlorothiazide 12.5 MG capsule Commonly known as:  MICROZIDE Take 12.5 mg by mouth daily.   isosorbide mononitrate 30 MG 24 hr tablet Commonly known as:  IMDUR Take 30 mg by mouth 2 (two) times daily.   lenalidomide 10 MG capsule Commonly known as:  REVLIMID TAKE 1 CAPSULE BY MOUTH EVERY DAY ON DAYS  1-21 THEN OFF FOR 7 DAYS. REPEAT EVERY 28 DAYS NTIR#[id number]   Loratadine 10 MG Caps Take 10 mg by mouth daily as needed (allergies).   LORazepam 1 MG tablet Commonly known as:  ATIVAN Take 0.5 mg by mouth at bedtime.   methylPREDNISolone 4 MG Tbpk tablet Commonly known as:  MEDROL DOSEPAK Take as directed.   montelukast 10 MG tablet Commonly known as:  SINGULAIR Take 10 mg by mouth at bedtime.   nitroGLYCERIN 0.4 MG SL tablet Commonly known as:  NITROSTAT Place 0.4 mg under the tongue every 5 (five) minutes as needed. For chest pain   oxyCODONE-acetaminophen 10-325 MG tablet Commonly known as:  PERCOCET Take 1 tablet by mouth daily as needed for pain.   potassium chloride SA 20 MEQ tablet Commonly known as:   K-DUR,KLOR-CON Take 20 mEq by mouth 2 (two) times daily.   pyridoxine 100 MG tablet Commonly known as:  B-6 Take 100 mg by mouth daily.   RA VITAMIN B-12 TR 1000 MCG Tbcr Generic drug:  Cyanocobalamin Take 1 tablet by mouth daily.   rivaroxaban 20 MG Tabs tablet Commonly known as:  XARELTO Take 1 tablet (20 mg total) by mouth daily with supper.       Allergies:  Allergies  Allergen Reactions  . Codeine Itching  . Hydrocodone-Acetaminophen Itching  . Mushroom Extract Complex Nausea And Vomiting    Only shitake mushroom  causes this reaction.  . Symbicort [Budesonide-Formoterol Fumarate] Itching    Past Medical History, Surgical history, Social history, and Family History were reviewed and updated.  Review of Systems: All other 10 point review of systems is negative.   Physical Exam:  weight is 205 lb 1.9 oz (93 kg). His oral temperature is 97.9 F (36.6 C). His blood pressure is 130/78 and his pulse is 56 (abnormal). His respiration is 18 and oxygen saturation is 98%.   Wt Readings from Last 3 Encounters:  05/20/16 205 lb 1.9 oz (93 kg)  04/19/16 208 lb (94.3 kg)  04/13/16 210 lb (95.3 kg)    Head and neck exam shows no ocular or oral lesions. He now has full hair regrowth. There are no palpable cervical or supraclavicular lymph nodes. Lungs are clear. Cardiac exam regular rate and rhythm with no murmurs, rubs or bruits. Abdomen is soft. He has good bowel sounds. There is no fluid wave. There is no palpable liver or spleen tip. Back exam shows no tenderness over the spine, ribs or hips. He is wearing a back brace. Extremities shows no clubbing, cyanosis or edema. There might be some slight swelling of the right lower leg. No obvious venous cord is noted. He has a negative Homans sign with the right leg. He has good pulses in his distal extremities. Neurological exam is nonfocal. Skin exam shows no rashes, ecchymoses or petechia.d  Lab Results  Component Value Date   WBC  3.3 (L) 05/20/2016   HGB 12.1 (L) 05/20/2016   HCT 36.9 (L) 05/20/2016   MCV 84 05/20/2016   PLT 206 05/20/2016   Lab Results  Component Value Date   FERRITIN 53 02/19/2013   IRON 45 02/19/2013   TIBC 320 02/19/2013   UIBC 275 02/19/2013   IRONPCTSAT 14 (L) 02/19/2013   Lab Results  Component Value Date   RBC 4.37 05/20/2016   Lab Results  Component Value Date   KPAFRELGTCHN 1.36 02/11/2015   LAMBDASER 1.09 02/11/2015   KAPLAMBRATIO 1.19 03/08/2016   Lab Results  Component Value Date  IGGSERUM 751 03/08/2016   IGA 64 (L) 02/11/2015   IGMSERUM 22 03/08/2016   Lab Results  Component Value Date   TOTALPROTELP 6.0 (L) 02/11/2015   ALBUMINELP 3.2 (L) 02/11/2015   A1GS 0.4 (H) 02/11/2015   A2GS 0.9 02/11/2015   BETS 0.5 02/11/2015   BETA2SER 0.3 02/11/2015   GAMS 0.6 (L) 02/11/2015   MSPIKE Not Observed 03/08/2016   SPEI * 02/11/2015     Chemistry      Component Value Date/Time   NA 141 05/20/2016 1154   NA 141 06/05/2015 1036   K 3.1 (L) 05/20/2016 1154   K 2.9 (LL) 06/05/2015 1036   CL 101 05/20/2016 1154   CO2 29 05/20/2016 1154   CO2 27 06/05/2015 1036   BUN 8 05/20/2016 1154   BUN 13.1 06/05/2015 1036   CREATININE 1.4 (H) 05/20/2016 1154   CREATININE 1.0 06/05/2015 1036   GLU 152 03/30/2016      Component Value Date/Time   CALCIUM 8.8 05/20/2016 1154   CALCIUM 9.4 06/05/2015 1036   ALKPHOS 105 (H) 05/20/2016 1154   ALKPHOS 94 06/05/2015 1036   AST 30 05/20/2016 1154   AST 22 06/05/2015 1036   ALT 28 05/20/2016 1154   ALT 18 06/05/2015 1036   BILITOT 1.00 05/20/2016 1154   BILITOT 0.59 06/05/2015 1036     Impression and Plan: [name] [name] is a 89 and yo male with history of IgG kappa myeloma. He has completed 5 cycles of chemotherapy and is doing well now on Revlimid. He continues to be followed by [facility] . I do not see any evidence that his myeloma is recurrent.  He might have pleurisy. Given that he had pneumonia in the right lower lung, I think  that this pain that he is having likely represents some pleurisy. As such, I will try him on a Medrol Dosepak and also give him some Zithromax.  He sees his pulmonologist next week so if he is not better by then, I'm sure his pulmonologist may have other suggestions. If he is on Xarelto so I don't think this would represent any type of pulmonary embolism.   I think we can probably move his Zometa to every 6 weeks.  I will plan to see him back in 6 weeks.  I will pray for his wife as her mother, dad and [place], is having difficulties with aggressive lung cancer.   [name], MD 4/6/20181:22 PM

## 2016-05-20 NOTE — Patient Instructions (Signed)

## 2016-05-21 LAB — IGG, IGA, IGM
IGA/IMMUNOGLOBULIN A, SERUM: 102 mg/dL (ref 61–437)
IgM, Qn, Serum: 23 mg/dL (ref 15–143)

## 2016-05-23 LAB — KAPPA/LAMBDA LIGHT CHAINS
Ig Kappa Free Light Chain: 24.8 mg/L — ABNORMAL HIGH (ref 3.3–19.4)
Ig Lambda Free Light Chain: 19 mg/L (ref 5.7–26.3)
Kappa/Lambda FluidC Ratio: 1.31 (ref 0.26–1.65)

## 2016-05-24 LAB — PROTEIN ELECTROPHORESIS, SERUM, WITH REFLEX
A/G RATIO SPE: 1.1 (ref 0.7–1.7)
Albumin: 3.4 g/dL (ref 2.9–4.4)
Alpha 1: 0.2 g/dL (ref 0.0–0.4)
Alpha 2: 0.8 g/dL (ref 0.4–1.0)
BETA: 1 g/dL (ref 0.7–1.3)
GLOBULIN, TOTAL: 3.1 g/dL (ref 2.2–3.9)
Gamma Globulin: 1.1 g/dL (ref 0.4–1.8)
TOTAL PROTEIN: 6.5 g/dL (ref 6.0–8.5)

## 2016-05-27 ENCOUNTER — Encounter: Payer: Self-pay | Admitting: Pulmonary Disease

## 2016-05-27 ENCOUNTER — Encounter: Payer: Self-pay | Admitting: Sports Medicine

## 2016-05-27 ENCOUNTER — Other Ambulatory Visit: Payer: Self-pay | Admitting: Hematology & Oncology

## 2016-05-27 ENCOUNTER — Ambulatory Visit (INDEPENDENT_AMBULATORY_CARE_PROVIDER_SITE_OTHER): Payer: Medicare Other | Admitting: Pulmonary Disease

## 2016-05-27 DIAGNOSIS — I2510 Atherosclerotic heart disease of native coronary artery without angina pectoris: Secondary | ICD-10-CM | POA: Diagnosis not present

## 2016-05-27 DIAGNOSIS — R0600 Dyspnea, unspecified: Secondary | ICD-10-CM | POA: Diagnosis not present

## 2016-05-27 DIAGNOSIS — J432 Centrilobular emphysema: Secondary | ICD-10-CM | POA: Diagnosis not present

## 2016-05-27 DIAGNOSIS — G4733 Obstructive sleep apnea (adult) (pediatric): Secondary | ICD-10-CM | POA: Diagnosis not present

## 2016-05-27 DIAGNOSIS — I2583 Coronary atherosclerosis due to lipid rich plaque: Secondary | ICD-10-CM | POA: Diagnosis not present

## 2016-05-27 DIAGNOSIS — R911 Solitary pulmonary nodule: Secondary | ICD-10-CM

## 2016-05-27 NOTE — Assessment & Plan Note (Signed)
Continue current CPAP settings. Pressure does not seem to need increase  Weight loss encouraged, compliance with goal of at least 4-6 hrs every night is the expectation. Advised against medications with sedative side effects Cautioned against driving when sleepy - understanding that sleepiness will vary on a day to day basis

## 2016-05-27 NOTE — Patient Instructions (Signed)
Stay on albuterol 2 puffs every 6 hours as needed for wheezing Chest pain may be musculoskeletal-okay to take Tylenol 500 mg every 8 hours as needed  Call if pain persists beyond 1 more month

## 2016-05-27 NOTE — Assessment & Plan Note (Signed)
Follow-up CT chest no contrast in August 2018

## 2016-05-27 NOTE — Assessment & Plan Note (Signed)
Dyspnea is resolved and right-sided chest pain appears to be musculoskeletal. Narcotics cause constipation. Can use Tylenol as needed can also use NSAIDs if okay with his oncologist If persistent will obtain x-ray rib series but prior CT scan did not show any lytic lesions in the right-sided ribs

## 2016-05-27 NOTE — Progress Notes (Signed)
Subjective:    Patient ID: [name] [name], male    DOB: 06-23-1940, 76 y.o.   MRN: [id number]  HPI  76 year old ex-smoker  For FU of mild COPD& pulmonary nodules  He has been seeing Dr. Michela Pitcher in [place] for PE, OSA and dyspnea that has been attributed to asthma and COPD for many years and would like to transfer care.  He has history of multiple myeloma and underwent bone marrow transplant at [facility] in 03/2015. He has a history of pulmonary embolism in the past and then had a DVT in right lower extremity in 2016 and is now maintained on anticoagulation lifelong with Xarelto,  decreased to 10 mg daily. OSA was diagnosed in 2016 and is being maintained on CPAP of 10 cm with good improvement in his daytime somnolence and fatigue.  He developed right-sided chest pain but 3 weeks ago, his oncologist thought that he was having pleurisy and he was given Z-Pak and prednisone dosepak. Pain is now decreased, appears to be nonexertional unrelated to breathing  He has settled down with his CPAP machine and his dyspnea has improved using albuterol as needed  Significant tests/ events  CT angiogram 03/2016 new right upper lobe 9 mm nodule. Left lower lobe 74m nodule first noted in 08/2014 has decreased to 7 mm.  PFTs 02/2015 ratio 57, FEV1 75%, 2.68, FVC 98%, DLCO 67% 10/2015 moderate airway obstruction    CT chest  03/2016 which showed new right upper lobe 9 mm nodule left lower lobe nodule has been stable since 2016  Myocardial perfusion imaging showed normal LV function and low risk for ischemia. It showed increased RV uptake suggestive of elevated pulmonary pressures.  Echo 02/2015 normal LV function, normal RVSP Echo clarified nml pressure   He quit smoking in 2013, a pack would last him a month and he probably smoked less than 10 pack years, often used a pipe. 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 Acute deep vein thrombosis (DVT) of tibial vein of right lower extremity ([facility] 09/29/2014  . Arthritis    back  . Asthma    uses Advair bid and asmanex prn  . Cancer ([place]    multiple myeloma  . Chronic back pain    buldging disc and scoliosis  . Chronic bronchitis ([facility] [place]    couple of yrs ago  . Complication of anesthesia    severe case of hiccups after hernia surgery;required medication  . Diverticulosis   . Dizziness    related to neck issues  . Emphysema   . Enlarged prostate    takes Flomax daily  . GERD (gastroesophageal reflux disease)    takes PRotonix daily  . History of colon polyps   . Hyperlipidemia    takes Zocor daily  . Hypertension    takes HCTZ,Diovan daily and Isosorbide daily  . Itching    down left arm and leg  . Joint pain   . Macular degeneration    mild,beginning  . Multiple myeloma ([facility] 08/13/2014  . Myocardial infarction 2010  . Neck pain   . Pain in limb 06/20/2012  . Pneumonia    hx of---12+yrs ago     Review of Systems  neg for any significant sore throat, dysphagia, itching, sneezing, nasal congestion or excess/ purulent secretions, fever, chills, sweats, unintended wt loss, pleuritic or exertional cp, hempoptysis,  orthopnea pnd or change in chronic leg swelling. Also denies presyncope, palpitations, heartburn, abdominal pain, nausea, vomiting, diarrhea or change in bowel or urinary habits, dysuria,hematuria, rash, arthralgias, visual complaints, headache, numbness weakness or ataxia.     Objective:   Physical Exam  Gen. Pleasant, well-nourished, in no distress, normal affect ENT - no lesions, no post nasal drip Neck: No JVD, no thyromegaly, no carotid bruits Lungs: no use of accessory muscles, no dullness to percussion, clear without rales or rhonchi ,Point tenderness over right ninth and 10th ribs in the midaxillary line Cardiovascular: Rhythm regular, heart sounds  normal, no murmurs or gallops, no  peripheral edema Abdomen: soft and non-tender, no hepatosplenomegaly, BS normal. Musculoskeletal: No deformities, no cyanosis or clubbing Neuro:  alert, non focal       Assessment & Plan:

## 2016-05-27 NOTE — Assessment & Plan Note (Signed)
Continue albuterol MDI as needed. Does not need step up to LABA/ LAMA

## 2016-05-30 ENCOUNTER — Other Ambulatory Visit: Payer: Self-pay

## 2016-05-30 MED ORDER — LENALIDOMIDE 10 MG PO CAPS: 10.0000 mg | ORAL_CAPSULE | Freq: Every day | ORAL | 0 refills | Status: DC

## 2016-06-03 DIAGNOSIS — Z23 Encounter for immunization: Secondary | ICD-10-CM | POA: Diagnosis not present

## 2016-06-09 ENCOUNTER — Other Ambulatory Visit: Payer: Self-pay | Admitting: Sports Medicine

## 2016-06-09 ENCOUNTER — Other Ambulatory Visit: Payer: Self-pay | Admitting: Hematology & Oncology

## 2016-06-14 DIAGNOSIS — H04123 Dry eye syndrome of bilateral lacrimal glands: Secondary | ICD-10-CM | POA: Diagnosis not present

## 2016-06-22 NOTE — Progress Notes (Signed)
HPI: FU coronary artery disease. Previously followed at [facility]. Last cardiac catheterization 1/16 showed significant proximal RCA disease, occluded circumflex that filled from left to left collaterals and nonobstructive disease in the LAD. Patient had PCI of the right coronary artery with a drug-eluting stent. Patient has also had recurrent pulmonary emboli in the past and is on chronic xarelto. Patient traveled to [place] in November and developed bronchitis. CTA February 2018 showed no pulmonary embolus. There was a 9 mm nodule in the right upper lobe. Patient referred to pulmonary for further evaluation. Nuclear study February 2018 showed normal perfusion. Ejection fraction 50%. Echocardiogram March 2018 showed normal LV systolic function, mild mitral regurgitation and mild left atrial enlargement. Since last seen, his symptoms have improved. He has some dyspnea with more extreme activities but not routine activities. No orthopnea, PND, pedal edema, chest pain.  Current Outpatient Prescriptions  Medication Sig Dispense Refill  . albuterol (PROVENTIL HFA;VENTOLIN HFA) 108 (90 Base) MCG/ACT inhaler Inhale 2 puffs into the lungs every 6 (six) hours as needed for wheezing or shortness of breath. 1 Inhaler 3  . amLODipine (NORVASC) 5 MG tablet Take 5 mg by mouth daily.    Marland Kitchen aspirin EC 81 MG tablet Take 81 mg by mouth daily.     . carvedilol (COREG) 6.25 MG tablet Take 6.25 mg by mouth 2 (two) times daily with a meal.    . Cyanocobalamin (RA VITAMIN B-12 TR) 1000 MCG TBCR Take 1 tablet by mouth daily.     Marland Kitchen esomeprazole (NEXIUM) 20 MG capsule Take 20 mg by mouth daily at 12 noon.    . famciclovir (FAMVIR) 500 MG tablet TAKE 1 TABLET EVERY DAY 90 tablet 3  . gabapentin (NEURONTIN) 600 MG tablet TAKE 1 TABLET EVERY MORNING  AND TAKE 2 TABLETS EVERY NIGHT 270 tablet 11  . hydrochlorothiazide (MICROZIDE) 12.5 MG capsule TAKE 1 CAPSULE EVERY DAY 90 capsule 0  . isosorbide mononitrate (IMDUR) 30 MG 24  hr tablet Take 30 mg by mouth 2 (two) times daily.    . Loratadine 10 MG CAPS Take 10 mg by mouth daily as needed (allergies).     . LORazepam (ATIVAN) 1 MG tablet TAKE 1 TABLET AT BEDTIME 90 tablet 1  . montelukast (SINGULAIR) 10 MG tablet Take 10 mg by mouth at bedtime.    . nitroGLYCERIN (NITROSTAT) 0.4 MG SL tablet Place 0.4 mg under the tongue every 5 (five) minutes as needed. For chest pain    . oxyCODONE-acetaminophen (PERCOCET) 10-325 MG tablet Take 1 tablet by mouth daily as needed for pain. 30 tablet 0  . Potassium Chloride ER 20 MEQ TBCR TAKE 1 TABLET TWICE DAILY 180 tablet 0  . pyridoxine (B-6) 100 MG tablet Take 100 mg by mouth daily.     Marland Kitchen REVLIMID 10 MG capsule TAKE 1 CAPSULE BY MOUTH EVERY DAY FOR 21 DAYS ON THEN 7 DAYS OFF 21 capsule 0  . rivaroxaban (XARELTO) 20 MG TABS tablet Take 10 mg by mouth.      No current facility-administered medications for this visit.      Past Medical History:  Diagnosis Date  . Acute deep vein thrombosis (DVT) of tibial vein of right lower extremity ([place]) 09/29/2014  . Arthritis    back  . Asthma    uses Advair bid and asmanex prn  . Cancer ([place])    multiple myeloma  . Chronic back pain    buldging disc and scoliosis  . Chronic bronchitis (  [place])    couple of yrs ago  . Complication of anesthesia    severe case of hiccups after hernia surgery;required medication  . Diverticulosis   . Dizziness    related to neck issues  . Emphysema   . Enlarged prostate    takes Flomax daily  . GERD (gastroesophageal reflux disease)    takes PRotonix daily  . History of colon polyps   . Hyperlipidemia    takes Zocor daily  . Hypertension    takes HCTZ,Diovan daily and Isosorbide daily  . Itching    down left arm and leg  . Joint pain   . Macular degeneration    mild,beginning  . Multiple myeloma ([place]) 08/13/2014  . Myocardial infarction ([place]) 2010  . Neck pain   . Pain in limb 06/20/2012  . Pneumonia    hx of---12+yrs ago    Past  Surgical History:  Procedure Laterality Date  . ANTERIOR CERVICAL DECOMP/DISCECTOMY FUSION  01/31/2012   Procedure: ANTERIOR CERVICAL DECOMPRESSION/DISCECTOMY FUSION 1 LEVEL;  Surgeon: Floyce Stakes, MD;  Location: [facility];  Service: Neurosurgery;  Laterality: N/A;  Cervical Five-Six  Anterior Cervical Decompression /Diskectomy /Fusion/Plate  . BONE MARROW TRANSPLANT    . CARDIAC CATHETERIZATION  2010/2013   06/07/11 [facility]) LAD 25%, D2 75% (small), pLCx 100%, RCA 25%--medical managment   . COLONOSCOPY    . HERNIA REPAIR  2006   x 3   . salivary gland removed     left   . skin cancer removed from left side of face     basal cell carcinoma  . vein removed from left leg      Social History   Social History  . Marital status: Married    Spouse name: N/A  . Number of children: 3  . Years of education: N/A   Occupational History  . Not on file.   Social History Main Topics  . Smoking status: Former Smoker    Years: 30.00    Types: Pipe    Quit date: 11/24/2011  . Smokeless tobacco: Former Systems developer    Quit date: 02/15/2011     Comment: quit tobacco 3 years ago  . Alcohol use 0.0 oz/week     Comment: 1-2 glasses of wine daily  . Drug use: No  . Sexual activity: Yes   Other Topics Concern  . Not on file   Social History Narrative  . No narrative on file    Family History  Problem Relation Age of Onset  . Hypertension Mother   . Heart attack Father   . Cancer Sister   . Cancer Sister     ROS: no fevers or chills, productive cough, hemoptysis, dysphasia, odynophagia, melena, hematochezia, dysuria, hematuria, rash, seizure activity, orthopnea, PND, pedal edema, claudication. Remaining systems are negative.  Physical Exam: Well-developed well-nourished in no acute distress.  Skin is warm and dry.  HEENT is normal.  Neck is supple.  Chest is clear to auscultation with normal expansion.  Cardiovascular exam is regular rate and rhythm.  Abdominal exam nontender or  distended. No masses palpated. Extremities show no edema. neuro grossly intact   A/P  1 Coronary artery disease-continue aspirin. Patient is not on a statin and feels as though this is likely because there was a potential interaction with medications for his myeloma. He will discuss this with his oncologist to see if he could be on one in the future.  2 recurrent pulmonary emboli-continue xarelto.  3 hypertension-blood pressure  is controlled. Continue present medications.   4 hyperlipidemia-continue diet. He will discuss statin with oncology.  5 multiple myeloma-per oncology  6 chest tightness-symptoms have resolved after his pneumonia/bronchitis improved. His nuclear study showed no ischemia. Plan medical therapy.    Kirk Ruths, MD

## 2016-06-24 ENCOUNTER — Other Ambulatory Visit: Payer: Self-pay | Admitting: Hematology & Oncology

## 2016-06-29 ENCOUNTER — Ambulatory Visit (INDEPENDENT_AMBULATORY_CARE_PROVIDER_SITE_OTHER): Payer: Medicare Other | Admitting: Cardiology

## 2016-06-29 ENCOUNTER — Encounter: Payer: Self-pay | Admitting: Cardiology

## 2016-06-29 VITALS — BP 112/66 | HR 61 | Ht 72.0 in | Wt 203.8 lb

## 2016-06-29 DIAGNOSIS — E7800 Pure hypercholesterolemia, unspecified: Secondary | ICD-10-CM | POA: Diagnosis not present

## 2016-06-29 DIAGNOSIS — I10 Essential (primary) hypertension: Secondary | ICD-10-CM

## 2016-06-29 DIAGNOSIS — I2510 Atherosclerotic heart disease of native coronary artery without angina pectoris: Secondary | ICD-10-CM

## 2016-06-29 DIAGNOSIS — I2583 Coronary atherosclerosis due to lipid rich plaque: Secondary | ICD-10-CM

## 2016-06-29 DIAGNOSIS — R072 Precordial pain: Secondary | ICD-10-CM | POA: Diagnosis not present

## 2016-06-29 NOTE — Patient Instructions (Signed)
Your physician wants you to follow-up in: 6 MONTHS WITH DR CRENSHAW You will receive a reminder letter in the mail two months in advance. If you don't receive a letter, please call our office to schedule the follow-up appointment.   If you need a refill on your cardiac medications before your next appointment, please call your pharmacy.  

## 2016-07-01 ENCOUNTER — Ambulatory Visit: Payer: Medicare Other

## 2016-07-01 ENCOUNTER — Ambulatory Visit: Payer: Medicare Other | Admitting: Family

## 2016-07-01 ENCOUNTER — Other Ambulatory Visit: Payer: Medicare Other

## 2016-07-05 ENCOUNTER — Other Ambulatory Visit: Payer: Self-pay | Admitting: *Deleted

## 2016-07-05 MED ORDER — LENALIDOMIDE 10 MG PO CAPS: ORAL_CAPSULE | ORAL | 0 refills | Status: DC

## 2016-07-06 ENCOUNTER — Ambulatory Visit (INDEPENDENT_AMBULATORY_CARE_PROVIDER_SITE_OTHER): Payer: Medicare Other

## 2016-07-06 ENCOUNTER — Ambulatory Visit (INDEPENDENT_AMBULATORY_CARE_PROVIDER_SITE_OTHER): Payer: Medicare Other | Admitting: Sports Medicine

## 2016-07-06 ENCOUNTER — Encounter: Payer: Self-pay | Admitting: Sports Medicine

## 2016-07-06 DIAGNOSIS — J449 Chronic obstructive pulmonary disease, unspecified: Secondary | ICD-10-CM

## 2016-07-06 DIAGNOSIS — J4489 Other specified chronic obstructive pulmonary disease: Secondary | ICD-10-CM

## 2016-07-06 DIAGNOSIS — I2583 Coronary atherosclerosis due to lipid rich plaque: Secondary | ICD-10-CM | POA: Diagnosis not present

## 2016-07-06 DIAGNOSIS — R05 Cough: Secondary | ICD-10-CM | POA: Diagnosis not present

## 2016-07-06 DIAGNOSIS — I2510 Atherosclerotic heart disease of native coronary artery without angina pectoris: Secondary | ICD-10-CM

## 2016-07-06 MED ORDER — HYDROCOD POLST-CPM POLST ER 10-8 MG/5ML PO SUER
5.0000 mL | Freq: Two times a day (BID) | ORAL | 0 refills | Status: DC | PRN
Start: 2016-07-06 — End: 2016-07-20

## 2016-07-06 MED ORDER — DOXYCYCLINE HYCLATE 100 MG PO TABS: 100.0000 mg | ORAL_TABLET | Freq: Two times a day (BID) | ORAL | 0 refills | Status: AC

## 2016-07-06 MED ORDER — PREDNISONE 50 MG PO TABS: 50.0000 mg | ORAL_TABLET | Freq: Every day | ORAL | 0 refills | Status: DC

## 2016-07-06 NOTE — Progress Notes (Signed)
  Subjective:    CC: Coughing  HPI: For the past several days this pleasant 76 year old male with a history of COPD has had increasing cough, wheeze, shortness of breath, sputum is productive, symptoms are moderate, persistent, no chest pain, this feels like previous COPD exacerbations.  Past medical history:  Negative.  See flowsheet/record as well for more information.  Surgical history: Negative.  See flowsheet/record as well for more information.  Family history: Negative.  See flowsheet/record as well for more information.  Social history: Negative.  See flowsheet/record as well for more information.  Allergies, and medications have been entered into the medical record, reviewed, and no changes needed.   Review of Systems: No fevers, chills, night sweats, weight loss, chest pain, or shortness of breath.   Objective:    General: Well Developed, well nourished, and in no acute distress.  Neuro: Alert and oriented x3, extra-ocular muscles intact, sensation grossly intact.  HEENT: Normocephalic, atraumatic, pupils equal round reactive to light, neck supple, no masses, no lymphadenopathy, thyroid nonpalpable.  Skin: Warm and dry, no rashes. Cardiac: Regular rate and rhythm, no murmurs rubs or gallops, no lower extremity edema.  Respiratory: Coarse sounds throughout with decreased air movement the left upper lobe. Not using accessory muscles, speaking in full sentences.  Impression and Recommendations:    Asthma with COPD Doxycycline, prednisone, chest x-ray. Tussionex for cough. Return if no better in 2 weeks.  I spent 25 minutes with this patient, greater than 50% was face-to-face time counseling regarding the above diagnoses

## 2016-07-06 NOTE — Assessment & Plan Note (Signed)
Doxycycline, prednisone, chest x-ray. Tussionex for cough. Return if no better in 2 weeks.

## 2016-07-13 ENCOUNTER — Encounter: Payer: Self-pay | Admitting: Sports Medicine

## 2016-07-13 MED ORDER — AZITHROMYCIN 250 MG PO TABS: ORAL_TABLET | ORAL | 0 refills | Status: DC

## 2016-07-20 ENCOUNTER — Ambulatory Visit (HOSPITAL_BASED_OUTPATIENT_CLINIC_OR_DEPARTMENT_OTHER): Payer: Medicare Other | Admitting: Family

## 2016-07-20 ENCOUNTER — Ambulatory Visit (HOSPITAL_BASED_OUTPATIENT_CLINIC_OR_DEPARTMENT_OTHER): Payer: Medicare Other

## 2016-07-20 ENCOUNTER — Other Ambulatory Visit (HOSPITAL_BASED_OUTPATIENT_CLINIC_OR_DEPARTMENT_OTHER): Payer: Medicare Other

## 2016-07-20 VITALS — BP 124/62 | HR 58 | Temp 97.7°F | Resp 17 | Wt 199.5 lb

## 2016-07-20 DIAGNOSIS — J40 Bronchitis, not specified as acute or chronic: Secondary | ICD-10-CM | POA: Diagnosis not present

## 2016-07-20 DIAGNOSIS — I82401 Acute embolism and thrombosis of unspecified deep veins of right lower extremity: Secondary | ICD-10-CM | POA: Diagnosis not present

## 2016-07-20 DIAGNOSIS — C9000 Multiple myeloma not having achieved remission: Secondary | ICD-10-CM

## 2016-07-20 DIAGNOSIS — M899 Disorder of bone, unspecified: Secondary | ICD-10-CM

## 2016-07-20 DIAGNOSIS — C9001 Multiple myeloma in remission: Secondary | ICD-10-CM

## 2016-07-20 DIAGNOSIS — Z86711 Personal history of pulmonary embolism: Secondary | ICD-10-CM

## 2016-07-20 DIAGNOSIS — I82441 Acute embolism and thrombosis of right tibial vein: Secondary | ICD-10-CM

## 2016-07-20 LAB — CMP (CANCER CENTER ONLY)
ALBUMIN: 3.1 g/dL — AB (ref 3.3–5.5)
ALK PHOS: 116 U/L — AB (ref 26–84)
ALT: 38 U/L (ref 10–47)
AST: 32 U/L (ref 11–38)
BILIRUBIN TOTAL: 0.8 mg/dL (ref 0.20–1.60)
BUN: 8 mg/dL (ref 7–22)
CHLORIDE: 106 meq/L (ref 98–108)
CO2: 27 mEq/L (ref 18–33)
CREATININE: 1.2 mg/dL (ref 0.6–1.2)
Calcium: 9.3 mg/dL (ref 8.0–10.3)
Glucose, Bld: 104 mg/dL (ref 73–118)
Potassium: 3.6 mEq/L (ref 3.3–4.7)
SODIUM: 138 meq/L (ref 128–145)
TOTAL PROTEIN: 6.6 g/dL (ref 6.4–8.1)

## 2016-07-20 LAB — CBC WITH DIFFERENTIAL (CANCER CENTER ONLY)
BASO#: 0.1 10*3/uL (ref 0.0–0.2)
BASO%: 2.3 % — AB (ref 0.0–2.0)
EOS ABS: 0.1 10*3/uL (ref 0.0–0.5)
EOS%: 2.6 % (ref 0.0–7.0)
HCT: 38.6 % — ABNORMAL LOW (ref 38.7–49.9)
HEMOGLOBIN: 12.8 g/dL — AB (ref 13.0–17.1)
LYMPH#: 1.4 10*3/uL (ref 0.9–3.3)
LYMPH%: 32.6 % (ref 14.0–48.0)
MCH: 27.2 pg — ABNORMAL LOW (ref 28.0–33.4)
MCHC: 33.2 g/dL (ref 32.0–35.9)
MCV: 82 fL (ref 82–98)
MONO#: 0.6 10*3/uL (ref 0.1–0.9)
MONO%: 15 % — ABNORMAL HIGH (ref 0.0–13.0)
NEUT%: 47.5 % (ref 40.0–80.0)
NEUTROS ABS: 2 10*3/uL (ref 1.5–6.5)
PLATELETS: 214 10*3/uL (ref 145–400)
RBC: 4.71 10*6/uL (ref 4.20–5.70)
RDW: 17.2 % — ABNORMAL HIGH (ref 11.1–15.7)
WBC: 4.3 10*3/uL (ref 4.0–10.0)

## 2016-07-20 LAB — LACTATE DEHYDROGENASE: LDH: 151 U/L (ref 125–245)

## 2016-07-20 MED ORDER — ZOLEDRONIC ACID 4 MG/100ML IV SOLN
4.0000 mg | Freq: Once | INTRAVENOUS | Status: AC
  Administered 2016-07-20: 4 mg via INTRAVENOUS
  Filled 2016-07-20: qty 100

## 2016-07-20 NOTE — Progress Notes (Signed)
Hematology and Oncology Follow Up Visit  [name] [name] [id number] 30-Nov-1940 76 y.o. 07/20/2016   Principle Diagnosis:  IgG kappa myeloma - Trisomy 11 by FISH Acute thromboembolism of the right lower leg  Current Therapy:   Revlimid 10mg  po q day (21/28) Zometa 4 mg IV every 6 weeks ago Xarelto 10 mg by mouth daily  Past Therapy:  Patient s/p cycle 5 of Velcade/Revlimid/Decadron S/P ASCT at [facility] on 04/02/2015   Interim History:  [name] [name] is here today with his sweet wife for follow-up and Zometa. His bronchitis has finally started to improve. He still has a dry cough and some congestion. He will try Claritin and see if this will help as well. He states that he completed treatment with doxycycline and a z-pack as well as prednisone. He is still taking benadryl as needed.  His myeloma studies have been stable. He had no M-spike in April. Kappa light chain was 24.8 mg/L and IgG level was 1,006 mg/dL.  No fever, chills, n/v, rash, dizziness, SOB, chest pain, palpitations, abdominal pain or changes in bowel or bladder habits.  He has occasional blood in his stool when he strains due to constipation due to hemorrhoids.  He uses a stool softener and Miralax as needed.  He describes his neuropathy as "controlled" on Neurontin. No swelling or tenderness in his extremities at this time.  He has maintained a good appetite and is staying well hydrated. His weight is stable.   ECOG Performance Status: 1 - Symptomatic but completely ambulatory  Medications:  Allergies as of 07/20/2016      Reactions   Codeine Itching   Hydrocodone-acetaminophen Itching   Mushroom Extract Complex Nausea And Vomiting   Only shitake mushroom  causes this reaction.   Symbicort [budesonide-formoterol Fumarate] Itching      Medication List       Accurate as of 07/20/16  5:41 PM. Always use your most recent med list.          albuterol 108 (90 Base) MCG/ACT inhaler Commonly known as:  PROVENTIL  HFA;VENTOLIN HFA Inhale 2 puffs into the lungs every 6 (six) hours as needed for wheezing or shortness of breath.   amLODipine 5 MG tablet Commonly known as:  NORVASC Take 5 mg by mouth daily.   aspirin EC 81 MG tablet Take 81 mg by mouth daily.   carvedilol 6.25 MG tablet Commonly known as:  COREG Take 6.25 mg by mouth 2 (two) times daily with a meal.   esomeprazole 20 MG capsule Commonly known as:  NEXIUM Take 20 mg by mouth daily at 12 noon.   famciclovir 500 MG tablet Commonly known as:  FAMVIR TAKE 1 TABLET EVERY DAY   gabapentin 600 MG tablet Commonly known as:  NEURONTIN TAKE 1 TABLET EVERY MORNING  AND TAKE 2 TABLETS EVERY NIGHT   hydrochlorothiazide 12.5 MG capsule Commonly known as:  MICROZIDE TAKE 1 CAPSULE EVERY DAY   isosorbide mononitrate 30 MG 24 hr tablet Commonly known as:  IMDUR Take 30 mg by mouth 2 (two) times daily.   lenalidomide 10 MG capsule Commonly known as:  REVLIMID TAKE 1 CAPSULE BY MOUTH EVERY DAY FOR 21 DAYS ON THEN 7 DAYS OFF. [id number]   Loratadine 10 MG Caps Take 10 mg by mouth daily as needed (allergies).   LORazepam 1 MG tablet Commonly known as:  ATIVAN TAKE 1 TABLET AT BEDTIME   montelukast 10 MG tablet Commonly known as:  SINGULAIR Take 10 mg by mouth at bedtime.  nitroGLYCERIN 0.4 MG SL tablet Commonly known as:  NITROSTAT Place 0.4 mg under the tongue every 5 (five) minutes as needed. For chest pain   oxyCODONE-acetaminophen 10-325 MG tablet Commonly known as:  PERCOCET Take 1 tablet by mouth daily as needed for pain.   Potassium Chloride ER 20 MEQ Tbcr TAKE 1 TABLET TWICE DAILY   pyridoxine 100 MG tablet Commonly known as:  B-6 Take 100 mg by mouth daily.   RA VITAMIN B-12 TR 1000 MCG Tbcr Generic drug:  Cyanocobalamin Take 1 tablet by mouth daily.   rivaroxaban 20 MG Tabs tablet Commonly known as:  XARELTO Take 10 mg by mouth.       Allergies:  Allergies  Allergen Reactions  . Codeine Itching    . Hydrocodone-Acetaminophen Itching  . Mushroom Extract Complex Nausea And Vomiting    Only shitake mushroom  causes this reaction.  . Symbicort [Budesonide-Formoterol Fumarate] Itching    Past Medical History, Surgical history, Social history, and Family History were reviewed and updated.  Review of Systems: All other 10 point review of systems is negative.   Physical Exam:  weight is 199 lb 8 oz (90.5 kg). His oral temperature is 97.7 F (36.5 C). His blood pressure is 124/62 and his pulse is 58 (abnormal). His respiration is 17 and oxygen saturation is 97%.   Wt Readings from Last 3 Encounters:  07/20/16 199 lb 8 oz (90.5 kg)  07/06/16 206 lb (93.4 kg)  06/29/16 203 lb 12.8 oz (92.4 kg)    Ocular: Sclerae unicteric, pupils equal, round and reactive to light Ear-nose-throat: Oropharynx clear, dentition fair Lymphatic: No cervical, supraclavicular or axillary adenopathy Lungs no rales or rhonchi, good excursion bilaterally Heart regular rate and rhythm, no murmur appreciated Abd soft, nontender, positive bowel sounds, no liver or spleen tip palpated on exam, no fluid wave MSK no focal spinal tenderness, no joint edema Neuro: non-focal, well-oriented, appropriate affect Breasts: Deferred   Lab Results  Component Value Date   WBC 4.3 07/20/2016   HGB 12.8 (L) 07/20/2016   HCT 38.6 (L) 07/20/2016   MCV 82 07/20/2016   PLT 214 07/20/2016   Lab Results  Component Value Date   FERRITIN 53 02/19/2013   IRON 45 02/19/2013   TIBC 320 02/19/2013   UIBC 275 02/19/2013   IRONPCTSAT 14 (L) 02/19/2013   Lab Results  Component Value Date   RBC 4.71 07/20/2016   Lab Results  Component Value Date   KPAFRELGTCHN 1.36 02/11/2015   LAMBDASER 1.09 02/11/2015   KAPLAMBRATIO 1.31 05/20/2016   Lab Results  Component Value Date   IGGSERUM 1,006 05/20/2016   IGA 64 (L) 02/11/2015   IGMSERUM 23 05/20/2016   Lab Results  Component Value Date   TOTALPROTELP 6.0 (L) 02/11/2015    ALBUMINELP 3.2 (L) 02/11/2015   A1GS 0.4 (H) 02/11/2015   A2GS 0.9 02/11/2015   BETS 0.5 02/11/2015   BETA2SER 0.3 02/11/2015   GAMS 0.6 (L) 02/11/2015   MSPIKE Not Observed 05/20/2016   SPEI * 02/11/2015     Chemistry      Component Value Date/Time   NA 138 07/20/2016 1131   NA 141 06/05/2015 1036   K 3.6 07/20/2016 1131   K 2.9 (LL) 06/05/2015 1036   CL 106 07/20/2016 1131   CO2 27 07/20/2016 1131   CO2 27 06/05/2015 1036   BUN 8 07/20/2016 1131   BUN 13.1 06/05/2015 1036   CREATININE 1.2 07/20/2016 1131   CREATININE 1.0 06/05/2015 1036  GLU 152 03/30/2016      Component Value Date/Time   CALCIUM 9.3 07/20/2016 1131   CALCIUM 9.4 06/05/2015 1036   ALKPHOS 116 (H) 07/20/2016 1131   ALKPHOS 94 06/05/2015 1036   AST 32 07/20/2016 1131   AST 22 06/05/2015 1036   ALT 38 07/20/2016 1131   ALT 18 06/05/2015 1036   BILITOT 0.80 07/20/2016 1131   BILITOT 0.59 06/05/2015 1036      Impression and Plan: [name] [name] is a very pleasant 76 yo caucasian gentleman with history of IgG kappa Myeloma and ASCT at [facility] in February 2017. He completed 5 cycles of chemotherapy and is doing well on Revlimid with no complaints at this time.  We will proceed with Zometa today as planned. I spoke with Dr. Marin Olp and we will move his Zometa and follow-up to every 8 weeks.  Both he and his wife know to contact our office with any questions or concerns. We can certainly see him sooner if need be.  Greater than 50% of his 15 minute visit was spent counseling and coordinating care.   Eliezer Bottom, NP 6/6/20185:41 PM

## 2016-07-20 NOTE — Patient Instructions (Signed)

## 2016-07-21 LAB — IGG, IGA, IGM
IGA/IMMUNOGLOBULIN A, SERUM: 90 mg/dL (ref 61–437)
IgG, Qn, Serum: 905 mg/dL (ref 700–1600)
IgM, Qn, Serum: 25 mg/dL (ref 15–143)

## 2016-07-21 LAB — PROTEIN ELECTROPHORESIS, SERUM, WITH REFLEX
A/G RATIO SPE: 1 (ref 0.7–1.7)
ALBUMIN: 3.1 g/dL (ref 2.9–4.4)
Alpha 1: 0.3 g/dL (ref 0.0–0.4)
Alpha 2: 0.9 g/dL (ref 0.4–1.0)
Beta: 1 g/dL (ref 0.7–1.3)
Gamma Globulin: 1 g/dL (ref 0.4–1.8)
Globulin, Total: 3.2 g/dL (ref 2.2–3.9)
Total Protein: 6.3 g/dL (ref 6.0–8.5)

## 2016-07-21 LAB — KAPPA/LAMBDA LIGHT CHAINS
IG LAMBDA FREE LIGHT CHAIN: 17.2 mg/L (ref 5.7–26.3)
Ig Kappa Free Light Chain: 18.2 mg/L (ref 3.3–19.4)
KAPPA/LAMBDA FLC RATIO: 1.06 (ref 0.26–1.65)

## 2016-07-22 ENCOUNTER — Encounter: Payer: Self-pay | Admitting: *Deleted

## 2016-07-30 ENCOUNTER — Other Ambulatory Visit: Payer: Self-pay | Admitting: Hematology & Oncology

## 2016-08-03 ENCOUNTER — Other Ambulatory Visit: Payer: Self-pay | Admitting: *Deleted

## 2016-08-03 MED ORDER — LENALIDOMIDE 10 MG PO CAPS: ORAL_CAPSULE | ORAL | 0 refills | Status: DC

## 2016-08-12 ENCOUNTER — Ambulatory Visit (INDEPENDENT_AMBULATORY_CARE_PROVIDER_SITE_OTHER): Payer: Medicare Other | Admitting: Sports Medicine

## 2016-08-12 DIAGNOSIS — E119 Type 2 diabetes mellitus without complications: Secondary | ICD-10-CM | POA: Diagnosis not present

## 2016-08-12 DIAGNOSIS — I2510 Atherosclerotic heart disease of native coronary artery without angina pectoris: Secondary | ICD-10-CM

## 2016-08-12 DIAGNOSIS — I2583 Coronary atherosclerosis due to lipid rich plaque: Secondary | ICD-10-CM | POA: Diagnosis not present

## 2016-08-12 DIAGNOSIS — B09 Unspecified viral infection characterized by skin and mucous membrane lesions: Secondary | ICD-10-CM | POA: Diagnosis not present

## 2016-08-12 NOTE — Assessment & Plan Note (Signed)
Nonspecific viral symptoms, he had a rash that was fairly severe yesterday, maculopapular, that is now almost completely resolved, he can take Benadryl as needed, and this will resolve on its own.

## 2016-08-12 NOTE — Progress Notes (Signed)
  Subjective:    CC: skin rash  HPI: For the past several days this pleasant 76 year old male has had a mild headache, mild nausea, he then developed a plaque-like rash over his entire body that was minimally itchy, over the next day the rash almost completely resolved. He feels pretty good with the exception of a minimally itchy residual rash. Symptoms are mild, improving.  Past medical history:  Negative.  See flowsheet/record as well for more information.  Surgical history: Negative.  See flowsheet/record as well for more information.  Family history: Negative.  See flowsheet/record as well for more information.  Social history: Negative.  See flowsheet/record as well for more information.  Allergies, and medications have been entered into the medical record, reviewed, and no changes needed.   Review of Systems: No fevers, chills, night sweats, weight loss, chest pain, or shortness of breath.   Objective:    General: Well Developed, well nourished, and in no acute distress.  Neuro: Alert and oriented x3, extra-ocular muscles intact, sensation grossly intact.  HEENT: Normocephalic, atraumatic, pupils equal round reactive to light, neck supple, no masses, no lymphadenopathy, thyroid nonpalpable.  Skin: Warm and dry, mild appearing maculopapular rash over the trunk, no signs of bacterial superinfection. Cardiac: Regular rate and rhythm, no murmurs rubs or gallops, no lower extremity edema.  Respiratory: Clear to auscultation bilaterally. Not using accessory muscles, speaking in full sentences.  Diabetic Foot Exam Both feet were examined, there are no signs of ulceration or abnormal callus. Nails are unremarkable. Dorsalis pedis and posterior tibial pulses are palpable. Sensation is intact to sharp and monofilament. Shoes are of appropriate fitment.  Impression and Recommendations:    Diabetes mellitus, type 2 Has been diet controlled for some time now. Up-to-date, need to add urine  microalbumin and that's it  Viral rash  Nonspecific viral symptoms, he had a rash that was fairly severe yesterday, maculopapular, that is now almost completely resolved, he can take Benadryl as needed, and this will resolve on its own.  I spent 25 minutes with this patient, greater than 50% was face-to-face time counseling regarding the above diagnoses

## 2016-08-12 NOTE — Assessment & Plan Note (Signed)
Has been diet controlled for some time now. Up-to-date, need to add urine microalbumin and that's it

## 2016-08-13 LAB — MICROALBUMIN / CREATININE URINE RATIO
Creatinine, Urine: 103 mg/dL (ref 20–370)
Microalb Creat Ratio: 22 ug/mg{creat} (ref <30)
Microalb, Ur: 2.3 mg/dL

## 2016-08-21 IMAGING — CR DG KNEE COMPLETE 4+V*L*
1 series · 1 of 1 positions shown · non-contrast
Comparison: No recent prior.

CLINICAL DATA: Prior history of fall.  Knee pain.  Knee injections.

EXAM:
LEFT KNEE - COMPLETE 4+ VIEW

[knee lat]
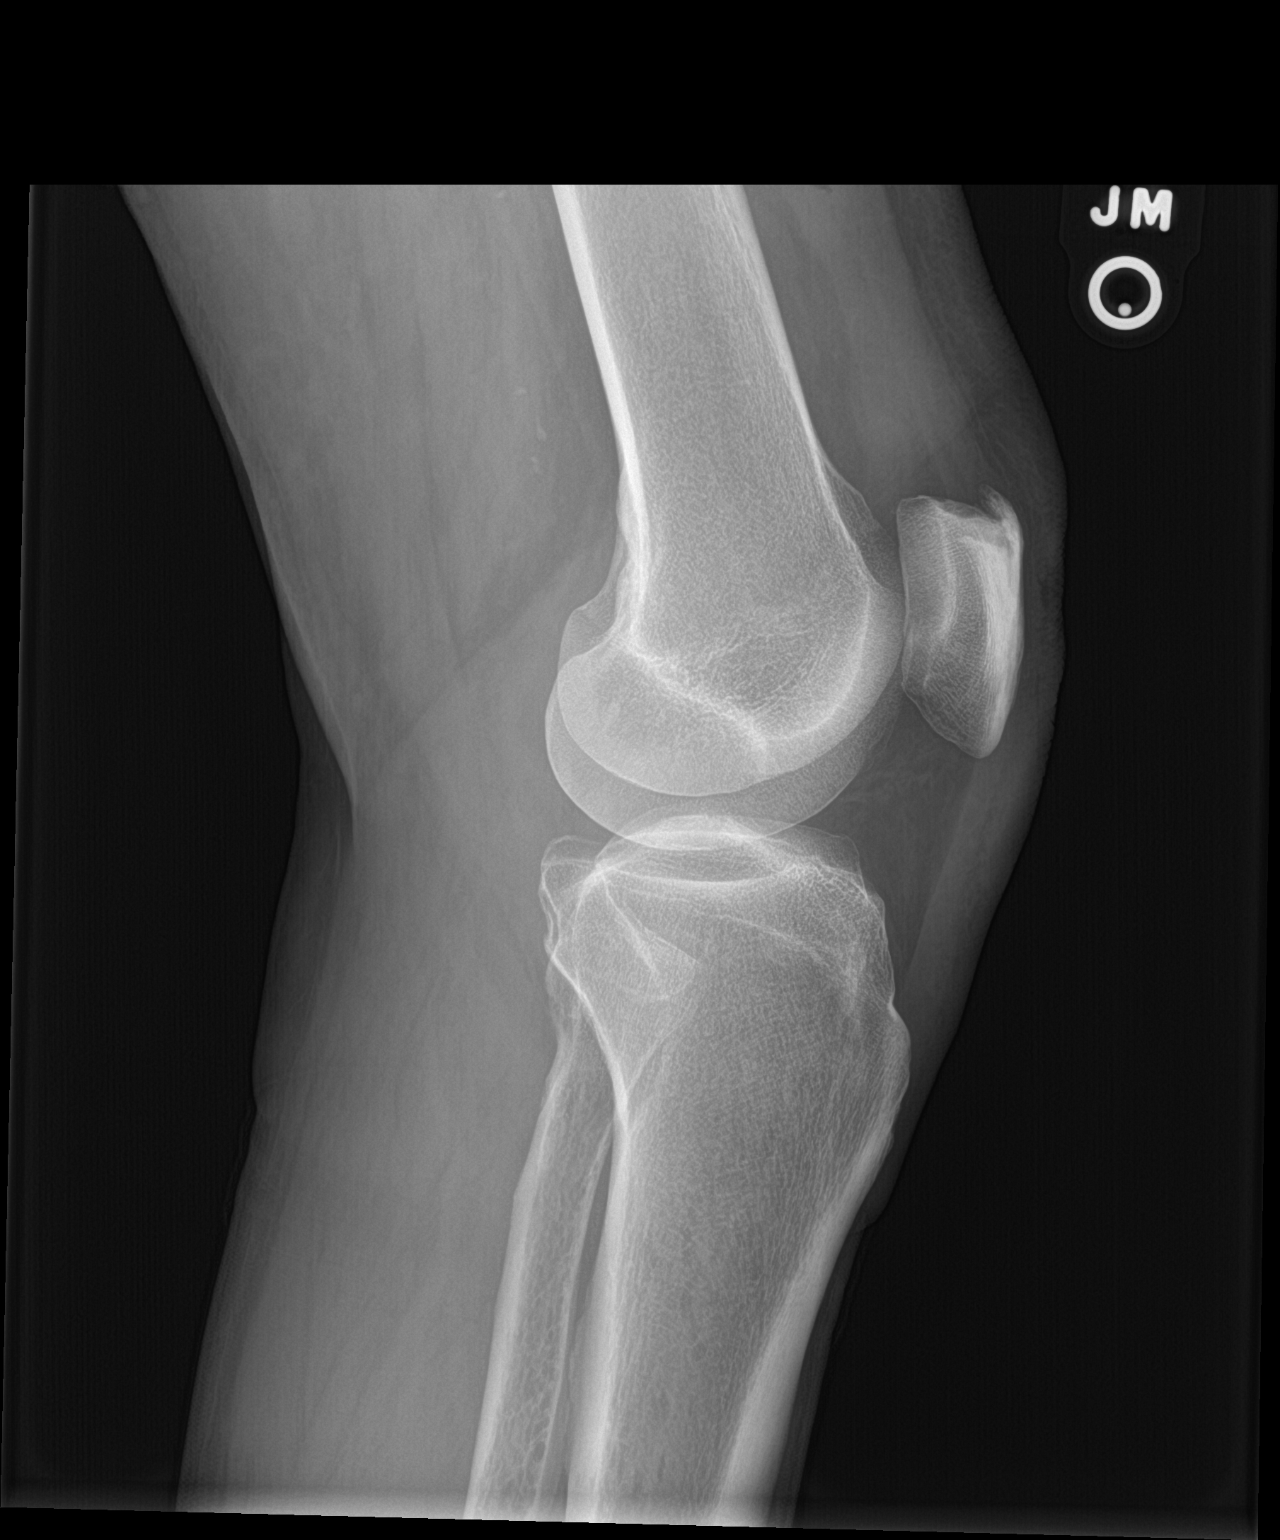

[1 of 1 positions shown; findings below may reference images not displayed]

FINDINGS: No acute bony or joint abnormality identified. No evidence of
fracture dislocation.Tiny loose bodies both knee joint spaces cannot
be excluded. Tiny bilateral knee joint effusions cannot be excluded.
Peripheral vascular calcification. Scratched
IMPRESSION: 1. No acute bony abnormality.Mild medial compartment and
patellofemoral degenerative change. Tiny loose bodies in both knee
joint spaces cannot be excluded. Tiny bilateral knee joint effusions
cannot be excluded.
2. Peripheral vascular disease.

## 2016-08-24 ENCOUNTER — Other Ambulatory Visit: Payer: Self-pay | Admitting: *Deleted

## 2016-08-24 MED ORDER — LENALIDOMIDE 10 MG PO CAPS: ORAL_CAPSULE | ORAL | 0 refills | Status: DC

## 2016-08-26 ENCOUNTER — Other Ambulatory Visit: Payer: Self-pay | Admitting: Hematology & Oncology

## 2016-09-01 ENCOUNTER — Other Ambulatory Visit: Payer: Self-pay | Admitting: Sports Medicine

## 2016-09-01 ENCOUNTER — Other Ambulatory Visit: Payer: Self-pay | Admitting: Hematology & Oncology

## 2016-09-14 ENCOUNTER — Ambulatory Visit: Payer: Medicare Other

## 2016-09-14 ENCOUNTER — Other Ambulatory Visit (HOSPITAL_BASED_OUTPATIENT_CLINIC_OR_DEPARTMENT_OTHER): Payer: Medicare Other

## 2016-09-14 ENCOUNTER — Ambulatory Visit (HOSPITAL_BASED_OUTPATIENT_CLINIC_OR_DEPARTMENT_OTHER): Payer: Medicare Other | Admitting: Hematology & Oncology

## 2016-09-14 VITALS — BP 103/60 | HR 56 | Temp 97.8°F | Resp 17 | Wt 202.0 lb

## 2016-09-14 DIAGNOSIS — I82441 Acute embolism and thrombosis of right tibial vein: Secondary | ICD-10-CM | POA: Diagnosis not present

## 2016-09-14 DIAGNOSIS — R6884 Jaw pain: Secondary | ICD-10-CM | POA: Diagnosis not present

## 2016-09-14 DIAGNOSIS — C9000 Multiple myeloma not having achieved remission: Secondary | ICD-10-CM | POA: Diagnosis not present

## 2016-09-14 DIAGNOSIS — Z9484 Stem cells transplant status: Secondary | ICD-10-CM | POA: Diagnosis not present

## 2016-09-14 DIAGNOSIS — Z86711 Personal history of pulmonary embolism: Secondary | ICD-10-CM

## 2016-09-14 LAB — CMP (CANCER CENTER ONLY)
ALT(SGPT): 35 U/L (ref 10–47)
AST: 31 U/L (ref 11–38)
Albumin: 3 g/dL — ABNORMAL LOW (ref 3.3–5.5)
Alkaline Phosphatase: 98 U/L — ABNORMAL HIGH (ref 26–84)
BUN: 11 mg/dL (ref 7–22)
CHLORIDE: 102 meq/L (ref 98–108)
CO2: 27 mEq/L (ref 18–33)
CREATININE: 1.1 mg/dL (ref 0.6–1.2)
Calcium: 8.8 mg/dL (ref 8.0–10.3)
GLUCOSE: 127 mg/dL — AB (ref 73–118)
POTASSIUM: 3.4 meq/L (ref 3.3–4.7)
SODIUM: 135 meq/L (ref 128–145)
TOTAL PROTEIN: 6.4 g/dL (ref 6.4–8.1)
Total Bilirubin: 1.2 mg/dl (ref 0.20–1.60)

## 2016-09-14 LAB — CBC WITH DIFFERENTIAL (CANCER CENTER ONLY)
BASO#: 0.1 10*3/uL (ref 0.0–0.2)
BASO%: 3.3 % — AB (ref 0.0–2.0)
EOS ABS: 0.2 10*3/uL (ref 0.0–0.5)
EOS%: 4.1 % (ref 0.0–7.0)
HCT: 35.9 % — ABNORMAL LOW (ref 38.7–49.9)
HGB: 12.1 g/dL — ABNORMAL LOW (ref 13.0–17.1)
LYMPH#: 1.3 10*3/uL (ref 0.9–3.3)
LYMPH%: 35.2 % (ref 14.0–48.0)
MCH: 27.9 pg — AB (ref 28.0–33.4)
MCHC: 33.7 g/dL (ref 32.0–35.9)
MCV: 83 fL (ref 82–98)
MONO#: 0.7 10*3/uL (ref 0.1–0.9)
MONO%: 19.1 % — AB (ref 0.0–13.0)
NEUT#: 1.4 10*3/uL — ABNORMAL LOW (ref 1.5–6.5)
NEUT%: 38.3 % — AB (ref 40.0–80.0)
PLATELETS: 187 10*3/uL (ref 145–400)
RBC: 4.33 10*6/uL (ref 4.20–5.70)
RDW: 16.3 % — AB (ref 11.1–15.7)
WBC: 3.7 10*3/uL — AB (ref 4.0–10.0)

## 2016-09-14 LAB — LACTATE DEHYDROGENASE: LDH: 132 U/L (ref 125–245)

## 2016-09-14 NOTE — Progress Notes (Signed)
Hematology and Oncology Follow Up Visit  [name] [name] [id number] 1940-03-29 76 y.o. 09/14/2016   Principle Diagnosis:  IgG kappa myeloma - Trisomy 11 by FISH Acute thromboembolism of the right lower leg  Current Therapy:   Revlimid 10mg  po q day (21/28) Zometa 4 mg IV every 6 weeks ago Xarelto 10 mg by mouth daily  Past Therapy:  Patient s/p cycle 5 of Velcade/Revlimid/Decadron S/P ASCT at [facility] on 04/02/2015   Interim History:  [name] [name] is here today with his sweet wife for follow-up.  Unfortunately, his wife's mother passed away in [place]. She had lung cancer. She was down there when she passed away. She was happy that she was with her mother when it happened. She recently got home from [place].   [name] [name] is complaining of some oral pain. He says his teeth hurt. I know this is from the [place]. I will get a Panorex on him. I wonder hold the Zometa today.   As far as his myeloma goes, he is unwell with this. His last M spike was not detected. His IgG level was 905 mg/dL. His Kappa Light chain was 1.8 mg/dL.   He has a little bit of a rash. This is on his forehead. This might be from the Revlimid. He is putting some cream on it.   He's had some diarrhea. He says this is intermittent. He does not take anything for this. It is not related to anything that he eats as far as he can tell.   He has not had any fever. He's had no cough. He's had no bleeding.   Overall, his performance status is ECOG 1.  Medications:  Allergies as of 09/14/2016      Reactions   Codeine Itching   Hydrocodone-acetaminophen Itching   Mushroom Extract Complex Nausea And Vomiting   Only shitake mushroom  causes this reaction.   Symbicort [budesonide-formoterol Fumarate] Itching      Medication List       Accurate as of 09/14/16 10:46 AM. Always use your most recent med list.          albuterol 108 (90 Base) MCG/ACT inhaler Commonly known as:  PROVENTIL HFA;VENTOLIN HFA Inhale 2 puffs into  the lungs every 6 (six) hours as needed for wheezing or shortness of breath.   amLODipine 5 MG tablet Commonly known as:  NORVASC TAKE 1 TABLET EVERY DAY   carvedilol 6.25 MG tablet Commonly known as:  COREG TAKE 1 TABLET TWICE DAILY WITH A MEAL   esomeprazole 20 MG capsule Commonly known as:  NEXIUM Take 20 mg by mouth daily at 12 noon.   famciclovir 500 MG tablet Commonly known as:  FAMVIR TAKE 1 TABLET EVERY DAY   gabapentin 600 MG tablet Commonly known as:  NEURONTIN TAKE 1 TABLET EVERY MORNING  AND TAKE 2 TABLETS EVERY NIGHT   hydrochlorothiazide 12.5 MG capsule Commonly known as:  MICROZIDE TAKE 1 CAPSULE EVERY DAY   isosorbide mononitrate 30 MG 24 hr tablet Commonly known as:  IMDUR TAKE 1 TABLET TWICE DAILY   lenalidomide 10 MG capsule Commonly known as:  REVLIMID Take 1 capsule by mouth every day for 21 days on then 7 days off. [id number]   Loratadine 10 MG Caps Take 10 mg by mouth daily as needed (allergies).   LORazepam 1 MG tablet Commonly known as:  ATIVAN TAKE 1 TABLET AT BEDTIME   montelukast 10 MG tablet Commonly known as:  SINGULAIR TAKE 1 TABLET AT BEDTIME  nitroGLYCERIN 0.4 MG SL tablet Commonly known as:  NITROSTAT Place 0.4 mg under the tongue every 5 (five) minutes as needed. For chest pain   oxyCODONE-acetaminophen 10-325 MG tablet Commonly known as:  PERCOCET Take 1 tablet by mouth daily as needed for pain.   Potassium Chloride ER 20 MEQ Tbcr TAKE 1 TABLET TWICE DAILY   Potassium Chloride ER 20 MEQ Tbcr TAKE 1 TABLET TWICE DAILY   pyridoxine 100 MG tablet Commonly known as:  B-6 Take 100 mg by mouth daily.   RA VITAMIN B-12 TR 1000 MCG Tbcr Generic drug:  Cyanocobalamin Take 1 tablet by mouth daily.   rivaroxaban 20 MG Tabs tablet Commonly known as:  XARELTO Take 10 mg by mouth.       Allergies:  Allergies  Allergen Reactions  . Codeine Itching  . Hydrocodone-Acetaminophen Itching  . Mushroom Extract Complex  Nausea And Vomiting    Only shitake mushroom  causes this reaction.  . Symbicort [Budesonide-Formoterol Fumarate] Itching    Past Medical History, Surgical history, Social history, and Family History were reviewed and updated.  Review of Systems: All other 10 point review of systems is negative.   Physical Exam:  weight is 202 lb (91.6 kg). His oral temperature is 97.8 F (36.6 C). His blood pressure is 103/60 and his pulse is 56 (abnormal). His respiration is 17 and oxygen saturation is 97%.   Wt Readings from Last 3 Encounters:  09/14/16 202 lb (91.6 kg)  08/12/16 202 lb (91.6 kg)  07/20/16 199 lb 8 oz (90.5 kg)    Head and neck exam shows no ocular or oral lesions. He now has full hair regrowth. There are no palpable cervical or supraclavicular lymph nodes. Lungs are clear. Cardiac exam regular rate and rhythm with no murmurs, rubs or bruits. Abdomen is soft. He has good bowel sounds. There is no fluid wave. There is no palpable liver or spleen tip. Back exam shows no tenderness over the spine, ribs or hips. He is wearing a back brace. Extremities shows no clubbing, cyanosis or edema. There might be some slight swelling of the right lower leg. No obvious venous cord is noted. He has a negative Homans sign with the right leg. He has good pulses in his distal extremities. Neurological exam is nonfocal. Skin exam shows no rashes, ecchymoses or petechia.   Lab Results  Component Value Date   WBC 3.7 (L) 09/14/2016   HGB 12.1 (L) 09/14/2016   HCT 35.9 (L) 09/14/2016   MCV 83 09/14/2016   PLT 187 09/14/2016   Lab Results  Component Value Date   FERRITIN 53 02/19/2013   IRON 45 02/19/2013   TIBC 320 02/19/2013   UIBC 275 02/19/2013   IRONPCTSAT 14 (L) 02/19/2013   Lab Results  Component Value Date   RBC 4.33 09/14/2016   Lab Results  Component Value Date   KPAFRELGTCHN 1.36 02/11/2015   LAMBDASER 1.09 02/11/2015   KAPLAMBRATIO 1.06 07/20/2016   Lab Results  Component  Value Date   IGGSERUM 905 07/20/2016   IGA 64 (L) 02/11/2015   IGMSERUM 25 07/20/2016   Lab Results  Component Value Date   TOTALPROTELP 6.0 (L) 02/11/2015   ALBUMINELP 3.2 (L) 02/11/2015   A1GS 0.4 (H) 02/11/2015   A2GS 0.9 02/11/2015   BETS 0.5 02/11/2015   BETA2SER 0.3 02/11/2015   GAMS 0.6 (L) 02/11/2015   MSPIKE Not Observed 07/20/2016   SPEI * 02/11/2015     Chemistry  Component Value Date/Time   NA 138 07/20/2016 1131   NA 141 06/05/2015 1036   K 3.6 07/20/2016 1131   K 2.9 (LL) 06/05/2015 1036   CL 106 07/20/2016 1131   CO2 27 07/20/2016 1131   CO2 27 06/05/2015 1036   BUN 8 07/20/2016 1131   BUN 13.1 06/05/2015 1036   CREATININE 1.2 07/20/2016 1131   CREATININE 1.0 06/05/2015 1036   GLU 152 03/30/2016      Component Value Date/Time   CALCIUM 9.3 07/20/2016 1131   CALCIUM 9.4 06/05/2015 1036   ALKPHOS 116 (H) 07/20/2016 1131   ALKPHOS 94 06/05/2015 1036   AST 32 07/20/2016 1131   AST 22 06/05/2015 1036   ALT 38 07/20/2016 1131   ALT 18 06/05/2015 1036   BILITOT 0.80 07/20/2016 1131   BILITOT 0.59 06/05/2015 1036      Impression and Plan: [name] [name] is a very pleasant 76 yo caucasian gentleman with history of IgG kappa Myeloma and ASCT at [facility] in February 2017.   Again, going to hold his Zometa. I'm not sure what is going on with this oral pain. I do not know if this is osteonecrosis. The Panorex will help [place].  I would like to see him back in one month. He sees [facility] at the end of August. I will see him back afterwards so that we can see what they have to say.  Possibly, I may decrease his Revlimid dose to 5 mg a day if we see that his myeloma studies are still doing well.  I spent about 30 minutes with he and his wife.   [name], MD 8/1/201810:46 AM

## 2016-09-15 LAB — PROTEIN ELECTROPHORESIS, SERUM
A/G RATIO SPE: 1.1 (ref 0.7–1.7)
ALBUMIN: 3.1 g/dL (ref 2.9–4.4)
Alpha 1: 0.2 g/dL (ref 0.0–0.4)
Alpha 2: 0.6 g/dL (ref 0.4–1.0)
BETA: 0.9 g/dL (ref 0.7–1.3)
GLOBULIN, TOTAL: 2.7 g/dL (ref 2.2–3.9)
Gamma Globulin: 1 g/dL (ref 0.4–1.8)
TOTAL PROTEIN: 5.8 g/dL — AB (ref 6.0–8.5)

## 2016-09-15 LAB — IGG, IGA, IGM
IGA/IMMUNOGLOBULIN A, SERUM: 120 mg/dL (ref 61–437)
IGM (IMMUNOGLOBIN M), SRM: 21 mg/dL (ref 15–143)
IgG, Qn, Serum: 1004 mg/dL (ref 700–1600)

## 2016-09-15 LAB — KAPPA/LAMBDA LIGHT CHAINS
IG KAPPA FREE LIGHT CHAIN: 22.9 mg/L — AB (ref 3.3–19.4)
Ig Lambda Free Light Chain: 17 mg/L (ref 5.7–26.3)
KAPPA/LAMBDA FLC RATIO: 1.35 (ref 0.26–1.65)

## 2016-09-17 ENCOUNTER — Other Ambulatory Visit: Payer: Self-pay | Admitting: Hematology & Oncology

## 2016-09-19 ENCOUNTER — Ambulatory Visit (HOSPITAL_BASED_OUTPATIENT_CLINIC_OR_DEPARTMENT_OTHER)
Admission: RE | Admit: 2016-09-19 | Discharge: 2016-09-19 | Disposition: A | Discharge status: Home / Self Care | Payer: Medicare Other | Source: Ambulatory Visit | Attending: Pulmonary Disease | Admitting: Pulmonary Disease

## 2016-09-19 ENCOUNTER — Other Ambulatory Visit: Payer: Self-pay | Admitting: *Deleted

## 2016-09-19 DIAGNOSIS — J479 Bronchiectasis, uncomplicated: Secondary | ICD-10-CM | POA: Insufficient documentation

## 2016-09-19 DIAGNOSIS — R911 Solitary pulmonary nodule: Secondary | ICD-10-CM

## 2016-09-19 MED ORDER — LENALIDOMIDE 10 MG PO CAPS: ORAL_CAPSULE | ORAL | 0 refills | Status: DC

## 2016-09-27 ENCOUNTER — Encounter: Payer: Self-pay | Admitting: Hematology & Oncology

## 2016-09-28 ENCOUNTER — Telehealth: Payer: Self-pay

## 2016-09-28 NOTE — Telephone Encounter (Signed)
Lm requesting to to bring SD card to OV with RA on 09/29/16 Nothing further needed.

## 2016-09-29 ENCOUNTER — Encounter: Payer: Self-pay | Admitting: Pulmonary Disease

## 2016-09-29 ENCOUNTER — Ambulatory Visit (INDEPENDENT_AMBULATORY_CARE_PROVIDER_SITE_OTHER): Payer: Medicare Other | Admitting: Pulmonary Disease

## 2016-09-29 ENCOUNTER — Other Ambulatory Visit: Payer: Medicare Other

## 2016-09-29 DIAGNOSIS — J432 Centrilobular emphysema: Secondary | ICD-10-CM | POA: Diagnosis not present

## 2016-09-29 DIAGNOSIS — C9000 Multiple myeloma not having achieved remission: Secondary | ICD-10-CM | POA: Diagnosis not present

## 2016-09-29 DIAGNOSIS — I2583 Coronary atherosclerosis due to lipid rich plaque: Secondary | ICD-10-CM

## 2016-09-29 DIAGNOSIS — R911 Solitary pulmonary nodule: Secondary | ICD-10-CM | POA: Diagnosis not present

## 2016-09-29 DIAGNOSIS — G4733 Obstructive sleep apnea (adult) (pediatric): Secondary | ICD-10-CM | POA: Diagnosis not present

## 2016-09-29 DIAGNOSIS — I2510 Atherosclerotic heart disease of native coronary artery without angina pectoris: Secondary | ICD-10-CM | POA: Diagnosis not present

## 2016-09-29 NOTE — Patient Instructions (Signed)
CT chest without contrast in 1 year Call me with any issues CPAP is set at 10 cm and is working well

## 2016-09-29 NOTE — Progress Notes (Signed)
   Subjective:    Patient ID: [name] [name], male    DOB: 08-27-1940, 76 y.o.   MRN: [id number]  HPI  76 year old ex-smoker  For FU of mild COPD, OSA & pulmonary nodules  He quit smoking in 2013, a pack would last him a month and he probably smoked less than 10 pack years, often used a pipe.  He has history of multiple myeloma and underwent bone marrow transplant at [facility] in 03/2015.He has a history of pulmonary embolism in the past and then had a DVT in right lower extremity in 2016 and is  maintained on anticoagulation lifelong with Xarelto,  decreased to 10 mg daily. OSA was diagnosed in 2016 and is being maintained on CPAP of 10 cm  Breathing has remained stable, he is hardly needed to use his albuterol inhaler. He continues to have right-sided pain in his rib cage in his mid axillary line , no radiation or association with breathing. We reviewed his CT chest today. CPAP continues to work well, no problems with mask pressure. Download shows excellent compliance and good control of events with minimal leak   Significant tests/ events  CT angiogram 03/2016 new right upper lobe 9 mm nodule. Left lower lobe 68m nodule first noted in 08/2014 has decreased to 7 mm. CT chest 09/2016 RUL nodule, resolved, LLL 645mnodule unchanged, Focal bronchiectasis left lower lobe  PFTs 02/2015 ratio 57, FEV1 75%, 2.68, FVC 98%, DLCO 67% 10/2015 moderate airway obstruction  Myocardial perfusion imaging showed normal LV function and low risk for ischemia. It showed increased RV uptake suggestive of elevated pulmonary pressures.  Echo 04/2016 normal LV function, normal RVSP      Review of Systems neg for any significant sore throat, dysphagia, itching, sneezing, nasal congestion or excess/ purulent secretions, fever, chills, sweats, unintended wt loss, pleuritic or exertional cp, hempoptysis, orthopnea pnd or change in chronic leg swelling. Also denies presyncope, palpitations, heartburn, abdominal  pain, nausea, vomiting, diarrhea or change in bowel or urinary habits, dysuria,hematuria, rash, arthralgias, visual complaints, headache, numbness weakness or ataxia.     Objective:   Physical Exam   Gen. Pleasant, well-nourished, in no distress ENT - no thrush, no post nasal drip Neck: No JVD, no thyromegaly, no carotid bruits Lungs: no use of accessory muscles, no dullness to percussion, clear without rales or rhonchi  Cardiovascular: Rhythm regular, heart sounds  normal, no murmurs or gallops, no peripheral edema Musculoskeletal: No deformities, no cyanosis or clubbing         Assessment & Plan:

## 2016-09-29 NOTE — Assessment & Plan Note (Signed)
Stable since 2016, likely benign. Obtain follow-up CT in 1 year

## 2016-09-29 NOTE — Assessment & Plan Note (Signed)
Use albuterol on an as-needed basis 

## 2016-09-29 NOTE — Assessment & Plan Note (Signed)
CPAP is set at 10 cm and is working well

## 2016-09-30 ENCOUNTER — Other Ambulatory Visit: Payer: Medicare Other

## 2016-10-10 DIAGNOSIS — C9001 Multiple myeloma in remission: Secondary | ICD-10-CM | POA: Diagnosis not present

## 2016-10-10 DIAGNOSIS — C9000 Multiple myeloma not having achieved remission: Secondary | ICD-10-CM | POA: Diagnosis not present

## 2016-10-10 DIAGNOSIS — Z9484 Stem cells transplant status: Secondary | ICD-10-CM | POA: Diagnosis not present

## 2016-10-11 ENCOUNTER — Encounter: Payer: Self-pay | Admitting: Cardiology

## 2016-10-11 ENCOUNTER — Encounter: Payer: Self-pay | Admitting: Hematology & Oncology

## 2016-10-12 ENCOUNTER — Encounter: Payer: Self-pay | Admitting: Cardiology

## 2016-10-13 ENCOUNTER — Other Ambulatory Visit: Payer: Self-pay | Admitting: *Deleted

## 2016-10-13 DIAGNOSIS — E7800 Pure hypercholesterolemia, unspecified: Secondary | ICD-10-CM

## 2016-10-13 MED ORDER — ATORVASTATIN CALCIUM 40 MG PO TABS: 40.0000 mg | ORAL_TABLET | Freq: Every day | ORAL | 3 refills | Status: DC

## 2016-10-14 ENCOUNTER — Ambulatory Visit: Payer: Medicare Other | Admitting: Hematology & Oncology

## 2016-10-14 ENCOUNTER — Other Ambulatory Visit: Payer: Medicare Other

## 2016-10-14 ENCOUNTER — Ambulatory Visit: Payer: Medicare Other

## 2016-10-24 ENCOUNTER — Other Ambulatory Visit: Payer: Self-pay | Admitting: *Deleted

## 2016-10-24 DIAGNOSIS — Z85828 Personal history of other malignant neoplasm of skin: Secondary | ICD-10-CM | POA: Diagnosis not present

## 2016-10-24 DIAGNOSIS — L218 Other seborrheic dermatitis: Secondary | ICD-10-CM | POA: Diagnosis not present

## 2016-10-24 DIAGNOSIS — L579 Skin changes due to chronic exposure to nonionizing radiation, unspecified: Secondary | ICD-10-CM | POA: Diagnosis not present

## 2016-10-24 DIAGNOSIS — L814 Other melanin hyperpigmentation: Secondary | ICD-10-CM | POA: Diagnosis not present

## 2016-10-24 DIAGNOSIS — D225 Melanocytic nevi of trunk: Secondary | ICD-10-CM | POA: Diagnosis not present

## 2016-10-24 DIAGNOSIS — L821 Other seborrheic keratosis: Secondary | ICD-10-CM | POA: Diagnosis not present

## 2016-10-24 DIAGNOSIS — L738 Other specified follicular disorders: Secondary | ICD-10-CM | POA: Diagnosis not present

## 2016-10-24 MED ORDER — LENALIDOMIDE 10 MG PO CAPS: ORAL_CAPSULE | ORAL | 0 refills | Status: DC

## 2016-11-01 ENCOUNTER — Other Ambulatory Visit: Payer: Self-pay | Admitting: *Deleted

## 2016-11-01 MED ORDER — LENALIDOMIDE 10 MG PO CAPS: ORAL_CAPSULE | ORAL | 0 refills | Status: DC

## 2016-11-03 ENCOUNTER — Other Ambulatory Visit: Payer: Self-pay | Admitting: *Deleted

## 2016-11-03 MED ORDER — LENALIDOMIDE 10 MG PO CAPS: ORAL_CAPSULE | ORAL | 0 refills | Status: DC

## 2016-11-09 ENCOUNTER — Other Ambulatory Visit: Payer: Medicare Other

## 2016-11-09 ENCOUNTER — Ambulatory Visit: Payer: Medicare Other

## 2016-11-09 ENCOUNTER — Ambulatory Visit: Payer: Medicare Other | Admitting: Hematology & Oncology

## 2016-11-11 ENCOUNTER — Ambulatory Visit: Payer: Medicare Other

## 2016-11-11 ENCOUNTER — Other Ambulatory Visit (HOSPITAL_BASED_OUTPATIENT_CLINIC_OR_DEPARTMENT_OTHER): Payer: Medicare Other

## 2016-11-11 ENCOUNTER — Ambulatory Visit (HOSPITAL_BASED_OUTPATIENT_CLINIC_OR_DEPARTMENT_OTHER): Payer: Medicare Other | Admitting: Hematology & Oncology

## 2016-11-11 VITALS — BP 114/57 | HR 53 | Temp 97.7°F | Resp 18 | Wt 199.0 lb

## 2016-11-11 DIAGNOSIS — C9000 Multiple myeloma not having achieved remission: Secondary | ICD-10-CM

## 2016-11-11 DIAGNOSIS — R0781 Pleurodynia: Secondary | ICD-10-CM | POA: Diagnosis not present

## 2016-11-11 DIAGNOSIS — Z9484 Stem cells transplant status: Secondary | ICD-10-CM | POA: Diagnosis not present

## 2016-11-11 DIAGNOSIS — R6884 Jaw pain: Secondary | ICD-10-CM

## 2016-11-11 DIAGNOSIS — I82441 Acute embolism and thrombosis of right tibial vein: Secondary | ICD-10-CM

## 2016-11-11 DIAGNOSIS — Z7901 Long term (current) use of anticoagulants: Secondary | ICD-10-CM | POA: Diagnosis not present

## 2016-11-11 DIAGNOSIS — Z86718 Personal history of other venous thrombosis and embolism: Secondary | ICD-10-CM | POA: Diagnosis not present

## 2016-11-11 DIAGNOSIS — Z86711 Personal history of pulmonary embolism: Secondary | ICD-10-CM

## 2016-11-11 LAB — CBC WITH DIFFERENTIAL (CANCER CENTER ONLY)
BASO#: 0.1 10*3/uL (ref 0.0–0.2)
BASO%: 1.5 % (ref 0.0–2.0)
EOS ABS: 0.1 10*3/uL (ref 0.0–0.5)
EOS%: 3.6 % (ref 0.0–7.0)
HCT: 36.4 % — ABNORMAL LOW (ref 38.7–49.9)
HEMOGLOBIN: 12 g/dL — AB (ref 13.0–17.1)
LYMPH#: 1.3 10*3/uL (ref 0.9–3.3)
LYMPH%: 37.4 % (ref 14.0–48.0)
MCH: 27.6 pg — ABNORMAL LOW (ref 28.0–33.4)
MCHC: 33 g/dL (ref 32.0–35.9)
MCV: 84 fL (ref 82–98)
MONO#: 0.5 10*3/uL (ref 0.1–0.9)
MONO%: 14.8 % — AB (ref 0.0–13.0)
NEUT%: 42.7 % (ref 40.0–80.0)
NEUTROS ABS: 1.4 10*3/uL — AB (ref 1.5–6.5)
PLATELETS: 180 10*3/uL (ref 145–400)
RBC: 4.35 10*6/uL (ref 4.20–5.70)
RDW: 15.4 % (ref 11.1–15.7)
WBC: 3.4 10*3/uL — AB (ref 4.0–10.0)

## 2016-11-11 LAB — CMP (CANCER CENTER ONLY)
ALK PHOS: 119 U/L — AB (ref 26–84)
ALT: 26 U/L (ref 10–47)
AST: 27 U/L (ref 11–38)
Albumin: 3.2 g/dL — ABNORMAL LOW (ref 3.3–5.5)
BILIRUBIN TOTAL: 1.1 mg/dL (ref 0.20–1.60)
BUN, Bld: 13 mg/dL (ref 7–22)
CO2: 30 mEq/L (ref 18–33)
CREATININE: 1.2 mg/dL (ref 0.6–1.2)
Calcium: 8.7 mg/dL (ref 8.0–10.3)
Chloride: 106 mEq/L (ref 98–108)
GLUCOSE: 95 mg/dL (ref 73–118)
Potassium: 3.3 mEq/L (ref 3.3–4.7)
SODIUM: 141 meq/L (ref 128–145)
TOTAL PROTEIN: 6.9 g/dL (ref 6.4–8.1)

## 2016-11-11 LAB — LACTATE DEHYDROGENASE: LDH: 132 U/L (ref 125–245)

## 2016-11-11 NOTE — Progress Notes (Signed)
Hematology and Oncology Follow Up Visit  [name] [name] [id number] Feb 16, 1940 76 y.o. 11/11/2016   Principle Diagnosis:  IgG kappa myeloma - Trisomy 11 by FISH Acute thromboembolism of the right lower leg  Current Therapy:   Revlimid 10mg  po q day (21/28) Zometa 4 mg IV every 6 weeks  - on hold until December secondary to tooth      extraction Xarelto 10 mg by mouth daily  Past Therapy:  Patient s/p cycle 5 of Velcade/Revlimid/Decadron S/P ASCT at [place] on 04/02/2015   Interim History:  [name] [name] is here today with his sweet wife for follow-up.  He is doing okay. He did have a couple teeth taken out a few weeks ago. He is still having some discomfort from the extraction.  He is complaining of pain over on the right side. This is in the lateral mid right rib area.  He did bring me in a lab report from [place]. An IFE report was produced. There was a lambda light chain that was of in determinant significance. I'm not sure exactly what this is significant of. He had a IgG Kappa myeloma. I suppose that he could always have a new clone of plasma cell. We obviously will have to watch this.  He has had no problems with bowels or bladder. He has had no nausea or vomiting. He has had no cough. There's been no fever. He's had no bleeding.   For right now, his performance status is ECOG 1.  Medications:  Allergies as of 11/11/2016      Reactions   Codeine Itching   Hydrocodone-acetaminophen Itching   Mushroom Extract Complex Nausea And Vomiting   Only shitake mushroom  causes this reaction.   Symbicort [budesonide-formoterol Fumarate] Itching      Medication List       Accurate as of 11/11/16 10:49 AM. Always use your most recent med list.          albuterol 108 (90 Base) MCG/ACT inhaler Commonly known as:  PROVENTIL HFA;VENTOLIN HFA Inhale 2 puffs into the lungs every 6 (six) hours as needed for wheezing or shortness of breath.   amLODipine 5 MG tablet Commonly known as:   NORVASC TAKE 1 TABLET EVERY DAY   atorvastatin 40 MG tablet Commonly known as:  LIPITOR Take 1 tablet (40 mg total) by mouth daily.   carvedilol 6.25 MG tablet Commonly known as:  COREG TAKE 1 TABLET TWICE DAILY WITH A MEAL   esomeprazole 20 MG capsule Commonly known as:  NEXIUM Take 20 mg by mouth daily at 12 noon.   famciclovir 500 MG tablet Commonly known as:  FAMVIR TAKE 1 TABLET EVERY DAY   gabapentin 600 MG tablet Commonly known as:  NEURONTIN TAKE 1 TABLET EVERY MORNING  AND TAKE 2 TABLETS EVERY NIGHT   hydrochlorothiazide 12.5 MG capsule Commonly known as:  MICROZIDE TAKE 1 CAPSULE EVERY DAY   isosorbide mononitrate 30 MG 24 hr tablet Commonly known as:  IMDUR TAKE 1 TABLET TWICE DAILY   lenalidomide 10 MG capsule Commonly known as:  REVLIMID TAKE 1 CAPSULE BY MOUTH EVERY DAY FOR 21 DAYS ON THEN 7 DAYS OFF. [id number]   Loratadine 10 MG Caps Take 10 mg by mouth daily as needed (allergies).   LORazepam 1 MG tablet Commonly known as:  ATIVAN TAKE 1 TABLET AT BEDTIME   montelukast 10 MG tablet Commonly known as:  SINGULAIR TAKE 1 TABLET AT BEDTIME   nitroGLYCERIN 0.4 MG SL tablet Commonly known as:  NITROSTAT Place 0.4 mg under the tongue every 5 (five) minutes as needed. For chest pain   oxyCODONE-acetaminophen 10-325 MG tablet Commonly known as:  PERCOCET Take 1 tablet by mouth daily as needed for pain.   Potassium Chloride ER 20 MEQ Tbcr TAKE 1 TABLET TWICE DAILY   Potassium Chloride ER 20 MEQ Tbcr TAKE 1 TABLET TWICE DAILY   pyridoxine 100 MG tablet Commonly known as:  B-6 Take 100 mg by mouth daily.   RA VITAMIN B-12 TR 1000 MCG Tbcr Generic drug:  Cyanocobalamin Take 1 tablet by mouth daily.   rivaroxaban 10 MG Tabs tablet Commonly known as:  XARELTO Take by mouth.   rivaroxaban 20 MG Tabs tablet Commonly known as:  XARELTO Take 10 mg by mouth.            Discharge Care Instructions        Start     Ordered   11/11/16  0000  NM Bone Scan Whole Body    Question Answer Comment  If indicated for the ordered procedure, I authorize the administration of a radiopharmaceutical per Radiology protocol Yes   Preferred imaging location? [facility]   Radiology Contrast Protocol - do NOT remove file path [email or web address]   Reason for Exam additional comments pain in the RIGHT lateral ribs.  myeloma - remission after transplant      11/11/16 1048   11/11/16 0000  CBC with Differential ([facility])     11/11/16 1048   11/11/16 0000  CMP STAT ([place] only)     11/11/16 1049   11/11/16 0000  IgG, IgA, IgM     11/11/16 1049   11/11/16 0000  Kappa/lambda light chains     11/11/16 1049   11/11/16 0000  Serum protein electrophoresis with reflex     11/11/16 1049      Allergies:  Allergies  Allergen Reactions  . Codeine Itching  . Hydrocodone-Acetaminophen Itching  . Mushroom Extract Complex Nausea And Vomiting    Only shitake mushroom  causes this reaction.  . Symbicort [Budesonide-Formoterol Fumarate] Itching    Past Medical History, Surgical history, Social history, and Family History were reviewed and updated.  Review of Systems: As stated in the interim history  Physical Exam:  weight is 199 lb (90.3 kg). His oral temperature is 97.7 F (36.5 C). His blood pressure is 114/57 (abnormal) and his pulse is 53 (abnormal). His respiration is 18 and oxygen saturation is 99%.   Wt Readings from Last 3 Encounters:  11/11/16 199 lb (90.3 kg)  09/29/16 202 lb (91.6 kg)  09/14/16 202 lb (91.6 kg)    I examined [name] [name]. The results of the exam are noted below:   Head and neck exam shows no ocular or oral lesions. He now has full hair regrowth. There are no palpable cervical or supraclavicular lymph nodes. Lungs are clear. Cardiac exam regular rate and rhythm with no murmurs, rubs or bruits. Abdomen is soft. He has good bowel sounds. There is no fluid  wave. There is no palpable liver or spleen tip. Back exam shows no tenderness over the spine, ribs or hips. He is wearing a back brace. Extremities shows no clubbing, cyanosis or edema. There might be some slight swelling of the right lower leg. No obvious venous cord is noted. He has a negative Homans sign with the right leg. He has good pulses in his distal extremities. Neurological exam is nonfocal. Skin exam shows no rashes, ecchymoses  or petechia.   Lab Results  Component Value Date   WBC 3.4 (L) 11/11/2016   HGB 12.0 (L) 11/11/2016   HCT 36.4 (L) 11/11/2016   MCV 84 11/11/2016   PLT 180 11/11/2016   Lab Results  Component Value Date   FERRITIN 53 02/19/2013   IRON 45 02/19/2013   TIBC 320 02/19/2013   UIBC 275 02/19/2013   IRONPCTSAT 14 (L) 02/19/2013   Lab Results  Component Value Date   RBC 4.35 11/11/2016   Lab Results  Component Value Date   KPAFRELGTCHN 1.36 02/11/2015   LAMBDASER 1.09 02/11/2015   KAPLAMBRATIO 1.35 09/14/2016   Lab Results  Component Value Date   IGGSERUM 1,004 09/14/2016   IGA 64 (L) 02/11/2015   IGMSERUM 21 09/14/2016   Lab Results  Component Value Date   TOTALPROTELP 6.0 (L) 02/11/2015   ALBUMINELP 3.2 (L) 02/11/2015   A1GS 0.4 (H) 02/11/2015   A2GS 0.9 02/11/2015   BETS 0.5 02/11/2015   BETA2SER 0.3 02/11/2015   GAMS 0.6 (L) 02/11/2015   MSPIKE Not Observed 09/14/2016   SPEI * 02/11/2015     Chemistry      Component Value Date/Time   NA 141 11/11/2016 0951   NA 141 06/05/2015 1036   K 3.3 11/11/2016 0951   K 2.9 (LL) 06/05/2015 1036   CL 106 11/11/2016 0951   CO2 30 11/11/2016 0951   CO2 27 06/05/2015 1036   BUN 13 11/11/2016 0951   BUN 13.1 06/05/2015 1036   CREATININE 1.2 11/11/2016 0951   CREATININE 1.0 06/05/2015 1036   GLU 152 03/30/2016      Component Value Date/Time   CALCIUM 8.7 11/11/2016 0951   CALCIUM 9.4 06/05/2015 1036   ALKPHOS 119 (H) 11/11/2016 0951   ALKPHOS 94 06/05/2015 1036   AST 27 11/11/2016 0951    AST 22 06/05/2015 1036   ALT 26 11/11/2016 0951   ALT 18 06/05/2015 1036   BILITOT 1.10 11/11/2016 0951   BILITOT 0.59 06/05/2015 1036      Impression and Plan: [name] [name] is a very pleasant 77 yo caucasian gentleman with history of IgG kappa Myeloma and ASCT at [facility] in February 2017.   We will see what the bone scan shows. He does have some point tenderness over in that right lateral rib cage. I cannot find anything that is swollen.  Of note, his last myeloma studies did not show a monoclonal spike.  I will plan to see him back in 6 weeks.   [name], MD 9/28/201810:49 AM

## 2016-11-12 LAB — IGG, IGA, IGM
IgA, Qn, Serum: 147 mg/dL (ref 61–437)
IgG, Qn, Serum: 1159 mg/dL (ref 700–1600)
IgM, Qn, Serum: 21 mg/dL (ref 15–143)

## 2016-11-14 LAB — KAPPA/LAMBDA LIGHT CHAINS
IG LAMBDA FREE LIGHT CHAIN: 21.4 mg/L (ref 5.7–26.3)
Ig Kappa Free Light Chain: 29.1 mg/L — ABNORMAL HIGH (ref 3.3–19.4)
Kappa/Lambda FluidC Ratio: 1.36 (ref 0.26–1.65)

## 2016-11-15 LAB — PROTEIN ELECTROPHORESIS, SERUM, WITH REFLEX
A/G RATIO SPE: 1 (ref 0.7–1.7)
ALBUMIN: 3.3 g/dL (ref 2.9–4.4)
ALPHA 1: 0.2 g/dL (ref 0.0–0.4)
Alpha 2: 0.7 g/dL (ref 0.4–1.0)
Beta: 0.9 g/dL (ref 0.7–1.3)
GAMMA GLOBULIN: 1.3 g/dL (ref 0.4–1.8)
Globulin, Total: 3.2 g/dL (ref 2.2–3.9)
TOTAL PROTEIN: 6.5 g/dL (ref 6.0–8.5)

## 2016-11-16 ENCOUNTER — Telehealth: Payer: Self-pay | Admitting: *Deleted

## 2016-11-16 NOTE — Telephone Encounter (Addendum)
Patient is aware of results  ----- Message from [name], MD sent at 11/16/2016  6:16 AM EDT ----- Call - no myeloma in the blood!!  pete

## 2016-11-20 ENCOUNTER — Other Ambulatory Visit: Payer: Self-pay | Admitting: Hematology & Oncology

## 2016-11-20 ENCOUNTER — Other Ambulatory Visit: Payer: Self-pay | Admitting: Sports Medicine

## 2016-11-22 ENCOUNTER — Other Ambulatory Visit: Payer: Self-pay | Admitting: *Deleted

## 2016-11-22 ENCOUNTER — Encounter: Payer: Self-pay | Admitting: Hematology & Oncology

## 2016-11-22 MED ORDER — LENALIDOMIDE 10 MG PO CAPS: ORAL_CAPSULE | ORAL | 0 refills | Status: DC

## 2016-11-25 ENCOUNTER — Ambulatory Visit (HOSPITAL_COMMUNITY)
Admission: RE | Admit: 2016-11-25 | Discharge: 2016-11-25 | Disposition: A | Discharge status: Home / Self Care | Payer: Medicare Other | Source: Ambulatory Visit | Attending: Hematology & Oncology | Admitting: Hematology & Oncology

## 2016-11-25 ENCOUNTER — Encounter (HOSPITAL_COMMUNITY)
Admission: RE | Admit: 2016-11-25 | Discharge: 2016-11-25 | Disposition: A | Discharge status: Home / Self Care | Payer: Medicare Other | Source: Ambulatory Visit | Attending: Hematology & Oncology | Admitting: Hematology & Oncology

## 2016-11-25 DIAGNOSIS — R0781 Pleurodynia: Secondary | ICD-10-CM | POA: Diagnosis not present

## 2016-11-25 DIAGNOSIS — Z8579 Personal history of other malignant neoplasms of lymphoid, hematopoietic and related tissues: Secondary | ICD-10-CM | POA: Diagnosis not present

## 2016-11-25 DIAGNOSIS — C9000 Multiple myeloma not having achieved remission: Secondary | ICD-10-CM | POA: Diagnosis not present

## 2016-11-25 MED ORDER — TECHNETIUM TC 99M MEDRONATE IV KIT
20.8000 | PACK | Freq: Once | INTRAVENOUS | Status: AC | PRN
  Administered 2016-11-25: 20.8 via INTRAVENOUS

## 2016-11-28 ENCOUNTER — Telehealth: Payer: Self-pay | Admitting: *Deleted

## 2016-11-28 NOTE — Telephone Encounter (Addendum)
Patient is aware of results  ----- Message from [name], MD sent at 11/25/2016  2:07 PM EDT ----- Call - the bone scan looks ok!!! NO obvious bone/rib disease!!!  [name] [name]

## 2016-12-02 ENCOUNTER — Other Ambulatory Visit (HOSPITAL_COMMUNITY): Payer: Medicare Other

## 2016-12-02 ENCOUNTER — Encounter (HOSPITAL_COMMUNITY): Payer: Medicare Other

## 2016-12-04 ENCOUNTER — Other Ambulatory Visit: Payer: Self-pay | Admitting: Family

## 2016-12-07 ENCOUNTER — Ambulatory Visit (INDEPENDENT_AMBULATORY_CARE_PROVIDER_SITE_OTHER): Payer: Medicare Other | Admitting: Sports Medicine

## 2016-12-07 ENCOUNTER — Encounter: Payer: Self-pay | Admitting: Sports Medicine

## 2016-12-07 VITALS — BP 106/60 | HR 54 | Wt 200.7 lb

## 2016-12-07 DIAGNOSIS — I2583 Coronary atherosclerosis due to lipid rich plaque: Secondary | ICD-10-CM | POA: Diagnosis not present

## 2016-12-07 DIAGNOSIS — E785 Hyperlipidemia, unspecified: Secondary | ICD-10-CM

## 2016-12-07 DIAGNOSIS — R0789 Other chest pain: Secondary | ICD-10-CM | POA: Insufficient documentation

## 2016-12-07 DIAGNOSIS — I2510 Atherosclerotic heart disease of native coronary artery without angina pectoris: Secondary | ICD-10-CM

## 2016-12-07 DIAGNOSIS — Z23 Encounter for immunization: Secondary | ICD-10-CM | POA: Diagnosis not present

## 2016-12-07 DIAGNOSIS — E7800 Pure hypercholesterolemia, unspecified: Secondary | ICD-10-CM | POA: Diagnosis not present

## 2016-12-07 DIAGNOSIS — E559 Vitamin D deficiency, unspecified: Secondary | ICD-10-CM

## 2016-12-07 DIAGNOSIS — E1169 Type 2 diabetes mellitus with other specified complication: Secondary | ICD-10-CM

## 2016-12-07 DIAGNOSIS — K219 Gastro-esophageal reflux disease without esophagitis: Secondary | ICD-10-CM

## 2016-12-07 MED ORDER — PANTOPRAZOLE SODIUM 20 MG PO TBEC: 20.0000 mg | DELAYED_RELEASE_TABLET | Freq: Every day | ORAL | 3 refills | Status: DC

## 2016-12-07 NOTE — Assessment & Plan Note (Signed)
Checking routine labs 

## 2016-12-07 NOTE — Assessment & Plan Note (Signed)
Persistent after a fall several months ago. CT scan from 2 months ago is negative, no evidence of metastatic disease. He is tender at the mid axillary line over the rib, we can consider injection of this area in the future. I would like to get some labs first.

## 2016-12-07 NOTE — Progress Notes (Signed)
  Subjective:    CC: Couple of issues  HPI: GERD: Insurance is not paying for his Nexium he is wondering if we can switch to Protonix.  Chest wall pain: Right-sided ribs below the scapula, mid axillary line, has been present for 3-4 months after a fall.  He did have a CT scan 2 months ago that was negative for any abnormality of the rib cage itself.  Pain is slightly pleuritic but localized over the rib externally.  Past medical history:  Negative.  See flowsheet/record as well for more information.  Surgical history: Negative.  See flowsheet/record as well for more information.  Family history: Negative.  See flowsheet/record as well for more information.  Social history: Negative.  See flowsheet/record as well for more information.  Allergies, and medications have been entered into the medical record, reviewed, and no changes needed.   Review of Systems: No fevers, chills, night sweats, weight loss, chest pain, or shortness of breath.   Objective:    General: Well Developed, well nourished, and in no acute distress.  Neuro: Alert and oriented x3, extra-ocular muscles intact, sensation grossly intact.  HEENT: Normocephalic, atraumatic, pupils equal round reactive to light, neck supple, no masses, no lymphadenopathy, thyroid nonpalpable.  Skin: Warm and dry, no rashes. Cardiac: Regular rate and rhythm, no murmurs rubs or gallops, no lower extremity edema.  Respiratory: Clear to auscultation bilaterally. Not using accessory muscles, speaking in full sentences.  There is discrete tenderness at the mid axillary line over the rib.  Impression and Recommendations:    Chest wall pain Persistent after a fall several months ago. CT scan from 2 months ago is negative, no evidence of metastatic disease. He is tender at the mid axillary line over the rib, we can consider injection of this area in the future. I would like to get some labs first.  Hyperlipidemia Checking routine labs.  GERD  (gastroesophageal reflux disease) Change to pantoprazole.  ___________________________________________ Gwen Her. Dianah Field, M.D., ABFM., CAQSM. Primary Care and [place] Instructor of [place] [employer] [facility] [employer]

## 2016-12-07 NOTE — Assessment & Plan Note (Signed)
Change to pantoprazole.

## 2016-12-15 ENCOUNTER — Other Ambulatory Visit: Payer: Self-pay | Admitting: *Deleted

## 2016-12-15 MED ORDER — LENALIDOMIDE 10 MG PO CAPS: ORAL_CAPSULE | ORAL | 0 refills | Status: DC

## 2016-12-16 ENCOUNTER — Other Ambulatory Visit: Payer: Self-pay | Admitting: Hematology & Oncology

## 2016-12-19 DIAGNOSIS — E559 Vitamin D deficiency, unspecified: Secondary | ICD-10-CM | POA: Diagnosis not present

## 2016-12-19 DIAGNOSIS — E7800 Pure hypercholesterolemia, unspecified: Secondary | ICD-10-CM | POA: Diagnosis not present

## 2016-12-19 DIAGNOSIS — E785 Hyperlipidemia, unspecified: Secondary | ICD-10-CM | POA: Diagnosis not present

## 2016-12-19 DIAGNOSIS — E1169 Type 2 diabetes mellitus with other specified complication: Secondary | ICD-10-CM | POA: Diagnosis not present

## 2016-12-19 LAB — HEPATIC FUNCTION PANEL
AG Ratio: 1.2 (calc) (ref 1.0–2.5)
ALT: 21 U/L (ref 9–46)
AST: 19 U/L (ref 10–35)
Albumin: 3.6 g/dL (ref 3.6–5.1)
Alkaline phosphatase (APISO): 113 U/L (ref 40–115)
BILIRUBIN DIRECT: 0.3 mg/dL — AB (ref 0.0–0.2)
BILIRUBIN INDIRECT: 0.7 mg/dL (ref 0.2–1.2)
BILIRUBIN TOTAL: 1 mg/dL (ref 0.2–1.2)
GLOBULIN: 3.1 g/dL (ref 1.9–3.7)
Total Protein: 6.7 g/dL (ref 6.1–8.1)

## 2016-12-20 LAB — HEMOGLOBIN A1C
Hgb A1c MFr Bld: 5.8 %{Hb} — ABNORMAL HIGH (ref <5.7)
Mean Plasma Glucose: 120 (calc)
eAG (mmol/L): 6.6 (calc)

## 2016-12-20 LAB — CBC
HCT: 35.4 % — ABNORMAL LOW (ref 38.5–50.0)
Hemoglobin: 11.6 g/dL — ABNORMAL LOW (ref 13.2–17.1)
MCH: 26.7 pg — ABNORMAL LOW (ref 27.0–33.0)
MCHC: 32.8 g/dL (ref 32.0–36.0)
MCV: 81.6 fL (ref 80.0–100.0)
MPV: 10.3 fL (ref 7.5–12.5)
Platelets: 178 Thousand/uL (ref 140–400)
RBC: 4.34 10*6/uL (ref 4.20–5.80)
RDW: 15.7 % — ABNORMAL HIGH (ref 11.0–15.0)
WBC: 4.4 Thousand/uL (ref 3.8–10.8)

## 2016-12-20 LAB — COMPREHENSIVE METABOLIC PANEL
AG Ratio: 1.2 (calc) (ref 1.0–2.5)
AST: 18 U/L (ref 10–35)
Albumin: 3.7 g/dL (ref 3.6–5.1)
BUN/Creatinine Ratio: 11 (calc) (ref 6–22)
CO2: 32 mmol/L (ref 20–32)
Chloride: 103 mmol/L (ref 98–110)
Globulin: 3 g/dL (calc) (ref 1.9–3.7)
Glucose, Bld: 103 mg/dL — ABNORMAL HIGH (ref 65–99)
Potassium: 3.4 mmol/L — ABNORMAL LOW (ref 3.5–5.3)

## 2016-12-20 LAB — COMPREHENSIVE METABOLIC PANEL WITH GFR
ALT: 20 U/L (ref 9–46)
Alkaline phosphatase (APISO): 112 U/L (ref 40–115)
BUN: 14 mg/dL (ref 7–25)
Calcium: 8.8 mg/dL (ref 8.6–10.3)
Creat: 1.27 mg/dL — ABNORMAL HIGH (ref 0.70–1.18)
Sodium: 141 mmol/L (ref 135–146)
Total Bilirubin: 1 mg/dL (ref 0.2–1.2)
Total Protein: 6.7 g/dL (ref 6.1–8.1)

## 2016-12-20 LAB — LIPID PANEL W/REFLEX DIRECT LDL
Cholesterol: 69 mg/dL (ref <200)
HDL: 31 mg/dL — ABNORMAL LOW (ref >40)
LDL Cholesterol (Calc): 21 mg/dL
Non-HDL Cholesterol (Calc): 38 mg/dL (calc) (ref <130)
Total CHOL/HDL Ratio: 2.2 (calc) (ref <5.0)
Triglycerides: 83 mg/dL (ref <150)

## 2016-12-20 LAB — TSH: TSH: 1.82 mIU/L (ref 0.40–4.50)

## 2016-12-20 LAB — VITAMIN D 25 HYDROXY (VIT D DEFICIENCY, FRACTURES): Vit D, 25-Hydroxy: 30 ng/mL (ref 30–100)

## 2016-12-23 ENCOUNTER — Encounter: Payer: Self-pay | Admitting: Hematology & Oncology

## 2016-12-23 ENCOUNTER — Other Ambulatory Visit (HOSPITAL_BASED_OUTPATIENT_CLINIC_OR_DEPARTMENT_OTHER): Payer: Medicare Other

## 2016-12-23 ENCOUNTER — Other Ambulatory Visit: Payer: Self-pay

## 2016-12-23 ENCOUNTER — Ambulatory Visit (HOSPITAL_BASED_OUTPATIENT_CLINIC_OR_DEPARTMENT_OTHER): Payer: Medicare Other | Admitting: Hematology & Oncology

## 2016-12-23 VITALS — BP 112/56 | HR 56 | Temp 98.0°F | Resp 18 | Wt 200.8 lb

## 2016-12-23 DIAGNOSIS — Z9484 Stem cells transplant status: Secondary | ICD-10-CM | POA: Diagnosis not present

## 2016-12-23 DIAGNOSIS — Z86711 Personal history of pulmonary embolism: Secondary | ICD-10-CM

## 2016-12-23 DIAGNOSIS — I82441 Acute embolism and thrombosis of right tibial vein: Secondary | ICD-10-CM

## 2016-12-23 DIAGNOSIS — C9000 Multiple myeloma not having achieved remission: Secondary | ICD-10-CM | POA: Diagnosis not present

## 2016-12-23 LAB — CBC WITH DIFFERENTIAL (CANCER CENTER ONLY)
BASO#: 0.1 10*3/uL (ref 0.0–0.2)
BASO%: 1.3 % (ref 0.0–2.0)
EOS%: 5.9 % (ref 0.0–7.0)
Eosinophils Absolute: 0.2 10*3/uL (ref 0.0–0.5)
HEMATOCRIT: 35.8 % — AB (ref 38.7–49.9)
HEMOGLOBIN: 11.8 g/dL — AB (ref 13.0–17.1)
LYMPH#: 1.5 10*3/uL (ref 0.9–3.3)
LYMPH%: 39.1 % (ref 14.0–48.0)
MCH: 27.5 pg — ABNORMAL LOW (ref 28.0–33.4)
MCHC: 33 g/dL (ref 32.0–35.9)
MCV: 83 fL (ref 82–98)
MONO#: 0.5 10*3/uL (ref 0.1–0.9)
MONO%: 13.7 % — ABNORMAL HIGH (ref 0.0–13.0)
NEUT%: 40 % (ref 40.0–80.0)
NEUTROS ABS: 1.5 10*3/uL (ref 1.5–6.5)
Platelets: 165 10*3/uL (ref 145–400)
RBC: 4.29 10*6/uL (ref 4.20–5.70)
RDW: 16.4 % — ABNORMAL HIGH (ref 11.1–15.7)
WBC: 3.7 10*3/uL — ABNORMAL LOW (ref 4.0–10.0)

## 2016-12-23 LAB — CMP (CANCER CENTER ONLY)
ALBUMIN: 3.1 g/dL — AB (ref 3.3–5.5)
ALT(SGPT): 29 U/L (ref 10–47)
AST: 27 U/L (ref 11–38)
Alkaline Phosphatase: 110 U/L — ABNORMAL HIGH (ref 26–84)
BILIRUBIN TOTAL: 1 mg/dL (ref 0.20–1.60)
BUN, Bld: 12 mg/dL (ref 7–22)
CALCIUM: 8.9 mg/dL (ref 8.0–10.3)
CO2: 29 meq/L (ref 18–33)
Chloride: 104 mEq/L (ref 98–108)
Creat: 1.5 mg/dl — ABNORMAL HIGH (ref 0.6–1.2)
GLUCOSE: 109 mg/dL (ref 73–118)
POTASSIUM: 3.3 meq/L (ref 3.3–4.7)
Sodium: 138 mEq/L (ref 128–145)
Total Protein: 6.7 g/dL (ref 6.4–8.1)

## 2016-12-23 LAB — LACTATE DEHYDROGENASE: LDH: 119 U/L — ABNORMAL LOW (ref 125–245)

## 2016-12-23 NOTE — Progress Notes (Signed)
Hematology and Oncology Follow Up Visit  [name] [name] [id number] 09/24/1940 76 y.o. 12/23/2016   Principle Diagnosis:  IgG kappa myeloma - Trisomy 11 by FISH Acute thromboembolism of the right lower leg  Current Therapy:   Revlimid 10mg  po q day (21/28) Zometa 4 mg IV every 6 weeks  - on hold until December secondary to tooth extraction Xarelto 10 mg by mouth daily  Past Therapy:  Patient s/p cycle 5 of Velcade/Revlimid/Decadron S/P ASCT at [facility] on 04/02/2015   Interim History:  [name] [name] is here today for follow-up.  He is doing okay.  We last saw him, he was complaining of some upper back pain.  We did get a bone scan on him.  I do not think this was anything related to his myeloma.  Bone scan did not show anything that looked suspicious.  He may have had some arthritis.  He had his tooth extracted back in August.  We are holding off on his Zometa until December.  His last myeloma studies back in September showed an M spike that was not observed.  His IgG level was 1160 mg/dL.  His Kappa Lightchain was 2.9 mg/dL.  His appetite is doing well.  It sounds like he will have a lot of family up from [place] for Thanksgiving.  He has had no problems with headache.  He has had no change in bowel or bladder habits.  He has had no rashes.  He has had no leg swelling.  For right now, his performance status is ECOG 1.  Medications:  Allergies as of 12/23/2016      Reactions   Codeine Itching   Hydrocodone-acetaminophen Itching   Mushroom Extract Complex Nausea And Vomiting   Only shitake mushroom  causes this reaction.   Symbicort [budesonide-formoterol Fumarate] Itching      Medication List        Accurate as of 12/23/16 10:40 AM. Always use your most recent med list.          albuterol 108 (90 Base) MCG/ACT inhaler Commonly known as:  PROVENTIL HFA;VENTOLIN HFA Inhale 2 puffs into the lungs every 6 (six) hours as needed for wheezing or shortness of breath.   amLODipine  5 MG tablet Commonly known as:  NORVASC TAKE 1 TABLET EVERY DAY   atorvastatin 40 MG tablet Commonly known as:  LIPITOR Take 1 tablet (40 mg total) by mouth daily.   carvedilol 6.25 MG tablet Commonly known as:  COREG TAKE 1 TABLET TWICE DAILY WITH A MEAL   famciclovir 500 MG tablet Commonly known as:  FAMVIR TAKE 1 TABLET EVERY DAY   gabapentin 600 MG tablet Commonly known as:  NEURONTIN TAKE 1 TABLET EVERY MORNING  AND TAKE 2 TABLETS EVERY NIGHT   hydrochlorothiazide 12.5 MG capsule Commonly known as:  MICROZIDE TAKE 1 CAPSULE EVERY DAY   isosorbide mononitrate 30 MG 24 hr tablet Commonly known as:  IMDUR TAKE 1 TABLET TWICE DAILY   KLOR-CON M20 20 MEQ tablet Generic drug:  potassium chloride SA TAKE 1 TABLET TWICE DAILY   lenalidomide 10 MG capsule Commonly known as:  REVLIMID TAKE 1 CAPSULE BY MOUTH EVERY DAY FOR 21 DAYS ON THEN 7 DAYS OFF. [id number]   REVLIMID 10 MG capsule Generic drug:  lenalidomide TAKE 1 CAPSULE BY MOUTH EVERY DAY FOR 21 DAYS ON AND 7 DAYS OFF   Loratadine 10 MG Caps Take 10 mg by mouth daily as needed (allergies).   LORazepam 1 MG tablet Commonly known as:  ATIVAN TAKE 1 TABLET AT BEDTIME   montelukast 10 MG tablet Commonly known as:  SINGULAIR TAKE 1 TABLET AT BEDTIME   nitroGLYCERIN 0.4 MG SL tablet Commonly known as:  NITROSTAT Place 0.4 mg under the tongue every 5 (five) minutes as needed. For chest pain   oxyCODONE-acetaminophen 10-325 MG tablet Commonly known as:  PERCOCET Take 1 tablet by mouth daily as needed for pain.   pantoprazole 20 MG tablet Commonly known as:  PROTONIX Take 1 tablet (20 mg total) by mouth daily.   Potassium Chloride ER 20 MEQ Tbcr TAKE 1 TABLET TWICE DAILY   Potassium Chloride ER 20 MEQ Tbcr TAKE 1 TABLET TWICE DAILY   pyridoxine 100 MG tablet Commonly known as:  B-6 Take 100 mg by mouth daily.   RA VITAMIN B-12 TR 1000 MCG Tbcr Generic drug:  Cyanocobalamin Take 1 tablet by mouth  daily.   rivaroxaban 10 MG Tabs tablet Commonly known as:  XARELTO Take by mouth.   XARELTO 20 MG Tabs tablet Generic drug:  rivaroxaban TAKE 1 TABLET EVERY DAY WITH SUPPER       Allergies:  Allergies  Allergen Reactions  . Codeine Itching  . Hydrocodone-Acetaminophen Itching  . Mushroom Extract Complex Nausea And Vomiting    Only shitake mushroom  causes this reaction.  . Symbicort [Budesonide-Formoterol Fumarate] Itching    Past Medical History, Surgical history, Social history, and Family History were reviewed and updated.  Review of Systems: As stated in the interim history  Physical Exam:  weight is 200 lb 12.8 oz (91.1 kg). His oral temperature is 98 F (36.7 C). His blood pressure is 112/56 (abnormal) and his pulse is 56 (abnormal). His respiration is 18 and oxygen saturation is 98%.   Wt Readings from Last 3 Encounters:  12/23/16 200 lb 12.8 oz (91.1 kg)  12/07/16 200 lb 11.2 oz (91 kg)  11/11/16 199 lb (90.3 kg)    I examined [name] [name]. The results of the exam are noted below:   Head and neck exam shows no ocular or oral lesions. He now has full hair regrowth. There are no palpable cervical or supraclavicular lymph nodes. Lungs are clear. Cardiac exam regular rate and rhythm with no murmurs, rubs or bruits. Abdomen is soft. He has good bowel sounds. There is no fluid wave. There is no palpable liver or spleen tip. Back exam shows no tenderness over the spine, ribs or hips. He is wearing a back brace. Extremities shows no clubbing, cyanosis or edema. There might be some slight swelling of the right lower leg. No obvious venous cord is noted. He has a negative Homans sign with the right leg. He has good pulses in his distal extremities. Neurological exam is nonfocal. Skin exam shows no rashes, ecchymoses or petechia.   Lab Results  Component Value Date   WBC 3.7 (L) 12/23/2016   HGB 11.8 (L) 12/23/2016   HCT 35.8 (L) 12/23/2016   MCV 83 12/23/2016   PLT 165  12/23/2016   Lab Results  Component Value Date   FERRITIN 53 02/19/2013   IRON 45 02/19/2013   TIBC 320 02/19/2013   UIBC 275 02/19/2013   IRONPCTSAT 14 (L) 02/19/2013   Lab Results  Component Value Date   RBC 4.29 12/23/2016   Lab Results  Component Value Date   KPAFRELGTCHN 1.36 02/11/2015   LAMBDASER 1.09 02/11/2015   KAPLAMBRATIO 1.36 11/11/2016   Lab Results  Component Value Date   IGGSERUM 1,159 11/11/2016   IGA  64 (L) 02/11/2015   IGMSERUM 21 11/11/2016   Lab Results  Component Value Date   TOTALPROTELP 6.0 (L) 02/11/2015   ALBUMINELP 3.2 (L) 02/11/2015   A1GS 0.4 (H) 02/11/2015   A2GS 0.9 02/11/2015   BETS 0.5 02/11/2015   BETA2SER 0.3 02/11/2015   GAMS 0.6 (L) 02/11/2015   MSPIKE Not Observed 11/11/2016   SPEI * 02/11/2015     Chemistry      Component Value Date/Time   NA 138 12/23/2016 0958   NA 141 06/05/2015 1036   K 3.3 12/23/2016 0958   K 2.9 (LL) 06/05/2015 1036   CL 104 12/23/2016 0958   CO2 29 12/23/2016 0958   CO2 27 06/05/2015 1036   BUN 12 12/23/2016 0958   BUN 13.1 06/05/2015 1036   CREATININE 1.5 (H) 12/23/2016 0958   CREATININE 1.0 06/05/2015 1036   GLU 152 03/30/2016      Component Value Date/Time   CALCIUM 8.9 12/23/2016 0958   CALCIUM 9.4 06/05/2015 1036   ALKPHOS 110 (H) 12/23/2016 0958   ALKPHOS 94 06/05/2015 1036   AST 27 12/23/2016 0958   AST 22 06/05/2015 1036   ALT 29 12/23/2016 0958   ALT 18 06/05/2015 1036   BILITOT 1.00 12/23/2016 0958   BILITOT 0.59 06/05/2015 1036      Impression and Plan: [name] [name] is a very pleasant 76 yo caucasian gentleman with history of IgG kappa Myeloma and ASCT at [facility] in February 2017.   From my point of view, everything looks okay right now.  I am glad that there is no monoclonal spike in his blood.  We will get him back in another 4 weeks.  We will give him Zometa when we see him back.   [name], MD 11/9/201810:40 AM

## 2016-12-24 ENCOUNTER — Other Ambulatory Visit: Payer: Self-pay | Admitting: Nurse Practitioner

## 2016-12-24 LAB — IGG, IGA, IGM
IGA/IMMUNOGLOBULIN A, SERUM: 143 mg/dL (ref 61–437)
IGM (IMMUNOGLOBIN M), SRM: 100 mg/dL (ref 15–143)
IgG, Qn, Serum: 1342 mg/dL (ref 700–1600)

## 2016-12-26 LAB — KAPPA/LAMBDA LIGHT CHAINS
IG KAPPA FREE LIGHT CHAIN: 42.9 mg/L — AB (ref 3.3–19.4)
IG LAMBDA FREE LIGHT CHAIN: 29.5 mg/L — AB (ref 5.7–26.3)
Kappa/Lambda FluidC Ratio: 1.45 (ref 0.26–1.65)

## 2016-12-27 LAB — PROTEIN ELECTROPHORESIS, SERUM, WITH REFLEX
A/G Ratio: 1 (ref 0.7–1.7)
ALPHA 1: 0.2 g/dL (ref 0.0–0.4)
ALPHA 2: 0.7 g/dL (ref 0.4–1.0)
Albumin: 3.2 g/dL (ref 2.9–4.4)
Beta: 0.9 g/dL (ref 0.7–1.3)
Gamma Globulin: 1.4 g/dL (ref 0.4–1.8)
Globulin, Total: 3.2 g/dL (ref 2.2–3.9)
Total Protein: 6.4 g/dL (ref 6.0–8.5)

## 2017-01-03 ENCOUNTER — Other Ambulatory Visit: Payer: Self-pay | Admitting: *Deleted

## 2017-01-03 MED ORDER — LENALIDOMIDE 10 MG PO CAPS: ORAL_CAPSULE | ORAL | 0 refills | Status: DC

## 2017-01-05 ENCOUNTER — Other Ambulatory Visit: Payer: Self-pay | Admitting: Hematology & Oncology

## 2017-01-06 ENCOUNTER — Other Ambulatory Visit: Payer: Self-pay | Admitting: *Deleted

## 2017-01-10 DIAGNOSIS — K921 Melena: Secondary | ICD-10-CM | POA: Diagnosis not present

## 2017-01-10 DIAGNOSIS — I10 Essential (primary) hypertension: Secondary | ICD-10-CM | POA: Diagnosis not present

## 2017-01-10 DIAGNOSIS — K5900 Constipation, unspecified: Secondary | ICD-10-CM | POA: Diagnosis not present

## 2017-01-14 IMAGING — XA DG INJECT/[PERSON_NAME] INC NEEDLE/CATH/PLC EPI/CERV/THOR W/IMG
3 series · 3 of 3 positions shown · non-contrast
Comparison: none

CLINICAL DATA: Spondylosis without myelopathy. Partial improvement
from the previous injections. Bilateral neck pain right more than
left.

[Series 1: ortho standard · 1 of 1 slices shown (1 of 3)]
[im 1/1]
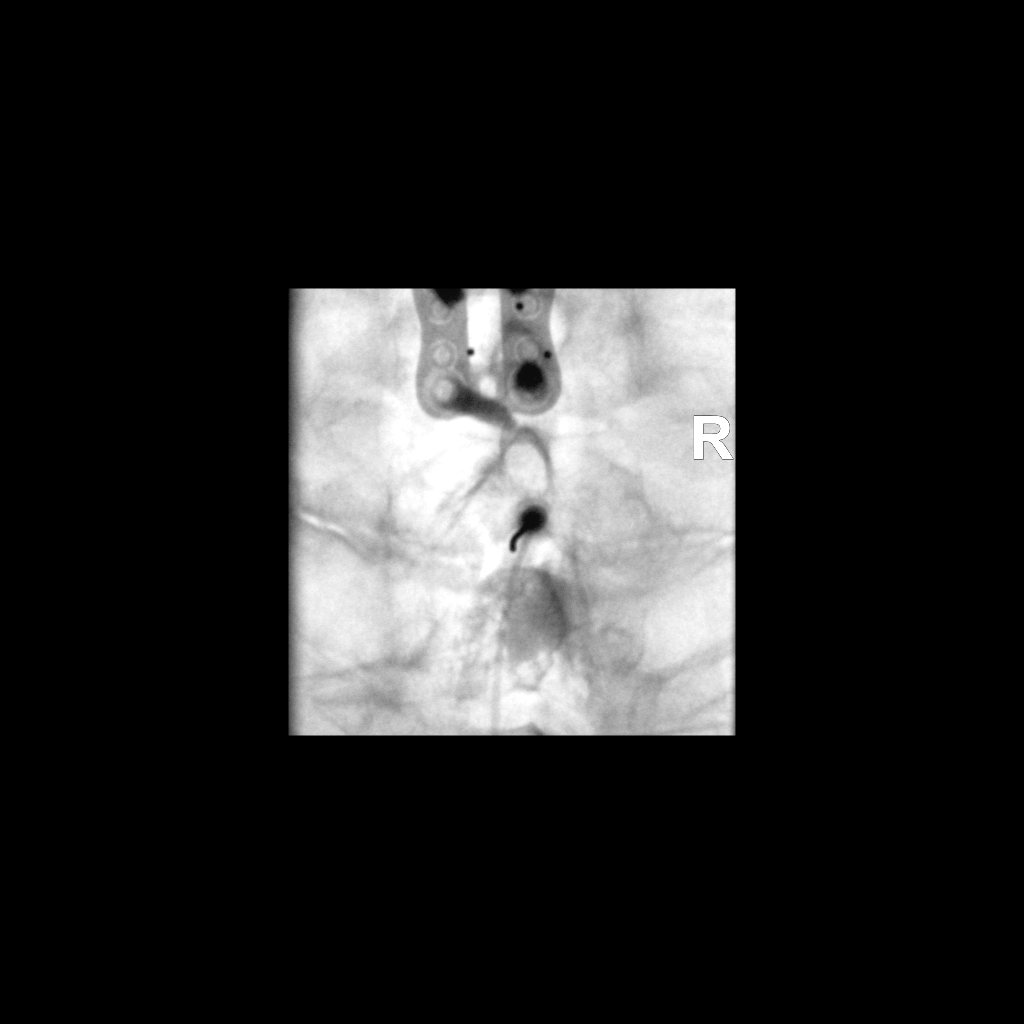

[Series 2: ortho standard · 1 of 1 slices shown (2 of 3)]
[im 1/1]
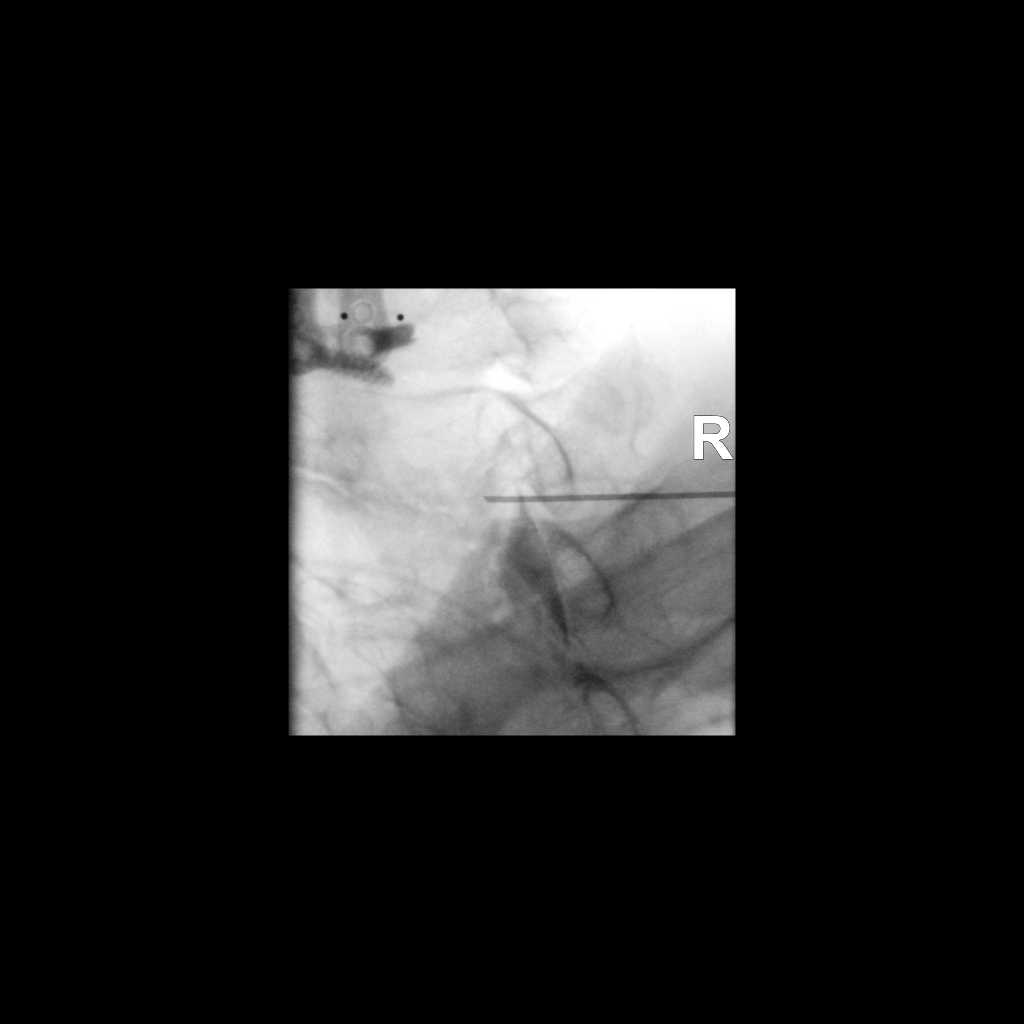

[Series 3: ortho standard · 1 of 1 slices shown (3 of 3)]
[im 1/1]
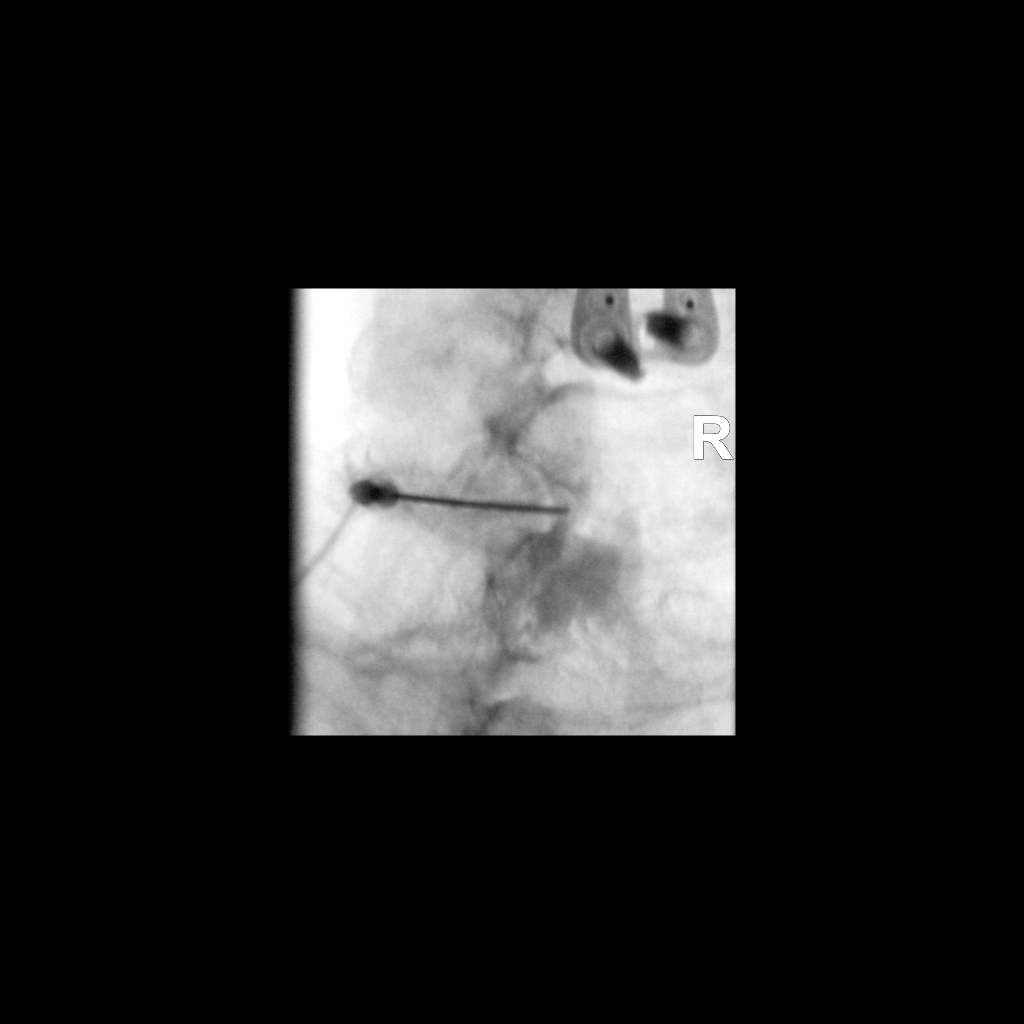

[3 of 3 positions shown; findings below may reference images not displayed]

FLUOROSCOPY TIME:  4 minutes 4 seconds. 198.90 micro gray meter
squared

PROCEDURE:
CERVICAL EPIDURAL INJECTION

An interlaminar approach was performed on the right at C7-T1
initially. Because of an counting osteophytes, eventually had 2
approach just to the left of midline . A 20 gauge epidural needle
was advanced using loss-of-resistance technique.

DIAGNOSTIC EPIDURAL INJECTION

Injection of Isovue-M 300 shows a good epidural pattern with spread
above and below the level of needle placement, extending to both
sides. No vascular opacification is seen. THERAPEUTIC

EPIDURAL INJECTION

1.5 ml of Kenalog 40 mixed with 1 ml of 1% Lidocaine and 2 ml of
normal saline were then instilled. The procedure was well-tolerated,
and the patient was discharged thirty minutes following the
injection in good condition.
IMPRESSION: Technically successful third epidural injection in the midline at
C7-T1.

## 2017-01-24 ENCOUNTER — Other Ambulatory Visit: Payer: Self-pay | Admitting: Sports Medicine

## 2017-01-24 ENCOUNTER — Other Ambulatory Visit: Payer: Self-pay | Admitting: Hematology & Oncology

## 2017-01-25 ENCOUNTER — Other Ambulatory Visit: Payer: Self-pay | Admitting: *Deleted

## 2017-01-25 MED ORDER — LENALIDOMIDE 10 MG PO CAPS: ORAL_CAPSULE | ORAL | 0 refills | Status: DC

## 2017-01-26 ENCOUNTER — Other Ambulatory Visit: Payer: Self-pay | Admitting: Hematology & Oncology

## 2017-01-27 ENCOUNTER — Other Ambulatory Visit (HOSPITAL_BASED_OUTPATIENT_CLINIC_OR_DEPARTMENT_OTHER): Payer: Medicare Other

## 2017-01-27 ENCOUNTER — Other Ambulatory Visit: Payer: Self-pay

## 2017-01-27 ENCOUNTER — Ambulatory Visit (HOSPITAL_BASED_OUTPATIENT_CLINIC_OR_DEPARTMENT_OTHER): Payer: Medicare Other

## 2017-01-27 ENCOUNTER — Ambulatory Visit (HOSPITAL_BASED_OUTPATIENT_CLINIC_OR_DEPARTMENT_OTHER): Payer: Medicare Other | Admitting: Hematology & Oncology

## 2017-01-27 VITALS — BP 117/49 | HR 52 | Temp 97.5°F | Resp 18 | Wt 201.0 lb

## 2017-01-27 DIAGNOSIS — C9000 Multiple myeloma not having achieved remission: Secondary | ICD-10-CM | POA: Diagnosis not present

## 2017-01-27 DIAGNOSIS — M899 Disorder of bone, unspecified: Secondary | ICD-10-CM

## 2017-01-27 DIAGNOSIS — C9001 Multiple myeloma in remission: Secondary | ICD-10-CM

## 2017-01-27 DIAGNOSIS — Z9484 Stem cells transplant status: Secondary | ICD-10-CM | POA: Diagnosis not present

## 2017-01-27 LAB — CBC WITH DIFFERENTIAL (CANCER CENTER ONLY)
BASO#: 0 10*3/uL (ref 0.0–0.2)
BASO%: 1.1 % (ref 0.0–2.0)
EOS ABS: 0.2 10*3/uL (ref 0.0–0.5)
EOS%: 6.5 % (ref 0.0–7.0)
HEMATOCRIT: 37.1 % — AB (ref 38.7–49.9)
HGB: 12.2 g/dL — ABNORMAL LOW (ref 13.0–17.1)
LYMPH#: 1.6 10*3/uL (ref 0.9–3.3)
LYMPH%: 43 % (ref 14.0–48.0)
MCH: 27.3 pg — AB (ref 28.0–33.4)
MCHC: 32.9 g/dL (ref 32.0–35.9)
MCV: 83 fL (ref 82–98)
MONO#: 0.4 10*3/uL (ref 0.1–0.9)
MONO%: 9.9 % (ref 0.0–13.0)
NEUT#: 1.5 10*3/uL (ref 1.5–6.5)
NEUT%: 39.5 % — AB (ref 40.0–80.0)
Platelets: 200 10*3/uL (ref 145–400)
RBC: 4.47 10*6/uL (ref 4.20–5.70)
RDW: 17.1 % — AB (ref 11.1–15.7)
WBC: 3.7 10*3/uL — ABNORMAL LOW (ref 4.0–10.0)

## 2017-01-27 LAB — CMP (CANCER CENTER ONLY)
ALT(SGPT): 33 U/L (ref 10–47)
AST: 27 U/L (ref 11–38)
Albumin: 3.3 g/dL (ref 3.3–5.5)
Alkaline Phosphatase: 126 U/L — ABNORMAL HIGH (ref 26–84)
BILIRUBIN TOTAL: 1 mg/dL (ref 0.20–1.60)
BUN, Bld: 14 mg/dL (ref 7–22)
CALCIUM: 8.9 mg/dL (ref 8.0–10.3)
CHLORIDE: 101 meq/L (ref 98–108)
CO2: 28 meq/L (ref 18–33)
Creat: 1.2 mg/dl (ref 0.6–1.2)
GLUCOSE: 132 mg/dL — AB (ref 73–118)
Potassium: 3.3 mEq/L (ref 3.3–4.7)
SODIUM: 140 meq/L (ref 128–145)
Total Protein: 7 g/dL (ref 6.4–8.1)

## 2017-01-27 MED ORDER — SODIUM CHLORIDE 0.9 % IV SOLN
Freq: Once | INTRAVENOUS | Status: AC
  Administered 2017-01-27: 13:00:00 via INTRAVENOUS

## 2017-01-27 MED ORDER — ZOLEDRONIC ACID 4 MG/100ML IV SOLN
4.0000 mg | Freq: Once | INTRAVENOUS | Status: AC
  Administered 2017-01-27: 4 mg via INTRAVENOUS
  Filled 2017-01-27: qty 100

## 2017-01-28 LAB — IGG, IGA, IGM
IGA/IMMUNOGLOBULIN A, SERUM: 153 mg/dL (ref 61–437)
IGM (IMMUNOGLOBIN M), SRM: 53 mg/dL (ref 15–143)
IgG, Qn, Serum: 1483 mg/dL (ref 700–1600)

## 2017-01-28 LAB — BETA 2 MICROGLOBULIN, SERUM: BETA 2: 2.3 mg/L (ref 0.6–2.4)

## 2017-01-30 LAB — KAPPA/LAMBDA LIGHT CHAINS
IG KAPPA FREE LIGHT CHAIN: 36.8 mg/L — AB (ref 3.3–19.4)
IG LAMBDA FREE LIGHT CHAIN: 25.4 mg/L (ref 5.7–26.3)
KAPPA/LAMBDA FLC RATIO: 1.45 (ref 0.26–1.65)

## 2017-01-30 NOTE — Progress Notes (Signed)
Hematology and Oncology Follow Up Visit  [name] [name] [id number] 1940/07/07 76 y.o. 01/30/2017   Principle Diagnosis:  IgG kappa myeloma - Trisomy 11 by FISH Acute thromboembolism of the right lower leg  Current Therapy:   Revlimid 10mg  po q day (21/28) Zometa 4 mg IV every 6 weeks  - next dose in -02/2017 Xarelto 10 mg by mouth daily  Past Therapy:  Patient s/p cycle 5 of Velcade/Revlimid/Decadron S/P ASCT at [facility] on 04/02/2015   Interim History:  [name] is here today for follow-up.  He is complaining of some back discomfort.  This is in the lower back.  He has some pain radiating to the right hip.  There is no weakness.  He does have a hard time trying to exercise.  There is been no fever.  He has had no chest pain.  Has had no nausea or vomiting.  His last myeloma studies did not show any M spike in the blood.  His IgG level was 1483 mg/dL.  His Kappa Lightchain was 3 mg/dL.  He has had no fever.  He has had no sweats.  There is been no rashes.  He has had no leg swelling.  Overall, his performance status is ECOG 1.  Medications:  Allergies as of 01/27/2017      Reactions   Codeine Itching   Hydrocodone-acetaminophen Itching   Mushroom Extract Complex Nausea And Vomiting   Only shitake mushroom  causes this reaction.   Symbicort [budesonide-formoterol Fumarate] Itching      Medication List        Accurate as of 01/27/17 11:59 PM. Always use your most recent med list.          albuterol 108 (90 Base) MCG/ACT inhaler Commonly known as:  PROVENTIL HFA;VENTOLIN HFA Inhale 2 puffs into the lungs every 6 (six) hours as needed for wheezing or shortness of breath.   amLODipine 5 MG tablet Commonly known as:  NORVASC TAKE 1 TABLET EVERY DAY   aspirin EC 81 MG tablet Take by mouth.   atorvastatin 40 MG tablet Commonly known as:  LIPITOR Take 1 tablet (40 mg total) by mouth daily.   carvedilol 6.25 MG tablet Commonly known as:  COREG TAKE 1 TABLET  TWICE DAILY WITH A MEAL   famciclovir 500 MG tablet Commonly known as:  FAMVIR TAKE 1 TABLET EVERY DAY   gabapentin 600 MG tablet Commonly known as:  NEURONTIN TAKE 1 TABLET EVERY MORNING  AND TAKE 2 TABLETS EVERY NIGHT   hydrochlorothiazide 12.5 MG capsule Commonly known as:  MICROZIDE TAKE 1 CAPSULE EVERY DAY   isosorbide mononitrate 30 MG 24 hr tablet Commonly known as:  IMDUR TAKE 1 TABLET TWICE DAILY   lenalidomide 10 MG capsule Commonly known as:  REVLIMID TAKE 1 CAPSULE BY MOUTH EVERY DAY FOR 21 DAYS ON THEN 7 DAYS OFF. [id number]   Loratadine 10 MG Caps Take 10 mg by mouth daily as needed (allergies).   LORazepam 1 MG tablet Commonly known as:  ATIVAN TAKE 1 TABLET AT BEDTIME   montelukast 10 MG tablet Commonly known as:  SINGULAIR TAKE 1 TABLET AT BEDTIME   nitroGLYCERIN 0.4 MG SL tablet Commonly known as:  NITROSTAT Place 0.4 mg under the tongue every 5 (five) minutes as needed. For chest pain   oxyCODONE-acetaminophen 10-325 MG tablet Commonly known as:  PERCOCET Take 1 tablet by mouth daily as needed for pain.   pantoprazole 20 MG tablet Commonly known as:  PROTONIX Take 1 tablet (  20 mg total) by mouth daily.   Potassium Chloride ER 20 MEQ Tbcr TAKE 1 TABLET TWICE DAILY   pyridoxine 100 MG tablet Commonly known as:  B-6 Take 100 mg by mouth daily.   RA VITAMIN B-12 TR 1000 MCG Tbcr Generic drug:  Cyanocobalamin Take 1 tablet by mouth daily.   XARELTO 20 MG Tabs tablet Generic drug:  rivaroxaban TAKE 1 TABLET EVERY DAY WITH SUPPER       Allergies:  Allergies  Allergen Reactions  . Codeine Itching  . Hydrocodone-Acetaminophen Itching  . Mushroom Extract Complex Nausea And Vomiting    Only shitake mushroom  causes this reaction.  . Symbicort [Budesonide-Formoterol Fumarate] Itching    Past Medical History, Surgical history, Social history, and Family History were reviewed and updated.  Review of Systems: As stated in the interim  history  Physical Exam:  weight is 201 lb (91.2 kg). His oral temperature is 97.5 F (36.4 C) (abnormal). His blood pressure is 117/49 (abnormal) and his pulse is 52 (abnormal). His respiration is 18 and oxygen saturation is 97%.   Wt Readings from Last 3 Encounters:  01/27/17 201 lb (91.2 kg)  12/23/16 200 lb 12.8 oz (91.1 kg)  12/07/16 200 lb 11.2 oz (91 kg)    Physical Exam  Constitutional: He is oriented to person, place, and time.  HENT:  Head: Normocephalic and atraumatic.  Mouth/Throat: Oropharynx is clear and moist.  Eyes: EOM are normal. Pupils are equal, round, and reactive to light.  Neck: Normal range of motion.  Cardiovascular: Normal rate, regular rhythm and normal heart sounds.  Pulmonary/Chest: Effort normal and breath sounds normal.  Abdominal: Soft. Bowel sounds are normal.  Musculoskeletal: Normal range of motion. He exhibits no edema, tenderness or deformity.  Lymphadenopathy:    He has no cervical adenopathy.  Neurological: He is alert and oriented to person, place, and time.  Skin: Skin is warm and dry. No rash noted. No erythema.  Psychiatric: He has a normal mood and affect. His behavior is normal. Judgment and thought content normal.  Vitals reviewed.    Lab Results  Component Value Date   WBC 3.7 (L) 01/27/2017   HGB 12.2 (L) 01/27/2017   HCT 37.1 (L) 01/27/2017   MCV 83 01/27/2017   PLT 200 01/27/2017   Lab Results  Component Value Date   FERRITIN 53 02/19/2013   IRON 45 02/19/2013   TIBC 320 02/19/2013   UIBC 275 02/19/2013   IRONPCTSAT 14 (L) 02/19/2013   Lab Results  Component Value Date   RBC 4.47 01/27/2017   Lab Results  Component Value Date   KPAFRELGTCHN 1.36 02/11/2015   LAMBDASER 1.09 02/11/2015   KAPLAMBRATIO 1.45 12/23/2016   Lab Results  Component Value Date   IGGSERUM 1,483 01/27/2017   IGA 64 (L) 02/11/2015   IGMSERUM 53 01/27/2017   Lab Results  Component Value Date   TOTALPROTELP 6.0 (L) 02/11/2015    ALBUMINELP 3.2 (L) 02/11/2015   A1GS 0.4 (H) 02/11/2015   A2GS 0.9 02/11/2015   BETS 0.5 02/11/2015   BETA2SER 0.3 02/11/2015   GAMS 0.6 (L) 02/11/2015   MSPIKE Not Observed 12/23/2016   SPEI * 02/11/2015     Chemistry      Component Value Date/Time   NA 140 01/27/2017 1044   NA 141 06/05/2015 1036   K 3.3 01/27/2017 1044   K 2.9 (LL) 06/05/2015 1036   CL 101 01/27/2017 1044   CO2 28 01/27/2017 1044   CO2 27  06/05/2015 1036   BUN 14 01/27/2017 1044   BUN 13.1 06/05/2015 1036   CREATININE 1.2 01/27/2017 1044   CREATININE 1.0 06/05/2015 1036   GLU 152 03/30/2016      Component Value Date/Time   CALCIUM 8.9 01/27/2017 1044   CALCIUM 9.4 06/05/2015 1036   ALKPHOS 126 (H) 01/27/2017 1044   ALKPHOS 94 06/05/2015 1036   AST 27 01/27/2017 1044   AST 22 06/05/2015 1036   ALT 33 01/27/2017 1044   ALT 18 06/05/2015 1036   BILITOT 1.00 01/27/2017 1044   BILITOT 0.59 06/05/2015 1036      Impression and Plan: Mr. Markowicz is a very pleasant 76 yo caucasian gentleman with history of IgG kappa Myeloma and ASCT at [facility] in February 2017.   We will have to see what his monoclonal studies show.  I would like to think that he is still in remission.  I am not sure what is going on with his lower back.  I do not think this is anything related to myeloma.  I will go ahead and set up another MRI for him and we will see what that shows.  We have to follow him closely.  I would like to see him back in about 6 weeks and we will see how everything looks.  If, for some reason, he needs surgery for his back, I do not see a problem with this.  As always, they were so kind to me and gave me and my wife some Christmas presents.    [name], MD 12/17/20188:52 AM

## 2017-01-31 ENCOUNTER — Other Ambulatory Visit: Payer: Self-pay | Admitting: Family

## 2017-01-31 DIAGNOSIS — C9000 Multiple myeloma not having achieved remission: Secondary | ICD-10-CM

## 2017-01-31 LAB — PROTEIN ELECTROPHORESIS, SERUM, WITH REFLEX
A/G RATIO SPE: 1 (ref 0.7–1.7)
Albumin: 3.2 g/dL (ref 2.9–4.4)
Alpha 1: 0.2 g/dL (ref 0.0–0.4)
Alpha 2: 0.7 g/dL (ref 0.4–1.0)
BETA: 0.8 g/dL (ref 0.7–1.3)
GAMMA GLOBULIN: 1.5 g/dL (ref 0.4–1.8)
GLOBULIN, TOTAL: 3.2 g/dL (ref 2.2–3.9)
Total Protein: 6.4 g/dL (ref 6.0–8.5)

## 2017-02-01 ENCOUNTER — Telehealth: Payer: Self-pay | Admitting: *Deleted

## 2017-02-01 NOTE — Telephone Encounter (Addendum)
Patient is aware of results  ----- Message from [name], MD sent at 02/01/2017  6:05 AM EST ----- Call - NO myeloma in  The blood!!!  Merry Christmas!!  [name]

## 2017-02-02 ENCOUNTER — Ambulatory Visit (HOSPITAL_COMMUNITY): Payer: Medicare Other

## 2017-02-04 ENCOUNTER — Ambulatory Visit (HOSPITAL_BASED_OUTPATIENT_CLINIC_OR_DEPARTMENT_OTHER)
Admission: RE | Admit: 2017-02-04 | Discharge: 2017-02-04 | Disposition: A | Discharge status: Home / Self Care | Payer: Medicare Other | Source: Ambulatory Visit | Attending: Hematology & Oncology | Admitting: Hematology & Oncology

## 2017-02-04 DIAGNOSIS — M48061 Spinal stenosis, lumbar region without neurogenic claudication: Secondary | ICD-10-CM | POA: Insufficient documentation

## 2017-02-04 DIAGNOSIS — M47816 Spondylosis without myelopathy or radiculopathy, lumbar region: Secondary | ICD-10-CM | POA: Insufficient documentation

## 2017-02-04 DIAGNOSIS — G544 Lumbosacral root disorders, not elsewhere classified: Secondary | ICD-10-CM | POA: Diagnosis not present

## 2017-02-04 DIAGNOSIS — M5136 Other intervertebral disc degeneration, lumbar region: Secondary | ICD-10-CM | POA: Diagnosis not present

## 2017-02-04 DIAGNOSIS — C9000 Multiple myeloma not having achieved remission: Secondary | ICD-10-CM

## 2017-02-04 MED ORDER — GADOBENATE DIMEGLUMINE 529 MG/ML IV SOLN
20.0000 mL | Freq: Once | INTRAVENOUS | Status: AC | PRN
  Administered 2017-02-04: 18 mL via INTRAVENOUS

## 2017-02-08 ENCOUNTER — Telehealth: Payer: Self-pay | Admitting: *Deleted

## 2017-02-08 NOTE — Telephone Encounter (Addendum)
Patient is aware of results. He is interested in the referral. Dr Marin Olp will place referral  ----- Message from [name], MD sent at 02/06/2017  6:44 AM EST ----- Call - NO myeloma, but bad spinal stenosis in the lower back!!  You may need surgery.  I can refer you to a very good spinal surgeon if you wish.  [name] [name]

## 2017-02-13 ENCOUNTER — Telehealth: Payer: Self-pay | Admitting: *Deleted

## 2017-02-13 NOTE — Telephone Encounter (Signed)
Notified patient of appointment made with:  Dr Arnoldo Morale  [address] [place] [address] [phone]  Friday January 4th, at 10am  Patient is aware of appointment, date, time and address of office.

## 2017-02-17 DIAGNOSIS — M545 Low back pain: Secondary | ICD-10-CM | POA: Diagnosis not present

## 2017-02-17 DIAGNOSIS — Z6827 Body mass index (BMI) 27.0-27.9, adult: Secondary | ICD-10-CM | POA: Diagnosis not present

## 2017-02-17 DIAGNOSIS — I10 Essential (primary) hypertension: Secondary | ICD-10-CM | POA: Diagnosis not present

## 2017-02-20 DIAGNOSIS — M5137 Other intervertebral disc degeneration, lumbosacral region: Secondary | ICD-10-CM | POA: Diagnosis not present

## 2017-02-20 DIAGNOSIS — M5136 Other intervertebral disc degeneration, lumbar region: Secondary | ICD-10-CM | POA: Diagnosis not present

## 2017-02-20 DIAGNOSIS — M545 Low back pain: Secondary | ICD-10-CM | POA: Diagnosis not present

## 2017-02-20 DIAGNOSIS — M6281 Muscle weakness (generalized): Secondary | ICD-10-CM | POA: Diagnosis not present

## 2017-02-23 ENCOUNTER — Telehealth: Payer: Self-pay | Admitting: Hematology & Oncology

## 2017-02-23 DIAGNOSIS — M545 Low back pain: Secondary | ICD-10-CM | POA: Diagnosis not present

## 2017-02-23 DIAGNOSIS — M6281 Muscle weakness (generalized): Secondary | ICD-10-CM | POA: Diagnosis not present

## 2017-02-23 DIAGNOSIS — M5136 Other intervertebral disc degeneration, lumbar region: Secondary | ICD-10-CM | POA: Diagnosis not present

## 2017-02-23 DIAGNOSIS — M5137 Other intervertebral disc degeneration, lumbosacral region: Secondary | ICD-10-CM | POA: Diagnosis not present

## 2017-02-23 NOTE — Telephone Encounter (Signed)
[employer] [facility] is pleased to inform you that yo have been approved for a grant through the Multiple Myeloma- Medicare Access fund. Your grant amt is $10,000 and your enrollment period is from 01/09/2017 - 01/08/2018  ID: [id number]        COPY SCANNED

## 2017-02-28 DIAGNOSIS — M5136 Other intervertebral disc degeneration, lumbar region: Secondary | ICD-10-CM | POA: Diagnosis not present

## 2017-02-28 DIAGNOSIS — M545 Low back pain: Secondary | ICD-10-CM | POA: Diagnosis not present

## 2017-02-28 DIAGNOSIS — M6281 Muscle weakness (generalized): Secondary | ICD-10-CM | POA: Diagnosis not present

## 2017-02-28 DIAGNOSIS — M5137 Other intervertebral disc degeneration, lumbosacral region: Secondary | ICD-10-CM | POA: Diagnosis not present

## 2017-03-02 DIAGNOSIS — M5137 Other intervertebral disc degeneration, lumbosacral region: Secondary | ICD-10-CM | POA: Diagnosis not present

## 2017-03-02 DIAGNOSIS — M6281 Muscle weakness (generalized): Secondary | ICD-10-CM | POA: Diagnosis not present

## 2017-03-02 DIAGNOSIS — M545 Low back pain: Secondary | ICD-10-CM | POA: Diagnosis not present

## 2017-03-02 DIAGNOSIS — M5136 Other intervertebral disc degeneration, lumbar region: Secondary | ICD-10-CM | POA: Diagnosis not present

## 2017-03-04 ENCOUNTER — Other Ambulatory Visit: Payer: Self-pay | Admitting: Hematology & Oncology

## 2017-03-06 DIAGNOSIS — M47816 Spondylosis without myelopathy or radiculopathy, lumbar region: Secondary | ICD-10-CM | POA: Diagnosis not present

## 2017-03-06 DIAGNOSIS — M48062 Spinal stenosis, lumbar region with neurogenic claudication: Secondary | ICD-10-CM | POA: Diagnosis not present

## 2017-03-07 ENCOUNTER — Other Ambulatory Visit: Payer: Self-pay | Admitting: *Deleted

## 2017-03-07 MED ORDER — LENALIDOMIDE 10 MG PO CAPS: ORAL_CAPSULE | ORAL | 0 refills | Status: DC

## 2017-03-08 ENCOUNTER — Other Ambulatory Visit: Payer: Self-pay | Admitting: *Deleted

## 2017-03-08 MED ORDER — LENALIDOMIDE 10 MG PO CAPS: ORAL_CAPSULE | ORAL | 0 refills | Status: DC

## 2017-03-09 DIAGNOSIS — M545 Low back pain: Secondary | ICD-10-CM | POA: Diagnosis not present

## 2017-03-09 DIAGNOSIS — M5137 Other intervertebral disc degeneration, lumbosacral region: Secondary | ICD-10-CM | POA: Diagnosis not present

## 2017-03-09 DIAGNOSIS — M6281 Muscle weakness (generalized): Secondary | ICD-10-CM | POA: Diagnosis not present

## 2017-03-09 DIAGNOSIS — M5136 Other intervertebral disc degeneration, lumbar region: Secondary | ICD-10-CM | POA: Diagnosis not present

## 2017-03-11 ENCOUNTER — Encounter: Payer: Self-pay | Admitting: Hematology & Oncology

## 2017-03-14 ENCOUNTER — Other Ambulatory Visit: Payer: Self-pay

## 2017-03-14 ENCOUNTER — Inpatient Hospital Stay: Payer: Medicare Other

## 2017-03-14 ENCOUNTER — Inpatient Hospital Stay: Payer: Medicare Other | Attending: Hematology & Oncology | Admitting: Hematology & Oncology

## 2017-03-14 VITALS — BP 112/56 | HR 49

## 2017-03-14 VITALS — BP 97/47 | HR 50 | Temp 97.7°F | Resp 20 | Wt 199.0 lb

## 2017-03-14 DIAGNOSIS — M899 Disorder of bone, unspecified: Secondary | ICD-10-CM

## 2017-03-14 DIAGNOSIS — C9000 Multiple myeloma not having achieved remission: Secondary | ICD-10-CM | POA: Diagnosis not present

## 2017-03-14 DIAGNOSIS — C9001 Multiple myeloma in remission: Secondary | ICD-10-CM | POA: Diagnosis not present

## 2017-03-14 DIAGNOSIS — I82441 Acute embolism and thrombosis of right tibial vein: Secondary | ICD-10-CM

## 2017-03-14 DIAGNOSIS — Z9484 Stem cells transplant status: Secondary | ICD-10-CM | POA: Diagnosis not present

## 2017-03-14 DIAGNOSIS — Z86711 Personal history of pulmonary embolism: Secondary | ICD-10-CM

## 2017-03-14 LAB — CMP (CANCER CENTER ONLY)
ALT: 29 U/L (ref 0–55)
ANION GAP: 7 (ref 5–15)
AST: 28 U/L (ref 5–34)
Albumin: 3.2 g/dL — ABNORMAL LOW (ref 3.5–5.0)
Alkaline Phosphatase: 133 U/L — ABNORMAL HIGH (ref 26–84)
BILIRUBIN TOTAL: 0.8 mg/dL (ref 0.2–1.2)
BUN: 16 mg/dL (ref 7–22)
CALCIUM: 8.5 mg/dL (ref 8.0–10.3)
CO2: 29 mmol/L (ref 18–33)
Chloride: 107 mmol/L (ref 98–108)
Creatinine: 1.4 mg/dL — ABNORMAL HIGH (ref 0.70–1.30)
Glucose, Bld: 87 mg/dL (ref 70–118)
Potassium: 3.4 mmol/L (ref 3.3–4.7)
Sodium: 143 mmol/L (ref 128–145)
Total Protein: 7.2 g/dL (ref 6.4–8.1)

## 2017-03-14 LAB — CBC WITH DIFFERENTIAL (CANCER CENTER ONLY)
BASOS ABS: 0 10*3/uL (ref 0.0–0.1)
Basophils Relative: 1 %
Eosinophils Absolute: 0.2 10*3/uL (ref 0.0–0.5)
Eosinophils Relative: 6 %
HEMATOCRIT: 35.5 % — AB (ref 38.7–49.9)
Hemoglobin: 11.6 g/dL — ABNORMAL LOW (ref 13.0–17.1)
LYMPHS ABS: 1.4 10*3/uL (ref 0.9–3.3)
LYMPHS PCT: 49 %
MCH: 27.5 pg — ABNORMAL LOW (ref 28.0–33.4)
MCHC: 32.7 g/dL (ref 32.0–35.9)
MCV: 84.1 fL (ref 82.0–98.0)
MONO ABS: 0.5 10*3/uL (ref 0.1–0.9)
Monocytes Relative: 17 %
NEUTROS ABS: 0.7 10*3/uL — AB (ref 1.5–6.5)
Neutrophils Relative %: 27 %
Platelet Count: 203 10*3/uL (ref 140–400)
RBC: 4.22 MIL/uL (ref 4.20–5.70)
RDW: 17.1 % — AB (ref 11.1–15.7)
WBC Count: 2.8 10*3/uL — ABNORMAL LOW (ref 4.0–10.3)

## 2017-03-14 LAB — LACTATE DEHYDROGENASE: LDH: 123 U/L — ABNORMAL LOW (ref 125–245)

## 2017-03-14 MED ORDER — ZOLEDRONIC ACID 4 MG/100ML IV SOLN
4.0000 mg | Freq: Once | INTRAVENOUS | Status: AC
  Administered 2017-03-14: 4 mg via INTRAVENOUS
  Filled 2017-03-14: qty 100

## 2017-03-14 NOTE — Progress Notes (Signed)
Hematology and Oncology Follow Up Visit  [name] [name] [id number] 19-Dec-1940 77 y.o. 03/14/2017   Principle Diagnosis:  IgG kappa myeloma - Trisomy 11 by FISH Acute thromboembolism of the right lower leg  Current Therapy:   Revlimid 10mg  po q day (21/28) Zometa 4 mg IV every 6 weeks  - next dose in -04/2017 Xarelto 10 mg by mouth daily  Past Therapy:  Patient s/p cycle 5 of Velcade/Revlimid/Decadron S/P ASCT at [place] on 04/02/2015   Interim History:  [name] [name] is here today for follow-up.  Unfortunately, his poor back is causing him more problems.  We did do an MRI.  Thankfully, the MRI did not show any myelomatous involvement of his lower spine.  He has a lot of arthritis and some spinal stenosis.  He took some physical therapy.  This helped transiently.  Tomorrow, he goes for some epidural injections.  Again, myeloma has not been a problem.  There is not been an M spike that we have noted in his serum.  Back in December, his IgG level was 1483 mg/dL.  His kappa light chain was 3.7 mg/dL.  His appetite is good.  He had a little bit of a cough.  He has had no fever.  He has had no nausea or vomiting.  There has been no leg swelling.  He has had no rashes.  Overall, his performance status is ECOG 1.   Medications:  Allergies as of 03/14/2017      Reactions   Codeine Itching   Hydrocodone-acetaminophen Itching   Mushroom Extract Complex Nausea And Vomiting   Only shitake mushroom  causes this reaction.   Symbicort [budesonide-formoterol Fumarate] Itching      Medication List        Accurate as of 03/14/17 10:52 AM. Always use your most recent med list.          albuterol 108 (90 Base) MCG/ACT inhaler Commonly known as:  PROVENTIL HFA;VENTOLIN HFA Inhale 2 puffs into the lungs every 6 (six) hours as needed for wheezing or shortness of breath.   amLODipine 5 MG tablet Commonly known as:  NORVASC TAKE 1 TABLET EVERY DAY   aspirin EC 81 MG tablet Take by  mouth.   atorvastatin 40 MG tablet Commonly known as:  LIPITOR Take 1 tablet (40 mg total) by mouth daily.   carvedilol 6.25 MG tablet Commonly known as:  COREG TAKE 1 TABLET TWICE DAILY WITH A MEAL   esomeprazole 20 MG capsule Commonly known as:  NEXIUM Take 20 mg by mouth daily at 12 noon.   famciclovir 500 MG tablet Commonly known as:  FAMVIR TAKE 1 TABLET EVERY DAY   gabapentin 600 MG tablet Commonly known as:  NEURONTIN TAKE 1 TABLET EVERY MORNING  AND TAKE 2 TABLETS EVERY NIGHT   hydrochlorothiazide 12.5 MG capsule Commonly known as:  MICROZIDE TAKE 1 CAPSULE EVERY DAY   isosorbide mononitrate 30 MG 24 hr tablet Commonly known as:  IMDUR TAKE 1 TABLET TWICE DAILY   lenalidomide 10 MG capsule Commonly known as:  REVLIMID TAKE 1 CAPSULE BY MOUTH EVERY DAY FOR 21 DAYS ON THEN 7 DAYS OFF [id number]   Loratadine 10 MG Caps Take 10 mg by mouth daily as needed (allergies).   LORazepam 1 MG tablet Commonly known as:  ATIVAN TAKE 1 TABLET AT BEDTIME   montelukast 10 MG tablet Commonly known as:  SINGULAIR TAKE 1 TABLET AT BEDTIME   nitroGLYCERIN 0.4 MG SL tablet Commonly known as:  NITROSTAT Place  0.4 mg under the tongue every 5 (five) minutes as needed. For chest pain   oxyCODONE-acetaminophen 10-325 MG tablet Commonly known as:  PERCOCET Take 1 tablet by mouth daily as needed for pain.   Potassium Chloride ER 20 MEQ Tbcr TAKE 1 TABLET TWICE DAILY   pyridoxine 100 MG tablet Commonly known as:  B-6 Take 100 mg by mouth daily.   RA VITAMIN B-12 TR 1000 MCG Tbcr Generic drug:  Cyanocobalamin Take 1 tablet by mouth daily.   XARELTO 20 MG Tabs tablet Generic drug:  rivaroxaban TAKE 1 TABLET EVERY DAY WITH SUPPER       Allergies:  Allergies  Allergen Reactions  . Codeine Itching  . Hydrocodone-Acetaminophen Itching  . Mushroom Extract Complex Nausea And Vomiting    Only shitake mushroom  causes this reaction.  . Symbicort [Budesonide-Formoterol  Fumarate] Itching    Past Medical History, Surgical history, Social history, and Family History were reviewed and updated.  Review of Systems: Review of Systems  Respiratory: Positive for cough.   Musculoskeletal: Positive for back pain.  All other systems reviewed and are negative.    Physical Exam:  weight is 199 lb (90.3 kg). His oral temperature is 97.7 F (36.5 C). His blood pressure is 97/47 (abnormal) and his pulse is 50 (abnormal). His respiration is 20.   Wt Readings from Last 3 Encounters:  03/14/17 199 lb (90.3 kg)  01/27/17 201 lb (91.2 kg)  12/23/16 200 lb 12.8 oz (91.1 kg)    Physical Exam  Constitutional: He is oriented to person, place, and time.  HENT:  Head: Normocephalic and atraumatic.  Mouth/Throat: Oropharynx is clear and moist.  Eyes: EOM are normal. Pupils are equal, round, and reactive to light.  Neck: Normal range of motion.  Cardiovascular: Normal rate, regular rhythm and normal heart sounds.  Pulmonary/Chest: Effort normal and breath sounds normal.  Abdominal: Soft. Bowel sounds are normal.  Musculoskeletal: Normal range of motion. He exhibits no edema, tenderness or deformity.  Lymphadenopathy:    He has no cervical adenopathy.  Neurological: He is alert and oriented to person, place, and time.  Skin: Skin is warm and dry. No rash noted. No erythema.  Psychiatric: He has a normal mood and affect. His behavior is normal. Judgment and thought content normal.  Vitals reviewed.    Lab Results  Component Value Date   WBC 2.8 (L) 03/14/2017   HGB 12.2 (L) 01/27/2017   HCT 35.5 (L) 03/14/2017   MCV 84.1 03/14/2017   PLT 203 03/14/2017   Lab Results  Component Value Date   FERRITIN 53 02/19/2013   IRON 45 02/19/2013   TIBC 320 02/19/2013   UIBC 275 02/19/2013   IRONPCTSAT 14 (L) 02/19/2013   Lab Results  Component Value Date   RBC 4.22 03/14/2017   Lab Results  Component Value Date   KPAFRELGTCHN 1.36 02/11/2015   LAMBDASER 1.09  02/11/2015   KAPLAMBRATIO 1.45 01/27/2017   Lab Results  Component Value Date   IGGSERUM 1,483 01/27/2017   IGA 64 (L) 02/11/2015   IGMSERUM 53 01/27/2017   Lab Results  Component Value Date   TOTALPROTELP 6.0 (L) 02/11/2015   ALBUMINELP 3.2 (L) 02/11/2015   A1GS 0.4 (H) 02/11/2015   A2GS 0.9 02/11/2015   BETS 0.5 02/11/2015   BETA2SER 0.3 02/11/2015   GAMS 0.6 (L) 02/11/2015   MSPIKE Not Observed 01/27/2017   SPEI * 02/11/2015     Chemistry      Component Value Date/Time  NA 143 03/14/2017 0952   NA 140 01/27/2017 1044   NA 141 06/05/2015 1036   K 3.4 03/14/2017 0952   K 3.3 01/27/2017 1044   K 2.9 (LL) 06/05/2015 1036   CL 107 03/14/2017 0952   CL 101 01/27/2017 1044   CO2 29 03/14/2017 0952   CO2 28 01/27/2017 1044   CO2 27 06/05/2015 1036   BUN 16 03/14/2017 0952   BUN 14 01/27/2017 1044   BUN 13.1 06/05/2015 1036   CREATININE 1.2 01/27/2017 1044   CREATININE 1.0 06/05/2015 1036   GLU 152 03/30/2016      Component Value Date/Time   CALCIUM 8.5 03/14/2017 0952   CALCIUM 8.9 01/27/2017 1044   CALCIUM 9.4 06/05/2015 1036   ALKPHOS 133 (H) 03/14/2017 0952   ALKPHOS 126 (H) 01/27/2017 1044   ALKPHOS 94 06/05/2015 1036   AST 28 03/14/2017 0952   AST 22 06/05/2015 1036   ALT 29 03/14/2017 0952   ALT 33 01/27/2017 1044   ALT 18 06/05/2015 1036   BILITOT 0.8 03/14/2017 0952   BILITOT 0.59 06/05/2015 1036      Impression and Plan: Mr. Basher is a very pleasant 77 yo caucasian gentleman with history of IgG kappa Myeloma and ASCT at [facility] in February 2017.   Is now been 2 years that he had transplant.  So far, he still is in remission.  It sounds like, he might need surgery for his back.  If he does, I do not see a problem from the respect of his being on Revlimid.  He will get his Zometa today.  We will see him back in 6 more weeks.  Return we see him back, he gets Zometa.  [name], MD 1/29/201910:52 AM

## 2017-03-14 NOTE — Patient Instructions (Signed)

## 2017-03-15 DIAGNOSIS — M47816 Spondylosis without myelopathy or radiculopathy, lumbar region: Secondary | ICD-10-CM | POA: Diagnosis not present

## 2017-03-15 LAB — IGG, IGA, IGM
IGG (IMMUNOGLOBIN G), SERUM: 1520 mg/dL (ref 700–1600)
IgA: 159 mg/dL (ref 61–437)
IgM (Immunoglobulin M), Srm: 34 mg/dL (ref 15–143)

## 2017-03-15 LAB — KAPPA/LAMBDA LIGHT CHAINS
Kappa free light chain: 46.1 mg/L — ABNORMAL HIGH (ref 3.3–19.4)
Kappa, lambda light chain ratio: 1.58 (ref 0.26–1.65)
LAMDA FREE LIGHT CHAINS: 29.1 mg/L — AB (ref 5.7–26.3)

## 2017-03-16 LAB — PROTEIN ELECTROPHORESIS, SERUM
A/G Ratio: 1.2 (ref 0.7–1.7)
Albumin ELP: 3.6 g/dL (ref 2.9–4.4)
Alpha-1-Globulin: 0.2 g/dL (ref 0.0–0.4)
Alpha-2-Globulin: 0.7 g/dL (ref 0.4–1.0)
BETA GLOBULIN: 0.8 g/dL (ref 0.7–1.3)
GAMMA GLOBULIN: 1.3 g/dL (ref 0.4–1.8)
Globulin, Total: 3.1 g/dL (ref 2.2–3.9)
Total Protein ELP: 6.7 g/dL (ref 6.0–8.5)

## 2017-03-21 ENCOUNTER — Encounter: Payer: Self-pay | Admitting: Hematology & Oncology

## 2017-03-23 DIAGNOSIS — M5136 Other intervertebral disc degeneration, lumbar region: Secondary | ICD-10-CM | POA: Diagnosis not present

## 2017-03-23 DIAGNOSIS — M5137 Other intervertebral disc degeneration, lumbosacral region: Secondary | ICD-10-CM | POA: Diagnosis not present

## 2017-03-23 DIAGNOSIS — M545 Low back pain: Secondary | ICD-10-CM | POA: Diagnosis not present

## 2017-03-23 DIAGNOSIS — M6281 Muscle weakness (generalized): Secondary | ICD-10-CM | POA: Diagnosis not present

## 2017-03-24 ENCOUNTER — Other Ambulatory Visit: Payer: Self-pay | Admitting: *Deleted

## 2017-03-24 MED ORDER — LENALIDOMIDE 10 MG PO CAPS: ORAL_CAPSULE | ORAL | 0 refills | Status: DC

## 2017-03-26 ENCOUNTER — Other Ambulatory Visit: Payer: Self-pay | Admitting: Hematology & Oncology

## 2017-03-31 ENCOUNTER — Encounter: Payer: Self-pay | Admitting: Hematology & Oncology

## 2017-04-03 ENCOUNTER — Encounter: Payer: Self-pay | Admitting: *Deleted

## 2017-04-05 DIAGNOSIS — Z6826 Body mass index (BMI) 26.0-26.9, adult: Secondary | ICD-10-CM | POA: Diagnosis not present

## 2017-04-05 DIAGNOSIS — M47816 Spondylosis without myelopathy or radiculopathy, lumbar region: Secondary | ICD-10-CM | POA: Diagnosis not present

## 2017-04-12 DIAGNOSIS — C9001 Multiple myeloma in remission: Secondary | ICD-10-CM | POA: Diagnosis not present

## 2017-04-12 DIAGNOSIS — G8929 Other chronic pain: Secondary | ICD-10-CM | POA: Diagnosis not present

## 2017-04-12 DIAGNOSIS — G4733 Obstructive sleep apnea (adult) (pediatric): Secondary | ICD-10-CM | POA: Diagnosis not present

## 2017-04-12 DIAGNOSIS — I2510 Atherosclerotic heart disease of native coronary artery without angina pectoris: Secondary | ICD-10-CM | POA: Diagnosis not present

## 2017-04-12 DIAGNOSIS — Z23 Encounter for immunization: Secondary | ICD-10-CM | POA: Diagnosis not present

## 2017-04-12 DIAGNOSIS — K5790 Diverticulosis of intestine, part unspecified, without perforation or abscess without bleeding: Secondary | ICD-10-CM | POA: Diagnosis not present

## 2017-04-12 DIAGNOSIS — Z9484 Stem cells transplant status: Secondary | ICD-10-CM | POA: Diagnosis not present

## 2017-04-12 DIAGNOSIS — I10 Essential (primary) hypertension: Secondary | ICD-10-CM | POA: Diagnosis not present

## 2017-04-12 DIAGNOSIS — K449 Diaphragmatic hernia without obstruction or gangrene: Secondary | ICD-10-CM | POA: Diagnosis not present

## 2017-04-12 DIAGNOSIS — Z9481 Bone marrow transplant status: Secondary | ICD-10-CM | POA: Diagnosis not present

## 2017-04-12 DIAGNOSIS — M545 Low back pain: Secondary | ICD-10-CM | POA: Diagnosis not present

## 2017-04-12 DIAGNOSIS — C9000 Multiple myeloma not having achieved remission: Secondary | ICD-10-CM | POA: Diagnosis not present

## 2017-04-12 DIAGNOSIS — Z9989 Dependence on other enabling machines and devices: Secondary | ICD-10-CM | POA: Diagnosis not present

## 2017-04-14 DIAGNOSIS — I10 Essential (primary) hypertension: Secondary | ICD-10-CM | POA: Diagnosis not present

## 2017-04-14 DIAGNOSIS — K582 Mixed irritable bowel syndrome: Secondary | ICD-10-CM | POA: Diagnosis not present

## 2017-04-15 ENCOUNTER — Other Ambulatory Visit: Payer: Self-pay | Admitting: Hematology & Oncology

## 2017-04-18 ENCOUNTER — Other Ambulatory Visit: Payer: Self-pay | Admitting: *Deleted

## 2017-04-18 MED ORDER — LENALIDOMIDE 10 MG PO CAPS: ORAL_CAPSULE | ORAL | 0 refills | Status: DC

## 2017-04-26 ENCOUNTER — Inpatient Hospital Stay: Payer: Medicare Other

## 2017-04-26 ENCOUNTER — Inpatient Hospital Stay: Payer: Medicare Other | Attending: Hematology & Oncology | Admitting: Hematology & Oncology

## 2017-04-26 ENCOUNTER — Encounter: Payer: Self-pay | Admitting: Hematology & Oncology

## 2017-04-26 ENCOUNTER — Other Ambulatory Visit: Payer: Self-pay

## 2017-04-26 VITALS — BP 115/59 | HR 54 | Temp 97.5°F | Resp 16 | Wt 201.4 lb

## 2017-04-26 DIAGNOSIS — C9001 Multiple myeloma in remission: Secondary | ICD-10-CM

## 2017-04-26 DIAGNOSIS — M899 Disorder of bone, unspecified: Secondary | ICD-10-CM

## 2017-04-26 DIAGNOSIS — Z86718 Personal history of other venous thrombosis and embolism: Secondary | ICD-10-CM

## 2017-04-26 DIAGNOSIS — C9000 Multiple myeloma not having achieved remission: Secondary | ICD-10-CM

## 2017-04-26 DIAGNOSIS — Z7901 Long term (current) use of anticoagulants: Secondary | ICD-10-CM | POA: Insufficient documentation

## 2017-04-26 LAB — CMP (CANCER CENTER ONLY)
ALT: 30 U/L (ref 10–47)
ANION GAP: 4 — AB (ref 5–15)
AST: 27 U/L (ref 11–38)
Albumin: 3.3 g/dL — ABNORMAL LOW (ref 3.5–5.0)
Alkaline Phosphatase: 112 U/L — ABNORMAL HIGH (ref 26–84)
BILIRUBIN TOTAL: 1.1 mg/dL (ref 0.2–1.6)
BUN: 14 mg/dL (ref 7–22)
CALCIUM: 9.1 mg/dL (ref 8.0–10.3)
CO2: 31 mmol/L (ref 18–33)
Chloride: 106 mmol/L (ref 98–108)
Creatinine: 1.2 mg/dL (ref 0.60–1.20)
Glucose, Bld: 82 mg/dL (ref 73–118)
POTASSIUM: 3.5 mmol/L (ref 3.3–4.7)
Sodium: 141 mmol/L (ref 128–145)
TOTAL PROTEIN: 6.9 g/dL (ref 6.4–8.1)

## 2017-04-26 LAB — CBC WITH DIFFERENTIAL (CANCER CENTER ONLY)
BASOS ABS: 0.1 10*3/uL (ref 0.0–0.1)
BASOS PCT: 1 %
Eosinophils Absolute: 0.2 10*3/uL (ref 0.0–0.5)
Eosinophils Relative: 4 %
HEMATOCRIT: 36.6 % — AB (ref 38.7–49.9)
HEMOGLOBIN: 12 g/dL — AB (ref 13.0–17.1)
Lymphocytes Relative: 29 %
Lymphs Abs: 1.6 10*3/uL (ref 0.9–3.3)
MCH: 27.6 pg — ABNORMAL LOW (ref 28.0–33.4)
MCHC: 32.8 g/dL (ref 32.0–35.9)
MCV: 84.3 fL (ref 82.0–98.0)
MONO ABS: 0.6 10*3/uL (ref 0.1–0.9)
Monocytes Relative: 11 %
NEUTROS ABS: 3 10*3/uL (ref 1.5–6.5)
NEUTROS PCT: 55 %
Platelet Count: 167 10*3/uL (ref 145–400)
RBC: 4.34 MIL/uL (ref 4.20–5.70)
RDW: 17.4 % — AB (ref 11.1–15.7)
WBC: 5.5 10*3/uL (ref 4.0–10.0)

## 2017-04-26 MED ORDER — ESOMEPRAZOLE MAGNESIUM 40 MG PO CPDR: 40.0000 mg | DELAYED_RELEASE_CAPSULE | Freq: Every day | ORAL | 6 refills | Status: DC

## 2017-04-26 MED ORDER — ZOLEDRONIC ACID 4 MG/100ML IV SOLN
4.0000 mg | Freq: Once | INTRAVENOUS | Status: AC
  Administered 2017-04-26: 4 mg via INTRAVENOUS
  Filled 2017-04-26: qty 100

## 2017-04-26 NOTE — Progress Notes (Signed)
Hematology and Oncology Follow Up Visit  [name] [name] [id number] Mar 09, 1940 77 y.o. 04/26/2017   Principle Diagnosis:  IgG kappa myeloma - Trisomy 11 by FISH Acute thromboembolism of the right lower leg  Current Therapy:   Revlimid 10mg  po q day (21/28) Zometa 4 mg IV every 6 weeks  - next dose in -04/2017 Xarelto 10 mg by mouth daily  Past Therapy:  Patient s/p cycle 5 of Velcade/Revlimid/Decadron S/P ASCT at [place] on 04/02/2015   Interim History:  [name] [name] is here today for follow-up.  He is doing a little bit better.  His back is feeling a little bit better.  He is getting some injections at the neurosurgeons office.  It sounds like he may get an ablation of 1 of the spinal nerves.  He has had no problems with cough or shortness of breath.  He has had no nausea or vomiting.  [name] [name] does have issues with reflux.  I will call in some Nexium for him (40 mg p.o. daily)  His myeloma studies done back in January did not show monoclonal spike.  His IgG was 1520 mg/dL.  His Kappa Lightchain was 4.6 mg/dL.  He is still doing well on the low-dose Xarelto.  Overall, I would say that his performance status is ECOG 1.    Medications:  Allergies as of 04/26/2017      Reactions   Codeine Itching   Hydrocodone-acetaminophen Itching   Mushroom Extract Complex Nausea And Vomiting   Only shitake mushroom  causes this reaction.   Symbicort [budesonide-formoterol Fumarate] Itching      Medication List        Accurate as of 04/26/17 10:38 AM. Always use your most recent med list.          albuterol 108 (90 Base) MCG/ACT inhaler Commonly known as:  PROVENTIL HFA;VENTOLIN HFA Inhale 2 puffs into the lungs every 6 (six) hours as needed for wheezing or shortness of breath.   amLODipine 5 MG tablet Commonly known as:  NORVASC TAKE 1 TABLET EVERY DAY   aspirin EC 81 MG tablet Take by mouth.   atorvastatin 40 MG tablet Commonly known as:  LIPITOR Take 1 tablet (40 mg total) by  mouth daily.   carvedilol 6.25 MG tablet Commonly known as:  COREG TAKE 1 TABLET TWICE DAILY WITH A MEAL   esomeprazole 20 MG capsule Commonly known as:  NEXIUM Take 20 mg by mouth daily at 12 noon.   famciclovir 500 MG tablet Commonly known as:  FAMVIR TAKE 1 TABLET EVERY DAY   gabapentin 600 MG tablet Commonly known as:  NEURONTIN TAKE 1 TABLET EVERY MORNING  AND TAKE 2 TABLETS EVERY NIGHT   hydrochlorothiazide 12.5 MG capsule Commonly known as:  MICROZIDE TAKE 1 CAPSULE EVERY DAY   isosorbide mononitrate 30 MG 24 hr tablet Commonly known as:  IMDUR TAKE 1 TABLET TWICE DAILY   lenalidomide 10 MG capsule Commonly known as:  REVLIMID TAKE 1 CAPSULE BY MOUTH EVERY DAY FOR 21 DAYS ON THEN 7 DAYS OFF [id number]   Loratadine 10 MG Caps Take 10 mg by mouth daily as needed (allergies).   LORazepam 1 MG tablet Commonly known as:  ATIVAN TAKE 1 TABLET AT BEDTIME   montelukast 10 MG tablet Commonly known as:  SINGULAIR TAKE 1 TABLET AT BEDTIME   nitroGLYCERIN 0.4 MG SL tablet Commonly known as:  NITROSTAT Place 0.4 mg under the tongue every 5 (five) minutes as needed. For chest pain   oxyCODONE-acetaminophen 10-325  MG tablet Commonly known as:  PERCOCET Take 1 tablet by mouth daily as needed for pain.   Potassium Chloride ER 20 MEQ Tbcr TAKE 1 TABLET TWICE DAILY   pyridoxine 100 MG tablet Commonly known as:  B-6 Take 100 mg by mouth daily.   RA VITAMIN B-12 TR 1000 MCG Tbcr Generic drug:  Cyanocobalamin Take 1 tablet by mouth daily.   XARELTO 20 MG Tabs tablet Generic drug:  rivaroxaban TAKE 1 TABLET EVERY DAY WITH SUPPER       Allergies:  Allergies  Allergen Reactions  . Codeine Itching  . Hydrocodone-Acetaminophen Itching  . Mushroom Extract Complex Nausea And Vomiting    Only shitake mushroom  causes this reaction.  . Symbicort [Budesonide-Formoterol Fumarate] Itching    Past Medical History, Surgical history, Social history, and Family History  were reviewed and updated.  Review of Systems: Review of Systems  Respiratory: Positive for cough.   Musculoskeletal: Positive for back pain.  All other systems reviewed and are negative.    Physical Exam:  weight is 201 lb 6.4 oz (91.4 kg). His oral temperature is 97.5 F (36.4 C) (abnormal). His blood pressure is 115/59 (abnormal) and his pulse is 54 (abnormal). His respiration is 16 and oxygen saturation is 98%.   Wt Readings from Last 3 Encounters:  04/26/17 201 lb 6.4 oz (91.4 kg)  03/14/17 199 lb (90.3 kg)  01/27/17 201 lb (91.2 kg)    Physical Exam  Constitutional: He is oriented to person, place, and time.  HENT:  Head: Normocephalic and atraumatic.  Mouth/Throat: Oropharynx is clear and moist.  Eyes: EOM are normal. Pupils are equal, round, and reactive to light.  Neck: Normal range of motion.  Cardiovascular: Normal rate, regular rhythm and normal heart sounds.  Pulmonary/Chest: Effort normal and breath sounds normal.  Abdominal: Soft. Bowel sounds are normal.  Musculoskeletal: Normal range of motion. He exhibits no edema, tenderness or deformity.  Lymphadenopathy:    He has no cervical adenopathy.  Neurological: He is alert and oriented to person, place, and time.  Skin: Skin is warm and dry. No rash noted. No erythema.  Psychiatric: He has a normal mood and affect. His behavior is normal. Judgment and thought content normal.  Vitals reviewed.    Lab Results  Component Value Date   WBC 5.5 04/26/2017   HGB 12.2 (L) 01/27/2017   HCT 36.6 (L) 04/26/2017   MCV 84.3 04/26/2017   PLT 167 04/26/2017   Lab Results  Component Value Date   FERRITIN 53 02/19/2013   IRON 45 02/19/2013   TIBC 320 02/19/2013   UIBC 275 02/19/2013   IRONPCTSAT 14 (L) 02/19/2013   Lab Results  Component Value Date   RBC 4.34 04/26/2017   Lab Results  Component Value Date   KPAFRELGTCHN 46.1 (H) 03/14/2017   LAMBDASER 29.1 (H) 03/14/2017   KAPLAMBRATIO 1.58 03/14/2017    Lab Results  Component Value Date   IGGSERUM 1,520 03/14/2017   IGA 159 03/14/2017   IGMSERUM 34 03/14/2017   Lab Results  Component Value Date   TOTALPROTELP 6.7 03/14/2017   ALBUMINELP 3.6 03/14/2017   A1GS 0.2 03/14/2017   A2GS 0.7 03/14/2017   BETS 0.8 03/14/2017   BETA2SER 0.3 02/11/2015   GAMS 1.3 03/14/2017   MSPIKE Not Observed 03/14/2017   SPEI Comment 03/14/2017     Chemistry      Component Value Date/Time   NA 141 04/26/2017 0946   NA 140 01/27/2017 1044   NA  141 06/05/2015 1036   K 3.5 04/26/2017 0946   K 3.3 01/27/2017 1044   K 2.9 (LL) 06/05/2015 1036   CL 106 04/26/2017 0946   CL 101 01/27/2017 1044   CO2 31 04/26/2017 0946   CO2 28 01/27/2017 1044   CO2 27 06/05/2015 1036   BUN 14 04/26/2017 0946   BUN 14 01/27/2017 1044   BUN 13.1 06/05/2015 1036   CREATININE 1.20 04/26/2017 0946   CREATININE 1.2 01/27/2017 1044   CREATININE 1.0 06/05/2015 1036   GLU 152 03/30/2016      Component Value Date/Time   CALCIUM 9.1 04/26/2017 0946   CALCIUM 8.9 01/27/2017 1044   CALCIUM 9.4 06/05/2015 1036   ALKPHOS 112 (H) 04/26/2017 0946   ALKPHOS 126 (H) 01/27/2017 1044   ALKPHOS 94 06/05/2015 1036   AST 27 04/26/2017 0946   AST 22 06/05/2015 1036   ALT 30 04/26/2017 0946   ALT 33 01/27/2017 1044   ALT 18 06/05/2015 1036   BILITOT 1.1 04/26/2017 0946   BILITOT 0.59 06/05/2015 1036      Impression and Plan: [name] [name] is a very pleasant 76 yo caucasian gentleman with history of IgG kappa Myeloma and ASCT at [facility] in February 2017.   Is now been 2 years that he had transplant.  So far, he still is in remission.  He will get his Zometa today.  I will plan to get him back to see [place] in another 6 weeks.  He will get his Zometa when we see him back.  I think that if everything looks good in 6 weeks, we can probably move his appointments out to 3 months.  [name], MD 3/13/201910:38 AM

## 2017-04-27 LAB — KAPPA/LAMBDA LIGHT CHAINS
KAPPA FREE LGHT CHN: 38.4 mg/L — AB (ref 3.3–19.4)
Kappa, lambda light chain ratio: 1.83 — ABNORMAL HIGH (ref 0.26–1.65)
LAMDA FREE LIGHT CHAINS: 21 mg/L (ref 5.7–26.3)

## 2017-04-27 LAB — IGG, IGA, IGM
IgA: 138 mg/dL (ref 61–437)
IgG (Immunoglobin G), Serum: 1339 mg/dL (ref 700–1600)
IgM (Immunoglobulin M), Srm: 29 mg/dL (ref 15–143)

## 2017-04-28 LAB — PROTEIN ELECTROPHORESIS, SERUM, WITH REFLEX
A/G Ratio: 1.1 (ref 0.7–1.7)
ALBUMIN ELP: 3.4 g/dL (ref 2.9–4.4)
ALPHA-1-GLOBULIN: 0.2 g/dL (ref 0.0–0.4)
ALPHA-2-GLOBULIN: 0.7 g/dL (ref 0.4–1.0)
Beta Globulin: 0.8 g/dL (ref 0.7–1.3)
Gamma Globulin: 1.3 g/dL (ref 0.4–1.8)
Globulin, Total: 3 g/dL (ref 2.2–3.9)
TOTAL PROTEIN ELP: 6.4 g/dL (ref 6.0–8.5)

## 2017-05-03 DIAGNOSIS — Z7901 Long term (current) use of anticoagulants: Secondary | ICD-10-CM | POA: Diagnosis not present

## 2017-05-03 DIAGNOSIS — C9001 Multiple myeloma in remission: Secondary | ICD-10-CM | POA: Diagnosis not present

## 2017-05-03 DIAGNOSIS — Z86718 Personal history of other venous thrombosis and embolism: Secondary | ICD-10-CM | POA: Diagnosis not present

## 2017-05-04 LAB — UPEP/TP, 24-HR URINE
ALPHA 1 UR: 9.9 %
Albumin, U: 29 %
Alpha 2, Urine: 17.7 %
Beta, Urine: 29 %
Gamma Globulin, Urine: 14.4 %
TOTAL PROTEIN, URINE-UPE24: 26 mg/dL
TOTAL VOLUME: 3000
Total Protein, Urine-Ur/day: 780 mg/24 hr — ABNORMAL HIGH (ref 30–150)

## 2017-05-08 ENCOUNTER — Telehealth: Payer: Self-pay | Admitting: *Deleted

## 2017-05-08 NOTE — Telephone Encounter (Addendum)
Patient is aware of results ----- Message from [name], MD sent at 05/07/2017  8:04 PM EDT ----- Call - Urine does not show any myeloma protein!!!  pete

## 2017-05-13 ENCOUNTER — Other Ambulatory Visit: Payer: Self-pay | Admitting: Hematology & Oncology

## 2017-05-15 ENCOUNTER — Other Ambulatory Visit: Payer: Self-pay | Admitting: *Deleted

## 2017-05-15 MED ORDER — LENALIDOMIDE 10 MG PO CAPS: ORAL_CAPSULE | ORAL | 0 refills | Status: DC

## 2017-05-16 ENCOUNTER — Other Ambulatory Visit: Payer: Self-pay | Admitting: Sports Medicine

## 2017-05-16 ENCOUNTER — Other Ambulatory Visit: Payer: Self-pay | Admitting: Hematology & Oncology

## 2017-05-18 ENCOUNTER — Other Ambulatory Visit: Payer: Self-pay | Admitting: *Deleted

## 2017-05-18 MED ORDER — ESOMEPRAZOLE MAGNESIUM 40 MG PO CPDR: 40.0000 mg | DELAYED_RELEASE_CAPSULE | Freq: Every day | ORAL | 2 refills | Status: DC

## 2017-05-26 ENCOUNTER — Other Ambulatory Visit: Payer: Self-pay | Admitting: *Deleted

## 2017-05-26 MED ORDER — ESOMEPRAZOLE MAGNESIUM 40 MG PO CPDR: 40.0000 mg | DELAYED_RELEASE_CAPSULE | Freq: Every day | ORAL | 2 refills | Status: DC

## 2017-06-02 ENCOUNTER — Other Ambulatory Visit: Payer: Self-pay | Admitting: *Deleted

## 2017-06-02 ENCOUNTER — Other Ambulatory Visit: Payer: Self-pay | Admitting: Hematology & Oncology

## 2017-06-02 MED ORDER — LENALIDOMIDE 10 MG PO CAPS: ORAL_CAPSULE | ORAL | 0 refills | Status: DC

## 2017-06-07 ENCOUNTER — Inpatient Hospital Stay: Payer: Medicare Other | Attending: Hematology & Oncology | Admitting: Hematology & Oncology

## 2017-06-07 ENCOUNTER — Inpatient Hospital Stay: Payer: Medicare Other

## 2017-06-07 VITALS — BP 110/54 | HR 52 | Resp 17 | Wt 203.0 lb

## 2017-06-07 VITALS — Temp 98.0°F

## 2017-06-07 DIAGNOSIS — C9001 Multiple myeloma in remission: Secondary | ICD-10-CM | POA: Diagnosis not present

## 2017-06-07 DIAGNOSIS — M898X9 Other specified disorders of bone, unspecified site: Secondary | ICD-10-CM

## 2017-06-07 DIAGNOSIS — R197 Diarrhea, unspecified: Secondary | ICD-10-CM | POA: Diagnosis not present

## 2017-06-07 DIAGNOSIS — M899 Disorder of bone, unspecified: Secondary | ICD-10-CM

## 2017-06-07 DIAGNOSIS — C9000 Multiple myeloma not having achieved remission: Secondary | ICD-10-CM

## 2017-06-07 LAB — CBC WITH DIFFERENTIAL (CANCER CENTER ONLY)
BASOS PCT: 3 %
Basophils Absolute: 0.1 10*3/uL (ref 0.0–0.1)
Eosinophils Absolute: 0.2 10*3/uL (ref 0.0–0.5)
Eosinophils Relative: 6 %
HEMATOCRIT: 36.6 % — AB (ref 38.7–49.9)
Hemoglobin: 12.1 g/dL — ABNORMAL LOW (ref 13.0–17.1)
Lymphocytes Relative: 42 %
Lymphs Abs: 1.4 10*3/uL (ref 0.9–3.3)
MCH: 28.2 pg (ref 28.0–33.4)
MCHC: 33.1 g/dL (ref 32.0–35.9)
MCV: 85.3 fL (ref 82.0–98.0)
MONO ABS: 0.4 10*3/uL (ref 0.1–0.9)
MONOS PCT: 13 %
NEUTROS ABS: 1.2 10*3/uL — AB (ref 1.5–6.5)
Neutrophils Relative %: 36 %
Platelet Count: 168 10*3/uL (ref 145–400)
RBC: 4.29 MIL/uL (ref 4.20–5.70)
RDW: 17.5 % — AB (ref 11.1–15.7)
WBC Count: 3.2 10*3/uL — ABNORMAL LOW (ref 4.0–10.0)

## 2017-06-07 LAB — CMP (CANCER CENTER ONLY)
ALBUMIN: 3.3 g/dL — AB (ref 3.5–5.0)
ALT: 31 U/L (ref 10–47)
ANION GAP: 3 — AB (ref 5–15)
AST: 26 U/L (ref 11–38)
Alkaline Phosphatase: 118 U/L — ABNORMAL HIGH (ref 26–84)
BILIRUBIN TOTAL: 1 mg/dL (ref 0.2–1.6)
BUN: 10 mg/dL (ref 7–22)
CALCIUM: 9.1 mg/dL (ref 8.0–10.3)
CO2: 28 mmol/L (ref 18–33)
CREATININE: 1.5 mg/dL — AB (ref 0.60–1.20)
Chloride: 107 mmol/L (ref 98–108)
Glucose, Bld: 154 mg/dL — ABNORMAL HIGH (ref 73–118)
Potassium: 3.1 mmol/L — ABNORMAL LOW (ref 3.3–4.7)
Sodium: 138 mmol/L (ref 128–145)
TOTAL PROTEIN: 6.9 g/dL (ref 6.4–8.1)

## 2017-06-07 MED ORDER — ZOLEDRONIC ACID 4 MG/100ML IV SOLN
4.0000 mg | Freq: Once | INTRAVENOUS | Status: AC
Start: 2017-06-07 — End: 2017-06-07
  Administered 2017-06-07: 4 mg via INTRAVENOUS
  Filled 2017-06-07: qty 100

## 2017-06-07 NOTE — Progress Notes (Signed)
Okay to give Zometa 4 mg with SCr = 1.5 today. Patient is likely dehydrated from diarrhea secondary to Miralax per Dr. Marin Olp.

## 2017-06-07 NOTE — Progress Notes (Signed)
Hematology and Oncology Follow Up Visit  [name] [name] [id number] January 08, 1941 77 y.o. 06/07/2017   Principle Diagnosis:  IgG kappa myeloma - Trisomy 11 by FISH Acute thromboembolism of the right lower leg  Current Therapy:   Revlimid 10mg  po q day (21/28) Zometa 4 mg IV every 3 months  - next dose in -08/2017 Xarelto 10 mg by mouth daily  Past Therapy:  Patient s/p cycle 5 of Velcade/Revlimid/Decadron S/P ASCT at [place] on 04/02/2015   Interim History:  [name] [name] is here today for follow-up.  His problems now that he has some diarrhea.  He actually is taking some MiraLAX and Senokot.  He is trying to adjust these.  He says that when he goes into this is one big "blowout" and then everything settles down.  I told him to take some Pepto-Bismol if he is worried about getting constipated with Imodium.  His back is doing a little bit better.  He has had some injections.  He goes back to see if he needs a spinal nerve ablation.  Hopefully he will not.  Always last saw him, there was no monoclonal spike in his blood.  His IgG level was 1339 mg/dL.  His Kappa Lightchain was 3.8 mg/dL.  He has had no fever.  He has had no bleeding.  He has had no cough or shortness of breath.  Overall, I would say that his performance status is ECOG 1.    Medications:  Allergies as of 06/07/2017      Reactions   Codeine Itching   Hydrocodone-acetaminophen Itching   Mushroom Extract Complex Nausea And Vomiting   Only shitake mushroom  causes this reaction.   Symbicort [budesonide-formoterol Fumarate] Itching      Medication List        Accurate as of 06/07/17 12:23 PM. Always use your most recent med list.          albuterol 108 (90 Base) MCG/ACT inhaler Commonly known as:  PROVENTIL HFA;VENTOLIN HFA Inhale 2 puffs into the lungs every 6 (six) hours as needed for wheezing or shortness of breath.   amLODipine 5 MG tablet Commonly known as:  NORVASC TAKE 1 TABLET EVERY DAY   aspirin EC 81  MG tablet Take by mouth.   atorvastatin 40 MG tablet Commonly known as:  LIPITOR Take 1 tablet (40 mg total) by mouth daily.   carvedilol 6.25 MG tablet Commonly known as:  COREG TAKE 1 TABLET TWICE DAILY WITH A MEAL   esomeprazole 40 MG capsule Commonly known as:  NEXIUM Take 1 capsule (40 mg total) by mouth daily at 12 noon.   famciclovir 500 MG tablet Commonly known as:  FAMVIR TAKE 1 TABLET EVERY DAY   gabapentin 600 MG tablet Commonly known as:  NEURONTIN TAKE 1 TABLET EVERY MORNING  AND TAKE 2 TABLETS EVERY NIGHT   hydrochlorothiazide 12.5 MG capsule Commonly known as:  MICROZIDE TAKE 1 CAPSULE EVERY DAY   isosorbide mononitrate 30 MG 24 hr tablet Commonly known as:  IMDUR TAKE 1 TABLET TWICE DAILY   KLOR-CON M20 20 MEQ tablet Generic drug:  potassium chloride SA TAKE 1 TABLET TWICE DAILY   lenalidomide 10 MG capsule Commonly known as:  REVLIMID TAKE 1 CAPSULE BY MOUTH EVERY DAY FOR 21 DAYS ON AND 7 DAYS OFF. MEQA#[id number]   Loratadine 10 MG Caps Take 10 mg by mouth daily as needed (allergies).   LORazepam 1 MG tablet Commonly known as:  ATIVAN TAKE 1 TABLET AT BEDTIME  montelukast 10 MG tablet Commonly known as:  SINGULAIR TAKE 1 TABLET AT BEDTIME   nitroGLYCERIN 0.4 MG SL tablet Commonly known as:  NITROSTAT Place 0.4 mg under the tongue every 5 (five) minutes as needed. For chest pain   oxyCODONE-acetaminophen 10-325 MG tablet Commonly known as:  PERCOCET Take 1 tablet by mouth daily as needed for pain.   Potassium Chloride ER 20 MEQ Tbcr TAKE 1 TABLET TWICE DAILY   pyridoxine 100 MG tablet Commonly known as:  B-6 Take 100 mg by mouth daily.   RA VITAMIN B-12 TR 1000 MCG Tbcr Generic drug:  Cyanocobalamin Take 1 tablet by mouth daily.   XARELTO 20 MG Tabs tablet Generic drug:  rivaroxaban TAKE 1 TABLET EVERY DAY WITH SUPPER       Allergies:  Allergies  Allergen Reactions  . Codeine Itching  . Hydrocodone-Acetaminophen Itching    . Mushroom Extract Complex Nausea And Vomiting    Only shitake mushroom  causes this reaction.  . Symbicort [Budesonide-Formoterol Fumarate] Itching    Past Medical History, Surgical history, Social history, and Family History were reviewed and updated.  Review of Systems: Review of Systems  Respiratory: Positive for cough.   Musculoskeletal: Positive for back pain.  All other systems reviewed and are negative.    Physical Exam:  weight is 203 lb (92.1 kg). His blood pressure is 110/54 (abnormal) and his pulse is 52 (abnormal). His respiration is 17 and oxygen saturation is 97%.   Wt Readings from Last 3 Encounters:  06/07/17 203 lb (92.1 kg)  04/26/17 201 lb 6.4 oz (91.4 kg)  03/14/17 199 lb (90.3 kg)    Physical Exam  Constitutional: He is oriented to person, place, and time.  HENT:  Head: Normocephalic and atraumatic.  Mouth/Throat: Oropharynx is clear and moist.  Eyes: Pupils are equal, round, and reactive to light. EOM are normal.  Neck: Normal range of motion.  Cardiovascular: Normal rate, regular rhythm and normal heart sounds.  Pulmonary/Chest: Effort normal and breath sounds normal.  Abdominal: Soft. Bowel sounds are normal.  Musculoskeletal: Normal range of motion. He exhibits no edema, tenderness or deformity.  Lymphadenopathy:    He has no cervical adenopathy.  Neurological: He is alert and oriented to person, place, and time.  Skin: Skin is warm and dry. No rash noted. No erythema.  Psychiatric: He has a normal mood and affect. His behavior is normal. Judgment and thought content normal.  Vitals reviewed.    Lab Results  Component Value Date   WBC 3.2 (L) 06/07/2017   HGB 12.1 (L) 06/07/2017   HCT 36.6 (L) 06/07/2017   MCV 85.3 06/07/2017   PLT 168 06/07/2017   Lab Results  Component Value Date   FERRITIN 53 02/19/2013   IRON 45 02/19/2013   TIBC 320 02/19/2013   UIBC 275 02/19/2013   IRONPCTSAT 14 (L) 02/19/2013   Lab Results  Component  Value Date   RBC 4.29 06/07/2017   Lab Results  Component Value Date   KPAFRELGTCHN 38.4 (H) 04/26/2017   LAMBDASER 21.0 04/26/2017   KAPLAMBRATIO 1.83 (H) 04/26/2017   Lab Results  Component Value Date   IGGSERUM 1,339 04/26/2017   IGA 138 04/26/2017   IGMSERUM 29 04/26/2017   Lab Results  Component Value Date   TOTALPROTELP 6.4 04/26/2017   ALBUMINELP 3.4 04/26/2017   A1GS 0.2 04/26/2017   A2GS 0.7 04/26/2017   BETS 0.8 04/26/2017   BETA2SER 0.3 02/11/2015   GAMS 1.3 04/26/2017   MSPIKE  Not Observed 04/26/2017   SPEI Comment 03/14/2017     Chemistry      Component Value Date/Time   NA 138 06/07/2017 1142   NA 140 01/27/2017 1044   NA 141 06/05/2015 1036   K 3.1 (L) 06/07/2017 1142   K 3.3 01/27/2017 1044   K 2.9 (LL) 06/05/2015 1036   CL 107 06/07/2017 1142   CL 101 01/27/2017 1044   CO2 28 06/07/2017 1142   CO2 28 01/27/2017 1044   CO2 27 06/05/2015 1036   BUN 10 06/07/2017 1142   BUN 14 01/27/2017 1044   BUN 13.1 06/05/2015 1036   CREATININE 1.50 (H) 06/07/2017 1142   CREATININE 1.2 01/27/2017 1044   CREATININE 1.0 06/05/2015 1036   GLU 152 03/30/2016      Component Value Date/Time   CALCIUM 9.1 06/07/2017 1142   CALCIUM 8.9 01/27/2017 1044   CALCIUM 9.4 06/05/2015 1036   ALKPHOS 118 (H) 06/07/2017 1142   ALKPHOS 126 (H) 01/27/2017 1044   ALKPHOS 94 06/05/2015 1036   AST 26 06/07/2017 1142   AST 22 06/05/2015 1036   ALT 31 06/07/2017 1142   ALT 33 01/27/2017 1044   ALT 18 06/05/2015 1036   BILITOT 1.0 06/07/2017 1142   BILITOT 0.59 06/05/2015 1036      Impression and Plan: Mr. Hollerbach is a very pleasant 77 yo caucasian gentleman with history of IgG kappa Myeloma and ASCT at [facility] in February 2017.   It now has now been 2 years that he had transplant.  So far, he still is in remission.  I will now move him to every 3 months.  I think this is reasonable given that he is 2 years out from transplant.  We will give him the Zometa today.  His  creatinine is up a little bit but I do not think this will be a problem.  I think the creatinine is probably up from a little bit of hypovolemia from diarrhea.  I will plan to see him back in 3 months.   [name], MD 4/24/201912:23 PM

## 2017-06-08 LAB — KAPPA/LAMBDA LIGHT CHAINS
Kappa free light chain: 36.8 mg/L — ABNORMAL HIGH (ref 3.3–19.4)
Kappa, lambda light chain ratio: 1.38 (ref 0.26–1.65)
Lambda free light chains: 26.6 mg/L — ABNORMAL HIGH (ref 5.7–26.3)

## 2017-06-08 LAB — IGG, IGA, IGM
IGA: 157 mg/dL (ref 61–437)
IGG (IMMUNOGLOBIN G), SERUM: 1455 mg/dL (ref 700–1600)
IGM (IMMUNOGLOBULIN M), SRM: 30 mg/dL (ref 15–143)

## 2017-06-09 LAB — PROTEIN ELECTROPHORESIS, SERUM, WITH REFLEX
A/G RATIO SPE: 1.2 (ref 0.7–1.7)
ALBUMIN ELP: 3.6 g/dL (ref 2.9–4.4)
ALPHA-1-GLOBULIN: 0.2 g/dL (ref 0.0–0.4)
Alpha-2-Globulin: 0.7 g/dL (ref 0.4–1.0)
Beta Globulin: 0.8 g/dL (ref 0.7–1.3)
Gamma Globulin: 1.4 g/dL (ref 0.4–1.8)
Globulin, Total: 3 g/dL (ref 2.2–3.9)
TOTAL PROTEIN ELP: 6.6 g/dL (ref 6.0–8.5)

## 2017-06-12 ENCOUNTER — Telehealth: Payer: Self-pay | Admitting: *Deleted

## 2017-06-12 NOTE — Telephone Encounter (Addendum)
Patient is aware of results  ----- Message from [name], MD sent at 06/09/2017  3:40 PM EDT ----- Call - no myeloma in the blood!!  [name]

## 2017-07-05 ENCOUNTER — Other Ambulatory Visit: Payer: Self-pay | Admitting: *Deleted

## 2017-07-05 DIAGNOSIS — C9000 Multiple myeloma not having achieved remission: Secondary | ICD-10-CM

## 2017-07-05 MED ORDER — LENALIDOMIDE 10 MG PO CAPS: ORAL_CAPSULE | ORAL | 0 refills | Status: DC

## 2017-07-08 ENCOUNTER — Other Ambulatory Visit: Payer: Self-pay | Admitting: Hematology & Oncology

## 2017-07-11 DIAGNOSIS — H18413 Arcus senilis, bilateral: Secondary | ICD-10-CM | POA: Diagnosis not present

## 2017-08-03 DIAGNOSIS — K582 Mixed irritable bowel syndrome: Secondary | ICD-10-CM | POA: Diagnosis not present

## 2017-08-03 DIAGNOSIS — K219 Gastro-esophageal reflux disease without esophagitis: Secondary | ICD-10-CM | POA: Diagnosis not present

## 2017-08-10 ENCOUNTER — Other Ambulatory Visit: Payer: Self-pay | Admitting: *Deleted

## 2017-08-10 DIAGNOSIS — C9000 Multiple myeloma not having achieved remission: Secondary | ICD-10-CM

## 2017-08-10 MED ORDER — LENALIDOMIDE 10 MG PO CAPS: ORAL_CAPSULE | ORAL | 0 refills | Status: DC

## 2017-08-12 ENCOUNTER — Other Ambulatory Visit: Payer: Self-pay | Admitting: Hematology & Oncology

## 2017-08-12 DIAGNOSIS — C9000 Multiple myeloma not having achieved remission: Secondary | ICD-10-CM

## 2017-09-04 ENCOUNTER — Other Ambulatory Visit: Payer: Self-pay | Admitting: *Deleted

## 2017-09-04 ENCOUNTER — Other Ambulatory Visit: Payer: Self-pay | Admitting: Cardiology

## 2017-09-04 ENCOUNTER — Other Ambulatory Visit: Payer: Self-pay | Admitting: Sports Medicine

## 2017-09-04 ENCOUNTER — Other Ambulatory Visit: Payer: Self-pay | Admitting: Hematology & Oncology

## 2017-09-04 DIAGNOSIS — C9000 Multiple myeloma not having achieved remission: Secondary | ICD-10-CM

## 2017-09-04 DIAGNOSIS — E7800 Pure hypercholesterolemia, unspecified: Secondary | ICD-10-CM

## 2017-09-04 MED ORDER — LENALIDOMIDE 10 MG PO CAPS: ORAL_CAPSULE | ORAL | 0 refills | Status: DC

## 2017-09-06 ENCOUNTER — Ambulatory Visit: Payer: Medicare Other | Admitting: Hematology & Oncology

## 2017-09-06 ENCOUNTER — Other Ambulatory Visit: Payer: Medicare Other

## 2017-09-06 ENCOUNTER — Ambulatory Visit: Payer: Medicare Other

## 2017-09-15 ENCOUNTER — Inpatient Hospital Stay (HOSPITAL_BASED_OUTPATIENT_CLINIC_OR_DEPARTMENT_OTHER): Payer: Medicare Other | Admitting: Hematology & Oncology

## 2017-09-15 ENCOUNTER — Other Ambulatory Visit: Payer: Self-pay | Admitting: *Deleted

## 2017-09-15 ENCOUNTER — Other Ambulatory Visit: Payer: Self-pay

## 2017-09-15 ENCOUNTER — Inpatient Hospital Stay: Payer: Medicare Other

## 2017-09-15 ENCOUNTER — Inpatient Hospital Stay: Payer: Medicare Other | Attending: Hematology & Oncology

## 2017-09-15 VITALS — BP 131/70 | HR 47 | Temp 97.8°F | Resp 18 | Wt 216.0 lb

## 2017-09-15 DIAGNOSIS — Z7982 Long term (current) use of aspirin: Secondary | ICD-10-CM | POA: Diagnosis not present

## 2017-09-15 DIAGNOSIS — R05 Cough: Secondary | ICD-10-CM | POA: Diagnosis not present

## 2017-09-15 DIAGNOSIS — C9001 Multiple myeloma in remission: Secondary | ICD-10-CM | POA: Diagnosis not present

## 2017-09-15 DIAGNOSIS — Z7901 Long term (current) use of anticoagulants: Secondary | ICD-10-CM

## 2017-09-15 DIAGNOSIS — Z9221 Personal history of antineoplastic chemotherapy: Secondary | ICD-10-CM | POA: Diagnosis not present

## 2017-09-15 DIAGNOSIS — M899 Disorder of bone, unspecified: Secondary | ICD-10-CM

## 2017-09-15 DIAGNOSIS — Z9484 Stem cells transplant status: Secondary | ICD-10-CM | POA: Insufficient documentation

## 2017-09-15 DIAGNOSIS — Z86718 Personal history of other venous thrombosis and embolism: Secondary | ICD-10-CM | POA: Diagnosis not present

## 2017-09-15 DIAGNOSIS — R7989 Other specified abnormal findings of blood chemistry: Secondary | ICD-10-CM | POA: Diagnosis not present

## 2017-09-15 DIAGNOSIS — R42 Dizziness and giddiness: Secondary | ICD-10-CM | POA: Insufficient documentation

## 2017-09-15 DIAGNOSIS — C9000 Multiple myeloma not having achieved remission: Secondary | ICD-10-CM

## 2017-09-15 LAB — CBC WITH DIFFERENTIAL (CANCER CENTER ONLY)
BASOS ABS: 0.1 10*3/uL (ref 0.0–0.1)
BASOS PCT: 2 %
Eosinophils Absolute: 0.1 10*3/uL (ref 0.0–0.5)
Eosinophils Relative: 3 %
HEMATOCRIT: 36.4 % — AB (ref 38.7–49.9)
Hemoglobin: 11.8 g/dL — ABNORMAL LOW (ref 13.0–17.1)
LYMPHS PCT: 36 %
Lymphs Abs: 1.6 10*3/uL (ref 0.9–3.3)
MCH: 29.2 pg (ref 28.0–33.4)
MCHC: 32.4 g/dL (ref 32.0–35.9)
MCV: 90.1 fL (ref 82.0–98.0)
MONO ABS: 0.4 10*3/uL (ref 0.1–0.9)
Monocytes Relative: 9 %
NEUTROS ABS: 2.2 10*3/uL (ref 1.5–6.5)
NEUTROS PCT: 50 %
Platelet Count: 139 10*3/uL — ABNORMAL LOW (ref 145–400)
RBC: 4.04 MIL/uL — AB (ref 4.20–5.70)
RDW: 17.5 % — AB (ref 11.1–15.7)
WBC: 4.5 10*3/uL (ref 4.0–10.0)

## 2017-09-15 LAB — CMP (CANCER CENTER ONLY)
ALT: 51 U/L — AB (ref 10–47)
ANION GAP: 5 (ref 5–15)
AST: 60 U/L — AB (ref 11–38)
Albumin: 3.4 g/dL — ABNORMAL LOW (ref 3.5–5.0)
Alkaline Phosphatase: 111 U/L — ABNORMAL HIGH (ref 26–84)
BILIRUBIN TOTAL: 1 mg/dL (ref 0.2–1.6)
BUN: 13 mg/dL (ref 7–22)
CALCIUM: 9.1 mg/dL (ref 8.0–10.3)
CO2: 34 mmol/L — AB (ref 18–33)
CREATININE: 1.4 mg/dL — AB (ref 0.60–1.20)
Chloride: 100 mmol/L (ref 98–108)
GLUCOSE: 105 mg/dL (ref 73–118)
Potassium: 3.5 mmol/L (ref 3.3–4.7)
Sodium: 139 mmol/L (ref 128–145)
Total Protein: 7 g/dL (ref 6.4–8.1)

## 2017-09-15 LAB — LACTATE DEHYDROGENASE: LDH: 225 U/L — ABNORMAL HIGH (ref 98–192)

## 2017-09-15 MED ORDER — PREDNISONE 20 MG PO TABS: ORAL_TABLET | ORAL | 0 refills | Status: DC

## 2017-09-15 MED ORDER — ZOLEDRONIC ACID 4 MG/100ML IV SOLN
4.0000 mg | Freq: Once | INTRAVENOUS | Status: AC
  Administered 2017-09-15: 4 mg via INTRAVENOUS
  Filled 2017-09-15: qty 100

## 2017-09-15 MED ORDER — SODIUM CHLORIDE 0.9 % IV SOLN
Freq: Once | INTRAVENOUS | Status: AC
  Administered 2017-09-15: 15:00:00 via INTRAVENOUS
  Filled 2017-09-15: qty 250

## 2017-09-15 MED ORDER — CEFDINIR 300 MG PO CAPS: 300.0000 mg | ORAL_CAPSULE | Freq: Two times a day (BID) | ORAL | 0 refills | Status: DC

## 2017-09-15 MED ORDER — PREDNISONE 20 MG PO TABS: 20.0000 mg | ORAL_TABLET | Freq: Every day | ORAL | 0 refills | Status: DC

## 2017-09-15 NOTE — Progress Notes (Signed)
Hematology and Oncology Follow Up Visit  [name] [name] [id number] March 29, 1940 77 y.o. 09/15/2017   Principle Diagnosis:  IgG kappa myeloma - Trisomy 11 by FISH Acute thromboembolism of the right lower leg  Current Therapy:   Revlimid 10mg  po q day (21/28) -- d/c on 09/15/2017 Zometa 4 mg IV every 3 months  - next dose in -08/2017 Xarelto 10 mg by mouth daily  Past Therapy:  Patient s/p cycle 5 of Velcade/Revlimid/Decadron S/P ASCT at [place] [facility] on 04/02/2015   Interim History:  [name] [name] is here today for follow-up.  He continues have quite a few issues.  He says his back is getting worse.  He has seen a spine doctor.  He had some epidural injections.  Does not sound like this helped all that much.  He has had x-rays.  He had an MRI back in December 2018.  There is no evidence of myeloma.  He had multilevel spondylosis.  I recommended chiropractor for him.  He is also thinking about acupuncture.  He also complains of having a cough.  He says he has green sputum when he wakes up in the morning.  He feels somewhat tight in the chest when he lies down.  We will see about a CT of the chest.  He has been on Revlimid now for over 2 years.  I really think that some of his issues might be from Revlimid.  I think that we should probably hold his Revlimid now.  I do not think he has bad disease that requires lifelong maintenance therapy.  He has not had a measurable monoclonal spike in his blood for a year or so.  We saw him back in April, there is no monoclonal spike in his blood.  His IgG G level was 1455 mg/dL.  His Kappa Light chain was 3.7 mg/dL.  He is happy to get off of the Revlimid.    Overall, I would say that his performance status is ECOG 1.    Medications:  Allergies as of 09/15/2017      Reactions   Codeine Itching   Hydrocodone-acetaminophen Itching   Mushroom Extract Complex Nausea And Vomiting   Only shitake mushroom  causes this reaction.   Symbicort  [budesonide-formoterol Fumarate] Itching      Medication List        Accurate as of 09/15/17  3:51 PM. Always use your most recent med list.          albuterol 108 (90 Base) MCG/ACT inhaler Commonly known as:  PROVENTIL HFA;VENTOLIN HFA Inhale 2 puffs into the lungs every 6 (six) hours as needed for wheezing or shortness of breath.   amLODipine 5 MG tablet Commonly known as:  NORVASC TAKE 1 TABLET EVERY DAY   aspirin EC 81 MG tablet Take by mouth.   atorvastatin 40 MG tablet Commonly known as:  LIPITOR TAKE 1 TABLET EVERY DAY   carvedilol 6.25 MG tablet Commonly known as:  COREG TAKE 1 TABLET TWICE DAILY WITH A MEAL   cefdinir 300 MG capsule Commonly known as:  OMNICEF Take 1 capsule (300 mg total) by mouth 2 (two) times daily.   esomeprazole 40 MG capsule Commonly known as:  NEXIUM Take 1 capsule (40 mg total) by mouth daily at 12 noon.   famciclovir 500 MG tablet Commonly known as:  FAMVIR TAKE 1 TABLET EVERY DAY   gabapentin 600 MG tablet Commonly known as:  NEURONTIN TAKE 1 TABLET EVERY MORNING  AND TAKE 2 TABLETS EVERY NIGHT  hydrochlorothiazide 12.5 MG capsule Commonly known as:  MICROZIDE TAKE 1 CAPSULE EVERY DAY   isosorbide mononitrate 30 MG 24 hr tablet Commonly known as:  IMDUR TAKE 1 TABLET TWICE DAILY   KLOR-CON M20 20 MEQ tablet Generic drug:  potassium chloride SA TAKE 1 TABLET TWICE DAILY   lenalidomide 10 MG capsule Commonly known as:  REVLIMID TAKE 1 CAPSULE BY MOUTH EVERY DAY FOR 21 DAYS ON AND 7 DAYS OFF.   Loratadine 10 MG Caps Take 10 mg by mouth daily as needed (allergies).   LORazepam 1 MG tablet Commonly known as:  ATIVAN TAKE 1 TABLET AT BEDTIME   montelukast 10 MG tablet Commonly known as:  SINGULAIR TAKE 1 TABLET AT BEDTIME   nitroGLYCERIN 0.4 MG SL tablet Commonly known as:  NITROSTAT Place 0.4 mg under the tongue every 5 (five) minutes as needed. For chest pain   oxyCODONE-acetaminophen 10-325 MG  tablet Commonly known as:  PERCOCET Take 1 tablet by mouth daily as needed for pain.   Potassium Chloride ER 20 MEQ Tbcr TAKE 1 TABLET TWICE DAILY   predniSONE 20 MG tablet Commonly known as:  DELTASONE Take daily for 4 days then take 1/2 pill daily for 10 days, then 1/4 pill daily for 10 days   pyridoxine 100 MG tablet Commonly known as:  B-6 Take 100 mg by mouth daily.   RA VITAMIN B-12 TR 1000 MCG Tbcr Generic drug:  Cyanocobalamin Take 1 tablet by mouth daily.   XARELTO 20 MG Tabs tablet Generic drug:  rivaroxaban TAKE 1 TABLET EVERY DAY WITH SUPPER       Allergies:  Allergies  Allergen Reactions  . Codeine Itching  . Hydrocodone-Acetaminophen Itching  . Mushroom Extract Complex Nausea And Vomiting    Only shitake mushroom  causes this reaction.  . Symbicort [Budesonide-Formoterol Fumarate] Itching    Past Medical History, Surgical history, Social history, and Family History were reviewed and updated.  Review of Systems: Review of Systems  Respiratory: Positive for cough.   Musculoskeletal: Positive for back pain.  All other systems reviewed and are negative.    Physical Exam:  weight is 216 lb (98 kg). His oral temperature is 97.8 F (36.6 C). His blood pressure is 131/70 and his pulse is 47 (abnormal). His respiration is 18 and oxygen saturation is 99%.   Wt Readings from Last 3 Encounters:  09/15/17 216 lb (98 kg)  06/07/17 203 lb (92.1 kg)  04/26/17 201 lb 6.4 oz (91.4 kg)    Physical Exam  Constitutional: He is oriented to person, place, and time.  HENT:  Head: Normocephalic and atraumatic.  Mouth/Throat: Oropharynx is clear and moist.  Eyes: Pupils are equal, round, and reactive to light. EOM are normal.  Neck: Normal range of motion.  Cardiovascular: Normal rate, regular rhythm and normal heart sounds.  Pulmonary/Chest: Effort normal and breath sounds normal.  Abdominal: Soft. Bowel sounds are normal.  Musculoskeletal: Normal range of  motion. He exhibits no edema, tenderness or deformity.  Lymphadenopathy:    He has no cervical adenopathy.  Neurological: He is alert and oriented to person, place, and time.  Skin: Skin is warm and dry. No rash noted. No erythema.  Psychiatric: He has a normal mood and affect. His behavior is normal. Judgment and thought content normal.  Vitals reviewed.    Lab Results  Component Value Date   WBC 4.5 09/15/2017   HGB 11.8 (L) 09/15/2017   HCT 36.4 (L) 09/15/2017   MCV 90.1 09/15/2017  PLT 139 (L) 09/15/2017   Lab Results  Component Value Date   FERRITIN 53 02/19/2013   IRON 45 02/19/2013   TIBC 320 02/19/2013   UIBC 275 02/19/2013   IRONPCTSAT 14 (L) 02/19/2013   Lab Results  Component Value Date   RBC 4.04 (L) 09/15/2017   Lab Results  Component Value Date   KPAFRELGTCHN 36.8 (H) 06/07/2017   LAMBDASER 26.6 (H) 06/07/2017   KAPLAMBRATIO 1.38 06/07/2017   Lab Results  Component Value Date   IGGSERUM 1,455 06/07/2017   IGA 157 06/07/2017   IGMSERUM 30 06/07/2017   Lab Results  Component Value Date   TOTALPROTELP 6.6 06/07/2017   ALBUMINELP 3.6 06/07/2017   A1GS 0.2 06/07/2017   A2GS 0.7 06/07/2017   BETS 0.8 06/07/2017   BETA2SER 0.3 02/11/2015   GAMS 1.4 06/07/2017   MSPIKE Not Observed 06/07/2017   SPEI Comment 03/14/2017     Chemistry      Component Value Date/Time   NA 139 09/15/2017 1303   NA 140 01/27/2017 1044   NA 141 06/05/2015 1036   K 3.5 09/15/2017 1303   K 3.3 01/27/2017 1044   K 2.9 (LL) 06/05/2015 1036   CL 100 09/15/2017 1303   CL 101 01/27/2017 1044   CO2 34 (H) 09/15/2017 1303   CO2 28 01/27/2017 1044   CO2 27 06/05/2015 1036   BUN 13 09/15/2017 1303   BUN 14 01/27/2017 1044   BUN 13.1 06/05/2015 1036   CREATININE 1.40 (H) 09/15/2017 1303   CREATININE 1.2 01/27/2017 1044   CREATININE 1.0 06/05/2015 1036   GLU 152 03/30/2016      Component Value Date/Time   CALCIUM 9.1 09/15/2017 1303   CALCIUM 8.9 01/27/2017 1044    CALCIUM 9.4 06/05/2015 1036   ALKPHOS 111 (H) 09/15/2017 1303   ALKPHOS 126 (H) 01/27/2017 1044   ALKPHOS 94 06/05/2015 1036   AST 60 (H) 09/15/2017 1303   AST 22 06/05/2015 1036   ALT 51 (H) 09/15/2017 1303   ALT 33 01/27/2017 1044   ALT 18 06/05/2015 1036   BILITOT 1.0 09/15/2017 1303   BILITOT 0.59 06/05/2015 1036      Impression and Plan: [name] [name] is a very pleasant 77 yo caucasian gentleman with history of IgG kappa Myeloma and ASCT at [facility] in February 2017.   Again, I think that it would be reasonable to get him off of Revlimid.  Hopefully, he will feel better.  I am not sure why his liver function tests are elevated.  If the not elevated by much however.  I think we had to see him back in 4 weeks.  A lot is going on with him.  I just want to make sure that we stay on top of this.  [name], MD 8/2/20193:51 PM

## 2017-09-16 LAB — IGG, IGA, IGM
IGG (IMMUNOGLOBIN G), SERUM: 1288 mg/dL (ref 700–1600)
IgA: 157 mg/dL (ref 61–437)
IgM (Immunoglobulin M), Srm: 32 mg/dL (ref 15–143)

## 2017-09-18 ENCOUNTER — Ambulatory Visit (INDEPENDENT_AMBULATORY_CARE_PROVIDER_SITE_OTHER): Payer: Medicare Other

## 2017-09-18 DIAGNOSIS — I700 Atherosclerosis of aorta: Secondary | ICD-10-CM | POA: Diagnosis not present

## 2017-09-18 DIAGNOSIS — R911 Solitary pulmonary nodule: Secondary | ICD-10-CM

## 2017-09-18 DIAGNOSIS — C9001 Multiple myeloma in remission: Secondary | ICD-10-CM

## 2017-09-18 DIAGNOSIS — J90 Pleural effusion, not elsewhere classified: Secondary | ICD-10-CM | POA: Diagnosis not present

## 2017-09-18 LAB — PROTEIN ELECTROPHORESIS, SERUM, WITH REFLEX
A/G Ratio: 1.1 (ref 0.7–1.7)
Albumin ELP: 3.5 g/dL (ref 2.9–4.4)
Alpha-1-Globulin: 0.2 g/dL (ref 0.0–0.4)
Alpha-2-Globulin: 0.7 g/dL (ref 0.4–1.0)
Beta Globulin: 1 g/dL (ref 0.7–1.3)
GLOBULIN, TOTAL: 3.2 g/dL (ref 2.2–3.9)
Gamma Globulin: 1.3 g/dL (ref 0.4–1.8)
TOTAL PROTEIN ELP: 6.7 g/dL (ref 6.0–8.5)

## 2017-09-18 LAB — KAPPA/LAMBDA LIGHT CHAINS
Kappa free light chain: 42.7 mg/L — ABNORMAL HIGH (ref 3.3–19.4)
Kappa, lambda light chain ratio: 1.67 — ABNORMAL HIGH (ref 0.26–1.65)
Lambda free light chains: 25.6 mg/L (ref 5.7–26.3)

## 2017-09-19 ENCOUNTER — Other Ambulatory Visit: Payer: Self-pay | Admitting: *Deleted

## 2017-09-19 ENCOUNTER — Telehealth: Payer: Self-pay | Admitting: *Deleted

## 2017-09-19 ENCOUNTER — Telehealth: Payer: Self-pay | Admitting: Emergency Medicine

## 2017-09-19 DIAGNOSIS — J449 Chronic obstructive pulmonary disease, unspecified: Secondary | ICD-10-CM

## 2017-09-19 DIAGNOSIS — C9001 Multiple myeloma in remission: Secondary | ICD-10-CM

## 2017-09-19 NOTE — Telephone Encounter (Signed)
OK with me.

## 2017-09-19 NOTE — Progress Notes (Signed)
refer

## 2017-09-19 NOTE — Telephone Encounter (Signed)
RB please advise if you're ok with taking on patient.  Thanks!

## 2017-09-19 NOTE — Telephone Encounter (Signed)
Left message for patient to call back  

## 2017-09-19 NOTE — Telephone Encounter (Signed)
Spoke with patient-aware of office protocol for switching providers. [name] [name] please advise if you are okay with patient changing to [name]? Then send message to [facility] to advise. Thanks.

## 2017-09-19 NOTE — Telephone Encounter (Addendum)
Patient is aware of results.   ----- Message from [name] Napoleon, MD sent at 09/18/2017  5:13 PM EDT ----- Call -- the CT scan dose not show any pneumonia. There is a l;ittle fluid around the left lung and heart. He probably needs to see a pulmonologist for his lungs.  Please refer to Dr. Lamonte Sakai.  Send this to is family MD.  [name]

## 2017-09-21 ENCOUNTER — Other Ambulatory Visit: Payer: Self-pay | Admitting: *Deleted

## 2017-09-21 MED ORDER — CEFDINIR 300 MG PO CAPS: 300.0000 mg | ORAL_CAPSULE | Freq: Two times a day (BID) | ORAL | 0 refills | Status: DC

## 2017-09-21 NOTE — Telephone Encounter (Signed)
Called and spoke with pt regarding provider switch Scheduled appt with RB on 10/04/17 at 9:15pm for 84min ov Pt verbalized understanding, nothing further needed.

## 2017-09-22 DIAGNOSIS — M9903 Segmental and somatic dysfunction of lumbar region: Secondary | ICD-10-CM | POA: Diagnosis not present

## 2017-09-22 DIAGNOSIS — M9905 Segmental and somatic dysfunction of pelvic region: Secondary | ICD-10-CM | POA: Diagnosis not present

## 2017-09-22 DIAGNOSIS — M5431 Sciatica, right side: Secondary | ICD-10-CM | POA: Diagnosis not present

## 2017-09-22 DIAGNOSIS — M9901 Segmental and somatic dysfunction of cervical region: Secondary | ICD-10-CM | POA: Diagnosis not present

## 2017-09-25 DIAGNOSIS — M9905 Segmental and somatic dysfunction of pelvic region: Secondary | ICD-10-CM | POA: Diagnosis not present

## 2017-09-25 DIAGNOSIS — M9903 Segmental and somatic dysfunction of lumbar region: Secondary | ICD-10-CM | POA: Diagnosis not present

## 2017-09-25 DIAGNOSIS — M9901 Segmental and somatic dysfunction of cervical region: Secondary | ICD-10-CM | POA: Diagnosis not present

## 2017-09-25 DIAGNOSIS — M5431 Sciatica, right side: Secondary | ICD-10-CM | POA: Diagnosis not present

## 2017-09-27 DIAGNOSIS — M9905 Segmental and somatic dysfunction of pelvic region: Secondary | ICD-10-CM | POA: Diagnosis not present

## 2017-09-27 DIAGNOSIS — M9901 Segmental and somatic dysfunction of cervical region: Secondary | ICD-10-CM | POA: Diagnosis not present

## 2017-09-27 DIAGNOSIS — M9903 Segmental and somatic dysfunction of lumbar region: Secondary | ICD-10-CM | POA: Diagnosis not present

## 2017-09-27 DIAGNOSIS — M5431 Sciatica, right side: Secondary | ICD-10-CM | POA: Diagnosis not present

## 2017-09-29 DIAGNOSIS — M9905 Segmental and somatic dysfunction of pelvic region: Secondary | ICD-10-CM | POA: Diagnosis not present

## 2017-09-29 DIAGNOSIS — M9903 Segmental and somatic dysfunction of lumbar region: Secondary | ICD-10-CM | POA: Diagnosis not present

## 2017-09-29 DIAGNOSIS — M5431 Sciatica, right side: Secondary | ICD-10-CM | POA: Diagnosis not present

## 2017-09-29 DIAGNOSIS — M9901 Segmental and somatic dysfunction of cervical region: Secondary | ICD-10-CM | POA: Diagnosis not present

## 2017-10-02 DIAGNOSIS — M9905 Segmental and somatic dysfunction of pelvic region: Secondary | ICD-10-CM | POA: Diagnosis not present

## 2017-10-02 DIAGNOSIS — M5431 Sciatica, right side: Secondary | ICD-10-CM | POA: Diagnosis not present

## 2017-10-02 DIAGNOSIS — M9901 Segmental and somatic dysfunction of cervical region: Secondary | ICD-10-CM | POA: Diagnosis not present

## 2017-10-02 DIAGNOSIS — M9903 Segmental and somatic dysfunction of lumbar region: Secondary | ICD-10-CM | POA: Diagnosis not present

## 2017-10-04 DIAGNOSIS — M5431 Sciatica, right side: Secondary | ICD-10-CM | POA: Diagnosis not present

## 2017-10-04 DIAGNOSIS — M9901 Segmental and somatic dysfunction of cervical region: Secondary | ICD-10-CM | POA: Diagnosis not present

## 2017-10-04 DIAGNOSIS — M9903 Segmental and somatic dysfunction of lumbar region: Secondary | ICD-10-CM | POA: Diagnosis not present

## 2017-10-04 DIAGNOSIS — M9905 Segmental and somatic dysfunction of pelvic region: Secondary | ICD-10-CM | POA: Diagnosis not present

## 2017-10-05 ENCOUNTER — Encounter: Payer: Self-pay | Admitting: Emergency Medicine

## 2017-10-05 ENCOUNTER — Ambulatory Visit (INDEPENDENT_AMBULATORY_CARE_PROVIDER_SITE_OTHER): Payer: Medicare Other | Admitting: Emergency Medicine

## 2017-10-05 DIAGNOSIS — I2699 Other pulmonary embolism without acute cor pulmonale: Secondary | ICD-10-CM | POA: Diagnosis not present

## 2017-10-05 DIAGNOSIS — J479 Bronchiectasis, uncomplicated: Secondary | ICD-10-CM | POA: Diagnosis not present

## 2017-10-05 DIAGNOSIS — R911 Solitary pulmonary nodule: Secondary | ICD-10-CM | POA: Diagnosis not present

## 2017-10-05 DIAGNOSIS — K219 Gastro-esophageal reflux disease without esophagitis: Secondary | ICD-10-CM

## 2017-10-05 DIAGNOSIS — J432 Centrilobular emphysema: Secondary | ICD-10-CM

## 2017-10-05 DIAGNOSIS — G4733 Obstructive sleep apnea (adult) (pediatric): Secondary | ICD-10-CM

## 2017-10-05 MED ORDER — FLUTICASONE PROPIONATE 50 MCG/ACT NA SUSP: 2.0000 | Freq: Every day | NASAL | 1 refills | Status: DC

## 2017-10-05 MED ORDER — CETIRIZINE HCL 10 MG PO TABS: 10.0000 mg | ORAL_TABLET | Freq: Every day | ORAL | 1 refills | Status: DC

## 2017-10-05 NOTE — Assessment & Plan Note (Signed)
Well-controlled on his current pantoprazole

## 2017-10-05 NOTE — Assessment & Plan Note (Signed)
With recurrent DVT, on lifelong anticoagulation

## 2017-10-05 NOTE — Progress Notes (Signed)
Subjective:    Patient ID: [name] [name], male    DOB: 08/25/1940, 77 y.o.   MRN: [id number]  HPI 77 year old gentleman, former smoker (pack years) with a history of multiple myeloma and bone marrow transplant at [facility] 03/2015, pulmonary embolism and DVT on lifelong anticoagulation, obstructive sleep apnea on CPAP.  He has been followed for COPD, no spirometry available in our system.  Also with pulmonary nodules followed by CT chest.  Most recent was 09/18/2017 which I have reviewed and which shows some right middle lobe and lingular, left lower lobe bronchiectatic change, stable LLL scar / inflammation, small left pleural effusion and a stable left lower lobe 6 mm nodule.   He was treated with Revlimid x 2 years, just stopped it. Is on famvir.   He has been producing more colored mucous for about 3 months, was treated with abx x 2 by Dr Katheran Awe and prompted the CT chest as above. The mucous thinned out some w abx, then he caught URI about 2 weeks ago and has had some hoarse voice, increased mucous with color.  He has chronic sinus drainage, GERD. Has breakthrough PND, no breakthrough GERD.   Currently on Singulair, has albuterol to use as needed. CPAP compliance is excellent, he has significant clinical benefit. More energy, less daytime SOB.   Needs repeat CT chest 1 year (at least) for nodule, maybe sooner for BTX.    Review of Systems  Past Medical History:  Diagnosis Date  . Acute deep vein thrombosis (DVT) of tibial vein of right lower extremity ([place]) 09/29/2014  . Arthritis    back  . Asthma    uses Advair bid and asmanex prn  . Cancer ([place])    multiple myeloma  . Chronic back pain    buldging disc and scoliosis  . Chronic bronchitis ([place])    couple of yrs ago  . Complication of anesthesia    severe case of hiccups after hernia surgery;required medication  . Diverticulosis   . Dizziness    related to neck issues  . Emphysema   . Enlarged prostate    takes Flomax daily    . GERD (gastroesophageal reflux disease)    takes PRotonix daily  . History of colon polyps   . Hyperlipidemia    takes Zocor daily  . Hypertension    takes HCTZ,Diovan daily and Isosorbide daily  . Itching    down left arm and leg  . Joint pain   . Macular degeneration    mild,beginning  . Multiple myeloma ([place]) 08/13/2014  . Myocardial infarction ([place]) 2010  . Neck pain   . Pain in limb 06/20/2012  . Pneumonia    hx of---12+yrs ago     Family History  Problem Relation Age of Onset  . Hypertension Mother   . Heart attack Father   . Cancer Sister   . Cancer Sister      Social History   Socioeconomic History  . Marital status: Married    Spouse name: Not on file  . Number of children: 3  . Years of education: Not on file  . Highest education level: Not on file  Occupational History  . Not on file  Social Needs  . Financial resource strain: Not on file  . Food insecurity:    Worry: Not on file    Inability: Not on file  . Transportation needs:    Medical: Not on file    Non-medical: Not on file  Tobacco Use  .  Smoking status: Former Smoker    Years: 30.00    Types: Pipe    Last attempt to quit: 11/24/2011    Years since quitting: 5.8  . Smokeless tobacco: Former Systems developer    Quit date: 02/15/2011  . Tobacco comment: quit tobacco 3 years ago  Substance and Sexual Activity  . Alcohol use: Yes    Alcohol/week: 0.0 standard drinks    Comment: 1-2 glasses of wine daily  . Drug use: No  . Sexual activity: Yes  Lifestyle  . Physical activity:    Days per week: Not on file    Minutes per session: Not on file  . Stress: Not on file  Relationships  . Social connections:    Talks on phone: Not on file    Gets together: Not on file    Attends religious service: Not on file    Active member of club or organization: Not on file    Attends meetings of clubs or organizations: Not on file    Relationship status: Not on file  . Intimate partner violence:    Fear of  current or ex partner: Not on file    Emotionally abused: Not on file    Physically abused: Not on file    Forced sexual activity: Not on file  Other Topics Concern  . Not on file  Social History Narrative  . Not on file  has worked as Glass blower/designer, lots of outdoor hobbies and farming Was in [employer], went to [place], [employer]. ? Agent orange exposure Has lived in [place] Exposed to paint fumes.   Allergies  Allergen Reactions  . Codeine Itching  . Hydrocodone-Acetaminophen Itching  . Mushroom Extract Complex Nausea And Vomiting    Only shitake mushroom  causes this reaction.  . Symbicort [Budesonide-Formoterol Fumarate] Itching     Outpatient Medications Prior to Visit  Medication Sig Dispense Refill  . albuterol (PROVENTIL HFA;VENTOLIN HFA) 108 (90 Base) MCG/ACT inhaler Inhale 2 puffs into the lungs every 6 (six) hours as needed for wheezing or shortness of breath. 1 Inhaler 3  . amLODipine (NORVASC) 5 MG tablet TAKE 1 TABLET EVERY DAY 90 tablet 3  . aspirin EC 81 MG tablet Take by mouth.    Marland Kitchen atorvastatin (LIPITOR) 40 MG tablet TAKE 1 TABLET EVERY DAY 90 tablet 0  . carvedilol (COREG) 6.25 MG tablet TAKE 1 TABLET TWICE DAILY WITH A MEAL 180 tablet 3  . Cyanocobalamin (RA VITAMIN B-12 TR) 1000 MCG TBCR Take 1 tablet by mouth daily.     Marland Kitchen esomeprazole (NEXIUM) 40 MG capsule Take 1 capsule (40 mg total) by mouth daily at 12 noon. 90 capsule 2  . famciclovir (FAMVIR) 500 MG tablet TAKE 1 TABLET EVERY DAY 90 tablet 3  . gabapentin (NEURONTIN) 600 MG tablet TAKE 1 TABLET EVERY MORNING  AND TAKE 2 TABLETS EVERY NIGHT 270 tablet 11  . hydrochlorothiazide (MICROZIDE) 12.5 MG capsule TAKE 1 CAPSULE EVERY DAY 90 capsule 0  . isosorbide mononitrate (IMDUR) 30 MG 24 hr tablet TAKE 1 TABLET TWICE DAILY 180 tablet 3  . KLOR-CON M20 20 MEQ tablet TAKE 1 TABLET TWICE DAILY 180 tablet 0  . lenalidomide (REVLIMID) 10 MG capsule TAKE 1 CAPSULE BY MOUTH EVERY DAY FOR 21 DAYS ON AND 7 DAYS OFF. 21  capsule 0  . Loratadine 10 MG CAPS Take 10 mg by mouth daily as needed (allergies).     . LORazepam (ATIVAN) 1 MG tablet TAKE 1 TABLET AT BEDTIME 90  tablet 1  . montelukast (SINGULAIR) 10 MG tablet TAKE 1 TABLET AT BEDTIME 90 tablet 3  . nitroGLYCERIN (NITROSTAT) 0.4 MG SL tablet Place 0.4 mg under the tongue every 5 (five) minutes as needed. For chest pain    . oxyCODONE-acetaminophen (PERCOCET) 10-325 MG tablet Take 1 tablet by mouth daily as needed for pain. 30 tablet 0  . Potassium Chloride ER 20 MEQ TBCR TAKE 1 TABLET TWICE DAILY 180 tablet 0  . pyridoxine (B-6) 100 MG tablet Take 100 mg by mouth daily.     Alveda Reasons 20 MG TABS tablet TAKE 1 TABLET EVERY DAY WITH SUPPER 90 tablet 2  . cefdinir (OMNICEF) 300 MG capsule Take 1 capsule (300 mg total) by mouth 2 (two) times daily. 14 capsule 0  . predniSONE (DELTASONE) 20 MG tablet Take daily for 4 days then take 1/2 pill daily for 10 days, then 1/4 pill daily for 10 days 12 tablet 0   No facility-administered medications prior to visit.          Objective:   Physical Exam  Vitals:   10/05/17 0928  BP: 102/64  Pulse: 61  SpO2: 97%  Weight: 215 lb (97.5 kg)  Height: 6' (1.829 m)   Gen: Pleasant, well-nourished, in no distress,  normal affect  ENT: No lesions,  mouth clear,  oropharynx clear, no postnasal drip  Neck: No JVD, no stridor  Lungs: No use of accessory muscles, coarse at L base, otherwise clear  Cardiovascular: RRR, heart sounds normal, no murmur or gallops, no peripheral edema  Musculoskeletal: No deformities, no cyanosis or clubbing  Neuro: alert, non focal  Skin: Warm, no lesions or rash     Assessment & Plan:  Pulmonary embolism (HCC) With recurrent DVT, on lifelong anticoagulation  Obstructive sleep apnea Great compliance.  Good clinical benefit.  We will do a formal download at his next office visit.  Pulmonary emphysema (HCC) No spirometry available.  We will likely perform pulmonary function  testing in the future to clarify his obstruction, plan appropriate bronchodilator therapy  GERD (gastroesophageal reflux disease) Well-controlled on his current pantoprazole  Bronchiectasis without complication (HCC) Chronic cough with mild bronchiectatic change noted on his CT chest, left lower lobe scarring.  Unclear as to whether this is an active part of his current symptoms although it appears that he did have a true bronchiectatic flare earlier in the year that was treated with antibiotics by Dr. Marin Olp.  If he continues to have symptoms after we try to treat upper airway irritants more aggressively than I think he needs bronchoscopy for culture data.  Solitary pulmonary nodule Right upper lobe nodule resolved, stable 6 mm left lower lobe nodule that can likely be characterized as benign.  We will base the timing of repeat imaging on his symptoms and bronchiectasis as opposed to scheduled surveillance of his nodule  Baltazar Apo, MD, PhD 10/05/2017, 10:13 AM [facility] [phone] or if no answer [phone]

## 2017-10-05 NOTE — Assessment & Plan Note (Signed)
Chronic cough with mild bronchiectatic change noted on his CT chest, left lower lobe scarring.  Unclear as to whether this is an active part of his current symptoms although it appears that he did have a true bronchiectatic flare earlier in the year that was treated with antibiotics by Dr. Marin Olp.  If he continues to have symptoms after we try to treat upper airway irritants more aggressively than I think he needs bronchoscopy for culture data.

## 2017-10-05 NOTE — Assessment & Plan Note (Signed)
Great compliance.  Good clinical benefit.  We will do a formal download at his next office visit.

## 2017-10-05 NOTE — Assessment & Plan Note (Signed)
No spirometry available.  We will likely perform pulmonary function testing in the future to clarify his obstruction, plan appropriate bronchodilator therapy

## 2017-10-05 NOTE — Patient Instructions (Addendum)
Please continue your pantoprazole as you have been taking it.  Please continue Singulair once each evening. Please start Zyrtec 10mg  daily. Start fluticasone nasal spray, 2 sprays each nostril daily.  Depending on your improvement and symptoms we may decide to perform a bronchoscopy to obtain culture information.  Follow with Dr Lamonte Sakai in 2 months or sooner if you have any problems.

## 2017-10-05 NOTE — Assessment & Plan Note (Addendum)
Right upper lobe nodule resolved, stable 6 mm left lower lobe nodule that can likely be characterized as benign.  We will base the timing of repeat imaging on his symptoms and bronchiectasis as opposed to scheduled surveillance of his nodule

## 2017-10-06 ENCOUNTER — Encounter: Payer: Self-pay | Admitting: Sports Medicine

## 2017-10-06 ENCOUNTER — Ambulatory Visit (INDEPENDENT_AMBULATORY_CARE_PROVIDER_SITE_OTHER): Payer: Medicare Other | Admitting: Sports Medicine

## 2017-10-06 DIAGNOSIS — J449 Chronic obstructive pulmonary disease, unspecified: Secondary | ICD-10-CM | POA: Diagnosis not present

## 2017-10-06 DIAGNOSIS — M5431 Sciatica, right side: Secondary | ICD-10-CM | POA: Diagnosis not present

## 2017-10-06 DIAGNOSIS — M9905 Segmental and somatic dysfunction of pelvic region: Secondary | ICD-10-CM | POA: Diagnosis not present

## 2017-10-06 DIAGNOSIS — R4781 Slurred speech: Secondary | ICD-10-CM | POA: Diagnosis not present

## 2017-10-06 DIAGNOSIS — H6123 Impacted cerumen, bilateral: Secondary | ICD-10-CM

## 2017-10-06 DIAGNOSIS — M9903 Segmental and somatic dysfunction of lumbar region: Secondary | ICD-10-CM | POA: Diagnosis not present

## 2017-10-06 DIAGNOSIS — J4489 Other specified chronic obstructive pulmonary disease: Secondary | ICD-10-CM

## 2017-10-06 DIAGNOSIS — M9901 Segmental and somatic dysfunction of cervical region: Secondary | ICD-10-CM | POA: Diagnosis not present

## 2017-10-06 MED ORDER — AZELASTINE HCL 0.05 % OP SOLN: 2.0000 [drp] | Freq: Two times a day (BID) | OPHTHALMIC | 11 refills | Status: DC

## 2017-10-06 MED ORDER — MOXIFLOXACIN HCL 400 MG PO TABS: 400.0000 mg | ORAL_TABLET | Freq: Every day | ORAL | 0 refills | Status: DC

## 2017-10-06 MED ORDER — PREDNISONE 50 MG PO TABS: 50.0000 mg | ORAL_TABLET | Freq: Every day | ORAL | 0 refills | Status: DC

## 2017-10-06 NOTE — Assessment & Plan Note (Signed)
Intermittent, I will go ahead and do a brain MRI with and without contrast.

## 2017-10-06 NOTE — Assessment & Plan Note (Addendum)
[name] [name] is sick, he does have an upper respiratory infection with some lower respiratory symptoms as well. He has labyrinthine symptoms, we did a bilateral cerumen removal with instrumentation. Bronchiectasis and advanced imaging. Postnasal drip syndrome and allergic conjunctivitis. Continue Flonase, Zyrtec. Considering his debilitation, chronic illnesses, and recent myeloma we are going to treat him aggressively. Avelox x10d, prednisone for 5 days. Optivar for the eyes. CBC, CMP, blood cultures. Return to see me in 2 weeks.

## 2017-10-06 NOTE — Progress Notes (Signed)
Subjective:    CC: Feeling sick  HPI: This is a pleasant 77 year old male, history of multiple myeloma post bone marrow transplant, bronchiectasis, more recently he has had upper respiratory symptoms, runny nose, itchy watery eyes, and now sensation of dizziness and being off balance.  Mild cough, minimally productive.  He was seen by his oncologist and prescribed antibiotics, minimal improvement.  Subsequently he was seen by his pulmonologist and given some steroids, these help symptoms to some degree.  No headaches, visual changes, chest pain.  His wife has noted some increased slurred speech intermittently lately.  Overall symptoms have been going on for over a month.  I reviewed the past medical history, family history, social history, surgical history, and allergies today and no changes were needed.  Please see the problem list section below in epic for further details.  Past Medical History: Past Medical History:  Diagnosis Date  . Acute deep vein thrombosis (DVT) of tibial vein of right lower extremity ([place]) 09/29/2014  . Arthritis    back  . Asthma    uses Advair bid and asmanex prn  . Cancer ([place])    multiple myeloma  . Chronic back pain    buldging disc and scoliosis  . Chronic bronchitis ([place])    couple of yrs ago  . Complication of anesthesia    severe case of hiccups after hernia surgery;required medication  . Diverticulosis   . Dizziness    related to neck issues  . Emphysema   . Enlarged prostate    takes Flomax daily  . GERD (gastroesophageal reflux disease)    takes PRotonix daily  . History of colon polyps   . Hyperlipidemia    takes Zocor daily  . Hypertension    takes HCTZ,Diovan daily and Isosorbide daily  . Itching    down left arm and leg  . Joint pain   . Macular degeneration    mild,beginning  . Multiple myeloma ([place]) 08/13/2014  . Myocardial infarction ([place]) 2010  . Neck pain   . Pain in limb 06/20/2012  . Pneumonia    hx of---12+yrs ago    Past Surgical History: Past Surgical History:  Procedure Laterality Date  . ANTERIOR CERVICAL DECOMP/DISCECTOMY FUSION  01/31/2012   Procedure: ANTERIOR CERVICAL DECOMPRESSION/DISCECTOMY FUSION 1 LEVEL;  Surgeon: Floyce Stakes, MD;  Location: [facility];  Service: Neurosurgery;  Laterality: N/A;  Cervical Five-Six  Anterior Cervical Decompression /Diskectomy /Fusion/Plate  . BONE MARROW TRANSPLANT    . CARDIAC CATHETERIZATION  2010/2013   06/07/11 [facility]: Lambert Rhonda W) LAD 25%, D2 75% (small), pLCx 100%, RCA 25%--medical managment   . COLONOSCOPY    . HERNIA REPAIR  2006   x 3   . salivary gland removed     left   . skin cancer removed from left side of face     basal cell carcinoma  . vein removed from left leg     Social History: Social History   Socioeconomic History  . Marital status: Married    Spouse name: Not on file  . Number of children: 3  . Years of education: Not on file  . Highest education level: Not on file  Occupational History  . Not on file  Social Needs  . Financial resource strain: Not on file  . Food insecurity:    Worry: Not on file    Inability: Not on file  . Transportation needs:    Medical: Not on file    Non-medical: Not on file  Tobacco Use  . Smoking status: Former Smoker    Years: 30.00    Types: Pipe    Last attempt to quit: 11/24/2011    Years since quitting: 5.8  . Smokeless tobacco: Former Systems developer    Quit date: 02/15/2011  . Tobacco comment: quit tobacco 3 years ago  Substance and Sexual Activity  . Alcohol use: Yes    Alcohol/week: 0.0 standard drinks    Comment: 1-2 glasses of wine daily  . Drug use: No  . Sexual activity: Yes  Lifestyle  . Physical activity:    Days per week: Not on file    Minutes per session: Not on file  . Stress: Not on file  Relationships  . Social connections:    Talks on phone: Not on file    Gets together: Not on file    Attends religious service: Not on file    Active member of club or organization: Not  on file    Attends meetings of clubs or organizations: Not on file    Relationship status: Not on file  Other Topics Concern  . Not on file  Social History Narrative  . Not on file   Family History: Family History  Problem Relation Age of Onset  . Hypertension Mother   . Heart attack Father   . Cancer Sister   . Cancer Sister    Allergies: Allergies  Allergen Reactions  . Codeine Itching  . Hydrocodone-Acetaminophen Itching  . Mushroom Extract Complex Nausea And Vomiting    Only shitake mushroom  causes this reaction.  . Symbicort [Budesonide-Formoterol Fumarate] Itching   Medications: See med rec.  Review of Systems: No fevers, chills, night sweats, weight loss, chest pain, or shortness of breath.   Objective:    General: Well Developed, well nourished, and in no acute distress.  Neuro: Alert and oriented x3, extra-ocular muscles intact, sensation grossly intact.  Cranial nerves II through XII are intact, motor, sensory, coordinative functions are all intact HEENT: Normocephalic, atraumatic, pupils equal round reactive to light, neck supple, no masses, no lymphadenopathy, thyroid nonpalpable.  Eyes are watery, mild conjunctivitis, bilateral cerumen impactions.  Nasopharynx is erythematous and boggy. Skin: Warm and dry, no rashes. Cardiac: Regular rate and rhythm, no murmurs rubs or gallops, no lower extremity edema.  Respiratory: Clear to auscultation bilaterally. Not using accessory muscles, speaking in full sentences.  Indication: Cerumen impaction of the left and right ear(s) Medical necessity statement: On physical examination, cerumen impairs clinically significant portions of the external auditory canal, and tympanic membrane. Noted obstructive, copious cerumen that cannot be removed without magnification and instrumentations requiring physician skills Consent: Discussed benefits and risks of procedure and verbal consent obtained Procedure: Patient was prepped for the  procedure. Utilized an otoscope to assess and take note of the ear canal, the tympanic membrane, and the presence, amount, and placement of the cerumen. Soft plastic curette was utilized to remove cerumen.  Post procedure examination: shows cerumen was completely removed. Patient tolerated procedure well. The patient is made aware that they may experience temporary vertigo, temporary hearing loss, and temporary discomfort. If these symptom last for more than 24 hours to call the clinic or proceed to the ED.  Impression and Recommendations:    Asthma with COPD Johnnie is sick, he does have an upper respiratory infection with some lower respiratory symptoms as well. He has labyrinthine symptoms, we did a bilateral cerumen removal with instrumentation. Bronchiectasis and advanced imaging. Postnasal drip syndrome and allergic conjunctivitis. Continue  Flonase, Zyrtec. Considering his debilitation, chronic illnesses, and recent myeloma we are going to treat him aggressively. Avelox x10d, prednisone for 5 days. Optivar for the eyes. CBC, CMP, blood cultures. Return to see me in 2 weeks.  Slurring of speech Intermittent, I will go ahead and do a brain MRI with and without contrast.  ___________________________________________ Gwen Her. Dianah Field, M.D., ABFM., CAQSM. Primary Care and [place] Instructor of [employer]

## 2017-10-09 ENCOUNTER — Encounter: Payer: Self-pay | Admitting: Sports Medicine

## 2017-10-09 ENCOUNTER — Telehealth: Payer: Self-pay | Admitting: Sports Medicine

## 2017-10-09 DIAGNOSIS — M9901 Segmental and somatic dysfunction of cervical region: Secondary | ICD-10-CM | POA: Diagnosis not present

## 2017-10-09 DIAGNOSIS — M9905 Segmental and somatic dysfunction of pelvic region: Secondary | ICD-10-CM | POA: Diagnosis not present

## 2017-10-09 DIAGNOSIS — M9903 Segmental and somatic dysfunction of lumbar region: Secondary | ICD-10-CM | POA: Diagnosis not present

## 2017-10-09 DIAGNOSIS — M5431 Sciatica, right side: Secondary | ICD-10-CM | POA: Diagnosis not present

## 2017-10-09 MED ORDER — CIPROFLOXACIN HCL 750 MG PO TABS: 750.0000 mg | ORAL_TABLET | Freq: Two times a day (BID) | ORAL | 0 refills | Status: DC

## 2017-10-09 NOTE — Telephone Encounter (Signed)
Switching to Cipro 

## 2017-10-09 NOTE — Telephone Encounter (Signed)
Unable to call due to phone lines down at this time for whole office. Fax was sent to pharmacy to cancel previous script and fill Cipro.

## 2017-10-09 NOTE — Telephone Encounter (Signed)
Received fax from pharmacy that  Insurance will not cover Moxifloxacin even with a PA and requesting it be changed to Ciprofloxacin. Please send updated prescription to the pharmacy. Please advise.

## 2017-10-11 DIAGNOSIS — M9905 Segmental and somatic dysfunction of pelvic region: Secondary | ICD-10-CM | POA: Diagnosis not present

## 2017-10-11 DIAGNOSIS — M5431 Sciatica, right side: Secondary | ICD-10-CM | POA: Diagnosis not present

## 2017-10-11 DIAGNOSIS — M9903 Segmental and somatic dysfunction of lumbar region: Secondary | ICD-10-CM | POA: Diagnosis not present

## 2017-10-11 DIAGNOSIS — M9901 Segmental and somatic dysfunction of cervical region: Secondary | ICD-10-CM | POA: Diagnosis not present

## 2017-10-12 DIAGNOSIS — M9903 Segmental and somatic dysfunction of lumbar region: Secondary | ICD-10-CM | POA: Diagnosis not present

## 2017-10-12 DIAGNOSIS — M5431 Sciatica, right side: Secondary | ICD-10-CM | POA: Diagnosis not present

## 2017-10-12 DIAGNOSIS — M9905 Segmental and somatic dysfunction of pelvic region: Secondary | ICD-10-CM | POA: Diagnosis not present

## 2017-10-12 DIAGNOSIS — M9901 Segmental and somatic dysfunction of cervical region: Secondary | ICD-10-CM | POA: Diagnosis not present

## 2017-10-13 ENCOUNTER — Other Ambulatory Visit: Payer: Self-pay

## 2017-10-13 ENCOUNTER — Inpatient Hospital Stay (HOSPITAL_BASED_OUTPATIENT_CLINIC_OR_DEPARTMENT_OTHER): Payer: Medicare Other | Admitting: Hematology & Oncology

## 2017-10-13 ENCOUNTER — Encounter: Payer: Self-pay | Admitting: Hematology & Oncology

## 2017-10-13 ENCOUNTER — Inpatient Hospital Stay: Payer: Medicare Other

## 2017-10-13 VITALS — BP 116/88 | HR 50 | Temp 98.1°F | Resp 18 | Wt 217.0 lb

## 2017-10-13 DIAGNOSIS — Z7901 Long term (current) use of anticoagulants: Secondary | ICD-10-CM | POA: Diagnosis not present

## 2017-10-13 DIAGNOSIS — C9001 Multiple myeloma in remission: Secondary | ICD-10-CM

## 2017-10-13 DIAGNOSIS — Z86718 Personal history of other venous thrombosis and embolism: Secondary | ICD-10-CM

## 2017-10-13 DIAGNOSIS — Z9221 Personal history of antineoplastic chemotherapy: Secondary | ICD-10-CM

## 2017-10-13 DIAGNOSIS — Z7982 Long term (current) use of aspirin: Secondary | ICD-10-CM

## 2017-10-13 DIAGNOSIS — Z9484 Stem cells transplant status: Secondary | ICD-10-CM

## 2017-10-13 DIAGNOSIS — C9000 Multiple myeloma not having achieved remission: Secondary | ICD-10-CM

## 2017-10-13 DIAGNOSIS — R7989 Other specified abnormal findings of blood chemistry: Secondary | ICD-10-CM | POA: Diagnosis not present

## 2017-10-13 DIAGNOSIS — R42 Dizziness and giddiness: Secondary | ICD-10-CM | POA: Diagnosis not present

## 2017-10-13 DIAGNOSIS — R05 Cough: Secondary | ICD-10-CM | POA: Diagnosis not present

## 2017-10-13 LAB — COMPREHENSIVE METABOLIC PANEL WITH GFR
ALT: 61 U/L — ABNORMAL HIGH (ref 9–46)
Albumin: 4 g/dL (ref 3.6–5.1)
Alkaline phosphatase (APISO): 118 U/L — ABNORMAL HIGH (ref 40–115)
Creat: 1.59 mg/dL — ABNORMAL HIGH (ref 0.70–1.18)
Potassium: 3.7 mmol/L (ref 3.5–5.3)
Sodium: 140 mmol/L (ref 135–146)

## 2017-10-13 LAB — CMP (CANCER CENTER ONLY)
ALBUMIN: 3.4 g/dL — AB (ref 3.5–5.0)
ALT: 83 U/L — AB (ref 10–47)
AST: 83 U/L — AB (ref 11–38)
Alkaline Phosphatase: 88 U/L — ABNORMAL HIGH (ref 26–84)
Anion gap: 4 — ABNORMAL LOW (ref 5–15)
BUN: 23 mg/dL — AB (ref 7–22)
CALCIUM: 9.2 mg/dL (ref 8.0–10.3)
CO2: 32 mmol/L (ref 18–33)
CREATININE: 1.8 mg/dL — AB (ref 0.60–1.20)
Chloride: 102 mmol/L (ref 98–108)
GLUCOSE: 134 mg/dL — AB (ref 73–118)
Potassium: 3.4 mmol/L (ref 3.3–4.7)
SODIUM: 138 mmol/L (ref 128–145)
TOTAL PROTEIN: 6.9 g/dL (ref 6.4–8.1)
Total Bilirubin: 1 mg/dL (ref 0.2–1.6)

## 2017-10-13 LAB — COMPREHENSIVE METABOLIC PANEL
AG Ratio: 1.4 (calc) (ref 1.0–2.5)
AST: 70 U/L — ABNORMAL HIGH (ref 10–35)
BUN/Creatinine Ratio: 8 (calc) (ref 6–22)
BUN: 12 mg/dL (ref 7–25)
CO2: 31 mmol/L (ref 20–32)
Calcium: 9 mg/dL (ref 8.6–10.3)
Chloride: 101 mmol/L (ref 98–110)
Globulin: 2.9 g/dL (calc) (ref 1.9–3.7)
Glucose, Bld: 74 mg/dL (ref 65–139)
Total Bilirubin: 0.7 mg/dL (ref 0.2–1.2)
Total Protein: 6.9 g/dL (ref 6.1–8.1)

## 2017-10-13 LAB — CBC WITH DIFFERENTIAL/PLATELET
Basophils Absolute: 50 cells/uL (ref 0–200)
Basophils Relative: 1 %
Eosinophils Absolute: 40 cells/uL (ref 15–500)
Eosinophils Relative: 0.8 %
HCT: 35.3 % — ABNORMAL LOW (ref 38.5–50.0)
Hemoglobin: 11.3 g/dL — ABNORMAL LOW (ref 13.2–17.1)
Lymphs Abs: 1340 cells/uL (ref 850–3900)
MCH: 29.1 pg (ref 27.0–33.0)
MCHC: 32 g/dL (ref 32.0–36.0)
MCV: 91 fL (ref 80.0–100.0)
MPV: 9.4 fL (ref 7.5–12.5)
Monocytes Relative: 7.7 %
Neutro Abs: 3185 cells/uL (ref 1500–7800)
Neutrophils Relative %: 63.7 %
Platelets: 168 Thousand/uL (ref 140–400)
RBC: 3.88 Million/uL — ABNORMAL LOW (ref 4.20–5.80)
RDW: 17.4 % — ABNORMAL HIGH (ref 11.0–15.0)
Total Lymphocyte: 26.8 %
WBC mixed population: 385 cells/uL (ref 200–950)
WBC: 5 Thousand/uL (ref 3.8–10.8)

## 2017-10-13 LAB — CBC WITH DIFFERENTIAL (CANCER CENTER ONLY)
BASOS ABS: 0.1 10*3/uL (ref 0.0–0.1)
BASOS PCT: 1 %
EOS PCT: 1 %
Eosinophils Absolute: 0 10*3/uL (ref 0.0–0.5)
HEMATOCRIT: 34.1 % — AB (ref 38.7–49.9)
Hemoglobin: 11.1 g/dL — ABNORMAL LOW (ref 13.0–17.1)
Lymphocytes Relative: 29 %
Lymphs Abs: 2.3 10*3/uL (ref 0.9–3.3)
MCH: 29.8 pg (ref 28.0–33.4)
MCHC: 32.6 g/dL (ref 32.0–35.9)
MCV: 91.4 fL (ref 82.0–98.0)
MONO ABS: 0.7 10*3/uL (ref 0.1–0.9)
MONOS PCT: 9 %
NEUTROS ABS: 4.8 10*3/uL (ref 1.5–6.5)
Neutrophils Relative %: 60 %
PLATELETS: 187 10*3/uL (ref 145–400)
RBC: 3.73 MIL/uL — ABNORMAL LOW (ref 4.20–5.70)
RDW: 18.3 % — AB (ref 11.1–15.7)
WBC Count: 8 10*3/uL (ref 4.0–10.0)

## 2017-10-13 LAB — CULTURE BLOOD MANUAL
Micro Number: 91014209
Result: NO GROWTH
Specimen Quality: ADEQUATE

## 2017-10-13 LAB — LACTATE DEHYDROGENASE: LDH: 245 U/L — AB (ref 98–192)

## 2017-10-13 NOTE — Progress Notes (Signed)
Hematology and Oncology Follow Up Visit  Deovion Batrez [id number] 1940-12-08 77 y.o. 10/13/2017   Principle Diagnosis:  IgG kappa myeloma - Trisomy 11 by FISH Acute thromboembolism of the right lower leg  Current Therapy:   Revlimid 10mg  po q day (21/28) -- d/c on 09/15/2017 Zometa 4 mg IV every 3 months  - next dose in -08/2017 Xarelto 10 mg by mouth daily  Past Therapy:  Patient s/p cycle 5 of Velcade/Revlimid/Decadron S/P ASCT at [facility] on 04/02/2015   Interim History:  Mr. Tuggle is here today for follow-up.  He continues have quite a few issues.  However, he is not complaining of any back pain.  He went to see a Restaurant manager, fast food.  He said this really helped his back a lot.  He now has dizziness.  He has some slurred speech.  He went to see his family doctor.  He is going for an MRI tomorrow.  He is on aspirin already.  His liver tests are also going up.  We had stopped the Revlimid.  I think that the issue might be his Lipitor.  He we will stop his Lipitor.  I do not see a problem with stopping this for 4-6 weeks.  There is been absolutely no problems with his myeloma.  He has not had a monoclonal spike.  Back in early August, his M spike was not noted.  His IgG level was 1288 mg/dL.  His kappa light chain was 4.3 mg/dL.   Overall, I would say that his performance status is ECOG 1.    Medications:  Allergies as of 10/13/2017      Reactions   Codeine Itching   Hydrocodone-acetaminophen Itching   Mushroom Extract Complex Nausea And Vomiting   Only shitake mushroom  causes this reaction.   Symbicort [budesonide-formoterol Fumarate] Itching      Medication List        Accurate as of 10/13/17  2:24 PM. Always use your most recent med list.          albuterol 108 (90 Base) MCG/ACT inhaler Commonly known as:  PROVENTIL HFA;VENTOLIN HFA Inhale 2 puffs into the lungs every 6 (six) hours as needed for wheezing or shortness of breath.   amLODipine 5 MG tablet Commonly known  as:  NORVASC TAKE 1 TABLET EVERY DAY   aspirin EC 81 MG tablet Take by mouth.   atorvastatin 40 MG tablet Commonly known as:  LIPITOR TAKE 1 TABLET EVERY DAY   azelastine 0.05 % ophthalmic solution Commonly known as:  OPTIVAR Place 2 drops into both eyes 2 (two) times daily.   carvedilol 6.25 MG tablet Commonly known as:  COREG TAKE 1 TABLET TWICE DAILY WITH A MEAL   cetirizine 10 MG tablet Commonly known as:  ZYRTEC Take 1 tablet (10 mg total) by mouth daily.   ciprofloxacin 750 MG tablet Commonly known as:  CIPRO Take 1 tablet (750 mg total) by mouth 2 (two) times daily for 10 days.   esomeprazole 40 MG capsule Commonly known as:  NEXIUM Take 1 capsule (40 mg total) by mouth daily at 12 noon.   famciclovir 500 MG tablet Commonly known as:  FAMVIR TAKE 1 TABLET EVERY DAY   fluticasone 50 MCG/ACT nasal spray Commonly known as:  FLONASE Place 2 sprays into both nostrils daily.   gabapentin 600 MG tablet Commonly known as:  NEURONTIN TAKE 1 TABLET EVERY MORNING  AND TAKE 2 TABLETS EVERY NIGHT   hydrochlorothiazide 12.5 MG capsule Commonly known as:  MICROZIDE TAKE 1 CAPSULE EVERY DAY   isosorbide mononitrate 30 MG 24 hr tablet Commonly known as:  IMDUR TAKE 1 TABLET TWICE DAILY   KLOR-CON M20 20 MEQ tablet Generic drug:  potassium chloride SA TAKE 1 TABLET TWICE DAILY   Loratadine 10 MG Caps Take 10 mg by mouth daily as needed (allergies).   LORazepam 1 MG tablet Commonly known as:  ATIVAN TAKE 1 TABLET AT BEDTIME   montelukast 10 MG tablet Commonly known as:  SINGULAIR TAKE 1 TABLET AT BEDTIME   nitroGLYCERIN 0.4 MG SL tablet Commonly known as:  NITROSTAT Place 0.4 mg under the tongue every 5 (five) minutes as needed. For chest pain   oxyCODONE-acetaminophen 10-325 MG tablet Commonly known as:  PERCOCET Take 1 tablet by mouth daily as needed for pain.   Potassium Chloride ER 20 MEQ Tbcr TAKE 1 TABLET TWICE DAILY   predniSONE 50 MG  tablet Commonly known as:  DELTASONE Take 1 tablet (50 mg total) by mouth daily.   pyridoxine 100 MG tablet Commonly known as:  B-6 Take 100 mg by mouth daily.   RA VITAMIN B-12 TR 1000 MCG Tbcr Generic drug:  Cyanocobalamin Take 1 tablet by mouth daily.   XARELTO 20 MG Tabs tablet Generic drug:  rivaroxaban TAKE 1 TABLET EVERY DAY WITH SUPPER       Allergies:  Allergies  Allergen Reactions  . Codeine Itching  . Hydrocodone-Acetaminophen Itching  . Mushroom Extract Complex Nausea And Vomiting    Only shitake mushroom  causes this reaction.  . Symbicort [Budesonide-Formoterol Fumarate] Itching    Past Medical History, Surgical history, Social history, and Family History were reviewed and updated.  Review of Systems: Review of Systems  Respiratory: Positive for cough.   Musculoskeletal: Positive for back pain.  All other systems reviewed and are negative.    Physical Exam:  weight is 217 lb (98.4 kg). His oral temperature is 98.1 F (36.7 C). His blood pressure is 116/88 and his pulse is 50 (abnormal). His respiration is 18 and oxygen saturation is 99%.   Wt Readings from Last 3 Encounters:  10/13/17 217 lb (98.4 kg)  10/06/17 216 lb (98 kg)  10/05/17 215 lb (97.5 kg)    Physical Exam  Constitutional: He is oriented to person, place, and time.  HENT:  Head: Normocephalic and atraumatic.  Mouth/Throat: Oropharynx is clear and moist.  Eyes: Pupils are equal, round, and reactive to light. EOM are normal.  Neck: Normal range of motion.  Cardiovascular: Normal rate, regular rhythm and normal heart sounds.  Pulmonary/Chest: Effort normal and breath sounds normal.  Abdominal: Soft. Bowel sounds are normal.  Musculoskeletal: Normal range of motion. He exhibits no edema, tenderness or deformity.  Lymphadenopathy:    He has no cervical adenopathy.  Neurological: He is alert and oriented to person, place, and time.  Skin: Skin is warm and dry. No rash noted. No  erythema.  Psychiatric: He has a normal mood and affect. His behavior is normal. Judgment and thought content normal.  Vitals reviewed.    Lab Results  Component Value Date   WBC 8.0 10/13/2017   HGB 11.1 (L) 10/13/2017   HCT 34.1 (L) 10/13/2017   MCV 91.4 10/13/2017   PLT 187 10/13/2017   Lab Results  Component Value Date   FERRITIN 53 02/19/2013   IRON 45 02/19/2013   TIBC 320 02/19/2013   UIBC 275 02/19/2013   IRONPCTSAT 14 (L) 02/19/2013   Lab Results  Component Value Date  RBC 3.73 (L) 10/13/2017   Lab Results  Component Value Date   KPAFRELGTCHN 42.7 (H) 09/15/2017   LAMBDASER 25.6 09/15/2017   KAPLAMBRATIO 1.67 (H) 09/15/2017   Lab Results  Component Value Date   IGGSERUM 1,288 09/15/2017   IGA 157 09/15/2017   IGMSERUM 32 09/15/2017   Lab Results  Component Value Date   TOTALPROTELP 6.7 09/15/2017   ALBUMINELP 3.5 09/15/2017   A1GS 0.2 09/15/2017   A2GS 0.7 09/15/2017   BETS 1.0 09/15/2017   BETA2SER 0.3 02/11/2015   GAMS 1.3 09/15/2017   MSPIKE Not Observed 09/15/2017   SPEI Comment 03/14/2017     Chemistry      Component Value Date/Time   NA 138 10/13/2017 1312   NA 140 01/27/2017 1044   NA 141 06/05/2015 1036   K 3.4 10/13/2017 1312   K 3.3 01/27/2017 1044   K 2.9 (LL) 06/05/2015 1036   CL 102 10/13/2017 1312   CL 101 01/27/2017 1044   CO2 32 10/13/2017 1312   CO2 28 01/27/2017 1044   CO2 27 06/05/2015 1036   BUN 23 (H) 10/13/2017 1312   BUN 14 01/27/2017 1044   BUN 13.1 06/05/2015 1036   CREATININE 1.80 (H) 10/13/2017 1312   CREATININE 1.59 (H) 10/06/2017 1215   CREATININE 1.0 06/05/2015 1036   GLU 152 03/30/2016      Component Value Date/Time   CALCIUM 9.2 10/13/2017 1312   CALCIUM 8.9 01/27/2017 1044   CALCIUM 9.4 06/05/2015 1036   ALKPHOS 88 (H) 10/13/2017 1312   ALKPHOS 126 (H) 01/27/2017 1044   ALKPHOS 94 06/05/2015 1036   AST 83 (H) 10/13/2017 1312   AST 22 06/05/2015 1036   ALT 83 (H) 10/13/2017 1312   ALT 33  01/27/2017 1044   ALT 18 06/05/2015 1036   BILITOT 1.0 10/13/2017 1312   BILITOT 0.59 06/05/2015 1036      Impression and Plan: Mr. Parekh is a very pleasant 77 yo caucasian gentleman with history of IgG kappa Myeloma and ASCT at [facility] in February 2017.   Hopefully, there is no CNS issue with respect to this dizziness.  We will see him back in 6 weeks.  We will continue to follow him.  He will had to see his cardiologist about the statin drug if his liver tests continue to go up.  Of note, his wife is going to have total knee replacement for the left knee next week.    [name], MD 8/30/20192:24 PM

## 2017-10-14 ENCOUNTER — Ambulatory Visit (HOSPITAL_BASED_OUTPATIENT_CLINIC_OR_DEPARTMENT_OTHER)
Admission: RE | Admit: 2017-10-14 | Discharge: 2017-10-14 | Disposition: A | Discharge status: Home / Self Care | Payer: Medicare Other | Source: Ambulatory Visit | Attending: Sports Medicine | Admitting: Sports Medicine

## 2017-10-14 DIAGNOSIS — R4781 Slurred speech: Secondary | ICD-10-CM | POA: Insufficient documentation

## 2017-10-14 DIAGNOSIS — R42 Dizziness and giddiness: Secondary | ICD-10-CM | POA: Diagnosis not present

## 2017-10-14 DIAGNOSIS — R269 Unspecified abnormalities of gait and mobility: Secondary | ICD-10-CM | POA: Diagnosis not present

## 2017-10-14 LAB — IGG, IGA, IGM
IgA: 125 mg/dL (ref 61–437)
IgG (Immunoglobin G), Serum: 1080 mg/dL (ref 700–1600)
IgM (Immunoglobulin M), Srm: 30 mg/dL (ref 15–143)

## 2017-10-14 MED ORDER — GADOBENATE DIMEGLUMINE 529 MG/ML IV SOLN
20.0000 mL | Freq: Once | INTRAVENOUS | Status: AC | PRN
  Administered 2017-10-14: 20 mL via INTRAVENOUS

## 2017-10-16 LAB — PROTEIN ELECTROPHORESIS, SERUM, WITH REFLEX
A/G Ratio: 1.3 (ref 0.7–1.7)
ALPHA-1-GLOBULIN: 0.2 g/dL (ref 0.0–0.4)
Albumin ELP: 3.7 g/dL (ref 2.9–4.4)
Alpha-2-Globulin: 0.7 g/dL (ref 0.4–1.0)
Beta Globulin: 0.9 g/dL (ref 0.7–1.3)
GAMMA GLOBULIN: 1.1 g/dL (ref 0.4–1.8)
Globulin, Total: 2.9 g/dL (ref 2.2–3.9)
TOTAL PROTEIN ELP: 6.6 g/dL (ref 6.0–8.5)

## 2017-10-17 ENCOUNTER — Telehealth: Payer: Self-pay | Admitting: *Deleted

## 2017-10-17 DIAGNOSIS — L218 Other seborrheic dermatitis: Secondary | ICD-10-CM | POA: Diagnosis not present

## 2017-10-17 DIAGNOSIS — D1801 Hemangioma of skin and subcutaneous tissue: Secondary | ICD-10-CM | POA: Diagnosis not present

## 2017-10-17 DIAGNOSIS — L821 Other seborrheic keratosis: Secondary | ICD-10-CM | POA: Diagnosis not present

## 2017-10-17 DIAGNOSIS — Z85828 Personal history of other malignant neoplasm of skin: Secondary | ICD-10-CM | POA: Diagnosis not present

## 2017-10-17 DIAGNOSIS — L814 Other melanin hyperpigmentation: Secondary | ICD-10-CM | POA: Diagnosis not present

## 2017-10-17 DIAGNOSIS — L579 Skin changes due to chronic exposure to nonionizing radiation, unspecified: Secondary | ICD-10-CM | POA: Diagnosis not present

## 2017-10-17 LAB — KAPPA/LAMBDA LIGHT CHAINS
Kappa free light chain: 12.1 mg/L (ref 3.3–19.4)
Kappa, lambda light chain ratio: 1.25 (ref 0.26–1.65)
Lambda free light chains: 9.7 mg/L (ref 5.7–26.3)

## 2017-10-17 NOTE — Telephone Encounter (Addendum)
Patient is aware of results  ----- Message from [name], MD sent at 10/17/2017  6:57 AM EDT ----- Call - the myeloma protein is not found!!  [name] [name]

## 2017-10-18 DIAGNOSIS — M9901 Segmental and somatic dysfunction of cervical region: Secondary | ICD-10-CM | POA: Diagnosis not present

## 2017-10-18 DIAGNOSIS — M5431 Sciatica, right side: Secondary | ICD-10-CM | POA: Diagnosis not present

## 2017-10-18 DIAGNOSIS — M9903 Segmental and somatic dysfunction of lumbar region: Secondary | ICD-10-CM | POA: Diagnosis not present

## 2017-10-18 DIAGNOSIS — M9905 Segmental and somatic dysfunction of pelvic region: Secondary | ICD-10-CM | POA: Diagnosis not present

## 2017-10-19 ENCOUNTER — Encounter: Payer: Self-pay | Admitting: Sports Medicine

## 2017-10-19 ENCOUNTER — Ambulatory Visit (INDEPENDENT_AMBULATORY_CARE_PROVIDER_SITE_OTHER): Payer: Medicare Other | Admitting: Sports Medicine

## 2017-10-19 DIAGNOSIS — E7800 Pure hypercholesterolemia, unspecified: Secondary | ICD-10-CM

## 2017-10-19 DIAGNOSIS — J449 Chronic obstructive pulmonary disease, unspecified: Secondary | ICD-10-CM

## 2017-10-19 DIAGNOSIS — J4489 Other specified chronic obstructive pulmonary disease: Secondary | ICD-10-CM

## 2017-10-19 DIAGNOSIS — M67472 Ganglion, left ankle and foot: Secondary | ICD-10-CM | POA: Diagnosis not present

## 2017-10-19 DIAGNOSIS — R4781 Slurred speech: Secondary | ICD-10-CM | POA: Diagnosis not present

## 2017-10-19 NOTE — Assessment & Plan Note (Signed)
Resolved, brain MRI was normal.

## 2017-10-19 NOTE — Progress Notes (Signed)
Subjective:    CC: Follow-up  HPI: Foot lesion: Left-sided, nontender, palpable and visible mass.  Mild, persistent.  No trauma.  This is new.  COPD: Resolved with aggressive steroid and antibiotic treatment.  Dizziness and slurred speech: Negative brain MRI, suspected labyrinthitis, improving slowly.  I reviewed the past medical history, family history, social history, surgical history, and allergies today and no changes were needed.  Please see the problem list section below in epic for further details.  Past Medical History: Past Medical History:  Diagnosis Date  . Acute deep vein thrombosis (DVT) of tibial vein of right lower extremity ([place]) 09/29/2014  . Arthritis    back  . Asthma    uses Advair bid and asmanex prn  . Cancer ([place])    multiple myeloma  . Chronic back pain    buldging disc and scoliosis  . Chronic bronchitis ([place])    couple of yrs ago  . Complication of anesthesia    severe case of hiccups after hernia surgery;required medication  . Diverticulosis   . Dizziness    related to neck issues  . Emphysema   . Enlarged prostate    takes Flomax daily  . GERD (gastroesophageal reflux disease)    takes PRotonix daily  . History of colon polyps   . Hyperlipidemia    takes Zocor daily  . Hypertension    takes HCTZ,Diovan daily and Isosorbide daily  . Itching    down left arm and leg  . Joint pain   . Macular degeneration    mild,beginning  . Multiple myeloma ([place]) 08/13/2014  . Myocardial infarction ([place]) 2010  . Neck pain   . Pain in limb 06/20/2012  . Pneumonia    hx of---12+yrs ago   Past Surgical History: Past Surgical History:  Procedure Laterality Date  . ANTERIOR CERVICAL DECOMP/DISCECTOMY FUSION  01/31/2012   Procedure: ANTERIOR CERVICAL DECOMPRESSION/DISCECTOMY FUSION 1 LEVEL;  Surgeon: Floyce Stakes, MD;  Location: [facility];  Service: Neurosurgery;  Laterality: N/A;  Cervical Five-Six  Anterior Cervical Decompression /Diskectomy  /Fusion/Plate  . BONE MARROW TRANSPLANT    . CARDIAC CATHETERIZATION  2010/2013   06/07/11 [facility]) LAD 25%, D2 75% (small), pLCx 100%, RCA 25%--medical managment   . COLONOSCOPY    . HERNIA REPAIR  2006   x 3   . salivary gland removed     left   . skin cancer removed from left side of face     basal cell carcinoma  . vein removed from left leg     Social History: Social History   Socioeconomic History  . Marital status: Married    Spouse name: Not on file  . Number of children: 3  . Years of education: Not on file  . Highest education level: Not on file  Occupational History  . Not on file  Social Needs  . Financial resource strain: Not on file  . Food insecurity:    Worry: Not on file    Inability: Not on file  . Transportation needs:    Medical: Not on file    Non-medical: Not on file  Tobacco Use  . Smoking status: Former Smoker    Years: 30.00    Types: Pipe    Last attempt to quit: 11/24/2011    Years since quitting: 5.9  . Smokeless tobacco: Former Systems developer    Quit date: 02/15/2011  . Tobacco comment: quit tobacco 3 years ago  Substance and Sexual Activity  . Alcohol use:  Yes    Alcohol/week: 0.0 standard drinks    Comment: 1-2 glasses of wine daily  . Drug use: No  . Sexual activity: Yes  Lifestyle  . Physical activity:    Days per week: Not on file    Minutes per session: Not on file  . Stress: Not on file  Relationships  . Social connections:    Talks on phone: Not on file    Gets together: Not on file    Attends religious service: Not on file    Active member of club or organization: Not on file    Attends meetings of clubs or organizations: Not on file    Relationship status: Not on file  Other Topics Concern  . Not on file  Social History Narrative  . Not on file   Family History: Family History  Problem Relation Age of Onset  . Hypertension Mother   . Heart attack Father   . Cancer Sister   . Cancer Sister    Allergies: Allergies    Allergen Reactions  . Codeine Itching  . Hydrocodone-Acetaminophen Itching  . Mushroom Extract Complex Nausea And Vomiting    Only shitake mushroom  causes this reaction.  . Symbicort [Budesonide-Formoterol Fumarate] Itching   Medications: See med rec.  Review of Systems: No fevers, chills, night sweats, weight loss, chest pain, or shortness of breath.   Objective:    General: Well Developed, well nourished, and in no acute distress.  Neuro: Alert and oriented x3, extra-ocular muscles intact, sensation grossly intact.  HEENT: Normocephalic, atraumatic, pupils equal round reactive to light, neck supple, no masses, no lymphadenopathy, thyroid nonpalpable.  Skin: Warm and dry, no rashes. Cardiac: Regular rate and rhythm, no murmurs rubs or gallops, no lower extremity edema.  Respiratory: Clear to auscultation bilaterally. Not using accessory muscles, speaking in full sentences.  Impression and Recommendations:    Slurring of speech Resolved, brain MRI was normal.  Ganglion cyst of left foot Asymptomatic, no treatment needed.  Asthma with COPD Symptoms for the most part resolved with aggressive treatment. He does have a bit of dizziness and vertiginous type symptoms which are heading in the right direction, likely viral labyrinthine origin. Declines vestibular therapy, brain MRI was normal.  Hyperlipidemia Lipitor discontinued by oncology, noted a slightly increasing ALT, AST and LDH. I would like to recheck lipids, AST, ALT and LDH in about 3 months.  I spent 25 minutes with this patient, greater than 50% was face-to-face time counseling regarding the above diagnoses, as well as the prognosis of the above diagnoses ___________________________________________ Gwen Her. Dianah Field, M.D., ABFM., CAQSM. Primary Care and [place] Instructor of [place] of [facility] of Medicine

## 2017-10-19 NOTE — Assessment & Plan Note (Signed)
Asymptomatic, no treatment needed °

## 2017-10-19 NOTE — Assessment & Plan Note (Signed)
Lipitor discontinued by oncology, noted a slightly increasing ALT, AST and LDH. I would like to recheck lipids, AST, ALT and LDH in about 3 months.

## 2017-10-19 NOTE — Assessment & Plan Note (Signed)
Symptoms for the most part resolved with aggressive treatment. He does have a bit of dizziness and vertiginous type symptoms which are heading in the right direction, likely viral labyrinthine origin. Declines vestibular therapy, brain MRI was normal.

## 2017-10-25 DIAGNOSIS — M5431 Sciatica, right side: Secondary | ICD-10-CM | POA: Diagnosis not present

## 2017-10-25 DIAGNOSIS — M9901 Segmental and somatic dysfunction of cervical region: Secondary | ICD-10-CM | POA: Diagnosis not present

## 2017-10-25 DIAGNOSIS — K5903 Drug induced constipation: Secondary | ICD-10-CM | POA: Diagnosis not present

## 2017-10-25 DIAGNOSIS — R197 Diarrhea, unspecified: Secondary | ICD-10-CM | POA: Diagnosis not present

## 2017-10-25 DIAGNOSIS — M9903 Segmental and somatic dysfunction of lumbar region: Secondary | ICD-10-CM | POA: Diagnosis not present

## 2017-10-25 DIAGNOSIS — K219 Gastro-esophageal reflux disease without esophagitis: Secondary | ICD-10-CM | POA: Diagnosis not present

## 2017-10-25 DIAGNOSIS — M9905 Segmental and somatic dysfunction of pelvic region: Secondary | ICD-10-CM | POA: Diagnosis not present

## 2017-10-27 DIAGNOSIS — M9905 Segmental and somatic dysfunction of pelvic region: Secondary | ICD-10-CM | POA: Diagnosis not present

## 2017-10-27 DIAGNOSIS — M5431 Sciatica, right side: Secondary | ICD-10-CM | POA: Diagnosis not present

## 2017-10-27 DIAGNOSIS — M9903 Segmental and somatic dysfunction of lumbar region: Secondary | ICD-10-CM | POA: Diagnosis not present

## 2017-10-27 DIAGNOSIS — M9901 Segmental and somatic dysfunction of cervical region: Secondary | ICD-10-CM | POA: Diagnosis not present

## 2017-10-30 DIAGNOSIS — M9903 Segmental and somatic dysfunction of lumbar region: Secondary | ICD-10-CM | POA: Diagnosis not present

## 2017-10-30 DIAGNOSIS — M5431 Sciatica, right side: Secondary | ICD-10-CM | POA: Diagnosis not present

## 2017-10-30 DIAGNOSIS — M9905 Segmental and somatic dysfunction of pelvic region: Secondary | ICD-10-CM | POA: Diagnosis not present

## 2017-10-30 DIAGNOSIS — M9901 Segmental and somatic dysfunction of cervical region: Secondary | ICD-10-CM | POA: Diagnosis not present

## 2017-11-02 DIAGNOSIS — M5431 Sciatica, right side: Secondary | ICD-10-CM | POA: Diagnosis not present

## 2017-11-02 DIAGNOSIS — M9903 Segmental and somatic dysfunction of lumbar region: Secondary | ICD-10-CM | POA: Diagnosis not present

## 2017-11-02 DIAGNOSIS — M9901 Segmental and somatic dysfunction of cervical region: Secondary | ICD-10-CM | POA: Diagnosis not present

## 2017-11-02 DIAGNOSIS — M9905 Segmental and somatic dysfunction of pelvic region: Secondary | ICD-10-CM | POA: Diagnosis not present

## 2017-11-05 ENCOUNTER — Other Ambulatory Visit: Payer: Self-pay | Admitting: Cardiology

## 2017-11-05 ENCOUNTER — Other Ambulatory Visit: Payer: Self-pay | Admitting: Sports Medicine

## 2017-11-05 ENCOUNTER — Other Ambulatory Visit: Payer: Self-pay | Admitting: Hematology & Oncology

## 2017-11-05 DIAGNOSIS — E7800 Pure hypercholesterolemia, unspecified: Secondary | ICD-10-CM

## 2017-11-06 ENCOUNTER — Telehealth: Payer: Self-pay | Admitting: Hematology & Oncology

## 2017-11-06 DIAGNOSIS — M9901 Segmental and somatic dysfunction of cervical region: Secondary | ICD-10-CM | POA: Diagnosis not present

## 2017-11-06 DIAGNOSIS — M9905 Segmental and somatic dysfunction of pelvic region: Secondary | ICD-10-CM | POA: Diagnosis not present

## 2017-11-06 DIAGNOSIS — M9903 Segmental and somatic dysfunction of lumbar region: Secondary | ICD-10-CM | POA: Diagnosis not present

## 2017-11-06 DIAGNOSIS — M5431 Sciatica, right side: Secondary | ICD-10-CM | POA: Diagnosis not present

## 2017-11-06 NOTE — Telephone Encounter (Signed)
Faxed medical records to: DEPT OF [place], [place]: [phone]  F: [phone] P: [phone]     COPY SCANNED

## 2017-11-07 ENCOUNTER — Encounter: Payer: Self-pay | Admitting: Hematology & Oncology

## 2017-11-09 DIAGNOSIS — M9901 Segmental and somatic dysfunction of cervical region: Secondary | ICD-10-CM | POA: Diagnosis not present

## 2017-11-09 DIAGNOSIS — M9905 Segmental and somatic dysfunction of pelvic region: Secondary | ICD-10-CM | POA: Diagnosis not present

## 2017-11-09 DIAGNOSIS — M9903 Segmental and somatic dysfunction of lumbar region: Secondary | ICD-10-CM | POA: Diagnosis not present

## 2017-11-09 DIAGNOSIS — M5431 Sciatica, right side: Secondary | ICD-10-CM | POA: Diagnosis not present

## 2017-11-14 ENCOUNTER — Encounter: Payer: Self-pay | Admitting: Sports Medicine

## 2017-11-14 ENCOUNTER — Ambulatory Visit (INDEPENDENT_AMBULATORY_CARE_PROVIDER_SITE_OTHER): Payer: Medicare Other | Admitting: Sports Medicine

## 2017-11-14 VITALS — BP 114/66 | HR 64

## 2017-11-14 DIAGNOSIS — M170 Bilateral primary osteoarthritis of knee: Secondary | ICD-10-CM | POA: Diagnosis not present

## 2017-11-14 DIAGNOSIS — Z23 Encounter for immunization: Secondary | ICD-10-CM

## 2017-11-14 DIAGNOSIS — M47815 Spondylosis without myelopathy or radiculopathy, thoracolumbar region: Secondary | ICD-10-CM

## 2017-11-14 MED ORDER — OXYCODONE-ACETAMINOPHEN 10-325 MG PO TABS: 1.0000 | ORAL_TABLET | Freq: Every day | ORAL | 0 refills | Status: DC | PRN

## 2017-11-14 NOTE — Progress Notes (Signed)
Subjective:    CC: Bilateral knee pain  HPI: Knee osteoarthritis: [name] [name] returns, he is a pleasant 77 year old male with bilateral knee osteoarthritis, last injection was in March 2017, now having a recurrence of pain, moderate, persistent, localized at the joint lines without radiation.  Of note his wife just had her knee replacement.  I reviewed the past medical history, family history, social history, surgical history, and allergies today and no changes were needed.  Please see the problem list section below in epic for further details.  Past Medical History: Past Medical History:  Diagnosis Date  . Acute deep vein thrombosis (DVT) of tibial vein of right lower extremity ([place]) 09/29/2014  . Arthritis    back  . Asthma    uses Advair bid and asmanex prn  . Cancer ([place])    multiple myeloma  . Chronic back pain    buldging disc and scoliosis  . Chronic bronchitis ([place])    couple of yrs ago  . Complication of anesthesia    severe case of hiccups after hernia surgery;required medication  . Diverticulosis   . Dizziness    related to neck issues  . Emphysema   . Enlarged prostate    takes Flomax daily  . GERD (gastroesophageal reflux disease)    takes PRotonix daily  . History of colon polyps   . Hyperlipidemia    takes Zocor daily  . Hypertension    takes HCTZ,Diovan daily and Isosorbide daily  . Itching    down left arm and leg  . Joint pain   . Macular degeneration    mild,beginning  . Multiple myeloma ([place]) 08/13/2014  . Myocardial infarction ([place]) 2010  . Neck pain   . Pain in limb 06/20/2012  . Pneumonia    hx of---12+yrs ago   Past Surgical History: Past Surgical History:  Procedure Laterality Date  . ANTERIOR CERVICAL DECOMP/DISCECTOMY FUSION  01/31/2012   Procedure: ANTERIOR CERVICAL DECOMPRESSION/DISCECTOMY FUSION 1 LEVEL;  Surgeon: Floyce Stakes, MD;  Location: [facility];  Service: Neurosurgery;  Laterality: N/A;  Cervical Five-Six  Anterior Cervical  Decompression /Diskectomy /Fusion/Plate  . BONE MARROW TRANSPLANT    . CARDIAC CATHETERIZATION  2010/2013   06/07/11 [facility]) LAD 25%, D2 75% (small), pLCx 100%, RCA 25%--medical managment   . COLONOSCOPY    . HERNIA REPAIR  2006   x 3   . salivary gland removed     left   . skin cancer removed from left side of face     basal cell carcinoma  . vein removed from left leg     Social History: Social History   Socioeconomic History  . Marital status: Married    Spouse name: Not on file  . Number of children: 3  . Years of education: Not on file  . Highest education level: Not on file  Occupational History  . Not on file  Social Needs  . Financial resource strain: Not on file  . Food insecurity:    Worry: Not on file    Inability: Not on file  . Transportation needs:    Medical: Not on file    Non-medical: Not on file  Tobacco Use  . Smoking status: Former Smoker    Years: 30.00    Types: Pipe    Last attempt to quit: 11/24/2011    Years since quitting: 5.9  . Smokeless tobacco: Former Systems developer    Quit date: 02/15/2011  . Tobacco comment: quit tobacco 3 years ago  Substance and Sexual Activity  . Alcohol use: Yes    Alcohol/week: 0.0 standard drinks    Comment: 1-2 glasses of wine daily  . Drug use: No  . Sexual activity: Yes  Lifestyle  . Physical activity:    Days per week: Not on file    Minutes per session: Not on file  . Stress: Not on file  Relationships  . Social connections:    Talks on phone: Not on file    Gets together: Not on file    Attends religious service: Not on file    Active member of club or organization: Not on file    Attends meetings of clubs or organizations: Not on file    Relationship status: Not on file  Other Topics Concern  . Not on file  Social History Narrative  . Not on file   Family History: Family History  Problem Relation Age of Onset  . Hypertension Mother   . Heart attack Father   . Cancer Sister   . Cancer Sister     Allergies: Allergies  Allergen Reactions  . Codeine Itching  . Hydrocodone-Acetaminophen Itching  . Mushroom Extract Complex Nausea And Vomiting    Only shitake mushroom  causes this reaction.  . Symbicort [Budesonide-Formoterol Fumarate] Itching   Medications: See med rec.  Review of Systems: No fevers, chills, night sweats, weight loss, chest pain, or shortness of breath.   Objective:    General: Well Developed, well nourished, and in no acute distress.  Neuro: Alert and oriented x3, extra-ocular muscles intact, sensation grossly intact.  HEENT: Normocephalic, atraumatic, pupils equal round reactive to light, neck supple, no masses, no lymphadenopathy, thyroid nonpalpable.  Skin: Warm and dry, no rashes. Cardiac: Regular rate and rhythm, no murmurs rubs or gallops, no lower extremity edema.  Respiratory: Clear to auscultation bilaterally. Not using accessory muscles, speaking in full sentences. Bilateral knees: Normal to inspection with no erythema or effusion or obvious bony abnormalities. Tender at the joint lines ROM normal in flexion and extension and lower leg rotation. Ligaments with solid consistent endpoints including ACL, PCL, LCL, MCL. Negative Mcmurray's and provocative meniscal tests. Non painful patellar compression. Patellar and quadriceps tendons unremarkable. Hamstring and quadriceps strength is normal.  Procedure: Real-time Ultrasound Guided Injection of right knee Device: GE Logiq E  Verbal informed consent obtained.  Time-out conducted.  Noted no overlying erythema, induration, or other signs of local infection.  Skin prepped in a sterile fashion.  Local anesthesia: Topical Ethyl chloride.  With sterile technique and under real time ultrasound guidance: 1 cc Kenalog 40, 2 cc lidocaine, 2 cc bupivacaine injected easily. Completed without difficulty  Pain immediately resolved suggesting accurate placement of the medication.  Advised to call if  fevers/chills, erythema, induration, drainage, or persistent bleeding.  Images permanently stored and available for review in the ultrasound unit.  Impression: Technically successful ultrasound guided injection.  Procedure: Real-time Ultrasound Guided Injection of left knee Device: GE Logiq E  Verbal informed consent obtained.  Time-out conducted.  Noted no overlying erythema, induration, or other signs of local infection.  Skin prepped in a sterile fashion.  Local anesthesia: Topical Ethyl chloride.  With sterile technique and under real time ultrasound guidance: 1 cc Kenalog 40, 2 cc lidocaine, 2 cc bupivacaine injected easily. Completed without difficulty  Pain immediately resolved suggesting accurate placement of the medication.  Advised to call if fevers/chills, erythema, induration, drainage, or persistent bleeding.  Images permanently stored and available for review in the  ultrasound unit.  Impression: Technically successful ultrasound guided injection.  Impression and Recommendations:    Primary osteoarthritis of both knees Bilateral knee injection, previous injections were in March 2017. ___________________________________________ Gwen Her. Dianah Field, M.D., ABFM., CAQSM. Primary Care and [place] Instructor of [place] of [facility] of Medicine

## 2017-11-14 NOTE — Assessment & Plan Note (Signed)
Bilateral knee injection, previous injections were in March 2017.

## 2017-11-15 DIAGNOSIS — M5431 Sciatica, right side: Secondary | ICD-10-CM | POA: Diagnosis not present

## 2017-11-15 DIAGNOSIS — M9901 Segmental and somatic dysfunction of cervical region: Secondary | ICD-10-CM | POA: Diagnosis not present

## 2017-11-15 DIAGNOSIS — M9903 Segmental and somatic dysfunction of lumbar region: Secondary | ICD-10-CM | POA: Diagnosis not present

## 2017-11-15 DIAGNOSIS — M9905 Segmental and somatic dysfunction of pelvic region: Secondary | ICD-10-CM | POA: Diagnosis not present

## 2017-11-16 ENCOUNTER — Other Ambulatory Visit: Payer: Self-pay | Admitting: Sports Medicine

## 2017-11-16 DIAGNOSIS — M47815 Spondylosis without myelopathy or radiculopathy, thoracolumbar region: Secondary | ICD-10-CM

## 2017-11-22 ENCOUNTER — Encounter: Payer: Self-pay | Admitting: Sports Medicine

## 2017-11-22 MED ORDER — GABAPENTIN 600 MG PO TABS: ORAL_TABLET | ORAL | 11 refills | Status: DC

## 2017-11-23 MED ORDER — GABAPENTIN 600 MG PO TABS: ORAL_TABLET | ORAL | 3 refills | Status: DC

## 2017-11-23 NOTE — Addendum Note (Signed)
Addended by: Silverio Decamp on: 11/23/2017 09:43 AM   Modules accepted: Orders

## 2017-11-24 ENCOUNTER — Inpatient Hospital Stay: Payer: Medicare Other

## 2017-11-24 ENCOUNTER — Other Ambulatory Visit: Payer: Self-pay

## 2017-11-24 ENCOUNTER — Inpatient Hospital Stay: Payer: Medicare Other | Attending: Hematology & Oncology | Admitting: Hematology & Oncology

## 2017-11-24 VITALS — BP 108/65 | HR 48 | Temp 97.6°F | Resp 18 | Wt 213.0 lb

## 2017-11-24 DIAGNOSIS — C9001 Multiple myeloma in remission: Secondary | ICD-10-CM | POA: Insufficient documentation

## 2017-11-24 DIAGNOSIS — C9000 Multiple myeloma not having achieved remission: Secondary | ICD-10-CM

## 2017-11-24 DIAGNOSIS — E7800 Pure hypercholesterolemia, unspecified: Secondary | ICD-10-CM | POA: Diagnosis not present

## 2017-11-24 DIAGNOSIS — D500 Iron deficiency anemia secondary to blood loss (chronic): Secondary | ICD-10-CM

## 2017-11-24 DIAGNOSIS — R5383 Other fatigue: Secondary | ICD-10-CM

## 2017-11-24 LAB — CBC WITH DIFFERENTIAL (CANCER CENTER ONLY)
ABS IMMATURE GRANULOCYTES: 0.07 10*3/uL (ref 0.00–0.07)
BASOS PCT: 0 %
Basophils Absolute: 0 10*3/uL (ref 0.0–0.1)
Eosinophils Absolute: 0 10*3/uL (ref 0.0–0.5)
Eosinophils Relative: 0 %
HCT: 34.2 % — ABNORMAL LOW (ref 39.0–52.0)
HEMOGLOBIN: 10.8 g/dL — AB (ref 13.0–17.0)
IMMATURE GRANULOCYTES: 1 %
LYMPHS PCT: 31 %
Lymphs Abs: 1.6 10*3/uL (ref 0.7–4.0)
MCH: 30.4 pg (ref 26.0–34.0)
MCHC: 31.6 g/dL (ref 30.0–36.0)
MCV: 96.3 fL (ref 80.0–100.0)
Monocytes Absolute: 0.6 10*3/uL (ref 0.1–1.0)
Monocytes Relative: 11 %
NEUTROS ABS: 2.9 10*3/uL (ref 1.7–7.7)
NEUTROS PCT: 57 %
PLATELETS: 149 10*3/uL — AB (ref 150–400)
RBC: 3.55 MIL/uL — AB (ref 4.22–5.81)
RDW: 17.5 % — ABNORMAL HIGH (ref 11.5–15.5)
WBC Count: 5.2 10*3/uL (ref 4.0–10.5)
nRBC: 0 % (ref 0.0–0.2)

## 2017-11-24 LAB — CMP (CANCER CENTER ONLY)
ALK PHOS: 97 U/L — AB (ref 26–84)
ALT: 25 U/L (ref 10–47)
AST: 23 U/L (ref 11–38)
Albumin: 3.4 g/dL — ABNORMAL LOW (ref 3.5–5.0)
Anion gap: 0 — ABNORMAL LOW (ref 5–15)
BUN: 15 mg/dL (ref 7–22)
CALCIUM: 8.6 mg/dL (ref 8.0–10.3)
CO2: 31 mmol/L (ref 18–33)
Chloride: 110 mmol/L — ABNORMAL HIGH (ref 98–108)
Creatinine: 1.3 mg/dL — ABNORMAL HIGH (ref 0.60–1.20)
GLUCOSE: 93 mg/dL (ref 73–118)
Potassium: 3.9 mmol/L (ref 3.3–4.7)
SODIUM: 137 mmol/L (ref 128–145)
TOTAL PROTEIN: 6.2 g/dL — AB (ref 6.4–8.1)
Total Bilirubin: 0.8 mg/dL (ref 0.2–1.6)

## 2017-11-24 NOTE — Progress Notes (Signed)
Hematology and Oncology Follow Up Visit  [name] [name] [id number] Jul 02, 1940 77 y.o. 11/24/2017   Principle Diagnosis:  IgG kappa myeloma - Trisomy 11 by FISH Acute thromboembolism of the right lower leg  Current Therapy:   Revlimid 10mg  po q day (21/28) -- d/c on 09/15/2017 Zometa 4 mg IV every 3 months  - next dose in -08/2017 Xarelto 10 mg by mouth daily  Past Therapy:  Patient s/p cycle 5 of Velcade/Revlimid/Decadron S/P ASCT at [facility] on 04/02/2015   Interim History:  [name] [name] is here today for follow-up.  I had him come back a little bit early so we could not follow-up on his liver function studies.  He is off his Lipitor.  Liver function studies have improved.  They have normalized.  His cardiologist will decide what agent to improve him on for his cholesterol.  Otherwise he just feels tired.  His wife had her knee surgery.  She is doing better right now.  He is still having some issues with his balance.  We did an MRI of his brain.  This was unremarkable for any vascular issues or any parenchymal problem.  He has had no fever.  He has had no problems with bowels or bladder.  Overall, I would say that his performance status is ECOG 1.    Medications:  Allergies as of 11/24/2017      Reactions   Budesonide-formoterol Fumarate Itching, Other (See Comments)   Codeine Itching   Hydrocodone-acetaminophen Itching   Mushroom Extract Complex Nausea And Vomiting, Other (See Comments)   Only shitake mushroom  causes this reaction. Flu-like symptoms from portabello mushrooms      Medication List        Accurate as of 11/24/17 12:54 PM. Always use your most recent med list.          albuterol 108 (90 Base) MCG/ACT inhaler Commonly known as:  PROVENTIL HFA;VENTOLIN HFA Inhale 2 puffs into the lungs every 6 (six) hours as needed for wheezing or shortness of breath.   amLODipine 5 MG tablet Commonly known as:  NORVASC TAKE 1 TABLET EVERY DAY   aspirin EC 81 MG  tablet Take by mouth.   atorvastatin 40 MG tablet Commonly known as:  LIPITOR TAKE 1 TABLET EVERY DAY   azelastine 0.05 % ophthalmic solution Commonly known as:  OPTIVAR Place 2 drops into both eyes 2 (two) times daily.   carvedilol 6.25 MG tablet Commonly known as:  COREG TAKE 1 TABLET TWICE DAILY WITH A MEAL   cetirizine 10 MG tablet Commonly known as:  ZYRTEC Take 1 tablet (10 mg total) by mouth daily.   esomeprazole 40 MG capsule Commonly known as:  NEXIUM Take 1 capsule (40 mg total) by mouth daily at 12 noon.   famciclovir 500 MG tablet Commonly known as:  FAMVIR TAKE 1 TABLET EVERY DAY   fluticasone 50 MCG/ACT nasal spray Commonly known as:  FLONASE Place 2 sprays into both nostrils daily.   gabapentin 600 MG tablet Commonly known as:  NEURONTIN TAKE 1 TABLET EVERY MORNING  AND TAKE 2 TABLETS EVERY NIGHT   hydrochlorothiazide 12.5 MG capsule Commonly known as:  MICROZIDE TAKE 1 CAPSULE EVERY DAY   isosorbide mononitrate 30 MG 24 hr tablet Commonly known as:  IMDUR TAKE 1 TABLET TWICE DAILY   Loratadine 10 MG Caps Take 10 mg by mouth daily as needed (allergies).   LORazepam 1 MG tablet Commonly known as:  ATIVAN TAKE 1 TABLET AT BEDTIME  montelukast 10 MG tablet Commonly known as:  SINGULAIR TAKE 1 TABLET AT BEDTIME   nitroGLYCERIN 0.4 MG SL tablet Commonly known as:  NITROSTAT Place 0.4 mg under the tongue every 5 (five) minutes as needed. For chest pain   oxyCODONE-acetaminophen 10-325 MG tablet Commonly known as:  PERCOCET TAKE 1 TABLET BY MOUTH ONCE DAILY AS NEEDED FOR PAIN   Potassium Chloride ER 20 MEQ Tbcr TAKE 1 TABLET TWICE DAILY   potassium chloride SA 20 MEQ tablet Commonly known as:  K-DUR,KLOR-CON TAKE 1 TABLET TWICE DAILY   pyridoxine 100 MG tablet Commonly known as:  B-6 Take 100 mg by mouth daily.   RA VITAMIN B-12 TR 1000 MCG Tbcr Generic drug:  Cyanocobalamin Take 1 tablet by mouth daily.   XARELTO 20 MG Tabs  tablet Generic drug:  rivaroxaban TAKE 1 TABLET EVERY DAY WITH SUPPER       Allergies:  Allergies  Allergen Reactions  . Budesonide-Formoterol Fumarate Itching and Other (See Comments)  . Codeine Itching  . Hydrocodone-Acetaminophen Itching  . Mushroom Extract Complex Nausea And Vomiting and Other (See Comments)    Only shitake mushroom  causes this reaction. Flu-like symptoms from portabello mushrooms    Past Medical History, Surgical history, Social history, and Family History were reviewed and updated.  Review of Systems: Review of Systems  Respiratory: Positive for cough.   Musculoskeletal: Positive for back pain.  All other systems reviewed and are negative.    Physical Exam:  weight is 213 lb (96.6 kg). His oral temperature is 97.6 F (36.4 C). His blood pressure is 108/65 and his pulse is 48 (abnormal). His respiration is 18 and oxygen saturation is 98%.   Wt Readings from Last 3 Encounters:  11/24/17 213 lb (96.6 kg)  10/19/17 218 lb (98.9 kg)  10/13/17 217 lb (98.4 kg)    Physical Exam  Constitutional: He is oriented to person, place, and time.  HENT:  Head: Normocephalic and atraumatic.  Mouth/Throat: Oropharynx is clear and moist.  Eyes: Pupils are equal, round, and reactive to light. EOM are normal.  Neck: Normal range of motion.  Cardiovascular: Normal rate, regular rhythm and normal heart sounds.  Pulmonary/Chest: Effort normal and breath sounds normal.  Abdominal: Soft. Bowel sounds are normal.  Musculoskeletal: Normal range of motion. He exhibits no edema, tenderness or deformity.  Lymphadenopathy:    He has no cervical adenopathy.  Neurological: He is alert and oriented to person, place, and time.  Skin: Skin is warm and dry. No rash noted. No erythema.  Psychiatric: He has a normal mood and affect. His behavior is normal. Judgment and thought content normal.  Vitals reviewed.    Lab Results  Component Value Date   WBC 5.2 11/24/2017   HGB  10.8 (L) 11/24/2017   HCT 34.2 (L) 11/24/2017   MCV 96.3 11/24/2017   PLT 149 (L) 11/24/2017   Lab Results  Component Value Date   FERRITIN 53 02/19/2013   IRON 45 02/19/2013   TIBC 320 02/19/2013   UIBC 275 02/19/2013   IRONPCTSAT 14 (L) 02/19/2013   Lab Results  Component Value Date   RBC 3.55 (L) 11/24/2017   Lab Results  Component Value Date   KPAFRELGTCHN 12.1 10/13/2017   LAMBDASER 9.7 10/13/2017   KAPLAMBRATIO 1.25 10/13/2017   Lab Results  Component Value Date   IGGSERUM 1,080 10/13/2017   IGA 125 10/13/2017   IGMSERUM 30 10/13/2017   Lab Results  Component Value Date   TOTALPROTELP 6.6  10/13/2017   ALBUMINELP 3.7 10/13/2017   A1GS 0.2 10/13/2017   A2GS 0.7 10/13/2017   BETS 0.9 10/13/2017   BETA2SER 0.3 02/11/2015   GAMS 1.1 10/13/2017   MSPIKE Not Observed 10/13/2017   SPEI Comment 03/14/2017     Chemistry      Component Value Date/Time   NA 137 11/24/2017 1146   NA 140 01/27/2017 1044   NA 141 06/05/2015 1036   K 3.9 11/24/2017 1146   K 3.3 01/27/2017 1044   K 2.9 (LL) 06/05/2015 1036   CL 110 (H) 11/24/2017 1146   CL 101 01/27/2017 1044   CO2 31 11/24/2017 1146   CO2 28 01/27/2017 1044   CO2 27 06/05/2015 1036   BUN 15 11/24/2017 1146   BUN 14 01/27/2017 1044   BUN 13.1 06/05/2015 1036   CREATININE 1.30 (H) 11/24/2017 1146   CREATININE 1.59 (H) 10/06/2017 1215   CREATININE 1.0 06/05/2015 1036   GLU 152 03/30/2016      Component Value Date/Time   CALCIUM 8.6 11/24/2017 1146   CALCIUM 8.9 01/27/2017 1044   CALCIUM 9.4 06/05/2015 1036   ALKPHOS 97 (H) 11/24/2017 1146   ALKPHOS 126 (H) 01/27/2017 1044   ALKPHOS 94 06/05/2015 1036   AST 23 11/24/2017 1146   AST 22 06/05/2015 1036   ALT 25 11/24/2017 1146   ALT 33 01/27/2017 1044   ALT 18 06/05/2015 1036   BILITOT 0.8 11/24/2017 1146   BILITOT 0.59 06/05/2015 1036      Impression and Plan: Mr. Gibbons is a very pleasant 77 yo caucasian gentleman with history of IgG kappa Myeloma  and ASCT at [facility] in February 2017.   Thankfully, myeloma still is not an issue for [place].  He has other health problems that hopefully will get get dressed by his other doctors so that he will feel a little bit better.  We still have him off the Revlimid.  I will keep him off the Revlimid.  I will see him back in about 4 or 5 weeks.  Hopefully he will be feeling better.  [name], MD 10/11/201912:54 PM

## 2017-11-25 LAB — IGG, IGA, IGM
IGM (IMMUNOGLOBULIN M), SRM: 30 mg/dL (ref 15–143)
IgA: 54 mg/dL — ABNORMAL LOW (ref 61–437)
IgG (Immunoglobin G), Serum: 900 mg/dL (ref 700–1600)

## 2017-11-27 LAB — KAPPA/LAMBDA LIGHT CHAINS
Kappa free light chain: 8.3 mg/L (ref 3.3–19.4)
Kappa, lambda light chain ratio: 0.9 (ref 0.26–1.65)
Lambda free light chains: 9.2 mg/L (ref 5.7–26.3)

## 2017-11-27 LAB — PROTEIN ELECTROPHORESIS, SERUM, WITH REFLEX
A/G Ratio: 1.4 (ref 0.7–1.7)
ALBUMIN ELP: 3.4 g/dL (ref 2.9–4.4)
ALPHA-1-GLOBULIN: 0.1 g/dL (ref 0.0–0.4)
Alpha-2-Globulin: 0.7 g/dL (ref 0.4–1.0)
Beta Globulin: 0.8 g/dL (ref 0.7–1.3)
GAMMA GLOBULIN: 0.9 g/dL (ref 0.4–1.8)
Globulin, Total: 2.5 g/dL (ref 2.2–3.9)
Total Protein ELP: 5.9 g/dL — ABNORMAL LOW (ref 6.0–8.5)

## 2017-11-30 DIAGNOSIS — M9905 Segmental and somatic dysfunction of pelvic region: Secondary | ICD-10-CM | POA: Diagnosis not present

## 2017-11-30 DIAGNOSIS — M5431 Sciatica, right side: Secondary | ICD-10-CM | POA: Diagnosis not present

## 2017-11-30 DIAGNOSIS — M9901 Segmental and somatic dysfunction of cervical region: Secondary | ICD-10-CM | POA: Diagnosis not present

## 2017-11-30 DIAGNOSIS — M9903 Segmental and somatic dysfunction of lumbar region: Secondary | ICD-10-CM | POA: Diagnosis not present

## 2017-12-11 ENCOUNTER — Ambulatory Visit (INDEPENDENT_AMBULATORY_CARE_PROVIDER_SITE_OTHER): Payer: Medicare Other | Admitting: Emergency Medicine

## 2017-12-11 ENCOUNTER — Encounter: Payer: Self-pay | Admitting: Emergency Medicine

## 2017-12-11 DIAGNOSIS — I2699 Other pulmonary embolism without acute cor pulmonale: Secondary | ICD-10-CM | POA: Diagnosis not present

## 2017-12-11 DIAGNOSIS — J449 Chronic obstructive pulmonary disease, unspecified: Secondary | ICD-10-CM

## 2017-12-11 DIAGNOSIS — J479 Bronchiectasis, uncomplicated: Secondary | ICD-10-CM

## 2017-12-11 DIAGNOSIS — G4733 Obstructive sleep apnea (adult) (pediatric): Secondary | ICD-10-CM | POA: Diagnosis not present

## 2017-12-11 NOTE — Patient Instructions (Addendum)
Please continue cetirizine (Zyrtec) 10 mg once daily. Okay to stay off of montelukast (Singulair) for now.  We may need to add this back if your congestion worsens or next spring during the allergy season. Keep fluticasone nasal spray available to use 2 sprays each nostril once daily if needed for congestion or allergies. Continue your Nexium as you have been taking it. Keep albuterol available to use 2 puffs if needed for shortness of breath. Continue to use her CPAP every night as you have been doing. Follow with Dr Lamonte Sakai in 6 months or sooner if you have any problems

## 2017-12-11 NOTE — Assessment & Plan Note (Signed)
Currently well managed, treated with CPAP.  Good compliance, good clinical response.

## 2017-12-11 NOTE — Assessment & Plan Note (Addendum)
Based on the improvement in his cough and chest congestion with conservative measures I do not believe that this reflects a flare of his bronchiectasis or any evidence for an opportunistic infection.  We will need to continue surveillance since he is at risk for both  Continue therapy for his PND and GERD.  He tolerated coming off Singulair after he was changed to cetirizine, uses nasal steroid only as needed.  Explained to him today that he may need to add back the Singulair this spring, may need more aggressive therapy during the allergy seasons.  Nexium seems to manage his GERD adequately.

## 2017-12-11 NOTE — Assessment & Plan Note (Signed)
Albuterol as needed 

## 2017-12-11 NOTE — Assessment & Plan Note (Signed)
On chronic anticoagulation. 

## 2017-12-11 NOTE — Progress Notes (Signed)
Subjective:    Patient ID: [name] [name], male    DOB: Jul 24, 1940, 77 y.o.   MRN: [id number]  HPI 77 year old gentleman, former smoker (pack years) with a history of multiple myeloma and bone marrow transplant at [facility] 03/2015, pulmonary embolism and DVT on lifelong anticoagulation, obstructive sleep apnea on CPAP.  He has been followed for COPD, no spirometry available in our system.  Also with pulmonary nodules followed by CT chest.  Most recent was 09/18/2017 which I have reviewed and which shows some right middle lobe and lingular, left lower lobe bronchiectatic change, stable LLL scar / inflammation, small left pleural effusion and a stable left lower lobe 6 mm nodule.   He was treated with Revlimid x 2 years, just stopped it. Is on famvir.   He has been producing more colored mucous for about 3 months, was treated with abx x 2 by Dr Katheran Awe and prompted the CT chest as above. The mucous thinned out some w abx, then he caught URI about 2 weeks ago and has had some hoarse voice, increased mucous with color.  He has chronic sinus drainage, GERD. Has breakthrough PND, no breakthrough GERD.   Currently on Singulair, has albuterol to use as needed. CPAP compliance is excellent, he has significant clinical benefit. More energy, less daytime SOB.   Needs repeat CT chest 1 year (at least) for nodule, maybe sooner for BTX.   ROV 12/11/17 --Mr. Junio follows up today for his history of DVT/PE, COPD, mild bronchiectasis noted on CT scan of the chest, chronic cough, OSA on CPAP.Marland Kitchen  He also has a history of pulmonary nodules, notably a stable 6 mm left lower lobe nodule that is likely benign. He reports today that his chest congestion is better. He is having some eye tearing, occasional sneeze. He was on singulair, started zyrtec last time and is using flonase prn. He tried stopping singulair and has done OK. He is on nexium qd without any breakthrough.  CPAP compliance and clinical benefit is excellent.      Review of Systems      Objective:   Physical Exam  Vitals:   12/11/17 1105  BP: 112/78  Pulse: (!) 55  SpO2: 98%  Weight: 218 lb (98.9 kg)  Height: 6' (1.829 m)   Gen: Pleasant, well-nourished, in no distress,  normal affect  ENT: No lesions,  mouth clear,  oropharynx clear, no postnasal drip  Neck: No JVD, no stridor  Lungs: No use of accessory muscles, coarse at B bases, otherwise clear, no wheeze  Cardiovascular: RRR, heart sounds normal, no murmur or gallops, no peripheral edema  Musculoskeletal: No deformities, no cyanosis or clubbing  Neuro: alert, non focal  Skin: Warm, no lesions or rash     Assessment & Plan:  Bronchiectasis without complication (HCC) Based on the improvement in his cough and chest congestion with conservative measures I do not believe that this reflects a flare of his bronchiectasis or any evidence for an opportunistic infection.  We will need to continue surveillance since he is at risk for both  Continue therapy for his PND and GERD.  He tolerated coming off Singulair after he was changed to cetirizine, uses nasal steroid only as needed.  Explained to him today that he may need to add back the Singulair this spring, may need more aggressive therapy during the allergy seasons.  Nexium seems to manage his GERD adequately.  Obstructive sleep apnea Currently well managed, treated with CPAP.  Good compliance, good  clinical response.  Asthma with COPD Albuterol as needed  Pulmonary embolism ([place]) On chronic anticoagulation  Baltazar Apo, MD, PhD 12/11/2017, 12:36 PM [facility] [phone] or if no answer [phone]

## 2017-12-12 ENCOUNTER — Encounter: Payer: Self-pay | Admitting: Sports Medicine

## 2017-12-12 ENCOUNTER — Ambulatory Visit (INDEPENDENT_AMBULATORY_CARE_PROVIDER_SITE_OTHER): Payer: Medicare Other | Admitting: Sports Medicine

## 2017-12-12 DIAGNOSIS — M47815 Spondylosis without myelopathy or radiculopathy, thoracolumbar region: Secondary | ICD-10-CM | POA: Diagnosis not present

## 2017-12-12 DIAGNOSIS — M170 Bilateral primary osteoarthritis of knee: Secondary | ICD-10-CM

## 2017-12-12 NOTE — Assessment & Plan Note (Signed)
Only temporary response to bilateral steroid injections, starting Orthovisc today. Return in 1 week for Orthovisc No. 2 of 4 both knees.

## 2017-12-12 NOTE — Assessment & Plan Note (Signed)
[name] [name] has widespread lumbar degenerative disc disease, multilevel facet arthritis. He does have bilateral foot numbness, this is likely a combination of peripheral neuropathy secondary to his multiple myeloma as well as from his lumbar spinal stenosis. We are going to proceed at this point with a left L4-L5 interlaminar epidural. He will probably have this done with Dr. Brien Few. Recently had facet medial branch blocks in anticipation of facet RFA.

## 2017-12-12 NOTE — Progress Notes (Signed)
Subjective:    CC: Knee pain, foot numbness  HPI: Knee osteoarthritis: Steroid injection at the last visit provided only temporary relief.  Foot numbness: With lumbar spinal stenosis on lumbar spine MRI from last year, he also had a nerve conduction study that did not show any evidence of peripheral neuropathy a few years ago.  No bowel or bladder dysfunction, saddle numbness, constitutional symptoms.  He is currently undergoing medial branch blocks in anticipation of facet radio frequency ablation with Dr. Brien Few.  I reviewed the past medical history, family history, social history, surgical history, and allergies today and no changes were needed.  Please see the problem list section below in epic for further details.  Past Medical History: Past Medical History:  Diagnosis Date  . Acute deep vein thrombosis (DVT) of tibial vein of right lower extremity ([place]) 09/29/2014  . Arthritis    back  . Asthma    uses Advair bid and asmanex prn  . Cancer ([place])    multiple myeloma  . Chronic back pain    buldging disc and scoliosis  . Chronic bronchitis ([place])    couple of yrs ago  . Complication of anesthesia    severe case of hiccups after hernia surgery;required medication  . Diverticulosis   . Dizziness    related to neck issues  . Emphysema   . Enlarged prostate    takes Flomax daily  . GERD (gastroesophageal reflux disease)    takes PRotonix daily  . History of colon polyps   . Hyperlipidemia    takes Zocor daily  . Hypertension    takes HCTZ,Diovan daily and Isosorbide daily  . Itching    down left arm and leg  . Joint pain   . Macular degeneration    mild,beginning  . Multiple myeloma ([place]) 08/13/2014  . Myocardial infarction ([place]) 2010  . Neck pain   . Pain in limb 06/20/2012  . Pneumonia    hx of---12+yrs ago   Past Surgical History: Past Surgical History:  Procedure Laterality Date  . ANTERIOR CERVICAL DECOMP/DISCECTOMY FUSION  01/31/2012   Procedure: ANTERIOR  CERVICAL DECOMPRESSION/DISCECTOMY FUSION 1 LEVEL;  Surgeon: Floyce Stakes, MD;  Location: [facility];  Service: Neurosurgery;  Laterality: N/A;  Cervical Five-Six  Anterior Cervical Decompression /Diskectomy /Fusion/Plate  . BONE MARROW TRANSPLANT    . CARDIAC CATHETERIZATION  2010/2013   06/07/11 [facility]) LAD 25%, D2 75% (small), pLCx 100%, RCA 25%--medical managment   . COLONOSCOPY    . HERNIA REPAIR  2006   x 3   . salivary gland removed     left   . skin cancer removed from left side of face     basal cell carcinoma  . vein removed from left leg     Social History: Social History   Socioeconomic History  . Marital status: Married    Spouse name: Not on file  . Number of children: 3  . Years of education: Not on file  . Highest education level: Not on file  Occupational History  . Not on file  Social Needs  . Financial resource strain: Not on file  . Food insecurity:    Worry: Not on file    Inability: Not on file  . Transportation needs:    Medical: Not on file    Non-medical: Not on file  Tobacco Use  . Smoking status: Former Smoker    Years: 30.00    Types: Pipe    Last attempt to quit:  11/24/2011    Years since quitting: 6.0  . Smokeless tobacco: Former Systems developer    Quit date: 02/15/2011  . Tobacco comment: quit tobacco 3 years ago  Substance and Sexual Activity  . Alcohol use: Yes    Alcohol/week: 0.0 standard drinks    Comment: 1-2 glasses of wine daily  . Drug use: No  . Sexual activity: Yes  Lifestyle  . Physical activity:    Days per week: Not on file    Minutes per session: Not on file  . Stress: Not on file  Relationships  . Social connections:    Talks on phone: Not on file    Gets together: Not on file    Attends religious service: Not on file    Active member of club or organization: Not on file    Attends meetings of clubs or organizations: Not on file    Relationship status: Not on file  Other Topics Concern  . Not on file  Social History  Narrative  . Not on file   Family History: Family History  Problem Relation Age of Onset  . Hypertension Mother   . Heart attack Father   . Cancer Sister   . Cancer Sister    Allergies: Allergies  Allergen Reactions  . Budesonide-Formoterol Fumarate Itching and Other (See Comments)  . Codeine Itching  . Hydrocodone-Acetaminophen Itching  . Mushroom Extract Complex Nausea And Vomiting and Other (See Comments)    Only shitake mushroom  causes this reaction. Flu-like symptoms from portabello mushrooms   Medications: See med rec.  Review of Systems: No fevers, chills, night sweats, weight loss, chest pain, or shortness of breath.   Objective:    General: Well Developed, well nourished, and in no acute distress.  Neuro: Alert and oriented x3, extra-ocular muscles intact, sensation grossly intact.  HEENT: Normocephalic, atraumatic, pupils equal round reactive to light, neck supple, no masses, no lymphadenopathy, thyroid nonpalpable.  Skin: Warm and dry, no rashes. Cardiac: Regular rate and rhythm, no murmurs rubs or gallops, no lower extremity edema.  Respiratory: Clear to auscultation bilaterally. Not using accessory muscles, speaking in full sentences. Bilateral knees: Tenderness at the medial joint line, left knee effusion. ROM normal in flexion and extension and lower leg rotation. Ligaments with solid consistent endpoints including ACL, PCL, LCL, MCL. Negative Mcmurray's and provocative meniscal tests. Non painful patellar compression. Patellar and quadriceps tendons unremarkable. Hamstring and quadriceps strength is normal.  Procedure: Real-time Ultrasound Guided aspiration/injection of left knee Device: GE Logiq E  Verbal informed consent obtained.  Time-out conducted.  Noted no overlying erythema, induration, or other signs of local infection.  Skin prepped in a sterile fashion.  Local anesthesia: Topical Ethyl chloride.  With sterile technique and under real time  ultrasound guidance: Using a 22-gauge needle advanced into the suprapatellar recess and aspirated 20 cc of clear, straw-colored fluid, syringe switched and 30 mg/2 mL of OrthoVisc (sodium hyaluronate) in a prefilled syringe was injected easily into the knee through a 22-gauge needle. Completed without difficulty  Pain immediately resolved suggesting accurate placement of the medication.  Advised to call if fevers/chills, erythema, induration, drainage, or persistent bleeding.  Images permanently stored and available for review in the ultrasound unit.  Impression: Technically successful ultrasound guided injection.  Procedure: Real-time Ultrasound Guided Injection of right knee Device: GE Logiq E  Verbal informed consent obtained.  Time-out conducted.  Noted no overlying erythema, induration, or other signs of local infection.  Skin prepped in a sterile fashion.  Local anesthesia: Topical Ethyl chloride.  With sterile technique and under real time ultrasound guidance: 30 mg/2 mL of OrthoVisc (sodium hyaluronate) in a prefilled syringe was injected easily into the knee through a 22-gauge needle. Completed without difficulty  Pain immediately resolved suggesting accurate placement of the medication.  Advised to call if fevers/chills, erythema, induration, drainage, or persistent bleeding.  Images permanently stored and available for review in the ultrasound unit.  Impression: Technically successful ultrasound guided injection.  Impression and Recommendations:    Primary osteoarthritis of both knees Only temporary response to bilateral steroid injections, starting Orthovisc today. Return in 1 week for Orthovisc No. 2 of 4 both knees.  Spondylosis of thoracolumbar region without myelopathy or radiculopathy Ferdie has widespread lumbar degenerative disc disease, multilevel facet arthritis. He does have bilateral foot numbness, this is likely a combination of peripheral neuropathy secondary to  his multiple myeloma as well as from his lumbar spinal stenosis. We are going to proceed at this point with a left L4-L5 interlaminar epidural. He will probably have this done with Dr. Brien Few. Recently had facet medial branch blocks in anticipation of facet RFA.  ___________________________________________ Gwen Her. Dianah Field, M.D., ABFM., CAQSM. Primary Care and Sports Medicine [facility]  Adjunct Professor of [place] [employer]

## 2017-12-15 ENCOUNTER — Telehealth: Payer: Self-pay

## 2017-12-15 NOTE — Telephone Encounter (Signed)
   [facility] Pre-operative Risk Assessment    Request for surgical clearance:  1. What type of surgery is being performed? Transforaminal Spinal Injection   2. When is this surgery scheduled? TBD   3. What type of clearance is required (medical clearance vs. Pharmacy clearance to hold med vs. Both)? Pharmacy   4. Are there any medications that need to be held prior to surgery and how long? Xarelto 3 days prior   5. Practice name and name of physician performing surgery?  [facility], Dr. Brien Few    6. What is your office phone number? [phone]    7.   What is your office fax number? [phone]  8.   Anesthesia type (None, local, MAC, general) ? Not specified    [name] [name] 12/15/2017, 2:55 PM  _________________________________________________________________   (provider comments below)

## 2017-12-19 ENCOUNTER — Other Ambulatory Visit: Payer: Self-pay | Admitting: Sports Medicine

## 2017-12-19 ENCOUNTER — Other Ambulatory Visit: Payer: Self-pay | Admitting: *Deleted

## 2017-12-19 ENCOUNTER — Ambulatory Visit (INDEPENDENT_AMBULATORY_CARE_PROVIDER_SITE_OTHER): Payer: Medicare Other | Admitting: Sports Medicine

## 2017-12-19 DIAGNOSIS — I82441 Acute embolism and thrombosis of right tibial vein: Secondary | ICD-10-CM

## 2017-12-19 DIAGNOSIS — M170 Bilateral primary osteoarthritis of knee: Secondary | ICD-10-CM

## 2017-12-19 MED ORDER — RIVAROXABAN 20 MG PO TABS: ORAL_TABLET | ORAL | 2 refills | Status: DC

## 2017-12-19 NOTE — Telephone Encounter (Signed)
Pt takes Xarelto for PE/DVT and is managed by Dr. Marin Olp, please contact managing provider for length of hold on DOAC medication.

## 2017-12-19 NOTE — Telephone Encounter (Signed)
   Primary Cardiologist:Brian Stanford Breed, MD  Chart reviewed as part of pre-operative protocol coverage. Because of [name] [name]'s past medical history and time since last visit, he/she will require a follow-up visit in order to better assess preoperative cardiovascular risk.  Pre-op covering staff: - Please schedule appointment and call patient to inform them. - Please contact requesting surgeon's office via preferred method (i.e, phone, fax) to inform them of need for appointment prior to surgery.  If applicable, this message will also be routed to pharmacy pool and/or primary cardiologist for input on holding anticoagulant/antiplatelet agent as requested below so that this information is available at time of patient's appointment.   [name], NP  12/19/2017, 3:28 PM

## 2017-12-19 NOTE — Assessment & Plan Note (Signed)
Orthovisc No. 2 of 4 today both knees. Return in 1 week for Orthovisc #3 of 4 both knees

## 2017-12-19 NOTE — Progress Notes (Signed)

## 2017-12-19 NOTE — Telephone Encounter (Signed)
Pt scheduled to see Kerin Ransom. PA on 12/20/17, Clearance will be addressed at visit. Requesting office has been notified.

## 2017-12-20 ENCOUNTER — Ambulatory Visit (INDEPENDENT_AMBULATORY_CARE_PROVIDER_SITE_OTHER): Payer: Medicare Other | Admitting: Cardiology

## 2017-12-20 ENCOUNTER — Encounter: Payer: Self-pay | Admitting: Cardiology

## 2017-12-20 VITALS — BP 116/62 | HR 55 | Resp 16 | Ht 72.0 in | Wt 219.0 lb

## 2017-12-20 DIAGNOSIS — Z86711 Personal history of pulmonary embolism: Secondary | ICD-10-CM

## 2017-12-20 DIAGNOSIS — Z01810 Encounter for preprocedural cardiovascular examination: Secondary | ICD-10-CM | POA: Diagnosis not present

## 2017-12-20 DIAGNOSIS — I2510 Atherosclerotic heart disease of native coronary artery without angina pectoris: Secondary | ICD-10-CM | POA: Diagnosis not present

## 2017-12-20 DIAGNOSIS — I10 Essential (primary) hypertension: Secondary | ICD-10-CM | POA: Diagnosis not present

## 2017-12-20 DIAGNOSIS — C9000 Multiple myeloma not having achieved remission: Secondary | ICD-10-CM | POA: Diagnosis not present

## 2017-12-20 DIAGNOSIS — Z9861 Coronary angioplasty status: Secondary | ICD-10-CM | POA: Diagnosis not present

## 2017-12-20 MED ORDER — RIVAROXABAN 10 MG PO TABS: ORAL_TABLET | ORAL | 0 refills | Status: DC

## 2017-12-20 NOTE — Progress Notes (Signed)
Hold xarelto 2 days prior to procedure and resume after when ok with surgery; would not bridge with lovenox Kirk Ruths

## 2017-12-20 NOTE — Progress Notes (Signed)
12/20/2017 [name] [name]   [birth year]  [id number]  Primary Physician Silverio Decamp, MD Primary Cardiologist: Dr Stanford Breed  HPI: [name] [name] is a 77 year old male with multiple medical issues who is followed by Dr. Stanford Breed.  He has a history of coronary disease, he had an RCA intervention with a drug-eluting stent in January 2016.  At that time he had chronic total occlusion of [name] circumflex.  He is been treated medically since and is done well.  This procedure was done at [name].  [name] [name] had a myocardial stress test in February 2018 that was low risk.  Echocardiogram in March 2018 showed normal LV function.  His last office visit with Dr. Stanford Breed was in May 2018.  [name] [name] is also followed by Dr. Burney Gauze for multiple myeloma.  He had bone marrow transplant in [name] past.  He has had recurrent pulmonary embolism in [name] past.  He has been on chronic Xarelto, this was decreased to 10 mg daily approximately a year ago according to [name] [name] by Dr. Marin Olp.  Other medical issues include COPD followed by Dr. Lamonte Sakai, treated hypertension, gastroesophageal reflux, and right bundle branch block.  [name] [name] was put on a statin this past year but developed elevated LFTs and this was discontinued.  Interestingly his LDL in November 2018 before starting statin therapy was 21.   [name] [name] has had some issues with back problems and radiculopathy.  He sees Dr. Brien Few.  He needs an epidural injection.  A request was sent from Dr. Lauris Poag office to hold [name] Xarelto for this procedure.  This triggered a cardiac evaluation as [name] [name] has not been seen in more than a year.  Since we saw [name] [name] last he has done well, he denies any unusual chest pain or dyspnea on exertion.  He does not use nitroglycerin.   Current Outpatient Medications  Medication Sig Dispense Refill  . albuterol (PROVENTIL HFA;VENTOLIN HFA) 108 (90 Base) MCG/ACT inhaler Inhale 2 puffs into [name] lungs every 6  (six) hours as needed for wheezing or shortness of breath. 1 Inhaler 3  . amLODipine (NORVASC) 5 MG tablet TAKE 1 TABLET EVERY DAY 90 tablet 3  . aspirin EC 81 MG tablet Take by mouth.    Marland Kitchen azelastine (OPTIVAR) 0.05 % ophthalmic solution Place 2 drops into both eyes 2 (two) times daily. 6 mL 11  . carvedilol (COREG) 6.25 MG tablet TAKE 1 TABLET TWICE DAILY WITH A MEAL 180 tablet 3  . cetirizine (ZYRTEC ALLERGY) 10 MG tablet Take 1 tablet (10 mg total) by mouth daily. 90 tablet 1  . Cyanocobalamin (RA VITAMIN B-12 TR) 1000 MCG TBCR Take 1 tablet by mouth daily.     Marland Kitchen esomeprazole (NEXIUM) 40 MG capsule Take 1 capsule (40 mg total) by mouth daily at 12 noon. 90 capsule 2  . famciclovir (FAMVIR) 500 MG tablet TAKE 1 TABLET EVERY DAY 90 tablet 3  . fluticasone (FLONASE) 50 MCG/ACT nasal spray Place 2 sprays into both nostrils daily. 48 g 1  . gabapentin (NEURONTIN) 600 MG tablet TAKE 1 TABLET EVERY MORNING  AND TAKE 2 TABLETS EVERY NIGHT 270 tablet 3  . hydrochlorothiazide (MICROZIDE) 12.5 MG capsule TAKE 1 CAPSULE EVERY DAY 90 capsule 0  . isosorbide mononitrate (IMDUR) 30 MG 24 hr tablet TAKE 1 TABLET TWICE DAILY 180 tablet 3  . LORazepam (ATIVAN) 1 MG tablet TAKE 1 TABLET AT BEDTIME 30 tablet 0  . nitroGLYCERIN (NITROSTAT) 0.4 MG SL tablet Place  0.4 mg under [name] tongue every 5 (five) minutes as needed. For chest pain    . oxyCODONE-acetaminophen (PERCOCET) 10-325 MG tablet TAKE 1 TABLET BY MOUTH ONCE DAILY AS NEEDED FOR PAIN 30 tablet 0  . potassium chloride SA (K-DUR,KLOR-CON) 20 MEQ tablet TAKE 1 TABLET TWICE DAILY 180 tablet 0  . pyridoxine (B-6) 100 MG tablet Take 100 mg by mouth daily.     . rivaroxaban (XARELTO) 10 MG TABS tablet TAKE 1 TABLET EVERY DAY WITH SUPPER 30 tablet 0   No current facility-administered medications for this visit.     Allergies  Allergen Reactions  . Budesonide-Formoterol Fumarate Itching and Other (See Comments)  . Codeine Itching  . Hydrocodone-Acetaminophen  Itching  . Mushroom Extract Complex Nausea And Vomiting and Other (See Comments)    Only shitake mushroom  causes this reaction. Flu-like symptoms from portabello mushrooms    Past Medical History:  Diagnosis Date  . Acute deep vein thrombosis (DVT) of tibial vein of right lower extremity ([place]) 09/29/2014  . Arthritis    back  . Asthma    uses Advair bid and asmanex prn  . Cancer ([place])    multiple myeloma  . Chronic back pain    buldging disc and scoliosis  . Chronic bronchitis ([place])    couple of yrs ago  . Complication of anesthesia    severe case of hiccups after hernia surgery;required medication  . Diverticulosis   . Dizziness    related to neck issues  . Emphysema   . Enlarged prostate    takes Flomax daily  . GERD (gastroesophageal reflux disease)    takes PRotonix daily  . History of colon polyps   . Hyperlipidemia    takes Zocor daily  . Hypertension    takes HCTZ,Diovan daily and Isosorbide daily  . Itching    down left arm and leg  . Joint pain   . Macular degeneration    mild,beginning  . Multiple myeloma ([place]) 08/13/2014  . Myocardial infarction ([place]) 2010  . Neck pain   . Pain in limb 06/20/2012  . Pneumonia    hx of---12+yrs ago    Social History   Socioeconomic History  . Marital status: Married    Spouse name: Not on file  . Number of children: 3  . Years of education: Not on file  . Highest education level: Not on file  Occupational History  . Not on file  Social Needs  . Financial resource strain: Not on file  . Food insecurity:    Worry: Not on file    Inability: Not on file  . Transportation needs:    Medical: Not on file    Non-medical: Not on file  Tobacco Use  . Smoking status: Former Smoker    Years: 30.00    Types: Pipe    Last attempt to quit: 11/24/2011    Years since quitting: 6.0  . Smokeless tobacco: Former Systems developer    Quit date: 02/15/2011  . Tobacco comment: quit tobacco 3 years ago  Substance and Sexual Activity  .  Alcohol use: Yes    Alcohol/week: 0.0 standard drinks    Comment: 1-2 glasses of wine daily  . Drug use: No  . Sexual activity: Yes  Lifestyle  . Physical activity:    Days per week: Not on file    Minutes per session: Not on file  . Stress: Not on file  Relationships  . Social connections:    Talks on phone:  Not on file    Gets together: Not on file    Attends religious service: Not on file    Active member of club or organization: Not on file    Attends meetings of clubs or organizations: Not on file    Relationship status: Not on file  . Intimate partner violence:    Fear of current or ex partner: Not on file    Emotionally abused: Not on file    Physically abused: Not on file    Forced sexual activity: Not on file  Other Topics Concern  . Not on file  Social History Narrative  . Not on file     Family History  Problem Relation Age of Onset  . Hypertension Mother   . Heart attack Father   . Cancer Sister   . Cancer Sister      Review of Systems: General: negative for chills, fever, night sweats or weight changes.  Cardiovascular: negative for chest pain, dyspnea on exertion, edema, orthopnea, palpitations, paroxysmal nocturnal dyspnea or shortness of breath Dermatological: negative for rash Respiratory: negative for cough or wheezing Urologic: negative for hematuria Abdominal: negative for nausea, vomiting, diarrhea, bright red blood per rectum, melena, or hematemesis Neurologic: negative for visual changes, syncope, or dizziness All other systems reviewed and are otherwise negative except as noted above.    Blood pressure 116/62, pulse (!) 55, resp. rate 16, height 6' (1.829 m), weight 219 lb (99.3 kg), SpO2 96 %.  General appearance: alert, cooperative, no distress and mildly obese Neck: no carotid bruit and no JVD Lungs: few rhonchi Rt base Heart: regular rate and rhythm Extremities: no edema Skin: pale, warm, dry Neurologic: Grossly normal  EKG NSR,  51, RBBB  ASSESSMENT AND PLAN:   Pre-operative cardiovascular examination Pt seen for pre op cardiac clearance prior to epidural injection.  CAD S/P percutaneous coronary angioplasty RCA PCI/ DES and CTO of CFX noted- Jan 2016 at [facility]  History of pulmonary embolism Pt has had recurrent PE/DVT. He is currently on Xarelto 10 mg followed by Dr Marin Olp  Multiple myeloma Followed by Dr Marin Olp, history of bone marrow transplant 2017  Essential hypertension, benign Controlled   PLAN  From a cardiac standpoint Mr [name] is an acceptable risk for epidural injection.  I will ask Dr Marin Olp about holding Xarelto for this and include his recommendations in my clearance note to Dr Brien Few.   Kerin Ransom PA-C 12/20/2017 12:21 PM

## 2017-12-20 NOTE — Patient Instructions (Signed)
Medication Instructions:  Your physician recommends that you continue on your current medications as directed. Please refer to the Current Medication list given to you today. If you need a refill on your cardiac medications before your next appointment, please call your pharmacy.   Lab work: None  If you have labs (blood work) drawn today and your tests are completely normal, you will receive your results only by: . MyChart Message (if you have MyChart) OR . A paper copy in the mail If you have any lab test that is abnormal or we need to change your treatment, we will call you to review the results.  Testing/Procedures: None   Follow-Up: At [facility], you and your health needs are our priority.  As part of our continuing mission to provide you with exceptional heart care, we have created designated Provider Care Teams.  These Care Teams include your primary Cardiologist (physician) and Advanced Practice Providers (APPs -  Physician Assistants and Nurse Practitioners) who all work together to provide you with the care you need, when you need it. You will need a follow up appointment in 12 months.  Please call our office 2 months in advance to schedule this appointment.  You may see Brian Crenshaw, MD or one of the following Advanced Practice Providers on your designated Care Team:   Luke Kilroy, PA-C Krista Kroeger, PA-C . Callie Goodrich, PA-C  Any Other Special Instructions Will Be Listed Below (If Applicable).   

## 2017-12-20 NOTE — Assessment & Plan Note (Signed)
Followed by Dr Marin Olp, history of bone marrow transplant 2017

## 2017-12-20 NOTE — Assessment & Plan Note (Signed)
Controlled.  

## 2017-12-20 NOTE — Assessment & Plan Note (Signed)
Pt has had recurrent PE/DVT. He is currently on Xarelto 10 mg followed by Dr Marin Olp

## 2017-12-20 NOTE — Assessment & Plan Note (Signed)
Pt seen for pre op cardiac clearance prior to epidural injection.

## 2017-12-20 NOTE — Assessment & Plan Note (Signed)
RCA PCI/ DES and CTO of CFX noted- Jan 2016 at [facility]

## 2017-12-21 ENCOUNTER — Other Ambulatory Visit: Payer: Self-pay | Admitting: *Deleted

## 2017-12-21 DIAGNOSIS — I2510 Atherosclerotic heart disease of native coronary artery without angina pectoris: Secondary | ICD-10-CM

## 2017-12-21 DIAGNOSIS — Z01810 Encounter for preprocedural cardiovascular examination: Secondary | ICD-10-CM

## 2017-12-21 DIAGNOSIS — Z9861 Coronary angioplasty status: Secondary | ICD-10-CM

## 2017-12-21 MED ORDER — RIVAROXABAN 10 MG PO TABS: ORAL_TABLET | ORAL | 3 refills | Status: DC

## 2017-12-26 ENCOUNTER — Ambulatory Visit (INDEPENDENT_AMBULATORY_CARE_PROVIDER_SITE_OTHER): Payer: Medicare Other | Admitting: Sports Medicine

## 2017-12-26 DIAGNOSIS — M170 Bilateral primary osteoarthritis of knee: Secondary | ICD-10-CM | POA: Diagnosis not present

## 2017-12-26 NOTE — Progress Notes (Signed)

## 2017-12-26 NOTE — Assessment & Plan Note (Addendum)
Orthovisc No. 3 of 4 into both knees, return in 1 week for #4 of 4 both knees

## 2017-12-27 ENCOUNTER — Other Ambulatory Visit: Payer: Self-pay | Admitting: *Deleted

## 2017-12-27 DIAGNOSIS — I2699 Other pulmonary embolism without acute cor pulmonale: Secondary | ICD-10-CM

## 2017-12-27 DIAGNOSIS — Z9861 Coronary angioplasty status: Secondary | ICD-10-CM

## 2017-12-27 DIAGNOSIS — I2510 Atherosclerotic heart disease of native coronary artery without angina pectoris: Secondary | ICD-10-CM

## 2017-12-27 DIAGNOSIS — Z01810 Encounter for preprocedural cardiovascular examination: Secondary | ICD-10-CM

## 2017-12-27 MED ORDER — RIVAROXABAN 10 MG PO TABS: 10.0000 mg | ORAL_TABLET | Freq: Every day | ORAL | 3 refills | Status: DC

## 2017-12-28 ENCOUNTER — Telehealth: Payer: Self-pay | Admitting: Cardiology

## 2017-12-28 NOTE — Telephone Encounter (Signed)
Luke I see you cleared but did you find out about xarelto?  Thanks. Mickel Baas

## 2017-12-28 NOTE — Telephone Encounter (Signed)
   [facility] Pre-operative Risk Assessment    Request for surgical clearance:  1. What type of surgery is being performed? Spinal injection   2. When is this surgery scheduled? TBD   3. What type of clearance is required (medical clearance vs. Pharmacy clearance to hold med vs. Both)? Both   4. Are there any medications that need to be held prior to surgery and how long? Xarelto - 3 days prior   5. Practice name and name of physician performing surgery? Dr. Marlaine Hind with [facility]    6. What is your office phone number: unknown    7.   What is your office fax number: [phone]  8.   Anesthesia type (None, local, MAC, general) ? None    Sheral Apley M 12/28/2017, 2:10 PM  _________________________________________________________________   (provider comments below)

## 2017-12-29 ENCOUNTER — Other Ambulatory Visit: Payer: Self-pay

## 2017-12-29 DIAGNOSIS — M9903 Segmental and somatic dysfunction of lumbar region: Secondary | ICD-10-CM | POA: Diagnosis not present

## 2017-12-29 DIAGNOSIS — M9905 Segmental and somatic dysfunction of pelvic region: Secondary | ICD-10-CM | POA: Diagnosis not present

## 2017-12-29 DIAGNOSIS — M9901 Segmental and somatic dysfunction of cervical region: Secondary | ICD-10-CM | POA: Diagnosis not present

## 2017-12-29 DIAGNOSIS — M5431 Sciatica, right side: Secondary | ICD-10-CM | POA: Diagnosis not present

## 2017-12-29 NOTE — Telephone Encounter (Signed)
Yes both Dr Stanford Breed and Dr Marin Olp said hold 48 hours pre op.  Kerin Ransom PA-C 12/29/2017 12:51 PM

## 2017-12-29 NOTE — Telephone Encounter (Signed)
Ok to hold Xarelto 48 hours prior to procedure.  Per Dr. Stanford Breed and Dr. Marin Olp.

## 2018-01-02 ENCOUNTER — Other Ambulatory Visit: Payer: Self-pay | Admitting: Sports Medicine

## 2018-01-02 ENCOUNTER — Ambulatory Visit (INDEPENDENT_AMBULATORY_CARE_PROVIDER_SITE_OTHER): Payer: Medicare Other | Admitting: Sports Medicine

## 2018-01-02 DIAGNOSIS — M47815 Spondylosis without myelopathy or radiculopathy, thoracolumbar region: Secondary | ICD-10-CM

## 2018-01-02 DIAGNOSIS — M170 Bilateral primary osteoarthritis of knee: Secondary | ICD-10-CM | POA: Diagnosis not present

## 2018-01-02 MED ORDER — OXYCODONE-ACETAMINOPHEN 10-325 MG PO TABS: 1.0000 | ORAL_TABLET | Freq: Three times a day (TID) | ORAL | 0 refills | Status: DC | PRN

## 2018-01-02 NOTE — Assessment & Plan Note (Addendum)
Orthovisc injection #4 of 4 into both knees, he is having some relief, approximately 60 to 70%. Refilling pain medication. Next step would be Coolief radio frequency ablation.

## 2018-01-02 NOTE — Progress Notes (Signed)

## 2018-01-02 NOTE — Addendum Note (Signed)
Addended by: Silverio Decamp on: 01/02/2018 11:17 AM   Modules accepted: Orders

## 2018-01-05 ENCOUNTER — Inpatient Hospital Stay: Payer: Medicare Other

## 2018-01-05 ENCOUNTER — Other Ambulatory Visit: Payer: Self-pay

## 2018-01-05 ENCOUNTER — Inpatient Hospital Stay: Payer: Medicare Other | Attending: Hematology & Oncology | Admitting: Hematology & Oncology

## 2018-01-05 ENCOUNTER — Encounter: Payer: Self-pay | Admitting: Sports Medicine

## 2018-01-05 ENCOUNTER — Encounter: Payer: Self-pay | Admitting: Hematology & Oncology

## 2018-01-05 VITALS — BP 110/52 | HR 57 | Temp 97.8°F | Resp 18 | Wt 220.8 lb

## 2018-01-05 DIAGNOSIS — E039 Hypothyroidism, unspecified: Secondary | ICD-10-CM | POA: Diagnosis not present

## 2018-01-05 DIAGNOSIS — D500 Iron deficiency anemia secondary to blood loss (chronic): Secondary | ICD-10-CM

## 2018-01-05 DIAGNOSIS — C9000 Multiple myeloma not having achieved remission: Secondary | ICD-10-CM | POA: Diagnosis not present

## 2018-01-05 DIAGNOSIS — M899 Disorder of bone, unspecified: Secondary | ICD-10-CM

## 2018-01-05 DIAGNOSIS — C9001 Multiple myeloma in remission: Secondary | ICD-10-CM

## 2018-01-05 DIAGNOSIS — R5383 Other fatigue: Secondary | ICD-10-CM

## 2018-01-05 LAB — CMP (CANCER CENTER ONLY)
ALBUMIN: 3.4 g/dL — AB (ref 3.5–5.0)
ALT: 25 U/L (ref 10–47)
AST: 32 U/L (ref 11–38)
Alkaline Phosphatase: 90 U/L — ABNORMAL HIGH (ref 26–84)
Anion gap: 6 (ref 5–15)
BUN: 14 mg/dL (ref 7–22)
CO2: 29 mmol/L (ref 18–33)
CREATININE: 1.5 mg/dL — AB (ref 0.60–1.20)
Calcium: 8.9 mg/dL (ref 8.0–10.3)
Chloride: 106 mmol/L (ref 98–108)
Glucose, Bld: 147 mg/dL — ABNORMAL HIGH (ref 73–118)
Potassium: 3.4 mmol/L (ref 3.3–4.7)
SODIUM: 141 mmol/L (ref 128–145)
Total Bilirubin: 0.8 mg/dL (ref 0.2–1.6)
Total Protein: 6.4 g/dL (ref 6.4–8.1)

## 2018-01-05 LAB — CBC WITH DIFFERENTIAL (CANCER CENTER ONLY)
Abs Immature Granulocytes: 0.01 10*3/uL (ref 0.00–0.07)
Basophils Absolute: 0 10*3/uL (ref 0.0–0.1)
Basophils Relative: 1 %
EOS PCT: 1 %
Eosinophils Absolute: 0.1 10*3/uL (ref 0.0–0.5)
HEMATOCRIT: 37 % — AB (ref 39.0–52.0)
HEMOGLOBIN: 11.8 g/dL — AB (ref 13.0–17.0)
Immature Granulocytes: 0 %
LYMPHS ABS: 1.4 10*3/uL (ref 0.7–4.0)
Lymphocytes Relative: 31 %
MCH: 31.5 pg (ref 26.0–34.0)
MCHC: 31.9 g/dL (ref 30.0–36.0)
MCV: 98.7 fL (ref 80.0–100.0)
MONO ABS: 0.5 10*3/uL (ref 0.1–1.0)
MONOS PCT: 11 %
Neutro Abs: 2.5 10*3/uL (ref 1.7–7.7)
Neutrophils Relative %: 56 %
Platelet Count: 155 10*3/uL (ref 150–400)
RBC: 3.75 MIL/uL — ABNORMAL LOW (ref 4.22–5.81)
RDW: 14.5 % (ref 11.5–15.5)
WBC Count: 4.5 10*3/uL (ref 4.0–10.5)
nRBC: 0 % (ref 0.0–0.2)

## 2018-01-05 LAB — RETICULOCYTES
Immature Retic Fract: 16.6 % — ABNORMAL HIGH (ref 2.3–15.9)
RBC.: 3.75 MIL/uL — AB (ref 4.22–5.81)
RETIC COUNT ABSOLUTE: 54.8 10*3/uL (ref 19.0–186.0)
Retic Ct Pct: 1.5 % (ref 0.4–3.1)

## 2018-01-05 MED ORDER — ZOLEDRONIC ACID 4 MG/100ML IV SOLN
4.0000 mg | Freq: Once | INTRAVENOUS | Status: AC
  Administered 2018-01-05: 4 mg via INTRAVENOUS
  Filled 2018-01-05: qty 100

## 2018-01-05 MED ORDER — SODIUM CHLORIDE 0.9 % IV SOLN
Freq: Once | INTRAVENOUS | Status: AC
  Administered 2018-01-05: 12:00:00 via INTRAVENOUS
  Filled 2018-01-05: qty 250

## 2018-01-05 NOTE — Patient Instructions (Signed)

## 2018-01-05 NOTE — Progress Notes (Signed)
Hematology and Oncology Follow Up Visit  [name] [name] [id number] Jan 25, 1941 77 y.o. 01/05/2018   Principle Diagnosis:  IgG kappa myeloma - Trisomy 11 by FISH Acute thromboembolism of the right lower leg  Current Therapy:   Revlimid 10mg  po q day (21/28) -- d/c on 09/15/2017 Zometa 4 mg IV every 3 months  - next dose in - 03/2018 Xarelto 10 mg by mouth daily  Past Therapy:  Patient s/p cycle 5 of Velcade/Revlimid/Decadron S/P ASCT at [place] on 04/02/2015   Interim History:  [name] [name] is here today for follow-up.  So far, he is doing relatively well.  The main problem now is his joints.  He is having some back issues.  He is having knee issues.  It sounds like he may need to have knee surgery.  He is having some knee injections with hyaluronic acid.  He is post to undergo an epidural injection.  Has some neuropathy in his feet.  Apparently, there is some confusion my amongst his doctors as to when to stop the Xarelto.  I think that Xarelto can be stopped 2 days prior to the epidural.  As far as his myeloma is concerned, there is no monoclonal spike in his blood we will recheck him back in October.  His IgG level was 900 mg/dL.  His kappa light chain was 0.8 mg/dL.  He is doing well with his cholesterol medicine.  His liver tests have normalized.  He is not as dizzy.  His wife is doing much better now that she had her knee surgery.  It sounds like they will have a quiet Thanksgiving and Christmas.  Overall, his performance status is ECOG 1. Medications:  Allergies as of 01/05/2018      Reactions   Budesonide-formoterol Fumarate Itching, Other (See Comments)   Codeine Itching   Hydrocodone-acetaminophen Itching   Mushroom Extract Complex Nausea And Vomiting, Other (See Comments)   Only shitake mushroom  causes this reaction. Flu-like symptoms from portabello mushrooms      Medication List        Accurate as of 01/05/18 11:17 AM. Always use your most recent med list.            albuterol 108 (90 Base) MCG/ACT inhaler Commonly known as:  PROVENTIL HFA;VENTOLIN HFA Inhale 2 puffs into the lungs every 6 (six) hours as needed for wheezing or shortness of breath.   amLODipine 5 MG tablet Commonly known as:  NORVASC TAKE 1 TABLET EVERY DAY   aspirin EC 81 MG tablet Take by mouth.   azelastine 0.05 % ophthalmic solution Commonly known as:  OPTIVAR Place 2 drops into both eyes 2 (two) times daily.   carvedilol 6.25 MG tablet Commonly known as:  COREG TAKE 1 TABLET TWICE DAILY WITH A MEAL   cetirizine 10 MG tablet Commonly known as:  ZYRTEC Take 1 tablet (10 mg total) by mouth daily.   esomeprazole 40 MG capsule Commonly known as:  NEXIUM Take 1 capsule (40 mg total) by mouth daily at 12 noon.   famciclovir 500 MG tablet Commonly known as:  FAMVIR TAKE 1 TABLET EVERY DAY   fluticasone 50 MCG/ACT nasal spray Commonly known as:  FLONASE Place 2 sprays into both nostrils daily.   gabapentin 600 MG tablet Commonly known as:  NEURONTIN TAKE 1 TABLET EVERY MORNING  AND TAKE 2 TABLETS EVERY NIGHT   hydrochlorothiazide 12.5 MG capsule Commonly known as:  MICROZIDE TAKE 1 CAPSULE EVERY DAY   isosorbide mononitrate 30 MG 24  hr tablet Commonly known as:  IMDUR TAKE 1 TABLET TWICE DAILY   LORazepam 1 MG tablet Commonly known as:  ATIVAN TAKE 1 TABLET AT BEDTIME   nitroGLYCERIN 0.4 MG SL tablet Commonly known as:  NITROSTAT Place 0.4 mg under the tongue every 5 (five) minutes as needed. For chest pain   oxyCODONE-acetaminophen 10-325 MG tablet Commonly known as:  PERCOCET TAKE 1 TABLET BY MOUTH EVERY 8 HOURS AS NEEDED FOR PAIN   potassium chloride SA 20 MEQ tablet Commonly known as:  K-DUR,KLOR-CON TAKE 1 TABLET TWICE DAILY   pyridoxine 100 MG tablet Commonly known as:  B-6 Take 100 mg by mouth daily.   RA VITAMIN B-12 TR 1000 MCG Tbcr Generic drug:  Cyanocobalamin Take 1 tablet by mouth daily.   rivaroxaban 10 MG Tabs  tablet Commonly known as:  XARELTO Take 1 tablet (10 mg total) by mouth daily with supper.       Allergies:  Allergies  Allergen Reactions  . Budesonide-Formoterol Fumarate Itching and Other (See Comments)  . Codeine Itching  . Hydrocodone-Acetaminophen Itching  . Mushroom Extract Complex Nausea And Vomiting and Other (See Comments)    Only shitake mushroom  causes this reaction. Flu-like symptoms from portabello mushrooms    Past Medical History, Surgical history, Social history, and Family History were reviewed and updated.  Review of Systems: Review of Systems  Respiratory: Positive for cough.   Musculoskeletal: Positive for back pain.  All other systems reviewed and are negative.    Physical Exam:  weight is 220 lb 12 oz (100.1 kg). His oral temperature is 97.8 F (36.6 C). His blood pressure is 110/52 (abnormal) and his pulse is 57 (abnormal). His respiration is 18 and oxygen saturation is 100%.   Wt Readings from Last 3 Encounters:  01/05/18 220 lb 12 oz (100.1 kg)  12/20/17 219 lb (99.3 kg)  12/11/17 218 lb (98.9 kg)    Physical Exam  Constitutional: He is oriented to person, place, and time.  HENT:  Head: Normocephalic and atraumatic.  Mouth/Throat: Oropharynx is clear and moist.  Eyes: Pupils are equal, round, and reactive to light. EOM are normal.  Neck: Normal range of motion.  Cardiovascular: Normal rate, regular rhythm and normal heart sounds.  Pulmonary/Chest: Effort normal and breath sounds normal.  Abdominal: Soft. Bowel sounds are normal.  Musculoskeletal: Normal range of motion. He exhibits no edema, tenderness or deformity.  Lymphadenopathy:    He has no cervical adenopathy.  Neurological: He is alert and oriented to person, place, and time.  Skin: Skin is warm and dry. No rash noted. No erythema.  Psychiatric: He has a normal mood and affect. His behavior is normal. Judgment and thought content normal.  Vitals reviewed.    Lab Results   Component Value Date   WBC 4.5 01/05/2018   HGB 11.8 (L) 01/05/2018   HCT 37.0 (L) 01/05/2018   MCV 98.7 01/05/2018   PLT 155 01/05/2018   Lab Results  Component Value Date   FERRITIN 53 02/19/2013   IRON 45 02/19/2013   TIBC 320 02/19/2013   UIBC 275 02/19/2013   IRONPCTSAT 14 (L) 02/19/2013   Lab Results  Component Value Date   RETICCTPCT 1.5 01/05/2018   RBC 3.75 (L) 01/05/2018   RBC 3.75 (L) 01/05/2018   Lab Results  Component Value Date   KPAFRELGTCHN 8.3 11/24/2017   LAMBDASER 9.2 11/24/2017   KAPLAMBRATIO 0.90 11/24/2017   Lab Results  Component Value Date   IGGSERUM 900 11/24/2017  IGA 54 (L) 11/24/2017   IGMSERUM 30 11/24/2017   Lab Results  Component Value Date   TOTALPROTELP 5.9 (L) 11/24/2017   ALBUMINELP 3.4 11/24/2017   A1GS 0.1 11/24/2017   A2GS 0.7 11/24/2017   BETS 0.8 11/24/2017   BETA2SER 0.3 02/11/2015   GAMS 0.9 11/24/2017   MSPIKE Not Observed 11/24/2017   SPEI Comment 03/14/2017     Chemistry      Component Value Date/Time   NA 141 01/05/2018 1010   NA 140 01/27/2017 1044   NA 141 06/05/2015 1036   K 3.4 01/05/2018 1010   K 3.3 01/27/2017 1044   K 2.9 (LL) 06/05/2015 1036   CL 106 01/05/2018 1010   CL 101 01/27/2017 1044   CO2 29 01/05/2018 1010   CO2 28 01/27/2017 1044   CO2 27 06/05/2015 1036   BUN 14 01/05/2018 1010   BUN 14 01/27/2017 1044   BUN 13.1 06/05/2015 1036   CREATININE 1.50 (H) 01/05/2018 1010   CREATININE 1.59 (H) 10/06/2017 1215   CREATININE 1.0 06/05/2015 1036   GLU 152 03/30/2016      Component Value Date/Time   CALCIUM 8.9 01/05/2018 1010   CALCIUM 8.9 01/27/2017 1044   CALCIUM 9.4 06/05/2015 1036   ALKPHOS 90 (H) 01/05/2018 1010   ALKPHOS 126 (H) 01/27/2017 1044   ALKPHOS 94 06/05/2015 1036   AST 32 01/05/2018 1010   AST 22 06/05/2015 1036   ALT 25 01/05/2018 1010   ALT 33 01/27/2017 1044   ALT 18 06/05/2015 1036   BILITOT 0.8 01/05/2018 1010   BILITOT 0.59 06/05/2015 1036      Impression  and Plan: Mr. Ingham is a very pleasant 78 yo caucasian gentleman with history of IgG kappa myeloma and ASCT at [facility] in February 2017.   I am happy that his myeloma has done so well this year.  He has been off the Revlimid now for 3 months.  Hopefully, we will continue to do find that his monoclonal spike does not reappear.  I will plan to get him back now after the holidays.  We will get him back in January.  From my perspective, I do not see any problems with him having invasive procedures as long as as he is off the Xarelto for at least 2 days.   [name], MD 11/22/201911:17 AM

## 2018-01-05 NOTE — Progress Notes (Signed)
Samples given: Xarelto 10 mg    4 bottles x 7 tablets each Lot:  [id number]     Exp:  11/20

## 2018-01-08 ENCOUNTER — Encounter: Payer: Self-pay | Admitting: Sports Medicine

## 2018-01-08 ENCOUNTER — Other Ambulatory Visit: Payer: Self-pay | Admitting: Hematology & Oncology

## 2018-01-08 DIAGNOSIS — E039 Hypothyroidism, unspecified: Secondary | ICD-10-CM | POA: Insufficient documentation

## 2018-01-08 LAB — IRON AND TIBC
IRON: 64 ug/dL (ref 42–163)
Saturation Ratios: 17 % — ABNORMAL LOW (ref 20–55)
TIBC: 389 ug/dL (ref 202–409)
UIBC: 325 ug/dL (ref 117–376)

## 2018-01-08 LAB — FERRITIN: Ferritin: 13 ng/mL — ABNORMAL LOW (ref 24–336)

## 2018-01-08 LAB — TSH: TSH: 70.222 u[IU]/mL — ABNORMAL HIGH (ref 0.320–4.118)

## 2018-01-08 MED ORDER — LEVOTHYROXINE SODIUM 50 MCG PO TABS: 50.0000 ug | ORAL_TABLET | Freq: Every day | ORAL | 2 refills | Status: DC

## 2018-01-08 MED ORDER — FUSION PLUS PO CAPS: 1.0000 | ORAL_CAPSULE | Freq: Every day | ORAL | 12 refills | Status: DC

## 2018-01-08 NOTE — Progress Notes (Unsigned)
Synthroid 

## 2018-01-09 LAB — MULTIPLE MYELOMA PANEL, SERUM
ALBUMIN SERPL ELPH-MCNC: 3.7 g/dL (ref 2.9–4.4)
Albumin/Glob SerPl: 1.7 (ref 0.7–1.7)
Alpha 1: 0.1 g/dL (ref 0.0–0.4)
Alpha2 Glob SerPl Elph-Mcnc: 0.6 g/dL (ref 0.4–1.0)
B-Globulin SerPl Elph-Mcnc: 0.8 g/dL (ref 0.7–1.3)
GAMMA GLOB SERPL ELPH-MCNC: 0.7 g/dL (ref 0.4–1.8)
GLOBULIN, TOTAL: 2.2 g/dL (ref 2.2–3.9)
IGA: 52 mg/dL — AB (ref 61–437)
IgG (Immunoglobin G), Serum: 820 mg/dL (ref 700–1600)
IgM (Immunoglobulin M), Srm: 32 mg/dL (ref 15–143)
Total Protein ELP: 5.9 g/dL — ABNORMAL LOW (ref 6.0–8.5)

## 2018-01-14 ENCOUNTER — Encounter: Payer: Self-pay | Admitting: Sports Medicine

## 2018-01-17 DIAGNOSIS — Z6829 Body mass index (BMI) 29.0-29.9, adult: Secondary | ICD-10-CM | POA: Diagnosis not present

## 2018-01-17 DIAGNOSIS — M48062 Spinal stenosis, lumbar region with neurogenic claudication: Secondary | ICD-10-CM | POA: Diagnosis not present

## 2018-01-17 DIAGNOSIS — I10 Essential (primary) hypertension: Secondary | ICD-10-CM | POA: Diagnosis not present

## 2018-01-18 ENCOUNTER — Ambulatory Visit: Payer: Medicare Other | Admitting: Sports Medicine

## 2018-01-23 ENCOUNTER — Other Ambulatory Visit: Payer: Self-pay | Admitting: Sports Medicine

## 2018-01-23 ENCOUNTER — Other Ambulatory Visit: Payer: Self-pay | Admitting: Family

## 2018-01-23 ENCOUNTER — Other Ambulatory Visit: Payer: Self-pay | Admitting: Hematology & Oncology

## 2018-01-26 DIAGNOSIS — M9905 Segmental and somatic dysfunction of pelvic region: Secondary | ICD-10-CM | POA: Diagnosis not present

## 2018-01-26 DIAGNOSIS — M9903 Segmental and somatic dysfunction of lumbar region: Secondary | ICD-10-CM | POA: Diagnosis not present

## 2018-01-26 DIAGNOSIS — M5431 Sciatica, right side: Secondary | ICD-10-CM | POA: Diagnosis not present

## 2018-01-26 DIAGNOSIS — M9901 Segmental and somatic dysfunction of cervical region: Secondary | ICD-10-CM | POA: Diagnosis not present

## 2018-01-30 ENCOUNTER — Ambulatory Visit (INDEPENDENT_AMBULATORY_CARE_PROVIDER_SITE_OTHER): Payer: Medicare Other | Admitting: Sports Medicine

## 2018-01-30 ENCOUNTER — Encounter: Payer: Self-pay | Admitting: Sports Medicine

## 2018-01-30 VITALS — BP 102/62 | HR 59 | Ht 72.0 in | Wt 217.0 lb

## 2018-01-30 DIAGNOSIS — E039 Hypothyroidism, unspecified: Secondary | ICD-10-CM

## 2018-01-30 DIAGNOSIS — Z9861 Coronary angioplasty status: Secondary | ICD-10-CM | POA: Diagnosis not present

## 2018-01-30 DIAGNOSIS — I2510 Atherosclerotic heart disease of native coronary artery without angina pectoris: Secondary | ICD-10-CM | POA: Diagnosis not present

## 2018-01-30 DIAGNOSIS — M47815 Spondylosis without myelopathy or radiculopathy, thoracolumbar region: Secondary | ICD-10-CM

## 2018-01-30 DIAGNOSIS — D500 Iron deficiency anemia secondary to blood loss (chronic): Secondary | ICD-10-CM | POA: Diagnosis not present

## 2018-01-30 DIAGNOSIS — M170 Bilateral primary osteoarthritis of knee: Secondary | ICD-10-CM

## 2018-01-30 DIAGNOSIS — E538 Deficiency of other specified B group vitamins: Secondary | ICD-10-CM | POA: Diagnosis not present

## 2018-01-30 MED ORDER — OXYCODONE-ACETAMINOPHEN 10-325 MG PO TABS: 1.0000 | ORAL_TABLET | Freq: Three times a day (TID) | ORAL | 0 refills | Status: DC | PRN

## 2018-01-30 NOTE — Assessment & Plan Note (Signed)
Rechecking iron indices 

## 2018-01-30 NOTE — Progress Notes (Addendum)
Subjective:    CC: Follow-up  HPI: Osteoarthritis of the knees: Failed everything, agreeable to proceed with arthroplasty.  Anemia: Due to recheck iron indices.  Hypothyroidism: Recently diagnosed, due to recheck TSH.  I reviewed the past medical history, family history, social history, surgical history, and allergies today and no changes were needed.  Please see the problem list section below in epic for further details.  Past Medical History: Past Medical History:  Diagnosis Date  . Acute deep vein thrombosis (DVT) of tibial vein of right lower extremity ([place]) 09/29/2014  . Arthritis    back  . Asthma    uses Advair bid and asmanex prn  . Cancer ([place])    multiple myeloma  . Chronic back pain    buldging disc and scoliosis  . Chronic bronchitis ([place])    couple of yrs ago  . Complication of anesthesia    severe case of hiccups after hernia surgery;required medication  . Diverticulosis   . Dizziness    related to neck issues  . Emphysema   . Enlarged prostate    takes Flomax daily  . GERD (gastroesophageal reflux disease)    takes PRotonix daily  . History of colon polyps   . Hyperlipidemia    takes Zocor daily  . Hypertension    takes HCTZ,Diovan daily and Isosorbide daily  . Itching    down left arm and leg  . Joint pain   . Macular degeneration    mild,beginning  . Multiple myeloma ([place]) 08/13/2014  . Myocardial infarction ([place]) 2010  . Neck pain   . Pain in limb 06/20/2012  . Pneumonia    hx of---12+yrs ago   Past Surgical History: Past Surgical History:  Procedure Laterality Date  . ANTERIOR CERVICAL DECOMP/DISCECTOMY FUSION  01/31/2012   Procedure: ANTERIOR CERVICAL DECOMPRESSION/DISCECTOMY FUSION 1 LEVEL;  Surgeon: Floyce Stakes, MD;  Location: [facility];  Service: Neurosurgery;  Laterality: N/A;  Cervical Five-Six  Anterior Cervical Decompression /Diskectomy /Fusion/Plate  . BONE MARROW TRANSPLANT    . CARDIAC CATHETERIZATION  2010/2013   06/07/11 [facility]) LAD 25%, D2 75% (small), pLCx 100%, RCA 25%--medical managment   . COLONOSCOPY    . HERNIA REPAIR  2006   x 3   . salivary gland removed     left   . skin cancer removed from left side of face     basal cell carcinoma  . vein removed from left leg     Social History: Social History   Socioeconomic History  . Marital status: Married    Spouse name: Not on file  . Number of children: 3  . Years of education: Not on file  . Highest education level: Not on file  Occupational History  . Not on file  Social Needs  . Financial resource strain: Not on file  . Food insecurity:    Worry: Not on file    Inability: Not on file  . Transportation needs:    Medical: Not on file    Non-medical: Not on file  Tobacco Use  . Smoking status: Former Smoker    Years: 30.00    Types: Pipe    Last attempt to quit: 11/24/2011    Years since quitting: 6.2  . Smokeless tobacco: Former Systems developer    Quit date: 02/15/2011  . Tobacco comment: quit tobacco 3 years ago  Substance and Sexual Activity  . Alcohol use: Yes    Alcohol/week: 0.0 standard drinks    Comment: 1-2 glasses  of wine daily  . Drug use: No  . Sexual activity: Yes  Lifestyle  . Physical activity:    Days per week: Not on file    Minutes per session: Not on file  . Stress: Not on file  Relationships  . Social connections:    Talks on phone: Not on file    Gets together: Not on file    Attends religious service: Not on file    Active member of club or organization: Not on file    Attends meetings of clubs or organizations: Not on file    Relationship status: Not on file  Other Topics Concern  . Not on file  Social History Narrative  . Not on file   Family History: Family History  Problem Relation Age of Onset  . Hypertension Mother   . Heart attack Father   . Cancer Sister   . Cancer Sister    Allergies: Allergies  Allergen Reactions  . Budesonide-Formoterol Fumarate Itching and Other (See Comments)    . Codeine Itching  . Hydrocodone-Acetaminophen Itching  . Mushroom Extract Complex Nausea And Vomiting and Other (See Comments)    Only shitake mushroom  causes this reaction. Flu-like symptoms from portabello mushrooms   Medications: See med rec.  Review of Systems: No fevers, chills, night sweats, weight loss, chest pain, or shortness of breath.   Objective:    General: Well Developed, well nourished, and in no acute distress.  Neuro: Alert and oriented x3, extra-ocular muscles intact, sensation grossly intact.  HEENT: Normocephalic, atraumatic, pupils equal round reactive to light, neck supple, no masses, no lymphadenopathy, thyroid nonpalpable.  Skin: Warm and dry, no rashes. Cardiac: Regular rate and rhythm, no murmurs rubs or gallops, no lower extremity edema.  Respiratory: Clear to auscultation bilaterally. Not using accessory muscles, speaking in full sentences.  Impression and Recommendations:    Primary osteoarthritis of both knees At this point we have finished several series of Viscosupplementation, steroid injections, pain medication, persistent discomfort. Initially we were thinking of going for Coolief radiofrequency ablation. I am however going to refer him to Dr. Berenice Primas to discuss bilateral knee arthroplasty.    Hypothyroidism Due to recheck TSH on levothyroxine.  TSH still very elevated, increasing levothyroxine to 59mg.   Recheck in 6 weeks.  Iron deficiency anemia Rechecking iron indices. ___________________________________________ TGwen Her TDianah Field M.D., ABFM., CAQSM. Primary Care and Sports Medicine [facility] Adjunct Professor of [employer] [employer] [employer]

## 2018-01-30 NOTE — Assessment & Plan Note (Signed)
At this point we have finished several series of Viscosupplementation, steroid injections, pain medication, persistent discomfort. Initially we were thinking of going for Coolief radiofrequency ablation. I am however going to refer him to Dr. Berenice Primas to discuss bilateral knee arthroplasty.

## 2018-01-30 NOTE — Assessment & Plan Note (Addendum)
Due to recheck TSH on levothyroxine.  TSH still very elevated, increasing levothyroxine to 43mcg.   Recheck in 6 weeks.

## 2018-01-31 ENCOUNTER — Ambulatory Visit: Payer: Medicare Other | Admitting: Sports Medicine

## 2018-01-31 ENCOUNTER — Encounter: Payer: Self-pay | Admitting: Sports Medicine

## 2018-02-01 DIAGNOSIS — M25562 Pain in left knee: Secondary | ICD-10-CM | POA: Diagnosis not present

## 2018-02-01 DIAGNOSIS — M5442 Lumbago with sciatica, left side: Secondary | ICD-10-CM | POA: Diagnosis not present

## 2018-02-01 DIAGNOSIS — M545 Low back pain: Secondary | ICD-10-CM | POA: Diagnosis not present

## 2018-02-01 DIAGNOSIS — M25561 Pain in right knee: Secondary | ICD-10-CM | POA: Diagnosis not present

## 2018-02-02 DIAGNOSIS — E538 Deficiency of other specified B group vitamins: Secondary | ICD-10-CM | POA: Diagnosis not present

## 2018-02-02 DIAGNOSIS — E039 Hypothyroidism, unspecified: Secondary | ICD-10-CM | POA: Diagnosis not present

## 2018-02-02 DIAGNOSIS — D500 Iron deficiency anemia secondary to blood loss (chronic): Secondary | ICD-10-CM | POA: Diagnosis not present

## 2018-02-03 LAB — ANEMIA PROFILE B
Basophils Absolute: 0 10*3/uL (ref 0.0–0.2)
Basos: 1 %
EOS (ABSOLUTE): 0.1 10*3/uL (ref 0.0–0.4)
Eos: 1 %
Ferritin: 29 ng/mL — ABNORMAL LOW (ref 30–400)
Folate: 19.7 ng/mL (ref >3.0)
Hematocrit: 38.5 % (ref 37.5–51.0)
Hemoglobin: 13 g/dL (ref 13.0–17.7)
Immature Grans (Abs): 0 10*3/uL (ref 0.0–0.1)
Immature Granulocytes: 0 %
Iron Saturation: 29 % (ref 15–55)
Iron: 103 ug/dL (ref 38–169)
Lymphocytes Absolute: 2.2 x10E3/uL (ref 0.7–3.1)
Lymphs: 33 %
MCH: 31.9 pg (ref 26.6–33.0)
MCHC: 33.8 g/dL (ref 31.5–35.7)
MCV: 94 fL (ref 79–97)
Monocytes Absolute: 0.8 x10E3/uL (ref 0.1–0.9)
Monocytes: 11 %
Neutrophils Absolute: 3.6 x10E3/uL (ref 1.4–7.0)
Neutrophils: 54 %
Platelets: 185 10*3/uL (ref 150–450)
RBC: 4.08 x10E6/uL — ABNORMAL LOW (ref 4.14–5.80)
RDW: 13.5 % (ref 12.3–15.4)
Retic Ct Pct: 1.3 % (ref 0.6–2.6)
Total Iron Binding Capacity: 357 ug/dL (ref 250–450)
UIBC: 254 ug/dL (ref 111–343)
Vitamin B-12: 865 pg/mL (ref 232–1245)
WBC: 6.7 x10E3/uL (ref 3.4–10.8)

## 2018-02-03 LAB — TSH: TSH: 47.59 u[IU]/mL — ABNORMAL HIGH (ref 0.450–4.500)

## 2018-02-05 ENCOUNTER — Other Ambulatory Visit: Payer: Self-pay

## 2018-02-05 ENCOUNTER — Other Ambulatory Visit: Payer: Self-pay | Admitting: Orthopedic Surgery

## 2018-02-05 DIAGNOSIS — M25562 Pain in left knee: Secondary | ICD-10-CM

## 2018-02-05 MED ORDER — LEVOTHYROXINE SODIUM 88 MCG PO TABS: 88.0000 ug | ORAL_TABLET | Freq: Every day | ORAL | 3 refills | Status: DC

## 2018-02-05 NOTE — Addendum Note (Signed)
Addended by: Silverio Decamp on: 02/05/2018 10:36 AM   Modules accepted: Orders

## 2018-02-06 ENCOUNTER — Other Ambulatory Visit: Payer: Self-pay | Admitting: Hematology & Oncology

## 2018-02-06 ENCOUNTER — Other Ambulatory Visit: Payer: Self-pay | Admitting: Sports Medicine

## 2018-02-12 ENCOUNTER — Ambulatory Visit (INDEPENDENT_AMBULATORY_CARE_PROVIDER_SITE_OTHER): Payer: Medicare Other

## 2018-02-12 DIAGNOSIS — M25562 Pain in left knee: Secondary | ICD-10-CM | POA: Diagnosis not present

## 2018-02-12 DIAGNOSIS — M1712 Unilateral primary osteoarthritis, left knee: Secondary | ICD-10-CM | POA: Diagnosis not present

## 2018-02-20 ENCOUNTER — Encounter: Payer: Self-pay | Admitting: Sports Medicine

## 2018-02-26 DIAGNOSIS — M25562 Pain in left knee: Secondary | ICD-10-CM | POA: Diagnosis not present

## 2018-02-26 DIAGNOSIS — S83242A Other tear of medial meniscus, current injury, left knee, initial encounter: Secondary | ICD-10-CM | POA: Diagnosis not present

## 2018-02-27 ENCOUNTER — Telehealth: Payer: Self-pay | Admitting: Cardiology

## 2018-02-27 NOTE — Telephone Encounter (Signed)
° °  [facility] Pre-operative Risk Assessment    Request for surgical clearance:  1. What type of surgery is being performed? Left Knee Arthroscopy   2. When is this surgery scheduled?  Pending  3. What type of clearance is required (medical clearance vs. Pharmacy clearance to hold med vs. Both)? Both  4. Are there any medications that need to be held prior to surgery and how long? Is there any medicine should stop for his surgery?   5. Practice name and name of physician performing surgery?  Dr Kendall Flack   6. What is your office phone number [phone]   7.   What is your office fax number [phone]  8.   Anesthesia type (None, local, MAC, general) ? General  [name] [name] 02/27/2018, 11:51 AM  _________________________________________________________________   (provider comments below)

## 2018-02-28 NOTE — Telephone Encounter (Signed)
   Primary Cardiologist: Kirk Ruths, MD  Chart reviewed as part of pre-operative protocol coverage. Given past medical history and time since last visit, based on ACC/AHA guidelines, [name] [name] would be at acceptable risk for the planned procedure without further cardiovascular testing. He is getting about 4 Mets of activity.   Dr. Stanford Breed, Can patient hold ASA for 5-7 day prior to surgery?  Pt has had recurrent PE/DVT. He is currently on Xarelto 10 mg followed by Dr Marin Olp. Will ask Dr. Marin Olp to comment on Xarelto.   Please forward your response to P [facility] PREOP.   Thank you  Leanor Kail, PA 02/28/2018, 4:06 PM

## 2018-02-28 NOTE — Telephone Encounter (Signed)
Continue ASA 81 mg daily if possible [name] [name]

## 2018-03-08 ENCOUNTER — Inpatient Hospital Stay: Payer: Medicare Other

## 2018-03-08 ENCOUNTER — Encounter: Payer: Self-pay | Admitting: Hematology & Oncology

## 2018-03-08 ENCOUNTER — Other Ambulatory Visit: Payer: Self-pay

## 2018-03-08 ENCOUNTER — Inpatient Hospital Stay: Payer: Medicare Other | Attending: Hematology & Oncology | Admitting: Hematology & Oncology

## 2018-03-08 VITALS — BP 111/49 | HR 54 | Temp 97.9°F | Wt 217.8 lb

## 2018-03-08 DIAGNOSIS — Z9221 Personal history of antineoplastic chemotherapy: Secondary | ICD-10-CM | POA: Diagnosis not present

## 2018-03-08 DIAGNOSIS — C9000 Multiple myeloma not having achieved remission: Secondary | ICD-10-CM

## 2018-03-08 DIAGNOSIS — E039 Hypothyroidism, unspecified: Secondary | ICD-10-CM

## 2018-03-08 DIAGNOSIS — D500 Iron deficiency anemia secondary to blood loss (chronic): Secondary | ICD-10-CM

## 2018-03-08 DIAGNOSIS — Z79899 Other long term (current) drug therapy: Secondary | ICD-10-CM | POA: Diagnosis not present

## 2018-03-08 DIAGNOSIS — Z7901 Long term (current) use of anticoagulants: Secondary | ICD-10-CM

## 2018-03-08 DIAGNOSIS — Z86718 Personal history of other venous thrombosis and embolism: Secondary | ICD-10-CM | POA: Diagnosis not present

## 2018-03-08 LAB — CMP (CANCER CENTER ONLY)
ALK PHOS: 114 U/L (ref 38–126)
ALT: 60 U/L — AB (ref 0–44)
AST: 43 U/L — ABNORMAL HIGH (ref 15–41)
Albumin: 4.3 g/dL (ref 3.5–5.0)
Anion gap: 7 (ref 5–15)
BUN: 17 mg/dL (ref 8–23)
CALCIUM: 9.4 mg/dL (ref 8.9–10.3)
CO2: 28 mmol/L (ref 22–32)
Chloride: 103 mmol/L (ref 98–111)
Creatinine: 1.27 mg/dL — ABNORMAL HIGH (ref 0.61–1.24)
GFR, Estimated: 54 mL/min — ABNORMAL LOW (ref >60)
Glucose, Bld: 128 mg/dL — ABNORMAL HIGH (ref 70–99)
Potassium: 4 mmol/L (ref 3.5–5.1)
Sodium: 138 mmol/L (ref 135–145)
Total Bilirubin: 0.6 mg/dL (ref 0.3–1.2)
Total Protein: 6.3 g/dL — ABNORMAL LOW (ref 6.5–8.1)

## 2018-03-08 LAB — CBC WITH DIFFERENTIAL (CANCER CENTER ONLY)
Abs Immature Granulocytes: 0.02 10*3/uL (ref 0.00–0.07)
BASOS ABS: 0 10*3/uL (ref 0.0–0.1)
Basophils Relative: 1 %
Eosinophils Absolute: 0.1 10*3/uL (ref 0.0–0.5)
Eosinophils Relative: 1 %
HEMATOCRIT: 37.5 % — AB (ref 39.0–52.0)
HEMOGLOBIN: 12.5 g/dL — AB (ref 13.0–17.0)
Immature Granulocytes: 0 %
LYMPHS PCT: 29 %
Lymphs Abs: 1.7 10*3/uL (ref 0.7–4.0)
MCH: 31.2 pg (ref 26.0–34.0)
MCHC: 33.3 g/dL (ref 30.0–36.0)
MCV: 93.5 fL (ref 80.0–100.0)
Monocytes Absolute: 0.7 10*3/uL (ref 0.1–1.0)
Monocytes Relative: 12 %
Neutro Abs: 3.3 10*3/uL (ref 1.7–7.7)
Neutrophils Relative %: 57 %
Platelet Count: 189 10*3/uL (ref 150–400)
RBC: 4.01 MIL/uL — ABNORMAL LOW (ref 4.22–5.81)
RDW: 13 % (ref 11.5–15.5)
WBC Count: 5.8 10*3/uL (ref 4.0–10.5)
nRBC: 0 % (ref 0.0–0.2)

## 2018-03-08 NOTE — Progress Notes (Signed)
Hematology and Oncology Follow Up Visit  [name] [name] [id number] [birth year] 78 y.o. 03/08/2018   Principle Diagnosis:  IgG kappa myeloma - Trisomy 11 by FISH Acute thromboembolism of the right lower leg  Current Therapy:   Revlimid 10mg  po q day (21/28) -- d/c on 09/15/2017 Zometa 4 mg IV every 3 months  - next dose in - 03/2018 Xarelto 10 mg by mouth daily  Past Therapy:  Patient s/p cycle 5 of Velcade/Revlimid/Decadron S/P ASCT at [facility] on 04/02/2015   Interim History:  [name] [name] is here today for follow-up.  Unfortunately, [name] [name] is going need to have surgery on his left knee.  It sounds like [name] [name] has a torn meniscus.  [name] [name] is having the surgery on January 29.  Given that [name] [name] is on Xarelto for, I told to stop the Xarelto 3 days before surgery and restart the Xarelto the day after surgery.   Otherwise, [name] [name] seems to be doing fairly well.  There is been no problems with respect to his myeloma.  [name] [name] has had no evidence of myeloma recurrence.  His last myeloma studies showed no monoclonal spike in his blood.  [name] [name] is seen a chiropractor for his back.  This is helping a little bit.  His appetite is doing okay.  Is having no problems with bowels or bladder.  [name] [name] and his wife did have a nice Thanksgiving and Christmas holiday.   Overall, his performance status is ECOG 1. Medications:  Allergies as of 03/08/2018      Reactions   Budesonide-formoterol Fumarate Itching, Other (See Comments)   Codeine Itching   Hydrocodone-acetaminophen Itching   Mushroom Extract Complex Nausea And Vomiting, Other (See Comments)   Only shitake mushroom  causes this reaction. Flu-like symptoms from portabello mushrooms      Medication List       Accurate as of March 08, 2018  1:17 PM. Always use your most recent med list.        amLODipine 5 MG tablet Commonly known as:  NORVASC TAKE 1 TABLET EVERY DAY   aspirin EC 81 MG tablet Take by mouth.   carvedilol 6.25 MG tablet Commonly known as:   COREG TAKE 1 TABLET TWICE DAILY WITH A MEAL   cetirizine 10 MG tablet Commonly known as:  ZYRTEC ALLERGY Take 1 tablet (10 mg total) by mouth daily.   esomeprazole 40 MG capsule Commonly known as:  NEXIUM TAKE 1 CAPSULE EVERY DAY  AT  12  NOON.   famciclovir 500 MG tablet Commonly known as:  FAMVIR TAKE 1 TABLET EVERY DAY   FUSION PLUS Caps Take 1 tablet by mouth daily.   gabapentin 600 MG tablet Commonly known as:  NEURONTIN TAKE 1 TABLET EVERY MORNING  AND TAKE 2 TABLETS EVERY NIGHT   hydrochlorothiazide 12.5 MG capsule Commonly known as:  MICROZIDE TAKE 1 CAPSULE EVERY DAY   isosorbide mononitrate 30 MG 24 hr tablet Commonly known as:  IMDUR TAKE 1 TABLET TWICE DAILY   levothyroxine 88 MCG tablet Commonly known as:  SYNTHROID Take 1 tablet (88 mcg total) by mouth daily before breakfast.   LORazepam 1 MG tablet Commonly known as:  ATIVAN TAKE 1 TABLET AT BEDTIME   nitroGLYCERIN 0.4 MG SL tablet Commonly known as:  NITROSTAT Place 0.4 mg under the tongue every 5 (five) minutes as needed. For chest pain   oxyCODONE-acetaminophen 10-325 MG tablet Commonly known as:  PERCOCET Take 1 tablet by mouth every 8 (eight) hours as needed.   potassium  chloride SA 20 MEQ tablet Commonly known as:  K-DUR,KLOR-CON TAKE 1 TABLET TWICE DAILY   pyridoxine 100 MG tablet Commonly known as:  B-6 Take 100 mg by mouth daily.   RA VITAMIN B-12 TR 1000 MCG Tbcr Generic drug:  Cyanocobalamin Take 1 tablet by mouth daily.   rivaroxaban 10 MG Tabs tablet Commonly known as:  XARELTO Take 1 tablet (10 mg total) by mouth daily with supper.       Allergies:  Allergies  Allergen Reactions  . Budesonide-Formoterol Fumarate Itching and Other (See Comments)  . Codeine Itching  . Hydrocodone-Acetaminophen Itching  . Mushroom Extract Complex Nausea And Vomiting and Other (See Comments)    Only shitake mushroom  causes this reaction. Flu-like symptoms from portabello mushrooms     Past Medical History, Surgical history, Social history, and Family History were reviewed and updated.  Review of Systems: Review of Systems  Respiratory: Positive for cough.   Musculoskeletal: Positive for back pain.  All other systems reviewed and are negative.    Physical Exam:  weight is 217 lb 12.8 oz (98.8 kg). His oral temperature is 97.9 F (36.6 C). His blood pressure is 111/49 (abnormal) and his pulse is 54 (abnormal).   Wt Readings from Last 3 Encounters:  03/08/18 217 lb 12.8 oz (98.8 kg)  01/30/18 217 lb (98.4 kg)  01/05/18 220 lb 12 oz (100.1 kg)    Physical Exam Vitals signs reviewed.  HENT:     Head: Normocephalic and atraumatic.  Eyes:     Pupils: Pupils are equal, round, and reactive to light.  Neck:     Musculoskeletal: Normal range of motion.  Cardiovascular:     Rate and Rhythm: Normal rate and regular rhythm.     Heart sounds: Normal heart sounds.  Pulmonary:     Effort: Pulmonary effort is normal.     Breath sounds: Normal breath sounds.  Abdominal:     General: Bowel sounds are normal.     Palpations: Abdomen is soft.  Musculoskeletal: Normal range of motion.        General: No tenderness or deformity.  Lymphadenopathy:     Cervical: No cervical adenopathy.  Skin:    General: Skin is warm and dry.     Findings: No erythema or rash.  Neurological:     Mental Status: [name] [name] is alert and oriented to person, place, and time.  Psychiatric:        Behavior: Behavior normal.        Thought Content: Thought content normal.        Judgment: Judgment normal.      Lab Results  Component Value Date   WBC 5.8 03/08/2018   HGB 12.5 (L) 03/08/2018   HCT 37.5 (L) 03/08/2018   MCV 93.5 03/08/2018   PLT 189 03/08/2018   Lab Results  Component Value Date   FERRITIN 29 (L) 02/02/2018   IRON 103 02/02/2018   TIBC 357 02/02/2018   UIBC 254 02/02/2018   IRONPCTSAT 29 02/02/2018   Lab Results  Component Value Date   RETICCTPCT 1.3 02/02/2018    RBC 4.01 (L) 03/08/2018   Lab Results  Component Value Date   KPAFRELGTCHN 8.3 11/24/2017   LAMBDASER 9.2 11/24/2017   KAPLAMBRATIO 0.90 11/24/2017   Lab Results  Component Value Date   IGGSERUM 820 01/05/2018   IGA 52 (L) 01/05/2018   IGMSERUM 32 01/05/2018   Lab Results  Component Value Date   TOTALPROTELP 5.9 (L) 01/05/2018  ALBUMINELP 3.4 11/24/2017   A1GS 0.1 11/24/2017   A2GS 0.7 11/24/2017   BETS 0.8 11/24/2017   BETA2SER 0.3 02/11/2015   GAMS 0.9 11/24/2017   MSPIKE Not Observed 11/24/2017   SPEI Comment 03/14/2017     Chemistry      Component Value Date/Time   NA 138 03/08/2018 1148   NA 140 01/27/2017 1044   NA 141 06/05/2015 1036   K 4.0 03/08/2018 1148   K 3.3 01/27/2017 1044   K 2.9 (LL) 06/05/2015 1036   CL 103 03/08/2018 1148   CL 101 01/27/2017 1044   CO2 28 03/08/2018 1148   CO2 28 01/27/2017 1044   CO2 27 06/05/2015 1036   BUN 17 03/08/2018 1148   BUN 14 01/27/2017 1044   BUN 13.1 06/05/2015 1036   CREATININE 1.27 (H) 03/08/2018 1148   CREATININE 1.59 (H) 10/06/2017 1215   CREATININE 1.0 06/05/2015 1036   GLU 152 03/30/2016      Component Value Date/Time   CALCIUM 9.4 03/08/2018 1148   CALCIUM 8.9 01/27/2017 1044   CALCIUM 9.4 06/05/2015 1036   ALKPHOS 114 03/08/2018 1148   ALKPHOS 126 (H) 01/27/2017 1044   ALKPHOS 94 06/05/2015 1036   AST 43 (H) 03/08/2018 1148   AST 22 06/05/2015 1036   ALT 60 (H) 03/08/2018 1148   ALT 33 01/27/2017 1044   ALT 18 06/05/2015 1036   BILITOT 0.6 03/08/2018 1148   BILITOT 0.59 06/05/2015 1036      Impression and Plan: [name] [name] is a very pleasant 78 yo caucasian gentleman with history of IgG kappa myeloma and ASCT at [facility] in February 2017.   I am happy that his myeloma has done so well this year.  [name] [name] has been off the Revlimid now for 3 months.  Hopefully, we will continue to do find that his monoclonal spike does not reappear.  I do not anticipate any problems with respect to his surgery.  This  will be an arthroscopic knee surgery so I do not think there should be any issues with bleeding.  I will see him back in February.  I want to see him back little bit sooner than 6 weeks so that we can follow-up after his knee surgery to make sure everything is going okay.  [name], MD 1/23/20201:17 PM

## 2018-03-09 DIAGNOSIS — M9905 Segmental and somatic dysfunction of pelvic region: Secondary | ICD-10-CM | POA: Diagnosis not present

## 2018-03-09 DIAGNOSIS — M9903 Segmental and somatic dysfunction of lumbar region: Secondary | ICD-10-CM | POA: Diagnosis not present

## 2018-03-09 DIAGNOSIS — M9901 Segmental and somatic dysfunction of cervical region: Secondary | ICD-10-CM | POA: Diagnosis not present

## 2018-03-09 DIAGNOSIS — M5431 Sciatica, right side: Secondary | ICD-10-CM | POA: Diagnosis not present

## 2018-03-09 LAB — PROTEIN ELECTROPHORESIS, SERUM, WITH REFLEX
A/G Ratio: 1.4 (ref 0.7–1.7)
ALPHA-1-GLOBULIN: 0.2 g/dL (ref 0.0–0.4)
Albumin ELP: 3.6 g/dL (ref 2.9–4.4)
Alpha-2-Globulin: 0.7 g/dL (ref 0.4–1.0)
Beta Globulin: 0.8 g/dL (ref 0.7–1.3)
Gamma Globulin: 0.7 g/dL (ref 0.4–1.8)
Globulin, Total: 2.5 g/dL (ref 2.2–3.9)
TOTAL PROTEIN ELP: 6.1 g/dL (ref 6.0–8.5)

## 2018-03-09 LAB — KAPPA/LAMBDA LIGHT CHAINS
Kappa free light chain: 10.7 mg/L (ref 3.3–19.4)
Kappa, lambda light chain ratio: 1.3 (ref 0.26–1.65)
Lambda free light chains: 8.2 mg/L (ref 5.7–26.3)

## 2018-03-09 LAB — IGG, IGA, IGM
IgA: 50 mg/dL — ABNORMAL LOW (ref 61–437)
IgG (Immunoglobin G), Serum: 843 mg/dL (ref 700–1600)
IgM (Immunoglobulin M), Srm: 42 mg/dL (ref 15–143)

## 2018-03-09 LAB — FERRITIN: Ferritin: 42 ng/mL (ref 24–336)

## 2018-03-09 LAB — IRON AND TIBC
Iron: 91 ug/dL (ref 42–163)
Saturation Ratios: 26 % (ref 20–55)
TIBC: 348 ug/dL (ref 202–409)
UIBC: 257 ug/dL (ref 117–376)

## 2018-03-09 LAB — TSH: TSH: 9.084 u[IU]/mL — ABNORMAL HIGH (ref 0.320–4.118)

## 2018-03-12 ENCOUNTER — Encounter: Payer: Self-pay | Admitting: *Deleted

## 2018-03-12 ENCOUNTER — Telehealth: Payer: Self-pay | Admitting: *Deleted

## 2018-03-12 MED ORDER — CETIRIZINE HCL 10 MG PO TABS: 10.0000 mg | ORAL_TABLET | Freq: Every day | ORAL | 1 refills | Status: DC

## 2018-03-12 NOTE — Telephone Encounter (Signed)
-----   Message from [name], MD sent at 03/12/2018  6:59 AM EST ----- Call - the TSH is coming down nicely. No change in the Synthroid dose. [name]

## 2018-03-12 NOTE — Progress Notes (Signed)
Per Dr. Marin Olp, patient needs to STOP the Aspirin 81 mg five days prior to surgery. STOP the Xarelto three days prior to surgery. These directions were faxed to Dr. Berenice Primas office. Fax number is [phone].

## 2018-03-12 NOTE — Telephone Encounter (Signed)
As noted below by Dr. Marin Olp, I left a message regarding his TSH level. There are NO changes in the Synthroid dose. Instructed him to call the office if he has any questions or concerns.

## 2018-03-13 ENCOUNTER — Encounter: Payer: Self-pay | Admitting: Sports Medicine

## 2018-03-13 DIAGNOSIS — E039 Hypothyroidism, unspecified: Secondary | ICD-10-CM

## 2018-03-13 MED ORDER — LORAZEPAM 1 MG PO TABS: 1.0000 mg | ORAL_TABLET | Freq: Every day | ORAL | 0 refills | Status: DC

## 2018-03-13 MED ORDER — LEVOTHYROXINE SODIUM 88 MCG PO TABS: 88.0000 ug | ORAL_TABLET | Freq: Every day | ORAL | 3 refills | Status: DC

## 2018-03-13 MED ORDER — ESOMEPRAZOLE MAGNESIUM 40 MG PO CPDR: 40.0000 mg | DELAYED_RELEASE_CAPSULE | Freq: Every day | ORAL | 3 refills | Status: DC

## 2018-03-13 MED ORDER — CARVEDILOL 6.25 MG PO TABS: 6.2500 mg | ORAL_TABLET | Freq: Two times a day (BID) | ORAL | 3 refills | Status: DC

## 2018-03-13 MED ORDER — GABAPENTIN 600 MG PO TABS: ORAL_TABLET | ORAL | 3 refills | Status: DC

## 2018-03-13 MED ORDER — AMLODIPINE BESYLATE 5 MG PO TABS: 5.0000 mg | ORAL_TABLET | Freq: Every day | ORAL | 3 refills | Status: DC

## 2018-03-13 MED ORDER — HYDROCHLOROTHIAZIDE 12.5 MG PO CAPS: 12.5000 mg | ORAL_CAPSULE | Freq: Every day | ORAL | 3 refills | Status: DC

## 2018-03-13 MED ORDER — ISOSORBIDE MONONITRATE ER 30 MG PO TB24: 30.0000 mg | ORAL_TABLET | Freq: Two times a day (BID) | ORAL | 3 refills | Status: DC

## 2018-03-14 DIAGNOSIS — G8918 Other acute postprocedural pain: Secondary | ICD-10-CM | POA: Diagnosis not present

## 2018-03-14 DIAGNOSIS — M94262 Chondromalacia, left knee: Secondary | ICD-10-CM | POA: Diagnosis not present

## 2018-03-14 DIAGNOSIS — M23222 Derangement of posterior horn of medial meniscus due to old tear or injury, left knee: Secondary | ICD-10-CM | POA: Diagnosis not present

## 2018-03-14 DIAGNOSIS — M23252 Derangement of posterior horn of lateral meniscus due to old tear or injury, left knee: Secondary | ICD-10-CM | POA: Diagnosis not present

## 2018-03-14 DIAGNOSIS — M23242 Derangement of anterior horn of lateral meniscus due to old tear or injury, left knee: Secondary | ICD-10-CM | POA: Diagnosis not present

## 2018-03-20 DIAGNOSIS — M25462 Effusion, left knee: Secondary | ICD-10-CM | POA: Diagnosis not present

## 2018-03-26 DIAGNOSIS — M6281 Muscle weakness (generalized): Secondary | ICD-10-CM | POA: Diagnosis not present

## 2018-03-26 DIAGNOSIS — M25662 Stiffness of left knee, not elsewhere classified: Secondary | ICD-10-CM | POA: Diagnosis not present

## 2018-03-26 DIAGNOSIS — M25462 Effusion, left knee: Secondary | ICD-10-CM | POA: Diagnosis not present

## 2018-03-26 DIAGNOSIS — M25562 Pain in left knee: Secondary | ICD-10-CM | POA: Diagnosis not present

## 2018-03-28 DIAGNOSIS — M25662 Stiffness of left knee, not elsewhere classified: Secondary | ICD-10-CM | POA: Diagnosis not present

## 2018-03-28 DIAGNOSIS — M6281 Muscle weakness (generalized): Secondary | ICD-10-CM | POA: Diagnosis not present

## 2018-03-28 DIAGNOSIS — M25562 Pain in left knee: Secondary | ICD-10-CM | POA: Diagnosis not present

## 2018-03-28 DIAGNOSIS — M25462 Effusion, left knee: Secondary | ICD-10-CM | POA: Diagnosis not present

## 2018-03-29 MED ORDER — CETIRIZINE HCL 10 MG PO TABS: 10.0000 mg | ORAL_TABLET | Freq: Every day | ORAL | 1 refills | Status: DC

## 2018-03-30 ENCOUNTER — Other Ambulatory Visit: Payer: Medicare Other

## 2018-03-30 DIAGNOSIS — M25662 Stiffness of left knee, not elsewhere classified: Secondary | ICD-10-CM | POA: Diagnosis not present

## 2018-03-30 DIAGNOSIS — M25462 Effusion, left knee: Secondary | ICD-10-CM | POA: Diagnosis not present

## 2018-03-30 DIAGNOSIS — M6281 Muscle weakness (generalized): Secondary | ICD-10-CM | POA: Diagnosis not present

## 2018-03-30 DIAGNOSIS — M25562 Pain in left knee: Secondary | ICD-10-CM | POA: Diagnosis not present

## 2018-04-02 DIAGNOSIS — M25462 Effusion, left knee: Secondary | ICD-10-CM | POA: Diagnosis not present

## 2018-04-02 DIAGNOSIS — M6281 Muscle weakness (generalized): Secondary | ICD-10-CM | POA: Diagnosis not present

## 2018-04-02 DIAGNOSIS — M25562 Pain in left knee: Secondary | ICD-10-CM | POA: Diagnosis not present

## 2018-04-02 DIAGNOSIS — M25662 Stiffness of left knee, not elsewhere classified: Secondary | ICD-10-CM | POA: Diagnosis not present

## 2018-04-03 ENCOUNTER — Encounter: Payer: Self-pay | Admitting: Hematology & Oncology

## 2018-04-04 DIAGNOSIS — M25562 Pain in left knee: Secondary | ICD-10-CM | POA: Diagnosis not present

## 2018-04-04 DIAGNOSIS — M6281 Muscle weakness (generalized): Secondary | ICD-10-CM | POA: Diagnosis not present

## 2018-04-04 DIAGNOSIS — M25662 Stiffness of left knee, not elsewhere classified: Secondary | ICD-10-CM | POA: Diagnosis not present

## 2018-04-04 DIAGNOSIS — M25462 Effusion, left knee: Secondary | ICD-10-CM | POA: Diagnosis not present

## 2018-04-05 ENCOUNTER — Encounter: Payer: Self-pay | Admitting: Sports Medicine

## 2018-04-05 MED ORDER — OSELTAMIVIR PHOSPHATE 75 MG PO CAPS: 75.0000 mg | ORAL_CAPSULE | Freq: Every day | ORAL | 0 refills | Status: DC

## 2018-04-10 DIAGNOSIS — M6281 Muscle weakness (generalized): Secondary | ICD-10-CM | POA: Diagnosis not present

## 2018-04-10 DIAGNOSIS — M25562 Pain in left knee: Secondary | ICD-10-CM | POA: Diagnosis not present

## 2018-04-10 DIAGNOSIS — M25462 Effusion, left knee: Secondary | ICD-10-CM | POA: Diagnosis not present

## 2018-04-10 DIAGNOSIS — M25662 Stiffness of left knee, not elsewhere classified: Secondary | ICD-10-CM | POA: Diagnosis not present

## 2018-04-12 ENCOUNTER — Ambulatory Visit: Payer: Medicare Other

## 2018-04-12 ENCOUNTER — Other Ambulatory Visit: Payer: Medicare Other

## 2018-04-12 ENCOUNTER — Ambulatory Visit: Payer: Medicare Other | Admitting: Hematology & Oncology

## 2018-04-12 DIAGNOSIS — M25462 Effusion, left knee: Secondary | ICD-10-CM | POA: Diagnosis not present

## 2018-04-12 DIAGNOSIS — M25562 Pain in left knee: Secondary | ICD-10-CM | POA: Diagnosis not present

## 2018-04-13 DIAGNOSIS — M25562 Pain in left knee: Secondary | ICD-10-CM | POA: Diagnosis not present

## 2018-04-13 DIAGNOSIS — M25662 Stiffness of left knee, not elsewhere classified: Secondary | ICD-10-CM | POA: Diagnosis not present

## 2018-04-13 DIAGNOSIS — M25462 Effusion, left knee: Secondary | ICD-10-CM | POA: Diagnosis not present

## 2018-04-13 DIAGNOSIS — M6281 Muscle weakness (generalized): Secondary | ICD-10-CM | POA: Diagnosis not present

## 2018-04-15 ENCOUNTER — Encounter: Payer: Self-pay | Admitting: Sports Medicine

## 2018-04-16 MED ORDER — ESOMEPRAZOLE MAGNESIUM 40 MG PO CPDR: 40.0000 mg | DELAYED_RELEASE_CAPSULE | Freq: Every day | ORAL | 3 refills | Status: DC

## 2018-04-16 MED ORDER — POTASSIUM CHLORIDE CRYS ER 20 MEQ PO TBCR: 20.0000 meq | EXTENDED_RELEASE_TABLET | Freq: Two times a day (BID) | ORAL | 0 refills | Status: DC

## 2018-04-17 DIAGNOSIS — M6281 Muscle weakness (generalized): Secondary | ICD-10-CM | POA: Diagnosis not present

## 2018-04-17 DIAGNOSIS — M25562 Pain in left knee: Secondary | ICD-10-CM | POA: Diagnosis not present

## 2018-04-17 DIAGNOSIS — M25662 Stiffness of left knee, not elsewhere classified: Secondary | ICD-10-CM | POA: Diagnosis not present

## 2018-04-17 DIAGNOSIS — M25462 Effusion, left knee: Secondary | ICD-10-CM | POA: Diagnosis not present

## 2018-04-19 DIAGNOSIS — M25462 Effusion, left knee: Secondary | ICD-10-CM | POA: Diagnosis not present

## 2018-04-19 DIAGNOSIS — M25562 Pain in left knee: Secondary | ICD-10-CM | POA: Diagnosis not present

## 2018-04-19 DIAGNOSIS — M25662 Stiffness of left knee, not elsewhere classified: Secondary | ICD-10-CM | POA: Diagnosis not present

## 2018-04-19 DIAGNOSIS — M6281 Muscle weakness (generalized): Secondary | ICD-10-CM | POA: Diagnosis not present

## 2018-04-23 ENCOUNTER — Other Ambulatory Visit: Payer: Self-pay

## 2018-04-23 ENCOUNTER — Telehealth: Payer: Self-pay | Admitting: Hematology & Oncology

## 2018-04-23 ENCOUNTER — Encounter: Payer: Self-pay | Admitting: Hematology & Oncology

## 2018-04-23 ENCOUNTER — Inpatient Hospital Stay: Payer: Medicare Other

## 2018-04-23 ENCOUNTER — Inpatient Hospital Stay: Payer: Medicare Other | Attending: Hematology & Oncology

## 2018-04-23 ENCOUNTER — Inpatient Hospital Stay (HOSPITAL_BASED_OUTPATIENT_CLINIC_OR_DEPARTMENT_OTHER): Payer: Medicare Other | Admitting: Hematology & Oncology

## 2018-04-23 VITALS — BP 116/59 | HR 63 | Temp 97.9°F | Resp 16 | Wt 208.0 lb

## 2018-04-23 DIAGNOSIS — C9000 Multiple myeloma not having achieved remission: Secondary | ICD-10-CM

## 2018-04-23 DIAGNOSIS — E039 Hypothyroidism, unspecified: Secondary | ICD-10-CM

## 2018-04-23 DIAGNOSIS — D500 Iron deficiency anemia secondary to blood loss (chronic): Secondary | ICD-10-CM

## 2018-04-23 DIAGNOSIS — M899 Disorder of bone, unspecified: Secondary | ICD-10-CM

## 2018-04-23 DIAGNOSIS — C9001 Multiple myeloma in remission: Secondary | ICD-10-CM

## 2018-04-23 LAB — CBC WITH DIFFERENTIAL (CANCER CENTER ONLY)
Abs Immature Granulocytes: 0.02 10*3/uL (ref 0.00–0.07)
Basophils Absolute: 0 10*3/uL (ref 0.0–0.1)
Basophils Relative: 0 %
Eosinophils Absolute: 0.1 10*3/uL (ref 0.0–0.5)
Eosinophils Relative: 1 %
HCT: 39.4 % (ref 39.0–52.0)
Hemoglobin: 12.9 g/dL — ABNORMAL LOW (ref 13.0–17.0)
IMMATURE GRANULOCYTES: 0 %
LYMPHS ABS: 1.8 10*3/uL (ref 0.7–4.0)
Lymphocytes Relative: 26 %
MCH: 30.2 pg (ref 26.0–34.0)
MCHC: 32.7 g/dL (ref 30.0–36.0)
MCV: 92.3 fL (ref 80.0–100.0)
MONOS PCT: 9 %
Monocytes Absolute: 0.6 10*3/uL (ref 0.1–1.0)
NEUTROS PCT: 64 %
Neutro Abs: 4.2 10*3/uL (ref 1.7–7.7)
Platelet Count: 166 10*3/uL (ref 150–400)
RBC: 4.27 MIL/uL (ref 4.22–5.81)
RDW: 12.9 % (ref 11.5–15.5)
WBC Count: 6.8 10*3/uL (ref 4.0–10.5)
nRBC: 0 % (ref 0.0–0.2)

## 2018-04-23 LAB — LACTATE DEHYDROGENASE: LDH: 172 U/L (ref 98–192)

## 2018-04-23 LAB — CMP (CANCER CENTER ONLY)
ALT: 26 U/L (ref 0–44)
AST: 15 U/L (ref 15–41)
Albumin: 4.2 g/dL (ref 3.5–5.0)
Alkaline Phosphatase: 81 U/L (ref 38–126)
Anion gap: 8 (ref 5–15)
BUN: 17 mg/dL (ref 8–23)
CO2: 29 mmol/L (ref 22–32)
Calcium: 9.2 mg/dL (ref 8.9–10.3)
Chloride: 103 mmol/L (ref 98–111)
Creatinine: 1.25 mg/dL — ABNORMAL HIGH (ref 0.61–1.24)
GFR, Est AFR Am: >60 mL/min (ref >60)
GFR, Estimated: 55 mL/min — ABNORMAL LOW (ref >60)
GLUCOSE: 121 mg/dL — AB (ref 70–99)
POTASSIUM: 3.3 mmol/L — AB (ref 3.5–5.1)
Sodium: 140 mmol/L (ref 135–145)
Total Bilirubin: 0.6 mg/dL (ref 0.3–1.2)
Total Protein: 6.6 g/dL (ref 6.5–8.1)

## 2018-04-23 MED ORDER — SODIUM CHLORIDE 0.9 % IV SOLN
INTRAVENOUS | Status: DC
  Administered 2018-04-23: 12:00:00 via INTRAVENOUS
  Filled 2018-04-23: qty 250

## 2018-04-23 MED ORDER — ZOLEDRONIC ACID 4 MG/100ML IV SOLN
4.0000 mg | Freq: Once | INTRAVENOUS | Status: AC
  Administered 2018-04-23: 4 mg via INTRAVENOUS
  Filled 2018-04-23: qty 100

## 2018-04-23 NOTE — Telephone Encounter (Signed)
Appointments scheduled calendar printed per 3/9 los 

## 2018-04-23 NOTE — Progress Notes (Signed)
Hematology and Oncology Follow Up Visit  [name] [name] [id number] 09/20/1940 78 y.o. 04/23/2018   Principle Diagnosis:  IgG kappa myeloma - Trisomy 11 by FISH Acute thromboembolism of the right lower leg  Current Therapy:   Revlimid 10mg  po q day (21/28) -- d/c on 09/15/2017 Zometa 4 mg IV every 3 months  - next dose in - 06/2018 Xarelto 10 mg by mouth daily  Past Therapy:  Patient s/p cycle 5 of Velcade/Revlimid/Decadron S/P ASCT at [place] on 04/02/2015   Interim History:  [name] [name] is here today for follow-up.  He looks quite good.  He had his left knee surgery back in late January.  He is doing quite well with this.  He has had quite a bit of pain but he seems to be getting through this.  He has had no other issues outside of a cough.  He says when he lies down, he has a cough.  Is not productive of any sputum.  He does see a pulmonologist.  I am unsure when he sees the pulmonologist next.  His myeloma studies have still looked quite good.  There is no monoclonal spike in his blood.  His IgG level is 143 mg/dL.  His kappa light chain is 1.1 mg/dL.  He says he sees the transplant doctors at [facility] in a week or so.  He has had no fever.  He has had no rashes.  He has had no headache.  Overall, his performance status is ECOG 1.  Medications:  Allergies as of 04/23/2018      Reactions   Budesonide-formoterol Fumarate Itching, Other (See Comments)   Codeine Itching   Hydrocodone-acetaminophen Itching   Mushroom Extract Complex Nausea And Vomiting, Other (See Comments)   Only shitake mushroom  causes this reaction. Flu-like symptoms from portabello mushrooms      Medication List       Accurate as of April 23, 2018 11:20 AM. Always use your most recent med list.        amLODipine 5 MG tablet Commonly known as:  NORVASC Take 1 tablet (5 mg total) by mouth daily.   aspirin EC 81 MG tablet Take by mouth.   carvedilol 6.25 MG tablet Commonly known as:  COREG Take 1  tablet (6.25 mg total) by mouth 2 (two) times daily with a meal.   cetirizine 10 MG tablet Commonly known as:  ZyrTEC Allergy Take 1 tablet (10 mg total) by mouth daily.   esomeprazole 40 MG capsule Commonly known as:  NEXIUM Take 1 capsule (40 mg total) by mouth daily at 12 noon.   famciclovir 500 MG tablet Commonly known as:  FAMVIR TAKE 1 TABLET EVERY DAY   Fusion Plus Caps Take 1 tablet by mouth daily.   gabapentin 600 MG tablet Commonly known as:  NEURONTIN TAKE 1 TABLET EVERY MORNING  AND TAKE 2 TABLETS EVERY NIGHT   hydrochlorothiazide 12.5 MG capsule Commonly known as:  MICROZIDE Take 1 capsule (12.5 mg total) by mouth daily.   isosorbide mononitrate 30 MG 24 hr tablet Commonly known as:  IMDUR Take 1 tablet (30 mg total) by mouth 2 (two) times daily.   levothyroxine 88 MCG tablet Commonly known as:  SYNTHROID, LEVOTHROID Take 1 tablet (88 mcg total) by mouth daily before breakfast.   LORazepam 1 MG tablet Commonly known as:  ATIVAN Take 1 tablet (1 mg total) by mouth at bedtime.   nitroGLYCERIN 0.4 MG SL tablet Commonly known as:  NITROSTAT Place 0.4 mg  under the tongue every 5 (five) minutes as needed. For chest pain   oseltamivir 75 MG capsule Commonly known as:  Tamiflu Take 1 capsule (75 mg total) by mouth daily.   oxyCODONE-acetaminophen 10-325 MG tablet Commonly known as:  PERCOCET Take 1 tablet by mouth every 8 (eight) hours as needed.   potassium chloride SA 20 MEQ tablet Commonly known as:  K-DUR,KLOR-CON Take 1 tablet (20 mEq total) by mouth 2 (two) times daily.   pyridoxine 100 MG tablet Commonly known as:  B-6 Take 100 mg by mouth daily.   RA Vitamin B-12 TR 1000 MCG Tbcr Generic drug:  Cyanocobalamin Take 1 tablet by mouth daily.   rivaroxaban 10 MG Tabs tablet Commonly known as:  XARELTO Take 1 tablet (10 mg total) by mouth daily with supper.   traMADol 50 MG tablet Commonly known as:  ULTRAM Take 50 mg by mouth as needed.        Allergies:  Allergies  Allergen Reactions  . Budesonide-Formoterol Fumarate Itching and Other (See Comments)  . Codeine Itching  . Hydrocodone-Acetaminophen Itching  . Mushroom Extract Complex Nausea And Vomiting and Other (See Comments)    Only shitake mushroom  causes this reaction. Flu-like symptoms from portabello mushrooms    Past Medical History, Surgical history, Social history, and Family History were reviewed and updated.  Review of Systems: Review of Systems  Respiratory: Positive for cough.   Musculoskeletal: Positive for back pain.  All other systems reviewed and are negative.    Physical Exam:  weight is 208 lb (94.3 kg). His oral temperature is 97.9 F (36.6 C). His blood pressure is 116/59 (abnormal) and his pulse is 63. His respiration is 16 and oxygen saturation is 98%.   Wt Readings from Last 3 Encounters:  04/23/18 208 lb (94.3 kg)  03/08/18 217 lb 12.8 oz (98.8 kg)  01/30/18 217 lb (98.4 kg)    Physical Exam Vitals signs reviewed.  HENT:     Head: Normocephalic and atraumatic.  Eyes:     Pupils: Pupils are equal, round, and reactive to light.  Neck:     Musculoskeletal: Normal range of motion.  Cardiovascular:     Rate and Rhythm: Normal rate and regular rhythm.     Heart sounds: Normal heart sounds.  Pulmonary:     Effort: Pulmonary effort is normal.     Breath sounds: Normal breath sounds.  Abdominal:     General: Bowel sounds are normal.     Palpations: Abdomen is soft.  Musculoskeletal: Normal range of motion.        General: No tenderness or deformity.  Lymphadenopathy:     Cervical: No cervical adenopathy.  Skin:    General: Skin is warm and dry.     Findings: No erythema or rash.  Neurological:     Mental Status: He is alert and oriented to person, place, and time.  Psychiatric:        Behavior: Behavior normal.        Thought Content: Thought content normal.        Judgment: Judgment normal.      Lab Results    Component Value Date   WBC 6.8 04/23/2018   HGB 12.9 (L) 04/23/2018   HCT 39.4 04/23/2018   MCV 92.3 04/23/2018   PLT 166 04/23/2018   Lab Results  Component Value Date   FERRITIN 42 03/08/2018   IRON 91 03/08/2018   TIBC 348 03/08/2018   UIBC 257 03/08/2018  IRONPCTSAT 26 03/08/2018   Lab Results  Component Value Date   RETICCTPCT 1.3 02/02/2018   RBC 4.27 04/23/2018   Lab Results  Component Value Date   KPAFRELGTCHN 10.7 03/08/2018   LAMBDASER 8.2 03/08/2018   KAPLAMBRATIO 1.30 03/08/2018   Lab Results  Component Value Date   IGGSERUM 843 03/08/2018   IGA 50 (L) 03/08/2018   IGMSERUM 42 03/08/2018   Lab Results  Component Value Date   TOTALPROTELP 6.1 03/08/2018   ALBUMINELP 3.6 03/08/2018   A1GS 0.2 03/08/2018   A2GS 0.7 03/08/2018   BETS 0.8 03/08/2018   BETA2SER 0.3 02/11/2015   GAMS 0.7 03/08/2018   MSPIKE Not Observed 03/08/2018   SPEI Comment 03/14/2017     Chemistry      Component Value Date/Time   NA 140 04/23/2018 1026   NA 140 01/27/2017 1044   NA 141 06/05/2015 1036   K 3.3 (L) 04/23/2018 1026   K 3.3 01/27/2017 1044   K 2.9 (LL) 06/05/2015 1036   CL 103 04/23/2018 1026   CL 101 01/27/2017 1044   CO2 29 04/23/2018 1026   CO2 28 01/27/2017 1044   CO2 27 06/05/2015 1036   BUN 17 04/23/2018 1026   BUN 14 01/27/2017 1044   BUN 13.1 06/05/2015 1036   CREATININE 1.25 (H) 04/23/2018 1026   CREATININE 1.59 (H) 10/06/2017 1215   CREATININE 1.0 06/05/2015 1036   GLU 152 03/30/2016      Component Value Date/Time   CALCIUM 9.2 04/23/2018 1026   CALCIUM 8.9 01/27/2017 1044   CALCIUM 9.4 06/05/2015 1036   ALKPHOS 81 04/23/2018 1026   ALKPHOS 126 (H) 01/27/2017 1044   ALKPHOS 94 06/05/2015 1036   AST 15 04/23/2018 1026   AST 22 06/05/2015 1036   ALT 26 04/23/2018 1026   ALT 33 01/27/2017 1044   ALT 18 06/05/2015 1036   BILITOT 0.6 04/23/2018 1026   BILITOT 0.59 06/05/2015 1036      Impression and Plan: [name] [name] is a very pleasant  78 yo caucasian gentleman with history of IgG kappa myeloma and ASCT at [facility] in February 2017.   I would have to think that his myeloma is still in remission.  He will have his Zometa today.  We will have him come back in 6 more weeks.  Typically we follow him every 6 weeks.  I am just happy that his knee surgery went as well as it did.    [name], MD 3/9/202011:20 AM

## 2018-04-23 NOTE — Patient Instructions (Signed)

## 2018-04-24 ENCOUNTER — Encounter: Payer: Self-pay | Admitting: Hematology & Oncology

## 2018-04-24 LAB — IGG, IGA, IGM
IGM (IMMUNOGLOBULIN M), SRM: 40 mg/dL (ref 15–143)
IgA: 51 mg/dL — ABNORMAL LOW (ref 61–437)
IgG (Immunoglobin G), Serum: 889 mg/dL (ref 700–1600)

## 2018-04-24 LAB — KAPPA/LAMBDA LIGHT CHAINS
Kappa free light chain: 8.9 mg/L (ref 3.3–19.4)
Kappa, lambda light chain ratio: 1.11 (ref 0.26–1.65)
Lambda free light chains: 8 mg/L (ref 5.7–26.3)

## 2018-04-24 LAB — FERRITIN: Ferritin: 56 ng/mL (ref 24–336)

## 2018-04-24 LAB — PROTEIN ELECTROPHORESIS, SERUM, WITH REFLEX
A/G Ratio: 1.4 (ref 0.7–1.7)
Albumin ELP: 3.6 g/dL (ref 2.9–4.4)
Alpha-1-Globulin: 0.2 g/dL (ref 0.0–0.4)
Alpha-2-Globulin: 0.8 g/dL (ref 0.4–1.0)
Beta Globulin: 0.8 g/dL (ref 0.7–1.3)
GAMMA GLOBULIN: 0.8 g/dL (ref 0.4–1.8)
Globulin, Total: 2.6 g/dL (ref 2.2–3.9)
Total Protein ELP: 6.2 g/dL (ref 6.0–8.5)

## 2018-04-24 LAB — IRON AND TIBC
Iron: 97 ug/dL (ref 42–163)
Saturation Ratios: 31 % (ref 20–55)
TIBC: 315 ug/dL (ref 202–409)
UIBC: 218 ug/dL (ref 117–376)

## 2018-04-24 LAB — TSH: TSH: 5.453 u[IU]/mL — ABNORMAL HIGH (ref 0.320–4.118)

## 2018-05-03 ENCOUNTER — Encounter: Payer: Self-pay | Admitting: Hematology & Oncology

## 2018-05-03 ENCOUNTER — Other Ambulatory Visit: Payer: Self-pay | Admitting: *Deleted

## 2018-05-03 MED ORDER — FAMCICLOVIR 500 MG PO TABS: 500.0000 mg | ORAL_TABLET | Freq: Every day | ORAL | 3 refills | Status: DC

## 2018-05-14 ENCOUNTER — Other Ambulatory Visit: Payer: Self-pay | Admitting: Sports Medicine

## 2018-05-14 DIAGNOSIS — E039 Hypothyroidism, unspecified: Secondary | ICD-10-CM

## 2018-05-14 MED ORDER — LEVOTHYROXINE SODIUM 88 MCG PO TABS: 88.0000 ug | ORAL_TABLET | Freq: Every day | ORAL | 3 refills | Status: DC

## 2018-05-21 ENCOUNTER — Ambulatory Visit: Payer: Medicare Other

## 2018-05-21 NOTE — Progress Notes (Signed)
Virtual Visit via Telephone Note  I connected with [name] [name] on 05/22/18 at 11:00 AM EDT by telephone and verified that I am speaking with the correct person using two identifiers.   I discussed the limitations, risks, security and privacy concerns of performing an evaluation and management service by telephone and the availability of in person appointments. I also discussed with the patient that there may be a patient responsible charge related to this service. The patient expressed understanding and agreed to proceed.   History of Present Illness: 78 year old former smoker followed in our office for pulmonary nodules as well as COPD.  No spirometry available in our system. Past medical history: History of multiple myeloma and bone marrow transplant at [facility] in 2/17, pulmonary embolism and DVT on lifelong anticoagulation, obstructive sleep apnea on CPAP Pt of Dr. Lamonte Sakai   Patient consented to consult via telephone:Yes  People present and their role in pt care: Pt   Chief complaint: COPD / Hx of Pulmonary Nodules   78 year old male former smoker completing a tele-visit with our office today.  Patient reports that he has had increased cough as well as congestion.  He reports that this cough is occasionally worse when lying flat.  Patient feels that he is having an infection.  He reports that the sputum color has changed and is a lighter yellow to darker yellow from clear.  Patient with known bronchiectasis not currently using a flutter valve.  He reports he has a flutter valve at home but it is over 5 years old and he has not been using that regularly.  He reports adherence to his Nexium, and Zyrtec.  Patient has not been taking his Singulair since last office visit when it was decided that he could be trialed on just Zyrtec.  He reports he has had the usual allergy-like symptoms right now but he does not believe a VATS which is driving his cough.  He feels that he needs an  antibiotic.  Patient with diagnosis of COPD but we do not have formal pulmonary function testing on him.  He reports that he had pulmonary function testing done many years ago at other offices.  Patient also has obstructive sleep apnea which has been well managed.  CPAP compliance report shows excellent compliance.  CPAP compliance report listed below:  04/21/2018-05/20/2018-CPAP compliance report-30 out of 30 days use, all 30 those days greater than 4 hours, average usage 7 hours and 33 minutes, CPAP set pressure of 10, AHI 0.6  See cough ROS listed below  05/22/2018 - Cough ROS:   When to the symptoms start: 6 months  How are you today: same, maybe worse   Have you had fever/sore throat (first 5 to 7 days of URI) or Have you had cough/nasal congestion (10 to 14 days of URI) : Cough  Have you used anything to treat the cough, as anything improved : Antibiotics like Z-Paks Is it a dry or wet cough: wet cough Does the cough happen when your breathing or when you breathe out: Unsure Other any triggers to your cough, or any aggravating factors: throughout the day   Daily antihistamine: Zyrtec  GERD treatment: Nexium Singulair: None   Cough checklist (bolded indicates presence):  Adherence - none , acid reflux, ACE inhibitor, active sinus disease, active smoking, adverse effects of medications (amiodarone/Macrodantin/bb), alpha 1, allergies, aspiration - none , anxiety, bronchiectasis, congestive heart failure (diastolic)     Observations/Objective:  09/18/2017-CT chest without contrast- new small left  pleural effusion and small pericardial effusion noted, stable appearance of left lower lobe lung nodule this is been stable for 18 months and is compatible with a benign abnormality no further follow-up is indicated at this time, similar appearance of bilateral areas of bronchiectasis, LAD, left circumflex and RCA coronary artery arthrosclerosis calcifications, aortic arthrosclerosis, small hiatal  hernia  04/20/2016-echocardiogram-LV ejection fraction 60 to 65%, heart function is normal, pulmonary pressures normal  No results found for: NITRICOXIDE    Assessment and Plan:  Asthma with COPD Assessment: Patient reports he has a diagnosis of COPD No formal PFTs that I can visually look at today Worsening shortness of breath with occasional wheezing and increased cough  Plan: We will treat as bronchiectasis exacerbation Doxycycline today Prednisone taper Continue rescue inhaler as needed Continue Zyrtec 43-monthfollow-up with Dr. BLamonte Sakaiwith formal PFTs  [place] and [place]   Plan: Continue chronic anticoagulation  Bronchiectasis with acute exacerbation ([facility] Assessment: August/2019 CT chest showing stable bronchiectasis Patient is not currently using a flutter valve Patient reports that he has a more of a productive cough with yellow sputum Patient denies fever body aches or chills Patient reports that he has been wheezing more  Plan: Doxycycline today Prednisone taper today Consider at next office visit instructed how to use flutter valve Continue Zyrtec daily Continue Nexium  GERD (gastroesophageal reflux disease) Assessment: Patient reports well-controlled acid reflux-like symptoms Patient reports occasionally maybe once a month he has a flare of his acid reflux typically after he eats onions Patient reports adherence to PPI  Plan: Continue Nexium 40 mg   Obstructive sleep apnea Assessment: CPAP compliance report shows excellent compliance  Plan: Continue CPAP as prescribed    Follow Up Instructions:  Return in about 3 months (around 08/21/2018), or if symptoms worsen or fail to improve, for Follow up with Dr. BLamonte Sakai Follow up for PFT.    I discussed the assessment and treatment plan with the patient. The patient was provided an opportunity to ask questions and all were answered. The patient agreed with the plan and  demonstrated an understanding of the instructions.   The patient was advised to call back or seek an in-person evaluation if the symptoms worsen or if the condition fails to improve as anticipated.  I provided 25 minutes of non-face-to-face time during this encounter.   BLauraine Rinne NP

## 2018-05-22 ENCOUNTER — Ambulatory Visit (INDEPENDENT_AMBULATORY_CARE_PROVIDER_SITE_OTHER): Payer: Medicare Other | Admitting: Pulmonary Disease

## 2018-05-22 ENCOUNTER — Other Ambulatory Visit: Payer: Self-pay

## 2018-05-22 ENCOUNTER — Encounter: Payer: Self-pay | Admitting: Pulmonary Disease

## 2018-05-22 DIAGNOSIS — K219 Gastro-esophageal reflux disease without esophagitis: Secondary | ICD-10-CM

## 2018-05-22 DIAGNOSIS — J449 Chronic obstructive pulmonary disease, unspecified: Secondary | ICD-10-CM | POA: Diagnosis not present

## 2018-05-22 DIAGNOSIS — G4733 Obstructive sleep apnea (adult) (pediatric): Secondary | ICD-10-CM | POA: Diagnosis not present

## 2018-05-22 DIAGNOSIS — I2699 Other pulmonary embolism without acute cor pulmonale: Secondary | ICD-10-CM

## 2018-05-22 DIAGNOSIS — J471 Bronchiectasis with (acute) exacerbation: Secondary | ICD-10-CM

## 2018-05-22 MED ORDER — DOXYCYCLINE HYCLATE 100 MG PO TABS: 100.0000 mg | ORAL_TABLET | Freq: Two times a day (BID) | ORAL | 0 refills | Status: DC

## 2018-05-22 MED ORDER — PREDNISONE 10 MG PO TABS: ORAL_TABLET | ORAL | 0 refills | Status: DC

## 2018-05-22 NOTE — Assessment & Plan Note (Signed)
Assessment: CPAP compliance report shows excellent compliance  Plan: Continue CPAP as prescribed

## 2018-05-22 NOTE — Assessment & Plan Note (Addendum)
Assessment: Patient reports he has a diagnosis of COPD No formal PFTs that I can visually look at today Worsening shortness of breath with occasional wheezing and increased cough  Plan: We will treat as bronchiectasis exacerbation Doxycycline today Prednisone taper Continue rescue inhaler as needed Continue Zyrtec 67-month follow-up with Dr. Lamonte Sakai with formal PFTs

## 2018-05-22 NOTE — Assessment & Plan Note (Signed)
Assessment: Patient reports well-controlled acid reflux-like symptoms Patient reports occasionally maybe once a month he has a flare of his acid reflux typically after he eats onions Patient reports adherence to PPI  Plan: Continue Nexium 40 mg

## 2018-05-22 NOTE — Patient Instructions (Addendum)
Doxycycline >>> 1 100 mg tablet every 12 hours for 10 days >>>take with food  >>>wear sunscreen   Prednisone 10mg  tablet  >>>4 tabs for 2 days, then 3 tabs for 2 days, 2 tabs for 2 days, then 1 tab for 2 days, then stop >>>take with food  >>>take in the morning   Can start mucinex during the day to help  >>>600mg  tablet daily   Bronchiectasis: This is the medical term which indicates that you have damage, dilated airways making you more susceptible to respiratory infection. Use a flutter valve 10 breaths twice a day or 4 to 5 breaths 4-5 times a day to help clear mucus out Let us know if you have cough with change in mucus color or fevers or chills.  At that point you would need an antibiotic. Maintain a healthy nutritious diet, eating whole foods Take your medications as prescribed    Nexium 40 mg tablet  >>>Please take 1 tablet daily 15 minutes to 30 minutes before your first meal of the day as well as before your other medications >>>Try to take at the same time each day >>>take this medication daily  Note your daily symptoms > remember "red flags" for COPD:   >>>Increase in cough >>>increase in sputum production >>>increase in shortness of breath or activity  intolerance.   If you notice these symptoms, please call the office to be seen.    You need pulmonary function testing   Return in about 3 months (around 08/21/2018), or if symptoms worsen or fail to improve, for Follow up with Dr. Lamonte Sakai, Follow up for PFT.       Coronavirus (COVID-19) Are you at risk?  Are you at risk for the Coronavirus (COVID-19)?  To be considered HIGH RISK for Coronavirus (COVID-19), you have to meet the following criteria:  . Traveled to [place], [place], [place], [place] or [place]; or in the [place] to [place], [place], [place], or [place]; and have fever, cough, and shortness of breath within the last 2 weeks of travel OR . Been in close contact with a person diagnosed  with COVID-19 within the last 2 weeks and have fever, cough, and shortness of breath . IF YOU DO NOT MEET THESE CRITERIA, YOU ARE CONSIDERED LOW RISK FOR COVID-19.  What to do if you are HIGH RISK for COVID-19?  Marland Kitchen If you are having a medical emergency, call 911. . Seek medical care right away. Before you go to a doctor's office, urgent care or emergency department, call ahead and tell them about your recent travel, contact with someone diagnosed with COVID-19, and your symptoms. You should receive instructions from your physician's office regarding next steps of care.  . When you arrive at healthcare provider, tell the healthcare staff immediately you have returned from visiting [place], [place], [place], [place] or [place]; or traveled in the [place] to [place], [place], [place], or [place]; in the last two weeks or you have been in close contact with a person diagnosed with COVID-19 in the last 2 weeks.   . Tell the health care staff about your symptoms: fever, cough and shortness of breath. . After you have been seen by a medical provider, you will be either: o Tested for (COVID-19) and discharged home on quarantine except to seek medical care if symptoms worsen, and asked to  - Stay home and avoid contact with others until you get your results (4-5 days)  - Avoid travel  on public transportation if possible (such as bus, train, or airplane) or o Sent to the Emergency Department by EMS for evaluation, COVID-19 testing, and possible admission depending on your condition and test results.  What to do if you are LOW RISK for COVID-19?  Reduce your risk of any infection by using the same precautions used for avoiding the common cold or flu:  Marland Kitchen Wash your hands often with soap and warm water for at least 20 seconds.  If soap and water are not readily available, use an alcohol-based hand sanitizer with at least 60% alcohol.  . If coughing or sneezing, cover your mouth and nose by coughing  or sneezing into the elbow areas of your shirt or coat, into a tissue or into your sleeve (not your hands). . Avoid shaking hands with others and consider head nods or verbal greetings only. . Avoid touching your eyes, nose, or mouth with unwashed hands.  . Avoid close contact with people who are sick. . Avoid places or events with large numbers of people in one location, like concerts or sporting events. . Carefully consider travel plans you have or are making. . If you are planning any travel outside or inside the [place], visit the CDC's Travelers' Health webpage for the latest health notices. . If you have some symptoms but not all symptoms, continue to monitor at home and seek medical attention if your symptoms worsen. . If you are having a medical emergency, call 911.   [place] / e-Visit: [email or web address]         [facility]: [facility]: [phone]                   [facility]: [phone]           It is flu season:   >>> Best ways to protect herself from the flu: Receive the yearly flu vaccine, practice good hand hygiene washing with soap and also using hand sanitizer when available, eat a nutritious meals, get adequate rest, hydrate appropriately   Please contact the office if your symptoms worsen or you have concerns that you are not improving.   Thank you for choosing [facility] for your healthcare, and for allowing [place] to partner with you on your healthcare journey. I am thankful to be able to provide care to you today.   Wyn Quaker FNP-C

## 2018-05-22 NOTE — Assessment & Plan Note (Signed)
History of PE and DVT   Plan: Continue chronic anticoagulation

## 2018-05-22 NOTE — Assessment & Plan Note (Signed)
Assessment: August/2019 CT chest showing stable bronchiectasis Patient is not currently using a flutter valve Patient reports that he has a more of a productive cough with yellow sputum Patient denies fever body aches or chills Patient reports that he has been wheezing more  Plan: Doxycycline today Prednisone taper today Consider at next office visit instructed how to use flutter valve Continue Zyrtec daily Continue Nexium

## 2018-05-24 NOTE — Telephone Encounter (Signed)
Patient emailed [place] stating that he doesn't have a flutter valve he has a airlife. Pt would like to know if you want him to have a flutter valve. I have forward the email to you for review.  B.Mack Please advise, thank you.

## 2018-05-24 NOTE — Telephone Encounter (Signed)
05/24/2018 1645  I am more than fine to sign a prescription for the patient to get a flutter valve.  We can mail that prescription to them and they can go to a DME of their choice in order to receive that.    Please also caution the patient that this would increase the risk of COVID-19 and going out into the community.  I am okay with the patient continuing to not have a flutter valve at this time, as we are in the setting of a pandemic.  We can always further address at next office visit as we typically have flutter valves in stock in the office.  It is the patient's choice.  Wyn Quaker, FNP

## 2018-06-04 ENCOUNTER — Other Ambulatory Visit: Payer: Medicare Other

## 2018-06-04 ENCOUNTER — Ambulatory Visit: Payer: Medicare Other | Admitting: Hematology & Oncology

## 2018-06-11 ENCOUNTER — Ambulatory Visit: Payer: Medicare Other | Admitting: Hematology & Oncology

## 2018-06-11 ENCOUNTER — Other Ambulatory Visit: Payer: Medicare Other

## 2018-06-11 ENCOUNTER — Ambulatory Visit: Payer: Medicare Other | Admitting: Emergency Medicine

## 2018-06-13 ENCOUNTER — Ambulatory Visit: Payer: Medicare Other | Admitting: Hematology & Oncology

## 2018-06-13 ENCOUNTER — Other Ambulatory Visit: Payer: Medicare Other

## 2018-06-15 ENCOUNTER — Encounter: Payer: Self-pay | Admitting: Sports Medicine

## 2018-06-18 ENCOUNTER — Telehealth (INDEPENDENT_AMBULATORY_CARE_PROVIDER_SITE_OTHER): Payer: Medicare Other | Admitting: Sports Medicine

## 2018-06-18 ENCOUNTER — Encounter: Payer: Self-pay | Admitting: Sports Medicine

## 2018-06-18 DIAGNOSIS — J471 Bronchiectasis with (acute) exacerbation: Secondary | ICD-10-CM | POA: Diagnosis not present

## 2018-06-18 MED ORDER — AEROBIKA DEVI: 1.0000 | Freq: Every day | 0 refills | Status: DC

## 2018-06-18 NOTE — Assessment & Plan Note (Signed)
Bronchiectasis, acute exacerbation, increased coughing, productive of sputum, he was seen by his pulmonology PA, prescribed doxycycline, prednisone. Delsym seems to be helping, no fevers, chills, shortness of breath, chest pain. He still has significant productive sputum so I am going to add a chest PT device. Continue inhalers. He will come back to see me in the office for a wellness visit in June some time.

## 2018-06-18 NOTE — Progress Notes (Signed)
Virtual Visit via WebEx/MyChart   I connected with  [name] [name]  on 06/18/18 via WebEx/MyChart/Doximity Video and verified that I am speaking with the correct person using two identifiers.   I discussed the limitations, risks, security and privacy concerns of performing an evaluation and management service by WebEx/MyChart/Doximity Video, including the higher likelihood of inaccurate diagnosis and treatment, and the availability of in person appointments.  We also discussed the likely need of an additional face to face encounter for complete and high quality delivery of care.  I also discussed with the patient that there may be a patient responsible charge related to this service. The patient expressed understanding and wishes to proceed.  Provider location is either at home or medical facility. Patient location is at their home, different from provider location. People involved in care of the patient during this telehealth encounter were myself, my nurse/medical assistant, and my front office/scheduling team member.  Subjective:    CC: Feeling sick  HPI: [name] [name] is a pleasant 78 year old male with a history of bronchiectasis, multiple myeloma post bone marrow transplant, COPD.  He has had some upper respiratory and lower respiratory symptoms including cough, mild malaise, cough was productive of greenish sputum.  He did a virtual visit with his pulmonologist, they prescribed doxycycline and prednisone.  He improved to some degree, and residual symptoms were improved considerably with over-the-counter antitussives and cold and flu medications.  Unfortunately 2 to 3 weeks have passed and he continues to have productive sputum.  He has not yet tried a home chest PT device.  No fevers, chills, chest pain, no shortness of breath, no GI symptoms, no skin rash.  I reviewed the past medical history, family history, social history, surgical history, and allergies today and no changes were needed.   Please see the problem list section below in epic for further details.  Past Medical History: Past Medical History:  Diagnosis Date   Acute deep vein thrombosis (DVT) of tibial vein of right lower extremity (HCC) 09/29/2014   Arthritis    back   Asthma    uses Advair bid and asmanex prn   Cancer (HCC)    multiple myeloma   Chronic back pain    buldging disc and scoliosis   Chronic bronchitis (HCC)    couple of yrs ago   Complication of anesthesia    severe case of hiccups after hernia surgery;required medication   Diverticulosis    Dizziness    related to neck issues   Emphysema    Enlarged prostate    takes Flomax daily   GERD (gastroesophageal reflux disease)    takes PRotonix daily   History of colon polyps    Hyperlipidemia    takes Zocor daily   Hypertension    takes HCTZ,Diovan daily and Isosorbide daily   Itching    down left arm and leg   Joint pain    Macular degeneration    mild,beginning   Multiple myeloma ([place]) 08/13/2014   Myocardial infarction ([place]) 2010   Neck pain    Pain in limb 06/20/2012   Pneumonia    hx of---12+yrs ago   Past Surgical History: Past Surgical History:  Procedure Laterality Date   ANTERIOR CERVICAL DECOMP/DISCECTOMY FUSION  01/31/2012   Procedure: ANTERIOR CERVICAL DECOMPRESSION/DISCECTOMY FUSION 1 LEVEL;  Surgeon: Floyce Stakes, MD;  Location: [facility];  Service: Neurosurgery;  Laterality: N/A;  Cervical Five-Six  Anterior Cervical Decompression /Diskectomy /Fusion/Plate   BONE MARROW TRANSPLANT  CARDIAC CATHETERIZATION  2010/2013   06/07/11 [facility]) LAD 25%, D2 75% (small), pLCx 100%, RCA 25%--medical managment    COLONOSCOPY     HERNIA REPAIR  2006   x 3    salivary gland removed     left    skin cancer removed from left side of face     basal cell carcinoma   vein removed from left leg     Social History: Social History   Socioeconomic History   Marital status: Married    Spouse  name: Not on file   Number of children: 3   Years of education: Not on file   Highest education level: Not on file  Occupational History   Not on file  Social Needs   Financial resource strain: Not on file   Food insecurity:    Worry: Not on file    Inability: Not on file   Transportation needs:    Medical: Not on file    Non-medical: Not on file  Tobacco Use   Smoking status: Former Smoker    Years: 30.00    Types: Pipe    Last attempt to quit: 11/24/2011    Years since quitting: 6.5   Smokeless tobacco: Former Systems developer    Quit date: 02/15/2011   Tobacco comment: quit tobacco 3 years ago  Substance and Sexual Activity   Alcohol use: Yes    Alcohol/week: 0.0 standard drinks    Comment: 1-2 glasses of wine daily   Drug use: No   Sexual activity: Yes  Lifestyle   Physical activity:    Days per week: Not on file    Minutes per session: Not on file   Stress: Not on file  Relationships   Social connections:    Talks on phone: Not on file    Gets together: Not on file    Attends religious service: Not on file    Active member of club or organization: Not on file    Attends meetings of clubs or organizations: Not on file    Relationship status: Not on file  Other Topics Concern   Not on file  Social History Narrative   Not on file   Family History: Family History  Problem Relation Age of Onset   Hypertension Mother    Heart attack Father    Cancer Sister    Cancer Sister    Allergies: Allergies  Allergen Reactions   Budesonide-Formoterol Fumarate Itching and Other (See Comments)   Codeine Itching   Hydrocodone-Acetaminophen Itching   Mushroom Extract Complex Nausea And Vomiting and Other (See Comments)    Only shitake mushroom  causes this reaction. Flu-like symptoms from portabello mushrooms   Medications: See med rec.  Review of Systems: No fevers, chills, night sweats, weight loss, chest pain, or shortness of breath.   Objective:      General: Speaking full sentences, no audible heavy breathing.  Sounds alert and appropriately interactive.  Appears well.  Face symmetric.  Extraocular movements intact.  Pupils equal and round.  No nasal flaring or accessory muscle use visualized.  No other physical exam performed due to the non-physical nature of this visit.  Impression and Recommendations:    Bronchiectasis with acute exacerbation (HCC) Bronchiectasis, acute exacerbation, increased coughing, productive of sputum, he was seen by his pulmonology PA, prescribed doxycycline, prednisone. Delsym seems to be helping, no fevers, chills, shortness of breath, chest pain. He still has significant productive sputum so I am going to add a chest  PT device. Continue inhalers. He will come back to see me in the office for a wellness visit in June some time.  I discussed the above assessment and treatment plan with the patient. The patient was provided an opportunity to ask questions and all were answered. The patient agreed with the plan and demonstrated an understanding of the instructions.   The patient was advised to call back or seek an in-person evaluation if the symptoms worsen or if the condition fails to improve as anticipated.   I provided 25 minutes of non-face-to-face time during this encounter, 15 minutes of additional time was needed to gather information, review chart, records, communicate/coordinate with staff remotely, troubleshooting the multiple errors that we get every time when trying to do video calls through the electronic medical record, WebEx, and Doximity, restart the encounter multiple times due to instability of the software, as well as complete documentation.   ___________________________________________ Gwen Her. Dianah Field, M.D., ABFM., CAQSM. Primary Care and Sports Medicine [facility]  Adjunct Professor of [place] [employer] [facility] [employer]

## 2018-06-20 ENCOUNTER — Ambulatory Visit: Payer: Medicare Other

## 2018-06-21 ENCOUNTER — Encounter: Payer: Self-pay | Admitting: Sports Medicine

## 2018-06-21 DIAGNOSIS — J471 Bronchiectasis with (acute) exacerbation: Secondary | ICD-10-CM

## 2018-06-21 MED ORDER — AEROBIKA DEVI: 1.0000 | Freq: Every day | 0 refills | Status: DC

## 2018-06-21 NOTE — Telephone Encounter (Signed)
Can you refax this today?

## 2018-06-21 NOTE — Telephone Encounter (Signed)
Please fax the Rx for the Aerobika Chest PT device to the patient's [facility].  I faxed it earlier this week but they didn't get it for some reason.

## 2018-06-22 ENCOUNTER — Other Ambulatory Visit: Payer: Self-pay | Admitting: *Deleted

## 2018-06-22 ENCOUNTER — Telehealth: Payer: Self-pay

## 2018-06-22 DIAGNOSIS — J471 Bronchiectasis with (acute) exacerbation: Secondary | ICD-10-CM

## 2018-06-22 MED ORDER — AEROBIKA DEVI: 1.0000 | Freq: Every day | 0 refills | Status: DC

## 2018-06-22 NOTE — Telephone Encounter (Signed)
[facility] called and states they do not carry DME supplies.

## 2018-06-22 NOTE — Telephone Encounter (Signed)
Go ahead and have Renard check with a local medical supply store such as [facility], the prescription is here and we can fax it wherever.

## 2018-06-26 NOTE — Telephone Encounter (Signed)
I have not seen this order. Do you have it? Can someone fax it since I do not have access to a fax? Please advise.

## 2018-06-26 NOTE — Telephone Encounter (Signed)
He found it somewhere else, no further intervention needed.

## 2018-07-02 ENCOUNTER — Other Ambulatory Visit: Payer: Self-pay | Admitting: Sports Medicine

## 2018-07-18 ENCOUNTER — Other Ambulatory Visit: Payer: Self-pay | Admitting: *Deleted

## 2018-07-18 ENCOUNTER — Other Ambulatory Visit: Payer: Self-pay

## 2018-07-18 ENCOUNTER — Inpatient Hospital Stay: Payer: Medicare Other | Attending: Hematology & Oncology

## 2018-07-18 ENCOUNTER — Inpatient Hospital Stay (HOSPITAL_BASED_OUTPATIENT_CLINIC_OR_DEPARTMENT_OTHER): Payer: Medicare Other | Admitting: Hematology & Oncology

## 2018-07-18 ENCOUNTER — Inpatient Hospital Stay: Payer: Medicare Other

## 2018-07-18 ENCOUNTER — Encounter: Payer: Self-pay | Admitting: Hematology & Oncology

## 2018-07-18 VITALS — BP 134/73 | HR 56 | Temp 98.1°F | Resp 19 | Wt 217.0 lb

## 2018-07-18 DIAGNOSIS — C9000 Multiple myeloma not having achieved remission: Secondary | ICD-10-CM | POA: Diagnosis not present

## 2018-07-18 DIAGNOSIS — M899 Disorder of bone, unspecified: Secondary | ICD-10-CM

## 2018-07-18 DIAGNOSIS — I82401 Acute embolism and thrombosis of unspecified deep veins of right lower extremity: Secondary | ICD-10-CM

## 2018-07-18 DIAGNOSIS — Z7901 Long term (current) use of anticoagulants: Secondary | ICD-10-CM

## 2018-07-18 DIAGNOSIS — D500 Iron deficiency anemia secondary to blood loss (chronic): Secondary | ICD-10-CM

## 2018-07-18 DIAGNOSIS — C9001 Multiple myeloma in remission: Secondary | ICD-10-CM

## 2018-07-18 LAB — CBC WITH DIFFERENTIAL (CANCER CENTER ONLY)
Abs Immature Granulocytes: 0.01 10*3/uL (ref 0.00–0.07)
Basophils Absolute: 0 10*3/uL (ref 0.0–0.1)
Basophils Relative: 1 %
Eosinophils Absolute: 0.1 10*3/uL (ref 0.0–0.5)
Eosinophils Relative: 2 %
HCT: 41.1 % (ref 39.0–52.0)
Hemoglobin: 13.7 g/dL (ref 13.0–17.0)
Immature Granulocytes: 0 %
Lymphocytes Relative: 34 %
Lymphs Abs: 2.1 10*3/uL (ref 0.7–4.0)
MCH: 30.2 pg (ref 26.0–34.0)
MCHC: 33.3 g/dL (ref 30.0–36.0)
MCV: 90.7 fL (ref 80.0–100.0)
Monocytes Absolute: 0.6 10*3/uL (ref 0.1–1.0)
Monocytes Relative: 10 %
Neutro Abs: 3.3 10*3/uL (ref 1.7–7.7)
Neutrophils Relative %: 53 %
Platelet Count: 191 10*3/uL (ref 150–400)
RBC: 4.53 MIL/uL (ref 4.22–5.81)
RDW: 14.4 % (ref 11.5–15.5)
WBC Count: 6.2 10*3/uL (ref 4.0–10.5)
nRBC: 0 % (ref 0.0–0.2)

## 2018-07-18 LAB — CMP (CANCER CENTER ONLY)
ALT: 63 U/L — ABNORMAL HIGH (ref 0–44)
AST: 52 U/L — ABNORMAL HIGH (ref 15–41)
Albumin: 4.3 g/dL (ref 3.5–5.0)
Alkaline Phosphatase: 117 U/L (ref 38–126)
Anion gap: 8 (ref 5–15)
BUN: 14 mg/dL (ref 8–23)
CO2: 28 mmol/L (ref 22–32)
Calcium: 9 mg/dL (ref 8.9–10.3)
Chloride: 104 mmol/L (ref 98–111)
Creatinine: 1.2 mg/dL (ref 0.61–1.24)
GFR, Est AFR Am: >60 mL/min (ref >60)
GFR, Estimated: 58 mL/min — ABNORMAL LOW (ref >60)
Glucose, Bld: 70 mg/dL (ref 70–99)
Potassium: 3.6 mmol/L (ref 3.5–5.1)
Sodium: 140 mmol/L (ref 135–145)
Total Bilirubin: 0.8 mg/dL (ref 0.3–1.2)
Total Protein: 6.8 g/dL (ref 6.5–8.1)

## 2018-07-18 MED ORDER — FUSION PLUS PO CAPS: 1.0000 | ORAL_CAPSULE | Freq: Every day | ORAL | 4 refills | Status: DC

## 2018-07-18 MED ORDER — ZOLEDRONIC ACID 4 MG/100ML IV SOLN
4.0000 mg | Freq: Once | INTRAVENOUS | Status: AC
  Administered 2018-07-18: 4 mg via INTRAVENOUS
  Filled 2018-07-18: qty 100

## 2018-07-18 MED ORDER — SODIUM CHLORIDE 0.9 % IV SOLN
Freq: Once | INTRAVENOUS | Status: AC
  Administered 2018-07-18: 15:00:00 via INTRAVENOUS
  Filled 2018-07-18: qty 250

## 2018-07-18 NOTE — Progress Notes (Signed)
Hematology and Oncology Follow Up Visit  [name] [name] [id number] 1940-05-17 78 y.o. 07/18/2018   Principle Diagnosis:  IgG kappa myeloma - Trisomy 11 by FISH Acute thromboembolism of the right lower leg  Current Therapy:   Revlimid 10mg  po q day (21/28) -- d/c on 09/15/2017 Zometa 4 mg IV every 3 months  - next dose in - 10/2018 Xarelto 10 mg by mouth daily  Past Therapy:  Patient s/p cycle 5 of Velcade/Revlimid/Decadron S/P ASCT at [place] on 04/02/2015   Interim History:  [name] [name] is here today for follow-up.  He is doing pretty well.  He really has no specific complaints.  He has had no issues with nausea or vomiting.  His main problem has been this congestion that he has.  He does see pulmonary medicine for this.  He I think we will see his pulmonologist in July or August.  His wife, who is from [place], is very worried about the effect of the coronavirus on [place].  It sounds like it is a bad down there with a lot of deaths.  He has had no problems with bleeding.  He has had no issues with his bowels or bladder.  He and his wife have oatmeal for dinner and this really has helped him.  His myeloma has not been a problem.  There is no monoclonal spike in his blood.  His IgG level is 889 mg/dL.  His kappa light chain was 0.9 mg/dL.  This was back in March.  Overall, his performance status is ECOG 1.  Medications:  Allergies as of 07/18/2018      Reactions   Budesonide-formoterol Fumarate Itching, Other (See Comments)   Codeine Itching   Hydrocodone-acetaminophen Itching   Mushroom Extract Complex Nausea And Vomiting, Other (See Comments)   Only shitake mushroom  causes this reaction. Flu-like symptoms from portabello mushrooms      Medication List       Accurate as of July 18, 2018  2:29 PM. If you have any questions, ask your nurse or doctor.        [name] [name] 1 each by Does not apply route 5 (five) times daily. Use frequently, at least 5x a day.   amLODipine 5  MG tablet Commonly known as:  NORVASC Take 1 tablet (5 mg total) by mouth daily.   aspirin EC 81 MG tablet Take by mouth.   carvedilol 6.25 MG tablet Commonly known as:  COREG Take 1 tablet (6.25 mg total) by mouth 2 (two) times daily with a meal.   cetirizine 10 MG tablet Commonly known as:  ZyrTEC Allergy Take 1 tablet (10 mg total) by mouth daily.   esomeprazole 40 MG capsule Commonly known as:  NEXIUM Take 1 capsule (40 mg total) by mouth daily at 12 noon.   famciclovir 500 MG tablet Commonly known as:  FAMVIR Take 1 tablet (500 mg total) by mouth daily.   Fusion Plus Caps Take 1 tablet by mouth daily.   gabapentin 600 MG tablet Commonly known as:  NEURONTIN TAKE 1 TABLET EVERY MORNING  AND TAKE 2 TABLETS EVERY NIGHT   hydrochlorothiazide 12.5 MG capsule Commonly known as:  MICROZIDE Take 1 capsule (12.5 mg total) by mouth daily.   isosorbide mononitrate 30 MG 24 hr tablet Commonly known as:  IMDUR Take 1 tablet (30 mg total) by mouth 2 (two) times daily.   Klor-Con M20 20 MEQ tablet Generic drug:  potassium chloride SA TAKE 1 TABLET TWICE A DAY  levothyroxine 88 MCG tablet Commonly known as:  SYNTHROID Take 1 tablet (88 mcg total) by mouth daily before breakfast.   LORazepam 1 MG tablet Commonly known as:  ATIVAN Take 1 tablet (1 mg total) by mouth at bedtime.   nitroGLYCERIN 0.4 MG SL tablet Commonly known as:  NITROSTAT Place 0.4 mg under the tongue every 5 (five) minutes as needed. For chest pain   oxyCODONE-acetaminophen 10-325 MG tablet Commonly known as:  PERCOCET Take 1 tablet by mouth every 8 (eight) hours as needed.   pyridoxine 100 MG tablet Commonly known as:  B-6 Take 100 mg by mouth daily.   RA Vitamin B-12 TR 1000 MCG Tbcr Generic drug:  Cyanocobalamin Take 1 tablet by mouth daily.   rivaroxaban 10 MG Tabs tablet Commonly known as:  XARELTO Take 1 tablet (10 mg total) by mouth daily with supper.   traMADol 50 MG tablet  Commonly known as:  ULTRAM Take 50 mg by mouth as needed.       Allergies:  Allergies  Allergen Reactions  . Budesonide-Formoterol Fumarate Itching and Other (See Comments)  . Codeine Itching  . Hydrocodone-Acetaminophen Itching  . Mushroom Extract Complex Nausea And Vomiting and Other (See Comments)    Only shitake mushroom  causes this reaction. Flu-like symptoms from portabello mushrooms    Past Medical History, Surgical history, Social history, and Family History were reviewed and updated.  Review of Systems: Review of Systems  Respiratory: Positive for cough.   Musculoskeletal: Positive for back pain.  All other systems reviewed and are negative.    Physical Exam:  weight is 217 lb (98.4 kg). His oral temperature is 98.1 F (36.7 C). His blood pressure is 134/73 and his pulse is 56 (abnormal). His respiration is 19 and oxygen saturation is 98%.   Wt Readings from Last 3 Encounters:  07/18/18 217 lb (98.4 kg)  04/23/18 208 lb (94.3 kg)  03/08/18 217 lb 12.8 oz (98.8 kg)    Physical Exam Vitals signs reviewed.  HENT:     Head: Normocephalic and atraumatic.  Eyes:     Pupils: Pupils are equal, round, and reactive to light.  Neck:     Musculoskeletal: Normal range of motion.  Cardiovascular:     Rate and Rhythm: Normal rate and regular rhythm.     Heart sounds: Normal heart sounds.  Pulmonary:     Effort: Pulmonary effort is normal.     Breath sounds: Normal breath sounds.  Abdominal:     General: Bowel sounds are normal.     Palpations: Abdomen is soft.  Musculoskeletal: Normal range of motion.        General: No tenderness or deformity.  Lymphadenopathy:     Cervical: No cervical adenopathy.  Skin:    General: Skin is warm and dry.     Findings: No erythema or rash.  Neurological:     Mental Status: He is alert and oriented to person, place, and time.  Psychiatric:        Behavior: Behavior normal.        Thought Content: Thought content normal.         Judgment: Judgment normal.      Lab Results  Component Value Date   WBC 6.2 07/18/2018   HGB 13.7 07/18/2018   HCT 41.1 07/18/2018   MCV 90.7 07/18/2018   PLT 191 07/18/2018   Lab Results  Component Value Date   FERRITIN 56 04/23/2018   IRON 97 04/23/2018   TIBC  315 04/23/2018   UIBC 218 04/23/2018   IRONPCTSAT 31 04/23/2018   Lab Results  Component Value Date   RETICCTPCT 1.3 02/02/2018   RBC 4.53 07/18/2018   Lab Results  Component Value Date   KPAFRELGTCHN 8.9 04/23/2018   LAMBDASER 8.0 04/23/2018   KAPLAMBRATIO 1.11 04/23/2018   Lab Results  Component Value Date   IGGSERUM 889 04/23/2018   IGA 51 (L) 04/23/2018   IGMSERUM 40 04/23/2018   Lab Results  Component Value Date   TOTALPROTELP 6.2 04/23/2018   ALBUMINELP 3.6 04/23/2018   A1GS 0.2 04/23/2018   A2GS 0.8 04/23/2018   BETS 0.8 04/23/2018   BETA2SER 0.3 02/11/2015   GAMS 0.8 04/23/2018   MSPIKE Not Observed 04/23/2018   SPEI Comment 03/14/2017     Chemistry      Component Value Date/Time   NA 140 07/18/2018 1335   NA 140 01/27/2017 1044   NA 141 06/05/2015 1036   K 3.6 07/18/2018 1335   K 3.3 01/27/2017 1044   K 2.9 (LL) 06/05/2015 1036   CL 104 07/18/2018 1335   CL 101 01/27/2017 1044   CO2 28 07/18/2018 1335   CO2 28 01/27/2017 1044   CO2 27 06/05/2015 1036   BUN 14 07/18/2018 1335   BUN 14 01/27/2017 1044   BUN 13.1 06/05/2015 1036   CREATININE 1.20 07/18/2018 1335   CREATININE 1.59 (H) 10/06/2017 1215   CREATININE 1.0 06/05/2015 1036   GLU 152 03/30/2016      Component Value Date/Time   CALCIUM 9.0 07/18/2018 1335   CALCIUM 8.9 01/27/2017 1044   CALCIUM 9.4 06/05/2015 1036   ALKPHOS 117 07/18/2018 1335   ALKPHOS 126 (H) 01/27/2017 1044   ALKPHOS 94 06/05/2015 1036   AST 52 (H) 07/18/2018 1335   AST 22 06/05/2015 1036   ALT 63 (H) 07/18/2018 1335   ALT 33 01/27/2017 1044   ALT 18 06/05/2015 1036   BILITOT 0.8 07/18/2018 1335   BILITOT 0.59 06/05/2015 1036       Impression and Plan: Mr. Broyhill is a very pleasant 78 yo caucasian gentleman with history of IgG kappa myeloma and ASCT at [facility] in February 2017.   I would have to think that his myeloma is still in remission.  He will have his Zometa today.  We will have him come back in 6 more weeks.  Typically we follow him every 6 weeks.   [name], MD 6/3/20202:29 PM

## 2018-07-18 NOTE — Patient Instructions (Signed)

## 2018-07-19 ENCOUNTER — Telehealth: Payer: Self-pay | Admitting: Hematology & Oncology

## 2018-07-19 ENCOUNTER — Encounter: Payer: Self-pay | Admitting: Hematology & Oncology

## 2018-07-19 LAB — PROTEIN ELECTROPHORESIS, SERUM, WITH REFLEX
A/G Ratio: 1.3 (ref 0.7–1.7)
Albumin ELP: 3.5 g/dL (ref 2.9–4.4)
Alpha-1-Globulin: 0.2 g/dL (ref 0.0–0.4)
Alpha-2-Globulin: 0.8 g/dL (ref 0.4–1.0)
Beta Globulin: 1 g/dL (ref 0.7–1.3)
Gamma Globulin: 0.8 g/dL (ref 0.4–1.8)
Globulin, Total: 2.8 g/dL (ref 2.2–3.9)
Total Protein ELP: 6.3 g/dL (ref 6.0–8.5)

## 2018-07-19 LAB — KAPPA/LAMBDA LIGHT CHAINS
Kappa free light chain: 11.1 mg/L (ref 3.3–19.4)
Kappa, lambda light chain ratio: 1.44 (ref 0.26–1.65)
Lambda free light chains: 7.7 mg/L (ref 5.7–26.3)

## 2018-07-19 LAB — IRON AND TIBC
Iron: 116 ug/dL (ref 42–163)
Saturation Ratios: 32 % (ref 20–55)
TIBC: 363 ug/dL (ref 202–409)
UIBC: 247 ug/dL (ref 117–376)

## 2018-07-19 LAB — IGG, IGA, IGM
IgA: 55 mg/dL — ABNORMAL LOW (ref 61–437)
IgG (Immunoglobin G), Serum: 789 mg/dL (ref 603–1613)
IgM (Immunoglobulin M), Srm: 37 mg/dL (ref 15–143)

## 2018-07-19 LAB — FERRITIN: Ferritin: 64 ng/mL (ref 24–336)

## 2018-07-19 LAB — LACTATE DEHYDROGENASE: LDH: 182 U/L (ref 98–192)

## 2018-07-19 NOTE — Telephone Encounter (Signed)
July 15th appointments been moved to 1 pm.

## 2018-07-24 ENCOUNTER — Other Ambulatory Visit: Payer: Self-pay

## 2018-07-24 ENCOUNTER — Encounter: Payer: Self-pay | Admitting: Hematology & Oncology

## 2018-07-25 ENCOUNTER — Other Ambulatory Visit: Payer: Self-pay | Admitting: *Deleted

## 2018-07-25 MED ORDER — FUSION PLUS PO CAPS: 1.0000 | ORAL_CAPSULE | Freq: Every day | ORAL | 4 refills | Status: DC

## 2018-07-25 NOTE — Telephone Encounter (Signed)
Rx for Fusion Plus resent to [place] per pt request

## 2018-08-23 ENCOUNTER — Other Ambulatory Visit: Payer: Self-pay | Admitting: Emergency Medicine

## 2018-08-29 ENCOUNTER — Ambulatory Visit: Payer: Medicare Other

## 2018-08-29 ENCOUNTER — Inpatient Hospital Stay: Payer: Medicare Other | Attending: Hematology & Oncology | Admitting: Family

## 2018-08-29 ENCOUNTER — Inpatient Hospital Stay: Payer: Medicare Other

## 2018-08-29 ENCOUNTER — Other Ambulatory Visit (HOSPITAL_COMMUNITY): Payer: Medicare Other

## 2018-08-29 ENCOUNTER — Other Ambulatory Visit: Payer: Self-pay

## 2018-08-29 ENCOUNTER — Encounter: Payer: Self-pay | Admitting: Family

## 2018-08-29 VITALS — BP 130/69 | HR 57 | Temp 97.5°F | Resp 18 | Ht 72.0 in | Wt 219.8 lb

## 2018-08-29 DIAGNOSIS — C9000 Multiple myeloma not having achieved remission: Secondary | ICD-10-CM

## 2018-08-29 DIAGNOSIS — Z7901 Long term (current) use of anticoagulants: Secondary | ICD-10-CM | POA: Diagnosis not present

## 2018-08-29 DIAGNOSIS — D500 Iron deficiency anemia secondary to blood loss (chronic): Secondary | ICD-10-CM

## 2018-08-29 DIAGNOSIS — Z79899 Other long term (current) drug therapy: Secondary | ICD-10-CM | POA: Diagnosis not present

## 2018-08-29 DIAGNOSIS — I749 Embolism and thrombosis of unspecified artery: Secondary | ICD-10-CM | POA: Diagnosis not present

## 2018-08-29 DIAGNOSIS — C9001 Multiple myeloma in remission: Secondary | ICD-10-CM

## 2018-08-29 DIAGNOSIS — M899 Disorder of bone, unspecified: Secondary | ICD-10-CM

## 2018-08-29 LAB — CMP (CANCER CENTER ONLY)
ALT: 42 U/L (ref 0–44)
AST: 32 U/L (ref 15–41)
Albumin: 4 g/dL (ref 3.5–5.0)
Alkaline Phosphatase: 100 U/L (ref 38–126)
Anion gap: 7 (ref 5–15)
BUN: 16 mg/dL (ref 8–23)
CO2: 27 mmol/L (ref 22–32)
Calcium: 8.5 mg/dL — ABNORMAL LOW (ref 8.9–10.3)
Chloride: 105 mmol/L (ref 98–111)
Creatinine: 1.04 mg/dL (ref 0.61–1.24)
GFR, Est AFR Am: >60 mL/min (ref >60)
GFR, Estimated: >60 mL/min (ref >60)
Glucose, Bld: 96 mg/dL (ref 70–99)
Potassium: 3.8 mmol/L (ref 3.5–5.1)
Sodium: 139 mmol/L (ref 135–145)
Total Bilirubin: 0.7 mg/dL (ref 0.3–1.2)
Total Protein: 6.1 g/dL — ABNORMAL LOW (ref 6.5–8.1)

## 2018-08-29 LAB — CBC WITH DIFFERENTIAL (CANCER CENTER ONLY)
Abs Immature Granulocytes: 0.01 10*3/uL (ref 0.00–0.07)
Basophils Absolute: 0 10*3/uL (ref 0.0–0.1)
Basophils Relative: 1 %
Eosinophils Absolute: 0.1 10*3/uL (ref 0.0–0.5)
Eosinophils Relative: 1 %
HCT: 39.8 % (ref 39.0–52.0)
Hemoglobin: 13.6 g/dL (ref 13.0–17.0)
Immature Granulocytes: 0 %
Lymphocytes Relative: 34 %
Lymphs Abs: 2.3 10*3/uL (ref 0.7–4.0)
MCH: 30.6 pg (ref 26.0–34.0)
MCHC: 34.2 g/dL (ref 30.0–36.0)
MCV: 89.6 fL (ref 80.0–100.0)
Monocytes Absolute: 0.7 10*3/uL (ref 0.1–1.0)
Monocytes Relative: 10 %
Neutro Abs: 3.6 10*3/uL (ref 1.7–7.7)
Neutrophils Relative %: 54 %
Platelet Count: 168 10*3/uL (ref 150–400)
RBC: 4.44 MIL/uL (ref 4.22–5.81)
RDW: 13.1 % (ref 11.5–15.5)
WBC Count: 6.7 10*3/uL (ref 4.0–10.5)
nRBC: 0 % (ref 0.0–0.2)

## 2018-08-29 NOTE — Progress Notes (Signed)
Hematology and Oncology Follow Up Visit  [name] [name] [id number] 1940-03-05 78 y.o. 08/29/2018   Principle Diagnosis:  IgG kappa myeloma - Trisomy 11 by FISH Acute thromboembolism of the right lower leg  Past Therapy:  Patient s/p cycle 5 of Velcade/Revlimid/Decadron Revlimid 10mg  po q day (21/28) -- d/c on 09/15/2017 S/P ASCT at [facility] on 04/02/2015  Current Therapy:   Zometa 4 mg IV every3 months  - next dose in - 10/2018 Xarelto 10 mg by mouth daily   Interim History:  Mr. Cone is here today for follow-up. He is doing well but notes not having much energy.  He has been trying to walk some for exercise. He also has a garden and enjoys spending time in the yard.  He has joint and back aches and pains due to arthritis.  In June, there was no M-spike detected. IgG level was 789 and kappa light chains 1.11 mg/dL.  He has had no issues with bleeding. No bruising or petechiae.  No fever, chills, n/v, cough, rash, dizziness, SOB, chest pain, palpitations, abdominal pain or changes in bowel or bladder habits.  No swelling in his extremities. The neuropathy in his hands and lower extremities is stable.  No falls or syncopal episodes.  He is eating well and staying hydrated. His weight is stable.   ECOG Performance Status: 1 - Symptomatic but completely ambulatory  Medications:  Allergies as of 08/29/2018      Reactions   Budesonide-formoterol Fumarate Itching, Other (See Comments)   Codeine Itching   Hydrocodone-acetaminophen Itching   Mushroom Extract Complex Nausea And Vomiting, Other (See Comments)   Only shitake mushroom  causes this reaction. Flu-like symptoms from portabello mushrooms      Medication List       Accurate as of August 29, 2018  1:21 PM. If you have any questions, ask your nurse or doctor.        [name] 1 each by Does not apply route 5 (five) times daily. Use frequently, at least 5x a day.   amLODipine 5 MG tablet Commonly known as: NORVASC  Take 1 tablet (5 mg total) by mouth daily.   aspirin EC 81 MG tablet Take by mouth.   carvedilol 6.25 MG tablet Commonly known as: COREG Take 1 tablet (6.25 mg total) by mouth 2 (two) times daily with a meal.   cetirizine 10 MG tablet Commonly known as: ZyrTEC Allergy Take 1 tablet (10 mg total) by mouth daily.   esomeprazole 40 MG capsule Commonly known as: NEXIUM Take 1 capsule (40 mg total) by mouth daily at 12 noon.   famciclovir 500 MG tablet Commonly known as: FAMVIR Take 1 tablet (500 mg total) by mouth daily.   Fusion Plus Caps Take 1 tablet by mouth daily.   gabapentin 600 MG tablet Commonly known as: NEURONTIN TAKE 1 TABLET EVERY MORNING  AND TAKE 2 TABLETS EVERY NIGHT   hydrochlorothiazide 12.5 MG capsule Commonly known as: MICROZIDE Take 1 capsule (12.5 mg total) by mouth daily.   isosorbide mononitrate 30 MG 24 hr tablet Commonly known as: IMDUR Take 1 tablet (30 mg total) by mouth 2 (two) times daily.   Klor-Con M20 20 MEQ tablet Generic drug: potassium chloride SA TAKE 1 TABLET TWICE A DAY   levothyroxine 88 MCG tablet Commonly known as: SYNTHROID Take 1 tablet (88 mcg total) by mouth daily before breakfast.   LORazepam 1 MG tablet Commonly known as: ATIVAN Take 1 tablet (1 mg total) by mouth at  bedtime.   nitroGLYCERIN 0.4 MG SL tablet Commonly known as: NITROSTAT Place 0.4 mg under the tongue every 5 (five) minutes as needed. For chest pain   oxyCODONE-acetaminophen 10-325 MG tablet Commonly known as: PERCOCET Take 1 tablet by mouth every 8 (eight) hours as needed.   pyridoxine 100 MG tablet Commonly known as: B-6 Take 100 mg by mouth daily.   RA Vitamin B-12 TR 1000 MCG Tbcr Generic drug: Cyanocobalamin Take 1 tablet by mouth daily.   rivaroxaban 10 MG Tabs tablet Commonly known as: XARELTO Take 1 tablet (10 mg total) by mouth daily with supper.   traMADol 50 MG tablet Commonly known as: ULTRAM Take 50 mg by mouth as needed.        Allergies:  Allergies  Allergen Reactions  . Budesonide-Formoterol Fumarate Itching and Other (See Comments)  . Codeine Itching  . Hydrocodone-Acetaminophen Itching  . Mushroom Extract Complex Nausea And Vomiting and Other (See Comments)    Only shitake mushroom  causes this reaction. Flu-like symptoms from portabello mushrooms    Past Medical History, Surgical history, Social history, and Family History were reviewed and updated.  Review of Systems: All other 10 point review of systems is negative.   Physical Exam:  vitals were not taken for this visit.   Wt Readings from Last 3 Encounters:  07/18/18 217 lb (98.4 kg)  04/23/18 208 lb (94.3 kg)  03/08/18 217 lb 12.8 oz (98.8 kg)    Ocular: Sclerae unicteric, pupils equal, round and reactive to light Ear-nose-throat: Oropharynx clear, dentition fair Lymphatic: No cervical or supraclavicular adenopathy Lungs no rales or rhonchi, good excursion bilaterally Heart regular rate and rhythm, no murmur appreciated Abd soft, nontender, positive bowel sounds MSK no focal spinal tenderness, no joint edema, no liver or spleen tip palpated on exam, no fluid wave  Neuro: non-focal, well-oriented, appropriate affect Breasts: Deferred   Lab Results  Component Value Date   WBC 6.2 07/18/2018   HGB 13.7 07/18/2018   HCT 41.1 07/18/2018   MCV 90.7 07/18/2018   PLT 191 07/18/2018   Lab Results  Component Value Date   FERRITIN 64 07/18/2018   IRON 116 07/18/2018   TIBC 363 07/18/2018   UIBC 247 07/18/2018   IRONPCTSAT 32 07/18/2018   Lab Results  Component Value Date   RETICCTPCT 1.3 02/02/2018   RBC 4.53 07/18/2018   Lab Results  Component Value Date   KPAFRELGTCHN 11.1 07/18/2018   LAMBDASER 7.7 07/18/2018   KAPLAMBRATIO 1.44 07/18/2018   Lab Results  Component Value Date   IGGSERUM 789 07/18/2018   IGA 55 (L) 07/18/2018   IGMSERUM 37 07/18/2018   Lab Results  Component Value Date   TOTALPROTELP 6.3  07/18/2018   ALBUMINELP 3.5 07/18/2018   A1GS 0.2 07/18/2018   A2GS 0.8 07/18/2018   BETS 1.0 07/18/2018   BETA2SER 0.3 02/11/2015   GAMS 0.8 07/18/2018   MSPIKE Not Observed 07/18/2018   SPEI Comment 03/14/2017     Chemistry      Component Value Date/Time   NA 140 07/18/2018 1335   NA 140 01/27/2017 1044   NA 141 06/05/2015 1036   K 3.6 07/18/2018 1335   K 3.3 01/27/2017 1044   K 2.9 (LL) 06/05/2015 1036   CL 104 07/18/2018 1335   CL 101 01/27/2017 1044   CO2 28 07/18/2018 1335   CO2 28 01/27/2017 1044   CO2 27 06/05/2015 1036   BUN 14 07/18/2018 1335   BUN 14 01/27/2017 1044  BUN 13.1 06/05/2015 1036   CREATININE 1.20 07/18/2018 1335   CREATININE 1.59 (H) 10/06/2017 1215   CREATININE 1.0 06/05/2015 1036   GLU 152 03/30/2016      Component Value Date/Time   CALCIUM 9.0 07/18/2018 1335   CALCIUM 8.9 01/27/2017 1044   CALCIUM 9.4 06/05/2015 1036   ALKPHOS 117 07/18/2018 1335   ALKPHOS 126 (H) 01/27/2017 1044   ALKPHOS 94 06/05/2015 1036   AST 52 (H) 07/18/2018 1335   AST 22 06/05/2015 1036   ALT 63 (H) 07/18/2018 1335   ALT 33 01/27/2017 1044   ALT 18 06/05/2015 1036   BILITOT 0.8 07/18/2018 1335   BILITOT 0.59 06/05/2015 1036       Impression and Plan: Mr. Akel is a very pleasant 78 yo caucasian gentleman with history of IgG kappa myeloma and ASCT at [facility] in February 2017.  He is doing well and has no complaints at this time. He states that he follows up with [facility] again in September.  We will see him back in another 7 weeks and he will get Zometa at that time.  He will contact our office with any questions or concerns. We can certainly see her sooner if needed.   Laverna Peace, NP 7/15/20201:21 PM

## 2018-08-30 LAB — PROTEIN ELECTROPHORESIS, SERUM, WITH REFLEX
A/G Ratio: 1.7 (ref 0.7–1.7)
Albumin ELP: 3.7 g/dL (ref 2.9–4.4)
Alpha-1-Globulin: 0.1 g/dL (ref 0.0–0.4)
Alpha-2-Globulin: 0.7 g/dL (ref 0.4–1.0)
Beta Globulin: 0.7 g/dL (ref 0.7–1.3)
Gamma Globulin: 0.6 g/dL (ref 0.4–1.8)
Globulin, Total: 2.2 g/dL (ref 2.2–3.9)
Total Protein ELP: 5.9 g/dL — ABNORMAL LOW (ref 6.0–8.5)

## 2018-08-30 LAB — KAPPA/LAMBDA LIGHT CHAINS
Kappa free light chain: 11.7 mg/L (ref 3.3–19.4)
Kappa, lambda light chain ratio: 1.44 (ref 0.26–1.65)
Lambda free light chains: 8.1 mg/L (ref 5.7–26.3)

## 2018-08-30 LAB — IGG, IGA, IGM
IgA: 56 mg/dL — ABNORMAL LOW (ref 61–437)
IgG (Immunoglobin G), Serum: 820 mg/dL (ref 603–1613)
IgM (Immunoglobulin M), Srm: 42 mg/dL (ref 15–143)

## 2018-08-30 LAB — FERRITIN: Ferritin: 59 ng/mL (ref 24–336)

## 2018-08-30 LAB — IRON AND TIBC
Iron: 117 ug/dL (ref 42–163)
Saturation Ratios: 35 % (ref 20–55)
TIBC: 336 ug/dL (ref 202–409)
UIBC: 220 ug/dL (ref 117–376)

## 2018-09-03 ENCOUNTER — Other Ambulatory Visit (HOSPITAL_COMMUNITY)
Admission: RE | Admit: 2018-09-03 | Discharge: 2018-09-03 | Disposition: A | Discharge status: Home / Self Care | Payer: Medicare Other | Source: Ambulatory Visit | Attending: Emergency Medicine | Admitting: Emergency Medicine

## 2018-09-03 DIAGNOSIS — Z1159 Encounter for screening for other viral diseases: Secondary | ICD-10-CM | POA: Diagnosis not present

## 2018-09-03 LAB — SARS CORONAVIRUS 2 (TAT 6-24 HRS): SARS Coronavirus 2: NEGATIVE

## 2018-09-04 ENCOUNTER — Telehealth: Payer: Self-pay | Admitting: *Deleted

## 2018-09-04 ENCOUNTER — Other Ambulatory Visit: Payer: Self-pay | Admitting: Emergency Medicine

## 2018-09-04 DIAGNOSIS — J449 Chronic obstructive pulmonary disease, unspecified: Secondary | ICD-10-CM

## 2018-09-04 NOTE — Telephone Encounter (Signed)
Left vmail reminding patient not to use any inhalers before PFT tomorrow. Nothing further needed.

## 2018-09-05 ENCOUNTER — Encounter: Payer: Self-pay | Admitting: Emergency Medicine

## 2018-09-05 ENCOUNTER — Ambulatory Visit (INDEPENDENT_AMBULATORY_CARE_PROVIDER_SITE_OTHER): Payer: Medicare Other | Admitting: Emergency Medicine

## 2018-09-05 ENCOUNTER — Other Ambulatory Visit: Payer: Self-pay

## 2018-09-05 DIAGNOSIS — J449 Chronic obstructive pulmonary disease, unspecified: Secondary | ICD-10-CM

## 2018-09-05 LAB — PULMONARY FUNCTION TEST
DL/VA % pred: 91 %
DL/VA: 3.58 ml/min/mmHg/L
DLCO cor % pred: 83 %
DLCO cor: 22.11 ml/min/mmHg
DLCO unc % pred: 81 %
DLCO unc: 21.46 ml/min/mmHg
FEF 25-75 Post: 1.67 L/sec
FEF 25-75 Pre: 1.22 L/sec
FEF2575-%Change-Post: 36 %
FEF2575-%Pred-Post: 72 %
FEF2575-%Pred-Pre: 52 %
FEV1-%Change-Post: 7 %
FEV1-%Pred-Post: 83 %
FEV1-%Pred-Pre: 77 %
FEV1-Post: 2.7 L
FEV1-Pre: 2.51 L
FEV1FVC-%Change-Post: 5 %
FEV1FVC-%Pred-Pre: 90 %
FEV6-%Change-Post: 4 %
FEV6-%Pred-Post: 92 %
FEV6-%Pred-Pre: 88 %
FEV6-Post: 3.92 L
FEV6-Pre: 3.75 L
FEV6FVC-%Change-Post: 2 %
FEV6FVC-%Pred-Post: 105 %
FEV6FVC-%Pred-Pre: 103 %
FVC-%Change-Post: 2 %
FVC-%Pred-Post: 87 %
FVC-%Pred-Pre: 85 %
FVC-Post: 3.94 L
FVC-Pre: 3.85 L
Post FEV1/FVC ratio: 69 %
Post FEV6/FVC ratio: 100 %
Pre FEV1/FVC ratio: 65 %
Pre FEV6/FVC Ratio: 97 %
RV % pred: 153 %
RV: 4.15 L
TLC % pred: 112 %
TLC: 8.41 L

## 2018-09-05 MED ORDER — FLUTICASONE PROPIONATE 50 MCG/ACT NA SUSP: 2.0000 | Freq: Every day | NASAL | 2 refills | Status: DC

## 2018-09-05 NOTE — Progress Notes (Signed)
Full PFT performed today. °

## 2018-09-05 NOTE — Progress Notes (Signed)
Subjective:    Patient ID: [name] [name], male    DOB: 1941/01/27, 78 y.o.   MRN: [id number]  HPI 78 year old gentleman, former smoker (pack years) with a history of multiple myeloma and bone marrow transplant at [facility] 03/2015, pulmonary embolism and DVT on lifelong anticoagulation, obstructive sleep apnea on CPAP.  He has been followed for COPD, no spirometry available in our system.  Also with pulmonary nodules followed by CT chest.  Most recent was 09/18/2017 which I have reviewed and which shows some right middle lobe and lingular, left lower lobe bronchiectatic change, stable LLL scar / inflammation, small left pleural effusion and a stable left lower lobe 6 mm nodule.   He was treated with Revlimid x 2 years, just stopped it. Is on famvir.   He has been producing more colored mucous for about 3 months, was treated with abx x 2 by Dr Katheran Awe and prompted the CT chest as above. The mucous thinned out some w abx, then he caught URI about 2 weeks ago and has had some hoarse voice, increased mucous with color.  He has chronic sinus drainage, GERD. Has breakthrough PND, no breakthrough GERD.   Currently on Singulair, has albuterol to use as needed. CPAP compliance is excellent, he has significant clinical benefit. More energy, less daytime SOB.   Needs repeat CT chest 1 year (at least) for nodule, maybe sooner for BTX.   ROV 12/11/17 --[name] follows up today for his history of DVT/PE, COPD, mild bronchiectasis noted on CT scan of the chest, chronic cough, OSA on CPAP.Marland Kitchen  He also has a history of pulmonary nodules, notably a stable 6 mm left lower lobe nodule that is likely benign. He reports today that his chest congestion is better. He is having some eye tearing, occasional sneeze. He was on singulair, started zyrtec last time and is using flonase prn. He tried stopping singulair and has done OK. He is on nexium qd without any breakthrough.  CPAP compliance and clinical benefit is excellent.   ROV 09/05/2018 --[name] is 62, has a history of multiple myeloma with bone marrow transplant in 2017, DVT/PE on lifelong anticoagulation, OSA on CPAP, COPD with mild bronchiectatic change on CT scan of the chest and some associated chronic cough.  We have also followed him for pulmonary nodules including a stable 6 mm left lower lobe nodule is likely benign, no follow-up planned.  He experienced a flare of his bronchiectasis in April for which he was treated with prednisone and doxycycline. In retrospect he is having continued sx, coughing up some thick green mucous when he lays down at night, also in the am when he gets up, especially after his shower. He uses mucinex prn - has sometimes gotten relief from this, less cough, more clear. He started a flutter valve about 1 month ago.   On zyrtec, Otherwise he is been managed on albuterol as needed, never needs it.  He underwent pulmonary function testing today which I reviewed.  This shows moderate obstruction without a bronchodilator response, hyperinflated volumes and a normal diffusion capacity.  He has a CPAP compliance download over the last 30 days, he has 97% compliance for greater than 4 hours a night.  He is set on 10 cm water.   Review of Systems      Objective:   Physical Exam  Vitals:   09/05/18 1520  BP: 120/70  Pulse: 60  Temp: 97.7 F (36.5 C)  TempSrc: Oral  SpO2: 96%  Weight: 216 lb (98 kg)  Height: 6' (1.829 m)   Gen: Pleasant, well-nourished, in no distress,  normal affect  ENT: No lesions,  mouth clear,  oropharynx clear, no postnasal drip  Neck: No JVD, no stridor  Lungs: No use of accessory muscles, coarse at B bases, otherwise clear, no wheeze  Cardiovascular: RRR, heart sounds normal, no murmur or gallops, no peripheral edema  Musculoskeletal: No deformities, no cyanosis or clubbing  Neuro: alert, non focal  Skin: Warm, no lesions or rash     Assessment & Plan:  Asthma with COPD Moderate  obstruction confirmed on his PFT from today.  He denies any dyspnea.  He has albuterol but never uses it.  I actually asked him to consider using to help with secretion management as his bronchiectasis is the most bothersome pulmonary process  Bronchiectasis without complication ([place]) He is dealing with secretions at least twice a day, when he gets up in the morning and also when he lies down in the evening for a nap.  I would like to enhance his secretion clearance techniques and also try to decrease the amount of sinus mucus that gets to his chest.  We will continue flutter valve, add Mucinex.  Also enhance his allergy regimen by adding fluticasone nasal spray to his Zyrtec.  I talked him today about the signs and symptoms of an acute exacerbation  Obstructive sleep apnea Doing well on CPAP.  Great compliance.  Great clinical benefit.  Continue same.  Baltazar Apo, MD, PhD 09/05/2018, 3:53 PM [facility] [phone] or if no answer [phone]

## 2018-09-05 NOTE — Assessment & Plan Note (Signed)
Doing well on CPAP.  Great compliance.  Great clinical benefit.  Continue same.

## 2018-09-05 NOTE — Assessment & Plan Note (Signed)
He is dealing with secretions at least twice a day, when he gets up in the morning and also when he lies down in the evening for a nap.  I would like to enhance his secretion clearance techniques and also try to decrease the amount of sinus mucus that gets to his chest.  We will continue flutter valve, add Mucinex.  Also enhance his allergy regimen by adding fluticasone nasal spray to his Zyrtec.  I talked him today about the signs and symptoms of an acute exacerbation

## 2018-09-05 NOTE — Patient Instructions (Signed)
Congratulations on your great compliance with your CPAP.  Continue to use it every night as you have been doing. Continue your Zyrtec once daily. Start fluticasone nasal spray, 2 sprays each nostril once daily. Restart Mucinex 600 mg once daily to see if this helps with her cough, mucus burden Use your flutter valve approximately twice a day to help with secretion clearance. Keep albuterol available to use 2 puffs if needed for shortness of breath.  You can also use this to help clear your mucus. Follow with Dr Lamonte Sakai in 6 months or sooner if you have any problems

## 2018-09-05 NOTE — Assessment & Plan Note (Signed)
Moderate obstruction confirmed on his PFT from today.  He denies any dyspnea.  He has albuterol but never uses it.  I actually asked him to consider using to help with secretion management as his bronchiectasis is the most bothersome pulmonary process

## 2018-09-17 DIAGNOSIS — H1131 Conjunctival hemorrhage, right eye: Secondary | ICD-10-CM | POA: Diagnosis not present

## 2018-10-05 DIAGNOSIS — H1131 Conjunctival hemorrhage, right eye: Secondary | ICD-10-CM | POA: Diagnosis not present

## 2018-10-05 DIAGNOSIS — H04123 Dry eye syndrome of bilateral lacrimal glands: Secondary | ICD-10-CM | POA: Diagnosis not present

## 2018-10-05 DIAGNOSIS — H26491 Other secondary cataract, right eye: Secondary | ICD-10-CM | POA: Diagnosis not present

## 2018-10-05 DIAGNOSIS — H353131 Nonexudative age-related macular degeneration, bilateral, early dry stage: Secondary | ICD-10-CM | POA: Diagnosis not present

## 2018-10-08 ENCOUNTER — Other Ambulatory Visit: Payer: Self-pay

## 2018-10-08 ENCOUNTER — Encounter: Payer: Self-pay | Admitting: Sports Medicine

## 2018-10-08 ENCOUNTER — Ambulatory Visit (INDEPENDENT_AMBULATORY_CARE_PROVIDER_SITE_OTHER): Payer: Medicare Other | Admitting: Sports Medicine

## 2018-10-08 VITALS — BP 129/72 | HR 56 | Ht 72.0 in | Wt 217.0 lb

## 2018-10-08 DIAGNOSIS — M47815 Spondylosis without myelopathy or radiculopathy, thoracolumbar region: Secondary | ICD-10-CM | POA: Diagnosis not present

## 2018-10-08 DIAGNOSIS — E119 Type 2 diabetes mellitus without complications: Secondary | ICD-10-CM

## 2018-10-08 DIAGNOSIS — Z23 Encounter for immunization: Secondary | ICD-10-CM

## 2018-10-08 DIAGNOSIS — N138 Other obstructive and reflux uropathy: Secondary | ICD-10-CM

## 2018-10-08 DIAGNOSIS — N401 Enlarged prostate with lower urinary tract symptoms: Secondary | ICD-10-CM | POA: Diagnosis not present

## 2018-10-08 DIAGNOSIS — Z0000 Encounter for general adult medical examination without abnormal findings: Secondary | ICD-10-CM | POA: Diagnosis not present

## 2018-10-08 DIAGNOSIS — E039 Hypothyroidism, unspecified: Secondary | ICD-10-CM | POA: Diagnosis not present

## 2018-10-08 DIAGNOSIS — L600 Ingrowing nail: Secondary | ICD-10-CM | POA: Insufficient documentation

## 2018-10-08 MED ORDER — CIPROFLOXACIN HCL 750 MG PO TABS: 750.0000 mg | ORAL_TABLET | Freq: Two times a day (BID) | ORAL | 0 refills | Status: AC

## 2018-10-08 MED ORDER — METRONIDAZOLE 500 MG PO TABS: 500.0000 mg | ORAL_TABLET | Freq: Two times a day (BID) | ORAL | 0 refills | Status: AC

## 2018-10-08 MED ORDER — TAMSULOSIN HCL 0.4 MG PO CAPS: 0.4000 mg | ORAL_CAPSULE | Freq: Every day | ORAL | 3 refills | Status: DC

## 2018-10-08 MED ORDER — LEVOTHYROXINE SODIUM 88 MCG PO TABS: 88.0000 ug | ORAL_TABLET | Freq: Every day | ORAL | 3 refills | Status: DC

## 2018-10-08 MED ORDER — DOXYCYCLINE HYCLATE 100 MG PO TABS: 100.0000 mg | ORAL_TABLET | Freq: Two times a day (BID) | ORAL | 0 refills | Status: AC

## 2018-10-08 MED ORDER — CETIRIZINE HCL 10 MG PO TABS: 10.0000 mg | ORAL_TABLET | Freq: Every day | ORAL | 1 refills | Status: DC

## 2018-10-08 MED ORDER — OXYCODONE-ACETAMINOPHEN 10-325 MG PO TABS: 1.0000 | ORAL_TABLET | Freq: Three times a day (TID) | ORAL | 0 refills | Status: DC | PRN

## 2018-10-08 NOTE — Progress Notes (Signed)
Subjective:   [name] [name] is a 78 y.o. male who presents for Medicare Annual/Subsequent preventive examination.  Review of Systems:  Comprehensive review of systems is negative except as noted below    Objective:    Vitals: BP 129/72   Pulse (!) 56   Ht 6' (1.829 m)   Wt 217 lb (98.4 kg)   BMI 29.43 kg/m   Body mass index is 29.43 kg/m.  Advanced Directives 08/29/2018 07/18/2018 04/23/2018 03/08/2018 03/08/2018 01/05/2018 11/24/2017  Does Patient Have a Medical Advance Directive? Yes Yes Yes Yes Yes Yes Yes  Type of Paramedic of [place];Living will [place];Living will Living will;Healthcare Power of Attorney - Living will;Healthcare Power of [place];Living will [place];Living will  Does patient want to make changes to medical advance directive? No - Patient declined - - - No - Patient declined No - Patient declined -  Copy of [place] in Chart? Yes - validated most recent copy scanned in chart (See row information) Yes - validated most recent copy scanned in chart (See row information) - - No - copy requested No - copy requested -  Would patient like information on creating a medical advance directive? No - Patient declined - - - No - Patient declined No - Patient declined -  Pre-existing out of facility DNR order (yellow form or pink MOST form) - - - - - - -    Tobacco Social History   Tobacco Use  Smoking Status Former Smoker  . Years: 30.00  . Types: Pipe  . Quit date: 11/24/2011  . Years since quitting: 6.8  Smokeless Tobacco Former Systems developer  . Quit date: 02/15/2011  Tobacco Comment   quit tobacco 3 years ago     Counseling given: Not Answered Comment: quit tobacco 3 years ago   Clinical Intake:  BPH: History of a TURP, did well until recently, starting to have increasing urgency, hesitancy, nocturia.  Weak stream.  Foot pain: Right lateral great  toenail paronychia.  Moderate pain.    Past Medical History:  Diagnosis Date  . Acute deep vein thrombosis (DVT) of tibial vein of right lower extremity ([place]) 09/29/2014  . Arthritis    back  . Asthma    uses Advair bid and asmanex prn  . Cancer ([place])    multiple myeloma  . Chronic back pain    buldging disc and scoliosis  . Chronic bronchitis ([place])    couple of yrs ago  . Complication of anesthesia    severe case of hiccups after hernia surgery;required medication  . Diverticulosis   . Dizziness    related to neck issues  . Emphysema   . Enlarged prostate    takes Flomax daily  . GERD (gastroesophageal reflux disease)    takes PRotonix daily  . History of colon polyps   . Hyperlipidemia    takes Zocor daily  . Hypertension    takes HCTZ,Diovan daily and Isosorbide daily  . Itching    down left arm and leg  . Joint pain   . Macular degeneration    mild,beginning  . Multiple myeloma ([place]) 08/13/2014  . Myocardial infarction ([place]) 2010  . Neck pain   . Pain in limb 06/20/2012  . Pneumonia    hx of---12+yrs ago   Past Surgical History:  Procedure Laterality Date  . ANTERIOR CERVICAL DECOMP/DISCECTOMY FUSION  01/31/2012   Procedure: ANTERIOR CERVICAL DECOMPRESSION/DISCECTOMY FUSION 1  LEVEL;  Surgeon: Floyce Stakes, MD;  Location: [facility];  Service: Neurosurgery;  Laterality: N/A;  Cervical Five-Six  Anterior Cervical Decompression /Diskectomy /Fusion/Plate  . BONE MARROW TRANSPLANT    . CARDIAC CATHETERIZATION  2010/2013   06/07/11 [facility]) LAD 25%, D2 75% (small), pLCx 100%, RCA 25%--medical managment   . COLONOSCOPY    . HERNIA REPAIR  2006   x 3   . salivary gland removed     left   . skin cancer removed from left side of face     basal cell carcinoma  . vein removed from left leg     Family History  Problem Relation Age of Onset  . Hypertension Mother   . Heart attack Father   . Cancer Sister   . Cancer Sister    Social History   Socioeconomic  History  . Marital status: Married    Spouse name: Not on file  . Number of children: 3  . Years of education: Not on file  . Highest education level: Not on file  Occupational History  . Not on file  Social Needs  . Financial resource strain: Not on file  . Food insecurity    Worry: Not on file    Inability: Not on file  . Transportation needs    Medical: Not on file    Non-medical: Not on file  Tobacco Use  . Smoking status: Former Smoker    Years: 30.00    Types: Pipe    Quit date: 11/24/2011    Years since quitting: 6.8  . Smokeless tobacco: Former Systems developer    Quit date: 02/15/2011  . Tobacco comment: quit tobacco 3 years ago  Substance and Sexual Activity  . Alcohol use: Yes    Alcohol/week: 0.0 standard drinks    Comment: 1-2 glasses of wine daily  . Drug use: No  . Sexual activity: Yes  Lifestyle  . Physical activity    Days per week: Not on file    Minutes per session: Not on file  . Stress: Not on file  Relationships  . Social Herbalist on phone: Not on file    Gets together: Not on file    Attends religious service: Not on file    Active member of club or organization: Not on file    Attends meetings of clubs or organizations: Not on file    Relationship status: Not on file  Other Topics Concern  . Not on file  Social History Narrative  . Not on file    Outpatient Encounter Medications as of 10/08/2018  Medication Sig  . amLODipine (NORVASC) 5 MG tablet Take 1 tablet (5 mg total) by mouth daily.  Marland Kitchen aspirin EC 81 MG tablet Take by mouth.  . carvedilol (COREG) 6.25 MG tablet Take 1 tablet (6.25 mg total) by mouth 2 (two) times daily with a meal.  . cetirizine (ZYRTEC ALLERGY) 10 MG tablet Take 1 tablet (10 mg total) by mouth daily.  . Cyanocobalamin (RA VITAMIN B-12 TR) 1000 MCG TBCR Take 1 tablet by mouth daily.   Marland Kitchen esomeprazole (NEXIUM) 40 MG capsule Take 1 capsule (40 mg total) by mouth daily at 12 noon.  . famciclovir (FAMVIR) 500 MG tablet  Take 1 tablet (500 mg total) by mouth daily.  . fluticasone (FLONASE) 50 MCG/ACT nasal spray Place 2 sprays into both nostrils daily.  Marland Kitchen gabapentin (NEURONTIN) 600 MG tablet TAKE 1 TABLET EVERY MORNING  AND TAKE 2  TABLETS EVERY NIGHT  . hydrochlorothiazide (MICROZIDE) 12.5 MG capsule Take 1 capsule (12.5 mg total) by mouth daily.  . Iron-FA-B Cmp-C-Biot-Probiotic (FUSION PLUS) CAPS Take 1 tablet by mouth daily.  . isosorbide mononitrate (IMDUR) 30 MG 24 hr tablet Take 1 tablet (30 mg total) by mouth 2 (two) times daily.  Marland Kitchen KLOR-CON M20 20 MEQ tablet TAKE 1 TABLET TWICE A DAY  . levothyroxine (SYNTHROID) 88 MCG tablet Take 1 tablet (88 mcg total) by mouth daily before breakfast.  . LORazepam (ATIVAN) 1 MG tablet Take 1 tablet (1 mg total) by mouth at bedtime.  . nitroGLYCERIN (NITROSTAT) 0.4 MG SL tablet Place 0.4 mg under the tongue every 5 (five) minutes as needed. For chest pain  . oxyCODONE-acetaminophen (PERCOCET) 10-325 MG tablet Take 1 tablet by mouth every 8 (eight) hours as needed.  . pyridoxine (B-6) 100 MG tablet Take 100 mg by mouth daily.   Marland Kitchen Respiratory Therapy Supplies (AEROBIKA) DEVI 1 each by Does not apply route 5 (five) times daily. Use frequently, at least 5x a day.  . rivaroxaban (XARELTO) 10 MG TABS tablet Take 1 tablet (10 mg total) by mouth daily with supper.  . traMADol (ULTRAM) 50 MG tablet Take 50 mg by mouth as needed.  . [DISCONTINUED] levothyroxine (SYNTHROID, LEVOTHROID) 88 MCG tablet Take 1 tablet (88 mcg total) by mouth daily before breakfast.  . [DISCONTINUED] oxyCODONE-acetaminophen (PERCOCET) 10-325 MG tablet Take 1 tablet by mouth every 8 (eight) hours as needed.  . ciprofloxacin (CIPRO) 750 MG tablet Take 1 tablet (750 mg total) by mouth 2 (two) times daily for 7 days.  Marland Kitchen doxycycline (VIBRA-TABS) 100 MG tablet Take 1 tablet (100 mg total) by mouth 2 (two) times daily for 7 days.  . metroNIDAZOLE (FLAGYL) 500 MG tablet Take 1 tablet (500 mg total) by mouth 2  (two) times daily for 7 days.  . tamsulosin (FLOMAX) 0.4 MG CAPS capsule Take 1 capsule (0.4 mg total) by mouth daily after breakfast.   No facility-administered encounter medications on file as of 10/08/2018.     Activities of Daily Living No flowsheet data found.  Patient Care Team: Silverio Decamp, MD as PCP - General (Sports Medicine) Stanford Breed Denice Bors, MD as PCP - Cardiology (Cardiology) Leeroy Cha, MD as Consulting Physician (Neurosurgery) Marlaine Hind, MD as Consulting Physician (Physical Medicine and Rehabilitation)   Assessment:   This is a routine wellness examination for [employer].  Exercise Activities and Dietary recommendations   30 minutes 5 times a week moderate intensity aerobic exercise  Goals   None     Fall Risk Fall Risk  12/29/2017 12/07/2016 01/21/2016 11/20/2015 10/22/2015  Falls in the past year? 0 No No No No  Comment Emmi Telephone Survey: data to providers prior to load - - - -  Number falls in past yr: - - - - -  Injury with Fall? - - - - -   Is the patient's home free of loose throw rugs in walkways, pet beds, electrical cords, etc?   yes      Grab bars in the bathroom? yes      Handrails on the stairs?   yes      Adequate lighting?   yes  Timed Get Up and Go Performed: Not needed.  Depression Screen PHQ 2/9 Scores 10/08/2018 12/26/2017 12/07/2016 03/09/2015  PHQ - 2 Score 1 0 0 0    Cognitive Function        Immunization History  Administered Date(s) Administered  .  DTaP 04/04/2016, 06/03/2016  . DTaP / HiB / IPV 04/12/2017  . Fluad Quad(high Dose 65+) 10/08/2018  . Hepatitis B, adult 04/04/2016, 06/03/2016, 04/12/2017  . HiB (PRP-T) 04/04/2016, 06/03/2016  . IPV 04/04/2016, 06/03/2016  . Influenza Inj Mdck Quad Pf 01/06/2012  . Influenza, High Dose Seasonal PF 12/07/2016, 11/14/2017  . Influenza, Seasonal, Injecte, Preservative Fre 12/27/2013, 12/01/2014  . Influenza,inj,Quad PF,6+ Mos 12/27/2013, 12/01/2014, 12/02/2015   . Influenza-Unspecified 01/06/2012, 12/01/2014  . Pneumococcal Conjugate-13 10/07/2015, 04/04/2016, 06/03/2016  . Pneumococcal Polysaccharide-23 12/27/2013, 04/12/2017    Qualifies for Shingles Vaccine? We can do this next time.  Screening Tests Health Maintenance  Topic Date Due  . HEMOGLOBIN A1C  06/18/2017  . FOOT EXAM  08/12/2017  . URINE MICROALBUMIN  08/12/2017  . INFLUENZA VACCINE  09/15/2018  . OPHTHALMOLOGY EXAM  10/01/2019  . TETANUS/TDAP  05/16/2026  . PNA vac Low Risk Adult  Completed   Cancer Screenings: Lung: Low Dose CT Chest recommended if Age 66-80 years, 30 pack-year currently smoking OR have quit w/in 15years. Patient does not qualify. Colorectal: Done.  Additional Screenings: None Hepatitis C Screening:      Plan:  Benign prostatic hyperplasia with urinary obstruction Persistent symptoms. Adding urinalysis, urine culture. Unable to use Cialis due to current usage of isosorbide mononitrate. Using Flomax instead. Adding a PSA.  Diabetes mellitus, type 2 Due for labs. It has been diet controlled for some time now. Also needs a urine microalbumin. Foot exam done today. Ophthalmology exam was last week.  Ingrown toenail of right foot We are going to start with triple antibiotic treatment for a below the diaphragm infection in a diabetic. He has a mild paronychia at the great lateral nail fold. If no better after a month I will do a great lateral nail plate removal with phenol matricectomy.   I have personally reviewed and noted the following in the patient's chart:   . Medical and social history . Use of alcohol, tobacco or illicit drugs  . Current medications and supplements . Functional ability and status . Nutritional status . Physical activity . Advanced directives . List of other physicians . Hospitalizations, surgeries, and ER visits in previous 12 months . Vitals . Screenings to include cognitive, depression, and falls . Referrals and  appointments  In addition, I have reviewed and discussed with patient certain preventive protocols, quality metrics, and best practice recommendations. A written personalized care plan for preventive services as well as general preventive health recommendations were provided to patient.     Aundria Mems, MD  10/08/2018

## 2018-10-08 NOTE — Assessment & Plan Note (Addendum)
Persistent symptoms. Adding urinalysis, urine culture. Unable to use Cialis due to current usage of isosorbide mononitrate. Using Flomax instead. Adding a PSA.

## 2018-10-08 NOTE — Assessment & Plan Note (Signed)
We are going to start with triple antibiotic treatment for a below the diaphragm infection in a diabetic. He has a mild paronychia at the great lateral nail fold. If no better after a month I will do a great lateral nail plate removal with phenol matricectomy.

## 2018-10-08 NOTE — Assessment & Plan Note (Signed)
Due for labs. It has been diet controlled for some time now. Also needs a urine microalbumin. Foot exam done today. Ophthalmology exam was last week.

## 2018-10-09 ENCOUNTER — Other Ambulatory Visit: Payer: Self-pay | Admitting: Emergency Medicine

## 2018-10-09 ENCOUNTER — Other Ambulatory Visit: Payer: Self-pay | Admitting: Sports Medicine

## 2018-10-09 DIAGNOSIS — N401 Enlarged prostate with lower urinary tract symptoms: Secondary | ICD-10-CM

## 2018-10-09 DIAGNOSIS — N138 Other obstructive and reflux uropathy: Secondary | ICD-10-CM

## 2018-10-09 LAB — URINALYSIS W MICROSCOPIC + REFLEX CULTURE
Bacteria, UA: NONE SEEN /HPF
Bilirubin Urine: NEGATIVE
Glucose, UA: NEGATIVE
Hgb urine dipstick: NEGATIVE
Hyaline Cast: NONE SEEN /LPF
Ketones, ur: NEGATIVE
Leukocyte Esterase: NEGATIVE
Nitrites, Initial: NEGATIVE
Protein, ur: NEGATIVE
Specific Gravity, Urine: 1.016 (ref 1.001–1.03)
Squamous Epithelial / HPF: NONE SEEN /HPF (ref <=5)
WBC, UA: NONE SEEN /HPF (ref 0–5)
pH: 5.5 (ref 5.0–8.0)

## 2018-10-09 LAB — CBC
HCT: 41.7 % (ref 38.5–50.0)
Hemoglobin: 14.4 g/dL (ref 13.2–17.1)
MCH: 30.1 pg (ref 27.0–33.0)
MCHC: 34.5 g/dL (ref 32.0–36.0)
MCV: 87.2 fL (ref 80.0–100.0)
MPV: 9.6 fL (ref 7.5–12.5)
Platelets: 184 10*3/uL (ref 140–400)
RBC: 4.78 10*6/uL (ref 4.20–5.80)
RDW: 13 % (ref 11.0–15.0)
WBC: 6 10*3/uL (ref 3.8–10.8)

## 2018-10-09 LAB — MICROALBUMIN / CREATININE URINE RATIO
Creatinine, Urine: 89 mg/dL (ref 20–320)
Microalb Creat Ratio: 6 mcg/mg creat (ref <30)
Microalb, Ur: 0.5 mg/dL

## 2018-10-09 LAB — COMPLETE METABOLIC PANEL WITH GFR
AG Ratio: 1.7 (calc) (ref 1.0–2.5)
ALT: 47 U/L — ABNORMAL HIGH (ref 9–46)
AST: 33 U/L (ref 10–35)
Albumin: 4.1 g/dL (ref 3.6–5.1)
Alkaline phosphatase (APISO): 95 U/L (ref 35–144)
BUN: 16 mg/dL (ref 7–25)
CO2: 28 mmol/L (ref 20–32)
Calcium: 9.4 mg/dL (ref 8.6–10.3)
Chloride: 104 mmol/L (ref 98–110)
Creat: 1.16 mg/dL (ref 0.70–1.18)
GFR, Est African American: 70 mL/min/{1.73_m2} (ref >=60)
GFR, Est Non African American: 60 mL/min/{1.73_m2} (ref >=60)
Globulin: 2.4 g/dL (calc) (ref 1.9–3.7)
Glucose, Bld: 89 mg/dL (ref 65–99)
Potassium: 3.7 mmol/L (ref 3.5–5.3)
Sodium: 141 mmol/L (ref 135–146)
Total Bilirubin: 0.7 mg/dL (ref 0.2–1.2)
Total Protein: 6.5 g/dL (ref 6.1–8.1)

## 2018-10-09 LAB — PSA, TOTAL AND FREE
PSA, % Free: 17 % (calc) — ABNORMAL LOW (ref >25)
PSA, Free: 0.2 ng/mL
PSA, Total: 1.2 ng/mL (ref <=4.0)

## 2018-10-09 LAB — TSH: TSH: 4.38 mIU/L (ref 0.40–4.50)

## 2018-10-09 LAB — NO CULTURE INDICATED

## 2018-10-17 ENCOUNTER — Encounter: Payer: Self-pay | Admitting: Family

## 2018-10-17 ENCOUNTER — Inpatient Hospital Stay: Payer: Medicare Other | Attending: Hematology & Oncology

## 2018-10-17 ENCOUNTER — Inpatient Hospital Stay: Payer: Medicare Other

## 2018-10-17 ENCOUNTER — Other Ambulatory Visit: Payer: Self-pay

## 2018-10-17 ENCOUNTER — Inpatient Hospital Stay (HOSPITAL_BASED_OUTPATIENT_CLINIC_OR_DEPARTMENT_OTHER): Payer: Medicare Other | Admitting: Family

## 2018-10-17 VITALS — BP 139/59 | HR 58 | Temp 97.6°F | Resp 18 | Ht 72.0 in | Wt 222.0 lb

## 2018-10-17 DIAGNOSIS — C9001 Multiple myeloma in remission: Secondary | ICD-10-CM | POA: Diagnosis not present

## 2018-10-17 DIAGNOSIS — M899 Disorder of bone, unspecified: Secondary | ICD-10-CM

## 2018-10-17 DIAGNOSIS — M898X9 Other specified disorders of bone, unspecified site: Secondary | ICD-10-CM

## 2018-10-17 DIAGNOSIS — C9000 Multiple myeloma not having achieved remission: Secondary | ICD-10-CM | POA: Insufficient documentation

## 2018-10-17 DIAGNOSIS — D500 Iron deficiency anemia secondary to blood loss (chronic): Secondary | ICD-10-CM

## 2018-10-17 DIAGNOSIS — Z7901 Long term (current) use of anticoagulants: Secondary | ICD-10-CM | POA: Diagnosis not present

## 2018-10-17 DIAGNOSIS — I82401 Acute embolism and thrombosis of unspecified deep veins of right lower extremity: Secondary | ICD-10-CM | POA: Insufficient documentation

## 2018-10-17 LAB — IRON AND TIBC
Iron: 118 ug/dL (ref 42–163)
Saturation Ratios: 41 % (ref 20–55)
TIBC: 290 ug/dL (ref 202–409)
UIBC: 172 ug/dL (ref 117–376)

## 2018-10-17 LAB — CBC WITH DIFFERENTIAL (CANCER CENTER ONLY)
Abs Immature Granulocytes: 0.01 10*3/uL (ref 0.00–0.07)
Basophils Absolute: 0 10*3/uL (ref 0.0–0.1)
Basophils Relative: 1 %
Eosinophils Absolute: 0.1 10*3/uL (ref 0.0–0.5)
Eosinophils Relative: 2 %
HCT: 42.1 % (ref 39.0–52.0)
Hemoglobin: 14.1 g/dL (ref 13.0–17.0)
Immature Granulocytes: 0 %
Lymphocytes Relative: 31 %
Lymphs Abs: 1.7 10*3/uL (ref 0.7–4.0)
MCH: 30.2 pg (ref 26.0–34.0)
MCHC: 33.5 g/dL (ref 30.0–36.0)
MCV: 90.1 fL (ref 80.0–100.0)
Monocytes Absolute: 0.6 10*3/uL (ref 0.1–1.0)
Monocytes Relative: 12 %
Neutro Abs: 3 10*3/uL (ref 1.7–7.7)
Neutrophils Relative %: 54 %
Platelet Count: 174 10*3/uL (ref 150–400)
RBC: 4.67 MIL/uL (ref 4.22–5.81)
RDW: 13.6 % (ref 11.5–15.5)
WBC Count: 5.4 10*3/uL (ref 4.0–10.5)
nRBC: 0 % (ref 0.0–0.2)

## 2018-10-17 LAB — CMP (CANCER CENTER ONLY)
ALT: 63 U/L — ABNORMAL HIGH (ref 0–44)
AST: 42 U/L — ABNORMAL HIGH (ref 15–41)
Albumin: 3.9 g/dL (ref 3.5–5.0)
Alkaline Phosphatase: 68 U/L (ref 38–126)
Anion gap: 10 (ref 5–15)
BUN: 15 mg/dL (ref 8–23)
CO2: 25 mmol/L (ref 22–32)
Calcium: 9.5 mg/dL (ref 8.9–10.3)
Chloride: 105 mmol/L (ref 98–111)
Creatinine: 1.18 mg/dL (ref 0.61–1.24)
GFR, Est AFR Am: >60 mL/min (ref >60)
GFR, Estimated: 59 mL/min — ABNORMAL LOW (ref >60)
Glucose, Bld: 136 mg/dL — ABNORMAL HIGH (ref 70–99)
Potassium: 3.6 mmol/L (ref 3.5–5.1)
Sodium: 140 mmol/L (ref 135–145)
Total Bilirubin: 0.5 mg/dL (ref 0.3–1.2)
Total Protein: 6.2 g/dL — ABNORMAL LOW (ref 6.5–8.1)

## 2018-10-17 LAB — FERRITIN: Ferritin: 73 ng/mL (ref 24–336)

## 2018-10-17 MED ORDER — ZOLEDRONIC ACID 4 MG/100ML IV SOLN
4.0000 mg | Freq: Once | INTRAVENOUS | Status: AC
  Administered 2018-10-17: 4 mg via INTRAVENOUS
  Filled 2018-10-17: qty 100

## 2018-10-17 MED ORDER — SODIUM CHLORIDE 0.9 % IV SOLN
Freq: Once | INTRAVENOUS | Status: AC
  Administered 2018-10-17: 11:00:00 via INTRAVENOUS
  Filled 2018-10-17: qty 250

## 2018-10-17 NOTE — Progress Notes (Signed)
Hematology and Oncology Follow Up Visit  [name] [name] [id number] [birth year] 78 y.o. 10/17/2018   Principle Diagnosis:  IgG kappa myeloma - Trisomy 11 by FISH Acute thromboembolism of the right lower leg  Past Therapy:        S/P ASCT at [facility] on 04/02/2015 Patient s/p cycle 5 of Velcade/Revlimid/Decadron Revlimid 10mg  po q day (21/28) -- d/c on 09/15/2017  Current Therapy:   Zometa 4 mg IV every3 months  - next dose due 10/2018 Xarelto 10 mg by mouth daily   Interim History:  [name] [name] is here today for follow-up and Zometa. He is doing fairly well but did strain his back muscles moving things in his home. He is hoping to go visit the chiropractor soon.  In July, no M-spike was detected, IgG level was 820 mg/dL and kappa light chains were 1.17 mg/dL He has mild fatigue at times and will take a break to rest when needed.  He has had no fever, chills, n/v, cough, rash, SOB, chest pain, palpitations, abdominal pain or changes in bowel or bladder habits.  He has dizziness in the morning he states due to the medication he takes.  No bleeding, bruising or petechiae.  He has a right eye abrasion that he feels is more due to his CPAP mask. He is getting fitted for one that covers just the nose and mouth and hopes this will help. He has followed up with his ophthalmologist as well.  The neuropathy in his feet is unchanged. No falls or syncopal episodes to report.  He has puffiness in his ankles that comes and goes. He is hoping to start walking for exercise.  He has a good appetite and is staying well hydrated. His weight is stable.   ECOG Performance Status: 1 - Symptomatic but completely ambulatory  Medications:  Allergies as of 10/17/2018      Reactions   Budesonide-formoterol Fumarate Itching, Other (See Comments)   Codeine Itching   Hydrocodone-acetaminophen Itching   Mushroom Extract Complex Nausea And Vomiting, Other (See Comments)   Only shitake mushroom  causes this  reaction. Flu-like symptoms from portabello mushrooms      Medication List       Accurate as of October 17, 2018  9:54 AM. If you have any questions, ask your nurse or doctor.        [name] 1 each by Does not apply route 5 (five) times daily. Use frequently, at least 5x a day.   amLODipine 5 MG tablet Commonly known as: NORVASC Take 1 tablet (5 mg total) by mouth daily.   aspirin EC 81 MG tablet Take by mouth.   carvedilol 6.25 MG tablet Commonly known as: COREG Take 1 tablet (6.25 mg total) by mouth 2 (two) times daily with a meal.   EQ Allergy Relief (Cetirizine) 10 MG tablet Generic drug: cetirizine Take 1 tablet by mouth once daily   esomeprazole 40 MG capsule Commonly known as: NEXIUM Take 1 capsule (40 mg total) by mouth daily at 12 noon.   famciclovir 500 MG tablet Commonly known as: FAMVIR Take 1 tablet (500 mg total) by mouth daily.   fluticasone 50 MCG/ACT nasal spray Commonly known as: FLONASE Place 2 sprays into both nostrils daily.   Fusion Plus Caps Take 1 tablet by mouth daily.   gabapentin 600 MG tablet Commonly known as: NEURONTIN TAKE 1 TABLET EVERY MORNING  AND TAKE 2 TABLETS EVERY NIGHT   hydrochlorothiazide 12.5 MG capsule Commonly known as: MICROZIDE Take  1 capsule (12.5 mg total) by mouth daily.   isosorbide mononitrate 30 MG 24 hr tablet Commonly known as: IMDUR Take 1 tablet (30 mg total) by mouth 2 (two) times daily.   Klor-Con M20 20 MEQ tablet Generic drug: potassium chloride SA TAKE 1 TABLET TWICE A DAY   levothyroxine 88 MCG tablet Commonly known as: SYNTHROID Take 1 tablet (88 mcg total) by mouth daily before breakfast.   LORazepam 1 MG tablet Commonly known as: ATIVAN Take 1 tablet (1 mg total) by mouth at bedtime.   nitroGLYCERIN 0.4 MG SL tablet Commonly known as: NITROSTAT Place 0.4 mg under the tongue every 5 (five) minutes as needed. For chest pain   oxyCODONE-acetaminophen 10-325 MG tablet Commonly  known as: PERCOCET Take 1 tablet by mouth every 8 (eight) hours as needed.   pyridoxine 100 MG tablet Commonly known as: B-6 Take 100 mg by mouth daily.   RA Vitamin B-12 TR 1000 MCG Tbcr Generic drug: Cyanocobalamin Take 1 tablet by mouth daily.   rivaroxaban 10 MG Tabs tablet Commonly known as: XARELTO Take 1 tablet (10 mg total) by mouth daily with supper.   tamsulosin 0.4 MG Caps capsule Commonly known as: FLOMAX Take 1 capsule by mouth once daily   traMADol 50 MG tablet Commonly known as: ULTRAM Take 50 mg by mouth as needed.       Allergies:  Allergies  Allergen Reactions  . Budesonide-Formoterol Fumarate Itching and Other (See Comments)  . Codeine Itching  . Hydrocodone-Acetaminophen Itching  . Mushroom Extract Complex Nausea And Vomiting and Other (See Comments)    Only shitake mushroom  causes this reaction. Flu-like symptoms from portabello mushrooms    Past Medical History, Surgical history, Social history, and Family History were reviewed and updated.  Review of Systems: All other 10 point review of systems is negative.   Physical Exam:  vitals were not taken for this visit.   Wt Readings from Last 3 Encounters:  10/08/18 217 lb (98.4 kg)  09/05/18 216 lb (98 kg)  08/29/18 219 lb 12.8 oz (99.7 kg)    Ocular: Sclerae unicteric, pupils equal, round and reactive to light Ear-nose-throat: Oropharynx clear, dentition fair Lymphatic: No cervical or supraclavicular adenopathy Lungs no rales or rhonchi, good excursion bilaterally Heart regular rate and rhythm, no murmur appreciated Abd soft, nontender, positive bowel sounds, no liver or spleen tip palpated on exam, no fluid wave  MSK no focal spinal tenderness, no joint edema Neuro: non-focal, well-oriented, appropriate affect Breasts: Deferred   Lab Results  Component Value Date   WBC 5.4 10/17/2018   HGB 14.1 10/17/2018   HCT 42.1 10/17/2018   MCV 90.1 10/17/2018   PLT 174 10/17/2018   Lab  Results  Component Value Date   FERRITIN 59 08/29/2018   IRON 117 08/29/2018   TIBC 336 08/29/2018   UIBC 220 08/29/2018   IRONPCTSAT 35 08/29/2018   Lab Results  Component Value Date   RETICCTPCT 1.3 02/02/2018   RBC 4.67 10/17/2018   Lab Results  Component Value Date   KPAFRELGTCHN 11.7 08/29/2018   LAMBDASER 8.1 08/29/2018   KAPLAMBRATIO 1.44 08/29/2018   Lab Results  Component Value Date   IGGSERUM 820 08/29/2018   IGA 56 (L) 08/29/2018   IGMSERUM 42 08/29/2018   Lab Results  Component Value Date   TOTALPROTELP 5.9 (L) 08/29/2018   ALBUMINELP 3.7 08/29/2018   A1GS 0.1 08/29/2018   A2GS 0.7 08/29/2018   BETS 0.7 08/29/2018   BETA2SER 0.3  02/11/2015   GAMS 0.6 08/29/2018   MSPIKE Not Observed 08/29/2018   SPEI Comment 03/14/2017     Chemistry      Component Value Date/Time   NA 141 10/08/2018 1531   NA 140 01/27/2017 1044   NA 141 06/05/2015 1036   K 3.7 10/08/2018 1531   K 3.3 01/27/2017 1044   K 2.9 (LL) 06/05/2015 1036   CL 104 10/08/2018 1531   CL 101 01/27/2017 1044   CO2 28 10/08/2018 1531   CO2 28 01/27/2017 1044   CO2 27 06/05/2015 1036   BUN 16 10/08/2018 1531   BUN 14 01/27/2017 1044   BUN 13.1 06/05/2015 1036   CREATININE 1.16 10/08/2018 1531   CREATININE 1.0 06/05/2015 1036   GLU 152 03/30/2016      Component Value Date/Time   CALCIUM 9.4 10/08/2018 1531   CALCIUM 8.9 01/27/2017 1044   CALCIUM 9.4 06/05/2015 1036   ALKPHOS 100 08/29/2018 1308   ALKPHOS 126 (H) 01/27/2017 1044   ALKPHOS 94 06/05/2015 1036   AST 33 10/08/2018 1531   AST 32 08/29/2018 1308   AST 22 06/05/2015 1036   ALT 47 (H) 10/08/2018 1531   ALT 42 08/29/2018 1308   ALT 33 01/27/2017 1044   ALT 18 06/05/2015 1036   BILITOT 0.7 10/08/2018 1531   BILITOT 0.7 08/29/2018 1308   BILITOT 0.59 06/05/2015 1036       Impression and Plan: [name] [name] is a very pleasant 78 yo caucasian gentleman with history of IgG kappa myeloma and ASCT at [facility] in February 2017.  His  myeloma studies have remained stale and he is doing well in remission.  We will proceed with Zometa infusion today as planned.  We will see him back in another 3 months.  He will contact our office with any questions or concerns. We can certainly see him sooner if needed.   Laverna Peace, NP 9/2/20209:54 AM

## 2018-10-17 NOTE — Patient Instructions (Signed)
Zoledronic Acid injection (Hypercalcemia, Oncology) What is this medicine? ZOLEDRONIC ACID (ZOE le dron ik AS id) lowers the amount of calcium loss from bone. It is used to treat too much calcium in your blood from cancer. It is also used to prevent complications of cancer that has spread to the bone. This medicine may be used for other purposes; ask your health care provider or pharmacist if you have questions. COMMON BRAND NAME(S): Zometa What should I tell my health care provider before I take this medicine? They need to know if you have any of these conditions:  aspirin-sensitive asthma  cancer, especially if you are receiving medicines used to treat cancer  dental disease or wear dentures  infection  kidney disease  receiving corticosteroids like dexamethasone or prednisone  an unusual or allergic reaction to zoledronic acid, other medicines, foods, dyes, or preservatives  pregnant or trying to get pregnant  breast-feeding How should I use this medicine? This medicine is for infusion into a vein. It is given by a health care professional in a hospital or clinic setting. Talk to your pediatrician regarding the use of this medicine in children. Special care may be needed. Overdosage: If you think you have taken too much of this medicine contact a poison control center or emergency room at once. NOTE: This medicine is only for you. Do not share this medicine with others. What if I miss a dose? It is important not to miss your dose. Call your doctor or health care professional if you are unable to keep an appointment. What may interact with this medicine?  certain antibiotics given by injection  NSAIDs, medicines for pain and inflammation, like ibuprofen or naproxen  some diuretics like bumetanide, furosemide  teriparatide  thalidomide This list may not describe all possible interactions. Give your health care provider a list of all the medicines, herbs, non-prescription  drugs, or dietary supplements you use. Also tell them if you smoke, drink alcohol, or use illegal drugs. Some items may interact with your medicine. What should I watch for while using this medicine? Visit your doctor or health care professional for regular checkups. It may be some time before you see the benefit from this medicine. Do not stop taking your medicine unless your doctor tells you to. Your doctor may order blood tests or other tests to see how you are doing. Women should inform their doctor if they wish to become pregnant or think they might be pregnant. There is a potential for serious side effects to an unborn child. Talk to your health care professional or pharmacist for more information. You should make sure that you get enough calcium and vitamin D while you are taking this medicine. Discuss the foods you eat and the vitamins you take with your health care professional. Some people who take this medicine have severe bone, joint, and/or muscle pain. This medicine may also increase your risk for jaw problems or a broken thigh bone. Tell your doctor right away if you have severe pain in your jaw, bones, joints, or muscles. Tell your doctor if you have any pain that does not go away or that gets worse. Tell your dentist and dental surgeon that you are taking this medicine. You should not have major dental surgery while on this medicine. See your dentist to have a dental exam and fix any dental problems before starting this medicine. Take good care of your teeth while on this medicine. Make sure you see your dentist for regular follow-up   appointments. What side effects may I notice from receiving this medicine? Side effects that you should report to your doctor or health care professional as soon as possible:  allergic reactions like skin rash, itching or hives, swelling of the face, lips, or tongue  anxiety, confusion, or depression  breathing problems  changes in vision  eye  pain  feeling faint or lightheaded, falls  jaw pain, especially after dental work  mouth sores  muscle cramps, stiffness, or weakness  redness, blistering, peeling or loosening of the skin, including inside the mouth  trouble passing urine or change in the amount of urine Side effects that usually do not require medical attention (report to your doctor or health care professional if they continue or are bothersome):  bone, joint, or muscle pain  constipation  diarrhea  fever  hair loss  irritation at site where injected  loss of appetite  nausea, vomiting  stomach upset  trouble sleeping  trouble swallowing  weak or tired This list may not describe all possible side effects. Call your doctor for medical advice about side effects. You may report side effects to FDA at 1-800-FDA-1088. Where should I keep my medicine? This drug is given in a hospital or clinic and will not be stored at home. NOTE: This sheet is a summary. It may not cover all possible information. If you have questions about this medicine, talk to your doctor, pharmacist, or health care provider.  2020 Elsevier/Gold Standard (2013-06-29 14:19:39)  

## 2018-10-18 LAB — PROTEIN ELECTROPHORESIS, SERUM
A/G Ratio: 1.7 (ref 0.7–1.7)
Albumin ELP: 3.5 g/dL (ref 2.9–4.4)
Alpha-1-Globulin: 0.2 g/dL (ref 0.0–0.4)
Alpha-2-Globulin: 0.5 g/dL (ref 0.4–1.0)
Beta Globulin: 0.7 g/dL (ref 0.7–1.3)
Gamma Globulin: 0.7 g/dL (ref 0.4–1.8)
Globulin, Total: 2.1 g/dL — ABNORMAL LOW (ref 2.2–3.9)
Total Protein ELP: 5.6 g/dL — ABNORMAL LOW (ref 6.0–8.5)

## 2018-10-18 LAB — KAPPA/LAMBDA LIGHT CHAINS
Kappa free light chain: 14.8 mg/L (ref 3.3–19.4)
Kappa, lambda light chain ratio: 1.09 (ref 0.26–1.65)
Lambda free light chains: 13.6 mg/L (ref 5.7–26.3)

## 2018-10-18 LAB — IGG, IGA, IGM
IgA: 58 mg/dL — ABNORMAL LOW (ref 61–437)
IgG (Immunoglobin G), Serum: 848 mg/dL (ref 603–1613)
IgM (Immunoglobulin M), Srm: 45 mg/dL (ref 15–143)

## 2018-10-29 DIAGNOSIS — L218 Other seborrheic dermatitis: Secondary | ICD-10-CM | POA: Diagnosis not present

## 2018-10-29 DIAGNOSIS — D1801 Hemangioma of skin and subcutaneous tissue: Secondary | ICD-10-CM | POA: Diagnosis not present

## 2018-10-29 DIAGNOSIS — L570 Actinic keratosis: Secondary | ICD-10-CM | POA: Diagnosis not present

## 2018-10-29 DIAGNOSIS — D0339 Melanoma in situ of other parts of face: Secondary | ICD-10-CM | POA: Diagnosis not present

## 2018-10-29 DIAGNOSIS — D225 Melanocytic nevi of trunk: Secondary | ICD-10-CM | POA: Diagnosis not present

## 2018-10-29 DIAGNOSIS — D485 Neoplasm of uncertain behavior of skin: Secondary | ICD-10-CM | POA: Diagnosis not present

## 2018-10-29 DIAGNOSIS — L814 Other melanin hyperpigmentation: Secondary | ICD-10-CM | POA: Diagnosis not present

## 2018-10-29 DIAGNOSIS — L579 Skin changes due to chronic exposure to nonionizing radiation, unspecified: Secondary | ICD-10-CM | POA: Diagnosis not present

## 2018-10-29 DIAGNOSIS — L821 Other seborrheic keratosis: Secondary | ICD-10-CM | POA: Diagnosis not present

## 2018-11-04 ENCOUNTER — Other Ambulatory Visit: Payer: Self-pay | Admitting: Sports Medicine

## 2018-11-08 ENCOUNTER — Encounter: Payer: Self-pay | Admitting: Sports Medicine

## 2018-11-08 ENCOUNTER — Ambulatory Visit (INDEPENDENT_AMBULATORY_CARE_PROVIDER_SITE_OTHER): Payer: Medicare Other | Admitting: Sports Medicine

## 2018-11-08 DIAGNOSIS — L600 Ingrowing nail: Secondary | ICD-10-CM

## 2018-11-08 NOTE — Progress Notes (Signed)
Subjective:    CC: Follow-up  HPI: [name] [name] returns, he continues to have pain in his toe.  I reviewed the past medical history, family history, social history, surgical history, and allergies today and no changes were needed.  Please see the problem list section below in epic for further details.  Past Medical History: Past Medical History:  Diagnosis Date  . Acute deep vein thrombosis (DVT) of tibial vein of right lower extremity ([place]) 09/29/2014  . Arthritis    back  . Asthma    uses Advair bid and asmanex prn  . Cancer ([place])    multiple myeloma  . Chronic back pain    buldging disc and scoliosis  . Chronic bronchitis ([place])    couple of yrs ago  . Complication of anesthesia    severe case of hiccups after hernia surgery;required medication  . Diverticulosis   . Dizziness    related to neck issues  . Emphysema   . Enlarged prostate    takes Flomax daily  . GERD (gastroesophageal reflux disease)    takes PRotonix daily  . History of colon polyps   . Hyperlipidemia    takes Zocor daily  . Hypertension    takes HCTZ,Diovan daily and Isosorbide daily  . Itching    down left arm and leg  . Joint pain   . Macular degeneration    mild,beginning  . Multiple myeloma ([place]) 08/13/2014  . Myocardial infarction ([place]) 2010  . Neck pain   . Pain in limb 06/20/2012  . Pneumonia    hx of---12+yrs ago   Past Surgical History: Past Surgical History:  Procedure Laterality Date  . ANTERIOR CERVICAL DECOMP/DISCECTOMY FUSION  01/31/2012   Procedure: ANTERIOR CERVICAL DECOMPRESSION/DISCECTOMY FUSION 1 LEVEL;  Surgeon: Floyce Stakes, MD;  Location: [facility];  Service: Neurosurgery;  Laterality: N/A;  Cervical Five-Six  Anterior Cervical Decompression /Diskectomy /Fusion/Plate  . BONE MARROW TRANSPLANT    . CARDIAC CATHETERIZATION  2010/2013   06/07/11 [facility]) LAD 25%, D2 75% (small), pLCx 100%, RCA 25%--medical managment   . COLONOSCOPY    . HERNIA REPAIR  2006   x 3   .  salivary gland removed     left   . skin cancer removed from left side of face     basal cell carcinoma  . vein removed from left leg     Social History: Social History   Socioeconomic History  . Marital status: Married    Spouse name: Not on file  . Number of children: 3  . Years of education: Not on file  . Highest education level: Not on file  Occupational History  . Not on file  Social Needs  . Financial resource strain: Not on file  . Food insecurity    Worry: Not on file    Inability: Not on file  . Transportation needs    Medical: Not on file    Non-medical: Not on file  Tobacco Use  . Smoking status: Former Smoker    Years: 30.00    Types: Pipe    Quit date: 11/24/2011    Years since quitting: 6.9  . Smokeless tobacco: Former Systems developer    Quit date: 02/15/2011  . Tobacco comment: quit tobacco 3 years ago  Substance and Sexual Activity  . Alcohol use: Yes    Alcohol/week: 0.0 standard drinks    Comment: 1-2 glasses of wine daily  . Drug use: No  . Sexual activity: Yes  Lifestyle  .  Physical activity    Days per week: Not on file    Minutes per session: Not on file  . Stress: Not on file  Relationships  . Social Herbalist on phone: Not on file    Gets together: Not on file    Attends religious service: Not on file    Active member of club or organization: Not on file    Attends meetings of clubs or organizations: Not on file    Relationship status: Not on file  Other Topics Concern  . Not on file  Social History Narrative  . Not on file   Family History: Family History  Problem Relation Age of Onset  . Hypertension Mother   . Heart attack Father   . Cancer Sister   . Cancer Sister    Allergies: Allergies  Allergen Reactions  . Budesonide-Formoterol Fumarate Itching and Other (See Comments)  . Codeine Itching  . Hydrocodone-Acetaminophen Itching  . Mushroom Extract Complex Nausea And Vomiting and Other (See Comments)    Only shitake  mushroom  causes this reaction. Flu-like symptoms from portabello mushrooms   Medications: See med rec.  Review of Systems: No fevers, chills, night sweats, weight loss, chest pain, or shortness of breath.   Objective:    General: Well Developed, well nourished, and in no acute distress.  Neuro: Alert and oriented x3, extra-ocular muscles intact, sensation grossly intact.  HEENT: Normocephalic, atraumatic, pupils equal round reactive to light, neck supple, no masses, no lymphadenopathy, thyroid nonpalpable.  Skin: Warm and dry, no rashes. Cardiac: Regular rate and rhythm, no murmurs rubs or gallops, no lower extremity edema.  Respiratory: Clear to auscultation bilaterally. Not using accessory muscles, speaking in full sentences.  Impression and Recommendations:    Ingrown toenail of right foot Only partial improvement after triple antibiotics. He will return sometime early October for medial and lateral nail plate excision with phenol matricectomy.   ___________________________________________ Gwen Her. Dianah Field, M.D., ABFM., CAQSM. Primary Care and Sports Medicine [facility]  Adjunct Professor of [employer]

## 2018-11-08 NOTE — Assessment & Plan Note (Signed)
Only partial improvement after triple antibiotics. He will return sometime early October for medial and lateral nail plate excision with phenol matricectomy.

## 2018-11-09 DIAGNOSIS — H26491 Other secondary cataract, right eye: Secondary | ICD-10-CM | POA: Diagnosis not present

## 2018-11-13 DIAGNOSIS — D0339 Melanoma in situ of other parts of face: Secondary | ICD-10-CM | POA: Diagnosis not present

## 2018-11-13 DIAGNOSIS — L905 Scar conditions and fibrosis of skin: Secondary | ICD-10-CM | POA: Diagnosis not present

## 2018-11-26 ENCOUNTER — Inpatient Hospital Stay: Payer: Medicare Other | Attending: Hematology & Oncology

## 2018-11-26 ENCOUNTER — Other Ambulatory Visit: Payer: Self-pay

## 2018-11-26 ENCOUNTER — Other Ambulatory Visit: Payer: Self-pay | Admitting: Family

## 2018-11-26 DIAGNOSIS — C9000 Multiple myeloma not having achieved remission: Secondary | ICD-10-CM | POA: Diagnosis not present

## 2018-11-26 DIAGNOSIS — M899 Disorder of bone, unspecified: Secondary | ICD-10-CM

## 2018-11-26 DIAGNOSIS — D500 Iron deficiency anemia secondary to blood loss (chronic): Secondary | ICD-10-CM

## 2018-11-26 DIAGNOSIS — C9001 Multiple myeloma in remission: Secondary | ICD-10-CM

## 2018-11-29 ENCOUNTER — Inpatient Hospital Stay: Payer: Medicare Other

## 2018-12-03 DIAGNOSIS — C9001 Multiple myeloma in remission: Secondary | ICD-10-CM | POA: Diagnosis not present

## 2018-12-28 ENCOUNTER — Ambulatory Visit (INDEPENDENT_AMBULATORY_CARE_PROVIDER_SITE_OTHER): Payer: Medicare Other

## 2018-12-28 ENCOUNTER — Encounter: Payer: Self-pay | Admitting: Sports Medicine

## 2018-12-28 ENCOUNTER — Ambulatory Visit (INDEPENDENT_AMBULATORY_CARE_PROVIDER_SITE_OTHER): Payer: Medicare Other | Admitting: Sports Medicine

## 2018-12-28 ENCOUNTER — Other Ambulatory Visit: Payer: Self-pay

## 2018-12-28 DIAGNOSIS — M7542 Impingement syndrome of left shoulder: Secondary | ICD-10-CM

## 2018-12-28 DIAGNOSIS — M19012 Primary osteoarthritis, left shoulder: Secondary | ICD-10-CM | POA: Diagnosis not present

## 2018-12-28 DIAGNOSIS — M25512 Pain in left shoulder: Secondary | ICD-10-CM | POA: Diagnosis not present

## 2018-12-28 NOTE — Progress Notes (Signed)
Subjective:    CC: Left shoulder pain  HPI: [name] [name] is a pleasant 78 year old male, we have been treating for left shoulder pain for some time now, this was last treated in 2016 with a subacromial injection and physical therapy which worked very well.  He now has a recurrence of pain, moderate, persistent, localized over the deltoid and worse with overhead activities, keeping him up from sleeping.  No paresthesias or neck pain right now.  I reviewed the past medical history, family history, social history, surgical history, and allergies today and no changes were needed.  Please see the problem list section below in epic for further details.  Past Medical History: Past Medical History:  Diagnosis Date  . Acute deep vein thrombosis (DVT) of tibial vein of right lower extremity ([place]) 09/29/2014  . Arthritis    back  . Asthma    uses Advair bid and asmanex prn  . Cancer ([place])    multiple myeloma  . Chronic back pain    buldging disc and scoliosis  . Chronic bronchitis ([place])    couple of yrs ago  . Complication of anesthesia    severe case of hiccups after hernia surgery;required medication  . Diverticulosis   . Dizziness    related to neck issues  . Emphysema   . Enlarged prostate    takes Flomax daily  . GERD (gastroesophageal reflux disease)    takes PRotonix daily  . History of colon polyps   . Hyperlipidemia    takes Zocor daily  . Hypertension    takes HCTZ,Diovan daily and Isosorbide daily  . Itching    down left arm and leg  . Joint pain   . Macular degeneration    mild,beginning  . Multiple myeloma ([place]) 08/13/2014  . Myocardial infarction ([place]) 2010  . Neck pain   . Pain in limb 06/20/2012  . Pneumonia    hx of---12+yrs ago   Past Surgical History: Past Surgical History:  Procedure Laterality Date  . ANTERIOR CERVICAL DECOMP/DISCECTOMY FUSION  01/31/2012   Procedure: ANTERIOR CERVICAL DECOMPRESSION/DISCECTOMY FUSION 1 LEVEL;  Surgeon: Floyce Stakes, MD;   Location: [facility];  Service: Neurosurgery;  Laterality: N/A;  Cervical Five-Six  Anterior Cervical Decompression /Diskectomy /Fusion/Plate  . BONE MARROW TRANSPLANT    . CARDIAC CATHETERIZATION  2010/2013   06/07/11 [facility]) LAD 25%, D2 75% (small), pLCx 100%, RCA 25%--medical managment   . COLONOSCOPY    . HERNIA REPAIR  2006   x 3   . salivary gland removed     left   . skin cancer removed from left side of face     basal cell carcinoma  . vein removed from left leg     Social History: Social History   Socioeconomic History  . Marital status: Married    Spouse name: Not on file  . Number of children: 3  . Years of education: Not on file  . Highest education level: Not on file  Occupational History  . Not on file  Social Needs  . Financial resource strain: Not on file  . Food insecurity    Worry: Not on file    Inability: Not on file  . Transportation needs    Medical: Not on file    Non-medical: Not on file  Tobacco Use  . Smoking status: Former Smoker    Years: 30.00    Types: Pipe    Quit date: 11/24/2011    Years since quitting: 7.0  .  Smokeless tobacco: Former Systems developer    Quit date: 02/15/2011  . Tobacco comment: quit tobacco 3 years ago  Substance and Sexual Activity  . Alcohol use: Yes    Alcohol/week: 0.0 standard drinks    Comment: 1-2 glasses of wine daily  . Drug use: No  . Sexual activity: Yes  Lifestyle  . Physical activity    Days per week: Not on file    Minutes per session: Not on file  . Stress: Not on file  Relationships  . Social Herbalist on phone: Not on file    Gets together: Not on file    Attends religious service: Not on file    Active member of club or organization: Not on file    Attends meetings of clubs or organizations: Not on file    Relationship status: Not on file  Other Topics Concern  . Not on file  Social History Narrative  . Not on file   Family History: Family History  Problem Relation Age of Onset  .  Hypertension Mother   . Heart attack Father   . Cancer Sister   . Cancer Sister    Allergies: Allergies  Allergen Reactions  . Budesonide-Formoterol Fumarate Itching and Other (See Comments)  . Codeine Itching  . Hydrocodone-Acetaminophen Itching  . Mushroom Extract Complex Nausea And Vomiting and Other (See Comments)    Only shitake mushroom  causes this reaction. Flu-like symptoms from portabello mushrooms   Medications: See med rec.  Review of Systems: No fevers, chills, night sweats, weight loss, chest pain, or shortness of breath.   Objective:    General: Well Developed, well nourished, and in no acute distress.  Neuro: Alert and oriented x3, extra-ocular muscles intact, sensation grossly intact.  HEENT: Normocephalic, atraumatic, pupils equal round reactive to light, neck supple, no masses, no lymphadenopathy, thyroid nonpalpable.  Skin: Warm and dry, no rashes. Cardiac: Regular rate and rhythm, no murmurs rubs or gallops, no lower extremity edema.  Respiratory: Clear to auscultation bilaterally. Not using accessory muscles, speaking in full sentences. Left shoulder: Inspection reveals no abnormalities, atrophy or asymmetry. Palpation is normal with no tenderness over AC joint or bicipital groove. ROM is full in all planes. Rotator cuff strength normal throughout. Positive Neer and Hawkin's tests, empty can. Speeds and Yergason's tests normal. No labral pathology noted with negative Obrien's, negative crank, negative clunk, and good stability. Normal scapular function observed. No painful arc and no drop arm sign. No apprehension sign  Procedure: Real-time Ultrasound Guided injection of the left subacromial bursa Device: Samsung HS60  Verbal informed consent obtained.  Time-out conducted.  Noted no overlying erythema, induration, or other signs of local infection.  Skin prepped in a sterile fashion.  Local anesthesia: Topical Ethyl chloride.  With sterile technique  and under real time ultrasound guidance: 1 cc Kenalog 40,1 cc lidocaine, 1 cc bupivacaine injected easily Completed without difficulty  Pain immediately resolved suggesting accurate placement of the medication.  Advised to call if fevers/chills, erythema, induration, drainage, or persistent bleeding.  Images permanently stored and available for review in the ultrasound unit.  Impression: Technically successful ultrasound guided injection.  Impression and Recommendations:    Impingement syndrome of left shoulder Responded well to a subacromial injection and physical therapy 4 years ago, repeat subacromial injection today, referral for formal physical therapy, repeating left shoulder x-ray, return to see me in 4 to 6 weeks.   ___________________________________________ Gwen Her. Dianah Field, M.D., ABFM., CAQSM. Primary Care and  Sports Medicine [facility]  Adjunct Professor of [employer]

## 2018-12-28 NOTE — Assessment & Plan Note (Signed)
Responded well to a subacromial injection and physical therapy 4 years ago, repeat subacromial injection today, referral for formal physical therapy, repeating left shoulder x-ray, return to see me in 4 to 6 weeks.

## 2019-01-01 ENCOUNTER — Encounter: Payer: Self-pay | Admitting: Sports Medicine

## 2019-01-04 ENCOUNTER — Encounter: Payer: Self-pay | Admitting: Sports Medicine

## 2019-01-04 ENCOUNTER — Telehealth: Payer: Medicare Other | Admitting: Nurse Practitioner

## 2019-01-04 DIAGNOSIS — J029 Acute pharyngitis, unspecified: Secondary | ICD-10-CM | POA: Diagnosis not present

## 2019-01-04 MED ORDER — AMOXICILLIN 500 MG PO CAPS: 500.0000 mg | ORAL_CAPSULE | Freq: Two times a day (BID) | ORAL | 0 refills | Status: DC

## 2019-01-04 NOTE — Progress Notes (Signed)
We are sorry that you are not feeling well.  Here is how we plan to help!  Based on what you have shared with me it is likely that you may have strep pharyngitis.  Strep pharyngitis is inflammation and infection in the back of the throat.  This is an infection cause by bacteria and is treated with antibiotics.  I have prescribed Amoxicillin 500 mg twice a day for 10 days.   I also recommend COVID-19 testing at this time.  Many health care providers can now test patients at their office but not all are.  [facility] has multiple testing sites. For information on our COVID testing locations and hours go to [email or web address]  For throat pain, we recommend over the counter oral pain relief medications such as acetaminophen or aspirin, or anti-inflammatory medications such as ibuprofen or naproxen sodium. Topical treatments such as oral throat lozenges or sprays may be used as needed. Strep infections are not as easily transmitted as other respiratory infections, however we still recommend that you avoid close contact with loved ones, especially the very young and elderly.  Remember to wash your hands thoroughly throughout the day as this is the number one way to prevent the spread of infection and wipe down door knobs and counters with disinfectant.   Home Care:  Only take medications as instructed by your medical team.  Complete the entire course of an antibiotic.  Do not take these medications with alcohol.  A steam or ultrasonic humidifier can help congestion.  You can place a towel over your head and breathe in the steam from hot water coming from a faucet.  Avoid close contacts especially the very young and the elderly.  Cover your mouth when you cough or sneeze.  Always remember to wash your hands.  Get Help Right Away If:  You develop worsening fever or sinus pain.  You develop a severe head ache or visual changes.  Your symptoms persist after you have  completed your treatment plan.  Make sure you  Understand these instructions.  Will watch your condition.  Will get help right away if you are not doing well or get worse.  Your e-visit answers were reviewed by a board certified advanced clinical practitioner to complete your personal care plan.  Depending on the condition, your plan could have included both over the counter or prescription medications.  If there is a problem please reply  once you have received a response from your provider.  Your safety is important to [place].  If you have drug allergies check your prescription carefully.    You can use MyChart to ask questions about today's visit, request a non-urgent call back, or ask for a work or school excuse for 24 hours related to this e-Visit. If it has been greater than 24 hours you will need to follow up with your provider, or enter a new e-Visit to address those concerns.  You will get an e-mail in the next two days asking about your experience.  I hope that your e-visit has been valuable and will speed your recovery. Thank you for using e-visits.  I have spent at least 5 minutes reviewing and documenting in the patient's chart. E-Visit for [employer] Virus Screening

## 2019-01-08 ENCOUNTER — Other Ambulatory Visit: Payer: Self-pay

## 2019-01-16 ENCOUNTER — Ambulatory Visit: Payer: Medicare Other | Admitting: Hematology & Oncology

## 2019-01-16 ENCOUNTER — Ambulatory Visit: Payer: Medicare Other

## 2019-01-16 ENCOUNTER — Other Ambulatory Visit: Payer: Medicare Other

## 2019-01-17 ENCOUNTER — Inpatient Hospital Stay: Payer: Medicare Other

## 2019-01-17 ENCOUNTER — Other Ambulatory Visit: Payer: Self-pay

## 2019-01-17 ENCOUNTER — Encounter: Payer: Self-pay | Admitting: Hematology & Oncology

## 2019-01-17 ENCOUNTER — Inpatient Hospital Stay (HOSPITAL_BASED_OUTPATIENT_CLINIC_OR_DEPARTMENT_OTHER): Payer: Medicare Other | Admitting: Hematology & Oncology

## 2019-01-17 ENCOUNTER — Inpatient Hospital Stay: Payer: Medicare Other | Attending: Hematology & Oncology

## 2019-01-17 VITALS — BP 126/70 | HR 62 | Temp 97.6°F | Resp 18 | Wt 210.0 lb

## 2019-01-17 DIAGNOSIS — C9000 Multiple myeloma not having achieved remission: Secondary | ICD-10-CM

## 2019-01-17 DIAGNOSIS — M899 Disorder of bone, unspecified: Secondary | ICD-10-CM

## 2019-01-17 DIAGNOSIS — C9001 Multiple myeloma in remission: Secondary | ICD-10-CM | POA: Insufficient documentation

## 2019-01-17 DIAGNOSIS — D500 Iron deficiency anemia secondary to blood loss (chronic): Secondary | ICD-10-CM

## 2019-01-17 LAB — CBC WITH DIFFERENTIAL (CANCER CENTER ONLY)
Abs Immature Granulocytes: 0.03 10*3/uL (ref 0.00–0.07)
Basophils Absolute: 0 10*3/uL (ref 0.0–0.1)
Basophils Relative: 1 %
Eosinophils Absolute: 0.2 10*3/uL (ref 0.0–0.5)
Eosinophils Relative: 3 %
HCT: 44 % (ref 39.0–52.0)
Hemoglobin: 14.8 g/dL (ref 13.0–17.0)
Immature Granulocytes: 0 %
Lymphocytes Relative: 27 %
Lymphs Abs: 2.1 10*3/uL (ref 0.7–4.0)
MCH: 30 pg (ref 26.0–34.0)
MCHC: 33.6 g/dL (ref 30.0–36.0)
MCV: 89.1 fL (ref 80.0–100.0)
Monocytes Absolute: 0.8 10*3/uL (ref 0.1–1.0)
Monocytes Relative: 10 %
Neutro Abs: 4.8 10*3/uL (ref 1.7–7.7)
Neutrophils Relative %: 59 %
Platelet Count: 160 10*3/uL (ref 150–400)
RBC: 4.94 MIL/uL (ref 4.22–5.81)
RDW: 13.4 % (ref 11.5–15.5)
WBC Count: 8 10*3/uL (ref 4.0–10.5)
nRBC: 0 % (ref 0.0–0.2)

## 2019-01-17 LAB — CMP (CANCER CENTER ONLY)
ALT: 40 U/L (ref 0–44)
AST: 23 U/L (ref 15–41)
Albumin: 4.4 g/dL (ref 3.5–5.0)
Alkaline Phosphatase: 108 U/L (ref 38–126)
Anion gap: 7 (ref 5–15)
BUN: 17 mg/dL (ref 8–23)
CO2: 30 mmol/L (ref 22–32)
Calcium: 9.2 mg/dL (ref 8.9–10.3)
Chloride: 103 mmol/L (ref 98–111)
Creatinine: 1.09 mg/dL (ref 0.61–1.24)
GFR, Est AFR Am: >60 mL/min (ref >60)
GFR, Estimated: >60 mL/min (ref >60)
Glucose, Bld: 83 mg/dL (ref 70–99)
Potassium: 3.6 mmol/L (ref 3.5–5.1)
Sodium: 140 mmol/L (ref 135–145)
Total Bilirubin: 0.7 mg/dL (ref 0.3–1.2)
Total Protein: 7.1 g/dL (ref 6.5–8.1)

## 2019-01-17 LAB — FERRITIN: Ferritin: 153 ng/mL (ref 24–336)

## 2019-01-17 LAB — IRON AND TIBC
Iron: 81 ug/dL (ref 42–163)
Saturation Ratios: 26 % (ref 20–55)
TIBC: 313 ug/dL (ref 202–409)
UIBC: 232 ug/dL (ref 117–376)

## 2019-01-17 MED ORDER — SODIUM CHLORIDE 0.9 % IV SOLN
INTRAVENOUS | Status: DC
  Administered 2019-01-17: 12:00:00 via INTRAVENOUS
  Filled 2019-01-17: qty 250

## 2019-01-17 MED ORDER — ZOLEDRONIC ACID 4 MG/5ML IV CONC
4.0000 mg | Freq: Once | INTRAVENOUS | Status: AC
  Administered 2019-01-17: 4 mg via INTRAVENOUS
  Filled 2019-01-17: qty 5

## 2019-01-17 NOTE — Patient Instructions (Signed)
Zoledronic Acid injection (Hypercalcemia, Oncology) What is this medicine? ZOLEDRONIC ACID (ZOE le dron ik AS id) lowers the amount of calcium loss from bone. It is used to treat too much calcium in your blood from cancer. It is also used to prevent complications of cancer that has spread to the bone. This medicine may be used for other purposes; ask your health care provider or pharmacist if you have questions. COMMON BRAND NAME(S): Zometa What should I tell my health care provider before I take this medicine? They need to know if you have any of these conditions:  aspirin-sensitive asthma  cancer, especially if you are receiving medicines used to treat cancer  dental disease or wear dentures  infection  kidney disease  receiving corticosteroids like dexamethasone or prednisone  an unusual or allergic reaction to zoledronic acid, other medicines, foods, dyes, or preservatives  pregnant or trying to get pregnant  breast-feeding How should I use this medicine? This medicine is for infusion into a vein. It is given by a health care professional in a hospital or clinic setting. Talk to your pediatrician regarding the use of this medicine in children. Special care may be needed. Overdosage: If you think you have taken too much of this medicine contact a poison control center or emergency room at once. NOTE: This medicine is only for you. Do not share this medicine with others. What if I miss a dose? It is important not to miss your dose. Call your doctor or health care professional if you are unable to keep an appointment. What may interact with this medicine?  certain antibiotics given by injection  NSAIDs, medicines for pain and inflammation, like ibuprofen or naproxen  some diuretics like bumetanide, furosemide  teriparatide  thalidomide This list may not describe all possible interactions. Give your health care provider a list of all the medicines, herbs, non-prescription  drugs, or dietary supplements you use. Also tell them if you smoke, drink alcohol, or use illegal drugs. Some items may interact with your medicine. What should I watch for while using this medicine? Visit your doctor or health care professional for regular checkups. It may be some time before you see the benefit from this medicine. Do not stop taking your medicine unless your doctor tells you to. Your doctor may order blood tests or other tests to see how you are doing. Women should inform their doctor if they wish to become pregnant or think they might be pregnant. There is a potential for serious side effects to an unborn child. Talk to your health care professional or pharmacist for more information. You should make sure that you get enough calcium and vitamin D while you are taking this medicine. Discuss the foods you eat and the vitamins you take with your health care professional. Some people who take this medicine have severe bone, joint, and/or muscle pain. This medicine may also increase your risk for jaw problems or a broken thigh bone. Tell your doctor right away if you have severe pain in your jaw, bones, joints, or muscles. Tell your doctor if you have any pain that does not go away or that gets worse. Tell your dentist and dental surgeon that you are taking this medicine. You should not have major dental surgery while on this medicine. See your dentist to have a dental exam and fix any dental problems before starting this medicine. Take good care of your teeth while on this medicine. Make sure you see your dentist for regular follow-up   appointments. What side effects may I notice from receiving this medicine? Side effects that you should report to your doctor or health care professional as soon as possible:  allergic reactions like skin rash, itching or hives, swelling of the face, lips, or tongue  anxiety, confusion, or depression  breathing problems  changes in vision  eye  pain  feeling faint or lightheaded, falls  jaw pain, especially after dental work  mouth sores  muscle cramps, stiffness, or weakness  redness, blistering, peeling or loosening of the skin, including inside the mouth  trouble passing urine or change in the amount of urine Side effects that usually do not require medical attention (report to your doctor or health care professional if they continue or are bothersome):  bone, joint, or muscle pain  constipation  diarrhea  fever  hair loss  irritation at site where injected  loss of appetite  nausea, vomiting  stomach upset  trouble sleeping  trouble swallowing  weak or tired This list may not describe all possible side effects. Call your doctor for medical advice about side effects. You may report side effects to FDA at 1-800-FDA-1088. Where should I keep my medicine? This drug is given in a hospital or clinic and will not be stored at home. NOTE: This sheet is a summary. It may not cover all possible information. If you have questions about this medicine, talk to your doctor, pharmacist, or health care provider.  2020 Elsevier/Gold Standard (2013-06-29 14:19:39)  

## 2019-01-17 NOTE — Progress Notes (Signed)
Hematology and Oncology Follow Up Visit  [name] [name] [id number] 18-Jul-1940 78 y.o. 01/17/2019   Principle Diagnosis:  IgG kappa myeloma - Trisomy 11 by FISH Acute thromboembolism of the right lower leg  Past Therapy:        S/P ASCT at [place] on 04/02/2015 Patient s/p cycle 5 of Velcade/Revlimid/Decadron Revlimid 10mg  po q day (21/28) -- d/c on 09/15/2017  Current Therapy:   Zometa 4 mg IV every3 months  - next dose due 04/2019 Xarelto 10 mg by mouth daily   Interim History:  Mr. Sklar is here today for follow-up and Zometa.  Thankfully, he is still doing okay.  His wife is doing fine.  Unfortunately, her family, who lives in [place], are having a tough time with the coronavirus.  He is having problems with his left shoulder.  He goes to see his family doctor.  He gets some injections.  Sounds like he may have bicipital tendinitis.  As far as myeloma is concerned, he is doing great.  Will be almost 4 years that he had his transplant at [facility]  There is still no monoclonal spike in his blood.  He has had back issues.  He was seeing the chiropractor but with the coronavirus, he is pulled back on this.  He has had no issues with fever.  He has had no cough.  He has had no bleeding.  He has had no change in bowel or bladder habits.  He and his wife had a quiet Thanksgiving.   Overall, his performance status is ECOG 0.   Medications:  Allergies as of 01/17/2019      Reactions   Budesonide-formoterol Fumarate Itching, Other (See Comments)   Codeine Itching   Hydrocodone-acetaminophen Itching   Mushroom Extract Complex Nausea And Vomiting, Other (See Comments)   Only shitake mushroom  causes this reaction. Flu-like symptoms from portabello mushrooms      Medication List       Accurate as of January 17, 2019 11:14 AM. If you have any questions, ask your nurse or doctor.        [name] 1 each by Does not apply route 5 (five) times daily. Use frequently, at least  5x a day.   amLODipine 5 MG tablet Commonly known as: NORVASC Take 1 tablet (5 mg total) by mouth daily.   aspirin EC 81 MG tablet Take by mouth.   carvedilol 6.25 MG tablet Commonly known as: COREG Take 1 tablet (6.25 mg total) by mouth 2 (two) times daily with a meal.   EQ Allergy Relief (Cetirizine) 10 MG tablet Generic drug: cetirizine Take 1 tablet by mouth once daily   esomeprazole 40 MG capsule Commonly known as: NEXIUM Take 1 capsule (40 mg total) by mouth daily at 12 noon.   famciclovir 500 MG tablet Commonly known as: FAMVIR Take 1 tablet (500 mg total) by mouth daily.   fluticasone 50 MCG/ACT nasal spray Commonly known as: FLONASE Place 2 sprays into both nostrils daily.   gabapentin 600 MG tablet Commonly known as: NEURONTIN TAKE 1 TABLET EVERY MORNING  AND TAKE 2 TABLETS EVERY NIGHT   hydrochlorothiazide 12.5 MG capsule Commonly known as: MICROZIDE Take 1 capsule (12.5 mg total) by mouth daily.   isosorbide mononitrate 30 MG 24 hr tablet Commonly known as: IMDUR Take 1 tablet (30 mg total) by mouth 2 (two) times daily.   Klor-Con M20 20 MEQ tablet Generic drug: potassium chloride SA TAKE 1 TABLET TWICE A DAY   levothyroxine  88 MCG tablet Commonly known as: SYNTHROID Take 1 tablet (88 mcg total) by mouth daily before breakfast.   LORazepam 1 MG tablet Commonly known as: ATIVAN Take 1 tablet (1 mg total) by mouth at bedtime.   nitroGLYCERIN 0.4 MG SL tablet Commonly known as: NITROSTAT Place 0.4 mg under the tongue every 5 (five) minutes as needed. For chest pain   oxyCODONE-acetaminophen 10-325 MG tablet Commonly known as: PERCOCET Take 1 tablet by mouth every 8 (eight) hours as needed.   pyridoxine 100 MG tablet Commonly known as: B-6 Take 100 mg by mouth daily.   RA Vitamin B-12 TR 1000 MCG Tbcr Generic drug: Cyanocobalamin Take 1 tablet by mouth daily.   rivaroxaban 10 MG Tabs tablet Commonly known as: XARELTO Take 1 tablet (10 mg  total) by mouth daily with supper.   tamsulosin 0.4 MG Caps capsule Commonly known as: FLOMAX Take 1 capsule by mouth once daily   traMADol 50 MG tablet Commonly known as: ULTRAM Take 50 mg by mouth as needed.       Allergies:  Allergies  Allergen Reactions  . Budesonide-Formoterol Fumarate Itching and Other (See Comments)  . Codeine Itching  . Hydrocodone-Acetaminophen Itching  . Mushroom Extract Complex Nausea And Vomiting and Other (See Comments)    Only shitake mushroom  causes this reaction. Flu-like symptoms from portabello mushrooms    Past Medical History, Surgical history, Social history, and Family History were reviewed and updated.  Review of Systems: Review of Systems  Constitutional: Negative.   HENT: Negative.   Eyes: Negative.   Respiratory: Negative.   Cardiovascular: Negative.   Gastrointestinal: Negative.   Genitourinary: Negative.   Musculoskeletal: Negative.   Skin: Negative.   Neurological: Negative.   Endo/Heme/Allergies: Negative.   Psychiatric/Behavioral: Negative.    Marland Kitchen   Physical Exam:  weight is 210 lb (95.3 kg). His temporal temperature is 97.6 F (36.4 C). His blood pressure is 126/70 and his pulse is 62. His respiration is 18 and oxygen saturation is 96%.   Wt Readings from Last 3 Encounters:  01/17/19 210 lb (95.3 kg)  11/08/18 213 lb (96.6 kg)  10/17/18 222 lb (100.7 kg)    Physical Exam Vitals signs reviewed.  HENT:     Head: Normocephalic and atraumatic.  Eyes:     Pupils: Pupils are equal, round, and reactive to light.  Neck:     Musculoskeletal: Normal range of motion.  Cardiovascular:     Rate and Rhythm: Normal rate and regular rhythm.     Heart sounds: Normal heart sounds.  Pulmonary:     Effort: Pulmonary effort is normal.     Breath sounds: Normal breath sounds.  Abdominal:     General: Bowel sounds are normal.     Palpations: Abdomen is soft.  Musculoskeletal: Normal range of motion.        General: No  tenderness or deformity.  Lymphadenopathy:     Cervical: No cervical adenopathy.  Skin:    General: Skin is warm and dry.     Findings: No erythema or rash.  Neurological:     Mental Status: He is alert and oriented to person, place, and time.  Psychiatric:        Behavior: Behavior normal.        Thought Content: Thought content normal.        Judgment: Judgment normal.      Lab Results  Component Value Date   WBC 8.0 01/17/2019   HGB 14.8 01/17/2019  HCT 44.0 01/17/2019   MCV 89.1 01/17/2019   PLT 160 01/17/2019   Lab Results  Component Value Date   FERRITIN 73 10/17/2018   IRON 118 10/17/2018   TIBC 290 10/17/2018   UIBC 172 10/17/2018   IRONPCTSAT 41 10/17/2018   Lab Results  Component Value Date   RETICCTPCT 1.3 02/02/2018   RBC 4.94 01/17/2019   Lab Results  Component Value Date   KPAFRELGTCHN 14.8 10/17/2018   LAMBDASER 13.6 10/17/2018   KAPLAMBRATIO 1.09 10/17/2018   Lab Results  Component Value Date   IGGSERUM 848 10/17/2018   IGA 58 (L) 10/17/2018   IGMSERUM 45 10/17/2018   Lab Results  Component Value Date   TOTALPROTELP 5.6 (L) 10/17/2018   ALBUMINELP 3.5 10/17/2018   A1GS 0.2 10/17/2018   A2GS 0.5 10/17/2018   BETS 0.7 10/17/2018   BETA2SER 0.3 02/11/2015   GAMS 0.7 10/17/2018   MSPIKE Not Observed 10/17/2018   SPEI Comment 10/17/2018     Chemistry      Component Value Date/Time   NA 140 01/17/2019 0958   NA 140 01/27/2017 1044   NA 141 06/05/2015 1036   K 3.6 01/17/2019 0958   K 3.3 01/27/2017 1044   K 2.9 (LL) 06/05/2015 1036   CL 103 01/17/2019 0958   CL 101 01/27/2017 1044   CO2 30 01/17/2019 0958   CO2 28 01/27/2017 1044   CO2 27 06/05/2015 1036   BUN 17 01/17/2019 0958   BUN 14 01/27/2017 1044   BUN 13.1 06/05/2015 1036   CREATININE 1.09 01/17/2019 0958   CREATININE 1.16 10/08/2018 1531   CREATININE 1.0 06/05/2015 1036   GLU 152 03/30/2016      Component Value Date/Time   CALCIUM 9.2 01/17/2019 0958   CALCIUM 8.9  01/27/2017 1044   CALCIUM 9.4 06/05/2015 1036   ALKPHOS 108 01/17/2019 0958   ALKPHOS 126 (H) 01/27/2017 1044   ALKPHOS 94 06/05/2015 1036   AST 23 01/17/2019 0958   AST 22 06/05/2015 1036   ALT 40 01/17/2019 0958   ALT 33 01/27/2017 1044   ALT 18 06/05/2015 1036   BILITOT 0.7 01/17/2019 0958   BILITOT 0.59 06/05/2015 1036       Impression and Plan: Mr. Uva is a very pleasant 78 yo caucasian gentleman with history of IgG kappa myeloma.  He underwent standard induction chemotherapy and then ultimately underwent [facility] in [facility] 2017.   I see no evidence of disease recurrence.  I suppose this could always be a problem.  However, I would be surprised.  We will plan to get her back in 3 more months.  He will get his Zometa when he comes back to see [place].     [name], MD 12/3/202011:14 AM

## 2019-01-18 LAB — PROTEIN ELECTROPHORESIS, SERUM
A/G Ratio: 1.3 (ref 0.7–1.7)
Albumin ELP: 3.8 g/dL (ref 2.9–4.4)
Alpha-1-Globulin: 0.2 g/dL (ref 0.0–0.4)
Alpha-2-Globulin: 0.8 g/dL (ref 0.4–1.0)
Beta Globulin: 1 g/dL (ref 0.7–1.3)
Gamma Globulin: 0.8 g/dL (ref 0.4–1.8)
Globulin, Total: 2.9 g/dL (ref 2.2–3.9)
Total Protein ELP: 6.7 g/dL (ref 6.0–8.5)

## 2019-01-18 LAB — KAPPA/LAMBDA LIGHT CHAINS
Kappa free light chain: 13.6 mg/L (ref 3.3–19.4)
Kappa, lambda light chain ratio: 0.67 (ref 0.26–1.65)
Lambda free light chains: 20.4 mg/L (ref 5.7–26.3)

## 2019-01-18 LAB — IGG, IGA, IGM
IgA: 54 mg/dL — ABNORMAL LOW (ref 61–437)
IgG (Immunoglobin G), Serum: 841 mg/dL (ref 603–1613)
IgM (Immunoglobulin M), Srm: 42 mg/dL (ref 15–143)

## 2019-01-21 ENCOUNTER — Telehealth: Payer: Self-pay | Admitting: *Deleted

## 2019-01-21 NOTE — Telephone Encounter (Signed)
-----   Message from [name], MD sent at 01/18/2019  4:58 PM EST ----- Call - no active myeloma in the blood!!  [name] [name]

## 2019-01-21 NOTE — Telephone Encounter (Signed)
As noted below by Dr. Marin Olp, I informed the patient that there is no active myeloma in the blood. He verbalized understanding.

## 2019-02-08 ENCOUNTER — Other Ambulatory Visit: Payer: Self-pay | Admitting: Sports Medicine

## 2019-02-10 ENCOUNTER — Encounter: Payer: Self-pay | Admitting: Hematology & Oncology

## 2019-02-11 ENCOUNTER — Other Ambulatory Visit: Payer: Self-pay

## 2019-02-11 ENCOUNTER — Other Ambulatory Visit: Payer: Self-pay | Admitting: *Deleted

## 2019-02-11 ENCOUNTER — Ambulatory Visit (INDEPENDENT_AMBULATORY_CARE_PROVIDER_SITE_OTHER): Payer: Medicare Other | Admitting: Sports Medicine

## 2019-02-11 DIAGNOSIS — I2699 Other pulmonary embolism without acute cor pulmonale: Secondary | ICD-10-CM

## 2019-02-11 DIAGNOSIS — I2510 Atherosclerotic heart disease of native coronary artery without angina pectoris: Secondary | ICD-10-CM

## 2019-02-11 DIAGNOSIS — M19012 Primary osteoarthritis, left shoulder: Secondary | ICD-10-CM

## 2019-02-11 DIAGNOSIS — Z9861 Coronary angioplasty status: Secondary | ICD-10-CM | POA: Diagnosis not present

## 2019-02-11 DIAGNOSIS — M47815 Spondylosis without myelopathy or radiculopathy, thoracolumbar region: Secondary | ICD-10-CM

## 2019-02-11 MED ORDER — RIVAROXABAN 10 MG PO TABS: 10.0000 mg | ORAL_TABLET | Freq: Every day | ORAL | 3 refills | Status: DC

## 2019-02-11 MED ORDER — OXYCODONE-ACETAMINOPHEN 10-325 MG PO TABS: 1.0000 | ORAL_TABLET | Freq: Three times a day (TID) | ORAL | 0 refills | Status: DC | PRN

## 2019-02-11 NOTE — Assessment & Plan Note (Signed)
Glenohumeral injection today, return in a month. He only partially responded to subacromial injection at the last visit.

## 2019-02-11 NOTE — Progress Notes (Signed)
Subjective:    CC: Left shoulder pain  HPI: This is a pleasant 78 year old male, we injected his left subacromial bursa at the last visit, he had a partial improvement in his symptoms, but has mild persistence of pain, localized at the glenohumeral joint.  I reviewed the past medical history, family history, social history, surgical history, and allergies today and no changes were needed.  Please see the problem list section below in epic for further details.  Past Medical History: Past Medical History:  Diagnosis Date  . Acute deep vein thrombosis (DVT) of tibial vein of right lower extremity ([place]) 09/29/2014  . Arthritis    back  . Asthma    uses Advair bid and asmanex prn  . Cancer ([place])    multiple myeloma  . Chronic back pain    buldging disc and scoliosis  . Chronic bronchitis ([place])    couple of yrs ago  . Complication of anesthesia    severe case of hiccups after hernia surgery;required medication  . Diverticulosis   . Dizziness    related to neck issues  . Emphysema   . Enlarged prostate    takes Flomax daily  . GERD (gastroesophageal reflux disease)    takes PRotonix daily  . History of colon polyps   . Hyperlipidemia    takes Zocor daily  . Hypertension    takes HCTZ,Diovan daily and Isosorbide daily  . Itching    down left arm and leg  . Joint pain   . Macular degeneration    mild,beginning  . Multiple myeloma ([place]) 08/13/2014  . Myocardial infarction ([place]) 2010  . Neck pain   . Pain in limb 06/20/2012  . Pneumonia    hx of---12+yrs ago   Past Surgical History: Past Surgical History:  Procedure Laterality Date  . ANTERIOR CERVICAL DECOMP/DISCECTOMY FUSION  01/31/2012   Procedure: ANTERIOR CERVICAL DECOMPRESSION/DISCECTOMY FUSION 1 LEVEL;  Surgeon: Floyce Stakes, MD;  Location: [facility];  Service: Neurosurgery;  Laterality: N/A;  Cervical Five-Six  Anterior Cervical Decompression /Diskectomy /Fusion/Plate  . BONE MARROW TRANSPLANT    . CARDIAC  CATHETERIZATION  2010/2013   06/07/11 [facility]) LAD 25%, D2 75% (small), pLCx 100%, RCA 25%--medical managment   . COLONOSCOPY    . HERNIA REPAIR  2006   x 3   . salivary gland removed     left   . skin cancer removed from left side of face     basal cell carcinoma  . vein removed from left leg     Social History: Social History   Socioeconomic History  . Marital status: Married    Spouse name: Not on file  . Number of children: 3  . Years of education: Not on file  . Highest education level: Not on file  Occupational History  . Not on file  Tobacco Use  . Smoking status: Former Smoker    Years: 30.00    Types: Pipe    Quit date: 11/24/2011    Years since quitting: 7.2  . Smokeless tobacco: Former Systems developer    Quit date: 02/15/2011  . Tobacco comment: quit tobacco 3 years ago  Substance and Sexual Activity  . Alcohol use: Yes    Alcohol/week: 0.0 standard drinks    Comment: 1-2 glasses of wine daily  . Drug use: No  . Sexual activity: Yes  Other Topics Concern  . Not on file  Social History Narrative  . Not on file   Social Determinants of Health  Financial Resource Strain:   . Difficulty of Paying Living Expenses: Not on file  Food Insecurity:   . Worried About Charity fundraiser in the Last Year: Not on file  . Ran Out of Food in the Last Year: Not on file  Transportation Needs:   . Lack of Transportation (Medical): Not on file  . Lack of Transportation (Non-Medical): Not on file  Physical Activity:   . Days of Exercise per Week: Not on file  . Minutes of Exercise per Session: Not on file  Stress:   . Feeling of Stress : Not on file  Social Connections:   . Frequency of Communication with Friends and Family: Not on file  . Frequency of Social Gatherings with Friends and Family: Not on file  . Attends Religious Services: Not on file  . Active Member of Clubs or Organizations: Not on file  . Attends Archivist Meetings: Not on file  . Marital Status:  Not on file   Family History: Family History  Problem Relation Age of Onset  . Hypertension Mother   . Heart attack Father   . Cancer Sister   . Cancer Sister    Allergies: Allergies  Allergen Reactions  . Budesonide-Formoterol Fumarate Itching and Other (See Comments)  . Codeine Itching  . Hydrocodone-Acetaminophen Itching  . Mushroom Extract Complex Nausea And Vomiting and Other (See Comments)    Only shitake mushroom  causes this reaction. Flu-like symptoms from portabello mushrooms   Medications: See med rec.  Review of Systems: No fevers, chills, night sweats, weight loss, chest pain, or shortness of breath.   Objective:    General: Well Developed, well nourished, and in no acute distress.  Neuro: Alert and oriented x3, extra-ocular muscles intact, sensation grossly intact.  HEENT: Normocephalic, atraumatic, pupils equal round reactive to light, neck supple, no masses, no lymphadenopathy, thyroid nonpalpable.  Skin: Warm and dry, no rashes. Cardiac: Regular rate and rhythm, no murmurs rubs or gallops, no lower extremity edema.  Respiratory: Clear to auscultation bilaterally. Not using accessory muscles, speaking in full sentences.  Procedure: Real-time Ultrasound Guided injection of the left glenohumeral joint Device: Samsung HS60  Verbal informed consent obtained.  Time-out conducted.  Noted no overlying erythema, induration, or other signs of local infection.  Skin prepped in a sterile fashion.  Local anesthesia: Topical Ethyl chloride.  With sterile technique and under real time ultrasound guidance: 1 cc Kenalog 40, 2 cc lidocaine, 2 cc bupivacaine injected easily Completed without difficulty  Pain immediately resolved suggesting accurate placement of the medication.  Advised to call if fevers/chills, erythema, induration, drainage, or persistent bleeding.  Images permanently stored and available for review in the ultrasound unit.  Impression: Technically  successful ultrasound guided injection.  Impression and Recommendations:    Primary osteoarthritis of left shoulder Glenohumeral injection today, return in a month. He only partially responded to subacromial injection at the last visit.   ___________________________________________ Gwen Her. Dianah Field, M.D., ABFM., CAQSM. Primary Care and Sports Medicine [facility]  Adjunct Professor of [employer]

## 2019-02-12 ENCOUNTER — Other Ambulatory Visit: Payer: Self-pay | Admitting: Sports Medicine

## 2019-02-12 MED ORDER — LORAZEPAM 1 MG PO TABS: 1.0000 mg | ORAL_TABLET | Freq: Every day | ORAL | 0 refills | Status: DC

## 2019-02-19 DIAGNOSIS — J029 Acute pharyngitis, unspecified: Secondary | ICD-10-CM

## 2019-02-19 MED ORDER — AMOXICILLIN 500 MG PO CAPS: 500.0000 mg | ORAL_CAPSULE | Freq: Two times a day (BID) | ORAL | 0 refills | Status: AC

## 2019-02-25 ENCOUNTER — Ambulatory Visit: Payer: Medicare Other | Attending: Internal Medicine

## 2019-02-25 DIAGNOSIS — Z23 Encounter for immunization: Secondary | ICD-10-CM | POA: Diagnosis not present

## 2019-02-25 NOTE — Progress Notes (Signed)
   Covid-19 Vaccination Clinic  Name:  [name] [name]    MRN: [id number] DOB: 03/02/1940  02/25/2019  [name] was observed post Covid-19 immunization for 15 minutes without incidence. He was provided with Vaccine Information Sheet and instruction to access the V-Safe system.   [name] was instructed to call 911 with any severe reactions post vaccine: Marland Kitchen Difficulty breathing  . Swelling of your face and throat  . A fast heartbeat  . A bad rash all over your body  . Dizziness and weakness

## 2019-03-11 ENCOUNTER — Ambulatory Visit: Payer: Medicare Other | Admitting: Sports Medicine

## 2019-03-17 ENCOUNTER — Ambulatory Visit: Payer: Medicare Other | Attending: Internal Medicine

## 2019-03-17 DIAGNOSIS — Z23 Encounter for immunization: Secondary | ICD-10-CM | POA: Insufficient documentation

## 2019-03-17 NOTE — Progress Notes (Signed)
   Covid-19 Vaccination Clinic  Name:  [name] [name]    MRN: [id number] DOB: [birth year]  03/17/2019  Mr. Mohammad was observed post Covid-19 immunization for 30 minutes based on pre-vaccination screening without incidence. He was provided with Vaccine Information Sheet and instruction to access the V-Safe system.   Mr. Landsberger was instructed to call 911 with any severe reactions post vaccine: Marland Kitchen Difficulty breathing  . Swelling of your face and throat  . A fast heartbeat  . A bad rash all over your body  . Dizziness and weakness    Immunizations Administered    Name Date Dose VIS Date Route   Pfizer COVID-19 Vaccine 03/17/2019 11:08 AM 0.3 mL 01/25/2019 Intramuscular   Manufacturer: [place]   Lot: [id number]   [place]: [id number]

## 2019-04-17 ENCOUNTER — Encounter: Payer: Self-pay | Admitting: Hematology & Oncology

## 2019-04-17 ENCOUNTER — Inpatient Hospital Stay: Payer: Medicare Other | Attending: Hematology & Oncology

## 2019-04-17 ENCOUNTER — Other Ambulatory Visit: Payer: Self-pay

## 2019-04-17 ENCOUNTER — Inpatient Hospital Stay: Payer: Medicare Other

## 2019-04-17 ENCOUNTER — Inpatient Hospital Stay (HOSPITAL_BASED_OUTPATIENT_CLINIC_OR_DEPARTMENT_OTHER): Payer: Medicare Other | Admitting: Hematology & Oncology

## 2019-04-17 VITALS — BP 133/71 | HR 58 | Temp 97.1°F | Resp 20 | Wt 221.0 lb

## 2019-04-17 DIAGNOSIS — C9000 Multiple myeloma not having achieved remission: Secondary | ICD-10-CM | POA: Diagnosis not present

## 2019-04-17 DIAGNOSIS — E039 Hypothyroidism, unspecified: Secondary | ICD-10-CM | POA: Diagnosis not present

## 2019-04-17 DIAGNOSIS — C9001 Multiple myeloma in remission: Secondary | ICD-10-CM | POA: Insufficient documentation

## 2019-04-17 DIAGNOSIS — E034 Atrophy of thyroid (acquired): Secondary | ICD-10-CM

## 2019-04-17 DIAGNOSIS — D500 Iron deficiency anemia secondary to blood loss (chronic): Secondary | ICD-10-CM | POA: Diagnosis not present

## 2019-04-17 DIAGNOSIS — M899 Disorder of bone, unspecified: Secondary | ICD-10-CM

## 2019-04-17 LAB — CMP (CANCER CENTER ONLY)
ALT: 38 U/L (ref 0–44)
AST: 30 U/L (ref 15–41)
Albumin: 4.2 g/dL (ref 3.5–5.0)
Alkaline Phosphatase: 77 U/L (ref 38–126)
Anion gap: 9 (ref 5–15)
BUN: 17 mg/dL (ref 8–23)
CO2: 29 mmol/L (ref 22–32)
Calcium: 9.3 mg/dL (ref 8.9–10.3)
Chloride: 103 mmol/L (ref 98–111)
Creatinine: 1.11 mg/dL (ref 0.61–1.24)
GFR, Est AFR Am: >60 mL/min (ref >60)
GFR, Estimated: >60 mL/min (ref >60)
Glucose, Bld: 109 mg/dL — ABNORMAL HIGH (ref 70–99)
Potassium: 3.2 mmol/L — ABNORMAL LOW (ref 3.5–5.1)
Sodium: 141 mmol/L (ref 135–145)
Total Bilirubin: 0.7 mg/dL (ref 0.3–1.2)
Total Protein: 6.6 g/dL (ref 6.5–8.1)

## 2019-04-17 LAB — CBC WITH DIFFERENTIAL (CANCER CENTER ONLY)
Abs Immature Granulocytes: 0.01 10*3/uL (ref 0.00–0.07)
Basophils Absolute: 0 10*3/uL (ref 0.0–0.1)
Basophils Relative: 1 %
Eosinophils Absolute: 0.3 10*3/uL (ref 0.0–0.5)
Eosinophils Relative: 4 %
HCT: 40.5 % (ref 39.0–52.0)
Hemoglobin: 14 g/dL (ref 13.0–17.0)
Immature Granulocytes: 0 %
Lymphocytes Relative: 33 %
Lymphs Abs: 2.2 10*3/uL (ref 0.7–4.0)
MCH: 31 pg (ref 26.0–34.0)
MCHC: 34.6 g/dL (ref 30.0–36.0)
MCV: 89.6 fL (ref 80.0–100.0)
Monocytes Absolute: 0.7 10*3/uL (ref 0.1–1.0)
Monocytes Relative: 11 %
Neutro Abs: 3.3 10*3/uL (ref 1.7–7.7)
Neutrophils Relative %: 51 %
Platelet Count: 181 10*3/uL (ref 150–400)
RBC: 4.52 MIL/uL (ref 4.22–5.81)
RDW: 13.2 % (ref 11.5–15.5)
WBC Count: 6.6 10*3/uL (ref 4.0–10.5)
nRBC: 0 % (ref 0.0–0.2)

## 2019-04-17 MED ORDER — ZOLEDRONIC ACID 4 MG/100ML IV SOLN
4.0000 mg | Freq: Once | INTRAVENOUS | Status: AC
  Administered 2019-04-17: 4 mg via INTRAVENOUS
  Filled 2019-04-17: qty 100

## 2019-04-17 MED ORDER — CETIRIZINE HCL 10 MG PO TABS: 10.0000 mg | ORAL_TABLET | Freq: Every day | ORAL | 0 refills | Status: DC

## 2019-04-17 NOTE — Progress Notes (Signed)
Hematology and Oncology Follow Up Visit  [name] [name] [id number] Oct 16, 1940 79 y.o. 04/17/2019   Principle Diagnosis:  IgG kappa myeloma - Trisomy 11 by FISH Acute thromboembolism of the right lower leg  Past Therapy:        S/P ASCT at [place] on 04/02/2015 Patient s/p cycle 5 of Velcade/Revlimid/Decadron Revlimid 10mg  po q day (21/28) -- d/c on 09/15/2017  Current Therapy:   Zometa 4 mg IV every3 months  - next dose due 07/2019 Xarelto 10 mg by mouth daily   Interim History:  [name] [name] is here today for follow-up and Zometa.  Thankfully, he is still doing okay.  He had his coronavirus vaccine.  His wife gets her second 1 today.  He is complaining of being a little bit dizzy.  He is gained quite a bit of weight since we last saw him.  I will check his TSH.  He is on Synthroid for hypothyroidism.  There is been absolutely no issues with respect to recurrence of the myeloma.  His wife is doing fine.  Unfortunately, her family, who lives in [place], are having a tough time with the coronavirus.  He is having problems with his left shoulder.  He goes to see his family doctor.  He gets some injections.  Sounds like he may have bicipital tendinitis.  As far as myeloma is concerned, he is doing great.  There is been no M spike in his blood.  He does have a lot of arthritis.  He has pain in his back, shoulder and hips.  He is on oxycodone for this.  This does seem to help.    Overall, his performance status is ECOG 1 this man 5 months he just.   Medications:  Allergies as of 04/17/2019      Reactions   Budesonide-formoterol Fumarate Itching, Other (See Comments)   Codeine Itching   Hydrocodone-acetaminophen Itching   Mushroom Extract Complex Nausea And Vomiting, Other (See Comments)   Only shitake mushroom  causes this reaction. Flu-like symptoms from portabello mushrooms      Medication List       Accurate as of April 17, 2019 11:09 AM. If you have any questions, ask your  nurse or doctor.        [name] 1 each by Does not apply route 5 (five) times daily. Use frequently, at least 5x a day.   amLODipine 5 MG tablet Commonly known as: NORVASC TAKE 1 TABLET DAILY   aspirin EC 81 MG tablet Take by mouth.   carvedilol 6.25 MG tablet Commonly known as: COREG TAKE 1 TABLET TWICE DAILY  WITH MEALS   cetirizine 10 MG tablet Commonly known as: EQ Allergy Relief (Cetirizine) Take 1 tablet (10 mg total) by mouth daily.   esomeprazole 40 MG capsule Commonly known as: NEXIUM TAKE 1 CAPSULE DAILY AT 12 NOON   famciclovir 500 MG tablet Commonly known as: FAMVIR Take 1 tablet (500 mg total) by mouth daily.   fluticasone 50 MCG/ACT nasal spray Commonly known as: FLONASE Place 2 sprays into both nostrils daily.   gabapentin 600 MG tablet Commonly known as: NEURONTIN TAKE 1 TABLET EVERY MORNINGAND 2 TABLETS EVERY NIGHT   hydrochlorothiazide 12.5 MG capsule Commonly known as: MICROZIDE TAKE 1 CAPSULE DAILY   isosorbide mononitrate 30 MG 24 hr tablet Commonly known as: IMDUR Take 1 tablet (30 mg total) by mouth 2 (two) times daily.   Klor-Con M20 20 MEQ tablet Generic drug: potassium chloride SA TAKE 1 TABLET  TWICE A DAY   levothyroxine 88 MCG tablet Commonly known as: SYNTHROID Take 1 tablet (88 mcg total) by mouth daily before breakfast.   LORazepam 1 MG tablet Commonly known as: ATIVAN Take 1 tablet (1 mg total) by mouth at bedtime.   nitroGLYCERIN 0.4 MG SL tablet Commonly known as: NITROSTAT Place 0.4 mg under the tongue every 5 (five) minutes as needed. For chest pain   oxyCODONE-acetaminophen 10-325 MG tablet Commonly known as: PERCOCET Take 1 tablet by mouth every 8 (eight) hours as needed.   pyridoxine 100 MG tablet Commonly known as: B-6 Take 100 mg by mouth daily.   RA Vitamin B-12 TR 1000 MCG Tbcr Generic drug: Cyanocobalamin Take 1 tablet by mouth daily.   rivaroxaban 10 MG Tabs tablet Commonly known as:  XARELTO Take 1 tablet (10 mg total) by mouth daily with supper.   tamsulosin 0.4 MG Caps capsule Commonly known as: FLOMAX Take 1 capsule by mouth once daily       Allergies:  Allergies  Allergen Reactions  . Budesonide-Formoterol Fumarate Itching and Other (See Comments)  . Codeine Itching  . Hydrocodone-Acetaminophen Itching  . Mushroom Extract Complex Nausea And Vomiting and Other (See Comments)    Only shitake mushroom  causes this reaction. Flu-like symptoms from portabello mushrooms    Past Medical History, Surgical history, Social history, and Family History were reviewed and updated.  Review of Systems: Review of Systems  Constitutional: Negative.   HENT: Negative.   Eyes: Negative.   Respiratory: Negative.   Cardiovascular: Negative.   Gastrointestinal: Negative.   Genitourinary: Negative.   Musculoskeletal: Negative.   Skin: Negative.   Neurological: Negative.   Endo/Heme/Allergies: Negative.   Psychiatric/Behavioral: Negative.    Marland Kitchen   Physical Exam:  weight is 221 lb (100.2 kg). His temporal temperature is 97.1 F (36.2 C) (abnormal). His blood pressure is 133/71 and his pulse is 58 (abnormal). His respiration is 20 and oxygen saturation is 96%.   Wt Readings from Last 3 Encounters:  04/17/19 221 lb (100.2 kg)  02/11/19 215 lb (97.5 kg)  01/17/19 210 lb (95.3 kg)    Physical Exam Vitals reviewed.  HENT:     Head: Normocephalic and atraumatic.  Eyes:     Pupils: Pupils are equal, round, and reactive to light.  Cardiovascular:     Rate and Rhythm: Normal rate and regular rhythm.     Heart sounds: Normal heart sounds.  Pulmonary:     Effort: Pulmonary effort is normal.     Breath sounds: Normal breath sounds.  Abdominal:     General: Bowel sounds are normal.     Palpations: Abdomen is soft.  Musculoskeletal:        General: No tenderness or deformity. Normal range of motion.     Cervical back: Normal range of motion.  Lymphadenopathy:      Cervical: No cervical adenopathy.  Skin:    General: Skin is warm and dry.     Findings: No erythema or rash.  Neurological:     Mental Status: He is alert and oriented to person, place, and time.  Psychiatric:        Behavior: Behavior normal.        Thought Content: Thought content normal.        Judgment: Judgment normal.      Lab Results  Component Value Date   WBC 6.6 04/17/2019   HGB 14.0 04/17/2019   HCT 40.5 04/17/2019   MCV 89.6 04/17/2019  PLT 181 04/17/2019   Lab Results  Component Value Date   FERRITIN 153 01/17/2019   IRON 81 01/17/2019   TIBC 313 01/17/2019   UIBC 232 01/17/2019   IRONPCTSAT 26 01/17/2019   Lab Results  Component Value Date   RETICCTPCT 1.3 02/02/2018   RBC 4.52 04/17/2019   Lab Results  Component Value Date   KPAFRELGTCHN 13.6 01/17/2019   LAMBDASER 20.4 01/17/2019   KAPLAMBRATIO 0.67 01/17/2019   Lab Results  Component Value Date   IGGSERUM 841 01/17/2019   IGA 54 (L) 01/17/2019   IGMSERUM 42 01/17/2019   Lab Results  Component Value Date   TOTALPROTELP 6.7 01/17/2019   ALBUMINELP 3.8 01/17/2019   A1GS 0.2 01/17/2019   A2GS 0.8 01/17/2019   BETS 1.0 01/17/2019   BETA2SER 0.3 02/11/2015   GAMS 0.8 01/17/2019   MSPIKE Not Observed 01/17/2019   SPEI Comment 01/17/2019     Chemistry      Component Value Date/Time   NA 141 04/17/2019 0952   NA 140 01/27/2017 1044   NA 141 06/05/2015 1036   K 3.2 (L) 04/17/2019 0952   K 3.3 01/27/2017 1044   K 2.9 (LL) 06/05/2015 1036   CL 103 04/17/2019 0952   CL 101 01/27/2017 1044   CO2 29 04/17/2019 0952   CO2 28 01/27/2017 1044   CO2 27 06/05/2015 1036   BUN 17 04/17/2019 0952   BUN 14 01/27/2017 1044   BUN 13.1 06/05/2015 1036   CREATININE 1.11 04/17/2019 0952   CREATININE 1.16 10/08/2018 1531   CREATININE 1.0 06/05/2015 1036   GLU 152 03/30/2016 0000      Component Value Date/Time   CALCIUM 9.3 04/17/2019 0952   CALCIUM 8.9 01/27/2017 1044   CALCIUM 9.4 06/05/2015  1036   ALKPHOS 77 04/17/2019 0952   ALKPHOS 126 (H) 01/27/2017 1044   ALKPHOS 94 06/05/2015 1036   AST 30 04/17/2019 0952   AST 22 06/05/2015 1036   ALT 38 04/17/2019 0952   ALT 33 01/27/2017 1044   ALT 18 06/05/2015 1036   BILITOT 0.7 04/17/2019 0952   BILITOT 0.59 06/05/2015 1036       Impression and Plan: [name] [name] is a very pleasant 79 yo caucasian gentleman with history of IgG kappa myeloma.  He underwent standard induction chemotherapy and then ultimately underwent stem cell transplant at [facility] in February 2017.   I see no evidence of disease recurrence.  I suppose this could always be a problem.  However, I would be surprised.  We will plan to get her back in 3 more months.  He will get his Zometa when he comes back to see [place].     [name], MD 3/3/202111:09 AM

## 2019-04-17 NOTE — Patient Instructions (Signed)
Zoledronic Acid injection (Hypercalcemia, Oncology) What is this medicine? ZOLEDRONIC ACID (ZOE le dron ik AS id) lowers the amount of calcium loss from bone. It is used to treat too much calcium in your blood from cancer. It is also used to prevent complications of cancer that has spread to the bone. This medicine may be used for other purposes; ask your health care provider or pharmacist if you have questions. COMMON BRAND NAME(S): Zometa What should I tell my health care provider before I take this medicine? They need to know if you have any of these conditions:  aspirin-sensitive asthma  cancer, especially if you are receiving medicines used to treat cancer  dental disease or wear dentures  infection  kidney disease  receiving corticosteroids like dexamethasone or prednisone  an unusual or allergic reaction to zoledronic acid, other medicines, foods, dyes, or preservatives  pregnant or trying to get pregnant  breast-feeding How should I use this medicine? This medicine is for infusion into a vein. It is given by a health care professional in a hospital or clinic setting. Talk to your pediatrician regarding the use of this medicine in children. Special care may be needed. Overdosage: If you think you have taken too much of this medicine contact a poison control center or emergency room at once. NOTE: This medicine is only for you. Do not share this medicine with others. What if I miss a dose? It is important not to miss your dose. Call your doctor or health care professional if you are unable to keep an appointment. What may interact with this medicine?  certain antibiotics given by injection  NSAIDs, medicines for pain and inflammation, like ibuprofen or naproxen  some diuretics like bumetanide, furosemide  teriparatide  thalidomide This list may not describe all possible interactions. Give your health care provider a list of all the medicines, herbs, non-prescription  drugs, or dietary supplements you use. Also tell them if you smoke, drink alcohol, or use illegal drugs. Some items may interact with your medicine. What should I watch for while using this medicine? Visit your doctor or health care professional for regular checkups. It may be some time before you see the benefit from this medicine. Do not stop taking your medicine unless your doctor tells you to. Your doctor may order blood tests or other tests to see how you are doing. Women should inform their doctor if they wish to become pregnant or think they might be pregnant. There is a potential for serious side effects to an unborn child. Talk to your health care professional or pharmacist for more information. You should make sure that you get enough calcium and vitamin D while you are taking this medicine. Discuss the foods you eat and the vitamins you take with your health care professional. Some people who take this medicine have severe bone, joint, and/or muscle pain. This medicine may also increase your risk for jaw problems or a broken thigh bone. Tell your doctor right away if you have severe pain in your jaw, bones, joints, or muscles. Tell your doctor if you have any pain that does not go away or that gets worse. Tell your dentist and dental surgeon that you are taking this medicine. You should not have major dental surgery while on this medicine. See your dentist to have a dental exam and fix any dental problems before starting this medicine. Take good care of your teeth while on this medicine. Make sure you see your dentist for regular follow-up   appointments. What side effects may I notice from receiving this medicine? Side effects that you should report to your doctor or health care professional as soon as possible:  allergic reactions like skin rash, itching or hives, swelling of the face, lips, or tongue  anxiety, confusion, or depression  breathing problems  changes in vision  eye  pain  feeling faint or lightheaded, falls  jaw pain, especially after dental work  mouth sores  muscle cramps, stiffness, or weakness  redness, blistering, peeling or loosening of the skin, including inside the mouth  trouble passing urine or change in the amount of urine Side effects that usually do not require medical attention (report to your doctor or health care professional if they continue or are bothersome):  bone, joint, or muscle pain  constipation  diarrhea  fever  hair loss  irritation at site where injected  loss of appetite  nausea, vomiting  stomach upset  trouble sleeping  trouble swallowing  weak or tired This list may not describe all possible side effects. Call your doctor for medical advice about side effects. You may report side effects to FDA at 1-800-FDA-1088. Where should I keep my medicine? This drug is given in a hospital or clinic and will not be stored at home. NOTE: This sheet is a summary. It may not cover all possible information. If you have questions about this medicine, talk to your doctor, pharmacist, or health care provider.  2020 Elsevier/Gold Standard (2013-06-29 14:19:39)  

## 2019-04-18 LAB — IGG, IGA, IGM
IgA: 55 mg/dL — ABNORMAL LOW (ref 61–437)
IgG (Immunoglobin G), Serum: 767 mg/dL (ref 603–1613)
IgM (Immunoglobulin M), Srm: 37 mg/dL (ref 15–143)

## 2019-04-18 LAB — KAPPA/LAMBDA LIGHT CHAINS
Kappa free light chain: 13.7 mg/L (ref 3.3–19.4)
Kappa, lambda light chain ratio: 1.22 (ref 0.26–1.65)
Lambda free light chains: 11.2 mg/L (ref 5.7–26.3)

## 2019-04-18 LAB — TSH: TSH: 4.564 u[IU]/mL — ABNORMAL HIGH (ref 0.320–4.118)

## 2019-04-19 LAB — PROTEIN ELECTROPHORESIS, SERUM, WITH REFLEX
A/G Ratio: 1.6 (ref 0.7–1.7)
Albumin ELP: 3.8 g/dL (ref 2.9–4.4)
Alpha-1-Globulin: 0.2 g/dL (ref 0.0–0.4)
Alpha-2-Globulin: 0.7 g/dL (ref 0.4–1.0)
Beta Globulin: 0.9 g/dL (ref 0.7–1.3)
Gamma Globulin: 0.7 g/dL (ref 0.4–1.8)
Globulin, Total: 2.4 g/dL (ref 2.2–3.9)
Total Protein ELP: 6.2 g/dL (ref 6.0–8.5)

## 2019-04-20 ENCOUNTER — Encounter: Payer: Self-pay | Admitting: Hematology & Oncology

## 2019-04-22 ENCOUNTER — Other Ambulatory Visit: Payer: Self-pay | Admitting: *Deleted

## 2019-04-22 MED ORDER — LEVOTHYROXINE SODIUM 100 MCG PO TABS: 100.0000 ug | ORAL_TABLET | Freq: Every day | ORAL | 0 refills | Status: DC

## 2019-04-26 ENCOUNTER — Other Ambulatory Visit: Payer: Self-pay | Admitting: *Deleted

## 2019-04-26 MED ORDER — LEVOTHYROXINE SODIUM 100 MCG PO TABS: 100.0000 ug | ORAL_TABLET | Freq: Every day | ORAL | 3 refills | Status: DC

## 2019-04-30 ENCOUNTER — Ambulatory Visit (INDEPENDENT_AMBULATORY_CARE_PROVIDER_SITE_OTHER): Payer: Medicare Other | Admitting: Sports Medicine

## 2019-04-30 DIAGNOSIS — N644 Mastodynia: Secondary | ICD-10-CM | POA: Diagnosis not present

## 2019-04-30 NOTE — Progress Notes (Signed)
    Procedures performed today:    None.  Independent interpretation of notes and tests performed by another provider:   None.  Impression and Recommendations:    Pain of left breast Pleasant 79 year old male, he just had his Covid vaccine #2 into the left deltoid.  Subsequently he developed swelling and pain in his left breast, minimally into the left axilla. Exam is for the most part unremarkable with a visibly swollen left breast, tender breast tissue on the left, but no palpable lymphadenopathy. No nipple discharge, no peau d'orange, no systemic symptoms. I think this is likely reactive changes from his COVID-19 vaccine, I would like to see him back in a month and if still with significant symptoms we will consider breast imaging.    ___________________________________________ [name] [name]. [name] [name], M.D., ABFM., CAQSM. Primary Care and [place] Instructor of [place] of [facility] of Medicine

## 2019-04-30 NOTE — Assessment & Plan Note (Signed)
Pleasant 79 year old male, he just had his Covid vaccine #2 into the left deltoid.  Subsequently he developed swelling and pain in his left breast, minimally into the left axilla. Exam is for the most part unremarkable with a visibly swollen left breast, tender breast tissue on the left, but no palpable lymphadenopathy. No nipple discharge, no peau d'orange, no systemic symptoms. I think this is likely reactive changes from his COVID-19 vaccine, I would like to see him back in a month and if still with significant symptoms we will consider breast imaging.

## 2019-05-05 ENCOUNTER — Other Ambulatory Visit: Payer: Self-pay | Admitting: Sports Medicine

## 2019-05-20 DIAGNOSIS — L814 Other melanin hyperpigmentation: Secondary | ICD-10-CM | POA: Diagnosis not present

## 2019-05-20 DIAGNOSIS — L820 Inflamed seborrheic keratosis: Secondary | ICD-10-CM | POA: Diagnosis not present

## 2019-05-20 DIAGNOSIS — D1801 Hemangioma of skin and subcutaneous tissue: Secondary | ICD-10-CM | POA: Diagnosis not present

## 2019-05-20 DIAGNOSIS — L579 Skin changes due to chronic exposure to nonionizing radiation, unspecified: Secondary | ICD-10-CM | POA: Diagnosis not present

## 2019-05-20 DIAGNOSIS — D225 Melanocytic nevi of trunk: Secondary | ICD-10-CM | POA: Diagnosis not present

## 2019-05-20 DIAGNOSIS — L570 Actinic keratosis: Secondary | ICD-10-CM | POA: Diagnosis not present

## 2019-05-20 DIAGNOSIS — Z85820 Personal history of malignant melanoma of skin: Secondary | ICD-10-CM | POA: Diagnosis not present

## 2019-05-23 ENCOUNTER — Ambulatory Visit (INDEPENDENT_AMBULATORY_CARE_PROVIDER_SITE_OTHER): Payer: Medicare Other | Admitting: Emergency Medicine

## 2019-05-23 ENCOUNTER — Encounter: Payer: Self-pay | Admitting: Emergency Medicine

## 2019-05-23 ENCOUNTER — Other Ambulatory Visit: Payer: Self-pay

## 2019-05-23 DIAGNOSIS — I2699 Other pulmonary embolism without acute cor pulmonale: Secondary | ICD-10-CM | POA: Diagnosis not present

## 2019-05-23 DIAGNOSIS — J479 Bronchiectasis, uncomplicated: Secondary | ICD-10-CM | POA: Diagnosis not present

## 2019-05-23 DIAGNOSIS — J449 Chronic obstructive pulmonary disease, unspecified: Secondary | ICD-10-CM

## 2019-05-23 MED ORDER — CETIRIZINE HCL 10 MG PO TABS: 10.0000 mg | ORAL_TABLET | Freq: Every day | ORAL | 3 refills | Status: DC

## 2019-05-23 NOTE — Assessment & Plan Note (Signed)
He has a stable mucous management routine.  He is probably under using his flutter valve.  He could also use albuterol as needed for secretion clearance.  He is treating rhinitis effectively with Zyrtec and fluticasone, uses Mucinex as needed.  He coughs about twice a day.  He knows the signs of an exacerbation of bronchiectasis and knows to call us if these develop.  No clear indication to repeat a CT scan of the chest at this time.  We will discuss timing depending on any clinical progression.

## 2019-05-23 NOTE — Progress Notes (Addendum)
Subjective:    Patient ID: [name] [name], male    DOB: [birth year], 79 y.o.   MRN: [id number]  HPI 79 year old gentleman, former smoker (pack years) with a history of multiple myeloma and bone marrow transplant at [facility] 03/2015, pulmonary embolism and DVT on lifelong anticoagulation, obstructive sleep apnea on CPAP.  He has been followed for COPD, no spirometry available in our system.  Also with pulmonary nodules followed by CT chest.  Most recent was 09/18/2017 which I have reviewed and which shows some right middle lobe and lingular, left lower lobe bronchiectatic change, stable LLL scar / inflammation, small left pleural effusion and a stable left lower lobe 6 mm nodule.   He was treated with Revlimid x 2 years, just stopped it. Is on famvir.   He has been producing more colored mucous for about 3 months, was treated with abx x 2 by Dr Katheran Awe and prompted the CT chest as above. The mucous thinned out some w abx, then he caught URI about 2 weeks ago and has had some hoarse voice, increased mucous with color.  He has chronic sinus drainage, GERD. Has breakthrough PND, no breakthrough GERD.   Currently on Singulair, has albuterol to use as needed. CPAP compliance is excellent, he has significant clinical benefit. More energy, less daytime SOB.   Needs repeat CT chest 1 year (at least) for nodule, maybe sooner for BTX.   ROV 12/11/17 --Mr. [name] follows up today for his history of DVT/PE, COPD, mild bronchiectasis noted on CT scan of the chest, chronic cough, OSA on CPAP.Marland Kitchen  He also has a history of pulmonary nodules, notably a stable 6 mm left lower lobe nodule that is likely benign. He reports today that his chest congestion is better. He is having some eye tearing, occasional sneeze. He was on singulair, started zyrtec last time and is using flonase prn. He tried stopping singulair and has done OK. He is on nexium qd without any breakthrough.  CPAP compliance and clinical benefit is excellent.    ROV 09/05/2018 --[name] is 75, has a history of multiple myeloma with bone marrow transplant in 2017, DVT/PE on lifelong anticoagulation, OSA on CPAP, COPD with mild bronchiectatic change on CT scan of the chest and some associated chronic cough.  We have also followed him for pulmonary nodules including a stable 6 mm left lower lobe nodule is likely benign, no follow-up planned.  He experienced a flare of his bronchiectasis in April for which he was treated with prednisone and doxycycline. In retrospect he is having continued sx, coughing up some thick green mucous when he lays down at night, also in the am when he gets up, especially after his shower. He uses mucinex prn - has sometimes gotten relief from this, less cough, more clear. He started a flutter valve about 1 month ago.   On zyrtec, Otherwise he is been managed on albuterol as needed, never needs it.  He underwent pulmonary function testing today which I reviewed.  This shows moderate obstruction without a bronchodilator response, hyperinflated volumes and a normal diffusion capacity.  He has a CPAP compliance download over the last 30 days, he has 97% compliance for greater than 4 hours a night.  He is set on 10 cm water.  ROV 05/23/19 --79 year old man with a history of COPD and mild bronchiectasis by CT chest, OSA on CPAP 10 cmH2O, chronic cough with allergic rhinitis.  He also has a history of multiple myeloma with bone marrow transplant  2017, DVT/PE on long-term anticoagulation.  He uses a flutter valve occasionally.  He has albuterol which he uses rarely.  Zyrtec, fluticasone nasal spray, mucinex prn. He uses herbal tea, honey, pineapple juice as well.  Wears CPAP reliably, no problems with it. He has great compliance - 6.5h a night. He breathes better, has less daytime sleepiness.   Addendum 03/03/20 >> as documented above, patient has less daytime sleepiness, better daytime breathing when he ius compliant with his CPAP. This  confirms that he gets clinical benefit from wearing the device. This note was entered to satisfy criteria for DME and insurance.     Review of Systems As per HPI     Objective:   Physical Exam  Vitals:   05/23/19 1114  BP: 140/80  Pulse: 62  SpO2: 96%  Weight: 214 lb (97.1 kg)  Height: 6' (1.829 m)   Gen: Pleasant, well-nourished, in no distress,  normal affect  ENT: No lesions,  mouth clear,  oropharynx clear, no postnasal drip  Neck: No JVD, no stridor  Lungs: No use of accessory muscles, coarse at B bases,  no wheeze  Cardiovascular: RRR, heart sounds normal, no murmur or gallops, no peripheral edema  Musculoskeletal: No deformities, no cyanosis or clubbing  Neuro: alert, non focal  Skin: Warm, no lesions or rash     Assessment & Plan:  Pulmonary embolism (HCC) On chronic anticoagulation  Asthma with COPD Mild with only intermittent symptoms.  He has albuterol available to use as needed, rarely uses.  Bronchiectasis without complication (HCC) He has a stable mucous management routine.  He is probably under using his flutter valve.  He could also use albuterol as needed for secretion clearance.  He is treating rhinitis effectively with Zyrtec and fluticasone, uses Mucinex as needed.  He coughs about twice a day.  He knows the signs of an exacerbation of bronchiectasis and knows to call us if these develop.  No clear indication to repeat a CT scan of the chest at this time.  We will discuss timing depending on any clinical progression.  Robert [name], MD, PhD 05/23/2019, 11:35 AM [facility] [phone] or if no answer [phone]  

## 2019-05-23 NOTE — Patient Instructions (Addendum)
Please continue your CPAP every night as you have been using it. Continue cetirizine once daily as you have been taking it. Continue fluticasone nasal spray, 2 sprays each nostril once daily. Use guaifenesin (Mucinex) as needed to assist with mucus clearance Use your flutter valve as needed to help with mucus clearance You might want to consider using your albuterol 2 puffs to help with mucus clearance.  Uses also if you have any episodes of shortness of breath. We will not repeat a CT scan of your chest at this time.  We will decide when to do so based on how your cough, mucus, clinical symptoms progress. Please call our office if you develop any change in your cough, breathing, mucus production or color. Follow with Dr. Lamonte Sakai in 12 months or sooner if you have any problems.

## 2019-05-23 NOTE — Assessment & Plan Note (Signed)
On chronic anticoagulation. 

## 2019-05-23 NOTE — Assessment & Plan Note (Signed)
Mild with only intermittent symptoms.  He has albuterol available to use as needed, rarely uses.

## 2019-05-28 ENCOUNTER — Other Ambulatory Visit: Payer: Self-pay

## 2019-05-28 ENCOUNTER — Encounter: Payer: Self-pay | Admitting: Sports Medicine

## 2019-05-28 ENCOUNTER — Ambulatory Visit (INDEPENDENT_AMBULATORY_CARE_PROVIDER_SITE_OTHER): Payer: Medicare Other | Admitting: Sports Medicine

## 2019-05-28 DIAGNOSIS — N644 Mastodynia: Secondary | ICD-10-CM | POA: Diagnosis not present

## 2019-05-28 DIAGNOSIS — M19012 Primary osteoarthritis, left shoulder: Secondary | ICD-10-CM | POA: Diagnosis not present

## 2019-05-28 NOTE — Progress Notes (Signed)
    Procedures performed today:    Procedure: Real-time Ultrasound Guided injection of the left glenohumeral joint Device: Samsung HS60  Verbal informed consent obtained.  Time-out conducted.  Noted no overlying erythema, induration, or other signs of local infection.  Skin prepped in a sterile fashion.  Local anesthesia: Topical Ethyl chloride.  With sterile technique and under real time ultrasound guidance: 1 cc Kenalog 40, 2 cc lidocaine, 2 cc bupivacaine injected easily Completed without difficulty  Pain immediately resolved suggesting accurate placement of the medication.  Advised to call if fevers/chills, erythema, induration, drainage, or persistent bleeding.  Images permanently stored and available for review in the ultrasound unit.  Impression: Technically successful ultrasound guided injection.  Independent interpretation of notes and tests performed by another provider:   None.  Brief History, Exam, Impression, and Recommendations:    Pain of left breast This pleasant 79 year old male returns, he was seen approximately a month ago, he had recently had his COVID-19 vaccine #2 into the left deltoid, he then developed swelling and pain in his left breast and minimally into the left axilla. Exam was unremarkable with the exception of a visibly swollen left breast with tender breast tissue but no palpable adenopathy, no nipple discharge, no peau d'orange, no systemic symptoms. He returns today with persistent breast enlargement, we are going to proceed with mammogram and ultrasound and my suspicion is we will be proceeding to breast MRI.  Primary osteoarthritis of left shoulder [name] [name] returns, he is well past his COVID-19 vaccine #2. He has recurrence of left shoulder pain, back in December I did a glenohumeral injection, he responded well. Repeating left glenohumeral injection today.    ___________________________________________ [name] [name], M.D., ABFM.,  CAQSM. Primary Care and [place] Instructor of [place] of [facility] of Medicine

## 2019-05-28 NOTE — Assessment & Plan Note (Signed)
This pleasant 79 year old male returns, he was seen approximately a month ago, he had recently had his COVID-19 vaccine #2 into the left deltoid, he then developed swelling and pain in his left breast and minimally into the left axilla. Exam was unremarkable with the exception of a visibly swollen left breast with tender breast tissue but no palpable adenopathy, no nipple discharge, no peau d'orange, no systemic symptoms. He returns today with persistent breast enlargement, we are going to proceed with mammogram and ultrasound and my suspicion is we will be proceeding to breast MRI.

## 2019-05-28 NOTE — Assessment & Plan Note (Signed)
[name] [name] returns, he is well past his COVID-19 vaccine #2. He has recurrence of left shoulder pain, back in December I did a glenohumeral injection, he responded well. Repeating left glenohumeral injection today.

## 2019-06-03 ENCOUNTER — Other Ambulatory Visit: Payer: Self-pay | Admitting: Sports Medicine

## 2019-06-03 ENCOUNTER — Other Ambulatory Visit: Payer: Self-pay | Admitting: Hematology & Oncology

## 2019-06-11 ENCOUNTER — Other Ambulatory Visit: Payer: Self-pay

## 2019-06-11 ENCOUNTER — Other Ambulatory Visit: Payer: Self-pay | Admitting: Sports Medicine

## 2019-06-11 ENCOUNTER — Ambulatory Visit
Admission: RE | Admit: 2019-06-11 | Discharge: 2019-06-11 | Disposition: A | Discharge status: Home / Self Care | Payer: Medicare Other | Source: Ambulatory Visit | Attending: Sports Medicine | Admitting: Sports Medicine

## 2019-06-11 ENCOUNTER — Ambulatory Visit: Payer: Medicare Other

## 2019-06-11 DIAGNOSIS — N644 Mastodynia: Secondary | ICD-10-CM

## 2019-06-11 DIAGNOSIS — R928 Other abnormal and inconclusive findings on diagnostic imaging of breast: Secondary | ICD-10-CM | POA: Diagnosis not present

## 2019-06-11 DIAGNOSIS — R599 Enlarged lymph nodes, unspecified: Secondary | ICD-10-CM

## 2019-06-11 DIAGNOSIS — N62 Hypertrophy of breast: Secondary | ICD-10-CM | POA: Diagnosis not present

## 2019-06-13 ENCOUNTER — Ambulatory Visit: Payer: Medicare Other | Admitting: Sports Medicine

## 2019-06-25 ENCOUNTER — Ambulatory Visit: Payer: Medicare Other | Admitting: Sports Medicine

## 2019-06-27 ENCOUNTER — Telehealth: Payer: Self-pay | Admitting: Sports Medicine

## 2019-06-27 NOTE — Telephone Encounter (Signed)
Mr Alberta requested an appointment anytime tomorrow.  "Breast pain,swollen left chest wall pain"  Are we able to double book? Or move to monday

## 2019-06-28 ENCOUNTER — Other Ambulatory Visit: Payer: Self-pay

## 2019-06-28 ENCOUNTER — Ambulatory Visit (INDEPENDENT_AMBULATORY_CARE_PROVIDER_SITE_OTHER): Payer: Medicare Other | Admitting: Sports Medicine

## 2019-06-28 ENCOUNTER — Encounter: Payer: Self-pay | Admitting: Sports Medicine

## 2019-06-28 DIAGNOSIS — N644 Mastodynia: Secondary | ICD-10-CM | POA: Diagnosis not present

## 2019-06-28 DIAGNOSIS — M47815 Spondylosis without myelopathy or radiculopathy, thoracolumbar region: Secondary | ICD-10-CM

## 2019-06-28 MED ORDER — OXYCODONE-ACETAMINOPHEN 10-325 MG PO TABS: 1.0000 | ORAL_TABLET | Freq: Three times a day (TID) | ORAL | 0 refills | Status: DC | PRN

## 2019-06-28 NOTE — Telephone Encounter (Signed)
Scheduled. Thank you.

## 2019-06-28 NOTE — Progress Notes (Addendum)
    Procedures performed today:    None.  Independent interpretation of notes and tests performed by another provider:   None.  Brief History, Exam, Impression, and Recommendations:    Pain of left breast This pleasant 79 year old male who is status post bone marrow transplant for multiple myeloma had his COVID-19 vaccine approximately a month ago, he then developed swelling and pain in his left breast and into the left axilla. We obtained mammogram and ultrasound that showed benign-appearing gynecomastia and a 4 mm lymph node thought to be reactive. Unfortunately he has worsening swelling, worsening breast pain, worsening axillary swelling. We are going to proceed with breast MRI with and without contrast, adding a BMP. Certainly with his history if this is unrevealing we will consider a CT of the chest and even referral to breast surgery.  Breast MRI shows benign gynecomastia, previous axillary lymphadenopathy has improved considerably, he will need a 54-monthfollow-up left axillary ultrasound per radiology, I'm going to order this and he can schedule it for 3 months out.    ___________________________________________ TGwen Her TDianah Field M.D., ABFM., CAQSM. Primary Care and [place] of [facility]

## 2019-06-28 NOTE — Assessment & Plan Note (Addendum)
This pleasant 79 year old male who is status post bone marrow transplant for multiple myeloma had his COVID-19 vaccine approximately a month ago, he then developed swelling and pain in his left breast and into the left axilla. We obtained mammogram and ultrasound that showed benign-appearing gynecomastia and a 4 mm lymph node thought to be reactive. Unfortunately he has worsening swelling, worsening breast pain, worsening axillary swelling. We are going to proceed with breast MRI with and without contrast, adding a BMP. Certainly with his history if this is unrevealing we will consider a CT of the chest and even referral to breast surgery.  Breast MRI shows benign gynecomastia, previous axillary lymphadenopathy has improved considerably, he will need a 58-monthfollow-up left axillary ultrasound per radiology, I'm going to order this and he can schedule it for 3 months out.

## 2019-06-28 NOTE — Telephone Encounter (Signed)
Lets do 1:15.

## 2019-06-29 LAB — BASIC METABOLIC PANEL
BUN/Creatinine Ratio: 13 (ref 10–24)
BUN: 15 mg/dL (ref 8–27)
CO2: 24 mmol/L (ref 20–29)
Calcium: 9.3 mg/dL (ref 8.6–10.2)
Chloride: 101 mmol/L (ref 96–106)
Creatinine, Ser: 1.16 mg/dL (ref 0.76–1.27)
GFR calc Af Amer: 69 mL/min/{1.73_m2} (ref >59)
GFR calc non Af Amer: 60 mL/min/{1.73_m2} (ref >59)
Glucose: 89 mg/dL (ref 65–99)
Potassium: 3.4 mmol/L — ABNORMAL LOW (ref 3.5–5.2)
Sodium: 145 mmol/L — ABNORMAL HIGH (ref 134–144)

## 2019-07-17 ENCOUNTER — Other Ambulatory Visit: Payer: Self-pay | Admitting: Sports Medicine

## 2019-07-17 DIAGNOSIS — N401 Enlarged prostate with lower urinary tract symptoms: Secondary | ICD-10-CM

## 2019-07-17 DIAGNOSIS — N138 Other obstructive and reflux uropathy: Secondary | ICD-10-CM

## 2019-07-18 ENCOUNTER — Inpatient Hospital Stay: Payer: Medicare Other

## 2019-07-18 ENCOUNTER — Encounter: Payer: Self-pay | Admitting: Hematology & Oncology

## 2019-07-18 ENCOUNTER — Telehealth: Payer: Self-pay | Admitting: Hematology & Oncology

## 2019-07-18 ENCOUNTER — Other Ambulatory Visit: Payer: Self-pay

## 2019-07-18 ENCOUNTER — Inpatient Hospital Stay: Payer: Medicare Other | Attending: Hematology & Oncology | Admitting: Hematology & Oncology

## 2019-07-18 VITALS — BP 134/73 | HR 66 | Temp 96.8°F | Resp 16 | Wt 212.0 lb

## 2019-07-18 DIAGNOSIS — E034 Atrophy of thyroid (acquired): Secondary | ICD-10-CM

## 2019-07-18 DIAGNOSIS — C9000 Multiple myeloma not having achieved remission: Secondary | ICD-10-CM

## 2019-07-18 DIAGNOSIS — E039 Hypothyroidism, unspecified: Secondary | ICD-10-CM | POA: Diagnosis not present

## 2019-07-18 DIAGNOSIS — C9001 Multiple myeloma in remission: Secondary | ICD-10-CM

## 2019-07-18 DIAGNOSIS — D500 Iron deficiency anemia secondary to blood loss (chronic): Secondary | ICD-10-CM

## 2019-07-18 DIAGNOSIS — M899 Disorder of bone, unspecified: Secondary | ICD-10-CM

## 2019-07-18 LAB — CMP (CANCER CENTER ONLY)
ALT: 30 U/L (ref 0–44)
AST: 22 U/L (ref 15–41)
Albumin: 4.1 g/dL (ref 3.5–5.0)
Alkaline Phosphatase: 80 U/L (ref 38–126)
Anion gap: 9 (ref 5–15)
BUN: 14 mg/dL (ref 8–23)
CO2: 27 mmol/L (ref 22–32)
Calcium: 9.3 mg/dL (ref 8.9–10.3)
Chloride: 104 mmol/L (ref 98–111)
Creatinine: 1.15 mg/dL (ref 0.61–1.24)
GFR, Est AFR Am: >60 mL/min
GFR, Estimated: >60 mL/min
Glucose, Bld: 165 mg/dL — ABNORMAL HIGH (ref 70–99)
Potassium: 3.1 mmol/L — ABNORMAL LOW (ref 3.5–5.1)
Sodium: 140 mmol/L (ref 135–145)
Total Bilirubin: 0.7 mg/dL (ref 0.3–1.2)
Total Protein: 6.4 g/dL — ABNORMAL LOW (ref 6.5–8.1)

## 2019-07-18 LAB — CBC WITH DIFFERENTIAL (CANCER CENTER ONLY)
Abs Immature Granulocytes: 0.01 10*3/uL (ref 0.00–0.07)
Basophils Absolute: 0 10*3/uL (ref 0.0–0.1)
Basophils Relative: 1 %
Eosinophils Absolute: 0.2 10*3/uL (ref 0.0–0.5)
Eosinophils Relative: 3 %
HCT: 40.9 % (ref 39.0–52.0)
Hemoglobin: 13.9 g/dL (ref 13.0–17.0)
Immature Granulocytes: 0 %
Lymphocytes Relative: 31 %
Lymphs Abs: 1.7 10*3/uL (ref 0.7–4.0)
MCH: 30.6 pg (ref 26.0–34.0)
MCHC: 34 g/dL (ref 30.0–36.0)
MCV: 90.1 fL (ref 80.0–100.0)
Monocytes Absolute: 0.5 10*3/uL (ref 0.1–1.0)
Monocytes Relative: 9 %
Neutro Abs: 3.1 10*3/uL (ref 1.7–7.7)
Neutrophils Relative %: 56 %
Platelet Count: 179 10*3/uL (ref 150–400)
RBC: 4.54 MIL/uL (ref 4.22–5.81)
RDW: 13.4 % (ref 11.5–15.5)
WBC Count: 5.5 10*3/uL (ref 4.0–10.5)
nRBC: 0 % (ref 0.0–0.2)

## 2019-07-18 LAB — RETICULOCYTES
Immature Retic Fract: 13.6 % (ref 2.3–15.9)
RBC.: 4.53 MIL/uL (ref 4.22–5.81)
Retic Count, Absolute: 77 10*3/uL (ref 19.0–186.0)
Retic Ct Pct: 1.7 % (ref 0.4–3.1)

## 2019-07-18 LAB — FERRITIN: Ferritin: 199 ng/mL (ref 24–336)

## 2019-07-18 LAB — TSH: TSH: 1.236 u[IU]/mL (ref 0.320–4.118)

## 2019-07-18 LAB — IRON AND TIBC
Iron: 65 ug/dL (ref 42–163)
Saturation Ratios: 21 % (ref 20–55)
TIBC: 316 ug/dL (ref 202–409)
UIBC: 251 ug/dL (ref 117–376)

## 2019-07-18 MED ORDER — SODIUM CHLORIDE 0.9 % IV SOLN
INTRAVENOUS | Status: DC
  Filled 2019-07-18: qty 250

## 2019-07-18 MED ORDER — ZOLEDRONIC ACID 4 MG/100ML IV SOLN
4.0000 mg | Freq: Once | INTRAVENOUS | Status: AC
  Administered 2019-07-18: 4 mg via INTRAVENOUS
  Filled 2019-07-18: qty 100

## 2019-07-18 NOTE — Telephone Encounter (Signed)
Appointments scheduled patient will get updates from My Chart 6/3 los

## 2019-07-18 NOTE — Progress Notes (Signed)
Hematology and Oncology Follow Up Visit  [name] [name] [id number] Nov 19, 1940 80 y.o. 07/18/2019   Principle Diagnosis:  IgG kappa myeloma - Trisomy 11 by FISH Acute thromboembolism of the right lower leg  Past Therapy:        S/P ASCT at [place] on 04/02/2015 Patient s/p cycle 5 of Velcade/Revlimid/Decadron Revlimid 10mg  po q day (21/28) -- d/c on 09/15/2017  Current Therapy:   Zometa 4 mg IV every3 months  - next dose due 10/2019 Xarelto 10 mg by mouth daily   Interim History:  [name] [name] is here today for follow-up and Zometa.  The problem now is that he has had swelling about the left breast tissue.  He said this happened after he had his vaccine for the coronavirus in January.  He said this is not gotten better.  He actually had a mammogram done.  This was done on 06/11/2019.  This showed left breast gynecomastia.  There was a left axillary lymph node that appeared to be reactive.  He was to have a MRI but this will not be done for another 3 weeks.  I think that a CT scan should be able to give [place] the information that we need.  Otherwise, he is doing well.  There is no active myeloma.  His monoclonal spike back in March was not observed.  His IgG level was 760 mg/dL.  The kappa light chain was 1.4 mg/dL.  He had a good Memorial Day weekend.  Some friends came over.  They ruled out.  His feet are causing a little bit of issue.  Sounds like he has a little neuropathy in his feet.  He has had no fever.  He has had no nausea or vomiting.  There is been no change in bowel or bladder habits.  He has had no rashes.  His back seems to be doing a little bit better.  Overall, his performance status is ECOG 1.    Medications:  Allergies as of 07/18/2019      Reactions   Budesonide-formoterol Fumarate Itching, Other (See Comments)   Codeine Itching   Hydrocodone-acetaminophen Itching   Mushroom Extract Complex Nausea And Vomiting, Other (See Comments)   Only shitake mushroom   causes this reaction. Flu-like symptoms from portabello mushrooms      Medication List       Accurate as of July 18, 2019 10:31 AM. If you have any questions, ask your nurse or doctor.        amLODipine 5 MG tablet Commonly known as: NORVASC TAKE 1 TABLET DAILY   aspirin EC 81 MG tablet Take by mouth.   carvedilol 6.25 MG tablet Commonly known as: COREG TAKE 1 TABLET TWICE DAILY  WITH MEALS   cetirizine 10 MG tablet Commonly known as: EQ Allergy Relief (Cetirizine) Take 1 tablet (10 mg total) by mouth daily.   esomeprazole 40 MG capsule Commonly known as: NEXIUM TAKE 1 CAPSULE DAILY AT 12 NOON   famciclovir 500 MG tablet Commonly known as: FAMVIR TAKE 1 TABLET DAILY   fluticasone 50 MCG/ACT nasal spray Commonly known as: FLONASE Place 2 sprays into both nostrils daily.   gabapentin 600 MG tablet Commonly known as: NEURONTIN TAKE 1 TABLET EVERY MORNINGAND 2 TABLETS EVERY NIGHT   hydrochlorothiazide 12.5 MG capsule Commonly known as: MICROZIDE TAKE 1 CAPSULE DAILY   isosorbide mononitrate 30 MG 24 hr tablet Commonly known as: IMDUR Take 1 tablet (30 mg total) by mouth 2 (two) times daily.   Klor-Con  M20 20 MEQ tablet Generic drug: potassium chloride SA TAKE 1 TABLET TWICE A DAY   levothyroxine 100 MCG tablet Commonly known as: Synthroid Take 1 tablet (100 mcg total) by mouth daily before breakfast.   LORazepam 1 MG tablet Commonly known as: ATIVAN Take 1 tablet (1 mg total) by mouth at bedtime.   nitroGLYCERIN 0.4 MG SL tablet Commonly known as: NITROSTAT Place 0.4 mg under the tongue every 5 (five) minutes as needed. For chest pain   oxyCODONE-acetaminophen 10-325 MG tablet Commonly known as: PERCOCET Take 1 tablet by mouth every 8 (eight) hours as needed.   pyridoxine 100 MG tablet Commonly known as: B-6 Take 100 mg by mouth daily.   RA Vitamin B-12 TR 1000 MCG Tbcr Generic drug: Cyanocobalamin Take 1 tablet by mouth daily.   rivaroxaban 10  MG Tabs tablet Commonly known as: XARELTO Take 1 tablet (10 mg total) by mouth daily with supper.   tamsulosin 0.4 MG Caps capsule Commonly known as: FLOMAX TAKE 1 CAPSULE DAILY AFTER BREAKFAST       Allergies:  Allergies  Allergen Reactions  . Budesonide-Formoterol Fumarate Itching and Other (See Comments)  . Codeine Itching  . Hydrocodone-Acetaminophen Itching  . Mushroom Extract Complex Nausea And Vomiting and Other (See Comments)    Only shitake mushroom  causes this reaction. Flu-like symptoms from portabello mushrooms    Past Medical History, Surgical history, Social history, and Family History were reviewed and updated.  Review of Systems: Review of Systems  Constitutional: Negative.   HENT: Negative.   Eyes: Negative.   Respiratory: Negative.   Cardiovascular: Negative.   Gastrointestinal: Negative.   Genitourinary: Negative.   Musculoskeletal: Negative.   Skin: Negative.   Neurological: Negative.   Endo/Heme/Allergies: Negative.   Psychiatric/Behavioral: Negative.    Marland Kitchen   Physical Exam:  weight is 212 lb (96.2 kg). His temporal temperature is 96.8 F (36 C) (abnormal). His blood pressure is 134/73 and his pulse is 66. His respiration is 16 and oxygen saturation is 97%.   Wt Readings from Last 3 Encounters:  07/18/19 212 lb (96.2 kg)  06/28/19 212 lb (96.2 kg)  05/28/19 215 lb (97.5 kg)    Physical Exam Vitals reviewed.  Constitutional:      Comments: Chest wall exam shows the right breast area to be without swelling.  There is no tenderness.  There is no right axillary adenopathy.  Left breast area is somewhat swollen.  There is a little bit of firmness about the areola.  There is palpable lymph node in the left axilla.  It probably measures may be a centimeter.  It is mobile and nontender.  HENT:     Head: Normocephalic and atraumatic.  Eyes:     Pupils: Pupils are equal, round, and reactive to light.  Cardiovascular:     Rate and Rhythm: Normal  rate and regular rhythm.     Heart sounds: Normal heart sounds.  Pulmonary:     Effort: Pulmonary effort is normal.     Breath sounds: Normal breath sounds.  Abdominal:     General: Bowel sounds are normal.     Palpations: Abdomen is soft.  Musculoskeletal:        General: No tenderness or deformity. Normal range of motion.     Cervical back: Normal range of motion.  Lymphadenopathy:     Cervical: No cervical adenopathy.  Skin:    General: Skin is warm and dry.     Findings: No erythema or rash.  Neurological:     Mental Status: He is alert and oriented to person, place, and time.  Psychiatric:        Behavior: Behavior normal.        Thought Content: Thought content normal.        Judgment: Judgment normal.      Lab Results  Component Value Date   WBC 5.5 07/18/2019   HGB 13.9 07/18/2019   HCT 40.9 07/18/2019   MCV 90.1 07/18/2019   PLT 179 07/18/2019   Lab Results  Component Value Date   FERRITIN 153 01/17/2019   IRON 81 01/17/2019   TIBC 313 01/17/2019   UIBC 232 01/17/2019   IRONPCTSAT 26 01/17/2019   Lab Results  Component Value Date   RETICCTPCT 1.7 07/18/2019   RBC 4.54 07/18/2019   Lab Results  Component Value Date   KPAFRELGTCHN 13.7 04/17/2019   LAMBDASER 11.2 04/17/2019   KAPLAMBRATIO 1.22 04/17/2019   Lab Results  Component Value Date   IGGSERUM 767 04/17/2019   IGA 55 (L) 04/17/2019   IGMSERUM 37 04/17/2019   Lab Results  Component Value Date   TOTALPROTELP 6.2 04/17/2019   ALBUMINELP 3.8 04/17/2019   A1GS 0.2 04/17/2019   A2GS 0.7 04/17/2019   BETS 0.9 04/17/2019   BETA2SER 0.3 02/11/2015   GAMS 0.7 04/17/2019   MSPIKE Not Observed 04/17/2019   SPEI Comment 01/17/2019     Chemistry      Component Value Date/Time   NA 140 07/18/2019 0934   NA 145 (H) 06/28/2019 1501   NA 140 01/27/2017 1044   NA 141 06/05/2015 1036   K 3.1 (L) 07/18/2019 0934   K 3.3 01/27/2017 1044   K 2.9 (LL) 06/05/2015 1036   CL 104 07/18/2019 0934    CL 101 01/27/2017 1044   CO2 27 07/18/2019 0934   CO2 28 01/27/2017 1044   CO2 27 06/05/2015 1036   BUN 14 07/18/2019 0934   BUN 15 06/28/2019 1501   BUN 14 01/27/2017 1044   BUN 13.1 06/05/2015 1036   CREATININE 1.15 07/18/2019 0934   CREATININE 1.16 10/08/2018 1531   CREATININE 1.0 06/05/2015 1036   GLU 152 03/30/2016 0000      Component Value Date/Time   CALCIUM 9.3 07/18/2019 0934   CALCIUM 8.9 01/27/2017 1044   CALCIUM 9.4 06/05/2015 1036   ALKPHOS 80 07/18/2019 0934   ALKPHOS 126 (H) 01/27/2017 1044   ALKPHOS 94 06/05/2015 1036   AST 22 07/18/2019 0934   AST 22 06/05/2015 1036   ALT 30 07/18/2019 0934   ALT 33 01/27/2017 1044   ALT 18 06/05/2015 1036   BILITOT 0.7 07/18/2019 0934   BILITOT 0.59 06/05/2015 1036       Impression and Plan: [name] [name] is a very pleasant 79 yo caucasian gentleman with history of IgG kappa myeloma.  He underwent standard induction chemotherapy and then ultimately underwent stem cell transplant at [facility] in February 2017.   I am not sure what is going on with this left breast area.  I suppose this all could be reactive after having the coronavirus vaccine.  His wife is worried about him having lymphoma.  I told him that this could happen but the risk of him developing lymphoma after myeloma would be less than 1%.  We will see about getting a CAT scan tomorrow.  I think this will give [place] what we need to know.  He will get Zometa today.  I would like to have him  come back in probably 6 weeks so we can see how things are going with this left chest wall area.       [name], MD 6/3/202110:31 AM

## 2019-07-19 LAB — PROTEIN ELECTROPHORESIS, SERUM, WITH REFLEX
A/G Ratio: 1.3 (ref 0.7–1.7)
Albumin ELP: 3.5 g/dL (ref 2.9–4.4)
Alpha-1-Globulin: 0.2 g/dL (ref 0.0–0.4)
Alpha-2-Globulin: 0.7 g/dL (ref 0.4–1.0)
Beta Globulin: 1 g/dL (ref 0.7–1.3)
Gamma Globulin: 0.7 g/dL (ref 0.4–1.8)
Globulin, Total: 2.6 g/dL (ref 2.2–3.9)
Total Protein ELP: 6.1 g/dL (ref 6.0–8.5)

## 2019-07-19 LAB — IGG, IGA, IGM
IgA: 58 mg/dL — ABNORMAL LOW (ref 61–437)
IgG (Immunoglobin G), Serum: 714 mg/dL (ref 603–1613)
IgM (Immunoglobulin M), Srm: 34 mg/dL (ref 15–143)

## 2019-07-19 LAB — KAPPA/LAMBDA LIGHT CHAINS
Kappa free light chain: 17.3 mg/L (ref 3.3–19.4)
Kappa, lambda light chain ratio: 1.71 — ABNORMAL HIGH (ref 0.26–1.65)
Lambda free light chains: 10.1 mg/L (ref 5.7–26.3)

## 2019-07-21 ENCOUNTER — Encounter: Payer: Self-pay | Admitting: Hematology & Oncology

## 2019-07-22 ENCOUNTER — Other Ambulatory Visit: Payer: Self-pay

## 2019-07-22 ENCOUNTER — Ambulatory Visit (INDEPENDENT_AMBULATORY_CARE_PROVIDER_SITE_OTHER): Payer: Medicare Other

## 2019-07-22 DIAGNOSIS — C773 Secondary and unspecified malignant neoplasm of axilla and upper limb lymph nodes: Secondary | ICD-10-CM | POA: Diagnosis not present

## 2019-07-22 DIAGNOSIS — R911 Solitary pulmonary nodule: Secondary | ICD-10-CM | POA: Diagnosis not present

## 2019-07-22 DIAGNOSIS — C9001 Multiple myeloma in remission: Secondary | ICD-10-CM

## 2019-07-22 MED ORDER — IOPAMIDOL (ISOVUE-300) INJECTION 61%
100.0000 mL | Freq: Once | INTRAVENOUS | Status: AC | PRN
Start: 2019-07-22 — End: 2019-07-22
  Administered 2019-07-22: 75 mL via INTRAVENOUS

## 2019-07-23 ENCOUNTER — Telehealth: Payer: Self-pay | Admitting: *Deleted

## 2019-07-23 NOTE — Telephone Encounter (Signed)
Patient notified per order of Dr. Marin Olp that "there is no swollen nodes in the axilla.  You do have small lung nodules that appear to be inflammatory and NOT cancerous.  We will do another scan in 12 months."  Pt appreciative of call and would like to know what to do next in regards to the pain in his left axilla and breast.  Informed pt that Dr. Marin Olp has left the office for today and that I would call him tomorrow with Dr. Antonieta Pert recommendations.  Pt appreciative of assistance.

## 2019-07-23 NOTE — Telephone Encounter (Signed)
-----   Message from [name], MD sent at 07/22/2019  5:41 PM EDT ----- Call - there is no swollen nodes in the axilla.  You do have small lung nodules that appear to be inflammatory and NOT cancerous.  We will do another scan in 12 months.  [name] [name]

## 2019-07-24 ENCOUNTER — Telehealth: Payer: Self-pay | Admitting: *Deleted

## 2019-07-24 MED ORDER — NITROGLYCERIN 0.4 MG SL SUBL: 0.4000 mg | SUBLINGUAL_TABLET | SUBLINGUAL | 3 refills | Status: DC | PRN

## 2019-07-24 NOTE — Telephone Encounter (Signed)
F/U call placed to patient to notify him per order of Dr. Marin Olp that next steps for pain to left axilla and breast will be to wait on MRI results scheduled for 08/10/19.  Pt appreciative of call back and has no further questions at this time.

## 2019-08-01 NOTE — Progress Notes (Signed)
HPI: FU coronary artery disease. Last cardiac catheterization 1/16showed significant proximal RCA disease, occluded circumflex that filled from left to left collaterals and nonobstructive disease in the LAD. Patient had PCI of the right coronary artery with a drug-eluting stent. Patient has also had recurrent pulmonary emboli in the past and is on chronic xarelto. Nuclear study February 2018 showed normal perfusion. Ejection fraction 50%. Echocardiogram March 2018 showed normal LV systolic function, mild mitral regurgitation and mild left atrial enlargement. Since last seen,  patient had Covid shot late January.  Since then he has had pain in his left shoulder and gynecomastia.  He has an MRI scheduled.  No last 2 weeks he has noted substernal chest pain that has been essentially continuous.  It increases with certain movements of his left arm and also occasional inspiration.  He notes dyspnea on exertion but no orthopnea, PND or pedal edema.  Current Outpatient Medications  Medication Sig Dispense Refill  . amLODipine (NORVASC) 5 MG tablet TAKE 1 TABLET DAILY 90 tablet 3  . aspirin EC 81 MG tablet Take by mouth.    . carvedilol (COREG) 6.25 MG tablet TAKE 1 TABLET TWICE DAILY  WITH MEALS 180 tablet 3  . cetirizine (EQ ALLERGY RELIEF, CETIRIZINE,) 10 MG tablet Take 1 tablet (10 mg total) by mouth daily. 90 tablet 3  . Cyanocobalamin (RA VITAMIN B-12 TR) 1000 MCG TBCR Take 1 tablet by mouth daily.     Marland Kitchen esomeprazole (NEXIUM) 40 MG capsule TAKE 1 CAPSULE DAILY AT 12 NOON 90 capsule 3  . famciclovir (FAMVIR) 500 MG tablet TAKE 1 TABLET DAILY 90 tablet 3  . fluticasone (FLONASE) 50 MCG/ACT nasal spray Place 2 sprays into both nostrils daily. 16 g 2  . gabapentin (NEURONTIN) 600 MG tablet TAKE 1 TABLET EVERY MORNINGAND 2 TABLETS EVERY NIGHT 270 tablet 3  . hydrochlorothiazide (MICROZIDE) 12.5 MG capsule TAKE 1 CAPSULE DAILY 90 capsule 3  . isosorbide mononitrate (IMDUR) 30 MG 24 hr tablet Take 1  tablet (30 mg total) by mouth 2 (two) times daily. 180 tablet 3  . KLOR-CON M20 20 MEQ tablet TAKE 1 TABLET TWICE A DAY 180 tablet 0  . levothyroxine (SYNTHROID) 100 MCG tablet Take 1 tablet (100 mcg total) by mouth daily before breakfast. 90 tablet 3  . LORazepam (ATIVAN) 1 MG tablet Take 1 tablet (1 mg total) by mouth at bedtime. 90 tablet 0  . nitroGLYCERIN (NITROSTAT) 0.4 MG SL tablet Place 1 tablet (0.4 mg total) under the tongue every 5 (five) minutes as needed. For chest pain 10 tablet 3  . oxyCODONE-acetaminophen (PERCOCET) 10-325 MG tablet Take 1 tablet by mouth every 8 (eight) hours as needed. 60 tablet 0  . pyridoxine (B-6) 100 MG tablet Take 100 mg by mouth daily.     . rivaroxaban (XARELTO) 10 MG TABS tablet Take 1 tablet (10 mg total) by mouth daily with supper. 90 tablet 3  . tamsulosin (FLOMAX) 0.4 MG CAPS capsule TAKE 1 CAPSULE DAILY AFTER BREAKFAST 90 capsule 3   No current facility-administered medications for this visit.     Past Medical History:  Diagnosis Date  . Acute deep vein thrombosis (DVT) of tibial vein of right lower extremity ([place]) 09/29/2014  . Arthritis    back  . Asthma    uses Advair bid and asmanex prn  . Cancer ([place])    multiple myeloma  . Chronic back pain    buldging disc and scoliosis  . Chronic bronchitis (  [place])    couple of yrs ago  . Complication of anesthesia    severe case of hiccups after hernia surgery;required medication  . Diverticulosis   . Dizziness    related to neck issues  . Emphysema   . Enlarged prostate    takes Flomax daily  . GERD (gastroesophageal reflux disease)    takes PRotonix daily  . History of colon polyps   . Hyperlipidemia    takes Zocor daily  . Hypertension    takes HCTZ,Diovan daily and Isosorbide daily  . Itching    down left arm and leg  . Joint pain   . Macular degeneration    mild,beginning  . Multiple myeloma ([place]) 08/13/2014  . Myocardial infarction ([employer]) 2010  . Neck pain   . Pain in limb  06/20/2012  . Pneumonia    hx of---12+yrs ago    Past Surgical History:  Procedure Laterality Date  . ANTERIOR CERVICAL DECOMP/DISCECTOMY FUSION  01/31/2012   Procedure: ANTERIOR CERVICAL DECOMPRESSION/DISCECTOMY FUSION 1 LEVEL;  Surgeon: Floyce Stakes, MD;  Location: [facility];  Service: Neurosurgery;  Laterality: N/A;  Cervical Five-Six  Anterior Cervical Decompression /Diskectomy /Fusion/Plate  . BONE MARROW TRANSPLANT    . CARDIAC CATHETERIZATION  2010/2013   06/07/11 [facility]) LAD 25%, D2 75% (small), pLCx 100%, RCA 25%--medical managment   . COLONOSCOPY    . HERNIA REPAIR  2006   x 3   . salivary gland removed     left   . skin cancer removed from left side of face     basal cell carcinoma  . vein removed from left leg      Social History   Socioeconomic History  . Marital status: Married    Spouse name: Not on file  . Number of children: 3  . Years of education: Not on file  . Highest education level: Not on file  Occupational History  . Not on file  Tobacco Use  . Smoking status: Former Smoker    Years: 30.00    Types: Pipe    Quit date: 11/24/2011    Years since quitting: 7.7  . Smokeless tobacco: Former Systems developer    Quit date: 02/15/2011  . Tobacco comment: quit tobacco 3 years ago  Vaping Use  . Vaping Use: Never used  Substance and Sexual Activity  . Alcohol use: Yes    Alcohol/week: 0.0 standard drinks    Comment: 1-2 glasses of wine daily  . Drug use: No  . Sexual activity: Yes  Other Topics Concern  . Not on file  Social History Narrative  . Not on file   Social Determinants of Health   Financial Resource Strain:   . Difficulty of Paying Living Expenses:   Food Insecurity:   . Worried About Charity fundraiser in the Last Year:   . Arboriculturist in the Last Year:   Transportation Needs:   . Film/video editor (Medical):   Marland Kitchen Lack of Transportation (Non-Medical):   Physical Activity:   . Days of Exercise per Week:   . Minutes of Exercise  per Session:   Stress:   . Feeling of Stress :   Social Connections:   . Frequency of Communication with Friends and Family:   . Frequency of Social Gatherings with Friends and Family:   . Attends Religious Services:   . Active Member of Clubs or Organizations:   . Attends Archivist Meetings:   Marland Kitchen Marital Status:  Intimate Partner Violence:   . Fear of Current or Ex-Partner:   . Emotionally Abused:   Marland Kitchen Physically Abused:   . Sexually Abused:     Family History  Problem Relation Age of Onset  . Hypertension Mother   . Heart attack Father   . Cancer Sister   . Cancer Sister     ROS: no fevers or chills, productive cough, hemoptysis, dysphasia, odynophagia, melena, hematochezia, dysuria, hematuria, rash, seizure activity, orthopnea, PND, pedal edema, claudication. Remaining systems are negative.  Physical Exam: Well-developed well-nourished in no acute distress.  Skin is warm and dry.  HEENT is normal.  Neck is supple.  Chest is clear to auscultation with normal expansion.  Cardiovascular exam is regular rate and rhythm.  Abdominal exam nontender or distended. No masses palpated. Extremities show no edema. neuro grossly intact  ECG-sinus rhythm with first-degree AV block, right bundle branch block.  Personally reviewed  A/P  1 CAD-continue aspirin.  Patient had increased liver functions with statins previously.  2 chest pain-patient describes substernal chest pain that has been continuous for the past 2 weeks.  It increases with certain arm movements and sounds likely to be musculoskeletal.  His electrocardiogram shows no new ST changes.  I have asked him to take Motrin 600 mg p.o. 3 times daily for 7 days.  We will hold aspirin while he is on this medication.  He is scheduled to have an MRI of his left shoulder in 4 days.  Given history of coronary disease we will also arrange echocardiogram to assess LV function and Lexiscan nuclear study to screen for  ischemia.  3 hypertension-BP controlled; continue present meds and follow.  4 hyperlipidemia-statins caused increased LFTs; check lipids; if elevated, will consider zetia.  5 recurrent pulmonary emboli-continue xarelto.  6 multiple myeloma-per oncology.  Kirk Ruths, MD

## 2019-08-02 ENCOUNTER — Telehealth: Payer: Self-pay | Admitting: Cardiology

## 2019-08-02 NOTE — Telephone Encounter (Signed)
° ° °  Pt is requesting to bring wife to his appt on 08/07/19, he said he easily forget things and his wife will be helping him to remember important information

## 2019-08-02 NOTE — Telephone Encounter (Signed)
Called and spoke with pt, notified it was fine for his wife to accompany him to his appt. Pt verbalized understanding and had no other questions at this time.

## 2019-08-07 ENCOUNTER — Other Ambulatory Visit: Payer: Self-pay

## 2019-08-07 ENCOUNTER — Encounter: Payer: Self-pay | Admitting: *Deleted

## 2019-08-07 ENCOUNTER — Encounter: Payer: Self-pay | Admitting: Cardiology

## 2019-08-07 ENCOUNTER — Ambulatory Visit (INDEPENDENT_AMBULATORY_CARE_PROVIDER_SITE_OTHER): Payer: Medicare Other | Admitting: Cardiology

## 2019-08-07 VITALS — BP 138/72 | HR 53 | Ht 72.0 in | Wt 214.0 lb

## 2019-08-07 DIAGNOSIS — Z9861 Coronary angioplasty status: Secondary | ICD-10-CM | POA: Diagnosis not present

## 2019-08-07 DIAGNOSIS — I10 Essential (primary) hypertension: Secondary | ICD-10-CM | POA: Diagnosis not present

## 2019-08-07 DIAGNOSIS — R072 Precordial pain: Secondary | ICD-10-CM | POA: Diagnosis not present

## 2019-08-07 DIAGNOSIS — I2510 Atherosclerotic heart disease of native coronary artery without angina pectoris: Secondary | ICD-10-CM

## 2019-08-07 MED ORDER — IBUPROFEN 600 MG PO TABS: 600.0000 mg | ORAL_TABLET | Freq: Four times a day (QID) | ORAL | 0 refills | Status: DC | PRN

## 2019-08-07 NOTE — Patient Instructions (Signed)
Medication Instructions:   STOP ASPIRIN  START MOTRIN 600 MG ONE TABLET THREE TIMES DAILY X 7 DAYS THEN STOP  ONCE STOP MOTRIN RESTART ASPIRIN 81 MG ONCE DAILY  *If you need a refill on your cardiac medications before your next appointment, please call your pharmacy*   Lab Work: Your physician recommends that you return for lab work PRIOR TO EATING  If you have labs (blood work) drawn today and your tests are completely normal, you will receive your results only by: Marland Kitchen MyChart Message (if you have MyChart) OR . A paper copy in the mail If you have any lab test that is abnormal or we need to change your treatment, we will call you to review the results.   Testing/Procedures:  Your physician has requested that you have an echocardiogram. Echocardiography is a painless test that uses sound waves to create images of your heart. It provides your doctor with information about the size and shape of your heart and how well your heart's chambers and valves are working. This procedure takes approximately one hour. There are no restrictions for this procedure.[place] has requested that you have a lexiscan myoview. For further information please visit [email or web address]. Please follow instruction sheet, as given.[place]     Follow-Up: At [facility], you and your health needs are our priority.  As part of our continuing mission to provide you with exceptional heart care, we have created designated Provider Care Teams.  These Care Teams include your primary Cardiologist (physician) and Advanced Practice Providers (APPs -  Physician Assistants and Nurse Practitioners) who all work together to provide you with the care you need, when you need it.  We recommend signing up for the patient portal called "MyChart".  Sign up information is provided on this After Visit Summary.  MyChart is used to connect with patients for Virtual Visits (Telemedicine).   Patients are able to view lab/test results, encounter notes, upcoming appointments, etc.  Non-urgent messages can be sent to your provider as well.   To learn more about what you can do with MyChart, go to [email or web address].    Your next appointment:   3 month(s)  The format for your next appointment:   In Person  Provider:   Kirk Ruths, MD

## 2019-08-10 ENCOUNTER — Other Ambulatory Visit: Payer: Self-pay

## 2019-08-10 ENCOUNTER — Ambulatory Visit
Admission: RE | Admit: 2019-08-10 | Discharge: 2019-08-10 | Disposition: A | Discharge status: Home / Self Care | Payer: Medicare Other | Source: Ambulatory Visit | Attending: Sports Medicine | Admitting: Sports Medicine

## 2019-08-10 LAB — LIPID PANEL
Chol/HDL Ratio: 5 ratio (ref 0.0–5.0)
Cholesterol, Total: 149 mg/dL (ref 100–199)
HDL: 30 mg/dL — ABNORMAL LOW (ref >39)
LDL Chol Calc (NIH): 84 mg/dL (ref 0–99)
Triglycerides: 205 mg/dL — ABNORMAL HIGH (ref 0–149)
VLDL Cholesterol Cal: 35 mg/dL (ref 5–40)

## 2019-08-10 MED ORDER — GADOBUTROL 1 MMOL/ML IV SOLN
10.0000 mL | Freq: Once | INTRAVENOUS | Status: AC | PRN
  Administered 2019-08-10: 10 mL via INTRAVENOUS

## 2019-08-12 DIAGNOSIS — I2510 Atherosclerotic heart disease of native coronary artery without angina pectoris: Secondary | ICD-10-CM

## 2019-08-12 NOTE — Addendum Note (Signed)
Addended by: Silverio Decamp on: 08/12/2019 08:39 AM   Modules accepted: Orders

## 2019-08-13 MED ORDER — EZETIMIBE 10 MG PO TABS: 10.0000 mg | ORAL_TABLET | Freq: Every day | ORAL | 3 refills | Status: DC

## 2019-08-20 NOTE — Addendum Note (Signed)
Addended by: Caprice Beaver T on: 08/20/2019 07:52 AM   Modules accepted: Orders

## 2019-08-21 ENCOUNTER — Encounter: Payer: Self-pay | Admitting: Hematology & Oncology

## 2019-08-21 ENCOUNTER — Inpatient Hospital Stay (HOSPITAL_BASED_OUTPATIENT_CLINIC_OR_DEPARTMENT_OTHER): Payer: Medicare Other | Admitting: Hematology & Oncology

## 2019-08-21 ENCOUNTER — Other Ambulatory Visit: Payer: Self-pay

## 2019-08-21 ENCOUNTER — Telehealth: Payer: Self-pay | Admitting: Hematology & Oncology

## 2019-08-21 ENCOUNTER — Inpatient Hospital Stay: Payer: Medicare Other | Attending: Hematology & Oncology

## 2019-08-21 VITALS — BP 123/75 | HR 63 | Temp 98.0°F | Resp 18 | Wt 212.8 lb

## 2019-08-21 DIAGNOSIS — I2510 Atherosclerotic heart disease of native coronary artery without angina pectoris: Secondary | ICD-10-CM | POA: Insufficient documentation

## 2019-08-21 DIAGNOSIS — C9001 Multiple myeloma in remission: Secondary | ICD-10-CM

## 2019-08-21 DIAGNOSIS — C9000 Multiple myeloma not having achieved remission: Secondary | ICD-10-CM | POA: Diagnosis not present

## 2019-08-21 DIAGNOSIS — G629 Polyneuropathy, unspecified: Secondary | ICD-10-CM | POA: Insufficient documentation

## 2019-08-21 DIAGNOSIS — Z79899 Other long term (current) drug therapy: Secondary | ICD-10-CM | POA: Diagnosis not present

## 2019-08-21 DIAGNOSIS — Z7901 Long term (current) use of anticoagulants: Secondary | ICD-10-CM | POA: Diagnosis not present

## 2019-08-21 DIAGNOSIS — R079 Chest pain, unspecified: Secondary | ICD-10-CM | POA: Insufficient documentation

## 2019-08-21 DIAGNOSIS — R911 Solitary pulmonary nodule: Secondary | ICD-10-CM | POA: Insufficient documentation

## 2019-08-21 DIAGNOSIS — Z9861 Coronary angioplasty status: Secondary | ICD-10-CM

## 2019-08-21 DIAGNOSIS — Z885 Allergy status to narcotic agent status: Secondary | ICD-10-CM | POA: Diagnosis not present

## 2019-08-21 LAB — CMP (CANCER CENTER ONLY)
ALT: 34 U/L (ref 0–44)
AST: 26 U/L (ref 15–41)
Albumin: 4.5 g/dL (ref 3.5–5.0)
Alkaline Phosphatase: 86 U/L (ref 38–126)
Anion gap: 9 (ref 5–15)
BUN: 14 mg/dL (ref 8–23)
CO2: 31 mmol/L (ref 22–32)
Calcium: 9.7 mg/dL (ref 8.9–10.3)
Chloride: 101 mmol/L (ref 98–111)
Creatinine: 1.11 mg/dL (ref 0.61–1.24)
GFR, Est AFR Am: >60 mL/min (ref >60)
GFR, Estimated: >60 mL/min (ref >60)
Glucose, Bld: 104 mg/dL — ABNORMAL HIGH (ref 70–99)
Potassium: 3.4 mmol/L — ABNORMAL LOW (ref 3.5–5.1)
Sodium: 141 mmol/L (ref 135–145)
Total Bilirubin: 0.8 mg/dL (ref 0.3–1.2)
Total Protein: 6.8 g/dL (ref 6.5–8.1)

## 2019-08-21 LAB — CBC WITH DIFFERENTIAL (CANCER CENTER ONLY)
Abs Immature Granulocytes: 0.02 10*3/uL (ref 0.00–0.07)
Basophils Absolute: 0.1 10*3/uL (ref 0.0–0.1)
Basophils Relative: 1 %
Eosinophils Absolute: 0.2 10*3/uL (ref 0.0–0.5)
Eosinophils Relative: 2 %
HCT: 41.8 % (ref 39.0–52.0)
Hemoglobin: 14.3 g/dL (ref 13.0–17.0)
Immature Granulocytes: 0 %
Lymphocytes Relative: 32 %
Lymphs Abs: 2.3 10*3/uL (ref 0.7–4.0)
MCH: 30.8 pg (ref 26.0–34.0)
MCHC: 34.2 g/dL (ref 30.0–36.0)
MCV: 90.1 fL (ref 80.0–100.0)
Monocytes Absolute: 0.6 10*3/uL (ref 0.1–1.0)
Monocytes Relative: 8 %
Neutro Abs: 4 10*3/uL (ref 1.7–7.7)
Neutrophils Relative %: 57 %
Platelet Count: 159 10*3/uL (ref 150–400)
RBC: 4.64 MIL/uL (ref 4.22–5.81)
RDW: 13.2 % (ref 11.5–15.5)
WBC Count: 7.1 10*3/uL (ref 4.0–10.5)
nRBC: 0 % (ref 0.0–0.2)

## 2019-08-21 NOTE — Progress Notes (Signed)
Hematology and Oncology Follow Up Visit  [name] [name] [id number] September 19, 1940 79 y.o. 08/21/2019   Principle Diagnosis:  IgG kappa myeloma - Trisomy 11 by FISH Acute thromboembolism of the right lower leg  Past Therapy:        S/P ASCT at [facility] on 04/02/2015 Patient s/p cycle 5 of Velcade/Revlimid/Decadron Revlimid 10mg  po q day (21/28) -- d/c on 09/15/2017  Current Therapy:   Zometa 4 mg IV every3 months  - next dose due 10/2019 Xarelto 10 mg by mouth daily   Interim History:  [name] [name] is here today for follow-up.  Unfortunately, [name] [name] still having problems with this chest pain.  [name] [name] actually will be seen cardiology.  I think [name] [name] has seen cardiology and is going to have an echocardiogram and a stress test next week.  [name] [name] had a CT of the chest back in early June.  No adenopathy was noted.  [name] [name] may have had an indolent type of infectious process in his lungs.  A left lower lobe pulmonary nodule is minimally larger but felt to be benign.  [name] [name] is noted to have two-vessel coronary artery disease.  The chest pain seems to improve with nitro.  Again, I really have to believe that [name] [name] is going to be headed toward a cardiac cath.  Myeloma has absolutely no issue right now.  There is no monoclonal spike in his blood.  [name] [name] has had no fever.  There is no rashes.  [name] [name] has had no problems with leg swelling.  His liver function studies have improved.  [name] [name] had think [name] [name] now is off statin drug and on Zetia.  There is no problems with bowels or bladder.  [name] [name] still has a little neuropathy in his feet.  Overall, his performance status is ECOG 1.    Medications:  Allergies as of 08/21/2019      Reactions   Budesonide-formoterol Fumarate Itching, Other (See Comments)   Codeine Itching   Hydrocodone-acetaminophen Itching   Mushroom Extract Complex Nausea And Vomiting, Other (See Comments)   Only shitake mushroom  causes this reaction. Flu-like symptoms from portabello mushrooms      Medication List        Accurate as of August 21, 2019  3:23 PM. If you have any questions, ask your nurse or doctor.        amLODipine 5 MG tablet Commonly known as: NORVASC TAKE 1 TABLET DAILY   aspirin EC 81 MG tablet Take by mouth.   carvedilol 6.25 MG tablet Commonly known as: COREG TAKE 1 TABLET TWICE DAILY  WITH MEALS   cetirizine 10 MG tablet Commonly known as: EQ Allergy Relief (Cetirizine) Take 1 tablet (10 mg total) by mouth daily.   esomeprazole 40 MG capsule Commonly known as: NEXIUM TAKE 1 CAPSULE DAILY AT 12 NOON   ezetimibe 10 MG tablet Commonly known as: ZETIA Take 1 tablet (10 mg total) by mouth daily.   famciclovir 500 MG tablet Commonly known as: FAMVIR TAKE 1 TABLET DAILY   fluticasone 50 MCG/ACT nasal spray Commonly known as: FLONASE Place 2 sprays into both nostrils daily.   gabapentin 600 MG tablet Commonly known as: NEURONTIN TAKE 1 TABLET EVERY MORNINGAND 2 TABLETS EVERY NIGHT   hydrochlorothiazide 12.5 MG capsule Commonly known as: MICROZIDE TAKE 1 CAPSULE DAILY   ibuprofen 600 MG tablet Commonly known as: ADVIL Take 1 tablet (600 mg total) by mouth every 6 (six) hours as needed.   isosorbide mononitrate 30 MG 24 hr tablet Commonly known as:  IMDUR Take 1 tablet (30 mg total) by mouth 2 (two) times daily.   Klor-Con M20 20 MEQ tablet Generic drug: potassium chloride SA TAKE 1 TABLET TWICE A DAY   levothyroxine 100 MCG tablet Commonly known as: Synthroid Take 1 tablet (100 mcg total) by mouth daily before breakfast.   LORazepam 1 MG tablet Commonly known as: ATIVAN Take 1 tablet (1 mg total) by mouth at bedtime.   nitroGLYCERIN 0.4 MG SL tablet Commonly known as: NITROSTAT Place 1 tablet (0.4 mg total) under the tongue every 5 (five) minutes as needed. For chest pain   oxyCODONE-acetaminophen 10-325 MG tablet Commonly known as: PERCOCET Take 1 tablet by mouth every 8 (eight) hours as needed.   pyridoxine 100 MG tablet Commonly known as:  B-6 Take 100 mg by mouth daily.   RA Vitamin B-12 TR 1000 MCG Tbcr Generic drug: Cyanocobalamin Take 1 tablet by mouth daily.   rivaroxaban 10 MG Tabs tablet Commonly known as: XARELTO Take 1 tablet (10 mg total) by mouth daily with supper.   tamsulosin 0.4 MG Caps capsule Commonly known as: FLOMAX TAKE 1 CAPSULE DAILY AFTER BREAKFAST       Allergies:  Allergies  Allergen Reactions  . Budesonide-Formoterol Fumarate Itching and Other (See Comments)  . Codeine Itching  . Hydrocodone-Acetaminophen Itching  . Mushroom Extract Complex Nausea And Vomiting and Other (See Comments)    Only shitake mushroom  causes this reaction. Flu-like symptoms from portabello mushrooms    Past Medical History, Surgical history, Social history, and Family History were reviewed and updated.  Review of Systems: Review of Systems  Constitutional: Negative.   HENT: Negative.   Eyes: Negative.   Respiratory: Negative.   Cardiovascular: Negative.   Gastrointestinal: Negative.   Genitourinary: Negative.   Musculoskeletal: Negative.   Skin: Negative.   Neurological: Negative.   Endo/Heme/Allergies: Negative.   Psychiatric/Behavioral: Negative.    Marland Kitchen   Physical Exam:  weight is 212 lb 12 oz (96.5 kg). His oral temperature is 98 F (36.7 C). His blood pressure is 123/75 and his pulse is 63. His respiration is 18 and oxygen saturation is 97%.   Wt Readings from Last 3 Encounters:  08/21/19 212 lb 12 oz (96.5 kg)  08/07/19 214 lb (97.1 kg)  07/18/19 212 lb (96.2 kg)    Physical Exam Vitals reviewed.  Constitutional:      Comments: Chest wall exam shows the right breast area to be without swelling.  There is no tenderness.  There is no right axillary adenopathy.  Left breast area is somewhat swollen.  There is a little bit of firmness about the areola.  There is palpable lymph node in the left axilla.  It probably measures may be a centimeter.  It is mobile and nontender.  HENT:     Head:  Normocephalic and atraumatic.  Eyes:     Pupils: Pupils are equal, round, and reactive to light.  Cardiovascular:     Rate and Rhythm: Normal rate and regular rhythm.     Heart sounds: Normal heart sounds.  Pulmonary:     Effort: Pulmonary effort is normal.     Breath sounds: Normal breath sounds.  Abdominal:     General: Bowel sounds are normal.     Palpations: Abdomen is soft.  Musculoskeletal:        General: No tenderness or deformity. Normal range of motion.     Cervical back: Normal range of motion.  Lymphadenopathy:     Cervical: No  cervical adenopathy.  Skin:    General: Skin is warm and dry.     Findings: No erythema or rash.  Neurological:     Mental Status: [name] [name] is alert and oriented to person, place, and time.  Psychiatric:        Behavior: Behavior normal.        Thought Content: Thought content normal.        Judgment: Judgment normal.      Lab Results  Component Value Date   WBC 7.1 08/21/2019   HGB 14.3 08/21/2019   HCT 41.8 08/21/2019   MCV 90.1 08/21/2019   PLT 159 08/21/2019   Lab Results  Component Value Date   FERRITIN 199 07/18/2019   IRON 65 07/18/2019   TIBC 316 07/18/2019   UIBC 251 07/18/2019   IRONPCTSAT 21 07/18/2019   Lab Results  Component Value Date   RETICCTPCT 1.7 07/18/2019   RBC 4.64 08/21/2019   Lab Results  Component Value Date   KPAFRELGTCHN 17.3 07/18/2019   LAMBDASER 10.1 07/18/2019   KAPLAMBRATIO 1.71 (H) 07/18/2019   Lab Results  Component Value Date   IGGSERUM 714 07/18/2019   IGA 58 (L) 07/18/2019   IGMSERUM 34 07/18/2019   Lab Results  Component Value Date   TOTALPROTELP 6.1 07/18/2019   ALBUMINELP 3.5 07/18/2019   A1GS 0.2 07/18/2019   A2GS 0.7 07/18/2019   BETS 1.0 07/18/2019   BETA2SER 0.3 02/11/2015   GAMS 0.7 07/18/2019   MSPIKE Not Observed 07/18/2019   SPEI Comment 01/17/2019     Chemistry      Component Value Date/Time   NA 141 08/21/2019 1403   NA 145 (H) 06/28/2019 1501   NA 140  01/27/2017 1044   NA 141 06/05/2015 1036   K 3.4 (L) 08/21/2019 1403   K 3.3 01/27/2017 1044   K 2.9 (LL) 06/05/2015 1036   CL 101 08/21/2019 1403   CL 101 01/27/2017 1044   CO2 31 08/21/2019 1403   CO2 28 01/27/2017 1044   CO2 27 06/05/2015 1036   BUN 14 08/21/2019 1403   BUN 15 06/28/2019 1501   BUN 14 01/27/2017 1044   BUN 13.1 06/05/2015 1036   CREATININE 1.11 08/21/2019 1403   CREATININE 1.16 10/08/2018 1531   CREATININE 1.0 06/05/2015 1036   GLU 152 03/30/2016 0000      Component Value Date/Time   CALCIUM 9.7 08/21/2019 1403   CALCIUM 8.9 01/27/2017 1044   CALCIUM 9.4 06/05/2015 1036   ALKPHOS 86 08/21/2019 1403   ALKPHOS 126 (H) 01/27/2017 1044   ALKPHOS 94 06/05/2015 1036   AST 26 08/21/2019 1403   AST 22 06/05/2015 1036   ALT 34 08/21/2019 1403   ALT 33 01/27/2017 1044   ALT 18 06/05/2015 1036   BILITOT 0.8 08/21/2019 1403   BILITOT 0.59 06/05/2015 1036       Impression and Plan: [name] [name] is a very pleasant 79 yo caucasian gentleman with history of IgG kappa myeloma.  [name] [name] underwent standard induction chemotherapy and then ultimately underwent stem cell transplant at [facility] in February 2017.   I am disturbed by the fact that [name] [name] has this chest wall discomfort.  Again this is substernal.  I will know if the left upper chest discomfort might be some kind of referred pain from the mediastinum.  I am glad that [name] [name] has seen cardiology and will have the echocardiogram and stress test next week.  From my point of view, we will get him back  in about a month or so.  By then, I think we will have a much better idea as to what is going on with his pain.         [name], MD 7/7/20213:23 PM

## 2019-08-21 NOTE — Telephone Encounter (Signed)
Appointments scheduled and patient will get updates from My Chart 7/7 los

## 2019-08-22 LAB — PROTEIN ELECTROPHORESIS, SERUM, WITH REFLEX
A/G Ratio: 1.4 (ref 0.7–1.7)
Albumin ELP: 3.9 g/dL (ref 2.9–4.4)
Alpha-1-Globulin: 0.2 g/dL (ref 0.0–0.4)
Alpha-2-Globulin: 0.8 g/dL (ref 0.4–1.0)
Beta Globulin: 1 g/dL (ref 0.7–1.3)
Gamma Globulin: 0.8 g/dL (ref 0.4–1.8)
Globulin, Total: 2.8 g/dL (ref 2.2–3.9)
Total Protein ELP: 6.7 g/dL (ref 6.0–8.5)

## 2019-08-22 LAB — KAPPA/LAMBDA LIGHT CHAINS
Kappa free light chain: 21.7 mg/L — ABNORMAL HIGH (ref 3.3–19.4)
Kappa, lambda light chain ratio: 2.47 — ABNORMAL HIGH (ref 0.26–1.65)
Lambda free light chains: 8.8 mg/L (ref 5.7–26.3)

## 2019-08-22 LAB — IGG, IGA, IGM
IgA: 64 mg/dL (ref 61–437)
IgG (Immunoglobin G), Serum: 841 mg/dL (ref 603–1613)
IgM (Immunoglobulin M), Srm: 42 mg/dL (ref 15–143)

## 2019-08-25 ENCOUNTER — Other Ambulatory Visit: Payer: Self-pay | Admitting: Sports Medicine

## 2019-08-26 ENCOUNTER — Telehealth (HOSPITAL_COMMUNITY): Payer: Self-pay

## 2019-08-27 ENCOUNTER — Other Ambulatory Visit: Payer: Self-pay | Admitting: Sports Medicine

## 2019-08-27 MED ORDER — LORAZEPAM 1 MG PO TABS: 1.0000 mg | ORAL_TABLET | Freq: Every day | ORAL | 0 refills | Status: DC

## 2019-08-29 ENCOUNTER — Ambulatory Visit (HOSPITAL_COMMUNITY): Payer: Medicare Other | Attending: Internal Medicine

## 2019-08-29 ENCOUNTER — Ambulatory Visit (HOSPITAL_BASED_OUTPATIENT_CLINIC_OR_DEPARTMENT_OTHER): Payer: Medicare Other

## 2019-08-29 ENCOUNTER — Other Ambulatory Visit: Payer: Self-pay

## 2019-08-29 DIAGNOSIS — I2510 Atherosclerotic heart disease of native coronary artery without angina pectoris: Secondary | ICD-10-CM

## 2019-08-29 DIAGNOSIS — R072 Precordial pain: Secondary | ICD-10-CM | POA: Diagnosis not present

## 2019-08-29 DIAGNOSIS — Z9861 Coronary angioplasty status: Secondary | ICD-10-CM

## 2019-08-29 LAB — MYOCARDIAL PERFUSION IMAGING
LV dias vol: 112 mL (ref 62–150)
LV sys vol: 52 mL
Peak HR: 65 {beats}/min
Rest HR: 50 {beats}/min
SDS: 3
SRS: 0
SSS: 3
TID: 1.09

## 2019-08-29 LAB — ECHOCARDIOGRAM COMPLETE
Area-P 1/2: 4.49 cm2
Height: 72 in
P 1/2 time: 829 msec
S' Lateral: 3.4 cm
Weight: 3392 oz

## 2019-08-29 MED ORDER — TECHNETIUM TC 99M TETROFOSMIN IV KIT
9.8000 | PACK | Freq: Once | INTRAVENOUS | Status: AC | PRN
  Administered 2019-08-29: 9.8 via INTRAVENOUS
  Filled 2019-08-29: qty 10

## 2019-08-29 MED ORDER — REGADENOSON 0.4 MG/5ML IV SOLN
0.4000 mg | Freq: Once | INTRAVENOUS | Status: AC
Start: 2019-08-29 — End: 2019-08-29
  Administered 2019-08-29: 0.4 mg via INTRAVENOUS

## 2019-08-29 MED ORDER — TECHNETIUM TC 99M TETROFOSMIN IV KIT
31.9000 | PACK | Freq: Once | INTRAVENOUS | Status: AC | PRN
  Administered 2019-08-29: 31.9 via INTRAVENOUS
  Filled 2019-08-29: qty 32

## 2019-09-10 ENCOUNTER — Telehealth: Payer: Self-pay | Admitting: *Deleted

## 2019-09-10 NOTE — Telephone Encounter (Signed)
Spoke with [name] [name], explained we do not usually stop xarelto for 1 tooth extraction. They voiced understanding and will fax this note to them for their records.

## 2019-09-10 NOTE — Telephone Encounter (Signed)
   [facility] Pre-operative Risk Assessment    HEARTCARE STAFF: - Please ensure there is not already an duplicate clearance open for this procedure. - Under Visit Info/Reason for Call, type in Other and utilize the format Clearance MM/DD/YY or Clearance TBD. Do not use dashes or single digits. - If request is for dental extraction, please clarify the # of teeth to be extracted.  Request for surgical clearance:  1. What type of surgery is being performed? EXTRACTION OF 1 TOOTH  2. When is this surgery scheduled? 09/24/19  3. What type of clearance is required (medical clearance vs. Pharmacy clearance to hold med vs. Both)? BOTH  4. Are there any medications that need to be held prior to surgery and how long? XARELTO-NEED DIRECTION  5. Practice name and name of physician performing surgery? [facility]  6. What is the office phone number? [phone]   7.   What is the office fax number? [phone]  8.   Anesthesia type (None, local, MAC, general) ? LOCAL   Fredia Beets 09/10/2019, 10:14 AM  _________________________________________________________________   (provider comments below)

## 2019-09-17 ENCOUNTER — Other Ambulatory Visit: Payer: Medicare Other

## 2019-09-25 ENCOUNTER — Inpatient Hospital Stay: Payer: Medicare Other | Attending: Hematology & Oncology

## 2019-09-25 ENCOUNTER — Other Ambulatory Visit: Payer: Self-pay

## 2019-09-25 DIAGNOSIS — C9001 Multiple myeloma in remission: Secondary | ICD-10-CM | POA: Insufficient documentation

## 2019-09-25 LAB — CMP (CANCER CENTER ONLY)
ALT: 39 U/L (ref 0–44)
AST: 30 U/L (ref 15–41)
Albumin: 4.5 g/dL (ref 3.5–5.0)
Alkaline Phosphatase: 104 U/L (ref 38–126)
Anion gap: 6 (ref 5–15)
BUN: 19 mg/dL (ref 8–23)
CO2: 34 mmol/L — ABNORMAL HIGH (ref 22–32)
Calcium: 10.2 mg/dL (ref 8.9–10.3)
Chloride: 101 mmol/L (ref 98–111)
Creatinine: 1.15 mg/dL (ref 0.61–1.24)
GFR, Est AFR Am: >60 mL/min (ref >60)
GFR, Estimated: >60 mL/min (ref >60)
Glucose, Bld: 97 mg/dL (ref 70–99)
Potassium: 3.7 mmol/L (ref 3.5–5.1)
Sodium: 141 mmol/L (ref 135–145)
Total Bilirubin: 0.9 mg/dL (ref 0.3–1.2)
Total Protein: 7 g/dL (ref 6.5–8.1)

## 2019-09-25 LAB — CBC WITH DIFFERENTIAL (CANCER CENTER ONLY)
Abs Immature Granulocytes: 0.02 10*3/uL (ref 0.00–0.07)
Basophils Absolute: 0 10*3/uL (ref 0.0–0.1)
Basophils Relative: 1 %
Eosinophils Absolute: 0.1 10*3/uL (ref 0.0–0.5)
Eosinophils Relative: 2 %
HCT: 43.8 % (ref 39.0–52.0)
Hemoglobin: 14.6 g/dL (ref 13.0–17.0)
Immature Granulocytes: 0 %
Lymphocytes Relative: 31 %
Lymphs Abs: 2.1 10*3/uL (ref 0.7–4.0)
MCH: 30.2 pg (ref 26.0–34.0)
MCHC: 33.3 g/dL (ref 30.0–36.0)
MCV: 90.7 fL (ref 80.0–100.0)
Monocytes Absolute: 0.6 10*3/uL (ref 0.1–1.0)
Monocytes Relative: 9 %
Neutro Abs: 3.9 10*3/uL (ref 1.7–7.7)
Neutrophils Relative %: 57 %
Platelet Count: 183 10*3/uL (ref 150–400)
RBC: 4.83 MIL/uL (ref 4.22–5.81)
RDW: 12.7 % (ref 11.5–15.5)
WBC Count: 6.8 10*3/uL (ref 4.0–10.5)
nRBC: 0 % (ref 0.0–0.2)

## 2019-09-25 LAB — LACTATE DEHYDROGENASE: LDH: 157 U/L (ref 98–192)

## 2019-09-26 ENCOUNTER — Inpatient Hospital Stay: Payer: Medicare Other

## 2019-09-26 ENCOUNTER — Ambulatory Visit (HOSPITAL_BASED_OUTPATIENT_CLINIC_OR_DEPARTMENT_OTHER)
Admission: RE | Admit: 2019-09-26 | Discharge: 2019-09-26 | Disposition: A | Discharge status: Home / Self Care | Payer: Medicare Other | Source: Ambulatory Visit | Attending: Family | Admitting: Family

## 2019-09-26 ENCOUNTER — Inpatient Hospital Stay (HOSPITAL_BASED_OUTPATIENT_CLINIC_OR_DEPARTMENT_OTHER): Payer: Medicare Other | Admitting: Family

## 2019-09-26 ENCOUNTER — Encounter: Payer: Self-pay | Admitting: Family

## 2019-09-26 VITALS — BP 127/93 | HR 57 | Temp 97.7°F | Resp 17 | Wt 205.4 lb

## 2019-09-26 DIAGNOSIS — M899 Disorder of bone, unspecified: Secondary | ICD-10-CM | POA: Insufficient documentation

## 2019-09-26 DIAGNOSIS — C9001 Multiple myeloma in remission: Secondary | ICD-10-CM | POA: Diagnosis not present

## 2019-09-26 DIAGNOSIS — C9000 Multiple myeloma not having achieved remission: Secondary | ICD-10-CM

## 2019-09-26 DIAGNOSIS — I2510 Atherosclerotic heart disease of native coronary artery without angina pectoris: Secondary | ICD-10-CM

## 2019-09-26 DIAGNOSIS — Z9861 Coronary angioplasty status: Secondary | ICD-10-CM

## 2019-09-26 LAB — PROTEIN ELECTROPHORESIS, SERUM, WITH REFLEX
A/G Ratio: 1.4 (ref 0.7–1.7)
Albumin ELP: 3.9 g/dL (ref 2.9–4.4)
Alpha-1-Globulin: 0.2 g/dL (ref 0.0–0.4)
Alpha-2-Globulin: 0.7 g/dL (ref 0.4–1.0)
Beta Globulin: 0.9 g/dL (ref 0.7–1.3)
Gamma Globulin: 0.8 g/dL (ref 0.4–1.8)
Globulin, Total: 2.7 g/dL (ref 2.2–3.9)
Total Protein ELP: 6.6 g/dL (ref 6.0–8.5)

## 2019-09-26 LAB — KAPPA/LAMBDA LIGHT CHAINS
Kappa free light chain: 33.9 mg/L — ABNORMAL HIGH (ref 3.3–19.4)
Kappa, lambda light chain ratio: 2.57 — ABNORMAL HIGH (ref 0.26–1.65)
Lambda free light chains: 13.2 mg/L (ref 5.7–26.3)

## 2019-09-26 LAB — IGG, IGA, IGM
IgA: 70 mg/dL (ref 61–437)
IgG (Immunoglobin G), Serum: 873 mg/dL (ref 603–1613)
IgM (Immunoglobulin M), Srm: 39 mg/dL (ref 15–143)

## 2019-09-26 NOTE — Progress Notes (Signed)
Hematology and Oncology Follow Up Visit  [name] [name] [id number] Dec 27, 1940 79 y.o. 09/26/2019   Principle Diagnosis:  IgG kappa myeloma - Trisomy 11 by FISH Acute thromboembolism of the right lower leg  Past Therapy: S/P ASCT at [place] on 04/02/2015 Patient s/p cycle 5 of Velcade/Revlimid/Decadron Revlimid 10mg  po q day (21/28) -- d/c on 09/15/2017  Current Therapy:        Zometa 4 mg IV every3 months - next dose due 10/2019 Xarelto 10 mg by mouth daily   Interim History:  [name] [name] is here today for follow-up. He is still having rib and back pain. He states that his bone/skeletal pain has increased.  No adenopathy noted on exam.  He had labs drawn yesterday. No M-spike observed, IgG level stable. Light chains still pending.  He had a stress test and ECHO with Dr. Stanford Breed and both came back normal. He plans to get a second opinion from Dr. Henrene Pastor with [facility] since he is still having the mid sternal chest pain.  No fever, chills, n/v, cough, rash, dizziness, SOB, chest pin, palpitations, abdominal pain or changes in bowel or bladder habits.  He has not noted any blood loss. No bruising or petechiae.  No swelling noted in his extremities at this time. He wears his compression stockings daily for added support.  The neuropathy in his feet is stable/unchanged.  No falls or syncopal episodes to report.  He has maintained a good appetite and is staying well hydrated. His weight is stable.   ECOG Performance Status: 1 - Symptomatic but completely ambulatory  Medications:  Allergies as of 09/26/2019      Reactions   Budesonide-formoterol Fumarate Itching, Other (See Comments)   Codeine Itching   Hydrocodone-acetaminophen Itching   Mushroom Extract Complex Nausea And Vomiting, Other (See Comments)   Only shitake mushroom  causes this reaction. Flu-like symptoms from portabello mushrooms      Medication List       Accurate as of September 26, 2019 11:13 AM. If you have  any questions, ask your nurse or doctor.        amLODipine 5 MG tablet Commonly known as: NORVASC TAKE 1 TABLET DAILY   aspirin EC 81 MG tablet Take by mouth.   carvedilol 6.25 MG tablet Commonly known as: COREG TAKE 1 TABLET TWICE DAILY  WITH MEALS   cetirizine 10 MG tablet Commonly known as: EQ Allergy Relief (Cetirizine) Take 1 tablet (10 mg total) by mouth daily.   esomeprazole 40 MG capsule Commonly known as: NEXIUM TAKE 1 CAPSULE DAILY AT 12 NOON   ezetimibe 10 MG tablet Commonly known as: ZETIA Take 1 tablet (10 mg total) by mouth daily.   famciclovir 500 MG tablet Commonly known as: FAMVIR TAKE 1 TABLET DAILY   fluticasone 50 MCG/ACT nasal spray Commonly known as: FLONASE Place 2 sprays into both nostrils daily.   gabapentin 600 MG tablet Commonly known as: NEURONTIN TAKE 1 TABLET EVERY MORNINGAND 2 TABLETS EVERY NIGHT   hydrochlorothiazide 12.5 MG capsule Commonly known as: MICROZIDE TAKE 1 CAPSULE DAILY   ibuprofen 600 MG tablet Commonly known as: ADVIL Take 1 tablet (600 mg total) by mouth every 6 (six) hours as needed.   isosorbide mononitrate 30 MG 24 hr tablet Commonly known as: IMDUR TAKE 1 TABLET TWICE A DAY   Klor-Con M20 20 MEQ tablet Generic drug: potassium chloride SA TAKE 1 TABLET TWICE A DAY   levothyroxine 100 MCG tablet Commonly known as: Synthroid Take 1 tablet (100  mcg total) by mouth daily before breakfast.   LORazepam 1 MG tablet Commonly known as: ATIVAN Take 1 tablet (1 mg total) by mouth at bedtime.   nitroGLYCERIN 0.4 MG SL tablet Commonly known as: NITROSTAT Place 1 tablet (0.4 mg total) under the tongue every 5 (five) minutes as needed. For chest pain   oxyCODONE-acetaminophen 10-325 MG tablet Commonly known as: PERCOCET Take 1 tablet by mouth every 8 (eight) hours as needed.   pyridoxine 100 MG tablet Commonly known as: B-6 Take 100 mg by mouth daily.   RA Vitamin B-12 TR 1000 MCG Tbcr Generic drug:  Cyanocobalamin Take 1 tablet by mouth daily.   rivaroxaban 10 MG Tabs tablet Commonly known as: XARELTO Take 1 tablet (10 mg total) by mouth daily with supper.   tamsulosin 0.4 MG Caps capsule Commonly known as: FLOMAX TAKE 1 CAPSULE DAILY AFTER BREAKFAST       Allergies:  Allergies  Allergen Reactions  . Budesonide-Formoterol Fumarate Itching and Other (See Comments)  . Codeine Itching  . Hydrocodone-Acetaminophen Itching  . Mushroom Extract Complex Nausea And Vomiting and Other (See Comments)    Only shitake mushroom  causes this reaction. Flu-like symptoms from portabello mushrooms    Past Medical History, Surgical history, Social history, and Family History were reviewed and updated.  Review of Systems: All other 10 point review of systems is negative.   Physical Exam:  weight is 205 lb 6 oz (93.2 kg). His oral temperature is 97.7 F (36.5 C). His blood pressure is 127/93 (abnormal) and his pulse is 57 (abnormal). His respiration is 17 and oxygen saturation is 97%.   Wt Readings from Last 3 Encounters:  09/26/19 205 lb 6 oz (93.2 kg)  08/29/19 212 lb (96.2 kg)  08/21/19 212 lb 12 oz (96.5 kg)    Ocular: Sclerae unicteric, pupils equal, round and reactive to light Ear-nose-throat: Oropharynx clear, dentition fair Lymphatic: No cervical, supraclavicular or axillary adenopathy Lungs no rales or rhonchi, good excursion bilaterally Heart regular rate and rhythm, no murmur appreciated Abd soft, nontender, positive bowel sounds, no liver or spleen tip palpated on exam, no fluid wave  MSK no focal spinal tenderness, no joint edema Neuro: non-focal, well-oriented, appropriate affect Breasts: Deferred   Lab Results  Component Value Date   WBC 6.8 09/25/2019   HGB 14.6 09/25/2019   HCT 43.8 09/25/2019   MCV 90.7 09/25/2019   PLT 183 09/25/2019   Lab Results  Component Value Date   FERRITIN 199 07/18/2019   IRON 65 07/18/2019   TIBC 316 07/18/2019   UIBC 251  07/18/2019   IRONPCTSAT 21 07/18/2019   Lab Results  Component Value Date   RETICCTPCT 1.7 07/18/2019   RBC 4.83 09/25/2019   Lab Results  Component Value Date   KPAFRELGTCHN 21.7 (H) 08/21/2019   LAMBDASER 8.8 08/21/2019   KAPLAMBRATIO 2.47 (H) 08/21/2019   Lab Results  Component Value Date   IGGSERUM 873 09/25/2019   IGA 70 09/25/2019   IGMSERUM 39 09/25/2019   Lab Results  Component Value Date   TOTALPROTELP 6.7 08/21/2019   ALBUMINELP 3.9 08/21/2019   A1GS 0.2 08/21/2019   A2GS 0.8 08/21/2019   BETS 1.0 08/21/2019   BETA2SER 0.3 02/11/2015   GAMS 0.8 08/21/2019   MSPIKE Not Observed 08/21/2019   SPEI Comment 01/17/2019     Chemistry      Component Value Date/Time   NA 141 09/25/2019 1034   NA 145 (H) 06/28/2019 1501   NA 140  01/27/2017 1044   NA 141 06/05/2015 1036   K 3.7 09/25/2019 1034   K 3.3 01/27/2017 1044   K 2.9 (LL) 06/05/2015 1036   CL 101 09/25/2019 1034   CL 101 01/27/2017 1044   CO2 34 (H) 09/25/2019 1034   CO2 28 01/27/2017 1044   CO2 27 06/05/2015 1036   BUN 19 09/25/2019 1034   BUN 15 06/28/2019 1501   BUN 14 01/27/2017 1044   BUN 13.1 06/05/2015 1036   CREATININE 1.15 09/25/2019 1034   CREATININE 1.16 10/08/2018 1531   CREATININE 1.0 06/05/2015 1036   GLU 152 03/30/2016 0000      Component Value Date/Time   CALCIUM 10.2 09/25/2019 1034   CALCIUM 8.9 01/27/2017 1044   CALCIUM 9.4 06/05/2015 1036   ALKPHOS 104 09/25/2019 1034   ALKPHOS 126 (H) 01/27/2017 1044   ALKPHOS 94 06/05/2015 1036   AST 30 09/25/2019 1034   AST 22 06/05/2015 1036   ALT 39 09/25/2019 1034   ALT 33 01/27/2017 1044   ALT 18 06/05/2015 1036   BILITOT 0.9 09/25/2019 1034   BILITOT 0.59 06/05/2015 1036       Impression and Plan: Mr. Davie is a very pleasant 79 yo caucasian gentleman with history of IgG kappa myeloma.  He underwent standard induction chemotherapy and then ultimately stem cell transplant at [facility] in February 2017.  His myeloma studies have  remained stable. He is still having a lot of musculoskeletal and bone pain so we will get a bone survey to rule out lytic lesions.  We will plan to see him back in another month. He is due for Zometa again at that time.  He will contact our office with any questions or concerns.    Laverna Peace, NP 8/12/202111:13 AM

## 2019-09-27 ENCOUNTER — Telehealth: Payer: Self-pay | Admitting: Family

## 2019-09-27 NOTE — Telephone Encounter (Signed)
I was able to speak with both Mr. Bushey and his wife via phone and go over his bone survey results. He does have multiple lucencies throughout the calvarium, bilateral humeri, bilateral femurs, bilateral radii, and left fibula consistent with multiple myeloma.  I did speak with Dr. Marin Olp and our next step will be getting a PET scan.  He still has generalized aches and pains specifically in the chest and ribs.  Once we have his PET scan results we will get both he and his wife in to go over and discuss new treatment plan.  They verbalized understanding, all questions were answered and they are in agreement with the plan.

## 2019-10-02 ENCOUNTER — Other Ambulatory Visit: Payer: Self-pay | Admitting: Family

## 2019-10-02 ENCOUNTER — Encounter: Payer: Self-pay | Admitting: Family

## 2019-10-02 DIAGNOSIS — C9000 Multiple myeloma not having achieved remission: Secondary | ICD-10-CM

## 2019-10-02 DIAGNOSIS — M899 Disorder of bone, unspecified: Secondary | ICD-10-CM

## 2019-10-03 ENCOUNTER — Other Ambulatory Visit: Payer: Self-pay | Admitting: *Deleted

## 2019-10-03 DIAGNOSIS — C9000 Multiple myeloma not having achieved remission: Secondary | ICD-10-CM

## 2019-10-03 MED ORDER — TRAMADOL HCL 50 MG PO TABS: 50.0000 mg | ORAL_TABLET | Freq: Four times a day (QID) | ORAL | 0 refills | Status: DC | PRN

## 2019-10-03 NOTE — Telephone Encounter (Signed)
Dr. Lamonte Sakai please advise on patient mychart message   Hello, Had some pulsating issues with machine.  Appointment with [facility] in [place] today. They say machine is functioning normal under their testing and doing what it is supposed to do, however: it is 79 years old and normal life is 5 years. They need a note from you to change out the machine. There is a new style (211)coming out in a month so I would like to get the new style while I am changing.  Would you authorize for me please with [facility] in [facility]? Thanks [name] [name] [phone]

## 2019-10-08 ENCOUNTER — Ambulatory Visit (HOSPITAL_COMMUNITY)
Admission: RE | Admit: 2019-10-08 | Discharge: 2019-10-08 | Disposition: A | Discharge status: Home / Self Care | Payer: Medicare Other | Source: Ambulatory Visit | Attending: Family | Admitting: Family

## 2019-10-08 ENCOUNTER — Other Ambulatory Visit: Payer: Self-pay

## 2019-10-08 DIAGNOSIS — M899 Disorder of bone, unspecified: Secondary | ICD-10-CM | POA: Diagnosis not present

## 2019-10-08 DIAGNOSIS — C9000 Multiple myeloma not having achieved remission: Secondary | ICD-10-CM | POA: Insufficient documentation

## 2019-10-08 DIAGNOSIS — R918 Other nonspecific abnormal finding of lung field: Secondary | ICD-10-CM | POA: Insufficient documentation

## 2019-10-08 LAB — GLUCOSE, CAPILLARY: Glucose-Capillary: 93 mg/dL (ref 70–99)

## 2019-10-08 MED ORDER — FLUDEOXYGLUCOSE F - 18 (FDG) INJECTION
10.9800 | Freq: Once | INTRAVENOUS | Status: AC | PRN
  Administered 2019-10-08: 10.98 via INTRAVENOUS

## 2019-10-09 NOTE — Telephone Encounter (Signed)
Yes I agree. Please place an order for him to get a new machine when the new version is available. Thanks.

## 2019-10-11 ENCOUNTER — Telehealth: Payer: Self-pay | Admitting: Family

## 2019-10-11 NOTE — Telephone Encounter (Signed)
I was able to speak with [name] [name] and his wife via phone and go over his PET scan result which showed no evidence of recurrent myeloma. He is still having lots of rib and bone pain. I reached out to Dr. Sophronia Simas and he will see him for what sounds like costochondritis. No orthopedist referral needed at this time.  We will also do a letter for his son so he can come visit from [place]. He can pick up next week.

## 2019-10-12 ENCOUNTER — Ambulatory Visit: Payer: Medicare Other | Attending: Internal Medicine

## 2019-10-12 ENCOUNTER — Encounter: Payer: Self-pay | Admitting: Family

## 2019-10-12 DIAGNOSIS — Z23 Encounter for immunization: Secondary | ICD-10-CM

## 2019-10-12 NOTE — Progress Notes (Signed)
   Covid-19 Vaccination Clinic  Name:  Julias Mould    MRN: [id number] DOB: 1940-04-02  10/12/2019  Mr. Cory was observed post Covid-19 immunization for 15 minutes without incident. He was provided with Vaccine Information Sheet and instruction to access the V-Safe system.   Mr. Eisen was instructed to call 911 with any severe reactions post vaccine: Marland Kitchen Difficulty breathing  . Swelling of face and throat  . A fast heartbeat  . A bad rash all over body  . Dizziness and weakness

## 2019-10-14 ENCOUNTER — Encounter: Payer: Self-pay | Admitting: Family

## 2019-10-15 ENCOUNTER — Telehealth: Payer: Self-pay

## 2019-10-15 NOTE — Telephone Encounter (Signed)
Called and informed patient letter he requested is ready to be picked up from the office. Patient confirmed

## 2019-10-22 NOTE — Telephone Encounter (Signed)
It looks like him most recent setting was 10 cm H2O.

## 2019-10-22 NOTE — Telephone Encounter (Signed)
Dr Lamonte Sakai, what settings for his CPAP do you recommend. It looks like we did not originally start this for him. thanks

## 2019-10-23 ENCOUNTER — Ambulatory Visit (INDEPENDENT_AMBULATORY_CARE_PROVIDER_SITE_OTHER): Payer: Medicare Other | Admitting: Sports Medicine

## 2019-10-23 ENCOUNTER — Encounter: Payer: Self-pay | Admitting: Sports Medicine

## 2019-10-23 ENCOUNTER — Other Ambulatory Visit: Payer: Self-pay

## 2019-10-23 DIAGNOSIS — R0789 Other chest pain: Secondary | ICD-10-CM | POA: Diagnosis not present

## 2019-10-23 DIAGNOSIS — Z9861 Coronary angioplasty status: Secondary | ICD-10-CM

## 2019-10-23 DIAGNOSIS — I2510 Atherosclerotic heart disease of native coronary artery without angina pectoris: Secondary | ICD-10-CM

## 2019-10-23 MED ORDER — PREDNISONE 50 MG PO TABS: ORAL_TABLET | ORAL | 0 refills | Status: DC

## 2019-10-23 NOTE — Progress Notes (Signed)
    Procedures performed today:    None.  Independent interpretation of notes and tests performed by another provider:   I personally reviewed his chest CT, there is widespread multilevel facet arthritis in the thoracolumbar spine.  Degenerative disc disease and spinal stenosis is more extensive in the lower lumbar spine.  Brief History, Exam, Impression, and Recommendations:    Chest wall pain [name] [name] has been having some right-sided chest wall pain at the right lower costal cartilage, costal margin. He has been seen by oncology, it sounds as though he had an extensive work-up for recurrence of his multiple myeloma which for the most part was negative with the exception of a small kappa lambda spike. His anterior chest wall pain is consistent with costochondritis, this would be a candidate for an ultrasound-guided injection, perichondral, we are to try burst of prednisone first. He also has some mid thoracolumbar back pain which I think is coming from widespread facet arthritis. He has had interventions into his lumbar spine without significant efficacy so he desires to avoid this if at all possible. I am going to add some physical therapy for his mid back as well as topical modalities for his chest wall pain, and I like to see him back in 4 to 6 weeks. We also set expectations regarding his back pain, he understands there is no cure and certainly we may have to revert back to medical pain management, he is currently only taking up to one of his narcotic pills per day.    ___________________________________________ Gwen Her. Dianah Field, M.D., ABFM., CAQSM. Primary Care and [place] Instructor of [employer] [employer] [facility]

## 2019-10-23 NOTE — Assessment & Plan Note (Signed)
[name] [name] has been having some right-sided chest wall pain at the right lower costal cartilage, costal margin. He has been seen by oncology, it sounds as though he had an extensive work-up for recurrence of his multiple myeloma which for the most part was negative with the exception of a small kappa lambda spike. His anterior chest wall pain is consistent with costochondritis, this would be a candidate for an ultrasound-guided injection, perichondral, we are to try burst of prednisone first. He also has some mid thoracolumbar back pain which I think is coming from widespread facet arthritis. He has had interventions into his lumbar spine without significant efficacy so he desires to avoid this if at all possible. I am going to add some physical therapy for his mid back as well as topical modalities for his chest wall pain, and I like to see him back in 4 to 6 weeks. We also set expectations regarding his back pain, he understands there is no cure and certainly we may have to revert back to medical pain management, he is currently only taking up to one of his narcotic pills per day.

## 2019-10-24 DIAGNOSIS — R262 Difficulty in walking, not elsewhere classified: Secondary | ICD-10-CM | POA: Diagnosis not present

## 2019-10-24 DIAGNOSIS — I10 Essential (primary) hypertension: Secondary | ICD-10-CM | POA: Diagnosis not present

## 2019-10-24 DIAGNOSIS — R0789 Other chest pain: Secondary | ICD-10-CM | POA: Diagnosis not present

## 2019-10-24 DIAGNOSIS — I2582 Chronic total occlusion of coronary artery: Secondary | ICD-10-CM | POA: Diagnosis not present

## 2019-10-24 DIAGNOSIS — Z955 Presence of coronary angioplasty implant and graft: Secondary | ICD-10-CM | POA: Diagnosis not present

## 2019-10-24 DIAGNOSIS — I2510 Atherosclerotic heart disease of native coronary artery without angina pectoris: Secondary | ICD-10-CM | POA: Diagnosis not present

## 2019-10-24 DIAGNOSIS — M545 Low back pain: Secondary | ICD-10-CM | POA: Diagnosis not present

## 2019-10-24 DIAGNOSIS — Z789 Other specified health status: Secondary | ICD-10-CM | POA: Diagnosis not present

## 2019-10-28 ENCOUNTER — Ambulatory Visit (INDEPENDENT_AMBULATORY_CARE_PROVIDER_SITE_OTHER): Payer: Medicare Other | Admitting: Rehabilitative and Restorative Service Providers"

## 2019-10-28 ENCOUNTER — Other Ambulatory Visit: Payer: Self-pay

## 2019-10-28 ENCOUNTER — Other Ambulatory Visit: Payer: Self-pay | Admitting: Sports Medicine

## 2019-10-28 DIAGNOSIS — R29898 Other symptoms and signs involving the musculoskeletal system: Secondary | ICD-10-CM | POA: Diagnosis not present

## 2019-10-28 DIAGNOSIS — M6281 Muscle weakness (generalized): Secondary | ICD-10-CM

## 2019-10-28 DIAGNOSIS — G8929 Other chronic pain: Secondary | ICD-10-CM

## 2019-10-28 DIAGNOSIS — M545 Low back pain, unspecified: Secondary | ICD-10-CM

## 2019-10-28 DIAGNOSIS — M47815 Spondylosis without myelopathy or radiculopathy, thoracolumbar region: Secondary | ICD-10-CM

## 2019-10-28 DIAGNOSIS — R293 Abnormal posture: Secondary | ICD-10-CM

## 2019-10-28 NOTE — Patient Instructions (Signed)
Access Code: [email or web address] URL: [email or web address] Date: 10/28/2019 Prepared by: Rudell Cobb  Exercises Supine Thoracic Mobilization Towel Roll Vertical with Arm Stretch - 2 x daily - 7 x weekly - 1 sets - 1 reps - 60-120 seconds hold Sidelying Thoracic Rotation with Open Book - 2 x daily - 7 x weekly - 1 sets - 5-10 reps Seated Hamstring Stretch - 2 x daily - 7 x weekly - 1 sets - 3 reps - 30 seconds hold

## 2019-10-28 NOTE — Therapy (Signed)
[facility] [place] [address] [place], [place], [address] Phone: [phone]   Fax:  [phone]  Physical Therapy Evaluation  Patient Details  Name: [name] [name] MRN: [id number] Date of Birth: 11/22/1940 Referring Provider (PT): Rodney Langton, MD   Encounter Date: 10/28/2019   PT End of Session - 10/28/19 1611    Visit Number 1    Number of Visits 12    Date for PT Re-Evaluation 12/09/19    Authorization Type medicare/ [employer]    PT Start Time 1320    PT Stop Time 1403    PT Time Calculation (min) 43 min    Activity Tolerance Patient tolerated treatment well    Behavior During Therapy [facility] for tasks assessed/performed           Past Medical History:  Diagnosis Date  . Acute deep vein thrombosis (DVT) of tibial vein of right lower extremity (HCC) 09/29/2014  . Arthritis    back  . Asthma    uses Advair bid and asmanex prn  . Cancer (HCC)    multiple myeloma  . Chronic back pain    buldging disc and scoliosis  . Chronic bronchitis (HCC)    couple of yrs ago  . Complication of anesthesia    severe case of hiccups after hernia surgery;required medication  . Diverticulosis   . Dizziness    related to neck issues  . Emphysema   . Enlarged prostate    takes Flomax daily  . GERD (gastroesophageal reflux disease)    takes PRotonix daily  . History of colon polyps   . Hyperlipidemia    takes Zocor daily  . Hypertension    takes HCTZ,Diovan daily and Isosorbide daily  . Itching    down left arm and leg  . Joint pain   . Macular degeneration    mild,beginning  . Multiple myeloma (HCC) 08/13/2014  . Myocardial infarction (HCC) 2010  . Neck pain   . Pain in limb 06/20/2012  . Pneumonia    hx of---12+yrs ago    Past Surgical History:  Procedure Laterality Date  . ANTERIOR CERVICAL DECOMP/DISCECTOMY FUSION  01/31/2012   Procedure: ANTERIOR CERVICAL DECOMPRESSION/DISCECTOMY FUSION 1 LEVEL;  Surgeon: Karn Cassis, MD;  Location: [facility];  Service: Neurosurgery;  Laterality: N/A;  Cervical Five-Six  Anterior Cervical Decompression /Diskectomy /Fusion/Plate  . BONE MARROW TRANSPLANT    . CARDIAC CATHETERIZATION  2010/2013   06/07/11 [facility]) LAD 25%, D2 75% (small), pLCx 100%, RCA 25%--medical managment   . COLONOSCOPY    . HERNIA REPAIR  2006   x 3   . salivary gland removed     left   . skin cancer removed from left side of face     basal cell carcinoma  . vein removed from left leg      There were no vitals filed for this visit.    Subjective Assessment - 10/28/19 1523    Subjective The patient reports after 2nd Covid shot late January 2021 he developed swelling in L axilla and chest into lymph nodes.  He reports this is still present, but not primary concern today.  He now has R costochrondritis pain at lower 1/2 of sternum.  He also has arthritis in his spine causing mid back pain.  The patinet is taking prednisone and oxycodone for rib and back pain.    Pertinent History Multiple myeloma in remission, transplant 2015 (bone marrow), he has had bone scan/petscan/mammogram/MRI to rule  out cancer;    Patient Stated Goals elimination of pain (when he moves and gets up out of bed)    Currently in Pain? Yes    Pain Score 7     Pain Location Back    Pain Orientation Mid    Pain Descriptors / Indicators Dull;Aching;Sore   "deep" pain   Pain Type Chronic pain    Pain Onset More than a month ago    Pain Frequency Intermittent    Aggravating Factors  worse in middle of the night    Pain Relieving Factors pain meds, moving positions    Effect of Pain on Daily Activities costochondritis pain has improved recently; back pain no changes              [facility] PT Assessment - 10/28/19 1535      Assessment   Medical Diagnosis chest wall pain, mid back pain    Referring Provider (PT) Aundria Mems, MD    Onset Date/Surgical Date 03/17/19    Hand Dominance Right    Prior Therapy one  visit at [facility] but did not return due to limited schedule      Precautions   Precautions Other (comment)    Precaution Comments h/o multiple myeloma; caution re: bone density (no joint mobs)      Restrictions   Weight Bearing Restrictions No      Balance Screen   Has the patient fallen in the past 6 months No    Has the patient had a decrease in activity level because of a fear of falling?  No    Is the patient reluctant to leave their home because of a fear of falling?  No      Home Ecologist residence    Living Arrangements Spouse/significant other      Prior Function   Level of Independence Independent      Observation/Other Assessments   Focus on Therapeutic Outcomes (FOTO)  57% limitation      Sensation   Light Touch Impaired Detail    Light Touch Impaired Details Impaired LLE;Impaired RLE;Impaired LUE;Impaired RUE    Additional Comments tingling bilat legs, hands/arms at night; L foot neuropathy more pronounced (takes gabapentin)      ROM / Strength   AROM / PROM / Strength AROM;Strength      AROM   Overall AROM  Deficits    AROM Assessment Site Cervical;Lumbar    Cervical - Right Rotation 45    Cervical - Left Rotation 45    Lumbar Flexion 60% limitation   bends at knees and gets low back pain   Lumbar Extension 50% limitation   "feels good"   Lumbar - Right Side Bend 50% limitation   pain on L QL   Lumbar - Left Side Bend 50% limitation    Lumbar - Right Rotation 50% limitation    Lumbar - Left Rotation 50% limitation      Strength   Overall Strength Deficits    Overall Strength Comments bilatral shoulder flexion/abduction 5/5, bilateral elbow flexion 5/5, bilateral elbow extension 5/5.      Strength Assessment Site Hip;Knee;Ankle    Right/Left Hip Right;Left    Right Hip Flexion 4/5    Left Hip Flexion 3+/5    Right/Left Knee Right;Left    Right Knee Flexion 5/5    Right Knee Extension 5/5    Left Knee Flexion 4-/5     Left Knee Extension 4+/5    Right/Left  Ankle Right;Left    Right Ankle Dorsiflexion 5/5    Left Ankle Dorsiflexion 5/5      Flexibility   Soft Tissue Assessment /Muscle Length yes    Hamstrings -45 degrees from neutral bilaterally      Palpation   Spinal mobility hypomobile with gentle pressure along spine    Palpation comment tender to palpation bilateral QL, R sternocostal joint, along thoracic spine and lumbar spine in paraspinal musculature                      Objective measurements completed on examination: See above findings.       [facility] Adult PT Treatment/Exercise - 10/28/19 1620      Exercises   Exercises Lumbar      Lumbar Exercises: Stretches   Active Hamstring Stretch Right;Left;1 rep;30 seconds      Lumbar Exercises: Sidelying   Other Sidelying Lumbar Exercises thoracic opening for chest + thoracic mobilization to tolerance      Lumbar Exercises: Prone   Other Prone Lumbar Exercises prone UE reaching, prone lateral rocking *gets LBP after 2-3 minutes prone on elbows    Other Prone Lumbar Exercises prone shoulder extension with scapular retraction                  PT Education - 10/28/19 1609    Education Details HEP provided + reviewed ther ex of standing hip/lumbar extension provided at recent PT eval    Person(s) Educated Patient    Methods Explanation;Demonstration;Handout    Comprehension Returned demonstration;Verbalized understanding               PT Long Term Goals - 10/28/19 1611      PT LONG TERM GOAL #1   Title The patient will be indep with HEP for posture, LE strength, general conditioning and flexibility.    Time 6    Period Weeks    Target Date 12/09/19      PT LONG TERM GOAL #2   Title The patient will improve FOTO limitation from 57% limitation to 47% limitation.    Time 6    Period Weeks    Target Date 12/09/19      PT LONG TERM GOAL #3   Title The patient will improve bilateral hip flexor strength to  5/5.    Time 6    Period Weeks    Target Date 12/09/19      PT LONG TERM GOAL #4   Title The patient will report pain < or equal to 4/10 in mid and low back.    Baseline 7/10 at rest    Time 6    Period Weeks    Target Date 12/09/19      PT LONG TERM GOAL #5   Title The patient will improve AROM spine to reach to shins without knee flexion.    Time 6    Period Weeks    Target Date 12/09/19                  Plan - 10/28/19 1614    Clinical Impression Statement The patient is a 79 yo male presenting to OP rehab with h/o R chest wall pain/ costochondritis and h/o mid and low back pain.  He presents with abnormal posture with thoracic kyphosis, significantly limited in hamstring flexibility, tightness in anterior chest musculature, neural tightness UEs, LE weakness, core weakness limiting comfort for sleeping and ADLs.  PT to address deficits to optimize functional  status.    Personal Factors and Comorbidities Comorbidity 2    Comorbidities HTN, multiple myeloma    Examination-Activity Limitations Lift;Locomotion Level;Reach Overhead;Sleep;Squat;Stairs;Stand    Examination-Participation Restrictions Cleaning;Community Activity    Stability/Clinical Decision Making Stable/Uncomplicated    Clinical Decision Making Low    Rehab Potential Good    PT Frequency 2x / week    PT Duration 6 weeks    PT Treatment/Interventions ADLs/Self Care Home Management;Gait training;Stair training;Functional mobility training;Therapeutic activities;Therapeutic exercise;Balance training;Neuromuscular re-education;Patient/family education;Taping;Manual techniques;Moist Heat;Electrical Stimulation;Iontophoresis 4mg /ml Dexamethasone;Aquatic Therapy    PT Next Visit Plan check HEP, add hip strengthening, work on LE and lumbar flexibility, postural strengthening, general ROM, UE nerve gliding    PT Home Exercise Plan Access Code: [email or web address]    Consulted and Agree with Plan of Care Patient            Patient will benefit from skilled therapeutic intervention in order to improve the following deficits and impairments:  Decreased range of motion, Pain, Hypomobility, Postural dysfunction, Impaired flexibility, Decreased strength, Decreased mobility, Increased fascial restricitons, Decreased activity tolerance  Visit Diagnosis: Chronic bilateral low back pain, unspecified whether sciatica present  Muscle weakness (generalized)  Abnormal posture  Other symptoms and signs involving the musculoskeletal system     Problem List Patient Active Problem List   Diagnosis Date Noted  . Pain of left breast 04/30/2019  . Ingrown toenail of right foot 10/08/2018  . Hypothyroidism 01/08/2018  . Ganglion cyst of left foot 10/19/2017  . Slurring of speech 10/06/2017  . Bronchiectasis without complication ([place]) 05/39/7673  . Chest wall pain 12/07/2016  . Dyspnea 04/19/2016  . Renal cyst 04/05/2016  . Lytic bone lesions on xray 12/11/2015  . Other specified abnormal immunological findings in serum 05/11/2015  . Primary osteoarthritis of both knees 05/05/2015  . Long QT interval 04/01/2015  . Encounter for examination for normal comparison or control in clinical research program 03/24/2015  . History of organ or tissue transplant 03/18/2015  . Spondylosis of thoracolumbar region without myelopathy or radiculopathy 12/24/2014  . Solitary pulmonary nodule 12/11/2014  . Peripheral neuropathy 08/15/2014  . Insomnia 08/15/2014  . Multiple myeloma ([place]) 08/13/2014  . Primary osteoarthritis of left shoulder 06/18/2014  . Presence of coronary angioplasty implant and graft 05/16/2014  . Obstructive sleep apnea 12/27/2013  . GERD (gastroesophageal reflux disease) 12/27/2013  . Diabetes mellitus, type 2 ([place]) 11/12/2013  . Pulmonary emphysema ([place]) 11/12/2013  . CAD S/P percutaneous coronary angioplasty 05/10/2013  . Bundle branch block, right 05/08/2013  . Hemorrhoids 03/19/2013  .  Degenerative disc disease, cervical 02/19/2013  . Essential hypertension, benign 02/19/2013  . History of pulmonary embolism 02/19/2013  . Hyperlipidemia 02/19/2013  . Right bundle branch block 02/19/2013  . Asthma with COPD ([place]) 02/19/2013  . Pre-operative cardiovascular examination 02/19/2013  . Iron deficiency anemia 02/19/2013  . Pulmonary embolism ([place]) 11/01/2012  . Benign prostatic hyperplasia with urinary obstruction 10/18/2012    Lorenz Donley, PT 10/28/2019, 4:21 PM  [facility] [place], [place], [address] Phone: [phone]   Fax:  [phone]  Name: Yogi Arther MRN: [id number] Date of Birth: 23-Feb-1940

## 2019-10-30 ENCOUNTER — Ambulatory Visit: Payer: Medicare Other

## 2019-10-30 ENCOUNTER — Inpatient Hospital Stay: Payer: Medicare Other

## 2019-10-30 ENCOUNTER — Other Ambulatory Visit: Payer: Self-pay

## 2019-10-30 ENCOUNTER — Inpatient Hospital Stay (HOSPITAL_BASED_OUTPATIENT_CLINIC_OR_DEPARTMENT_OTHER): Payer: Medicare Other | Admitting: Hematology & Oncology

## 2019-10-30 ENCOUNTER — Encounter: Payer: Self-pay | Admitting: Hematology & Oncology

## 2019-10-30 ENCOUNTER — Inpatient Hospital Stay: Payer: Medicare Other | Attending: Hematology & Oncology

## 2019-10-30 VITALS — BP 131/60 | HR 50 | Temp 97.6°F | Resp 17 | Wt 197.2 lb

## 2019-10-30 DIAGNOSIS — C9000 Multiple myeloma not having achieved remission: Secondary | ICD-10-CM

## 2019-10-30 DIAGNOSIS — C9002 Multiple myeloma in relapse: Secondary | ICD-10-CM | POA: Diagnosis not present

## 2019-10-30 DIAGNOSIS — C9001 Multiple myeloma in remission: Secondary | ICD-10-CM | POA: Insufficient documentation

## 2019-10-30 DIAGNOSIS — M899 Disorder of bone, unspecified: Secondary | ICD-10-CM

## 2019-10-30 DIAGNOSIS — I2510 Atherosclerotic heart disease of native coronary artery without angina pectoris: Secondary | ICD-10-CM | POA: Diagnosis not present

## 2019-10-30 DIAGNOSIS — Z9861 Coronary angioplasty status: Secondary | ICD-10-CM | POA: Diagnosis not present

## 2019-10-30 LAB — CMP (CANCER CENTER ONLY)
ALT: 139 U/L — ABNORMAL HIGH (ref 0–44)
AST: 73 U/L — ABNORMAL HIGH (ref 15–41)
Albumin: 4.2 g/dL (ref 3.5–5.0)
Alkaline Phosphatase: 93 U/L (ref 38–126)
Anion gap: 9 (ref 5–15)
BUN: 20 mg/dL (ref 8–23)
CO2: 31 mmol/L (ref 22–32)
Calcium: 9.7 mg/dL (ref 8.9–10.3)
Chloride: 101 mmol/L (ref 98–111)
Creatinine: 1.11 mg/dL (ref 0.61–1.24)
GFR, Est AFR Am: >60 mL/min (ref >60)
GFR, Estimated: >60 mL/min (ref >60)
Glucose, Bld: 101 mg/dL — ABNORMAL HIGH (ref 70–99)
Potassium: 3.2 mmol/L — ABNORMAL LOW (ref 3.5–5.1)
Sodium: 141 mmol/L (ref 135–145)
Total Bilirubin: 0.7 mg/dL (ref 0.3–1.2)
Total Protein: 6.5 g/dL (ref 6.5–8.1)

## 2019-10-30 LAB — CBC WITH DIFFERENTIAL (CANCER CENTER ONLY)
Abs Immature Granulocytes: 0.02 10*3/uL (ref 0.00–0.07)
Basophils Absolute: 0 10*3/uL (ref 0.0–0.1)
Basophils Relative: 0 %
Eosinophils Absolute: 0.1 10*3/uL (ref 0.0–0.5)
Eosinophils Relative: 1 %
HCT: 42.5 % (ref 39.0–52.0)
Hemoglobin: 14.4 g/dL (ref 13.0–17.0)
Immature Granulocytes: 0 %
Lymphocytes Relative: 38 %
Lymphs Abs: 3.4 10*3/uL (ref 0.7–4.0)
MCH: 30.1 pg (ref 26.0–34.0)
MCHC: 33.9 g/dL (ref 30.0–36.0)
MCV: 88.7 fL (ref 80.0–100.0)
Monocytes Absolute: 0.7 10*3/uL (ref 0.1–1.0)
Monocytes Relative: 8 %
Neutro Abs: 4.8 10*3/uL (ref 1.7–7.7)
Neutrophils Relative %: 53 %
Platelet Count: 176 10*3/uL (ref 150–400)
RBC: 4.79 MIL/uL (ref 4.22–5.81)
RDW: 12.8 % (ref 11.5–15.5)
WBC Count: 9 10*3/uL (ref 4.0–10.5)
nRBC: 0 % (ref 0.0–0.2)

## 2019-10-30 MED ORDER — SODIUM CHLORIDE 0.9 % IV SOLN
INTRAVENOUS | Status: DC
  Filled 2019-10-30 (×2): qty 250

## 2019-10-30 MED ORDER — ZOLEDRONIC ACID 4 MG/100ML IV SOLN
4.0000 mg | Freq: Once | INTRAVENOUS | Status: AC
  Administered 2019-10-30: 4 mg via INTRAVENOUS
  Filled 2019-10-30: qty 100

## 2019-10-30 NOTE — Progress Notes (Signed)
Hematology and Oncology Follow Up Visit  Audiel Scheiber [id number] 1940/11/11 79 y.o. 10/30/2019   Principle Diagnosis:  IgG kappa myeloma - Trisomy 11 by FISH Acute thromboembolism of the right lower leg  Past Therapy: S/P ASCT at [facility] on 04/02/2015 Patient s/p cycle 5 of Velcade/Revlimid/Decadron Revlimid 10mg  po q day (21/28) -- d/c on 09/15/2017  Current Therapy:        Zometa 4 mg IV every3 months - next dose due 01/2020 Xarelto 10 mg by mouth daily   Interim History:  Mr. Kai is here today for follow-up.  It looks like the substernal chest pain that he was having is costochondritis.  He currently is on prednisone for this.  We are noticing that his kappa light chain is slowly going up.  Over the past for 5 months, the light chain has been steadily increasing.  We last checked in August, the the level was 3.4 mg/dL.  I am going to give Mr. Girtman a 24-hour urine collection so we can see if there is any monoclonal light chain in his urine.  The last 24-hour urine that we have on him was back in 2019.  At that time there is no light chain in his urine.  His PET scan that was done recently did not show any active bone disease.  He has had no problems with nausea or vomiting.  He has lost weight.  He wants to lose weight to help with his pain.  He really has done a good job with this.  He has had no bleeding.  He has had no leg swelling.  He has had no headache.  He is trying to have his son who lives up in [place] come down to visit.  We did write a letter to try to get his son into our [place].  Unfortunately, this has not yet happened.  Overall, his performance status is ECOG 1.  Medications:  Allergies as of 10/30/2019      Reactions   Budesonide-formoterol Fumarate Itching, Other (See Comments)   Codeine Itching   Hydrocodone-acetaminophen Itching   Mushroom Extract Complex Nausea And Vomiting, Other (See Comments)   Only shitake mushroom  causes this  reaction. Flu-like symptoms from portabello mushrooms      Medication List       Accurate as of October 30, 2019  1:37 PM. If you have any questions, ask your nurse or doctor.        amLODipine 5 MG tablet Commonly known as: NORVASC TAKE 1 TABLET DAILY   aspirin EC 81 MG tablet Take by mouth.   carvedilol 6.25 MG tablet Commonly known as: COREG TAKE 1 TABLET TWICE DAILY  WITH MEALS   cetirizine 10 MG tablet Commonly known as: EQ Allergy Relief (Cetirizine) Take 1 tablet (10 mg total) by mouth daily.   esomeprazole 40 MG capsule Commonly known as: NEXIUM TAKE 1 CAPSULE DAILY AT 12 NOON   ezetimibe 10 MG tablet Commonly known as: ZETIA Take 1 tablet (10 mg total) by mouth daily.   famciclovir 500 MG tablet Commonly known as: FAMVIR TAKE 1 TABLET DAILY   fluticasone 50 MCG/ACT nasal spray Commonly known as: FLONASE Place 2 sprays into both nostrils daily.   gabapentin 600 MG tablet Commonly known as: NEURONTIN TAKE 1 TABLET EVERY MORNINGAND 2 TABLETS EVERY NIGHT   hydrochlorothiazide 12.5 MG capsule Commonly known as: MICROZIDE TAKE 1 CAPSULE DAILY   isosorbide mononitrate 30 MG 24 hr tablet Commonly known as: IMDUR TAKE 1  TABLET TWICE A DAY   Klor-Con M20 20 MEQ tablet Generic drug: potassium chloride SA TAKE 1 TABLET TWICE A DAY   levothyroxine 100 MCG tablet Commonly known as: Synthroid Take 1 tablet (100 mcg total) by mouth daily before breakfast.   LORazepam 1 MG tablet Commonly known as: ATIVAN Take 1 tablet (1 mg total) by mouth at bedtime.   nitroGLYCERIN 0.4 MG SL tablet Commonly known as: NITROSTAT Place 1 tablet (0.4 mg total) under the tongue every 5 (five) minutes as needed. For chest pain   oxyCODONE-acetaminophen 10-325 MG tablet Commonly known as: PERCOCET Take 1 tablet by mouth every 8 (eight) hours as needed.   predniSONE 50 MG tablet Commonly known as: DELTASONE One tab PO daily for 5 days.   pyridoxine 100 MG  tablet Commonly known as: B-6 Take 100 mg by mouth daily.   RA Vitamin B-12 TR 1000 MCG Tbcr Generic drug: Cyanocobalamin Take 1 tablet by mouth daily.   rivaroxaban 10 MG Tabs tablet Commonly known as: XARELTO Take 1 tablet (10 mg total) by mouth daily with supper.   tamsulosin 0.4 MG Caps capsule Commonly known as: FLOMAX TAKE 1 CAPSULE DAILY AFTER BREAKFAST       Allergies:  Allergies  Allergen Reactions  . Budesonide-Formoterol Fumarate Itching and Other (See Comments)  . Codeine Itching  . Hydrocodone-Acetaminophen Itching  . Mushroom Extract Complex Nausea And Vomiting and Other (See Comments)    Only shitake mushroom  causes this reaction. Flu-like symptoms from portabello mushrooms    Past Medical History, Surgical history, Social history, and Family History were reviewed and updated.  Review of Systems: Review of Systems  HENT: Negative.   Eyes: Negative.   Respiratory: Negative.   Cardiovascular: Positive for chest pain.  Gastrointestinal: Negative.   Genitourinary: Negative.   Musculoskeletal: Positive for back pain.  Skin: Negative.   Neurological: Negative.   Endo/Heme/Allergies: Negative.   Psychiatric/Behavioral: Negative.      Physical Exam:  weight is 197 lb 4 oz (89.5 kg). His oral temperature is 97.6 F (36.4 C). His blood pressure is 131/60 and his pulse is 50 (abnormal). His respiration is 17 and oxygen saturation is 96%.   Wt Readings from Last 3 Encounters:  10/30/19 197 lb 4 oz (89.5 kg)  10/23/19 199 lb (90.3 kg)  09/26/19 205 lb 6 oz (93.2 kg)    Physical Exam Vitals reviewed.  HENT:     Head: Normocephalic and atraumatic.  Eyes:     Pupils: Pupils are equal, round, and reactive to light.  Cardiovascular:     Rate and Rhythm: Normal rate and regular rhythm.     Heart sounds: Normal heart sounds.  Pulmonary:     Effort: Pulmonary effort is normal.     Breath sounds: Normal breath sounds.  Abdominal:     General: Bowel  sounds are normal.     Palpations: Abdomen is soft.  Musculoskeletal:        General: No tenderness or deformity. Normal range of motion.     Cervical back: Normal range of motion.  Lymphadenopathy:     Cervical: No cervical adenopathy.  Skin:    General: Skin is warm and dry.     Findings: No erythema or rash.  Neurological:     Mental Status: He is alert and oriented to person, place, and time.  Psychiatric:        Behavior: Behavior normal.        Thought Content: Thought content normal.  Judgment: Judgment normal.      Lab Results  Component Value Date   WBC 9.0 10/30/2019   HGB 14.4 10/30/2019   HCT 42.5 10/30/2019   MCV 88.7 10/30/2019   PLT 176 10/30/2019   Lab Results  Component Value Date   FERRITIN 199 07/18/2019   IRON 65 07/18/2019   TIBC 316 07/18/2019   UIBC 251 07/18/2019   IRONPCTSAT 21 07/18/2019   Lab Results  Component Value Date   RETICCTPCT 1.7 07/18/2019   RBC 4.79 10/30/2019   Lab Results  Component Value Date   KPAFRELGTCHN 33.9 (H) 09/25/2019   LAMBDASER 13.2 09/25/2019   KAPLAMBRATIO 2.57 (H) 09/25/2019   Lab Results  Component Value Date   IGGSERUM 873 09/25/2019   IGA 70 09/25/2019   IGMSERUM 39 09/25/2019   Lab Results  Component Value Date   TOTALPROTELP 6.6 09/25/2019   ALBUMINELP 3.9 09/25/2019   A1GS 0.2 09/25/2019   A2GS 0.7 09/25/2019   BETS 0.9 09/25/2019   BETA2SER 0.3 02/11/2015   GAMS 0.8 09/25/2019   MSPIKE Not Observed 09/25/2019   SPEI Comment 01/17/2019     Chemistry      Component Value Date/Time   NA 141 10/30/2019 1152   NA 145 (H) 06/28/2019 1501   NA 140 01/27/2017 1044   NA 141 06/05/2015 1036   K 3.2 (L) 10/30/2019 1152   K 3.3 01/27/2017 1044   K 2.9 (LL) 06/05/2015 1036   CL 101 10/30/2019 1152   CL 101 01/27/2017 1044   CO2 31 10/30/2019 1152   CO2 28 01/27/2017 1044   CO2 27 06/05/2015 1036   BUN 20 10/30/2019 1152   BUN 15 06/28/2019 1501   BUN 14 01/27/2017 1044   BUN 13.1  06/05/2015 1036   CREATININE 1.11 10/30/2019 1152   CREATININE 1.16 10/08/2018 1531   CREATININE 1.0 06/05/2015 1036   GLU 152 03/30/2016 0000      Component Value Date/Time   CALCIUM 9.7 10/30/2019 1152   CALCIUM 8.9 01/27/2017 1044   CALCIUM 9.4 06/05/2015 1036   ALKPHOS 93 10/30/2019 1152   ALKPHOS 126 (H) 01/27/2017 1044   ALKPHOS 94 06/05/2015 1036   AST 73 (H) 10/30/2019 1152   AST 22 06/05/2015 1036   ALT 139 (H) 10/30/2019 1152   ALT 33 01/27/2017 1044   ALT 18 06/05/2015 1036   BILITOT 0.7 10/30/2019 1152   BILITOT 0.59 06/05/2015 1036       Impression and Plan: Mr. Mckim is a very pleasant 79 yo caucasian gentleman with history of IgG kappa myeloma.  He underwent standard induction chemotherapy and then ultimately stem cell transplant at [facility] in February 2017.   I do actually worry about him having a relapse of the myeloma.  It is been 4-1/2 years since transplant.  We will and see what the 24-hour urine shows.  If he has a lot of Kappa light chain his urine, we might be forced to do treatment before he starts to have any kind of renal insufficiency.  We also may have to consider a bone marrow biopsy on him so we can get a more accurate and current state of the myeloma in the bone marrow and also an update on his cytogenetics.  He will get his Zometa today.  I would like to have him come back in another month or so. His myeloma studies have remained stable. He is still having a lot of musculoskeletal and bone pain so we will get  a bone survey to rule out lytic lesions.  We will plan to see him back in another month. He is due for Zometa again at that time.  He will contact our office with any questions or concerns.    [name], MD 9/15/20211:37 PM

## 2019-10-30 NOTE — Patient Instructions (Signed)
Zoledronic Acid injection (Hypercalcemia, Oncology) What is this medicine? ZOLEDRONIC ACID (ZOE le dron ik AS id) lowers the amount of calcium loss from bone. It is used to treat too much calcium in your blood from cancer. It is also used to prevent complications of cancer that has spread to the bone. This medicine may be used for other purposes; ask your health care provider or pharmacist if you have questions. COMMON BRAND NAME(S): Zometa What should I tell my health care provider before I take this medicine? They need to know if you have any of these conditions:  aspirin-sensitive asthma  cancer, especially if you are receiving medicines used to treat cancer  dental disease or wear dentures  infection  kidney disease  receiving corticosteroids like dexamethasone or prednisone  an unusual or allergic reaction to zoledronic acid, other medicines, foods, dyes, or preservatives  pregnant or trying to get pregnant  breast-feeding How should I use this medicine? This medicine is for infusion into a vein. It is given by a health care professional in a hospital or clinic setting. Talk to your pediatrician regarding the use of this medicine in children. Special care may be needed. Overdosage: If you think you have taken too much of this medicine contact a poison control center or emergency room at once. NOTE: This medicine is only for you. Do not share this medicine with others. What if I miss a dose? It is important not to miss your dose. Call your doctor or health care professional if you are unable to keep an appointment. What may interact with this medicine?  certain antibiotics given by injection  NSAIDs, medicines for pain and inflammation, like ibuprofen or naproxen  some diuretics like bumetanide, furosemide  teriparatide  thalidomide This list may not describe all possible interactions. Give your health care provider a list of all the medicines, herbs, non-prescription  drugs, or dietary supplements you use. Also tell them if you smoke, drink alcohol, or use illegal drugs. Some items may interact with your medicine. What should I watch for while using this medicine? Visit your doctor or health care professional for regular checkups. It may be some time before you see the benefit from this medicine. Do not stop taking your medicine unless your doctor tells you to. Your doctor may order blood tests or other tests to see how you are doing. Women should inform their doctor if they wish to become pregnant or think they might be pregnant. There is a potential for serious side effects to an unborn child. Talk to your health care professional or pharmacist for more information. You should make sure that you get enough calcium and vitamin D while you are taking this medicine. Discuss the foods you eat and the vitamins you take with your health care professional. Some people who take this medicine have severe bone, joint, and/or muscle pain. This medicine may also increase your risk for jaw problems or a broken thigh bone. Tell your doctor right away if you have severe pain in your jaw, bones, joints, or muscles. Tell your doctor if you have any pain that does not go away or that gets worse. Tell your dentist and dental surgeon that you are taking this medicine. You should not have major dental surgery while on this medicine. See your dentist to have a dental exam and fix any dental problems before starting this medicine. Take good care of your teeth while on this medicine. Make sure you see your dentist for regular follow-up   appointments. What side effects may I notice from receiving this medicine? Side effects that you should report to your doctor or health care professional as soon as possible:  allergic reactions like skin rash, itching or hives, swelling of the face, lips, or tongue  anxiety, confusion, or depression  breathing problems  changes in vision  eye  pain  feeling faint or lightheaded, falls  jaw pain, especially after dental work  mouth sores  muscle cramps, stiffness, or weakness  redness, blistering, peeling or loosening of the skin, including inside the mouth  trouble passing urine or change in the amount of urine Side effects that usually do not require medical attention (report to your doctor or health care professional if they continue or are bothersome):  bone, joint, or muscle pain  constipation  diarrhea  fever  hair loss  irritation at site where injected  loss of appetite  nausea, vomiting  stomach upset  trouble sleeping  trouble swallowing  weak or tired This list may not describe all possible side effects. Call your doctor for medical advice about side effects. You may report side effects to FDA at 1-800-FDA-1088. Where should I keep my medicine? This drug is given in a hospital or clinic and will not be stored at home. NOTE: This sheet is a summary. It may not cover all possible information. If you have questions about this medicine, talk to your doctor, pharmacist, or health care provider.  2020 Elsevier/Gold Standard (2013-06-29 14:19:39)  

## 2019-10-30 NOTE — Progress Notes (Signed)
Prior authorization okay for treatment today per Otilio Carpen, Financial Advocate.

## 2019-10-31 ENCOUNTER — Encounter: Payer: Self-pay | Admitting: Physical Therapy

## 2019-10-31 ENCOUNTER — Ambulatory Visit (INDEPENDENT_AMBULATORY_CARE_PROVIDER_SITE_OTHER): Payer: Medicare Other | Admitting: Physical Therapy

## 2019-10-31 DIAGNOSIS — G8929 Other chronic pain: Secondary | ICD-10-CM

## 2019-10-31 DIAGNOSIS — R29898 Other symptoms and signs involving the musculoskeletal system: Secondary | ICD-10-CM | POA: Diagnosis not present

## 2019-10-31 DIAGNOSIS — R293 Abnormal posture: Secondary | ICD-10-CM | POA: Diagnosis not present

## 2019-10-31 DIAGNOSIS — M545 Low back pain, unspecified: Secondary | ICD-10-CM

## 2019-10-31 DIAGNOSIS — M6281 Muscle weakness (generalized): Secondary | ICD-10-CM

## 2019-10-31 LAB — PROTEIN ELECTROPHORESIS, SERUM
A/G Ratio: 1.4 (ref 0.7–1.7)
Albumin ELP: 3.7 g/dL (ref 2.9–4.4)
Alpha-1-Globulin: 0.2 g/dL (ref 0.0–0.4)
Alpha-2-Globulin: 0.7 g/dL (ref 0.4–1.0)
Beta Globulin: 0.9 g/dL (ref 0.7–1.3)
Gamma Globulin: 0.9 g/dL (ref 0.4–1.8)
Globulin, Total: 2.7 g/dL (ref 2.2–3.9)
Total Protein ELP: 6.4 g/dL (ref 6.0–8.5)

## 2019-10-31 LAB — IGG, IGA, IGM
IgA: 58 mg/dL — ABNORMAL LOW (ref 61–437)
IgG (Immunoglobin G), Serum: 892 mg/dL (ref 603–1613)
IgM (Immunoglobulin M), Srm: 45 mg/dL (ref 15–143)

## 2019-10-31 LAB — LACTATE DEHYDROGENASE: LDH: 160 U/L (ref 98–192)

## 2019-10-31 LAB — KAPPA/LAMBDA LIGHT CHAINS
Kappa free light chain: 32.3 mg/L — ABNORMAL HIGH (ref 3.3–19.4)
Kappa, lambda light chain ratio: 7.34 — ABNORMAL HIGH (ref 0.26–1.65)
Lambda free light chains: 4.4 mg/L — ABNORMAL LOW (ref 5.7–26.3)

## 2019-10-31 NOTE — Patient Instructions (Addendum)
Kinesiology tape What is kinesiology tape?  There are many brands of kinesiology tape.  KTape, [employer], [employer], Dynamic tape, to name a few. It is an elasticized tape designed to support the body's natural healing process. This tape provides stability and support to muscles and joints without restricting motion. It can also help decrease swelling in the area of application. How does it work? The tape microscopically lifts and decompresses the skin to allow for drainage of lymph (swelling) to flow away from area, reducing inflammation.  The tape has the ability to help re-educate the neuromuscular system by targeting specific receptors in the skin.  The presence of the tape increases the body's awareness of posture and body mechanics.  Do not use with: . Open wounds . Skin lesions . Adhesive allergies Safe removal of the tape: In some rare cases, mild/moderate skin irritation can occur.  This can include redness, itchiness, or hives. If this occurs, immediately remove tape and consult your primary care physician if symptoms are severe or do not resolve within 2 days.  To remove tape safely, hold nearby skin with one hand and gentle roll tape down with other hand.  You can apply oil or conditioner to tape while in shower prior to removal to loosen adhesive.  DO NOT swiftly rip tape off like a band-aid, as this could cause skin tears and additional skin irritation.    Access Code: [email or web address]: [email or web address]: 09/16/2021Prepared by: [facility] [place]  Supine Thoracic Mobilization Towel Roll Vertical with Arm Stretch - 2 x daily - 7 x weekly - 1 sets - 1 reps - 60-120 seconds hold  Sidelying Thoracic Rotation with Open Book - 2 x daily - 7 x weekly - 1 sets - 5-10 reps  Seated Hamstring Stretch - 2 x daily - 7 x weekly - 1 sets - 3 reps - 30 seconds hold  Supine Hamstring Stretch with Strap - 2 x daily - 7 x weekly - 1 sets - 2 reps - 30 seconds  hold  Supine Chest Stretch with Elbows Bent - 2 x daily - 7 x weekly - 1 sets - 1-2 reps - 15-30 seconds hold  Supine Bridge - 1 x daily - 7 x weekly - 1 sets - 10 reps  Supine Scapular Retraction - 1 x daily - 7 x weekly - 1 sets - 10 reps - 5 sec hold  Sit to Stand - 1-2 x daily - 7 x weekly - 1 sets - 10 reps

## 2019-10-31 NOTE — Therapy (Signed)
[facility] [place], [place], [address] Phone: [phone]   Fax:  [phone]  Physical Therapy Treatment  Patient Details  Name: [name] [name] MRN: [id number] Date of Birth: 1940-07-31 Referring Provider (PT): Aundria Mems, MD   Encounter Date: 10/31/2019   PT End of Session - 10/31/19 0845    Visit Number 2    Number of Visits 12    Date for PT Re-Evaluation 12/09/19    Authorization Type medicare/ AARP    PT Start Time [phone]    PT Stop Time 0930    PT Time Calculation (min) 43 min    Activity Tolerance Patient tolerated treatment well    Behavior During Therapy [facility] for tasks assessed/performed           Past Medical History:  Diagnosis Date  . Acute deep vein thrombosis (DVT) of tibial vein of right lower extremity ([place]) 09/29/2014  . Arthritis    back  . Asthma    uses Advair bid and asmanex prn  . Cancer ([place])    multiple myeloma  . Chronic back pain    buldging disc and scoliosis  . Chronic bronchitis ([place])    couple of yrs ago  . Complication of anesthesia    severe case of hiccups after hernia surgery;required medication  . Diverticulosis   . Dizziness    related to neck issues  . Emphysema   . Enlarged prostate    takes Flomax daily  . GERD (gastroesophageal reflux disease)    takes PRotonix daily  . History of colon polyps   . Hyperlipidemia    takes Zocor daily  . Hypertension    takes HCTZ,Diovan daily and Isosorbide daily  . Itching    down left arm and leg  . Joint pain   . Macular degeneration    mild,beginning  . Multiple myeloma ([place]) 08/13/2014  . Myocardial infarction ([place]) 2010  . Neck pain   . Pain in limb 06/20/2012  . Pneumonia    hx of---12+yrs ago    Past Surgical History:  Procedure Laterality Date  . ANTERIOR CERVICAL DECOMP/DISCECTOMY FUSION  01/31/2012   Procedure: ANTERIOR CERVICAL DECOMPRESSION/DISCECTOMY FUSION 1 LEVEL;  Surgeon: Floyce Stakes, MD;  Location: [facility];  Service: Neurosurgery;  Laterality: N/A;  Cervical Five-Six  Anterior Cervical Decompression /Diskectomy /Fusion/Plate  . BONE MARROW TRANSPLANT    . CARDIAC CATHETERIZATION  2010/2013   06/07/11 [facility]) LAD 25%, D2 75% (small), pLCx 100%, RCA 25%--medical managment   . COLONOSCOPY    . HERNIA REPAIR  2006   x 3   . salivary gland removed     left   . skin cancer removed from left side of face     basal cell carcinoma  . vein removed from left leg      There were no vitals filed for this visit.   Subjective Assessment - 10/31/19 0854    Subjective Pt reports he is sore from the exercises and the eval, but can tell he is not as stiff.  The stretches feel good while completing them.   Currently in Pain? Yes    Pain Score 7     Pain Location Back    Pain Orientation Lower    Pain Descriptors / Indicators Sore    Aggravating Factors  prolonged standing    Pain Relieving Factors pain meds              [facility] PT  Assessment - 10/31/19 0001      Assessment   Medical Diagnosis chest wall pain, mid back pain    Referring Provider (PT) Aundria Mems, MD    Onset Date/Surgical Date 03/17/19    Hand Dominance Right    Prior Therapy one visit at [facility] but did not return due to limited schedule            [facility] Adult PT Treatment/Exercise - 10/31/19 0001      Lumbar Exercises: Stretches   Passive Hamstring Stretch Right;Left;2 reps;30 seconds   seated with straight back.    Passive Hamstring Stretch Limitations and trial in supine with hands behind leg x 20 sec, and strap (preferred position) x 20 sec each     Lower Trunk Rotation 2 reps;10 seconds   arms in Y   Lower Trunk Rotation Limitations too much pressure in LB; stopped    Other Lumbar Stretch Exercise hooklying:  prolonged stretch with arms abdct 90 deg for chest stretch, then 5 snow angels        Lumbar Exercises: Aerobic   Nustep L5: arms/legs x 6 min       Lumbar  Exercises: Seated   Sit to Stand 10 reps   from slightly elevated table; core engaged.    Sit to Stand Limitations No UE support      Lumbar Exercises: Supine   Bridge 10 reps   cues to breathe during exertion   Other Supine Lumbar Exercises scap retraction/ thoracic lift x 5 sec x 8 reps       Lumbar Exercises: Sidelying   Other Sidelying Lumbar Exercises open book thoracic rotation x 5 reps each side, 5-10 sec hold.       Manual Therapy   Manual Therapy Taping    Manual therapy comments I strip of sensitive skin tape applied to Rt lower pec at sternal attachment - to decompress tissue and decrease pain.                        PT Long Term Goals - 10/28/19 1611      PT LONG TERM GOAL #1   Title The patient will be indep with HEP for posture, LE strength, general conditioning and flexibility.    Time 6    Period Weeks    Target Date 12/09/19      PT LONG TERM GOAL #2   Title The patient will improve FOTO limitation from 57% limitation to 47% limitation.    Time 6    Period Weeks    Target Date 12/09/19      PT LONG TERM GOAL #3   Title The patient will improve bilateral hip flexor strength to 5/5.    Time 6    Period Weeks    Target Date 12/09/19      PT LONG TERM GOAL #4   Title The patient will report pain < or equal to 4/10 in mid and low back.    Baseline 7/10 at rest    Time 6    Period Weeks    Target Date 12/09/19      PT LONG TERM GOAL #5   Title The patient will improve AROM spine to reach to shins without knee flexion.    Time 6    Period Weeks    Target Date 12/09/19                 Plan - 10/31/19 1458  Clinical Impression Statement Pt preferred hooklying Hamstring stretch with strap over seated position.  Open book rotation was preferred and tolerated better than supine lower trunk rotation. All other exercises were tolerated well. Encouraged pt to strive for 3000 steps/day (1000 above his current personal goal) to increase  mobility during day.  Goals are ongoing.    Personal Factors and Comorbidities Comorbidity 2    Comorbidities HTN, multiple myeloma    Examination-Activity Limitations Lift;Locomotion Level;Reach Overhead;Sleep;Squat;Stairs;Stand    Examination-Participation Restrictions Cleaning;Community Activity    Stability/Clinical Decision Making Stable/Uncomplicated    Rehab Potential Good    PT Frequency 2x / week    PT Duration 6 weeks    PT Treatment/Interventions ADLs/Self Care Home Management;Gait training;Stair training;Functional mobility training;Therapeutic activities;Therapeutic exercise;Balance training;Neuromuscular re-education;Patient/family education;Taping;Manual techniques;Moist Heat;Electrical Stimulation;Iontophoresis 4mg /ml Dexamethasone;Aquatic Therapy    PT Next Visit Plan check HEP, add hip strengthening, work on LE and lumbar flexibility, postural strengthening, general ROM, UE nerve gliding    PT Home Exercise Plan Access Code: [email or web address]    Consulted and Agree with Plan of Care Patient           Patient will benefit from skilled therapeutic intervention in order to improve the following deficits and impairments:  Decreased range of motion, Pain, Hypomobility, Postural dysfunction, Impaired flexibility, Decreased strength, Decreased mobility, Increased fascial restricitons, Decreased activity tolerance  Visit Diagnosis: Chronic bilateral low back pain, unspecified whether sciatica present  Muscle weakness (generalized)  Abnormal posture  Other symptoms and signs involving the musculoskeletal system     Problem List Patient Active Problem List   Diagnosis Date Noted  . Pain of left breast 04/30/2019  . Ingrown toenail of right foot 10/08/2018  . Hypothyroidism 01/08/2018  . Ganglion cyst of left foot 10/19/2017  . Slurring of speech 10/06/2017  . Bronchiectasis without complication ([place]) 18/56/3149  . Chest wall pain 12/07/2016  . Dyspnea 04/19/2016  . Renal  cyst 04/05/2016  . Lytic bone lesions on xray 12/11/2015  . Other specified abnormal immunological findings in serum 05/11/2015  . Primary osteoarthritis of both knees 05/05/2015  . Long QT interval 04/01/2015  . Encounter for examination for normal comparison or control in clinical research program 03/24/2015  . History of organ or tissue transplant 03/18/2015  . Spondylosis of thoracolumbar region without myelopathy or radiculopathy 12/24/2014  . Solitary pulmonary nodule 12/11/2014  . Peripheral neuropathy 08/15/2014  . Insomnia 08/15/2014  . Multiple myeloma ([place]) 08/13/2014  . Primary osteoarthritis of left shoulder 06/18/2014  . Presence of coronary angioplasty implant and graft 05/16/2014  . Obstructive sleep apnea 12/27/2013  . GERD (gastroesophageal reflux disease) 12/27/2013  . Diabetes mellitus, type 2 ([place]) 11/12/2013  . Pulmonary emphysema ([place]) 11/12/2013  . CAD S/P percutaneous coronary angioplasty 05/10/2013  . Bundle branch block, right 05/08/2013  . Hemorrhoids 03/19/2013  . Degenerative disc disease, cervical 02/19/2013  . Essential hypertension, benign 02/19/2013  . History of pulmonary embolism 02/19/2013  . Hyperlipidemia 02/19/2013  . Right bundle branch block 02/19/2013  . Asthma with COPD ([place]) 02/19/2013  . Pre-operative cardiovascular examination 02/19/2013  . Iron deficiency anemia 02/19/2013  . Pulmonary embolism ([place]) 11/01/2012  . Benign prostatic hyperplasia with urinary obstruction 10/18/2012   Kerin Perna, PTA 10/31/19 3:10 PM  [facility] [place], [place], [address] Phone: [phone]   Fax:  [phone]  Name: Rayson Rando MRN: [id number] Date of Birth: [birth year]

## 2019-11-01 ENCOUNTER — Telehealth: Payer: Self-pay

## 2019-11-01 NOTE — Telephone Encounter (Addendum)
Patient notified of attached message via phone.   ----- Message from [name], MD sent at 11/01/2019  9:27 AM EDT ----- Call - the light chain -- kappa -- is stable!!!  [name] [name]!!  [name] [name]

## 2019-11-04 ENCOUNTER — Inpatient Hospital Stay: Payer: Medicare Other

## 2019-11-04 ENCOUNTER — Ambulatory Visit (INDEPENDENT_AMBULATORY_CARE_PROVIDER_SITE_OTHER): Payer: Medicare Other | Admitting: Physical Therapy

## 2019-11-04 ENCOUNTER — Other Ambulatory Visit: Payer: Self-pay

## 2019-11-04 ENCOUNTER — Encounter: Payer: Self-pay | Admitting: Physical Therapy

## 2019-11-04 DIAGNOSIS — R293 Abnormal posture: Secondary | ICD-10-CM

## 2019-11-04 DIAGNOSIS — M6281 Muscle weakness (generalized): Secondary | ICD-10-CM

## 2019-11-04 DIAGNOSIS — M545 Low back pain, unspecified: Secondary | ICD-10-CM

## 2019-11-04 DIAGNOSIS — C9001 Multiple myeloma in remission: Secondary | ICD-10-CM | POA: Diagnosis not present

## 2019-11-04 DIAGNOSIS — G8929 Other chronic pain: Secondary | ICD-10-CM | POA: Diagnosis not present

## 2019-11-04 DIAGNOSIS — R29898 Other symptoms and signs involving the musculoskeletal system: Secondary | ICD-10-CM

## 2019-11-04 DIAGNOSIS — C9002 Multiple myeloma in relapse: Secondary | ICD-10-CM

## 2019-11-04 NOTE — Therapy (Signed)
[name], [place], [address] Phone: [phone]   Fax:  [phone]  Physical Therapy Treatment  Patient Details  Name: [name] [name] MRN: [id number] Date of Birth: [birth year] Referring Provider (PT): Aundria Mems, MD   Encounter Date: 11/04/2019   PT End of Session - 11/04/19 1109    Visit Number 3    Number of Visits 12    Date for PT Re-Evaluation 12/09/19    Authorization Type medicare/ AARP    PT Start Time 1103    PT Stop Time 1144    PT Time Calculation (min) 41 min    Activity Tolerance Patient tolerated treatment well    Behavior During Therapy [facility] for tasks assessed/performed           Past Medical History:  Diagnosis Date  . Acute deep vein thrombosis (DVT) of tibial vein of right lower extremity ([place]) 09/29/2014  . Arthritis    back  . Asthma    uses Advair bid and asmanex prn  . Cancer ([place])    multiple myeloma  . Chronic back pain    buldging disc and scoliosis  . Chronic bronchitis ([place])    couple of yrs ago  . Complication of anesthesia    severe case of hiccups after hernia surgery;required medication  . Diverticulosis   . Dizziness    related to neck issues  . Emphysema   . Enlarged prostate    takes Flomax daily  . GERD (gastroesophageal reflux disease)    takes PRotonix daily  . History of colon polyps   . Hyperlipidemia    takes Zocor daily  . Hypertension    takes HCTZ,Diovan daily and Isosorbide daily  . Itching    down left arm and leg  . Joint pain   . Macular degeneration    mild,beginning  . Multiple myeloma ([place]) 08/13/2014  . Myocardial infarction ([place]) 2010  . Neck pain   . Pain in limb 06/20/2012  . Pneumonia    hx of---12+yrs ago    Past Surgical History:  Procedure Laterality Date  . ANTERIOR CERVICAL DECOMP/DISCECTOMY FUSION  01/31/2012   Procedure: ANTERIOR CERVICAL DECOMPRESSION/DISCECTOMY FUSION 1 LEVEL;  Surgeon: Floyce Stakes, MD;  Location: [facility];  Service: Neurosurgery;  Laterality: N/A;  Cervical Five-Six  Anterior Cervical Decompression /Diskectomy /Fusion/Plate  . BONE MARROW TRANSPLANT    . CARDIAC CATHETERIZATION  2010/2013   06/07/11 [facility]) LAD 25%, D2 75% (small), pLCx 100%, RCA 25%--medical managment   . COLONOSCOPY    . HERNIA REPAIR  2006   x 3   . salivary gland removed     left   . skin cancer removed from left side of face     basal cell carcinoma  . vein removed from left leg      There were no vitals filed for this visit.   Subjective Assessment - 11/04/19 1107    Subjective Pt reports he was sore the afternoon of last session. Next day his midback was painful.  Today, mostly stiff and continued pain.  "Chest seems better"    Pertinent History Multiple myeloma in remission, transplant 2015 (bone marrow), he has had bone scan/petscan/mammogram/MRI to rule out cancer;    Patient Stated Goals elimination of pain (when he moves and gets up out of bed)    Currently in Pain? Yes    Pain Score 6     Pain Location Back    Pain  Orientation Right;Left;Lower    Pain Descriptors / Indicators Sore    Aggravating Factors  certain exercises; prolonged standing    Pain Relieving Factors pain meds              [facility] PT Assessment - 11/04/19 0001      Assessment   Medical Diagnosis chest wall pain, mid back pain    Referring Provider (PT) Aundria Mems, MD    Onset Date/Surgical Date 03/17/19    Hand Dominance Right    Prior Therapy one visit at [facility] but did not return due to limited schedule           [facility] Adult PT Treatment/Exercise - 11/04/19 0001      Lumbar Exercises: Stretches   Passive Hamstring Stretch Right;Left;30 seconds;3 reps   hooklying with strap; PTA assist on last rep   Double Knee to Chest Stretch 1 rep;10 seconds    Other Lumbar Stretch Exercise Supine star gazer x 20 sec. Standing midlevel doorway stretch x 15 sec x 2, Overhead stretch x 10 sec  x 2 reps      Lumbar Exercises: Aerobic   Nustep L5: arms/legs x 5 min       Lumbar Exercises: Seated   Sit to Stand 10 reps   NuStep seat,  core engaged; eccentric lowering   Sit to Stand Limitations No UE support    Other Seated Lumbar Exercises seated on dynadisc, ant/post and side side pelvic tilts to increase lumbar mobility.     Other Seated Lumbar Exercises TA contraction with lap press x  3 sec x 5 rep      Lumbar Exercises: Supine   Ab Set 10 reps;5 seconds    AB Set Limitations multiple variations on cues to engage TA    Bent Knee Raise 10 reps   alternating LEs with TA engaged.    Other Supine Lumbar Exercises scap retraction/ thoracic lift x 5 sec x 8 reps     Other Supine Lumbar Exercises snow angels x 5 reps, range to tolerance      Lumbar Exercises: Sidelying   Other Sidelying Lumbar Exercises open book thoracic rotation x 5 reps each side, 5-10 sec hold.       Manual Therapy   Manual therapy comments I strip of sensitive skin tape applied to Rt lower pec at sternal attachment, and X pattern over L5/Sacrum - to decompress tissue and decrease pain.                        PT Long Term Goals - 10/28/19 1611      PT LONG TERM GOAL #1   Title The patient will be indep with HEP for posture, LE strength, general conditioning and flexibility.    Time 6    Period Weeks    Target Date 12/09/19      PT LONG TERM GOAL #2   Title The patient will improve FOTO limitation from 57% limitation to 47% limitation.    Time 6    Period Weeks    Target Date 12/09/19      PT LONG TERM GOAL #3   Title The patient will improve bilateral hip flexor strength to 5/5.    Time 6    Period Weeks    Target Date 12/09/19      PT LONG TERM GOAL #4   Title The patient will report pain < or equal to 4/10 in mid and low back.  Baseline 7/10 at rest    Time 6    Period Weeks    Target Date 12/09/19      PT LONG TERM GOAL #5   Title The patient will improve AROM spine to  reach to shins without knee flexion.    Time 6    Period Weeks    Target Date 12/09/19                 Plan - 11/04/19 1110    Clinical Impression Statement Bridge seemed to irritate low back last session; held from HEP for now.  Some relief with tape application to chest; repeated to area just above it as well as tape application across sacrum.  Pt reported mild increase in LBP at end of session, but none during specific exercises. Limited TA contraction felt in supine; improved in seated position.  Progressing towards goals.    Personal Factors and Comorbidities Comorbidity 2    Comorbidities HTN, multiple myeloma    Examination-Activity Limitations Lift;Locomotion Level;Reach Overhead;Sleep;Squat;Stairs;Stand    Examination-Participation Restrictions Cleaning;Community Activity    Stability/Clinical Decision Making Stable/Uncomplicated    Rehab Potential Good    PT Frequency 2x / week    PT Duration 6 weeks    PT Treatment/Interventions ADLs/Self Care Home Management;Gait training;Stair training;Functional mobility training;Therapeutic activities;Therapeutic exercise;Balance training;Neuromuscular re-education;Patient/family education;Taping;Manual techniques;Moist Heat;Electrical Stimulation;Iontophoresis 40m/ml Dexamethasone;Aquatic Therapy    PT Next Visit Plan check HEP, add hip strengthening, work on LE and lumbar flexibility, postural strengthening, general ROM, UE nerve gliding    PT Home Exercise Plan Access Code: [email or web address]   Consulted and Agree with Plan of Care Patient           Patient will benefit from skilled therapeutic intervention in order to improve the following deficits and impairments:  Decreased range of motion, Pain, Hypomobility, Postural dysfunction, Impaired flexibility, Decreased strength, Decreased mobility, Increased fascial restricitons, Decreased activity tolerance  Visit Diagnosis: Chronic bilateral low back pain, unspecified whether sciatica  present  Muscle weakness (generalized)  Abnormal posture  Other symptoms and signs involving the musculoskeletal system     Problem List Patient Active Problem List   Diagnosis Date Noted  . Pain of left breast 04/30/2019  . Ingrown toenail of right foot 10/08/2018  . Hypothyroidism 01/08/2018  . Ganglion cyst of left foot 10/19/2017  . Slurring of speech 10/06/2017  . Bronchiectasis without complication ([place] 088/28/0034 . Chest wall pain 12/07/2016  . Dyspnea 04/19/2016  . Renal cyst 04/05/2016  . Lytic bone lesions on xray 12/11/2015  . Other specified abnormal immunological findings in serum 05/11/2015  . Primary osteoarthritis of both knees 05/05/2015  . Long QT interval 04/01/2015  . Encounter for examination for normal comparison or control in clinical research program 03/24/2015  . History of organ or tissue transplant 03/18/2015  . Spondylosis of thoracolumbar region without myelopathy or radiculopathy 12/24/2014  . Solitary pulmonary nodule 12/11/2014  . Peripheral neuropathy 08/15/2014  . Insomnia 08/15/2014  . Multiple myeloma ([place] 08/13/2014  . Primary osteoarthritis of left shoulder 06/18/2014  . Presence of coronary angioplasty implant and graft 05/16/2014  . Obstructive sleep apnea 12/27/2013  . GERD (gastroesophageal reflux disease) 12/27/2013  . Diabetes mellitus, type 2 ([place] 11/12/2013  . Pulmonary emphysema ([place] 11/12/2013  . CAD S/P percutaneous coronary angioplasty 05/10/2013  . Bundle branch block, right 05/08/2013  . Hemorrhoids 03/19/2013  . Degenerative disc disease, cervical 02/19/2013  . Essential hypertension, benign 02/19/2013  . History of pulmonary embolism 02/19/2013  .  Hyperlipidemia 02/19/2013  . Right bundle branch block 02/19/2013  . Asthma with COPD ([place]) 02/19/2013  . Pre-operative cardiovascular examination 02/19/2013  . Iron deficiency anemia 02/19/2013  . Pulmonary embolism ([place]) 11/01/2012  . Benign prostatic  hyperplasia with urinary obstruction 10/18/2012   Kerin Perna, PTA 11/04/19 1:07 PM  [place], [place], [address] Phone: [phone]   Fax:  [phone]  Name: Monta Police MRN: [id number] Date of Birth: 04/15/1940

## 2019-11-06 LAB — UPEP/UIFE/LIGHT CHAINS/TP, 24-HR UR
% BETA, Urine: 0 %
ALPHA 1 URINE: 0 %
Albumin, U: 100 %
Alpha 2, Urine: 0 %
Free Kappa Lt Chains,Ur: 41.21 mg/L (ref 0.63–113.79)
Free Kappa/Lambda Ratio: 18 (ref 1.03–31.76)
Free Lambda Lt Chains,Ur: 2.29 mg/L (ref 0.47–11.77)
GAMMA GLOBULIN URINE: 0 %
Total Protein, Urine-Ur/day: 210 mg/24 hr — ABNORMAL HIGH (ref 30–150)
Total Protein, Urine: 7 mg/dL
Total Volume: 3000

## 2019-11-07 ENCOUNTER — Encounter: Payer: Self-pay | Admitting: Rehabilitative and Restorative Service Providers"

## 2019-11-07 ENCOUNTER — Other Ambulatory Visit: Payer: Self-pay

## 2019-11-07 ENCOUNTER — Ambulatory Visit (INDEPENDENT_AMBULATORY_CARE_PROVIDER_SITE_OTHER): Payer: Medicare Other | Admitting: Rehabilitative and Restorative Service Providers"

## 2019-11-07 DIAGNOSIS — G8929 Other chronic pain: Secondary | ICD-10-CM | POA: Diagnosis not present

## 2019-11-07 DIAGNOSIS — R293 Abnormal posture: Secondary | ICD-10-CM

## 2019-11-07 DIAGNOSIS — M6281 Muscle weakness (generalized): Secondary | ICD-10-CM | POA: Diagnosis not present

## 2019-11-07 DIAGNOSIS — M545 Low back pain, unspecified: Secondary | ICD-10-CM

## 2019-11-07 DIAGNOSIS — R29898 Other symptoms and signs involving the musculoskeletal system: Secondary | ICD-10-CM

## 2019-11-07 NOTE — Patient Instructions (Signed)
Access Code: [email or web address] URL: [email or web address] Date: 11/07/2019 Prepared by: Rudell Cobb  Exercises Supine Thoracic Mobilization Towel Roll Vertical with Arm Stretch - 2 x daily - 7 x weekly - 1 sets - 1 reps - 60-120 seconds hold Sidelying Thoracic Rotation with Open Book - 2 x daily - 7 x weekly - 1 sets - 5-10 reps Supine Hamstring Stretch with Strap - 2 x daily - 7 x weekly - 1 sets - 2 reps - 30 seconds hold Supine Chest Stretch with Elbows Bent - 2 x daily - 7 x weekly - 1 sets - 1-2 reps - 15-30 seconds hold Sit to Stand - 1-2 x daily - 7 x weekly - 1 sets - 10 reps Seated Transversus Abdominis Bracing with PLB - 2 x daily - 7 x weekly - 1 sets - 5 reps Supine Active Straight Leg Raise - 2 x daily - 7 x weekly - 1 sets - 10 reps

## 2019-11-07 NOTE — Therapy (Signed)
[place] [facility] [place], [place], [address] Phone: [phone]   Fax:  [phone]  Physical Therapy Treatment  Patient Details  Name: [name] [name] MRN: [id number] Date of Birth: 12-30-1940 Referring Provider (PT): Aundria Mems, MD   Encounter Date: 11/07/2019   PT End of Session - 11/07/19 1242    Visit Number 4    Number of Visits 12    Date for PT Re-Evaluation 12/09/19    Authorization Type medicare/ [employer]    PT Start Time 1105    PT Stop Time 1149    PT Time Calculation (min) 44 min    Activity Tolerance Patient tolerated treatment well    Behavior During Therapy [facility] for tasks assessed/performed           Past Medical History:  Diagnosis Date   Acute deep vein thrombosis (DVT) of tibial vein of right lower extremity ([place]) 09/29/2014   Arthritis    back   Asthma    uses Advair bid and asmanex prn   Cancer ([place])    multiple myeloma   Chronic back pain    buldging disc and scoliosis   Chronic bronchitis ([place])    couple of yrs ago   Complication of anesthesia    severe case of hiccups after hernia surgery;required medication   Diverticulosis    Dizziness    related to neck issues   Emphysema    Enlarged prostate    takes Flomax daily   GERD (gastroesophageal reflux disease)    takes PRotonix daily   History of colon polyps    Hyperlipidemia    takes Zocor daily   Hypertension    takes HCTZ,Diovan daily and Isosorbide daily   Itching    down left arm and leg   Joint pain    Macular degeneration    mild,beginning   Multiple myeloma ([place]) 08/13/2014   Myocardial infarction ([place]) 2010   Neck pain    Pain in limb 06/20/2012   Pneumonia    hx of---12+yrs ago    Past Surgical History:  Procedure Laterality Date   ANTERIOR CERVICAL DECOMP/DISCECTOMY FUSION  01/31/2012   Procedure: ANTERIOR CERVICAL DECOMPRESSION/DISCECTOMY FUSION 1 LEVEL;  Surgeon: Floyce Stakes, MD;  Location: [facility];  Service: Neurosurgery;  Laterality: N/A;  Cervical Five-Six  Anterior Cervical Decompression /Diskectomy /Fusion/Plate   BONE MARROW TRANSPLANT     CARDIAC CATHETERIZATION  2010/2013   06/07/11 [facility]) LAD 25%, D2 75% (small), pLCx 100%, RCA 25%--medical managment    COLONOSCOPY     HERNIA REPAIR  2006   x 3    salivary gland removed     left    skin cancer removed from left side of face     basal cell carcinoma   vein removed from left leg      There were no vitals filed for this visit.   Subjective Assessment - 11/07/19 1110    Subjective The patient reports the tape helped his low back    Pertinent History Multiple myeloma in remission, transplant 2015 (bone marrow), he has had bone scan/petscan/mammogram/MRI to rule out cancer;    Patient Stated Goals elimination of pain (when he moves and gets up out of bed)    Currently in Pain? Yes    Pain Score 4     Pain Location Back    Pain Orientation Lower    Pain Descriptors / Indicators Sore    Pain Type Chronic pain  Pain Onset More than a month ago    Pain Frequency Intermittent    Aggravating Factors  prolonged standing    Pain Relieving Factors stretching, tape, pain meds              [facility] PT Assessment - 11/07/19 1113      Assessment   Medical Diagnosis chest wall pain, mid back pain    Referring Provider (PT) Rodney Langton, MD    Onset Date/Surgical Date 03/17/19                         [facility] Adult PT Treatment/Exercise - 11/07/19 1113      Self-Care   Self-Care Other Self-Care Comments    Other Self-Care Comments  discussed small goals to increase mobility      Exercises   Exercises Lumbar      Lumbar Exercises: Stretches   Active Hamstring Stretch Right;Left;1 rep;30 seconds    Passive Hamstring Stretch Right;Left;2 reps;30 seconds    Quad Stretch Right;Left;2 reps;30 seconds      Lumbar Exercises: Aerobic   Tread Mill 1.8 mph x 3  minutes for warm up      Lumbar Exercises: Standing   Heel Raises 20 reps    Heel Raises Limitations with arms overhead for core engagement    Functional Squats 10 reps      Lumbar Exercises: Supine   Bent Knee Raise 20 reps    Bridge 5 reps    Straight Leg Raise 10 reps    Straight Leg Raises Limitations more difficult on L LE than R LE    Other Supine Lumbar Exercises supine ball squeeze and then added bridge x 10 reps    Other Supine Lumbar Exercises Less pain with adductor squeeze with bridge      Lumbar Exercises: Sidelying   Hip Abduction Right;Left;10 reps    Hip Abduction Limitations feels R hip abduction in low back      Lumbar Exercises: Prone   Straight Leg Raise 10 reps      Manual Therapy   Manual Therapy Taping    Manual therapy comments I strips of sensitive skin tape in X pattern lower back/sacral region                  PT Education - 11/07/19 1216    Education Details HEP progression    Person(s) Educated Patient    Methods Explanation;Demonstration;Handout    Comprehension Returned demonstration;Verbalized understanding               PT Long Term Goals - 10/28/19 1611      PT LONG TERM GOAL #1   Title The patient will be indep with HEP for posture, LE strength, general conditioning and flexibility.    Time 6    Period Weeks    Target Date 12/09/19      PT LONG TERM GOAL #2   Title The patient will improve FOTO limitation from 57% limitation to 47% limitation.    Time 6    Period Weeks    Target Date 12/09/19      PT LONG TERM GOAL #3   Title The patient will improve bilateral hip flexor strength to 5/5.    Time 6    Period Weeks    Target Date 12/09/19      PT LONG TERM GOAL #4   Title The patient will report pain < or equal to 4/10 in mid and  low back.    Baseline 7/10 at rest    Time 6    Period Weeks    Target Date 12/09/19      PT LONG TERM GOAL #5   Title The patient will improve AROM spine to reach to shins without  knee flexion.    Time 6    Period Weeks    Target Date 12/09/19                 Plan - 11/07/19 1243    Clinical Impression Statement The patient has increased soreness with strengthening today noting mild increase in pain from arrival rating.  He tolerates bridges with adductor squeeze and progression of standing strengthening actiities.  PT will begin to transition HEP to add more strengthening.    Rehab Potential Good    PT Frequency 2x / week    PT Duration 6 weeks    PT Treatment/Interventions ADLs/Self Care Home Management;Gait training;Stair training;Functional mobility training;Therapeutic activities;Therapeutic exercise;Balance training;Neuromuscular re-education;Patient/family education;Taping;Manual techniques;Moist Heat;Electrical Stimulation;Iontophoresis 4mg /ml Dexamethasone;Aquatic Therapy    PT Next Visit Plan check HEP, add hip strengthening, work on LE and lumbar flexibility, postural strengthening    PT Home Exercise Plan Access Code: [email or web address]    Consulted and Agree with Plan of Care Patient           Patient will benefit from skilled therapeutic intervention in order to improve the following deficits and impairments:  Decreased range of motion, Pain, Hypomobility, Postural dysfunction, Impaired flexibility, Decreased strength, Decreased mobility, Increased fascial restricitons, Decreased activity tolerance  Visit Diagnosis: Chronic bilateral low back pain, unspecified whether sciatica present  Muscle weakness (generalized)  Abnormal posture  Other symptoms and signs involving the musculoskeletal system     Problem List Patient Active Problem List   Diagnosis Date Noted   Pain of left breast 04/30/2019   Ingrown toenail of right foot 10/08/2018   Hypothyroidism 01/08/2018   Ganglion cyst of left foot 10/19/2017   Slurring of speech 10/06/2017   Bronchiectasis without complication ([place]) 60/11/9321   Chest wall pain 12/07/2016   Dyspnea  04/19/2016   Renal cyst 04/05/2016   Lytic bone lesions on xray 12/11/2015   Other specified abnormal immunological findings in serum 05/11/2015   Primary osteoarthritis of both knees 05/05/2015   Long QT interval 04/01/2015   Encounter for examination for normal comparison or control in clinical research program 03/24/2015   History of organ or tissue transplant 03/18/2015   Spondylosis of thoracolumbar region without myelopathy or radiculopathy 12/24/2014   Solitary pulmonary nodule 12/11/2014   Peripheral neuropathy 08/15/2014   Insomnia 08/15/2014   Multiple myeloma ([place]) 08/13/2014   Primary osteoarthritis of left shoulder 06/18/2014   Presence of coronary angioplasty implant and graft 05/16/2014   Obstructive sleep apnea 12/27/2013   GERD (gastroesophageal reflux disease) 12/27/2013   Diabetes mellitus, type 2 ([place]) 11/12/2013   Pulmonary emphysema ([place]) 11/12/2013   CAD S/P percutaneous coronary angioplasty 05/10/2013   Bundle branch block, right 05/08/2013   Hemorrhoids 03/19/2013   Degenerative disc disease, cervical 02/19/2013   Essential hypertension, benign 02/19/2013   History of pulmonary embolism 02/19/2013   Hyperlipidemia 02/19/2013   Right bundle branch block 02/19/2013   Asthma with COPD ([place]) 02/19/2013   Pre-operative cardiovascular examination 02/19/2013   Iron deficiency anemia 02/19/2013   Pulmonary embolism ([place]) 11/01/2012   Benign prostatic hyperplasia with urinary obstruction 10/18/2012    Jaritza Duignan, PT 11/07/2019, 12:50 PM  [facility] [address]  [place], [place], [address] Phone: [phone]   Fax:  [phone]  Name: [name] [name] MRN: [id number] Date of Birth: 11-24-1940

## 2019-11-11 ENCOUNTER — Other Ambulatory Visit: Payer: Self-pay

## 2019-11-11 ENCOUNTER — Ambulatory Visit (INDEPENDENT_AMBULATORY_CARE_PROVIDER_SITE_OTHER): Payer: Medicare Other | Admitting: Rehabilitative and Restorative Service Providers"

## 2019-11-11 DIAGNOSIS — R293 Abnormal posture: Secondary | ICD-10-CM

## 2019-11-11 DIAGNOSIS — G8929 Other chronic pain: Secondary | ICD-10-CM | POA: Diagnosis not present

## 2019-11-11 DIAGNOSIS — M545 Low back pain, unspecified: Secondary | ICD-10-CM

## 2019-11-11 DIAGNOSIS — M6281 Muscle weakness (generalized): Secondary | ICD-10-CM

## 2019-11-11 DIAGNOSIS — R29898 Other symptoms and signs involving the musculoskeletal system: Secondary | ICD-10-CM | POA: Diagnosis not present

## 2019-11-11 NOTE — Therapy (Signed)
[place], [place], [address] Phone: [phone]   Fax:  [phone]  Physical Therapy Treatment  Patient Details  Name: [name] [name] MRN: [id number] Date of Birth: 1940-07-12 Referring Provider (PT): Aundria Mems, MD   Encounter Date: 11/11/2019   PT End of Session - 11/11/19 1101    Visit Number 5    Number of Visits 12    Date for PT Re-Evaluation 12/09/19    Authorization Type medicare/ [employer]    PT Start Time 1103    PT Stop Time 1145    PT Time Calculation (min) 42 min    Activity Tolerance Patient tolerated treatment well    Behavior During Therapy [facility] for tasks assessed/performed           Past Medical History:  Diagnosis Date  . Acute deep vein thrombosis (DVT) of tibial vein of right lower extremity ([place]) 09/29/2014  . Arthritis    back  . Asthma    uses Advair bid and asmanex prn  . Cancer ([place])    multiple myeloma  . Chronic back pain    buldging disc and scoliosis  . Chronic bronchitis ([place])    couple of yrs ago  . Complication of anesthesia    severe case of hiccups after hernia surgery;required medication  . Diverticulosis   . Dizziness    related to neck issues  . Emphysema   . Enlarged prostate    takes Flomax daily  . GERD (gastroesophageal reflux disease)    takes PRotonix daily  . History of colon polyps   . Hyperlipidemia    takes Zocor daily  . Hypertension    takes HCTZ,Diovan daily and Isosorbide daily  . Itching    down left arm and leg  . Joint pain   . Macular degeneration    mild,beginning  . Multiple myeloma ([place]) 08/13/2014  . Myocardial infarction ([place]) 2010  . Neck pain   . Pain in limb 06/20/2012  . Pneumonia    hx of---12+yrs ago    Past Surgical History:  Procedure Laterality Date  . ANTERIOR CERVICAL DECOMP/DISCECTOMY FUSION  01/31/2012   Procedure: ANTERIOR CERVICAL DECOMPRESSION/DISCECTOMY FUSION 1 LEVEL;  Surgeon: Floyce Stakes, MD;  Location: [facility];  Service: Neurosurgery;  Laterality: N/A;  Cervical Five-Six  Anterior Cervical Decompression /Diskectomy /Fusion/Plate  . BONE MARROW TRANSPLANT    . CARDIAC CATHETERIZATION  2010/2013   06/07/11 [facility]) LAD 25%, D2 75% (small), pLCx 100%, RCA 25%--medical managment   . COLONOSCOPY    . HERNIA REPAIR  2006   x 3   . salivary gland removed     left   . skin cancer removed from left side of face     basal cell carcinoma  . vein removed from left leg      There were no vitals filed for this visit.   Subjective Assessment - 11/11/19 1156    Subjective The patient reports he is feeling better wiht his back except he had to bend to reach behind his printer this morning and felt this in his back.  He is doing 3000 steps a day x last 2 days and plans to slowly ramp up to 3500 steps.  He  has not needed pain meds (until this morning due to moving the printer) since last week.    Pertinent History Multiple myeloma in remission, transplant 2015 (bone marrow), he has had bone scan/petscan/mammogram/MRI to rule out cancer;  Patient Stated Goals elimination of pain (when he moves and gets up out of bed)    Currently in Pain? Yes    Pain Score 6     Pain Location Back    Pain Orientation Lower    Pain Descriptors / Indicators Sore    Pain Type --    Pain Onset More than a month ago    Pain Frequency Intermittent    Aggravating Factors  prolonged standing    Pain Relieving Factors stretching, tape, pain meds    Effect of Pain on Daily Activities Patient reports "haven't taken pain pills for a week which is remarkable for me"                             [facility] Adult PT Treatment/Exercise - 11/11/19 1108      Neuro Re-ed    Neuro Re-ed Details  compliant surface standing with wide base adding rows and then with feet narrowing adding eyes closed for challenge.; BOSU standing with UE support working on stabilizing through hips       Exercises   Exercises Lumbar      Lumbar Exercises: Stretches   Passive Hamstring Stretch Right;Left;2 reps;30 seconds    Single Knee to Chest Stretch Right;Left;1 rep;60 seconds    Double Knee to Chest Stretch 1 rep;60 seconds    Quad Stretch Right;Left;2 reps;30 seconds      Lumbar Exercises: Aerobic   Tread Mill up to 1.8 mph x 4 minutes for warm up      Lumbar Exercises: Standing   Heel Raises 20 reps    Functional Squats 10 reps    Functional Squats Limitations also performed mini squats x 5 reps on BOSU with min A to get onto/off of BOSU    Scapular Retraction 15 reps;Strengthening;Both    Scapular Retraction Limitations with pool noodle for proprioceptive training    Row Strengthening;Both;10 reps    Theraband Level (Row) Level 2 (Red)    Row Limitations on foam with horizontal abduction    Other Standing Lumbar Exercises lateral step up x 10 reps R and L sides    Other Standing Lumbar Exercises alternating LE step downs x 10 reps alternating from 2" foam surface with UE support and SBA; standing marching with alternating UE flexion      Lumbar Exercises: Supine   Bridge with Ball Squeeze 10 reps    Straight Leg Raise 10 reps    Straight Leg Raises Limitations today more difficult R LE      Lumbar Exercises: Sidelying   Hip Abduction Right;Left;10 reps                       PT Long Term Goals - 10/28/19 1611      PT LONG TERM GOAL #1   Title The patient will be indep with HEP for posture, LE strength, general conditioning and flexibility.    Time 6    Period Weeks    Target Date 12/09/19      PT LONG TERM GOAL #2   Title The patient will improve FOTO limitation from 57% limitation to 47% limitation.    Time 6    Period Weeks    Target Date 12/09/19      PT LONG TERM GOAL #3   Title The patient will improve bilateral hip flexor strength to 5/5.    Time 6    Period Weeks  Target Date 12/09/19      PT LONG TERM GOAL #4   Title The patient will  report pain < or equal to 4/10 in mid and low back.    Baseline 7/10 at rest    Time 6    Period Weeks    Target Date 12/09/19      PT LONG TERM GOAL #5   Title The patient will improve AROM spine to reach to shins without knee flexion.    Time 6    Period Weeks    Target Date 12/09/19                 Plan - 11/11/19 1145    Clinical Impression Statement The patient is noting increased strength and less pain until this morning when he slid a printer from the shelf.  PT is continuing to progress strengthening adding compliant surface training and more squats today.    Rehab Potential Good    PT Frequency 2x / week    PT Duration 6 weeks    PT Treatment/Interventions ADLs/Self Care Home Management;Gait training;Stair training;Functional mobility training;Therapeutic activities;Therapeutic exercise;Balance training;Neuromuscular re-education;Patient/family education;Taping;Manual techniques;Moist Heat;Electrical Stimulation;Iontophoresis 4mg /ml Dexamethasone;Aquatic Therapy    PT Next Visit Plan check HEP, add hip strengthening, work on LE and lumbar flexibility, postural strengthening    PT Home Exercise Plan Access Code: [email or web address]    Consulted and Agree with Plan of Care Patient           Patient will benefit from skilled therapeutic intervention in order to improve the following deficits and impairments:  Decreased range of motion, Pain, Hypomobility, Postural dysfunction, Impaired flexibility, Decreased strength, Decreased mobility, Increased fascial restricitons, Decreased activity tolerance  Visit Diagnosis: Muscle weakness (generalized)  Chronic bilateral low back pain, unspecified whether sciatica present  Abnormal posture  Other symptoms and signs involving the musculoskeletal system     Problem List Patient Active Problem List   Diagnosis Date Noted  . Pain of left breast 04/30/2019  . Ingrown toenail of right foot 10/08/2018  . Hypothyroidism 01/08/2018   . Ganglion cyst of left foot 10/19/2017  . Slurring of speech 10/06/2017  . Bronchiectasis without complication ([place]) 11/91/4782  . Chest wall pain 12/07/2016  . Dyspnea 04/19/2016  . Renal cyst 04/05/2016  . Lytic bone lesions on xray 12/11/2015  . Other specified abnormal immunological findings in serum 05/11/2015  . Primary osteoarthritis of both knees 05/05/2015  . Long QT interval 04/01/2015  . Encounter for examination for normal comparison or control in clinical research program 03/24/2015  . History of organ or tissue transplant 03/18/2015  . Spondylosis of thoracolumbar region without myelopathy or radiculopathy 12/24/2014  . Solitary pulmonary nodule 12/11/2014  . Peripheral neuropathy 08/15/2014  . Insomnia 08/15/2014  . Multiple myeloma ([place]) 08/13/2014  . Primary osteoarthritis of left shoulder 06/18/2014  . Presence of coronary angioplasty implant and graft 05/16/2014  . Obstructive sleep apnea 12/27/2013  . GERD (gastroesophageal reflux disease) 12/27/2013  . Diabetes mellitus, type 2 ([place]) 11/12/2013  . Pulmonary emphysema ([place]) 11/12/2013  . CAD S/P percutaneous coronary angioplasty 05/10/2013  . Bundle branch block, right 05/08/2013  . Hemorrhoids 03/19/2013  . Degenerative disc disease, cervical 02/19/2013  . Essential hypertension, benign 02/19/2013  . History of pulmonary embolism 02/19/2013  . Hyperlipidemia 02/19/2013  . Right bundle branch block 02/19/2013  . Asthma with COPD ([place]) 02/19/2013  . Pre-operative cardiovascular examination 02/19/2013  . Iron deficiency anemia 02/19/2013  . Pulmonary embolism ([place]) 11/01/2012  .  Benign prostatic hyperplasia with urinary obstruction 10/18/2012    Analiah Drum, PT 11/11/2019, 1:02 PM  [facility] [place], [place], [address] Phone: [phone]   Fax:  [phone]  Name: [name] [name] MRN: [id number] Date of Birth:  [birth year]

## 2019-11-13 ENCOUNTER — Ambulatory Visit: Payer: Medicare Other | Admitting: Cardiology

## 2019-11-14 ENCOUNTER — Other Ambulatory Visit: Payer: Self-pay

## 2019-11-14 ENCOUNTER — Ambulatory Visit (INDEPENDENT_AMBULATORY_CARE_PROVIDER_SITE_OTHER): Payer: Medicare Other | Admitting: Rehabilitative and Restorative Service Providers"

## 2019-11-14 ENCOUNTER — Encounter: Payer: Self-pay | Admitting: Rehabilitative and Restorative Service Providers"

## 2019-11-14 DIAGNOSIS — G8929 Other chronic pain: Secondary | ICD-10-CM

## 2019-11-14 DIAGNOSIS — R293 Abnormal posture: Secondary | ICD-10-CM

## 2019-11-14 DIAGNOSIS — M545 Low back pain, unspecified: Secondary | ICD-10-CM

## 2019-11-14 DIAGNOSIS — R29898 Other symptoms and signs involving the musculoskeletal system: Secondary | ICD-10-CM | POA: Diagnosis not present

## 2019-11-14 DIAGNOSIS — M6281 Muscle weakness (generalized): Secondary | ICD-10-CM | POA: Diagnosis not present

## 2019-11-14 NOTE — Patient Instructions (Signed)
Access Code: [email or web address] URL: [email or web address] Date: 11/14/2019 Prepared by: Rudell Cobb  Exercises Supine Thoracic Mobilization Towel Roll Vertical with Arm Stretch - 2 x daily - 7 x weekly - 1 sets - 1 reps - 60-120 seconds hold Sidelying Thoracic Rotation with Open Book - 2 x daily - 7 x weekly - 1 sets - 5-10 reps Supine Chest Stretch with Elbows Bent - 2 x daily - 7 x weekly - 1 sets - 1-2 reps - 15-30 seconds hold Supine Active Straight Leg Raise - 2 x daily - 7 x weekly - 1 sets - 10 reps Seated Hamstring Stretch with Chair - 2 x daily - 7 x weekly - 1 sets - 2-3 reps - 30-45 seconds hold Sit to Stand - 1-2 x daily - 7 x weekly - 1 sets - 10 reps Seated Transversus Abdominis Bracing with PLB - 2 x daily - 7 x weekly - 1 sets - 5 reps

## 2019-11-14 NOTE — Therapy (Signed)
[place], [place], [address] Phone: [phone]   Fax:  [phone]  Physical Therapy Treatment  Patient Details  Name: [name] [name] MRN: [id number] Date of Birth: 1940/10/19 Referring Provider (PT): Aundria Mems, MD   Encounter Date: 11/14/2019   PT End of Session - 11/14/19 1109    Visit Number 6    Number of Visits 12    Date for PT Re-Evaluation 12/09/19    Authorization Type medicare/ [employer]    PT Start Time 1105    PT Stop Time 1145    PT Time Calculation (min) 40 min    Activity Tolerance Patient tolerated treatment well    Behavior During Therapy [facility] for tasks assessed/performed           Past Medical History:  Diagnosis Date  . Acute deep vein thrombosis (DVT) of tibial vein of right lower extremity ([place]) 09/29/2014  . Arthritis    back  . Asthma    uses Advair bid and asmanex prn  . Cancer ([place])    multiple myeloma  . Chronic back pain    buldging disc and scoliosis  . Chronic bronchitis ([place])    couple of yrs ago  . Complication of anesthesia    severe case of hiccups after hernia surgery;required medication  . Diverticulosis   . Dizziness    related to neck issues  . Emphysema   . Enlarged prostate    takes Flomax daily  . GERD (gastroesophageal reflux disease)    takes PRotonix daily  . History of colon polyps   . Hyperlipidemia    takes Zocor daily  . Hypertension    takes HCTZ,Diovan daily and Isosorbide daily  . Itching    down left arm and leg  . Joint pain   . Macular degeneration    mild,beginning  . Multiple myeloma ([place]) 08/13/2014  . Myocardial infarction ([place]) 2010  . Neck pain   . Pain in limb 06/20/2012  . Pneumonia    hx of---12+yrs ago    Past Surgical History:  Procedure Laterality Date  . ANTERIOR CERVICAL DECOMP/DISCECTOMY FUSION  01/31/2012   Procedure: ANTERIOR CERVICAL DECOMPRESSION/DISCECTOMY FUSION 1 LEVEL;  Surgeon: Floyce Stakes, MD;  Location: [facility];  Service: Neurosurgery;  Laterality: N/A;  Cervical Five-Six  Anterior Cervical Decompression /Diskectomy /Fusion/Plate  . BONE MARROW TRANSPLANT    . CARDIAC CATHETERIZATION  2010/2013   06/07/11 [facility]) LAD 25%, D2 75% (small), pLCx 100%, RCA 25%--medical managment   . COLONOSCOPY    . HERNIA REPAIR  2006   x 3   . salivary gland removed     left   . skin cancer removed from left side of face     basal cell carcinoma  . vein removed from left leg      There were no vitals filed for this visit.   Subjective Assessment - 11/14/19 1104    Subjective The patient reports still sore this week after lifting the printer earlier this week.    Pertinent History Multiple myeloma in remission, transplant 2015 (bone marrow), he has had bone scan/petscan/mammogram/MRI to rule out cancer;    Patient Stated Goals elimination of pain (when he moves and gets up out of bed)    Currently in Pain? Yes    Pain Score 6     Pain Location Back    Pain Orientation Lower    Pain Descriptors / Indicators Sore  Pain Type Chronic pain    Pain Onset More than a month ago    Pain Frequency Intermittent    Aggravating Factors  prolonged standing    Pain Relieving Factors stretching, tape, pain meds              [facility] PT Assessment - 11/14/19 1111      Assessment   Medical Diagnosis chest wall pain, mid back pain    Referring Provider (PT) Aundria Mems, MD    Onset Date/Surgical Date 03/17/19                         [facility] Adult PT Treatment/Exercise - 11/14/19 1111      Exercises   Exercises Lumbar      Lumbar Exercises: Stretches   Passive Hamstring Stretch Right;Left;2 reps;30 seconds    Passive Hamstring Stretch Limitations added contract/relax for greater stretch    Other Lumbar Stretch Exercise supine butterfly stretch    Other Lumbar Stretch Exercise supine lumbar rocking with lateral hip stretch      Lumbar Exercises:  Standing   Functional Squats 10 reps    Functional Squats Limitations 5 lbs with functional squat picking up kettle bell from end of mat    Lifting From waist;10 reps    Forward Lunge Limitations forward kicks with small backward lunge for balance and core activiation near wall for balance x 8 reps R and L    Scapular Retraction Strengthening;10 reps    Other Standing Lumbar Exercises lateral step ups 4" step x 12 reps R and L sides    Other Standing Lumbar Exercises slowmarches for core engagement      Lumbar Exercises: Supine   Other Supine Lumbar Exercises supine/hooklying hip rotation moving R ankle to L knee and reverse x 10 reps      Lumbar Exercises: Sidelying   Hip Abduction Right;Left;10 reps      Lumbar Exercises: Prone   Straight Leg Raise 10 reps    Other Prone Lumbar Exercises prone knee flexion x 10 reps R and L      Manual Therapy   Manual Therapy Taping    Kinesiotex Inhibit Muscle                  PT Education - 11/14/19 1144    Education Details HEP to modify hamstring stretch    Person(s) Educated Patient    Methods Explanation;Demonstration;Handout    Comprehension Returned demonstration;Verbalized understanding               PT Long Term Goals - 10/28/19 1611      PT LONG TERM GOAL #1   Title The patient will be indep with HEP for posture, LE strength, general conditioning and flexibility.    Time 6    Period Weeks    Target Date 12/09/19      PT LONG TERM GOAL #2   Title The patient will improve FOTO limitation from 57% limitation to 47% limitation.    Time 6    Period Weeks    Target Date 12/09/19      PT LONG TERM GOAL #3   Title The patient will improve bilateral hip flexor strength to 5/5.    Time 6    Period Weeks    Target Date 12/09/19      PT LONG TERM GOAL #4   Title The patient will report pain < or equal to 4/10 in mid and low  back.    Baseline 7/10 at rest    Time 6    Period Weeks    Target Date 12/09/19       PT LONG TERM GOAL #5   Title The patient will improve AROM spine to reach to shins without knee flexion.    Time 6    Period Weeks    Target Date 12/09/19                 Plan - 11/14/19 1119    Clinical Impression Statement The patient is tolerating progression of ther ex.  He continues with soreness in low back after lifting a printer earlier this week.  PT to continue working to [employer].    Rehab Potential Good    PT Frequency 2x / week    PT Duration 6 weeks    PT Treatment/Interventions ADLs/Self Care Home Management;Gait training;Stair training;Functional mobility training;Therapeutic activities;Therapeutic exercise;Balance training;Neuromuscular re-education;Patient/family education;Taping;Manual techniques;Moist Heat;Electrical Stimulation;Iontophoresis 69m/ml Dexamethasone;Aquatic Therapy    PT Next Visit Plan check HEP, add hip strengthening, work on LE and lumbar flexibility, postural strengthening    PT Home Exercise Plan Access Code: [email or web address]   Consulted and Agree with Plan of Care Patient           Patient will benefit from skilled therapeutic intervention in order to improve the following deficits and impairments:  Decreased range of motion, Pain, Hypomobility, Postural dysfunction, Impaired flexibility, Decreased strength, Decreased mobility, Increased fascial restricitons, Decreased activity tolerance  Visit Diagnosis: Muscle weakness (generalized)  Chronic bilateral low back pain, unspecified whether sciatica present  Abnormal posture  Other symptoms and signs involving the musculoskeletal system     Problem List Patient Active Problem List   Diagnosis Date Noted  . Pain of left breast 04/30/2019  . Ingrown toenail of right foot 10/08/2018  . Hypothyroidism 01/08/2018  . Ganglion cyst of left foot 10/19/2017  . Slurring of speech 10/06/2017  . Bronchiectasis without complication ([place] 014/60/4799 . Chest wall pain 12/07/2016  . Dyspnea 04/19/2016   . Renal cyst 04/05/2016  . Lytic bone lesions on xray 12/11/2015  . Other specified abnormal immunological findings in serum 05/11/2015  . Primary osteoarthritis of both knees 05/05/2015  . Long QT interval 04/01/2015  . Encounter for examination for normal comparison or control in clinical research program 03/24/2015  . History of organ or tissue transplant 03/18/2015  . Spondylosis of thoracolumbar region without myelopathy or radiculopathy 12/24/2014  . Solitary pulmonary nodule 12/11/2014  . Peripheral neuropathy 08/15/2014  . Insomnia 08/15/2014  . Multiple myeloma ([place] 08/13/2014  . Primary osteoarthritis of left shoulder 06/18/2014  . Presence of coronary angioplasty implant and graft 05/16/2014  . Obstructive sleep apnea 12/27/2013  . GERD (gastroesophageal reflux disease) 12/27/2013  . Diabetes mellitus, type 2 ([place] 11/12/2013  . Pulmonary emphysema ([place] 11/12/2013  . CAD S/P percutaneous coronary angioplasty 05/10/2013  . Bundle branch block, right 05/08/2013  . Hemorrhoids 03/19/2013  . Degenerative disc disease, cervical 02/19/2013  . Essential hypertension, benign 02/19/2013  . History of pulmonary embolism 02/19/2013  . Hyperlipidemia 02/19/2013  . Right bundle branch block 02/19/2013  . Asthma with COPD ([place] 02/19/2013  . Pre-operative cardiovascular examination 02/19/2013  . Iron deficiency anemia 02/19/2013  . Pulmonary embolism ([place] 11/01/2012  . Benign prostatic hyperplasia with urinary obstruction 10/18/2012    Alexxander Kurt , PT 11/14/2019, 12:57 PM  [facility] [address]Phone: [phone]  Fax:  [phone]  Name: [name] [name] MRN: [id number] Date of Birth: 04/14/1940

## 2019-11-18 ENCOUNTER — Other Ambulatory Visit: Payer: Self-pay

## 2019-11-18 ENCOUNTER — Encounter: Payer: Self-pay | Admitting: Physical Therapy

## 2019-11-18 ENCOUNTER — Ambulatory Visit (INDEPENDENT_AMBULATORY_CARE_PROVIDER_SITE_OTHER): Payer: Medicare Other | Admitting: Physical Therapy

## 2019-11-18 DIAGNOSIS — M545 Low back pain, unspecified: Secondary | ICD-10-CM | POA: Diagnosis not present

## 2019-11-18 DIAGNOSIS — R29898 Other symptoms and signs involving the musculoskeletal system: Secondary | ICD-10-CM | POA: Diagnosis not present

## 2019-11-18 DIAGNOSIS — R293 Abnormal posture: Secondary | ICD-10-CM | POA: Diagnosis not present

## 2019-11-18 DIAGNOSIS — G8929 Other chronic pain: Secondary | ICD-10-CM

## 2019-11-18 DIAGNOSIS — M6281 Muscle weakness (generalized): Secondary | ICD-10-CM | POA: Diagnosis not present

## 2019-11-18 NOTE — Therapy (Signed)
[place], [place], [address] Phone: [phone]   Fax:  [phone]  Physical Therapy Treatment  Patient Details  Name: [name] [name] MRN: [id number] Date of Birth: December 29, 1940 Referring Provider (PT): Aundria Mems, MD   Encounter Date: 11/18/2019   PT End of Session - 11/18/19 1103    Visit Number 7    Number of Visits 12    Date for PT Re-Evaluation 12/09/19    Authorization Type medicare/ AARP    PT Start Time 1103    PT Stop Time 1142    PT Time Calculation (min) 39 min    Activity Tolerance Patient tolerated treatment well    Behavior During Therapy [facility] for tasks assessed/performed           Past Medical History:  Diagnosis Date  . Acute deep vein thrombosis (DVT) of tibial vein of right lower extremity ([place]) 09/29/2014  . Arthritis    back  . Asthma    uses Advair bid and asmanex prn  . Cancer ([place])    multiple myeloma  . Chronic back pain    buldging disc and scoliosis  . Chronic bronchitis ([place])    couple of yrs ago  . Complication of anesthesia    severe case of hiccups after hernia surgery;required medication  . Diverticulosis   . Dizziness    related to neck issues  . Emphysema   . Enlarged prostate    takes Flomax daily  . GERD (gastroesophageal reflux disease)    takes PRotonix daily  . History of colon polyps   . Hyperlipidemia    takes Zocor daily  . Hypertension    takes HCTZ,Diovan daily and Isosorbide daily  . Itching    down left arm and leg  . Joint pain   . Macular degeneration    mild,beginning  . Multiple myeloma ([place]) 08/13/2014  . Myocardial infarction ([place]) 2010  . Neck pain   . Pain in limb 06/20/2012  . Pneumonia    hx of---12+yrs ago    Past Surgical History:  Procedure Laterality Date  . ANTERIOR CERVICAL DECOMP/DISCECTOMY FUSION  01/31/2012   Procedure: ANTERIOR CERVICAL DECOMPRESSION/DISCECTOMY FUSION 1 LEVEL;  Surgeon: Floyce Stakes, MD;  Location: [facility];  Service: Neurosurgery;  Laterality: N/A;  Cervical Five-Six  Anterior Cervical Decompression /Diskectomy /Fusion/Plate  . BONE MARROW TRANSPLANT    . CARDIAC CATHETERIZATION  2010/2013   06/07/11 [facility]) LAD 25%, D2 75% (small), pLCx 100%, RCA 25%--medical managment   . COLONOSCOPY    . HERNIA REPAIR  2006   x 3   . salivary gland removed     left   . skin cancer removed from left side of face     basal cell carcinoma  . vein removed from left leg      There were no vitals filed for this visit.   Subjective Assessment - 11/18/19 1108    Subjective Pt reports his back remains sore, but it is not as intense.  He has a goal of 3000 steps right now; not ready to move it up yet due to back pain.  No longer has chest pain; just tender to touch.    Pertinent History Multiple myeloma in remission, transplant 2015 (bone marrow), he has had bone scan/petscan/mammogram/MRI to rule out cancer;    Patient Stated Goals elimination of pain (when he moves and gets up out of bed)    Currently in Pain? Yes  Pain Score 4     Pain Location Back    Pain Orientation Mid;Lower    Pain Descriptors / Indicators Sore    Pain Onset More than a month ago    Aggravating Factors  prolonged standing    Pain Relieving Factors medication              [facility] PT Assessment - 11/18/19 0001      Assessment   Medical Diagnosis chest wall pain, mid back pain    Referring Provider (PT) Aundria Mems, MD    Onset Date/Surgical Date 03/17/19      Strength   Right Hip Flexion 5/5    Right Hip ABduction 4-/5    Left Hip Flexion 4+/5    Left Hip ABduction 4+/5           [facility] Adult PT Treatment/Exercise - 11/18/19 0001      Lumbar Exercises: Stretches   Passive Hamstring Stretch Right;Left;2 reps;30 seconds   foot on foot stool.    Other Lumbar Stretch Exercise star gazer stretch x 30 sec;  supine butterfly x 30 sec; midlevel doorway stretch x 30 sec x 2;  lat/L  stretch x 20 sec x 2 reps 2 sets during session    Other Lumbar Stretch Exercise supine butterfly stretch x 30 sec; supine lumbar rocking with lateral hip stretch x 3 reps each side.       Lumbar Exercises: Aerobic   Tread Mill up to 1.8 mph x 5 minutes for warm up    Other Aerobic Exercise single laps around gym to assess response to exercises       Lumbar Exercises: Standing   Functional Squats 10 reps    Functional Squats Limitations 5 lbs with functional squat picking up kettle bell from end of mat    Other Standing Lumbar Exercises slow motion marches for core engagement x 12       Lumbar Exercises: Supine   Straight Leg Raise 10 reps   LLE     Lumbar Exercises: Sidelying   Hip Abduction Right;Left;10 reps      Lumbar Exercises: Prone   Straight Leg Raise 10 reps   Rt/LT            PT Long Term Goals - 11/18/19 1115      PT LONG TERM GOAL #1   Title The patient will be indep with HEP for posture, LE strength, general conditioning and flexibility.    Time 6    Period Weeks    Status On-going      PT LONG TERM GOAL #2   Title The patient will improve FOTO limitation from 57% limitation to 47% limitation.    Time 6    Period Weeks    Status On-going      PT LONG TERM GOAL #3   Title The patient will improve bilateral hip flexor strength to 5/5.    Time 6    Period Weeks    Status Partially Met      PT LONG TERM GOAL #4   Title The patient will report pain < or equal to 4/10 in mid and low back.    Baseline 3-4/10 at rest    Time 6    Period Weeks    Status Achieved      PT LONG TERM GOAL #5   Title The patient will improve AROM spine to reach to shins without knee flexion.    Time 6    Period  Weeks    Status Partially Met                 Plan - 11/18/19 1141    Clinical Impression Statement Pt reported some increase in LBP with squats lifting 5# wt (held away from chest).  Otherwise pt reported improved back pain during all other exercises.  Pt  demonstrated improved hip strength.  Pt has met LTG#4.    Rehab Potential Good    PT Frequency 2x / week    PT Duration 6 weeks    PT Treatment/Interventions ADLs/Self Care Home Management;Gait training;Stair training;Functional mobility training;Therapeutic activities;Therapeutic exercise;Balance training;Neuromuscular re-education;Patient/family education;Taping;Manual techniques;Moist Heat;Electrical Stimulation;Iontophoresis 4mg /ml Dexamethasone;Aquatic Therapy    PT Next Visit Plan  hip strengthening, work on LE and lumbar flexibility, postural strengthening - MD NOTE   PT Home Exercise Plan Access Code: [email or web address]    Consulted and Agree with Plan of Care Patient           Patient will benefit from skilled therapeutic intervention in order to improve the following deficits and impairments:  Decreased range of motion, Pain, Hypomobility, Postural dysfunction, Impaired flexibility, Decreased strength, Decreased mobility, Increased fascial restricitons, Decreased activity tolerance  Visit Diagnosis: Muscle weakness (generalized)  Chronic bilateral low back pain, unspecified whether sciatica present  Abnormal posture  Other symptoms and signs involving the musculoskeletal system     Problem List Patient Active Problem List   Diagnosis Date Noted  . Pain of left breast 04/30/2019  . Ingrown toenail of right foot 10/08/2018  . Hypothyroidism 01/08/2018  . Ganglion cyst of left foot 10/19/2017  . Slurring of speech 10/06/2017  . Bronchiectasis without complication ([place]) 16/96/7893  . Chest wall pain 12/07/2016  . Dyspnea 04/19/2016  . Renal cyst 04/05/2016  . Lytic bone lesions on xray 12/11/2015  . Other specified abnormal immunological findings in serum 05/11/2015  . Primary osteoarthritis of both knees 05/05/2015  . Long QT interval 04/01/2015  . Encounter for examination for normal comparison or control in clinical research program 03/24/2015  . History of organ or tissue  transplant 03/18/2015  . Spondylosis of thoracolumbar region without myelopathy or radiculopathy 12/24/2014  . Solitary pulmonary nodule 12/11/2014  . Peripheral neuropathy 08/15/2014  . Insomnia 08/15/2014  . Multiple myeloma ([place]) 08/13/2014  . Primary osteoarthritis of left shoulder 06/18/2014  . Presence of coronary angioplasty implant and graft 05/16/2014  . Obstructive sleep apnea 12/27/2013  . GERD (gastroesophageal reflux disease) 12/27/2013  . Diabetes mellitus, type 2 ([place]) 11/12/2013  . Pulmonary emphysema ([place]) 11/12/2013  . CAD S/P percutaneous coronary angioplasty 05/10/2013  . Bundle branch block, right 05/08/2013  . Hemorrhoids 03/19/2013  . Degenerative disc disease, cervical 02/19/2013  . Essential hypertension, benign 02/19/2013  . History of pulmonary embolism 02/19/2013  . Hyperlipidemia 02/19/2013  . Right bundle branch block 02/19/2013  . Asthma with COPD ([place]) 02/19/2013  . Pre-operative cardiovascular examination 02/19/2013  . Iron deficiency anemia 02/19/2013  . Pulmonary embolism ([place]) 11/01/2012  . Benign prostatic hyperplasia with urinary obstruction 10/18/2012   Kerin Perna, PTA 11/18/19 1:21 PM  [facility] [place], [place], [address] Phone: [phone]   Fax:  [phone]  Name: [name] [name] MRN: [id number] Date of Birth: 1940-07-14

## 2019-11-21 ENCOUNTER — Ambulatory Visit (INDEPENDENT_AMBULATORY_CARE_PROVIDER_SITE_OTHER): Payer: Medicare Other | Admitting: Physical Therapy

## 2019-11-21 ENCOUNTER — Other Ambulatory Visit: Payer: Self-pay

## 2019-11-21 ENCOUNTER — Ambulatory Visit (INDEPENDENT_AMBULATORY_CARE_PROVIDER_SITE_OTHER): Payer: Medicare Other | Admitting: Sports Medicine

## 2019-11-21 DIAGNOSIS — R0789 Other chest pain: Secondary | ICD-10-CM

## 2019-11-21 DIAGNOSIS — R29898 Other symptoms and signs involving the musculoskeletal system: Secondary | ICD-10-CM

## 2019-11-21 DIAGNOSIS — M545 Low back pain, unspecified: Secondary | ICD-10-CM

## 2019-11-21 DIAGNOSIS — R293 Abnormal posture: Secondary | ICD-10-CM

## 2019-11-21 DIAGNOSIS — I2510 Atherosclerotic heart disease of native coronary artery without angina pectoris: Secondary | ICD-10-CM | POA: Diagnosis not present

## 2019-11-21 DIAGNOSIS — M6281 Muscle weakness (generalized): Secondary | ICD-10-CM

## 2019-11-21 DIAGNOSIS — Z9861 Coronary angioplasty status: Secondary | ICD-10-CM | POA: Diagnosis not present

## 2019-11-21 DIAGNOSIS — G8929 Other chronic pain: Secondary | ICD-10-CM

## 2019-11-21 DIAGNOSIS — Z23 Encounter for immunization: Secondary | ICD-10-CM | POA: Diagnosis not present

## 2019-11-21 DIAGNOSIS — M47815 Spondylosis without myelopathy or radiculopathy, thoracolumbar region: Secondary | ICD-10-CM | POA: Diagnosis not present

## 2019-11-21 MED ORDER — NALOXEGOL OXALATE 25 MG PO TABS: 25.0000 mg | ORAL_TABLET | Freq: Every day | ORAL | 3 refills | Status: DC

## 2019-11-21 NOTE — Patient Instructions (Signed)
Naloxegol oral tablets What is this medicine? NALOXEGOL (nal OX e GAHL) is used to treat constipation caused by opioids (pain medicine). Tell your health care professional if you stop taking your opioid pain medicine. This medicine may be used for other purposes; ask your health care provider or pharmacist if you have questions. COMMON BRAND NAME(S): MOVANTIK What should I tell my health care provider before I take this medicine? They need to know if you have any of these conditions:  cancer or tumor in abdomen, intestine, or stomach  diverticulitis  history of bowel blockage  inflammatory bowel disease  kidney disease  liver disease  recent surgery on the stomach or intestine  stomach or intestine problems  taking bevacizumab  an unusual or allergic reaction to naloxegol, other medicines, foods, dyes, or preservatives  pregnant or trying to get pregnant  breast-feeding How should I use this medicine? Take this medicine by mouth with a glass of water. Follow the directions on the prescription label. Swallow medicine whole. Do not cut or chew this medicine. The medicine may also be crushed and mixed with 4 ounces of water. Drink immediately after mixing. Take your medicine one time each day, on an empty stomach, at least 1 hour before your first meal of the day or 2 hours after the meal. If you are unable to tolerate your dose, inform your healthcare provider so that your dose can be adjusted. Do not take additional laxatives except on your healthcare provider's advice. Your healthcare provider may prescribe other laxatives if your medicine does not work well enough after 3 days of treatment. Tell your healthcare provider if you stop taking your pain medicine. If you stop taking your pain medicine, you should also stop taking this medicine. Do not take your medicine more often than directed. Do not stop taking except on your doctor's advice. A special MedGuide will be given to you  before each treatment. Be sure to read this information carefully each time. Talk to your pediatrician regarding the use of this medicine in children. Special care may be needed. Overdosage: If you think you have taken too much of this medicine contact a poison control center or emergency room at once. NOTE: This medicine is only for you. Do not share this medicine with others. What if I miss a dose? If you miss a dose, take it as soon as you can. If it is almost time for your next dose, take only that dose. Do not take double or extra doses. What may interact with this medicine? Do not take this medicine with any of the following medications:  certain antiviral medicines for HIV or AIDS  certain medicines for fungal infections like ketoconazole, itraconazole, posaconazole, and voriconazole  chloramphenicol  clarithromycin  conivaptan  dalfopristin; quinupristin  idelalisib  mifepristone  nefazodone  telithromycin This medicine may also interact with the following medications:  bevacizumab  carbamazepine  diltiazem  erythromycin  grapefruit juice  methylnaltrexone  naloxone  naltrexone  rifampin  St. John's Wort  verapamil This list may not describe all possible interactions. Give your health care provider a list of all the medicines, herbs, non-prescription drugs, or dietary supplements you use. Also tell them if you smoke, drink alcohol, or use illegal drugs. Some items may interact with your medicine. What should I watch for while using this medicine? Visit your doctor for regular check ups. Tell your doctor if your symptoms do not get better or if they get worse. If you develop unusually persistent  or worsening abdominal pain, stop taking your medicine and seek medical attention. You may have symptoms of opioid withdrawal during treatment with this medicine. Symptoms include sweating, chills, diarrhea, stomach pain, anxiety, irritability, and yawning. Tell  your health care provider if you have any of these symptoms. Also, if you take methadone to treat your pain, you may be more likely to have stomach pain and diarrhea compared to people who do not take methadone. If you take too much of this medicine, call your healthcare provider or go to the nearest emergency room right away. What side effects may I notice from receiving this medicine? Side effects that you should report to your doctor or health care professional as soon as possible:  allergic reactions like skin rash, itching or hives, swelling of the face, lips, or tongue  new or worsening stomach pain  severe or prolonged diarrhea  signs and symptoms of opioid withdrawal such as sweating, chills, diarrhea, stomach pain, anxiety, irritability, and yawning Side effects that usually do not require medical attention (report to your doctor or health care professional if they continue or are bothersome):  diarrhea  headache  nausea  stomach gas  stomach pain  vomiting This list may not describe all possible side effects. Call your doctor for medical advice about side effects. You may report side effects to FDA at 1-800-FDA-1088. Where should I keep my medicine? Keep out of the reach of children. This medicine can be abused. Keep your medicine in a safe place to protect it from theft. Do not share this medicine with anyone. Selling or giving away this medicine is dangerous and against the law. Follow the directions in the [place]. Store at room temperature between 20 and 25 degrees C (68 and 77 degrees F). Throw away any unused medicine after the expiration date. NOTE: This sheet is a summary. It may not cover all possible information. If you have questions about this medicine, talk to your doctor, pharmacist, or health care provider.  2020 Elsevier/Gold Standard (2018-05-31 17:12:41)

## 2019-11-21 NOTE — Assessment & Plan Note (Signed)
[name] [name] has widespread thoracolumbar degenerative disc disease, widespread facet arthritis, he also has some bilateral foot numbness, likely peripheral neuropathy secondary to his multiple myeloma as well as spinal stenosis. Unfortunately he has had multiple interventions in the form of epidurals, facet blocks, nothing has given him sufficient relief. He is currently on oxycodone, which he does a good job of limiting his use of. His pain however is not fully controlled, and his only limitation in going up on the dose is intolerable opiate-induced constipation. I am going to add Movantik for his opiate-induced constipation, hopefully he can go up to his oxycodone twice daily to 3 times daily to control his pain better.  He has not stooled in 5 days, but he has been doing some laxatives and feels as though it is going to come soon. He does understand he can add some enemas to if needed.

## 2019-11-21 NOTE — Progress Notes (Signed)
    Procedures performed today:    None.  Independent interpretation of notes and tests performed by another provider:   None.  Brief History, Exam, Impression, and Recommendations:    Spondylosis of thoracolumbar region without myelopathy or radiculopathy [name] [name] has widespread thoracolumbar degenerative disc disease, widespread facet arthritis, he also has some bilateral foot numbness, likely peripheral neuropathy secondary to his multiple myeloma as well as spinal stenosis. Unfortunately he has had multiple interventions in the form of epidurals, facet blocks, nothing has given him sufficient relief. He is currently on oxycodone, which he does a good job of limiting his use of. His pain however is not fully controlled, and his only limitation in going up on the dose is intolerable opiate-induced constipation. I am going to add Movantik for his opiate-induced constipation, hopefully he can go up to his oxycodone twice daily to 3 times daily to control his pain better.  He has not stooled in 5 days, but he has been doing some laxatives and feels as though it is going to come soon. He does understand he can add some enemas to if needed.  Chest wall pain Patient was also having some right-sided chest wall pain, right lower costal margin, he had already been seen by oncology, a bone scan, as well as skeletal survey were done that showed no evidence of recurrence of his multiple myeloma in his chest wall bones. We tried a burst of prednisone with plans to do an ultrasound-guided costal margin injection, perichondral if no better, luckily he returns today feeling significantly better.    ___________________________________________ Gwen Her. Dianah Field, M.D., ABFM., CAQSM. Primary Care and [place] [employer] of [place] [facility]

## 2019-11-21 NOTE — Assessment & Plan Note (Signed)
Patient was also having some right-sided chest wall pain, right lower costal margin, he had already been seen by oncology, a bone scan, as well as skeletal survey were done that showed no evidence of recurrence of his multiple myeloma in his chest wall bones. We tried a burst of prednisone with plans to do an ultrasound-guided costal margin injection, perichondral if no better, luckily he returns today feeling significantly better.

## 2019-11-21 NOTE — Therapy (Addendum)
[place], [place], [address] Phone: [phone]   Fax:  [phone]  Physical Therapy Treatment and Discharge Summary   Patient Details  Name: [name] [name] MRN: [id number] Date of Birth: [birth year] Referring Provider (PT): Aundria Mems, MD   Encounter Date: 11/21/2019   PT End of Session - 11/21/19 1016    Visit Number 8    Number of Visits 12    Date for PT Re-Evaluation 12/09/19    Authorization Type medicare/ [employer]    PT Start Time [phone]    PT Stop Time 1012    PT Time Calculation (min) 38 min    Activity Tolerance Patient tolerated treatment well    Behavior During Therapy [facility] for tasks assessed/performed           Past Medical History:  Diagnosis Date  . Acute deep vein thrombosis (DVT) of tibial vein of right lower extremity ([place]) 09/29/2014  . Arthritis    back  . Asthma    uses Advair bid and asmanex prn  . Cancer ([place])    multiple myeloma  . Chronic back pain    buldging disc and scoliosis  . Chronic bronchitis ([place])    couple of yrs ago  . Complication of anesthesia    severe case of hiccups after hernia surgery;required medication  . Diverticulosis   . Dizziness    related to neck issues  . Emphysema   . Enlarged prostate    takes Flomax daily  . GERD (gastroesophageal reflux disease)    takes PRotonix daily  . History of colon polyps   . Hyperlipidemia    takes Zocor daily  . Hypertension    takes HCTZ,Diovan daily and Isosorbide daily  . Itching    down left arm and leg  . Joint pain   . Macular degeneration    mild,beginning  . Multiple myeloma ([place]) 08/13/2014  . Myocardial infarction ([place]) 2010  . Neck pain   . Pain in limb 06/20/2012  . Pneumonia    hx of---12+yrs ago    Past Surgical History:  Procedure Laterality Date  . ANTERIOR CERVICAL DECOMP/DISCECTOMY FUSION  01/31/2012   Procedure: ANTERIOR CERVICAL DECOMPRESSION/DISCECTOMY FUSION 1  LEVEL;  Surgeon: Floyce Stakes, MD;  Location: [facility];  Service: Neurosurgery;  Laterality: N/A;  Cervical Five-Six  Anterior Cervical Decompression /Diskectomy /Fusion/Plate  . BONE MARROW TRANSPLANT    . CARDIAC CATHETERIZATION  2010/2013   06/07/11 [facility]) LAD 25%, D2 75% (small), pLCx 100%, RCA 25%--medical managment   . COLONOSCOPY    . HERNIA REPAIR  2006   x 3   . salivary gland removed     left   . skin cancer removed from left side of face     basal cell carcinoma  . vein removed from left leg      There were no vitals filed for this visit.   Subjective Assessment - 11/21/19 0947    Subjective Pt reports his back is a little more sore than usual.  Didn't feel well yesterday and it was raining yesterday, so he didn't do much activity yesterday.    Pertinent History Multiple myeloma in remission, transplant 2015 (bone marrow), he has had bone scan/petscan/mammogram/MRI to rule out cancer;    Patient Stated Goals elimination of pain (when he moves and gets up out of bed)    Currently in Pain? Yes    Pain Score 6     Pain Location  Back    Pain Orientation Lower;Right;Left    Pain Descriptors / Indicators Sore    Pain Onset More than a month ago              [facility] PT Assessment - 11/21/19 0001      Assessment   Medical Diagnosis chest wall pain, mid back pain    Referring Provider (PT) Aundria Mems, MD    Onset Date/Surgical Date 03/17/19            [facility] Adult PT Treatment/Exercise - 11/21/19 0001      Lumbar Exercises: Stretches   Passive Hamstring Stretch Right;Left;3 reps;30 seconds   hooklying with strap   Single Knee to Chest Stretch Right;Left;1 rep;20 seconds    Lower Trunk Rotation 30 seconds   lumbar rocking, with arms in T   Standing Extension 1 rep;5 seconds    Quad Stretch Right;Left;2 reps;20 seconds   prone with strap   Other Lumbar Stretch Exercise L / modified downward dog with bent knees x 20 sec       Lumbar Exercises: Aerobic     T[place] 1.8 mph x 5 minutes for warm up    Other Aerobic Exercise single laps around gym to assess response to exercises       Lumbar Exercises: Prone   Straight Leg Raise 5 reps;3 seconds   withTA engaged.    Other Prone Lumbar Exercises limited tolerance for prone position today.       Modalities   Modalities Moist Heat      Moist Heat Therapy   Number Minutes Moist Heat 10 Minutes    Moist Heat Location Lumbar Spine;Cervical              PT Long Term Goals - 11/21/19 0949      PT LONG TERM GOAL #1   Title The patient will be indep with HEP for posture, LE strength, general conditioning and flexibility.    Time 6    Period Weeks    Status On-going      PT LONG TERM GOAL #2   Title The patient will improve FOTO limitation from 57% limitation to 47% limitation.    Time 6    Period Weeks    Status On-going      PT LONG TERM GOAL #3   Title The patient will improve bilateral hip flexor strength to 5/5.    Time 6    Period Weeks    Status Partially Met      PT LONG TERM GOAL #4   Title The patient will report pain < or equal to 4/10 in mid and low back.    Baseline 3-4/10 at rest    Time 6    Period Weeks    Status Achieved      PT LONG TERM GOAL #5   Title The patient will improve AROM spine to reach to shins without knee flexion.    Time 6    Period Weeks    Status Partially Met                 Plan - 11/21/19 1013    Clinical Impression Statement Pt presents with elevated low back pain.  Reviewed HEP exercises, emphasizing stretches for low back/ hamstring with neutral spine to avoid aggrivating symptoms.  Pt has partially met his goals and requests to hold therapy until after MD appt.  Pt plans to begin attending gym for exercise this fall.  Examination-Activity Limitations Lift;Locomotion Level;Reach Overhead;Sleep;Squat;Stairs;Stand    Rehab Potential Good    PT Frequency 2x / week    PT Duration 6 weeks    PT Treatment/Interventions  ADLs/Self Care Home Management;Gait training;Stair training;Functional mobility training;Therapeutic activities;Therapeutic exercise;Balance training;Neuromuscular re-education;Patient/family education;Taping;Manual techniques;Moist Heat;Electrical Stimulation;Iontophoresis 4mg /ml Dexamethasone;Aquatic Therapy    PT Next Visit Plan hold therapy per pt.  will add gym equip and additional strengthening exercises if he returns.    PT Home Exercise Plan Access Code: [email or web address]    Consulted and Agree with Plan of Care Patient           Patient will benefit from skilled therapeutic intervention in order to improve the following deficits and impairments:  Decreased range of motion, Pain, Hypomobility, Postural dysfunction, Impaired flexibility, Decreased strength, Decreased mobility, Increased fascial restricitons, Decreased activity tolerance  Visit Diagnosis: Muscle weakness (generalized)  Chronic bilateral low back pain, unspecified whether sciatica present  Abnormal posture  Other symptoms and signs involving the musculoskeletal system     Problem List Patient Active Problem List   Diagnosis Date Noted  . Pain of left breast 04/30/2019  . Ingrown toenail of right foot 10/08/2018  . Hypothyroidism 01/08/2018  . Ganglion cyst of left foot 10/19/2017  . Slurring of speech 10/06/2017  . Bronchiectasis without complication ([place]) 14/43/1540  . Chest wall pain 12/07/2016  . Dyspnea 04/19/2016  . Renal cyst 04/05/2016  . Lytic bone lesions on xray 12/11/2015  . Other specified abnormal immunological findings in serum 05/11/2015  . Primary osteoarthritis of both knees 05/05/2015  . Long QT interval 04/01/2015  . Encounter for examination for normal comparison or control in clinical research program 03/24/2015  . History of organ or tissue transplant 03/18/2015  . Spondylosis of thoracolumbar region without myelopathy or radiculopathy 12/24/2014  . Solitary pulmonary nodule 12/11/2014  .  Peripheral neuropathy 08/15/2014  . Insomnia 08/15/2014  . Multiple myeloma ([place]) 08/13/2014  . Primary osteoarthritis of left shoulder 06/18/2014  . Presence of coronary angioplasty implant and graft 05/16/2014  . Obstructive sleep apnea 12/27/2013  . GERD (gastroesophageal reflux disease) 12/27/2013  . Diabetes mellitus, type 2 ([place]) 11/12/2013  . Pulmonary emphysema ([place]) 11/12/2013  . CAD S/P percutaneous coronary angioplasty 05/10/2013  . Bundle branch block, right 05/08/2013  . Hemorrhoids 03/19/2013  . Degenerative disc disease, cervical 02/19/2013  . Essential hypertension, benign 02/19/2013  . History of pulmonary embolism 02/19/2013  . Hyperlipidemia 02/19/2013  . Right bundle branch block 02/19/2013  . Asthma with COPD ([place]) 02/19/2013  . Pre-operative cardiovascular examination 02/19/2013  . Iron deficiency anemia 02/19/2013  . Pulmonary embolism ([place]) 11/01/2012  . Benign prostatic hyperplasia with urinary obstruction 10/18/2012    PHYSICAL THERAPY DISCHARGE SUMMARY  Visits from Start of Care: 8  Current functional level related to goals / functional outcomes: See goals above   Remaining deficits: Patient was working on HEP to continue post d/c and return to gym   Education / Equipment: HEP and return to gym  Plan: Patient agrees to discharge.  Patient goals were partially met. Patient is being discharged due to meeting the stated rehab goals.  ?????         Thank you for the referral of this patient. Rudell Cobb, MPT  [place], PTA 11/21/19 10:20 AM  [facility] [place], [place], [address] Phone: [phone]   Fax:  [phone]  Name: [name] [name] MRN: [id number] Date of Birth: August 19, 1940

## 2019-11-27 ENCOUNTER — Inpatient Hospital Stay: Payer: Medicare Other

## 2019-11-27 ENCOUNTER — Other Ambulatory Visit: Payer: Self-pay

## 2019-11-27 ENCOUNTER — Encounter: Payer: Self-pay | Admitting: Hematology & Oncology

## 2019-11-27 ENCOUNTER — Inpatient Hospital Stay: Payer: Medicare Other | Attending: Hematology & Oncology | Admitting: Hematology & Oncology

## 2019-11-27 VITALS — BP 133/67 | HR 55 | Temp 97.6°F | Resp 18 | Wt 194.0 lb

## 2019-11-27 DIAGNOSIS — Z7901 Long term (current) use of anticoagulants: Secondary | ICD-10-CM | POA: Diagnosis not present

## 2019-11-27 DIAGNOSIS — C9001 Multiple myeloma in remission: Secondary | ICD-10-CM | POA: Insufficient documentation

## 2019-11-27 DIAGNOSIS — C9002 Multiple myeloma in relapse: Secondary | ICD-10-CM

## 2019-11-27 DIAGNOSIS — C9000 Multiple myeloma not having achieved remission: Secondary | ICD-10-CM | POA: Diagnosis not present

## 2019-11-27 DIAGNOSIS — Z9861 Coronary angioplasty status: Secondary | ICD-10-CM | POA: Diagnosis not present

## 2019-11-27 DIAGNOSIS — I2510 Atherosclerotic heart disease of native coronary artery without angina pectoris: Secondary | ICD-10-CM

## 2019-11-27 LAB — CBC WITH DIFFERENTIAL (CANCER CENTER ONLY)
Abs Immature Granulocytes: 0.01 10*3/uL (ref 0.00–0.07)
Basophils Absolute: 0 10*3/uL (ref 0.0–0.1)
Basophils Relative: 1 %
Eosinophils Absolute: 0.1 10*3/uL (ref 0.0–0.5)
Eosinophils Relative: 2 %
HCT: 42.2 % (ref 39.0–52.0)
Hemoglobin: 13.8 g/dL (ref 13.0–17.0)
Immature Granulocytes: 0 %
Lymphocytes Relative: 33 %
Lymphs Abs: 1.9 10*3/uL (ref 0.7–4.0)
MCH: 29.4 pg (ref 26.0–34.0)
MCHC: 32.7 g/dL (ref 30.0–36.0)
MCV: 90 fL (ref 80.0–100.0)
Monocytes Absolute: 0.4 10*3/uL (ref 0.1–1.0)
Monocytes Relative: 7 %
Neutro Abs: 3.4 10*3/uL (ref 1.7–7.7)
Neutrophils Relative %: 57 %
Platelet Count: 173 10*3/uL (ref 150–400)
RBC: 4.69 MIL/uL (ref 4.22–5.81)
RDW: 13.4 % (ref 11.5–15.5)
WBC Count: 5.8 10*3/uL (ref 4.0–10.5)
nRBC: 0 % (ref 0.0–0.2)

## 2019-11-27 LAB — CMP (CANCER CENTER ONLY)
ALT: 22 U/L (ref 0–44)
AST: 21 U/L (ref 15–41)
Albumin: 4.2 g/dL (ref 3.5–5.0)
Alkaline Phosphatase: 85 U/L (ref 38–126)
Anion gap: 8 (ref 5–15)
BUN: 14 mg/dL (ref 8–23)
CO2: 31 mmol/L (ref 22–32)
Calcium: 9.8 mg/dL (ref 8.9–10.3)
Chloride: 103 mmol/L (ref 98–111)
Creatinine: 1.22 mg/dL (ref 0.61–1.24)
GFR, Estimated: 56 mL/min — ABNORMAL LOW (ref >60)
Glucose, Bld: 123 mg/dL — ABNORMAL HIGH (ref 70–99)
Potassium: 3.8 mmol/L (ref 3.5–5.1)
Sodium: 142 mmol/L (ref 135–145)
Total Bilirubin: 0.7 mg/dL (ref 0.3–1.2)
Total Protein: 6.5 g/dL (ref 6.5–8.1)

## 2019-11-27 NOTE — Progress Notes (Signed)
Hematology and Oncology Follow Up Visit  [name] [name] [id number] 10-Jul-1940 79 y.o. 11/27/2019   Principle Diagnosis:  IgG kappa myeloma - Trisomy 11 by FISH Acute thromboembolism of the right lower leg  Past Therapy: S/P ASCT at [facility] on 04/02/2015 Patient s/p cycle 5 of Velcade/Revlimid/Decadron Revlimid 10mg  po q day (21/28) -- d/c on 09/15/2017  Current Therapy:        Zometa 4 mg IV every3 months - next dose due 01/2020 Xarelto 10 mg by mouth daily   Interim History:  [name] [name] is here today for follow-up.  He is doing quite well.  He is feeling pretty well.  He is exercising a little more.  When he last saw him, he did bring in a 24-hour urine.  Thankfully, there was no monoclonal protein in the urine.  His kappa light chain production was 41.2 mg/L.  His last monoclonal studies with blood work showed an M spike of 0.9 g/dL.  His IgG level was 890 mg/dL.  His back is doing better.  He did some physical therapy which seemed to help.  He has had no issues with nausea or vomiting.  He is trying to watch what he eats.  He is losing a little bit of weight which he is happy about.  There is been no problems with rashes.  He has had no leg swelling.  He has had no fever.  Overall, his performance status is ECOG 1.    Medications:  Allergies as of 11/27/2019      Reactions   Budesonide-formoterol Fumarate Itching, Other (See Comments)   Codeine Itching   Hydrocodone-acetaminophen Itching   Mushroom Extract Complex Nausea And Vomiting, Other (See Comments)   Only shitake mushroom  causes this reaction. Flu-like symptoms from portabello mushrooms      Medication List       Accurate as of November 27, 2019  2:19 PM. If you have any questions, ask your nurse or doctor.        STOP taking these medications   ezetimibe 10 MG tablet Commonly known as: ZETIA Stopped by: [name], MD     TAKE these medications   amLODipine 5 MG tablet Commonly  known as: NORVASC TAKE 1 TABLET DAILY   aspirin EC 81 MG tablet Take by mouth.   carvedilol 6.25 MG tablet Commonly known as: COREG TAKE 1 TABLET TWICE DAILY  WITH MEALS   cetirizine 10 MG tablet Commonly known as: EQ Allergy Relief (Cetirizine) Take 1 tablet (10 mg total) by mouth daily.   esomeprazole 40 MG capsule Commonly known as: NEXIUM TAKE 1 CAPSULE DAILY AT 12 NOON   famciclovir 500 MG tablet Commonly known as: FAMVIR TAKE 1 TABLET DAILY   fluticasone 50 MCG/ACT nasal spray Commonly known as: FLONASE Place 2 sprays into both nostrils daily.   gabapentin 600 MG tablet Commonly known as: NEURONTIN TAKE 1 TABLET EVERY MORNINGAND 2 TABLETS EVERY NIGHT   hydrochlorothiazide 12.5 MG capsule Commonly known as: MICROZIDE TAKE 1 CAPSULE DAILY   isosorbide mononitrate 30 MG 24 hr tablet Commonly known as: IMDUR TAKE 1 TABLET TWICE A DAY   Klor-Con M20 20 MEQ tablet Generic drug: potassium chloride SA TAKE 1 TABLET TWICE A DAY   levothyroxine 100 MCG tablet Commonly known as: Synthroid Take 1 tablet (100 mcg total) by mouth daily before breakfast.   LORazepam 1 MG tablet Commonly known as: ATIVAN Take 1 tablet (1 mg total) by mouth at bedtime.   naloxegol oxalate  25 MG Tabs tablet Commonly known as: Movantik Take 1 tablet (25 mg total) by mouth daily.   nitroGLYCERIN 0.4 MG SL tablet Commonly known as: NITROSTAT Place 1 tablet (0.4 mg total) under the tongue every 5 (five) minutes as needed. For chest pain   oxyCODONE-acetaminophen 10-325 MG tablet Commonly known as: PERCOCET Take 1 tablet by mouth every 8 (eight) hours as needed.   predniSONE 50 MG tablet Commonly known as: DELTASONE One tab PO daily for 5 days.   pyridoxine 100 MG tablet Commonly known as: B-6 Take 100 mg by mouth daily.   RA Vitamin B-12 TR 1000 MCG Tbcr Generic drug: Cyanocobalamin Take 1 tablet by mouth daily.   rivaroxaban 10 MG Tabs tablet Commonly known as:  XARELTO Take 1 tablet (10 mg total) by mouth daily with supper.   tamsulosin 0.4 MG Caps capsule Commonly known as: FLOMAX TAKE 1 CAPSULE DAILY AFTER BREAKFAST       Allergies:  Allergies  Allergen Reactions  . Budesonide-Formoterol Fumarate Itching and Other (See Comments)  . Codeine Itching  . Hydrocodone-Acetaminophen Itching  . Mushroom Extract Complex Nausea And Vomiting and Other (See Comments)    Only shitake mushroom  causes this reaction. Flu-like symptoms from portabello mushrooms    Past Medical History, Surgical history, Social history, and Family History were reviewed and updated.  Review of Systems: Review of Systems  HENT: Negative.   Eyes: Negative.   Respiratory: Negative.   Cardiovascular: Positive for chest pain.  Gastrointestinal: Negative.   Genitourinary: Negative.   Musculoskeletal: Positive for back pain.  Skin: Negative.   Neurological: Negative.   Endo/Heme/Allergies: Negative.   Psychiatric/Behavioral: Negative.      Physical Exam:  weight is 194 lb (88 kg). His oral temperature is 97.6 F (36.4 C). His blood pressure is 133/67 and his pulse is 55 (abnormal). His respiration is 18 and oxygen saturation is 99%.   Wt Readings from Last 3 Encounters:  11/27/19 194 lb (88 kg)  10/30/19 197 lb 4 oz (89.5 kg)  10/23/19 199 lb (90.3 kg)    Physical Exam Vitals reviewed.  HENT:     Head: Normocephalic and atraumatic.  Eyes:     Pupils: Pupils are equal, round, and reactive to light.  Cardiovascular:     Rate and Rhythm: Normal rate and regular rhythm.     Heart sounds: Normal heart sounds.  Pulmonary:     Effort: Pulmonary effort is normal.     Breath sounds: Normal breath sounds.  Abdominal:     General: Bowel sounds are normal.     Palpations: Abdomen is soft.  Musculoskeletal:        General: No tenderness or deformity. Normal range of motion.     Cervical back: Normal range of motion.  Lymphadenopathy:     Cervical: No  cervical adenopathy.  Skin:    General: Skin is warm and dry.     Findings: No erythema or rash.  Neurological:     Mental Status: He is alert and oriented to person, place, and time.  Psychiatric:        Behavior: Behavior normal.        Thought Content: Thought content normal.        Judgment: Judgment normal.      Lab Results  Component Value Date   WBC 5.8 11/27/2019   HGB 13.8 11/27/2019   HCT 42.2 11/27/2019   MCV 90.0 11/27/2019   PLT 173 11/27/2019   Lab Results  Component Value Date   FERRITIN 199 07/18/2019   IRON 65 07/18/2019   TIBC 316 07/18/2019   UIBC 251 07/18/2019   IRONPCTSAT 21 07/18/2019   Lab Results  Component Value Date   RETICCTPCT 1.7 07/18/2019   RBC 4.69 11/27/2019   Lab Results  Component Value Date   KPAFRELGTCHN 32.3 (H) 10/30/2019   LAMBDASER 4.4 (L) 10/30/2019   KAPLAMBRATIO 18.00 11/04/2019   Lab Results  Component Value Date   IGGSERUM 892 10/30/2019   IGA 58 (L) 10/30/2019   IGMSERUM 45 10/30/2019   Lab Results  Component Value Date   TOTALPROTELP 6.4 10/30/2019   ALBUMINELP 3.7 10/30/2019   A1GS 0.2 10/30/2019   A2GS 0.7 10/30/2019   BETS 0.9 10/30/2019   BETA2SER 0.3 02/11/2015   GAMS 0.9 10/30/2019   MSPIKE Not Observed 10/30/2019   SPEI Comment 10/30/2019     Chemistry      Component Value Date/Time   NA 142 11/27/2019 1302   NA 145 (H) 06/28/2019 1501   NA 140 01/27/2017 1044   NA 141 06/05/2015 1036   K 3.8 11/27/2019 1302   K 3.3 01/27/2017 1044   K 2.9 (LL) 06/05/2015 1036   CL 103 11/27/2019 1302   CL 101 01/27/2017 1044   CO2 31 11/27/2019 1302   CO2 28 01/27/2017 1044   CO2 27 06/05/2015 1036   BUN 14 11/27/2019 1302   BUN 15 06/28/2019 1501   BUN 14 01/27/2017 1044   BUN 13.1 06/05/2015 1036   CREATININE 1.22 11/27/2019 1302   CREATININE 1.16 10/08/2018 1531   CREATININE 1.0 06/05/2015 1036   GLU 152 03/30/2016 0000      Component Value Date/Time   CALCIUM 9.8 11/27/2019 1302   CALCIUM  8.9 01/27/2017 1044   CALCIUM 9.4 06/05/2015 1036   ALKPHOS 85 11/27/2019 1302   ALKPHOS 126 (H) 01/27/2017 1044   ALKPHOS 94 06/05/2015 1036   AST 21 11/27/2019 1302   AST 22 06/05/2015 1036   ALT 22 11/27/2019 1302   ALT 33 01/27/2017 1044   ALT 18 06/05/2015 1036   BILITOT 0.7 11/27/2019 1302   BILITOT 0.59 06/05/2015 1036       Impression and Plan: Mr. Grismer is a very pleasant 79 yo caucasian gentleman with history of IgG kappa myeloma.  He underwent standard induction chemotherapy and then ultimately stem cell transplant at [facility] in February 2017.   I do actually worry about him having a relapse of the myeloma.  It is been almost 5  years since transplant.  I am just happy that his quality life is doing better.  Hopefully, he will be able to enjoy the fall.  He may go to [place] for Thanksgiving.  His wife doing pretty well herself.  She is from [place].  She does have family down there still.  Thankfully, I do not think that they have had issues with the coronavirus where they live.      [name], MD 10/13/20212:19 PM

## 2019-11-28 LAB — PROTEIN ELECTROPHORESIS, SERUM, WITH REFLEX
A/G Ratio: 1.4 (ref 0.7–1.7)
Albumin ELP: 3.6 g/dL (ref 2.9–4.4)
Alpha-1-Globulin: 0.2 g/dL (ref 0.0–0.4)
Alpha-2-Globulin: 0.8 g/dL (ref 0.4–1.0)
Beta Globulin: 0.9 g/dL (ref 0.7–1.3)
Gamma Globulin: 0.8 g/dL (ref 0.4–1.8)
Globulin, Total: 2.6 g/dL (ref 2.2–3.9)
Total Protein ELP: 6.2 g/dL (ref 6.0–8.5)

## 2019-11-28 LAB — KAPPA/LAMBDA LIGHT CHAINS
Kappa free light chain: 56.2 mg/L — ABNORMAL HIGH (ref 3.3–19.4)
Kappa, lambda light chain ratio: 8.03 — ABNORMAL HIGH (ref 0.26–1.65)
Lambda free light chains: 7 mg/L (ref 5.7–26.3)

## 2019-11-28 LAB — IGG, IGA, IGM
IgA: 56 mg/dL — ABNORMAL LOW (ref 61–437)
IgG (Immunoglobin G), Serum: 837 mg/dL (ref 603–1613)
IgM (Immunoglobulin M), Srm: 42 mg/dL (ref 15–143)

## 2019-11-28 LAB — BETA 2 MICROGLOBULIN, SERUM: Beta-2 Microglobulin: 2.6 mg/L — ABNORMAL HIGH (ref 0.6–2.4)

## 2019-11-28 LAB — LACTATE DEHYDROGENASE: LDH: 146 U/L (ref 98–192)

## 2019-12-19 ENCOUNTER — Ambulatory Visit (INDEPENDENT_AMBULATORY_CARE_PROVIDER_SITE_OTHER): Payer: Medicare Other | Admitting: Sports Medicine

## 2019-12-19 DIAGNOSIS — I2510 Atherosclerotic heart disease of native coronary artery without angina pectoris: Secondary | ICD-10-CM | POA: Diagnosis not present

## 2019-12-19 DIAGNOSIS — K5903 Drug induced constipation: Secondary | ICD-10-CM

## 2019-12-19 DIAGNOSIS — T402X5A Adverse effect of other opioids, initial encounter: Secondary | ICD-10-CM

## 2019-12-19 DIAGNOSIS — M47815 Spondylosis without myelopathy or radiculopathy, thoracolumbar region: Secondary | ICD-10-CM

## 2019-12-19 DIAGNOSIS — Z9861 Coronary angioplasty status: Secondary | ICD-10-CM | POA: Diagnosis not present

## 2019-12-19 MED ORDER — OXYCODONE-ACETAMINOPHEN 10-325 MG PO TABS: 1.0000 | ORAL_TABLET | Freq: Three times a day (TID) | ORAL | 0 refills | Status: DC | PRN

## 2019-12-19 MED ORDER — LUBIPROSTONE 24 MCG PO CAPS: ORAL_CAPSULE | ORAL | 11 refills | Status: DC

## 2019-12-19 MED ORDER — MINERAL OIL RE ENEM: 1.0000 | ENEMA | Freq: Once | RECTAL | 0 refills | Status: AC

## 2019-12-19 NOTE — Assessment & Plan Note (Signed)
[name] [name] returns, he is a very pleasant 79 year old male, we have gained good control over his back pain with oxycodone 2-3 times a day, he has had multiple interventions without significant efficacy so we have decided to adopt more of a medical approach. Unfortunately at 3 times a day he has good relief of his pain but intolerable opiate-induced constipation, and is likely somewhat impacted right now. We added Movantik at the last visit which seemed to work really well but unfortunately he is in the donut hole and its over $90 a month so is going to be nonsustainable until the end of the year. I am going to have him do a mineral oil enema to treat his impaction, and add lubiprostone (Amitiza) for OIC, starting with high-dose but with the option to drop to 8 if causing diarrhea.  If Amitiza is too expensive we will try Senokot S, 2 tabs twice a day as well as MiraLAX.  There is certainly the option of using naltrexone, some is absorbed and we would likely need a higher dose of opiates for pain control, but a good amount continues to stay in the GI tract and block the opiate-induced constipation.

## 2019-12-19 NOTE — Progress Notes (Signed)
    Procedures performed today:    None.  Independent interpretation of notes and tests performed by another provider:   None.  Brief History, Exam, Impression, and Recommendations:    Therapeutic opioid induced constipation Antoino returns, he is a very pleasant 79 year old male, we have gained good control over his back pain with oxycodone 2-3 times a day, he has had multiple interventions without significant efficacy so we have decided to adopt more of a medical approach. Unfortunately at 3 times a day he has good relief of his pain but intolerable opiate-induced constipation, and is likely somewhat impacted right now. We added Movantik at the last visit which seemed to work really well but unfortunately he is in the donut hole and its over $90 a month so is going to be nonsustainable until the end of the year. I am going to have him do a mineral oil enema to treat his impaction, and add lubiprostone (Amitiza) for OIC, starting with high-dose but with the option to drop to 8 if causing diarrhea.  If Amitiza is too expensive we will try Senokot S, 2 tabs twice a day as well as MiraLAX.  There is certainly the option of using naltrexone, some is absorbed and we would likely need a higher dose of opiates for pain control, but a good amount continues to stay in the GI tract and block the opiate-induced constipation.     ___________________________________________ Gwen Her. Dianah Field, M.D., ABFM., CAQSM. Primary Care and [place] Instructor of [place] of [facility] of Medicine

## 2019-12-19 NOTE — Patient Instructions (Signed)
mineral oil enema to treat his impaction, and add lubiprostone (Amitiza) for OIC, starting with high-dose but with the option to drop to 8 if causing diarrhea.  If Amitiza is too expensive we will try Senokot S, 2 tabs twice a day as well as MiraLAX.

## 2019-12-23 DIAGNOSIS — Z20822 Contact with and (suspected) exposure to covid-19: Secondary | ICD-10-CM | POA: Diagnosis not present

## 2019-12-31 ENCOUNTER — Telehealth (INDEPENDENT_AMBULATORY_CARE_PROVIDER_SITE_OTHER): Payer: Medicare Other | Admitting: Sports Medicine

## 2019-12-31 DIAGNOSIS — J069 Acute upper respiratory infection, unspecified: Secondary | ICD-10-CM | POA: Diagnosis not present

## 2019-12-31 DIAGNOSIS — Z9861 Coronary angioplasty status: Secondary | ICD-10-CM

## 2019-12-31 DIAGNOSIS — I2510 Atherosclerotic heart disease of native coronary artery without angina pectoris: Secondary | ICD-10-CM | POA: Diagnosis not present

## 2019-12-31 MED ORDER — BENZONATATE 200 MG PO CAPS: 200.0000 mg | ORAL_CAPSULE | Freq: Three times a day (TID) | ORAL | 0 refills | Status: DC | PRN

## 2019-12-31 NOTE — Progress Notes (Signed)
   Virtual Visit via WebEx/MyChart   I connected with  [name] [name]  on 12/31/19 via WebEx/MyChart/Doximity Video and verified that I am speaking with the correct person using two identifiers.   I discussed the limitations, risks, security and privacy concerns of performing an evaluation and management service by WebEx/MyChart/Doximity Video, including the higher likelihood of inaccurate diagnosis and treatment, and the availability of in person appointments.  We also discussed the likely need of an additional face to face encounter for complete and high quality delivery of care.  I also discussed with the patient that there may be a patient responsible charge related to this service. The patient expressed understanding and wishes to proceed.  Provider location is in medical facility. Patient location is at their home, different from provider location. People involved in care of the patient during this telehealth encounter were myself, my nurse/medical assistant, and my front office/scheduling team member.  Review of Systems: No fevers, chills, night sweats, weight loss, chest pain, or shortness of breath.   Objective Findings:    General: Speaking full sentences, no audible heavy breathing.  Sounds alert and appropriately interactive.  Appears well.  Face symmetric.  Extraocular movements intact.  Pupils equal and round.  No nasal flaring or accessory muscle use visualized.  Independent interpretation of tests performed by another provider:   None.  Brief History, Exam, Impression, and Recommendations:    Viral URI with cough This is a pleasant 79 year old male, he is post bone marrow transplant for multiple myeloma, currently in remission. He has had all 3 of his COVID-19 vaccines, over the past week he developed a sore throat, cough, no constitutional symptoms, he had a mild amount of facial pressure that improved and then worsened but overall everything is getting better now. He is  speaking full sentences, I think he is just at the tail end of a cold. Adding some Tessalon Perles because his cough is the worst symptom, and if persistent discomfort over another week we will going get some chest imaging and consider antibiotics.   I discussed the above assessment and treatment plan with the patient. The patient was provided an opportunity to ask questions and all were answered. The patient agreed with the plan and demonstrated an understanding of the instructions.   The patient was advised to call back or seek an in-person evaluation if the symptoms worsen or if the condition fails to improve as anticipated.   I provided 30 minutes of face to face and non-face-to-face time during this encounter date, time was needed to gather information, review chart, records, communicate/coordinate with staff remotely, as well as complete documentation.   ___________________________________________ Gwen Her. Dianah Field, M.D., ABFM., CAQSM. Primary Care and [place] Instructor of [place] [facility] of [facility]

## 2019-12-31 NOTE — Progress Notes (Signed)
Patient reports chest and nasal congestion, cough, sore throat. Clear to green mucus.  COVID test negative.  OTC meds used: severe Cold and flu pills (seemed to make worse), Dayquil/Nyquil liquid med (helped symptoms some).

## 2019-12-31 NOTE — Assessment & Plan Note (Signed)
This is a pleasant 79 year old male, he is post bone marrow transplant for multiple myeloma, currently in remission. He has had all 3 of his COVID-19 vaccines, over the past week he developed a sore throat, cough, no constitutional symptoms, he had a mild amount of facial pressure that improved and then worsened but overall everything is getting better now. He is speaking full sentences, I think he is just at the tail end of a cold. Adding some Tessalon Perles because his cough is the worst symptom, and if persistent discomfort over another week we will going get some chest imaging and consider antibiotics.

## 2020-01-03 ENCOUNTER — Other Ambulatory Visit: Payer: Self-pay

## 2020-01-03 ENCOUNTER — Inpatient Hospital Stay: Payer: Medicare Other | Attending: Hematology & Oncology | Admitting: Hematology & Oncology

## 2020-01-03 ENCOUNTER — Telehealth: Payer: Self-pay

## 2020-01-03 ENCOUNTER — Inpatient Hospital Stay: Payer: Medicare Other

## 2020-01-03 ENCOUNTER — Encounter: Payer: Self-pay | Admitting: Hematology & Oncology

## 2020-01-03 VITALS — BP 98/52 | HR 56 | Temp 97.7°F | Resp 18 | Wt 196.0 lb

## 2020-01-03 DIAGNOSIS — Z9861 Coronary angioplasty status: Secondary | ICD-10-CM | POA: Diagnosis not present

## 2020-01-03 DIAGNOSIS — I2510 Atherosclerotic heart disease of native coronary artery without angina pectoris: Secondary | ICD-10-CM

## 2020-01-03 DIAGNOSIS — C9000 Multiple myeloma not having achieved remission: Secondary | ICD-10-CM

## 2020-01-03 DIAGNOSIS — C9001 Multiple myeloma in remission: Secondary | ICD-10-CM | POA: Insufficient documentation

## 2020-01-03 LAB — CMP (CANCER CENTER ONLY)
ALT: 35 U/L (ref 0–44)
AST: 24 U/L (ref 15–41)
Albumin: 4 g/dL (ref 3.5–5.0)
Alkaline Phosphatase: 105 U/L (ref 38–126)
Anion gap: 7 (ref 5–15)
BUN: 12 mg/dL (ref 8–23)
CO2: 34 mmol/L — ABNORMAL HIGH (ref 22–32)
Calcium: 9.6 mg/dL (ref 8.9–10.3)
Chloride: 100 mmol/L (ref 98–111)
Creatinine: 1.1 mg/dL (ref 0.61–1.24)
GFR, Estimated: >60 mL/min (ref >60)
Glucose, Bld: 93 mg/dL (ref 70–99)
Potassium: 3.5 mmol/L (ref 3.5–5.1)
Sodium: 141 mmol/L (ref 135–145)
Total Bilirubin: 0.7 mg/dL (ref 0.3–1.2)
Total Protein: 6.9 g/dL (ref 6.5–8.1)

## 2020-01-03 LAB — CBC WITH DIFFERENTIAL (CANCER CENTER ONLY)
Abs Immature Granulocytes: 0.05 10*3/uL (ref 0.00–0.07)
Basophils Absolute: 0.1 10*3/uL (ref 0.0–0.1)
Basophils Relative: 1 %
Eosinophils Absolute: 0.2 10*3/uL (ref 0.0–0.5)
Eosinophils Relative: 2 %
HCT: 41.3 % (ref 39.0–52.0)
Hemoglobin: 13.4 g/dL (ref 13.0–17.0)
Immature Granulocytes: 1 %
Lymphocytes Relative: 27 %
Lymphs Abs: 2 10*3/uL (ref 0.7–4.0)
MCH: 29.6 pg (ref 26.0–34.0)
MCHC: 32.4 g/dL (ref 30.0–36.0)
MCV: 91.2 fL (ref 80.0–100.0)
Monocytes Absolute: 0.8 10*3/uL (ref 0.1–1.0)
Monocytes Relative: 10 %
Neutro Abs: 4.5 10*3/uL (ref 1.7–7.7)
Neutrophils Relative %: 59 %
Platelet Count: 181 10*3/uL (ref 150–400)
RBC: 4.53 MIL/uL (ref 4.22–5.81)
RDW: 14.1 % (ref 11.5–15.5)
WBC Count: 7.5 10*3/uL (ref 4.0–10.5)
nRBC: 0 % (ref 0.0–0.2)

## 2020-01-03 LAB — LACTATE DEHYDROGENASE: LDH: 112 U/L (ref 98–192)

## 2020-01-03 MED ORDER — METHYLPREDNISOLONE 4 MG PO TBPK: ORAL_TABLET | ORAL | 1 refills | Status: DC

## 2020-01-03 MED ORDER — CEFDINIR 300 MG PO CAPS: 600.0000 mg | ORAL_CAPSULE | Freq: Every day | ORAL | 0 refills | Status: DC

## 2020-01-03 NOTE — Progress Notes (Signed)
Hematology and Oncology Follow Up Visit  [name] [name] [id number] [birth year] 79 y.o. 01/03/2020   Principle Diagnosis:  IgG kappa myeloma - Trisomy 11 by FISH Acute thromboembolism of the right lower leg  Past Therapy: S/P ASCT at [facility] on 04/02/2015 Patient s/p cycle 5 of Velcade/Revlimid/Decadron Revlimid 10mg  po q day (21/28) -- d/c on 09/15/2017  Current Therapy:        Zometa 4 mg IV every3 months - next dose due 01/2020 Xarelto 10 mg by mouth daily   Interim History:  Mr. Fenn is here today for follow-up.  Sounds like he may have little bit of a respiratory infection.  He has been coughing up greenish mucus for a couple weeks.  He is had a tough time sleeping.  He is try some Mucinex.  He went to his family doctor.  I think he helped out a little bit.  He is not on any antibiotics.  I think we probably need to get him on some antibiotic for because of the history of myeloma and I am sure that he has a little bit of a weakened immune system.  Thankfully, there was no monoclonal spike in his blood work the last time that we saw him.  His IgG level was 837 mg/dL.  However, there was a slight bump in his Kappa light chain up to 5.6 mg/dL.  He and his wife are not going up to [place] for Thanksgiving.  Apparently, people are coming to their house for Christmas.  He has had no obvious fever.  There is no bleeding.  He has had no diarrhea.  He has had no rashes.  There is been no problems with bleeding.  He is on low-dose maintenance Eliquis.  Currently, I was his performance status is ECOG 1.   Medications:  Allergies as of 01/03/2020      Reactions   Budesonide-formoterol Fumarate Itching, Other (See Comments)   Codeine Itching   Hydrocodone-acetaminophen Itching   Mushroom Extract Complex Nausea And Vomiting, Other (See Comments)   Only shitake mushroom  causes this reaction. Flu-like symptoms from portabello mushrooms      Medication List       Accurate  as of January 03, 2020 11:09 AM. If you have any questions, ask your nurse or doctor.        amLODipine 5 MG tablet Commonly known as: NORVASC TAKE 1 TABLET DAILY   aspirin EC 81 MG tablet Take by mouth.   benzonatate 200 MG capsule Commonly known as: TESSALON Take 1 capsule (200 mg total) by mouth 3 (three) times daily as needed for cough.   carvedilol 6.25 MG tablet Commonly known as: COREG TAKE 1 TABLET TWICE DAILY  WITH MEALS   cetirizine 10 MG tablet Commonly known as: EQ Allergy Relief (Cetirizine) Take 1 tablet (10 mg total) by mouth daily.   esomeprazole 40 MG capsule Commonly known as: NEXIUM TAKE 1 CAPSULE DAILY AT 12 NOON   famciclovir 500 MG tablet Commonly known as: FAMVIR TAKE 1 TABLET DAILY   fluticasone 50 MCG/ACT nasal spray Commonly known as: FLONASE Place 2 sprays into both nostrils daily.   gabapentin 600 MG tablet Commonly known as: NEURONTIN TAKE 1 TABLET EVERY MORNINGAND 2 TABLETS EVERY NIGHT   hydrochlorothiazide 12.5 MG capsule Commonly known as: MICROZIDE TAKE 1 CAPSULE DAILY   isosorbide mononitrate 30 MG 24 hr tablet Commonly known as: IMDUR TAKE 1 TABLET TWICE A DAY   Klor-Con M20 20 MEQ tablet Generic drug: potassium chloride  SA TAKE 1 TABLET TWICE A DAY   levothyroxine 100 MCG tablet Commonly known as: Synthroid Take 1 tablet (100 mcg total) by mouth daily before breakfast.   LORazepam 1 MG tablet Commonly known as: ATIVAN Take 1 tablet (1 mg total) by mouth at bedtime.   lubiprostone 24 MCG capsule Commonly known as: Amitiza 1 capsule 1-2 times daily for opiate-induced constipation   naloxegol oxalate 25 MG Tabs tablet Commonly known as: Movantik Take 1 tablet (25 mg total) by mouth daily.   nitroGLYCERIN 0.4 MG SL tablet Commonly known as: NITROSTAT Place 1 tablet (0.4 mg total) under the tongue every 5 (five) minutes as needed. For chest pain   oxyCODONE-acetaminophen 10-325 MG tablet Commonly known as:  PERCOCET Take 1 tablet by mouth every 8 (eight) hours as needed.   predniSONE 50 MG tablet Commonly known as: DELTASONE One tab PO daily for 5 days.   pyridoxine 100 MG tablet Commonly known as: B-6 Take 100 mg by mouth daily.   RA Vitamin B-12 TR 1000 MCG Tbcr Generic drug: Cyanocobalamin Take 1 tablet by mouth daily.   rivaroxaban 10 MG Tabs tablet Commonly known as: XARELTO Take 1 tablet (10 mg total) by mouth daily with supper.   tamsulosin 0.4 MG Caps capsule Commonly known as: FLOMAX TAKE 1 CAPSULE DAILY AFTER BREAKFAST       Allergies:  Allergies  Allergen Reactions  . Budesonide-Formoterol Fumarate Itching and Other (See Comments)  . Codeine Itching  . Hydrocodone-Acetaminophen Itching  . Mushroom Extract Complex Nausea And Vomiting and Other (See Comments)    Only shitake mushroom  causes this reaction. Flu-like symptoms from portabello mushrooms    Past Medical History, Surgical history, Social history, and Family History were reviewed and updated.  Review of Systems: Review of Systems  HENT: Negative.   Eyes: Negative.   Respiratory: Negative.   Cardiovascular: Positive for chest pain.  Gastrointestinal: Negative.   Genitourinary: Negative.   Musculoskeletal: Positive for back pain.  Skin: Negative.   Neurological: Negative.   Endo/Heme/Allergies: Negative.   Psychiatric/Behavioral: Negative.      Physical Exam:  weight is 196 lb (88.9 kg). His oral temperature is 97.7 F (36.5 C). His blood pressure is 98/52 (abnormal) and his pulse is 56 (abnormal). His respiration is 18 and oxygen saturation is 97%.   Wt Readings from Last 3 Encounters:  01/03/20 196 lb (88.9 kg)  11/27/19 194 lb (88 kg)  10/30/19 197 lb 4 oz (89.5 kg)    Physical Exam Vitals reviewed.  HENT:     Head: Normocephalic and atraumatic.  Eyes:     Pupils: Pupils are equal, round, and reactive to light.  Cardiovascular:     Rate and Rhythm: Normal rate and regular  rhythm.     Heart sounds: Normal heart sounds.  Pulmonary:     Effort: Pulmonary effort is normal.     Breath sounds: Normal breath sounds.  Abdominal:     General: Bowel sounds are normal.     Palpations: Abdomen is soft.  Musculoskeletal:        General: No tenderness or deformity. Normal range of motion.     Cervical back: Normal range of motion.  Lymphadenopathy:     Cervical: No cervical adenopathy.  Skin:    General: Skin is warm and dry.     Findings: No erythema or rash.  Neurological:     Mental Status: He is alert and oriented to person, place, and time.  Psychiatric:  Behavior: Behavior normal.        Thought Content: Thought content normal.        Judgment: Judgment normal.      Lab Results  Component Value Date   WBC 7.5 01/03/2020   HGB 13.4 01/03/2020   HCT 41.3 01/03/2020   MCV 91.2 01/03/2020   PLT 181 01/03/2020   Lab Results  Component Value Date   FERRITIN 199 07/18/2019   IRON 65 07/18/2019   TIBC 316 07/18/2019   UIBC 251 07/18/2019   IRONPCTSAT 21 07/18/2019   Lab Results  Component Value Date   RETICCTPCT 1.7 07/18/2019   RBC 4.53 01/03/2020   Lab Results  Component Value Date   KPAFRELGTCHN 56.2 (H) 11/27/2019   LAMBDASER 7.0 11/27/2019   KAPLAMBRATIO 8.03 (H) 11/27/2019   Lab Results  Component Value Date   IGGSERUM 837 11/27/2019   IGA 56 (L) 11/27/2019   IGMSERUM 42 11/27/2019   Lab Results  Component Value Date   TOTALPROTELP 6.2 11/27/2019   ALBUMINELP 3.6 11/27/2019   A1GS 0.2 11/27/2019   A2GS 0.8 11/27/2019   BETS 0.9 11/27/2019   BETA2SER 0.3 02/11/2015   GAMS 0.8 11/27/2019   MSPIKE Not Observed 11/27/2019   SPEI Comment 10/30/2019     Chemistry      Component Value Date/Time   NA 141 01/03/2020 1022   NA 145 (H) 06/28/2019 1501   NA 140 01/27/2017 1044   NA 141 06/05/2015 1036   K 3.5 01/03/2020 1022   K 3.3 01/27/2017 1044   K 2.9 (LL) 06/05/2015 1036   CL 100 01/03/2020 1022   CL 101  01/27/2017 1044   CO2 34 (H) 01/03/2020 1022   CO2 28 01/27/2017 1044   CO2 27 06/05/2015 1036   BUN 12 01/03/2020 1022   BUN 15 06/28/2019 1501   BUN 14 01/27/2017 1044   BUN 13.1 06/05/2015 1036   CREATININE 1.10 01/03/2020 1022   CREATININE 1.16 10/08/2018 1531   CREATININE 1.0 06/05/2015 1036   GLU 152 03/30/2016 0000      Component Value Date/Time   CALCIUM 9.6 01/03/2020 1022   CALCIUM 8.9 01/27/2017 1044   CALCIUM 9.4 06/05/2015 1036   ALKPHOS 105 01/03/2020 1022   ALKPHOS 126 (H) 01/27/2017 1044   ALKPHOS 94 06/05/2015 1036   AST 24 01/03/2020 1022   AST 22 06/05/2015 1036   ALT 35 01/03/2020 1022   ALT 33 01/27/2017 1044   ALT 18 06/05/2015 1036   BILITOT 0.7 01/03/2020 1022   BILITOT 0.59 06/05/2015 1036       Impression and Plan: Mr. Requena is a very pleasant 79 yo caucasian gentleman with history of IgG kappa myeloma.  He underwent standard induction chemotherapy and then ultimately stem cell transplant at [facility] in February 2017.   I we will have to watch the light chains very closely.  For right now, we will have to make sure he does not develop pneumonia.  I will put him on some Omnicef.  We will have him take 600 mg p.o. daily for 7 days.  I will also put him on a Medrol dose pack.  We will plan to get him back before Christmas.  He will be due for Zometa in December.        [name], MD 11/19/202111:09 AM

## 2020-01-03 NOTE — Telephone Encounter (Signed)
S/w pt per 01/03/20 los and he is aware of his f/u appts.... AOM

## 2020-01-05 LAB — IGG, IGA, IGM
IgA: 66 mg/dL (ref 61–437)
IgG (Immunoglobin G), Serum: 898 mg/dL (ref 603–1613)
IgM (Immunoglobulin M), Srm: 52 mg/dL (ref 15–143)

## 2020-01-06 LAB — PROTEIN ELECTROPHORESIS, SERUM, WITH REFLEX
A/G Ratio: 1.3 (ref 0.7–1.7)
Albumin ELP: 3.4 g/dL (ref 2.9–4.4)
Alpha-1-Globulin: 0.3 g/dL (ref 0.0–0.4)
Alpha-2-Globulin: 0.8 g/dL (ref 0.4–1.0)
Beta Globulin: 0.9 g/dL (ref 0.7–1.3)
Gamma Globulin: 0.7 g/dL (ref 0.4–1.8)
Globulin, Total: 2.7 g/dL (ref 2.2–3.9)
Total Protein ELP: 6.1 g/dL (ref 6.0–8.5)

## 2020-01-06 LAB — KAPPA/LAMBDA LIGHT CHAINS
Kappa free light chain: 84 mg/L — ABNORMAL HIGH (ref 3.3–19.4)
Kappa, lambda light chain ratio: 9.44 — ABNORMAL HIGH (ref 0.26–1.65)
Lambda free light chains: 8.9 mg/L (ref 5.7–26.3)

## 2020-01-13 ENCOUNTER — Encounter: Payer: Self-pay | Admitting: *Deleted

## 2020-01-13 ENCOUNTER — Telehealth: Payer: Self-pay | Admitting: *Deleted

## 2020-01-13 ENCOUNTER — Other Ambulatory Visit: Payer: Self-pay | Admitting: *Deleted

## 2020-01-13 MED ORDER — CEFDINIR 300 MG PO CAPS
600.0000 mg | ORAL_CAPSULE | Freq: Every day | ORAL | 0 refills | Status: DC
Start: 2020-01-13 — End: 2020-01-31

## 2020-01-13 NOTE — Telephone Encounter (Signed)
Patient calling stating that congestion is better but not totally relieved.  Asked for refill on Omnicef that Dr Marin Olp called in for him.  Ok per Dr Marin Olp.  RX sent to patient pharmacy.

## 2020-01-16 ENCOUNTER — Ambulatory Visit: Payer: Medicare Other | Admitting: Sports Medicine

## 2020-01-23 ENCOUNTER — Other Ambulatory Visit: Payer: Self-pay | Admitting: Sports Medicine

## 2020-01-31 ENCOUNTER — Inpatient Hospital Stay: Payer: Medicare Other | Attending: Hematology & Oncology

## 2020-01-31 ENCOUNTER — Inpatient Hospital Stay (HOSPITAL_BASED_OUTPATIENT_CLINIC_OR_DEPARTMENT_OTHER): Payer: Medicare Other | Admitting: Hematology & Oncology

## 2020-01-31 ENCOUNTER — Encounter: Payer: Self-pay | Admitting: Hematology & Oncology

## 2020-01-31 ENCOUNTER — Other Ambulatory Visit: Payer: Self-pay

## 2020-01-31 ENCOUNTER — Inpatient Hospital Stay: Payer: Medicare Other

## 2020-01-31 VITALS — BP 122/57 | HR 62 | Temp 98.5°F | Resp 18 | Wt 195.0 lb

## 2020-01-31 DIAGNOSIS — C9000 Multiple myeloma not having achieved remission: Secondary | ICD-10-CM | POA: Diagnosis not present

## 2020-01-31 DIAGNOSIS — Z9861 Coronary angioplasty status: Secondary | ICD-10-CM | POA: Diagnosis not present

## 2020-01-31 DIAGNOSIS — C9001 Multiple myeloma in remission: Secondary | ICD-10-CM

## 2020-01-31 DIAGNOSIS — Z86718 Personal history of other venous thrombosis and embolism: Secondary | ICD-10-CM | POA: Diagnosis not present

## 2020-01-31 DIAGNOSIS — Z7901 Long term (current) use of anticoagulants: Secondary | ICD-10-CM | POA: Diagnosis not present

## 2020-01-31 DIAGNOSIS — M898X9 Other specified disorders of bone, unspecified site: Secondary | ICD-10-CM

## 2020-01-31 DIAGNOSIS — I2510 Atherosclerotic heart disease of native coronary artery without angina pectoris: Secondary | ICD-10-CM

## 2020-01-31 DIAGNOSIS — M899 Disorder of bone, unspecified: Secondary | ICD-10-CM

## 2020-01-31 LAB — CBC WITH DIFFERENTIAL (CANCER CENTER ONLY)
Abs Immature Granulocytes: 0.02 10*3/uL (ref 0.00–0.07)
Basophils Absolute: 0 10*3/uL (ref 0.0–0.1)
Basophils Relative: 0 %
Eosinophils Absolute: 0.1 10*3/uL (ref 0.0–0.5)
Eosinophils Relative: 2 %
HCT: 39.6 % (ref 39.0–52.0)
Hemoglobin: 12.8 g/dL — ABNORMAL LOW (ref 13.0–17.0)
Immature Granulocytes: 0 %
Lymphocytes Relative: 32 %
Lymphs Abs: 2 10*3/uL (ref 0.7–4.0)
MCH: 29.6 pg (ref 26.0–34.0)
MCHC: 32.3 g/dL (ref 30.0–36.0)
MCV: 91.5 fL (ref 80.0–100.0)
Monocytes Absolute: 0.6 10*3/uL (ref 0.1–1.0)
Monocytes Relative: 9 %
Neutro Abs: 3.5 10*3/uL (ref 1.7–7.7)
Neutrophils Relative %: 57 %
Platelet Count: 178 10*3/uL (ref 150–400)
RBC: 4.33 MIL/uL (ref 4.22–5.81)
RDW: 14.5 % (ref 11.5–15.5)
WBC Count: 6.2 10*3/uL (ref 4.0–10.5)
nRBC: 0 % (ref 0.0–0.2)

## 2020-01-31 LAB — CMP (CANCER CENTER ONLY)
ALT: 21 U/L (ref 0–44)
AST: 19 U/L (ref 15–41)
Albumin: 4 g/dL (ref 3.5–5.0)
Alkaline Phosphatase: 90 U/L (ref 38–126)
Anion gap: 8 (ref 5–15)
BUN: 12 mg/dL (ref 8–23)
CO2: 31 mmol/L (ref 22–32)
Calcium: 9.5 mg/dL (ref 8.9–10.3)
Chloride: 102 mmol/L (ref 98–111)
Creatinine: 1.06 mg/dL (ref 0.61–1.24)
GFR, Estimated: >60 mL/min (ref >60)
Glucose, Bld: 108 mg/dL — ABNORMAL HIGH (ref 70–99)
Potassium: 3.2 mmol/L — ABNORMAL LOW (ref 3.5–5.1)
Sodium: 141 mmol/L (ref 135–145)
Total Bilirubin: 0.6 mg/dL (ref 0.3–1.2)
Total Protein: 6.8 g/dL (ref 6.5–8.1)

## 2020-01-31 LAB — LACTATE DEHYDROGENASE: LDH: 130 U/L (ref 98–192)

## 2020-01-31 MED ORDER — ZOLEDRONIC ACID 4 MG/100ML IV SOLN
4.0000 mg | Freq: Once | INTRAVENOUS | Status: AC
  Administered 2020-01-31: 4 mg via INTRAVENOUS
  Filled 2020-01-31: qty 100

## 2020-01-31 MED ORDER — ZOLEDRONIC ACID 4 MG/5ML IV CONC
4.0000 mg | Freq: Once | INTRAVENOUS | Status: DC
  Filled 2020-01-31: qty 5

## 2020-01-31 NOTE — Progress Notes (Signed)
Hematology and Oncology Follow Up Visit  Alyssa Mancera [id number] 1940/04/24 79 y.o. 01/31/2020   Principle Diagnosis:  IgG kappa myeloma - Trisomy 11 by FISH Acute thromboembolism of the right lower leg  Past Therapy: S/P ASCT at [facility] on 04/02/2015 Patient s/p cycle 5 of Velcade/Revlimid/Decadron Revlimid 10mg  po q day (21/28) -- d/c on 09/15/2017  Current Therapy:        Zometa 4 mg IV every3 months - next dose due 04/2020 Xarelto 10 mg by mouth daily   Interim History:  Mr. Wise is here today for follow-up.  Unfortunately, I think that we are looking at the likelihood of his myeloma try to recur.  Over the past few months, his light chains have been going up slowly but surely.  His Kappa light chain in October it was 5.6 mg/dL.  There is no monoclonal spike in his blood.  His IgG level has been normal at 900 mg/dL.  I think were probably going to have to do a bone marrow test on him.  I just wonder if he has different cytogenetics now.  I also want to do a 24-hour urine on him.  I want to see if there is light chains in his urine now.  He had a PET scan done back in August.  Everything looked fine with no evidence of lytic lesions or activity for myeloma.  He feels okay.  This respiratory issue that he has had seems to be improving.  He has had a good appetite.  He had a very nice Thanksgiving.  There will be a lot of company coming in for Christmas.  He has had no fever.  He has had no nausea or vomiting.  He has had no cough.  His back seems to be doing pretty well right now.  There is no change in bowel or bladder habits.  He has had no rashes.  Overall, I would say his performance status is ECOG 1.    Medications:  Allergies as of 01/31/2020      Reactions   Budesonide-formoterol Fumarate Itching, Other (See Comments)   Codeine Itching   Hydrocodone-acetaminophen Itching   Mushroom Extract Complex Nausea And Vomiting, Other (See Comments)   Only  shitake mushroom  causes this reaction. Flu-like symptoms from portabello mushrooms      Medication List       Accurate as of January 31, 2020  2:28 PM. If you have any questions, ask your nurse or doctor.        STOP taking these medications   cefdinir 300 MG capsule Commonly known as: OMNICEF Stopped by: [name], MD   methylPREDNISolone 4 MG Tbpk tablet Commonly known as: MEDROL DOSEPAK Stopped by: [name], MD   naloxegol oxalate 25 MG Tabs tablet Commonly known as: Movantik Stopped by: [name], MD   predniSONE 50 MG tablet Commonly known as: DELTASONE Stopped by: [name], MD     TAKE these medications   amLODipine 5 MG tablet Commonly known as: NORVASC TAKE 1 TABLET DAILY   aspirin EC 81 MG tablet Take by mouth.   benzonatate 200 MG capsule Commonly known as: TESSALON Take 1 capsule (200 mg total) by mouth 3 (three) times daily as needed for cough.   carvedilol 6.25 MG tablet Commonly known as: COREG TAKE 1 TABLET TWICE DAILY  WITH MEALS   cetirizine 10 MG tablet Commonly known as: EQ Allergy Relief (Cetirizine) Take 1 tablet (10 mg total) by mouth  daily.   esomeprazole 40 MG capsule Commonly known as: NEXIUM TAKE 1 CAPSULE DAILY AT 12 NOON   famciclovir 500 MG tablet Commonly known as: FAMVIR TAKE 1 TABLET DAILY   fluticasone 50 MCG/ACT nasal spray Commonly known as: FLONASE Place 2 sprays into both nostrils daily.   gabapentin 600 MG tablet Commonly known as: NEURONTIN TAKE 1 TABLET EVERY MORNINGAND 2 TABLETS EVERY NIGHT   hydrochlorothiazide 12.5 MG capsule Commonly known as: MICROZIDE TAKE 1 CAPSULE DAILY   isosorbide mononitrate 30 MG 24 hr tablet Commonly known as: IMDUR TAKE 1 TABLET TWICE A DAY   Klor-Con M20 20 MEQ tablet Generic drug: potassium chloride SA TAKE 1 TABLET TWICE A DAY   levothyroxine 100 MCG tablet Commonly known as: Synthroid Take 1 tablet (100 mcg total) by mouth daily before  breakfast.   LORazepam 1 MG tablet Commonly known as: ATIVAN Take 1 tablet (1 mg total) by mouth at bedtime.   lubiprostone 24 MCG capsule Commonly known as: Amitiza 1 capsule 1-2 times daily for opiate-induced constipation   nitroGLYCERIN 0.4 MG SL tablet Commonly known as: NITROSTAT Place 1 tablet (0.4 mg total) under the tongue every 5 (five) minutes as needed. For chest pain   oxyCODONE-acetaminophen 10-325 MG tablet Commonly known as: PERCOCET Take 1 tablet by mouth every 8 (eight) hours as needed.   pyridoxine 100 MG tablet Commonly known as: B-6 Take 100 mg by mouth daily.   RA Vitamin B-12 TR 1000 MCG Tbcr Generic drug: Cyanocobalamin Take 1 tablet by mouth daily.   rivaroxaban 10 MG Tabs tablet Commonly known as: XARELTO Take 1 tablet (10 mg total) by mouth daily with supper.   tamsulosin 0.4 MG Caps capsule Commonly known as: FLOMAX TAKE 1 CAPSULE DAILY AFTER BREAKFAST       Allergies:  Allergies  Allergen Reactions  . Budesonide-Formoterol Fumarate Itching and Other (See Comments)  . Codeine Itching  . Hydrocodone-Acetaminophen Itching  . Mushroom Extract Complex Nausea And Vomiting and Other (See Comments)    Only shitake mushroom  causes this reaction. Flu-like symptoms from portabello mushrooms    Past Medical History, Surgical history, Social history, and Family History were reviewed and updated.  Review of Systems: Review of Systems  HENT: Negative.   Eyes: Negative.   Respiratory: Negative.   Cardiovascular: Positive for chest pain.  Gastrointestinal: Negative.   Genitourinary: Negative.   Musculoskeletal: Positive for back pain.  Skin: Negative.   Neurological: Negative.   Endo/Heme/Allergies: Negative.   Psychiatric/Behavioral: Negative.      Physical Exam:  weight is 195 lb (88.5 kg). His oral temperature is 98.5 F (36.9 C). His blood pressure is 122/57 (abnormal) and his pulse is 62. His respiration is 18 and oxygen saturation  is 93%.   Wt Readings from Last 3 Encounters:  01/31/20 195 lb (88.5 kg)  01/03/20 196 lb (88.9 kg)  11/27/19 194 lb (88 kg)    Physical Exam Vitals reviewed.  HENT:     Head: Normocephalic and atraumatic.  Eyes:     Pupils: Pupils are equal, round, and reactive to light.  Cardiovascular:     Rate and Rhythm: Normal rate and regular rhythm.     Heart sounds: Normal heart sounds.  Pulmonary:     Effort: Pulmonary effort is normal.     Breath sounds: Normal breath sounds.  Abdominal:     General: Bowel sounds are normal.     Palpations: Abdomen is soft.  Musculoskeletal:  General: No tenderness or deformity. Normal range of motion.     Cervical back: Normal range of motion.  Lymphadenopathy:     Cervical: No cervical adenopathy.  Skin:    General: Skin is warm and dry.     Findings: No erythema or rash.  Neurological:     Mental Status: He is alert and oriented to person, place, and time.  Psychiatric:        Behavior: Behavior normal.        Thought Content: Thought content normal.        Judgment: Judgment normal.      Lab Results  Component Value Date   WBC 6.2 01/31/2020   HGB 12.8 (L) 01/31/2020   HCT 39.6 01/31/2020   MCV 91.5 01/31/2020   PLT 178 01/31/2020   Lab Results  Component Value Date   FERRITIN 199 07/18/2019   IRON 65 07/18/2019   TIBC 316 07/18/2019   UIBC 251 07/18/2019   IRONPCTSAT 21 07/18/2019   Lab Results  Component Value Date   RETICCTPCT 1.7 07/18/2019   RBC 4.33 01/31/2020   Lab Results  Component Value Date   KPAFRELGTCHN 84.0 (H) 01/03/2020   LAMBDASER 8.9 01/03/2020   KAPLAMBRATIO 9.44 (H) 01/03/2020   Lab Results  Component Value Date   IGGSERUM 898 01/03/2020   IGA 66 01/03/2020   IGMSERUM 52 01/03/2020   Lab Results  Component Value Date   TOTALPROTELP 6.1 01/03/2020   ALBUMINELP 3.4 01/03/2020   A1GS 0.3 01/03/2020   A2GS 0.8 01/03/2020   BETS 0.9 01/03/2020   BETA2SER 0.3 02/11/2015   GAMS 0.7  01/03/2020   MSPIKE Not Observed 01/03/2020   SPEI Comment 10/30/2019     Chemistry      Component Value Date/Time   NA 141 01/31/2020 1334   NA 145 (H) 06/28/2019 1501   NA 140 01/27/2017 1044   NA 141 06/05/2015 1036   K 3.2 (L) 01/31/2020 1334   K 3.3 01/27/2017 1044   K 2.9 (LL) 06/05/2015 1036   CL 102 01/31/2020 1334   CL 101 01/27/2017 1044   CO2 31 01/31/2020 1334   CO2 28 01/27/2017 1044   CO2 27 06/05/2015 1036   BUN 12 01/31/2020 1334   BUN 15 06/28/2019 1501   BUN 14 01/27/2017 1044   BUN 13.1 06/05/2015 1036   CREATININE 1.06 01/31/2020 1334   CREATININE 1.16 10/08/2018 1531   CREATININE 1.0 06/05/2015 1036   GLU 152 03/30/2016 0000      Component Value Date/Time   CALCIUM 9.5 01/31/2020 1334   CALCIUM 8.9 01/27/2017 1044   CALCIUM 9.4 06/05/2015 1036   ALKPHOS 90 01/31/2020 1334   ALKPHOS 126 (H) 01/27/2017 1044   ALKPHOS 94 06/05/2015 1036   AST 19 01/31/2020 1334   AST 22 06/05/2015 1036   ALT 21 01/31/2020 1334   ALT 33 01/27/2017 1044   ALT 18 06/05/2015 1036   BILITOT 0.6 01/31/2020 1334   BILITOT 0.59 06/05/2015 1036       Impression and Plan: Mr. Capell is a very pleasant 79 yo caucasian gentleman with history of IgG kappa myeloma.  He underwent standard induction chemotherapy and then ultimately stem cell transplant at [facility] in February 2017.   Again, I think we are in a situation where we might be looking at myeloma recurrence.  I will have to do a bone marrow test on him.  We will try to get this set up the last week and  December.  The cytogenetics and FISH panel will be important.  If we do find that there is recurrent disease, then we will see about initiating therapy on him again.  I think we should be able to get a good response.  Again, we have to see what the chromosome studies are.  I will plan to get him back to see [place] after we get the results back from his bone marrow and cytogenetics.    He will get his Zometa  today.   [name], MD 12/17/20212:28 PM

## 2020-01-31 NOTE — Patient Instructions (Signed)
Zoledronic Acid injection (Hypercalcemia, Oncology) What is this medicine? ZOLEDRONIC ACID (ZOE le dron ik AS id) lowers the amount of calcium loss from bone. It is used to treat too much calcium in your blood from cancer. It is also used to prevent complications of cancer that has spread to the bone. This medicine may be used for other purposes; ask your health care provider or pharmacist if you have questions. COMMON BRAND NAME(S): Zometa What should I tell my health care provider before I take this medicine? They need to know if you have any of these conditions:  aspirin-sensitive asthma  cancer, especially if you are receiving medicines used to treat cancer  dental disease or wear dentures  infection  kidney disease  receiving corticosteroids like dexamethasone or prednisone  an unusual or allergic reaction to zoledronic acid, other medicines, foods, dyes, or preservatives  pregnant or trying to get pregnant  breast-feeding How should I use this medicine? This medicine is for infusion into a vein. It is given by a health care professional in a hospital or clinic setting. Talk to your pediatrician regarding the use of this medicine in children. Special care may be needed. Overdosage: If you think you have taken too much of this medicine contact a poison control center or emergency room at once. NOTE: This medicine is only for you. Do not share this medicine with others. What if I miss a dose? It is important not to miss your dose. Call your doctor or health care professional if you are unable to keep an appointment. What may interact with this medicine?  certain antibiotics given by injection  NSAIDs, medicines for pain and inflammation, like ibuprofen or naproxen  some diuretics like bumetanide, furosemide  teriparatide  thalidomide This list may not describe all possible interactions. Give your health care provider a list of all the medicines, herbs, non-prescription  drugs, or dietary supplements you use. Also tell them if you smoke, drink alcohol, or use illegal drugs. Some items may interact with your medicine. What should I watch for while using this medicine? Visit your doctor or health care professional for regular checkups. It may be some time before you see the benefit from this medicine. Do not stop taking your medicine unless your doctor tells you to. Your doctor may order blood tests or other tests to see how you are doing. Women should inform their doctor if they wish to become pregnant or think they might be pregnant. There is a potential for serious side effects to an unborn child. Talk to your health care professional or pharmacist for more information. You should make sure that you get enough calcium and vitamin D while you are taking this medicine. Discuss the foods you eat and the vitamins you take with your health care professional. Some people who take this medicine have severe bone, joint, and/or muscle pain. This medicine may also increase your risk for jaw problems or a broken thigh bone. Tell your doctor right away if you have severe pain in your jaw, bones, joints, or muscles. Tell your doctor if you have any pain that does not go away or that gets worse. Tell your dentist and dental surgeon that you are taking this medicine. You should not have major dental surgery while on this medicine. See your dentist to have a dental exam and fix any dental problems before starting this medicine. Take good care of your teeth while on this medicine. Make sure you see your dentist for regular follow-up   appointments. What side effects may I notice from receiving this medicine? Side effects that you should report to your doctor or health care professional as soon as possible:  allergic reactions like skin rash, itching or hives, swelling of the face, lips, or tongue  anxiety, confusion, or depression  breathing problems  changes in vision  eye  pain  feeling faint or lightheaded, falls  jaw pain, especially after dental work  mouth sores  muscle cramps, stiffness, or weakness  redness, blistering, peeling or loosening of the skin, including inside the mouth  trouble passing urine or change in the amount of urine Side effects that usually do not require medical attention (report to your doctor or health care professional if they continue or are bothersome):  bone, joint, or muscle pain  constipation  diarrhea  fever  hair loss  irritation at site where injected  loss of appetite  nausea, vomiting  stomach upset  trouble sleeping  trouble swallowing  weak or tired This list may not describe all possible side effects. Call your doctor for medical advice about side effects. You may report side effects to FDA at 1-800-FDA-1088. Where should I keep my medicine? This drug is given in a hospital or clinic and will not be stored at home. NOTE: This sheet is a summary. It may not cover all possible information. If you have questions about this medicine, talk to your doctor, pharmacist, or health care provider.  2020 Elsevier/Gold Standard (2013-06-29 14:19:39)  

## 2020-02-01 DIAGNOSIS — Z20822 Contact with and (suspected) exposure to covid-19: Secondary | ICD-10-CM | POA: Diagnosis not present

## 2020-02-01 LAB — IGG, IGA, IGM
IgA: 64 mg/dL (ref 61–437)
IgG (Immunoglobin G), Serum: 866 mg/dL (ref 603–1613)
IgM (Immunoglobulin M), Srm: 49 mg/dL (ref 15–143)

## 2020-02-03 ENCOUNTER — Inpatient Hospital Stay: Payer: Medicare Other

## 2020-02-03 ENCOUNTER — Telehealth: Payer: Self-pay | Admitting: Hematology & Oncology

## 2020-02-03 ENCOUNTER — Other Ambulatory Visit: Payer: Self-pay | Admitting: *Deleted

## 2020-02-03 DIAGNOSIS — M899 Disorder of bone, unspecified: Secondary | ICD-10-CM

## 2020-02-03 DIAGNOSIS — Z86718 Personal history of other venous thrombosis and embolism: Secondary | ICD-10-CM | POA: Diagnosis not present

## 2020-02-03 DIAGNOSIS — Z7901 Long term (current) use of anticoagulants: Secondary | ICD-10-CM | POA: Diagnosis not present

## 2020-02-03 DIAGNOSIS — C9000 Multiple myeloma not having achieved remission: Secondary | ICD-10-CM

## 2020-02-03 DIAGNOSIS — C9001 Multiple myeloma in remission: Secondary | ICD-10-CM

## 2020-02-03 LAB — KAPPA/LAMBDA LIGHT CHAINS
Kappa free light chain: 113.1 mg/L — ABNORMAL HIGH (ref 3.3–19.4)
Kappa, lambda light chain ratio: 11.42 — ABNORMAL HIGH (ref 0.26–1.65)
Lambda free light chains: 9.9 mg/L (ref 5.7–26.3)

## 2020-02-03 LAB — PROTEIN ELECTROPHORESIS, SERUM, WITH REFLEX
A/G Ratio: 1.1 (ref 0.7–1.7)
Albumin ELP: 3.4 g/dL (ref 2.9–4.4)
Alpha-1-Globulin: 0.2 g/dL (ref 0.0–0.4)
Alpha-2-Globulin: 0.9 g/dL (ref 0.4–1.0)
Beta Globulin: 1 g/dL (ref 0.7–1.3)
Gamma Globulin: 0.8 g/dL (ref 0.4–1.8)
Globulin, Total: 3 g/dL (ref 2.2–3.9)
Total Protein ELP: 6.4 g/dL (ref 6.0–8.5)

## 2020-02-03 NOTE — Telephone Encounter (Signed)
NO los 12/17

## 2020-02-04 DIAGNOSIS — J069 Acute upper respiratory infection, unspecified: Secondary | ICD-10-CM

## 2020-02-05 LAB — UPEP/UIFE/LIGHT CHAINS/TP, 24-HR UR
% BETA, Urine: 37.7 %
ALPHA 1 URINE: 4.7 %
Albumin, U: 42.2 %
Alpha 2, Urine: 8.4 %
Free Kappa Lt Chains,Ur: 176.34 mg/L — ABNORMAL HIGH (ref 0.63–113.79)
Free Kappa/Lambda Ratio: 38.25 — ABNORMAL HIGH (ref 1.03–31.76)
Free Lambda Lt Chains,Ur: 4.61 mg/L (ref 0.47–11.77)
GAMMA GLOBULIN URINE: 7.1 %
M-SPIKE %, Urine: 19.3 % — ABNORMAL HIGH
M-Spike, Mg/24 Hr: 31 mg/24 hr — ABNORMAL HIGH
Total Protein, Urine-Ur/day: 162 mg/24 hr — ABNORMAL HIGH (ref 30–150)
Total Protein, Urine: 5.5 mg/dL
Total Volume: 2950

## 2020-02-05 MED ORDER — BENZONATATE 200 MG PO CAPS
200.0000 mg | ORAL_CAPSULE | Freq: Three times a day (TID) | ORAL | 0 refills | Status: DC | PRN
Start: 2020-02-05 — End: 2020-11-30

## 2020-02-19 ENCOUNTER — Encounter: Payer: Self-pay | Admitting: Hematology & Oncology

## 2020-02-19 DIAGNOSIS — Z20822 Contact with and (suspected) exposure to covid-19: Secondary | ICD-10-CM | POA: Diagnosis not present

## 2020-02-19 DIAGNOSIS — U071 COVID-19: Secondary | ICD-10-CM | POA: Diagnosis not present

## 2020-02-20 ENCOUNTER — Ambulatory Visit (HOSPITAL_COMMUNITY): Payer: Medicare Other

## 2020-02-20 ENCOUNTER — Telehealth: Payer: Self-pay | Admitting: Hematology & Oncology

## 2020-02-20 NOTE — Telephone Encounter (Signed)
Called and spoke with patient regarding appointment added for 1/27 per 1/6 sch msg

## 2020-02-20 NOTE — Telephone Encounter (Signed)
Message to scheduling for MD f/u

## 2020-02-21 ENCOUNTER — Telehealth (INDEPENDENT_AMBULATORY_CARE_PROVIDER_SITE_OTHER): Payer: Medicare Other | Admitting: Sports Medicine

## 2020-02-21 DIAGNOSIS — U071 COVID-19: Secondary | ICD-10-CM

## 2020-02-21 NOTE — Progress Notes (Signed)
COVID positive Jan 5th. Current Symptoms: cough, body ache, fatigue, headache, runny nose, fever, head congestion, diarrhea. Taking chloresidin which helped with symptoms yesterday. Feeling a little better today. O2 on watch staying from 91-98%.   No longer in remission from multiple myeloma. Patient had to reschedule additional testing until January 20th.

## 2020-02-21 NOTE — Assessment & Plan Note (Signed)
This is a pleasant 80 year old male, he does have a history of multiple myeloma, and other comorbidities. He is vaccinated and boosted, he and his wife both contracted COVID-19 at a Christmas party, he has had symptoms for about 6 days and was diagnosed 2 days ago. He is overall doing well, he feels tired, has a mild cough, and burning in his nose. Otherwise feels okay, on camera he is speaking full sentences, no accessory muscle use, considering his very mild symptoms I think he will probably not need a monoclonal antibody infusion, and we can simply watch this for now, he has oxycodone for muscle aches and this will also help his cough and I am okay with him using over-the-counter cold and flu medications, stay hydrated, warning symptoms advised.

## 2020-02-21 NOTE — Progress Notes (Signed)
   Virtual Visit via WebEx/MyChart   I connected with  [name] [name]  on 02/21/20 via WebEx/MyChart/Doximity Video and verified that I am speaking with the correct person using two identifiers.   I discussed the limitations, risks, security and privacy concerns of performing an evaluation and management service by WebEx/MyChart/Doximity Video, including the higher likelihood of inaccurate diagnosis and treatment, and the availability of in person appointments.  We also discussed the likely need of an additional face to face encounter for complete and high quality delivery of care.  I also discussed with the patient that there may be a patient responsible charge related to this service. The patient expressed understanding and wishes to proceed.  Provider location is in medical facility. Patient location is at their home, different from provider location. People involved in care of the patient during this telehealth encounter were myself, my nurse/medical assistant, and my front office/scheduling team member.  Review of Systems: No fevers, chills, night sweats, weight loss, chest pain, or shortness of breath.   Objective Findings:    General: Speaking full sentences, no audible heavy breathing.  Sounds alert and appropriately interactive.  Appears well.  Face symmetric.  Extraocular movements intact.  Pupils equal and round.  No nasal flaring or accessory muscle use visualized.  Independent interpretation of tests performed by another provider:   None.  Brief History, Exam, Impression, and Recommendations:    COVID-19 This is a pleasant 80 year old male, he does have a history of multiple myeloma, and other comorbidities. He is vaccinated and boosted, he and his wife both contracted COVID-19 at a Christmas party, he has had symptoms for about 6 days and was diagnosed 2 days ago. He is overall doing well, he feels tired, has a mild cough, and burning in his nose. Otherwise feels okay, on  camera he is speaking full sentences, no accessory muscle use, considering his very mild symptoms I think he will probably not need a monoclonal antibody infusion, and we can simply watch this for now, he has oxycodone for muscle aches and this will also help his cough and I am okay with him using over-the-counter cold and flu medications, stay hydrated, warning symptoms advised.   I discussed the above assessment and treatment plan with the patient. The patient was provided an opportunity to ask questions and all were answered. The patient agreed with the plan and demonstrated an understanding of the instructions.   The patient was advised to call back or seek an in-person evaluation if the symptoms worsen or if the condition fails to improve as anticipated.   I provided 30 minutes of face to face and non-face-to-face time during this encounter date, time was needed to gather information, review chart, records, communicate/coordinate with staff remotely, as well as complete documentation.   ___________________________________________ Gwen Her. Dianah Field, M.D., ABFM., CAQSM. Primary Care and [place] In[employer] of [place] [facility]

## 2020-02-24 ENCOUNTER — Encounter: Payer: Self-pay | Admitting: Hematology & Oncology

## 2020-02-25 ENCOUNTER — Other Ambulatory Visit: Payer: Self-pay | Admitting: Sports Medicine

## 2020-02-25 DIAGNOSIS — G4733 Obstructive sleep apnea (adult) (pediatric): Secondary | ICD-10-CM

## 2020-02-25 MED ORDER — ISOSORBIDE MONONITRATE ER 30 MG PO TB24: 30.0000 mg | ORAL_TABLET | Freq: Two times a day (BID) | ORAL | 3 refills | Status: DC

## 2020-02-26 MED ORDER — POTASSIUM CHLORIDE CRYS ER 20 MEQ PO TBCR: 20.0000 meq | EXTENDED_RELEASE_TABLET | Freq: Two times a day (BID) | ORAL | 0 refills | Status: DC

## 2020-02-26 MED ORDER — ISOSORBIDE MONONITRATE ER 30 MG PO TB24: 30.0000 mg | ORAL_TABLET | Freq: Two times a day (BID) | ORAL | 3 refills | Status: DC

## 2020-02-26 MED ORDER — POTASSIUM CHLORIDE CRYS ER 20 MEQ PO TBCR: 20.0000 meq | EXTENDED_RELEASE_TABLET | Freq: Two times a day (BID) | ORAL | 1 refills | Status: DC

## 2020-02-26 MED ORDER — ISOSORBIDE MONONITRATE ER 30 MG PO TB24: 30.0000 mg | ORAL_TABLET | Freq: Two times a day (BID) | ORAL | 0 refills | Status: DC

## 2020-02-26 NOTE — Telephone Encounter (Signed)
Order was not place.  Order placed today. Phone call made to [facility] left message to call back

## 2020-02-28 ENCOUNTER — Telehealth: Payer: Self-pay | Admitting: Emergency Medicine

## 2020-02-28 DIAGNOSIS — U071 COVID-19: Secondary | ICD-10-CM | POA: Diagnosis not present

## 2020-02-28 NOTE — Telephone Encounter (Signed)
Spoke with Lenna Sciara, states she is faxing over a 25 day download from our office and a copy of his original sleep study.  I checked RB's box but I did not yet see this.   Lenna Sciara is needing last office visit from 05/2019 addended to say that patient is benefiting from cpap use.  RB please advise if this note can be addended, or if we need to schedule a new OV to discuss this.  Thanks!

## 2020-03-03 ENCOUNTER — Other Ambulatory Visit: Payer: Self-pay | Admitting: Radiology

## 2020-03-03 NOTE — Telephone Encounter (Signed)
Spoke with Melissa, aware of OV note addendum.  Nothing further needed at this time- will close encounter.

## 2020-03-03 NOTE — Telephone Encounter (Signed)
I adended the note from 05/23/19 to confirm that he is clinically benefiting.

## 2020-03-05 ENCOUNTER — Other Ambulatory Visit: Payer: Self-pay

## 2020-03-05 ENCOUNTER — Encounter (HOSPITAL_COMMUNITY): Payer: Self-pay

## 2020-03-05 ENCOUNTER — Ambulatory Visit (HOSPITAL_COMMUNITY)
Admission: RE | Admit: 2020-03-05 | Discharge: 2020-03-05 | Disposition: A | Discharge status: Home / Self Care | Payer: Medicare Other | Source: Ambulatory Visit | Attending: Hematology & Oncology | Admitting: Hematology & Oncology

## 2020-03-05 ENCOUNTER — Other Ambulatory Visit: Payer: Self-pay | Admitting: Sports Medicine

## 2020-03-05 DIAGNOSIS — C9002 Multiple myeloma in relapse: Secondary | ICD-10-CM | POA: Diagnosis not present

## 2020-03-05 DIAGNOSIS — C9000 Multiple myeloma not having achieved remission: Secondary | ICD-10-CM

## 2020-03-05 DIAGNOSIS — Z87891 Personal history of nicotine dependence: Secondary | ICD-10-CM | POA: Diagnosis not present

## 2020-03-05 LAB — CBC WITH DIFFERENTIAL/PLATELET
Abs Immature Granulocytes: 0.03 10*3/uL (ref 0.00–0.07)
Basophils Absolute: 0 10*3/uL (ref 0.0–0.1)
Basophils Relative: 1 %
Eosinophils Absolute: 0.2 10*3/uL (ref 0.0–0.5)
Eosinophils Relative: 3 %
HCT: 40.6 % (ref 39.0–52.0)
Hemoglobin: 13.4 g/dL (ref 13.0–17.0)
Immature Granulocytes: 0 %
Lymphocytes Relative: 28 %
Lymphs Abs: 2 10*3/uL (ref 0.7–4.0)
MCH: 30.1 pg (ref 26.0–34.0)
MCHC: 33 g/dL (ref 30.0–36.0)
MCV: 91.2 fL (ref 80.0–100.0)
Monocytes Absolute: 0.8 10*3/uL (ref 0.1–1.0)
Monocytes Relative: 11 %
Neutro Abs: 4.1 10*3/uL (ref 1.7–7.7)
Neutrophils Relative %: 57 %
Platelets: 184 10*3/uL (ref 150–400)
RBC: 4.45 MIL/uL (ref 4.22–5.81)
RDW: 14.5 % (ref 11.5–15.5)
WBC: 7.1 10*3/uL (ref 4.0–10.5)
nRBC: 0 % (ref 0.0–0.2)

## 2020-03-05 LAB — PROTIME-INR
INR: 1.1 (ref 0.8–1.2)
Prothrombin Time: 13.3 seconds (ref 11.4–15.2)

## 2020-03-05 LAB — BASIC METABOLIC PANEL
Anion gap: 13 (ref 5–15)
BUN: 17 mg/dL (ref 8–23)
CO2: 23 mmol/L (ref 22–32)
Calcium: 9.3 mg/dL (ref 8.9–10.3)
Chloride: 104 mmol/L (ref 98–111)
Creatinine, Ser: 1.19 mg/dL (ref 0.61–1.24)
GFR, Estimated: >60 mL/min (ref >60)
Glucose, Bld: 110 mg/dL — ABNORMAL HIGH (ref 70–99)
Potassium: 3.5 mmol/L (ref 3.5–5.1)
Sodium: 140 mmol/L (ref 135–145)

## 2020-03-05 MED ORDER — ISOSORBIDE MONONITRATE ER 30 MG PO TB24: 30.0000 mg | ORAL_TABLET | Freq: Two times a day (BID) | ORAL | 3 refills | Status: DC

## 2020-03-05 MED ORDER — FENTANYL CITRATE (PF) 100 MCG/2ML IJ SOLN
INTRAMUSCULAR | Status: AC
  Filled 2020-03-05: qty 2

## 2020-03-05 MED ORDER — MIDAZOLAM HCL 2 MG/2ML IJ SOLN
INTRAMUSCULAR | Status: AC
  Filled 2020-03-05: qty 4

## 2020-03-05 MED ORDER — LIDOCAINE HCL (PF) 1 % IJ SOLN
INTRAMUSCULAR | Status: AC | PRN
  Administered 2020-03-05: 10 mL

## 2020-03-05 MED ORDER — MIDAZOLAM HCL 2 MG/2ML IJ SOLN
INTRAMUSCULAR | Status: AC | PRN
  Administered 2020-03-05 (×2): 1 mg via INTRAVENOUS

## 2020-03-05 MED ORDER — SODIUM CHLORIDE 0.9 % IV SOLN: INTRAVENOUS | Status: DC

## 2020-03-05 MED ORDER — FENTANYL CITRATE (PF) 100 MCG/2ML IJ SOLN
INTRAMUSCULAR | Status: AC | PRN
  Administered 2020-03-05 (×2): 50 ug via INTRAVENOUS

## 2020-03-05 NOTE — H&P (Signed)
Chief Complaint: Patient was seen in consultation today for multiple myeloma  Referring Physician(s): [name],[name] R  Supervising Physician: [name] [name]  Patient Status: [place]  History of Present Illness: [name] [name] is a 80 y.o. male with past medical history of acute DVT, asthma, chronic back pain, GERD, HTN, and multiple myeloma diagnosed in 2016.  He underwent stem cell transplant at [facility] in February 2017 and is currently followed by [name] [name].  His myeloma-related labs have increased persistently over the past 6 months.  Bone marrow biopsy requested for monitoring of treatment response.   [name] [name] presents today in his usual state of health.  He has been NPO this AM.  He does take Xarelto at home and held yesterdays dose.  He denies fever, chills, fatigue, shortness of breath, cough, dysuria.  He is agreeable to bone marrow biopsy today.   Past Medical History:  Diagnosis Date  . Acute deep vein thrombosis (DVT) of tibial vein of right lower extremity ([place]) 09/29/2014  . Arthritis    back  . Asthma    uses Advair bid and asmanex prn  . Cancer ([place])    multiple myeloma  . Chronic back pain    buldging disc and scoliosis  . Chronic bronchitis ([place])    couple of yrs ago  . Complication of anesthesia    severe case of hiccups after hernia surgery;required medication  . Diverticulosis   . Dizziness    related to neck issues  . Emphysema   . Enlarged prostate    takes Flomax daily  . GERD (gastroesophageal reflux disease)    takes PRotonix daily  . History of colon polyps   . Hyperlipidemia    takes Zocor daily  . Hypertension    takes HCTZ,Diovan daily and Isosorbide daily  . Itching    down left arm and leg  . Joint pain   . Macular degeneration    mild,beginning  . Multiple myeloma ([place]) 08/13/2014  . Myocardial infarction ([place]) 2010  . Neck pain   . Pain in limb 06/20/2012  . Pneumonia    hx of---12+yrs ago    Past Surgical  History:  Procedure Laterality Date  . ANTERIOR CERVICAL DECOMP/DISCECTOMY FUSION  01/31/2012   Procedure: ANTERIOR CERVICAL DECOMPRESSION/DISCECTOMY FUSION 1 LEVEL;  Surgeon: [name] Stakes, MD;  Location: [facility];  Service: Neurosurgery;  Laterality: N/A;  Cervical Five-Six  Anterior Cervical Decompression /Diskectomy /Fusion/Plate  . BONE MARROW TRANSPLANT    . CARDIAC CATHETERIZATION  2010/2013   06/07/11 [facility]) LAD 25%, D2 75% (small), pLCx 100%, RCA 25%--medical managment   . COLONOSCOPY    . HERNIA REPAIR  2006   x 3   . salivary gland removed     left   . skin cancer removed from left side of face     basal cell carcinoma  . vein removed from left leg      Allergies: Budesonide-formoterol fumarate, Codeine, Hydrocodone-acetaminophen, and Mushroom extract complex  Medications: Prior to Admission medications   Medication Sig Start Date End Date Taking? Authorizing Provider  amLODipine (NORVASC) 5 MG tablet TAKE 1 TABLET DAILY 02/09/19  Yes Silverio Decamp, MD  aspirin EC 81 MG tablet Take by mouth.   Yes [provider]  benzonatate (TESSALON) 200 MG capsule Take 1 capsule (200 mg total) by mouth 3 (three) times daily as needed for cough. 02/05/20  Yes Silverio Decamp, MD  carvedilol (COREG) 6.25 MG tablet TAKE 1 TABLET  TWICE DAILY  WITH MEALS 01/23/20  Yes Silverio Decamp, MD  cetirizine (EQ ALLERGY RELIEF, CETIRIZINE,) 10 MG tablet Take 1 tablet (10 mg total) by mouth daily. 05/23/19  Yes Collene Gobble, MD  Cyanocobalamin 1000 MCG TBCR Take 1 tablet by mouth daily.    Yes [provider]  esomeprazole (NEXIUM) 40 MG capsule TAKE 1 CAPSULE DAILY AT 12 NOON 01/23/20  Yes Silverio Decamp, MD  famciclovir [facility]) 500 MG tablet TAKE 1 TABLET DAILY 06/03/19  Yes [place], Holli Humbles, NP  fluticasone (FLONASE) 50 MCG/ACT nasal spray Place 2 sprays into both nostrils daily. 09/05/18  Yes Collene Gobble, MD  gabapentin (NEURONTIN) 600  MG tablet TAKE 1 TABLET EVERY MORNINGAND 2 TABLETS EVERY NIGHT 01/23/20  Yes Silverio Decamp, MD  hydrochlorothiazide (MICROZIDE) 12.5 MG capsule TAKE 1 CAPSULE DAILY 02/09/19  Yes Silverio Decamp, MD  isosorbide mononitrate (IMDUR) 30 MG 24 hr tablet Take 1 tablet (30 mg total) by mouth 2 (two) times daily. 02/26/20  Yes Silverio Decamp, MD  levothyroxine (SYNTHROID) 100 MCG tablet Take 1 tablet (100 mcg total) by mouth daily before breakfast. 04/26/19  Yes [name], Rudell Cobb, MD  LORazepam (ATIVAN) 1 MG tablet Take 1 tablet (1 mg total) by mouth at bedtime. 08/27/19  Yes Silverio Decamp, MD  oxyCODONE-acetaminophen (PERCOCET) 10-325 MG tablet Take 1 tablet by mouth every 8 (eight) hours as needed. 12/19/19  Yes Silverio Decamp, MD  potassium chloride SA (KLOR-CON M20) 20 MEQ tablet Take 1 tablet (20 mEq total) by mouth 2 (two) times daily. 02/26/20  Yes Silverio Decamp, MD  pyridoxine (B-6) 100 MG tablet Take 100 mg by mouth daily.    Yes [provider]  tamsulosin (FLOMAX) 0.4 MG CAPS capsule TAKE 1 CAPSULE DAILY AFTER BREAKFAST 07/18/19  Yes Silverio Decamp, MD  lubiprostone (AMITIZA) 24 MCG capsule 1 capsule 1-2 times daily for opiate-induced constipation 12/19/19   Silverio Decamp, MD  nitroGLYCERIN (NITROSTAT) 0.4 MG SL tablet Place 1 tablet (0.4 mg total) under the tongue every 5 (five) minutes as needed. For chest pain 07/24/19   Silverio Decamp, MD  rivaroxaban (XARELTO) 10 MG TABS tablet Take 1 tablet (10 mg total) by mouth daily with supper. 02/11/19   [place], Holli Humbles, NP     Family History  Problem Relation Age of Onset  . Hypertension Mother   . Heart attack Father   . Cancer Sister   . Cancer Sister     Social History   Socioeconomic History  . Marital status: Married    Spouse name: Not on file  . Number of children: 3  . Years of education: Not on file  . Highest education level: Not on file  Occupational  History  . Not on file  Tobacco Use  . Smoking status: Former Smoker    Years: 30.00    Types: Pipe    Quit date: 11/24/2011    Years since quitting: 8.2  . Smokeless tobacco: Former Systems developer    Quit date: 02/15/2011  . Tobacco comment: quit tobacco 3 years ago  Vaping Use  . Vaping Use: Never used  Substance and Sexual Activity  . Alcohol use: Yes    Alcohol/week: 0.0 standard drinks    Comment: 1-2 glasses of wine daily  . Drug use: No  . Sexual activity: Yes  Other Topics Concern  . Not on file  Social History Narrative  . Not on file  Social Determinants of Health   Financial Resource Strain: Not on file  Food Insecurity: Not on file  Transportation Needs: Not on file  Physical Activity: Not on file  Stress: Not on file  Social Connections: Not on file     Review of Systems: A 12 point ROS discussed and pertinent positives are indicated in the HPI above.  All other systems are negative.  Review of Systems  Constitutional: Negative for fatigue and fever.  Respiratory: Negative for cough and shortness of breath.   Cardiovascular: Negative for chest pain.  Gastrointestinal: Negative for abdominal pain, nausea and vomiting.  Musculoskeletal: Negative for back pain.  Psychiatric/Behavioral: Negative for behavioral problems and confusion.    Vital Signs: BP 140/71 (BP Location: Right Arm)   Pulse 71   Temp 97.7 F (36.5 C) (Oral)   Resp 20   Ht 6' (1.829 m)   Wt 195 lb (88.5 kg)   SpO2 97%   BMI 26.45 kg/m   Physical Exam Vitals and nursing note reviewed.  Constitutional:      General: He is not in acute distress.    Appearance: Normal appearance. He is not ill-appearing.  HENT:     Mouth/Throat:     Mouth: Mucous membranes are moist.     Pharynx: Oropharynx is clear.  Cardiovascular:     Rate and Rhythm: Normal rate and regular rhythm.  Pulmonary:     Effort: Pulmonary effort is normal. No respiratory distress.     Breath sounds: Normal breath sounds.   Abdominal:     General: Abdomen is flat.     Palpations: Abdomen is soft.  Musculoskeletal:        General: Normal range of motion.  Skin:    General: Skin is warm and dry.  Neurological:     General: No focal deficit present.     Mental Status: He is alert and oriented to person, place, and time. Mental status is at baseline.  Psychiatric:        Mood and Affect: Mood normal.        Behavior: Behavior normal.        Thought Content: Thought content normal.        Judgment: Judgment normal.      MD Evaluation Airway: WNL Heart: WNL Abdomen: WNL Chest/ Lungs: WNL ASA  Classification: 3 Mallampati/Airway Score: One   Imaging: No results found.  Labs:  CBC: Recent Labs    11/27/19 1302 01/03/20 1022 01/31/20 1334 03/05/20 0940  WBC 5.8 7.5 6.2 7.1  HGB 13.8 13.4 12.8* 13.4  HCT 42.2 41.3 39.6 40.6  PLT 173 181 178 184    COAGS: Recent Labs    03/05/20 0940  INR 1.1    BMP: Recent Labs    07/18/19 0934 08/21/19 1403 09/25/19 1034 10/30/19 1152 11/27/19 1302 01/03/20 1022 01/31/20 1334 03/05/20 0940  NA 140 141 141 141 142 141 141 140  K 3.1* 3.4* 3.7 3.2* 3.8 3.5 3.2* 3.5  CL 104 101 101 101 103 100 102 104  CO2 27 31 34* 31 31 34* 31 23  GLUCOSE 165* 104* 97 101* 123* 93 108* 110*  BUN _0 CALCIUM 9.3 9.7 10.2 9.7 9.8 9.6 9.5 9.3  CREATININE 1.15 1.11 1.15 1.11 1.22 1.10 1.06 1.19  GFRNONAA >60 >60 >60 >60 56* >60 >60 >60  GFRAA >60 >60 >60 >60  --   --   --   --  LIVER FUNCTION TESTS: Recent Labs    10/30/19 1152 11/27/19 1302 01/03/20 1022 01/31/20 1334  BILITOT 0.7 0.7 0.7 0.6  AST 73* _0 ALT 139* 22 35 21  ALKPHOS 93 85 105 90  PROT 6.5 6.5 6.9 6.8  ALBUMIN 4.2 4.2 4.0 4.0    TUMOR MARKERS: No results for input(s): AFPTM, CEA, CA199, CHROMGRNA in the last 8760 hours.  Assessment and Plan: Patient with past medical history of multiple myeloma presents with complaint of increasing kappa free  light chain concerning for disease recurrence.  IR consulted for bone marrow biopsy at the request of Dr. Marin Olp. Case reviewed by Dr. Vernard Gambles who approves patient for procedure.  Patient presents today in their usual state of health.  He has been NPO and is not currently on blood thinners.    Risks and benefits was discussed with the patient and/or patient's family including, but not limited to bleeding, infection, damage to adjacent structures or low yield requiring additional tests.  All of the questions were answered and there is agreement to proceed.  Consent signed and in chart.  Thank you for this interesting consult.  I greatly enjoyed meeting Samir Ishaq and look forward to participating in their care.  A copy of this report was sent to the requesting provider on this date.  Electronically Signed: Docia Barrier, PA 03/05/2020, 10:22 AM   I spent a total of  30 Minutes   in face to face in clinical consultation, greater than 50% of which was counseling/coordinating care for multiple myeloma.

## 2020-03-05 NOTE — Discharge Instructions (Signed)
Please call Interventional Radiology clinic [phone] with any questions or concerns.  You may remove your dressing and shower tomorrow.   Bone Marrow Aspiration and Bone Marrow Biopsy, Adult, Care After This sheet gives you information about how to care for yourself after your procedure. Your health care provider may also give you more specific instructions. If you have problems or questions, contact your health care provider. What can I expect after the procedure? After the procedure, it is common to have:  Mild pain and tenderness.  Swelling.  Bruising. Follow these instructions at home: Puncture site care  Follow instructions from your health care provider about how to take care of the puncture site. Make sure you: ? Wash your hands with soap and water before and after you change your bandage (dressing). If soap and water are not available, use hand sanitizer. ? Change your dressing as told by your health care provider.  Check your puncture site every day for signs of infection. Check for: ? More redness, swelling, or pain. ? Fluid or blood. ? Warmth. ? Pus or a bad smell.   Activity  Return to your normal activities as told by your health care provider. Ask your health care provider what activities are safe for you.  Do not lift anything that is heavier than 10 lb (4.5 kg), or the limit that you are told, until your health care provider says that it is safe.  Do not drive for 24 hours if you were given a sedative during your procedure. General instructions  Take over-the-counter and prescription medicines only as told by your health care provider.  Do not take baths, swim, or use a hot tub until your health care provider approves. Ask your health care provider if you may take showers. You may only be allowed to take sponge baths.  If directed, put ice on the affected area. To do this: ? Put ice in a plastic bag. ? Place a towel between your skin and the bag. ? Leave  the ice on for 20 minutes, 2-3 times a day.  Keep all follow-up visits as told by your health care provider. This is important.   Contact a health care provider if:  Your pain is not controlled with medicine.  You have a fever.  You have more redness, swelling, or pain around the puncture site.  You have fluid or blood coming from the puncture site.  Your puncture site feels warm to the touch.  You have pus or a bad smell coming from the puncture site. Summary  After the procedure, it is common to have mild pain, tenderness, swelling, and bruising.  Follow instructions from your health care provider about how to take care of the puncture site and what activities are safe for you.  Take over-the-counter and prescription medicines only as told by your health care provider.  Contact a health care provider if you have any signs of infection, such as fluid or blood coming from the puncture site. This information is not intended to replace advice given to you by your health care provider. Make sure you discuss any questions you have with your health care provider. Document Revised: 06/19/2018 Document Reviewed: 06/19/2018 Elsevier Patient Education  2021 Elsevier Inc.   Moderate Conscious Sedation, Adult, Care After This sheet gives you information about how to care for yourself after your procedure. Your health care provider may also give you more specific instructions. If you have problems or questions, contact your health care provider. What   soon. °Follow these instructions at home: °For the time period you were told by your health care provider: °Rest. °Do not participate in activities where you could fall or become injured. °Do not drive or use machinery. °Do not drink alcohol. °Do not take sleeping pills or medicines that cause drowsiness. °Do not  make important decisions or sign legal documents. °Do not take care of children on your own.  °  °  °Eating and drinking °Follow the diet recommended by your health care provider. °Drink enough fluid to keep your urine pale yellow. °If you vomit: °Drink water, juice, or soup when you can drink without vomiting. °Make sure you have little or no nausea before eating solid foods.   °General instructions °Take over-the-counter and prescription medicines only as told by your health care provider. °Have a responsible adult stay with you for the time you are told. It is important to have someone help care for you until you are awake and alert. °Do not smoke. °Keep all follow-up visits as told by your health care provider. This is important. °Contact a health care provider if: °You are still sleepy or having trouble with balance after 24 hours. °You feel light-headed. °You keep feeling nauseous or you keep vomiting. °You develop a rash. °You have a fever. °You have redness or swelling around the IV site. °Get help right away if: °You have trouble breathing. °You have new-onset confusion at home. °Summary °After the procedure, it is common to feel sleepy, have impaired judgment, or feel nauseous if you eat too soon. °Rest after you get home. Know the things you should not do after the procedure. °Follow the diet recommended by your health care provider and drink enough fluid to keep your urine pale yellow. °Get help right away if you have trouble breathing or new-onset confusion at home. °This information is not intended to replace advice given to you by your health care provider. Make sure you discuss any questions you have with your health care provider. °Document Revised: 05/31/2019 Document Reviewed: 12/27/2018 °Elsevier Patient Education © 2021 Elsevier Inc.  °

## 2020-03-05 NOTE — Procedures (Signed)
  Procedure: CT bone marrow biopsy R iliac EBL:   minimal Complications:  none immediate  See full dictation in [facility].  Dillard Cannon MD Main # [phone] Pager  [phone] Mobile [phone]

## 2020-03-10 ENCOUNTER — Encounter (HOSPITAL_COMMUNITY): Payer: Self-pay | Admitting: Hematology & Oncology

## 2020-03-11 ENCOUNTER — Other Ambulatory Visit: Payer: Self-pay | Admitting: Sports Medicine

## 2020-03-11 ENCOUNTER — Telehealth: Payer: Self-pay

## 2020-03-11 DIAGNOSIS — N138 Other obstructive and reflux uropathy: Secondary | ICD-10-CM

## 2020-03-11 MED ORDER — AMLODIPINE BESYLATE 5 MG PO TABS: 5.0000 mg | ORAL_TABLET | Freq: Every day | ORAL | 3 refills | Status: DC

## 2020-03-11 MED ORDER — NITROGLYCERIN 0.4 MG SL SUBL: 0.4000 mg | SUBLINGUAL_TABLET | SUBLINGUAL | 3 refills | Status: DC | PRN

## 2020-03-11 MED ORDER — TAMSULOSIN HCL 0.4 MG PO CAPS
0.4000 mg | ORAL_CAPSULE | Freq: Every day | ORAL | 3 refills | Status: DC
Start: 2020-03-11 — End: 2021-08-04

## 2020-03-11 NOTE — Telephone Encounter (Signed)
FISH Analysis received via fax and copy placed in Dr Marin Olp inbox. Original to be scanned. dph

## 2020-03-12 ENCOUNTER — Encounter: Payer: Self-pay | Admitting: Hematology & Oncology

## 2020-03-12 ENCOUNTER — Inpatient Hospital Stay: Payer: Medicare Other | Attending: Hematology & Oncology | Admitting: Hematology & Oncology

## 2020-03-12 ENCOUNTER — Other Ambulatory Visit: Payer: Self-pay

## 2020-03-12 ENCOUNTER — Inpatient Hospital Stay: Payer: Medicare Other

## 2020-03-12 ENCOUNTER — Other Ambulatory Visit: Payer: Self-pay | Admitting: Sports Medicine

## 2020-03-12 VITALS — BP 138/65 | HR 66 | Temp 98.0°F | Resp 17 | Wt 201.0 lb

## 2020-03-12 DIAGNOSIS — C9002 Multiple myeloma in relapse: Secondary | ICD-10-CM | POA: Diagnosis not present

## 2020-03-12 DIAGNOSIS — C9000 Multiple myeloma not having achieved remission: Secondary | ICD-10-CM | POA: Diagnosis not present

## 2020-03-12 LAB — CMP (CANCER CENTER ONLY)
ALT: 17 U/L (ref 0–44)
AST: 18 U/L (ref 15–41)
Albumin: 4.1 g/dL (ref 3.5–5.0)
Alkaline Phosphatase: 87 U/L (ref 38–126)
Anion gap: 6 (ref 5–15)
BUN: 15 mg/dL (ref 8–23)
CO2: 31 mmol/L (ref 22–32)
Calcium: 9.9 mg/dL (ref 8.9–10.3)
Chloride: 102 mmol/L (ref 98–111)
Creatinine: 1.04 mg/dL (ref 0.61–1.24)
GFR, Estimated: >60 mL/min (ref >60)
Glucose, Bld: 90 mg/dL (ref 70–99)
Potassium: 3.8 mmol/L (ref 3.5–5.1)
Sodium: 139 mmol/L (ref 135–145)
Total Bilirubin: 0.6 mg/dL (ref 0.3–1.2)
Total Protein: 6.9 g/dL (ref 6.5–8.1)

## 2020-03-12 LAB — CBC WITH DIFFERENTIAL (CANCER CENTER ONLY)
Abs Immature Granulocytes: 0.02 10*3/uL (ref 0.00–0.07)
Basophils Absolute: 0 10*3/uL (ref 0.0–0.1)
Basophils Relative: 1 %
Eosinophils Absolute: 0.2 10*3/uL (ref 0.0–0.5)
Eosinophils Relative: 3 %
HCT: 38.3 % — ABNORMAL LOW (ref 39.0–52.0)
Hemoglobin: 12.6 g/dL — ABNORMAL LOW (ref 13.0–17.0)
Immature Granulocytes: 0 %
Lymphocytes Relative: 29 %
Lymphs Abs: 1.8 10*3/uL (ref 0.7–4.0)
MCH: 30 pg (ref 26.0–34.0)
MCHC: 32.9 g/dL (ref 30.0–36.0)
MCV: 91.2 fL (ref 80.0–100.0)
Monocytes Absolute: 0.6 10*3/uL (ref 0.1–1.0)
Monocytes Relative: 10 %
Neutro Abs: 3.5 10*3/uL (ref 1.7–7.7)
Neutrophils Relative %: 57 %
Platelet Count: 190 10*3/uL (ref 150–400)
RBC: 4.2 MIL/uL — ABNORMAL LOW (ref 4.22–5.81)
RDW: 14.1 % (ref 11.5–15.5)
WBC Count: 6.1 10*3/uL (ref 4.0–10.5)
nRBC: 0 % (ref 0.0–0.2)

## 2020-03-12 LAB — LACTATE DEHYDROGENASE: LDH: 117 U/L (ref 98–192)

## 2020-03-12 MED ORDER — LORAZEPAM 1 MG PO TABS: 1.0000 mg | ORAL_TABLET | Freq: Every day | ORAL | 0 refills | Status: DC

## 2020-03-12 MED ORDER — FLUTICASONE PROPIONATE 50 MCG/ACT NA SUSP: 2.0000 | Freq: Every day | NASAL | 2 refills | Status: DC

## 2020-03-12 MED ORDER — HYDROCHLOROTHIAZIDE 12.5 MG PO CAPS: 12.5000 mg | ORAL_CAPSULE | Freq: Every day | ORAL | 3 refills | Status: DC

## 2020-03-12 MED ORDER — GABAPENTIN 600 MG PO TABS: 600.0000 mg | ORAL_TABLET | Freq: Three times a day (TID) | ORAL | 3 refills | Status: DC

## 2020-03-12 MED ORDER — CARVEDILOL 6.25 MG PO TABS: 6.2500 mg | ORAL_TABLET | Freq: Two times a day (BID) | ORAL | 3 refills | Status: DC

## 2020-03-12 MED ORDER — CETIRIZINE HCL 10 MG PO TABS: 10.0000 mg | ORAL_TABLET | Freq: Every day | ORAL | 3 refills | Status: DC

## 2020-03-12 MED ORDER — LEVOTHYROXINE SODIUM 100 MCG PO TABS: 100.0000 ug | ORAL_TABLET | Freq: Every day | ORAL | 3 refills | Status: DC

## 2020-03-12 NOTE — Progress Notes (Signed)
Hematology and Oncology Follow Up Visit  [name] [name] [id number] September 11, 1940 80 y.o. 03/12/2020   Principle Diagnosis:  IgG kappa myeloma - Relapsed -- 1q duplication Acute thromboembolism of the right lower leg  Past Therapy: S/P ASCT at [facility] on 04/02/2015 Patient s/p cycle 5 of Velcade/Revlimid/Decadron Revlimid 10mg  po q day (21/28) -- d/c on 09/15/2017  Current Therapy:     Faspro/Velcade/Decadron -- start cycle #1 on 03/20/2020    Zometa 4 mg IV every3 months - next dose due 04/2020 Xarelto 10 mg by mouth daily   Interim History:  [name] [name] is here today for follow-up.  Thankfully, his wife comes in with him.  It has been a while since we last saw her.  Unfortunately, I think that he has recurred.  We actually had a bone marrow biopsy done on him.  This was done on 03/05/2020.  The pathology report ([id number]) showed a hypercellular marrow with only 4% plasma cells.  I am surprised that there was only 4% plasma cells.  A lot of plasma cells were atypical however.  We did do cytogenetics and FISH.  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  Of note, we had first seen him several years ago, he had a trisomy 11 abnormality.  We did do a 24-hour urine on him.  This showed a marked increase in the Kappa light chain.  He was now producing 176 mg/L of light chain.  His last kappa light chain in the serum was 11.3 mg/dL.  Again, I does believe that we were looking at a relapsed disease.  He has a new chromosomal abnormality.  He is making light chains for the most part.  It has been about 5 years since he had his transplant.  Prior to his transplant he got into remission with Velcade/Revlimid/Decadron.  I think that we can probably go without any IV therapy.  I think that he would be a good candidate for Faspro/Velcade/Decadron.  I think this would be effective.  The question is whether or not he would be another transplant candidate.  He is still in great  shape.  His age is on the high side for transplant however.  He has a good performance status.  Thankfully, there has been no problems with the COVID.  His appetite has been good.  He has some arthritic issues.  He has had some back discomfort in the past which is nothing to do with the myeloma.  Currently, I would say his performance status is ECOG 1.   Medications:  Allergies as of 03/12/2020      Reactions   Budesonide-formoterol Fumarate Itching, Other (See Comments)   Codeine Itching   Hydrocodone-acetaminophen Itching   Mushroom Extract Complex Nausea And Vomiting, Other (See Comments)   Only shitake mushroom  causes this reaction. Flu-like symptoms from portabello mushrooms      Medication List       Accurate as of March 12, 2020  8:58 AM. If you have any questions, ask your nurse or doctor.        amLODipine 5 MG tablet Commonly known as: NORVASC Take 1 tablet (5 mg total) by mouth daily.   aspirin EC 81 MG tablet Take by mouth.   benzonatate 200 MG capsule Commonly known as: TESSALON Take 1 capsule (200 mg total) by mouth 3 (three) times daily as needed for cough.   carvedilol 6.25 MG tablet Commonly known as: COREG TAKE 1 TABLET TWICE DAILY  WITH MEALS  cetirizine 10 MG tablet Commonly known as: EQ Allergy Relief (Cetirizine) Take 1 tablet (10 mg total) by mouth daily.   Cyanocobalamin 1000 MCG Tbcr Take 1 tablet by mouth daily.   esomeprazole 40 MG capsule Commonly known as: NEXIUM TAKE 1 CAPSULE DAILY AT 12 NOON   famciclovir 500 MG tablet Commonly known as: FAMVIR TAKE 1 TABLET DAILY   fluticasone 50 MCG/ACT nasal spray Commonly known as: FLONASE Place 2 sprays into both nostrils daily.   gabapentin 600 MG tablet Commonly known as: NEURONTIN TAKE 1 TABLET EVERY MORNINGAND 2 TABLETS EVERY NIGHT   hydrochlorothiazide 12.5 MG capsule Commonly known as: MICROZIDE TAKE 1 CAPSULE DAILY   isosorbide mononitrate 30 MG 24 hr tablet Commonly  known as: IMDUR Take 1 tablet (30 mg total) by mouth 2 (two) times daily.   levothyroxine 100 MCG tablet Commonly known as: Synthroid Take 1 tablet (100 mcg total) by mouth daily before breakfast.   LORazepam 1 MG tablet Commonly known as: ATIVAN Take 1 tablet (1 mg total) by mouth at bedtime.   lubiprostone 24 MCG capsule Commonly known as: Amitiza 1 capsule 1-2 times daily for opiate-induced constipation   nitroGLYCERIN 0.4 MG SL tablet Commonly known as: NITROSTAT Place 1 tablet (0.4 mg total) under the tongue every 5 (five) minutes as needed. For chest pain   oxyCODONE-acetaminophen 10-325 MG tablet Commonly known as: PERCOCET Take 1 tablet by mouth every 8 (eight) hours as needed.   potassium chloride SA 20 MEQ tablet Commonly known as: Klor-Con M20 Take 1 tablet (20 mEq total) by mouth 2 (two) times daily.   pyridoxine 100 MG tablet Commonly known as: B-6 Take 100 mg by mouth daily.   rivaroxaban 10 MG Tabs tablet Commonly known as: XARELTO Take 1 tablet (10 mg total) by mouth daily with supper.   tamsulosin 0.4 MG Caps capsule Commonly known as: FLOMAX Take 1 capsule (0.4 mg total) by mouth daily after supper.       Allergies:  Allergies  Allergen Reactions  . Budesonide-Formoterol Fumarate Itching and Other (See Comments)  . Codeine Itching  . Hydrocodone-Acetaminophen Itching  . Mushroom Extract Complex Nausea And Vomiting and Other (See Comments)    Only shitake mushroom  causes this reaction. Flu-like symptoms from portabello mushrooms    Past Medical History, Surgical history, Social history, and Family History were reviewed and updated.  Review of Systems: Review of Systems  HENT: Negative.   Eyes: Negative.   Respiratory: Negative.   Cardiovascular: Positive for chest pain.  Gastrointestinal: Negative.   Genitourinary: Negative.   Musculoskeletal: Positive for back pain.  Skin: Negative.   Neurological: Negative.   Endo/Heme/Allergies:  Negative.   Psychiatric/Behavioral: Negative.      Physical Exam:  weight is 201 lb (91.2 kg). His oral temperature is 98 F (36.7 C). His blood pressure is 138/65 and his pulse is 66. His respiration is 17 and oxygen saturation is 96%.   Wt Readings from Last 3 Encounters:  03/12/20 201 lb (91.2 kg)  03/05/20 195 lb (88.5 kg)  01/31/20 195 lb (88.5 kg)    Physical Exam Vitals reviewed.  HENT:     Head: Normocephalic and atraumatic.  Eyes:     Pupils: Pupils are equal, round, and reactive to light.  Cardiovascular:     Rate and Rhythm: Normal rate and regular rhythm.     Heart sounds: Normal heart sounds.  Pulmonary:     Effort: Pulmonary effort is normal.     Breath  sounds: Normal breath sounds.  Abdominal:     General: Bowel sounds are normal.     Palpations: Abdomen is soft.  Musculoskeletal:        General: No tenderness or deformity. Normal range of motion.     Cervical back: Normal range of motion.  Lymphadenopathy:     Cervical: No cervical adenopathy.  Skin:    General: Skin is warm and dry.     Findings: No erythema or rash.  Neurological:     Mental Status: He is alert and oriented to person, place, and time.  Psychiatric:        Behavior: Behavior normal.        Thought Content: Thought content normal.        Judgment: Judgment normal.      Lab Results  Component Value Date   WBC 7.1 03/05/2020   HGB 13.4 03/05/2020   HCT 40.6 03/05/2020   MCV 91.2 03/05/2020   PLT 184 03/05/2020   Lab Results  Component Value Date   FERRITIN 199 07/18/2019   IRON 65 07/18/2019   TIBC 316 07/18/2019   UIBC 251 07/18/2019   IRONPCTSAT 21 07/18/2019   Lab Results  Component Value Date   RETICCTPCT 1.7 07/18/2019   RBC 4.45 03/05/2020   Lab Results  Component Value Date   KPAFRELGTCHN 113.1 (H) 01/31/2020   LAMBDASER 9.9 01/31/2020   KAPLAMBRATIO 38.25 (H) 02/03/2020   Lab Results  Component Value Date   IGGSERUM 866 01/31/2020   IGA 64 01/31/2020    IGMSERUM 49 01/31/2020   Lab Results  Component Value Date   TOTALPROTELP 6.4 01/31/2020   ALBUMINELP 3.4 01/31/2020   A1GS 0.2 01/31/2020   A2GS 0.9 01/31/2020   BETS 1.0 01/31/2020   BETA2SER 0.3 02/11/2015   GAMS 0.8 01/31/2020   MSPIKE Not Observed 01/31/2020   SPEI Comment 10/30/2019     Chemistry      Component Value Date/Time   NA 140 03/05/2020 0940   NA 145 (H) 06/28/2019 1501   NA 140 01/27/2017 1044   NA 141 06/05/2015 1036   K 3.5 03/05/2020 0940   K 3.3 01/27/2017 1044   K 2.9 (LL) 06/05/2015 1036   CL 104 03/05/2020 0940   CL 101 01/27/2017 1044   CO2 23 03/05/2020 0940   CO2 28 01/27/2017 1044   CO2 27 06/05/2015 1036   BUN 17 03/05/2020 0940   BUN 15 06/28/2019 1501   BUN 14 01/27/2017 1044   BUN 13.1 06/05/2015 1036   CREATININE 1.19 03/05/2020 0940   CREATININE 1.06 01/31/2020 1334   CREATININE 1.16 10/08/2018 1531   CREATININE 1.0 06/05/2015 1036   GLU 152 03/30/2016 0000      Component Value Date/Time   CALCIUM 9.3 03/05/2020 0940   CALCIUM 8.9 01/27/2017 1044   CALCIUM 9.4 06/05/2015 1036   ALKPHOS 90 01/31/2020 1334   ALKPHOS 126 (H) 01/27/2017 1044   ALKPHOS 94 06/05/2015 1036   AST 19 01/31/2020 1334   AST 22 06/05/2015 1036   ALT 21 01/31/2020 1334   ALT 33 01/27/2017 1044   ALT 18 06/05/2015 1036   BILITOT 0.6 01/31/2020 1334   BILITOT 0.59 06/05/2015 1036       Impression and Plan: [name] [name] is a very pleasant 80 yo caucasian gentleman with history of IgG kappa myeloma.  He underwent standard induction chemotherapy and then ultimately stem cell transplant at [facility] in February 2017.   Again, I believe that we  are looking at a relapse in his myeloma.  Again this is producing light chains for the most part.  He has a different chromosomal abnormality.  We will start him on treatment with FVd.  I think this would be very reasonable.  I suspect we should get a good response since he has had no treatment for several years.  I  spent about 45 minutes or so with he and his wife.  They are such good people.  It is always fun talking to him with them.  I still feel very confident that we will be able to get him back into remission.  We will start treatment next week.  I will plan to see him back in about a month.   [name], MD 1/27/20228:58 AM

## 2020-03-12 NOTE — Progress Notes (Signed)
START ON PATHWAY REGIMEN - Multiple Myeloma and Other Plasma Cell Dyscrasias     Cycles 1 through 3: A cycle is every 21 days:     Dexamethasone      Bortezomib      Daratumumab    Cycles 4 through 8: A cycle is every 21 days:     Dexamethasone      Bortezomib      Daratumumab    Cycles 9 and beyond: A cycle is every 28 days:     Daratumumab   **Always confirm dose/schedule in your pharmacy ordering system**  Patient Characteristics: Multiple Myeloma, Relapsed / Refractory, Second through Fourth Lines of Therapy, Fit or Candidate for Triplet Therapy, Lenalidomide-Refractory or Lenalidomide-based Regimen Not Preferred, Candidate for Anti-CD38 Antibody Disease Classification: Multiple Myeloma R-ISS Staging: Unknown Therapeutic Status: Relapsed Line of Therapy: Third Line Anti-CD38 Antibody Candidacy: Candidate for Anti-CD38 Antibody Lenalidomide-based Regimen Preference/Candidacy: Lenalidomide-based Regimen Not Preferred Intent of Therapy: Non-Curative / Palliative Intent, Discussed with Patient

## 2020-03-13 ENCOUNTER — Telehealth: Payer: Self-pay

## 2020-03-13 LAB — KAPPA/LAMBDA LIGHT CHAINS
Kappa free light chain: 216 mg/L — ABNORMAL HIGH (ref 3.3–19.4)
Kappa, lambda light chain ratio: 22.98 — ABNORMAL HIGH (ref 0.26–1.65)
Lambda free light chains: 9.4 mg/L (ref 5.7–26.3)

## 2020-03-13 LAB — IGG, IGA, IGM
IgA: 71 mg/dL (ref 61–437)
IgG (Immunoglobin G), Serum: 950 mg/dL (ref 603–1613)
IgM (Immunoglobulin M), Srm: 43 mg/dL (ref 15–143)

## 2020-03-13 NOTE — Telephone Encounter (Signed)
S/w pt and he is aware of his appt per 03/12/20 los, pt states he will also view them on my chart  [name] [name]

## 2020-03-13 NOTE — Progress Notes (Signed)
Pharmacist Chemotherapy Monitoring - Initial Assessment    Anticipated start date: 03/20/20     Regimen:  . Are orders appropriate based on the patient's diagnosis, regimen, and cycle? Yes . Does the plan date match the patient's scheduled date? Yes . Is the sequencing of drugs appropriate? Yes . Are the premedications appropriate for the patient's regimen? Yes . Prior Authorization for treatment is: Approved o If applicable, is the correct biosimilar selected based on the patient's insurance? not applicable  Organ Function and Labs: Marland Kitchen Are dose adjustments needed based on the patient's renal function, hepatic function, or hematologic function? No . Are appropriate labs ordered prior to the start of patient's treatment? Yes . Other organ system assessment, if indicated: N/A . The following baseline labs, if indicated, have been ordered: daratumumab: blood type and screen  Dose Assessment: . Are the drug doses appropriate? Yes . Are the following correct: o Drug concentrations Yes o IV fluid compatible with drug Yes o Administration routes Yes o Timing of therapy Yes . If applicable, does the patient have documented access for treatment and/or plans for port-a-cath placement? not applicable . If applicable, have lifetime cumulative doses been properly documented and assessed? not applicable Lifetime Dose Tracking  No doses have been documented on this patient for the following tracked chemicals: Doxorubicin, Epirubicin, Idarubicin, Daunorubicin, Mitoxantrone, Bleomycin, Oxaliplatin, Carboplatin, Liposomal Doxorubicin  o   Toxicity Monitoring/Prevention: . The patient has the following take home antiemetics prescribed: Ondansetron, Prochlorperazine, Dexamethasone and Lorazepam . The patient has the following take home medications prescribed: VZV prophylaxis . Medication allergies and previous infusion related reactions, if applicable, have been reviewed and addressed. Yes . The  patient's current medication list has been assessed for drug-drug interactions with their chemotherapy regimen. no significant drug-drug interactions were identified on review.  Order Review: . Are the treatment plan orders signed? No . Is the patient scheduled to see a provider prior to their treatment? No  I verify that I have reviewed each item in the above checklist and answered each question accordingly.  Devonne Kitchen, Jacqlyn Larsen 03/13/2020 12:10 PM

## 2020-03-16 ENCOUNTER — Encounter (HOSPITAL_COMMUNITY): Payer: Self-pay | Admitting: Hematology & Oncology

## 2020-03-16 LAB — PROTEIN ELECTROPHORESIS, SERUM, WITH REFLEX
A/G Ratio: 1.3 (ref 0.7–1.7)
Albumin ELP: 3.5 g/dL (ref 2.9–4.4)
Alpha-1-Globulin: 0.2 g/dL (ref 0.0–0.4)
Alpha-2-Globulin: 0.8 g/dL (ref 0.4–1.0)
Beta Globulin: 1 g/dL (ref 0.7–1.3)
Gamma Globulin: 0.8 g/dL (ref 0.4–1.8)
Globulin, Total: 2.8 g/dL (ref 2.2–3.9)
Total Protein ELP: 6.3 g/dL (ref 6.0–8.5)

## 2020-03-17 ENCOUNTER — Other Ambulatory Visit: Payer: Self-pay | Admitting: *Deleted

## 2020-03-17 MED ORDER — FAMCICLOVIR 500 MG PO TABS: 500.0000 mg | ORAL_TABLET | Freq: Every day | ORAL | 3 refills | Status: DC

## 2020-03-19 ENCOUNTER — Other Ambulatory Visit: Payer: Self-pay | Admitting: *Deleted

## 2020-03-19 DIAGNOSIS — C9000 Multiple myeloma not having achieved remission: Secondary | ICD-10-CM

## 2020-03-20 ENCOUNTER — Inpatient Hospital Stay: Payer: Medicare Other

## 2020-03-20 ENCOUNTER — Other Ambulatory Visit: Payer: Self-pay

## 2020-03-20 ENCOUNTER — Inpatient Hospital Stay: Payer: Medicare Other | Attending: Hematology & Oncology

## 2020-03-20 ENCOUNTER — Other Ambulatory Visit: Payer: Self-pay | Admitting: *Deleted

## 2020-03-20 VITALS — BP 131/71 | HR 65 | Temp 98.1°F | Resp 16

## 2020-03-20 DIAGNOSIS — C9002 Multiple myeloma in relapse: Secondary | ICD-10-CM | POA: Diagnosis not present

## 2020-03-20 DIAGNOSIS — Z7901 Long term (current) use of anticoagulants: Secondary | ICD-10-CM | POA: Insufficient documentation

## 2020-03-20 DIAGNOSIS — Z5112 Encounter for antineoplastic immunotherapy: Secondary | ICD-10-CM | POA: Diagnosis not present

## 2020-03-20 DIAGNOSIS — C9000 Multiple myeloma not having achieved remission: Secondary | ICD-10-CM

## 2020-03-20 LAB — CMP (CANCER CENTER ONLY)
ALT: 19 U/L (ref 0–44)
AST: 21 U/L (ref 15–41)
Albumin: 4 g/dL (ref 3.5–5.0)
Alkaline Phosphatase: 73 U/L (ref 38–126)
Anion gap: 8 (ref 5–15)
BUN: 17 mg/dL (ref 8–23)
CO2: 29 mmol/L (ref 22–32)
Calcium: 9.2 mg/dL (ref 8.9–10.3)
Chloride: 102 mmol/L (ref 98–111)
Creatinine: 1.12 mg/dL (ref 0.61–1.24)
GFR, Estimated: >60 mL/min (ref >60)
Glucose, Bld: 110 mg/dL — ABNORMAL HIGH (ref 70–99)
Potassium: 3.4 mmol/L — ABNORMAL LOW (ref 3.5–5.1)
Sodium: 139 mmol/L (ref 135–145)
Total Bilirubin: 0.9 mg/dL (ref 0.3–1.2)
Total Protein: 6.5 g/dL (ref 6.5–8.1)

## 2020-03-20 LAB — CBC WITH DIFFERENTIAL (CANCER CENTER ONLY)
Abs Immature Granulocytes: 0.02 10*3/uL (ref 0.00–0.07)
Basophils Absolute: 0 10*3/uL (ref 0.0–0.1)
Basophils Relative: 1 %
Eosinophils Absolute: 0.2 10*3/uL (ref 0.0–0.5)
Eosinophils Relative: 3 %
HCT: 35.7 % — ABNORMAL LOW (ref 39.0–52.0)
Hemoglobin: 12.1 g/dL — ABNORMAL LOW (ref 13.0–17.0)
Immature Granulocytes: 0 %
Lymphocytes Relative: 32 %
Lymphs Abs: 2.1 10*3/uL (ref 0.7–4.0)
MCH: 30 pg (ref 26.0–34.0)
MCHC: 33.9 g/dL (ref 30.0–36.0)
MCV: 88.6 fL (ref 80.0–100.0)
Monocytes Absolute: 0.8 10*3/uL (ref 0.1–1.0)
Monocytes Relative: 12 %
Neutro Abs: 3.5 10*3/uL (ref 1.7–7.7)
Neutrophils Relative %: 52 %
Platelet Count: 172 10*3/uL (ref 150–400)
RBC: 4.03 MIL/uL — ABNORMAL LOW (ref 4.22–5.81)
RDW: 13.7 % (ref 11.5–15.5)
WBC Count: 6.7 10*3/uL (ref 4.0–10.5)
nRBC: 0 % (ref 0.0–0.2)

## 2020-03-20 LAB — SURGICAL PATHOLOGY

## 2020-03-20 LAB — ABO/RH: ABO/RH(D): O POS

## 2020-03-20 LAB — TYPE AND SCREEN
ABO/RH(D): O POS
Antibody Screen: NEGATIVE

## 2020-03-20 LAB — PRETREATMENT RBC PHENOTYPE

## 2020-03-20 MED ORDER — ONDANSETRON HCL 8 MG PO TABS: 8.0000 mg | ORAL_TABLET | Freq: Two times a day (BID) | ORAL | 1 refills | Status: DC | PRN

## 2020-03-20 MED ORDER — ACETAMINOPHEN 325 MG PO TABS
ORAL_TABLET | ORAL | Status: AC
  Filled 2020-03-20: qty 2

## 2020-03-20 MED ORDER — DIPHENHYDRAMINE HCL 25 MG PO CAPS
50.0000 mg | ORAL_CAPSULE | Freq: Once | ORAL | Status: AC
  Administered 2020-03-20: 50 mg via ORAL

## 2020-03-20 MED ORDER — PROCHLORPERAZINE MALEATE 10 MG PO TABS: 10.0000 mg | ORAL_TABLET | Freq: Four times a day (QID) | ORAL | 1 refills | Status: DC | PRN

## 2020-03-20 MED ORDER — BORTEZOMIB CHEMO SQ INJECTION 3.5 MG (2.5MG/ML)
1.3000 mg/m2 | Freq: Once | INTRAMUSCULAR | Status: AC
  Administered 2020-03-20: 2.75 mg via SUBCUTANEOUS
  Filled 2020-03-20: qty 1.1

## 2020-03-20 MED ORDER — ACYCLOVIR 400 MG PO TABS: 400.0000 mg | ORAL_TABLET | Freq: Two times a day (BID) | ORAL | 5 refills | Status: DC

## 2020-03-20 MED ORDER — DEXAMETHASONE 4 MG PO TABS
20.0000 mg | ORAL_TABLET | Freq: Once | ORAL | Status: AC
  Administered 2020-03-20: 20 mg via ORAL

## 2020-03-20 MED ORDER — DIPHENHYDRAMINE HCL 25 MG PO CAPS
ORAL_CAPSULE | ORAL | Status: AC
  Filled 2020-03-20: qty 2

## 2020-03-20 MED ORDER — ACETAMINOPHEN 325 MG PO TABS
650.0000 mg | ORAL_TABLET | Freq: Once | ORAL | Status: AC
  Administered 2020-03-20: 650 mg via ORAL

## 2020-03-20 MED ORDER — DEXAMETHASONE 4 MG PO TABS: ORAL_TABLET | ORAL | 7 refills | Status: DC

## 2020-03-20 MED ORDER — DEXAMETHASONE 4 MG PO TABS
ORAL_TABLET | ORAL | Status: AC
  Filled 2020-03-20: qty 5

## 2020-03-20 MED ORDER — LORAZEPAM 0.5 MG PO TABS: 0.5000 mg | ORAL_TABLET | Freq: Four times a day (QID) | ORAL | 0 refills | Status: DC | PRN

## 2020-03-20 MED ORDER — PROCHLORPERAZINE MALEATE 10 MG PO TABS
ORAL_TABLET | ORAL | Status: AC
  Filled 2020-03-20: qty 1

## 2020-03-20 MED ORDER — MONTELUKAST SODIUM 10 MG PO TABS
10.0000 mg | ORAL_TABLET | Freq: Once | ORAL | Status: AC
  Administered 2020-03-20: 10 mg via ORAL
  Filled 2020-03-20: qty 1

## 2020-03-20 MED ORDER — PROCHLORPERAZINE MALEATE 10 MG PO TABS
10.0000 mg | ORAL_TABLET | Freq: Once | ORAL | Status: AC
  Administered 2020-03-20: 10 mg via ORAL

## 2020-03-20 MED ORDER — DARATUMUMAB-HYALURONIDASE-FIHJ 1800-30000 MG-UT/15ML ~~LOC~~ SOLN
1800.0000 mg | Freq: Once | SUBCUTANEOUS | Status: AC
  Administered 2020-03-20: 1800 mg via SUBCUTANEOUS
  Filled 2020-03-20: qty 15

## 2020-03-20 NOTE — Patient Instructions (Signed)
Bortezomib injection What is this medicine? BORTEZOMIB (bor TEZ oh mib) targets proteins in cancer cells and stops the cancer cells from growing. It treats multiple myeloma and mantle cell lymphoma. This medicine may be used for other purposes; ask your health care provider or pharmacist if you have questions. COMMON BRAND NAME(S): Velcade What should I tell my health care provider before I take this medicine? They need to know if you have any of these conditions:  dehydration  diabetes (high blood sugar)  heart disease  liver disease  tingling of the fingers or toes or other nerve disorder  an unusual or allergic reaction to bortezomib, mannitol, boron, other medicines, foods, dyes, or preservatives  pregnant or trying to get pregnant  breast-feeding How should I use this medicine? This medicine is injected into a vein or under the skin. It is given by a health care provider in a hospital or clinic setting. Talk to your health care provider about the use of this medicine in children. Special care may be needed. Overdosage: If you think you have taken too much of this medicine contact a poison control center or emergency room at once. NOTE: This medicine is only for you. Do not share this medicine with others. What if I miss a dose? Keep appointments for follow-up doses. It is important not to miss your dose. Call your health care provider if you are unable to keep an appointment. What may interact with this medicine? This medicine may interact with the following medications:  ketoconazole  rifampin This list may not describe all possible interactions. Give your health care provider a list of all the medicines, herbs, non-prescription drugs, or dietary supplements you use. Also tell them if you smoke, drink alcohol, or use illegal drugs. Some items may interact with your medicine. What should I watch for while using this medicine? Your condition will be monitored carefully while  you are receiving this medicine. You may need blood work done while you are taking this medicine. You may get drowsy or dizzy. Do not drive, use machinery, or do anything that needs mental alertness until you know how this medicine affects you. Do not stand up or sit up quickly, especially if you are an older patient. This reduces the risk of dizzy or fainting spells This medicine may increase your risk of getting an infection. Call your health care provider for advice if you get a fever, chills, sore throat, or other symptoms of a cold or flu. Do not treat yourself. Try to avoid being around people who are sick. Check with your health care provider if you have severe diarrhea, nausea, and vomiting, or if you sweat a lot. The loss of too much body fluid may make it dangerous for you to take this medicine. Do not become pregnant while taking this medicine or for 7 months after stopping it. Women should inform their health care provider if they wish to become pregnant or think they might be pregnant. Men should not father a child while taking this medicine and for 4 months after stopping it. There is a potential for serious harm to an unborn child. Talk to your health care provider for more information. Do not breast-feed an infant while taking this medicine or for 2 months after stopping it. This medicine may make it more difficult to get pregnant or father a child. Talk to your health care provider if you are concerned about your fertility. What side effects may I notice from receiving this medicine?   Side effects that you should report to your doctor or health care professional as soon as possible: °· allergic reactions (skin rash; itching or hives; swelling of the face, lips, or tongue) °· bleeding (bloody or black, tarry stools; red or dark brown urine; spitting up blood or brown material that looks like coffee grounds; red spots on the skin; unusual bruising or bleeding from the eye, gums, or  nose) °· blurred vision or changes in vision °· confusion °· constipation °· headache °· heart failure (trouble breathing; fast, irregular heartbeat; sudden weight gain; swelling of the ankles, feet, hands) °· infection (fever, chills, cough, sore throat, pain or trouble passing urine) °· lack or loss of appetite °· liver injury (dark yellow or brown urine; general ill feeling or flu-like symptoms; loss of appetite, right upper belly pain; yellowing of the eyes or skin) °· low blood pressure (dizziness; feeling faint or lightheaded, falls; unusually weak or tired) °· muscle cramps °· pain, redness, or irritation at site where injected °· pain, tingling, numbness in the hands or feet °· seizures °· trouble breathing °· unusual bruising or bleeding °Side effects that usually do not require medical attention (report to your doctor or health care professional if they continue or are bothersome): °· diarrhea °· nausea °· stomach pain °· trouble sleeping °· vomiting °This list may not describe all possible side effects. Call your doctor for medical advice about side effects. You may report side effects to FDA at 1-800-FDA-1088. °Where should I keep my medicine? °This medicine is given in a hospital or clinic. It will not be stored at home. °NOTE: This sheet is a summary. It may not cover all possible information. If you have questions about this medicine, talk to your doctor, pharmacist, or health care provider. °© 2021 [employer] Standard (2020-01-23 13:22:53) °Daratumumab injection °What is this medicine? °DARATUMUMAB (dar a toom ue mab) is a monoclonal antibody. It is used to treat multiple myeloma. °This medicine may be used for other purposes; ask your health care provider or pharmacist if you have questions. °COMMON BRAND NAME(S): DARZALEX °What should I tell my health care provider before I take this medicine? °They need to know if you have any of these conditions: °· hereditary fructose intolerance °· infection  (especially a virus infection such as chickenpox, herpes, or hepatitis B virus) °· lung or breathing disease (asthma, COPD) °· an unusual or allergic reaction to daratumumab, sorbitol, other medicines, foods, dyes, or preservatives °· pregnant or trying to get pregnant °· breast-feeding °How should I use this medicine? °This medicine is for infusion into a vein. It is given by a health care professional in a hospital or clinic setting. °Talk to your pediatrician regarding the use of this medicine in children. Special care may be needed. °Overdosage: If you think you have taken too much of this medicine contact a poison control center or emergency room at once. °NOTE: This medicine is only for you. Do not share this medicine with others. °What if I miss a dose? °Keep appointments for follow-up doses as directed. It is important not to miss your dose. Call your doctor or health care professional if you are unable to keep an appointment. °What may interact with this medicine? °Interactions have not been studied. °This list may not describe all possible interactions. Give your health care provider a list of all the medicines, herbs, non-prescription drugs, or dietary supplements you use. Also tell them if you smoke, drink alcohol, or use illegal drugs. Some items may   interact with your medicine. °What should I watch for while using this medicine? °Your condition will be monitored carefully while you are receiving this medicine. °This medicine can cause serious allergic reactions. To reduce your risk, your health care provider may give you other medicine to take before receiving this one. Be sure to follow the directions from your health care provider. °This medicine can affect the results of blood tests to match your blood type. These changes can last for up to 6 months after the final dose. Your healthcare provider will do blood tests to match your blood type before you start treatment. Tell all of your healthcare  providers that you are being treated with this medicine before receiving a blood transfusion. °This medicine can affect the results of some tests used to determine treatment response; extra tests may be needed to evaluate response. °Do not become pregnant while taking this medicine or for 3 months after stopping it. Women should inform their health care provider if they wish to become pregnant or think they might be pregnant. There is a potential for serious side effects to an unborn child. Talk to your health care provider for more information. Do not breast-feed an infant while taking this medicine. °What side effects may I notice from receiving this medicine? °Side effects that you should report to your doctor or health care professional as soon as possible: °· allergic reactions (skin rash; itching or hives; swelling of the face, lips, or tongue) °· infection (fever, chills, cough, sore throat, pain or difficulty passing urine) °· infusion reaction (dizziness, fast heartbeat, feeling faint or lightheaded, falls, headache, increase in blood pressure, nausea, vomiting, or wheezing or trouble breathing with loud or whistling sounds) °· unusual bleeding or bruising °Side effects that usually do not require medical attention (report to your doctor or health care professional if they continue or are bothersome): °· constipation °· diarrhea °· pain, tingling, numbness in the hands or feet °· swelling of the ankles, feet, hands °· tiredness °This list may not describe all possible side effects. Call your doctor for medical advice about side effects. You may report side effects to FDA at 1-800-FDA-1088. °Where should I keep my medicine? °This drug is given in a hospital or clinic and will not be stored at home. °NOTE: This sheet is a summary. It may not cover all possible information. If you have questions about this medicine, talk to your doctor, pharmacist, or health care provider. °© 2021 [employer] Standard  (2020-01-23 13:28:52) ° °

## 2020-03-23 NOTE — Addendum Note (Signed)
Encounter addended by: Tyara Dassow E, RN on: 03/23/2020 7:12 AM  Actions taken: Charge Capture section accepted

## 2020-03-25 ENCOUNTER — Other Ambulatory Visit: Payer: Self-pay | Admitting: Specialist

## 2020-03-27 ENCOUNTER — Other Ambulatory Visit: Payer: Self-pay

## 2020-03-27 ENCOUNTER — Inpatient Hospital Stay: Payer: Medicare Other

## 2020-03-27 VITALS — BP 125/58 | HR 54 | Temp 98.4°F | Resp 17

## 2020-03-27 DIAGNOSIS — C9002 Multiple myeloma in relapse: Secondary | ICD-10-CM | POA: Diagnosis not present

## 2020-03-27 DIAGNOSIS — C9000 Multiple myeloma not having achieved remission: Secondary | ICD-10-CM

## 2020-03-27 DIAGNOSIS — Z7901 Long term (current) use of anticoagulants: Secondary | ICD-10-CM | POA: Diagnosis not present

## 2020-03-27 DIAGNOSIS — Z5112 Encounter for antineoplastic immunotherapy: Secondary | ICD-10-CM | POA: Diagnosis not present

## 2020-03-27 LAB — CBC WITH DIFFERENTIAL/PLATELET
Abs Immature Granulocytes: 0.02 10*3/uL (ref 0.00–0.07)
Basophils Absolute: 0 10*3/uL (ref 0.0–0.1)
Basophils Relative: 0 %
Eosinophils Absolute: 0.1 10*3/uL (ref 0.0–0.5)
Eosinophils Relative: 2 %
HCT: 37.7 % — ABNORMAL LOW (ref 39.0–52.0)
Hemoglobin: 12.5 g/dL — ABNORMAL LOW (ref 13.0–17.0)
Immature Granulocytes: 0 %
Lymphocytes Relative: 31 %
Lymphs Abs: 1.8 10*3/uL (ref 0.7–4.0)
MCH: 29.8 pg (ref 26.0–34.0)
MCHC: 33.2 g/dL (ref 30.0–36.0)
MCV: 90 fL (ref 80.0–100.0)
Monocytes Absolute: 0.6 10*3/uL (ref 0.1–1.0)
Monocytes Relative: 11 %
Neutro Abs: 3.3 10*3/uL (ref 1.7–7.7)
Neutrophils Relative %: 56 %
Platelets: 165 10*3/uL (ref 150–400)
RBC: 4.19 MIL/uL — ABNORMAL LOW (ref 4.22–5.81)
RDW: 13.5 % (ref 11.5–15.5)
WBC: 5.9 10*3/uL (ref 4.0–10.5)
nRBC: 0 % (ref 0.0–0.2)

## 2020-03-27 LAB — COMPREHENSIVE METABOLIC PANEL
ALT: 30 U/L (ref 0–44)
AST: 16 U/L (ref 15–41)
Albumin: 3.8 g/dL (ref 3.5–5.0)
Alkaline Phosphatase: 84 U/L (ref 38–126)
Anion gap: 5 (ref 5–15)
BUN: 17 mg/dL (ref 8–23)
CO2: 33 mmol/L — ABNORMAL HIGH (ref 22–32)
Calcium: 9.1 mg/dL (ref 8.9–10.3)
Chloride: 102 mmol/L (ref 98–111)
Creatinine, Ser: 1.04 mg/dL (ref 0.61–1.24)
GFR, Estimated: >60 mL/min (ref >60)
Glucose, Bld: 95 mg/dL (ref 70–99)
Potassium: 3.9 mmol/L (ref 3.5–5.1)
Sodium: 140 mmol/L (ref 135–145)
Total Bilirubin: 0.6 mg/dL (ref 0.3–1.2)
Total Protein: 6.1 g/dL — ABNORMAL LOW (ref 6.5–8.1)

## 2020-03-27 MED ORDER — DEXAMETHASONE 4 MG PO TABS
ORAL_TABLET | ORAL | Status: AC
  Filled 2020-03-27: qty 5

## 2020-03-27 MED ORDER — ACETAMINOPHEN 325 MG PO TABS
650.0000 mg | ORAL_TABLET | Freq: Once | ORAL | Status: AC
  Administered 2020-03-27: 650 mg via ORAL

## 2020-03-27 MED ORDER — DIPHENHYDRAMINE HCL 25 MG PO CAPS
ORAL_CAPSULE | ORAL | Status: AC
  Filled 2020-03-27: qty 2

## 2020-03-27 MED ORDER — BORTEZOMIB CHEMO SQ INJECTION 3.5 MG (2.5MG/ML)
1.3000 mg/m2 | Freq: Once | INTRAMUSCULAR | Status: AC
  Administered 2020-03-27: 2.75 mg via SUBCUTANEOUS
  Filled 2020-03-27: qty 1.1

## 2020-03-27 MED ORDER — DIPHENHYDRAMINE HCL 25 MG PO CAPS
50.0000 mg | ORAL_CAPSULE | Freq: Once | ORAL | Status: AC
  Administered 2020-03-27: 50 mg via ORAL

## 2020-03-27 MED ORDER — DEXAMETHASONE 4 MG PO TABS
20.0000 mg | ORAL_TABLET | Freq: Once | ORAL | Status: AC
  Administered 2020-03-27: 20 mg via ORAL

## 2020-03-27 MED ORDER — DIPHENHYDRAMINE HCL 50 MG/ML IJ SOLN
INTRAMUSCULAR | Status: AC
  Filled 2020-03-27: qty 1

## 2020-03-27 MED ORDER — ACETAMINOPHEN 325 MG PO TABS
ORAL_TABLET | ORAL | Status: AC
  Filled 2020-03-27: qty 2

## 2020-03-27 MED ORDER — DARATUMUMAB-HYALURONIDASE-FIHJ 1800-30000 MG-UT/15ML ~~LOC~~ SOLN
1800.0000 mg | Freq: Once | SUBCUTANEOUS | Status: AC
  Administered 2020-03-27: 1800 mg via SUBCUTANEOUS
  Filled 2020-03-27: qty 15

## 2020-03-27 MED ORDER — PROCHLORPERAZINE MALEATE 10 MG PO TABS
10.0000 mg | ORAL_TABLET | Freq: Once | ORAL | Status: AC
  Administered 2020-03-27: 10 mg via ORAL

## 2020-03-27 MED ORDER — MONTELUKAST SODIUM 10 MG PO TABS
10.0000 mg | ORAL_TABLET | Freq: Once | ORAL | Status: AC
  Administered 2020-03-27: 10 mg via ORAL
  Filled 2020-03-27: qty 1

## 2020-03-27 MED ORDER — PROCHLORPERAZINE MALEATE 10 MG PO TABS
ORAL_TABLET | ORAL | Status: AC
  Filled 2020-03-27: qty 1

## 2020-04-03 ENCOUNTER — Other Ambulatory Visit: Payer: Self-pay | Admitting: Sports Medicine

## 2020-04-03 ENCOUNTER — Inpatient Hospital Stay: Payer: Medicare Other

## 2020-04-03 ENCOUNTER — Telehealth: Payer: Self-pay

## 2020-04-03 ENCOUNTER — Other Ambulatory Visit: Payer: Self-pay

## 2020-04-03 VITALS — BP 125/65 | HR 55 | Temp 98.0°F | Resp 16

## 2020-04-03 DIAGNOSIS — C9000 Multiple myeloma not having achieved remission: Secondary | ICD-10-CM

## 2020-04-03 DIAGNOSIS — Z5112 Encounter for antineoplastic immunotherapy: Secondary | ICD-10-CM | POA: Diagnosis not present

## 2020-04-03 DIAGNOSIS — C9002 Multiple myeloma in relapse: Secondary | ICD-10-CM | POA: Diagnosis not present

## 2020-04-03 DIAGNOSIS — Z7901 Long term (current) use of anticoagulants: Secondary | ICD-10-CM | POA: Diagnosis not present

## 2020-04-03 DIAGNOSIS — M47815 Spondylosis without myelopathy or radiculopathy, thoracolumbar region: Secondary | ICD-10-CM

## 2020-04-03 LAB — CBC WITH DIFFERENTIAL (CANCER CENTER ONLY)
Abs Immature Granulocytes: 0.01 10*3/uL (ref 0.00–0.07)
Basophils Absolute: 0 10*3/uL (ref 0.0–0.1)
Basophils Relative: 0 %
Eosinophils Absolute: 0 10*3/uL (ref 0.0–0.5)
Eosinophils Relative: 1 %
HCT: 37.4 % — ABNORMAL LOW (ref 39.0–52.0)
Hemoglobin: 12.5 g/dL — ABNORMAL LOW (ref 13.0–17.0)
Immature Granulocytes: 0 %
Lymphocytes Relative: 25 %
Lymphs Abs: 1.5 10*3/uL (ref 0.7–4.0)
MCH: 30 pg (ref 26.0–34.0)
MCHC: 33.4 g/dL (ref 30.0–36.0)
MCV: 89.7 fL (ref 80.0–100.0)
Monocytes Absolute: 0.6 10*3/uL (ref 0.1–1.0)
Monocytes Relative: 10 %
Neutro Abs: 3.9 10*3/uL (ref 1.7–7.7)
Neutrophils Relative %: 64 %
Platelet Count: 162 10*3/uL (ref 150–400)
RBC: 4.17 MIL/uL — ABNORMAL LOW (ref 4.22–5.81)
RDW: 13.7 % (ref 11.5–15.5)
WBC Count: 6 10*3/uL (ref 4.0–10.5)
nRBC: 0 % (ref 0.0–0.2)

## 2020-04-03 LAB — CMP (CANCER CENTER ONLY)
ALT: 20 U/L (ref 0–44)
AST: 14 U/L — ABNORMAL LOW (ref 15–41)
Albumin: 3.9 g/dL (ref 3.5–5.0)
Alkaline Phosphatase: 75 U/L (ref 38–126)
Anion gap: 7 (ref 5–15)
BUN: 22 mg/dL (ref 8–23)
CO2: 30 mmol/L (ref 22–32)
Calcium: 9.1 mg/dL (ref 8.9–10.3)
Chloride: 103 mmol/L (ref 98–111)
Creatinine: 1.09 mg/dL (ref 0.61–1.24)
GFR, Estimated: >60 mL/min (ref >60)
Glucose, Bld: 111 mg/dL — ABNORMAL HIGH (ref 70–99)
Potassium: 3.6 mmol/L (ref 3.5–5.1)
Sodium: 140 mmol/L (ref 135–145)
Total Bilirubin: 0.6 mg/dL (ref 0.3–1.2)
Total Protein: 6.4 g/dL — ABNORMAL LOW (ref 6.5–8.1)

## 2020-04-03 MED ORDER — DEXAMETHASONE 4 MG PO TABS
20.0000 mg | ORAL_TABLET | Freq: Once | ORAL | Status: AC
Start: 2020-04-03 — End: 2020-04-03
  Administered 2020-04-03: 20 mg via ORAL

## 2020-04-03 MED ORDER — OXYCODONE-ACETAMINOPHEN 10-325 MG PO TABS: 1.0000 | ORAL_TABLET | Freq: Three times a day (TID) | ORAL | 0 refills | Status: DC | PRN

## 2020-04-03 MED ORDER — DIPHENHYDRAMINE HCL 25 MG PO CAPS
50.0000 mg | ORAL_CAPSULE | Freq: Once | ORAL | Status: AC
  Administered 2020-04-03: 50 mg via ORAL

## 2020-04-03 MED ORDER — MONTELUKAST SODIUM 10 MG PO TABS
10.0000 mg | ORAL_TABLET | Freq: Once | ORAL | Status: AC
  Administered 2020-04-03: 10 mg via ORAL
  Filled 2020-04-03: qty 1

## 2020-04-03 MED ORDER — DEXAMETHASONE 4 MG PO TABS
ORAL_TABLET | ORAL | Status: AC
  Filled 2020-04-03: qty 5

## 2020-04-03 MED ORDER — DARATUMUMAB-HYALURONIDASE-FIHJ 1800-30000 MG-UT/15ML ~~LOC~~ SOLN
1800.0000 mg | Freq: Once | SUBCUTANEOUS | Status: AC
  Administered 2020-04-03: 1800 mg via SUBCUTANEOUS
  Filled 2020-04-03: qty 15

## 2020-04-03 MED ORDER — ACETAMINOPHEN 325 MG PO TABS
ORAL_TABLET | ORAL | Status: AC
  Filled 2020-04-03: qty 2

## 2020-04-03 MED ORDER — PROCHLORPERAZINE MALEATE 10 MG PO TABS
ORAL_TABLET | ORAL | Status: AC
  Filled 2020-04-03: qty 1

## 2020-04-03 MED ORDER — ACETAMINOPHEN 325 MG PO TABS
650.0000 mg | ORAL_TABLET | Freq: Once | ORAL | Status: AC
  Administered 2020-04-03: 650 mg via ORAL

## 2020-04-03 MED ORDER — PROCHLORPERAZINE MALEATE 10 MG PO TABS
10.0000 mg | ORAL_TABLET | Freq: Once | ORAL | Status: AC
  Administered 2020-04-03: 10 mg via ORAL

## 2020-04-03 MED ORDER — DIPHENHYDRAMINE HCL 25 MG PO CAPS
ORAL_CAPSULE | ORAL | Status: AC
  Filled 2020-04-03: qty 2

## 2020-04-03 NOTE — Patient Instructions (Signed)
Daratumumab injection What is this medicine? DARATUMUMAB (dar a toom ue mab) is a monoclonal antibody. It is used to treat multiple myeloma. This medicine may be used for other purposes; ask your health care provider or pharmacist if you have questions. COMMON BRAND NAME(S): DARZALEX What should I tell my health care provider before I take this medicine? They need to know if you have any of these conditions:  hereditary fructose intolerance  infection (especially a virus infection such as chickenpox, herpes, or hepatitis B virus)  lung or breathing disease (asthma, COPD)  an unusual or allergic reaction to daratumumab, sorbitol, other medicines, foods, dyes, or preservatives  pregnant or trying to get pregnant  breast-feeding How should I use this medicine? This medicine is for infusion into a vein. It is given by a health care professional in a hospital or clinic setting. Talk to your pediatrician regarding the use of this medicine in children. Special care may be needed. Overdosage: If you think you have taken too much of this medicine contact a poison control center or emergency room at once. NOTE: This medicine is only for you. Do not share this medicine with others. What if I miss a dose? Keep appointments for follow-up doses as directed. It is important not to miss your dose. Call your doctor or health care professional if you are unable to keep an appointment. What may interact with this medicine? Interactions have not been studied. This list may not describe all possible interactions. Give your health care provider a list of all the medicines, herbs, non-prescription drugs, or dietary supplements you use. Also tell them if you smoke, drink alcohol, or use illegal drugs. Some items may interact with your medicine. What should I watch for while using this medicine? Your condition will be monitored carefully while you are receiving this medicine. This medicine can cause serious  allergic reactions. To reduce your risk, your health care provider may give you other medicine to take before receiving this one. Be sure to follow the directions from your health care provider. This medicine can affect the results of blood tests to match your blood type. These changes can last for up to 6 months after the final dose. Your healthcare provider will do blood tests to match your blood type before you start treatment. Tell all of your healthcare providers that you are being treated with this medicine before receiving a blood transfusion. This medicine can affect the results of some tests used to determine treatment response; extra tests may be needed to evaluate response. Do not become pregnant while taking this medicine or for 3 months after stopping it. Women should inform their health care provider if they wish to become pregnant or think they might be pregnant. There is a potential for serious side effects to an unborn child. Talk to your health care provider for more information. Do not breast-feed an infant while taking this medicine. What side effects may I notice from receiving this medicine? Side effects that you should report to your doctor or health care professional as soon as possible:  allergic reactions (skin rash; itching or hives; swelling of the face, lips, or tongue)  infection (fever, chills, cough, sore throat, pain or difficulty passing urine)  infusion reaction (dizziness, fast heartbeat, feeling faint or lightheaded, falls, headache, increase in blood pressure, nausea, vomiting, or wheezing or trouble breathing with loud or whistling sounds)  unusual bleeding or bruising Side effects that usually do not require medical attention (report to your doctor  or health care professional if they continue or are bothersome):  constipation  diarrhea  pain, tingling, numbness in the hands or feet  swelling of the ankles, feet, hands  tiredness This list may not  describe all possible side effects. Call your doctor for medical advice about side effects. You may report side effects to FDA at 1-800-FDA-1088. Where should I keep my medicine? This drug is given in a hospital or clinic and will not be stored at home. NOTE: This sheet is a summary. It may not cover all possible information. If you have questions about this medicine, talk to your doctor, pharmacist, or health care provider.  2021 Elsevier/Gold Standard (2020-01-23 13:28:52)

## 2020-04-03 NOTE — Telephone Encounter (Signed)
Received email from covermymeds that a PA was needed for patient's Oxycodone.  PA submitted and awaiting response.

## 2020-04-03 NOTE — Telephone Encounter (Signed)
Received fax approval good thru 02/13/2021.  Faxed to pharmacy.  Left message for patient to return call to office to explain PA info.

## 2020-04-10 ENCOUNTER — Inpatient Hospital Stay (HOSPITAL_BASED_OUTPATIENT_CLINIC_OR_DEPARTMENT_OTHER): Payer: Medicare Other | Admitting: Hematology & Oncology

## 2020-04-10 ENCOUNTER — Inpatient Hospital Stay: Payer: Medicare Other

## 2020-04-10 ENCOUNTER — Encounter: Payer: Self-pay | Admitting: Hematology & Oncology

## 2020-04-10 ENCOUNTER — Other Ambulatory Visit: Payer: Self-pay

## 2020-04-10 VITALS — BP 137/80 | HR 62 | Temp 97.8°F | Resp 18 | Wt 203.0 lb

## 2020-04-10 DIAGNOSIS — C9002 Multiple myeloma in relapse: Secondary | ICD-10-CM

## 2020-04-10 DIAGNOSIS — Z5112 Encounter for antineoplastic immunotherapy: Secondary | ICD-10-CM | POA: Diagnosis not present

## 2020-04-10 DIAGNOSIS — Z7901 Long term (current) use of anticoagulants: Secondary | ICD-10-CM | POA: Diagnosis not present

## 2020-04-10 DIAGNOSIS — C9000 Multiple myeloma not having achieved remission: Secondary | ICD-10-CM

## 2020-04-10 LAB — CMP (CANCER CENTER ONLY)
ALT: 17 U/L (ref 0–44)
AST: 15 U/L (ref 15–41)
Albumin: 3.8 g/dL (ref 3.5–5.0)
Alkaline Phosphatase: 86 U/L (ref 38–126)
Anion gap: 8 (ref 5–15)
BUN: 19 mg/dL (ref 8–23)
CO2: 28 mmol/L (ref 22–32)
Calcium: 9.5 mg/dL (ref 8.9–10.3)
Chloride: 102 mmol/L (ref 98–111)
Creatinine: 1.12 mg/dL (ref 0.61–1.24)
GFR, Estimated: >60 mL/min (ref >60)
Glucose, Bld: 148 mg/dL — ABNORMAL HIGH (ref 70–99)
Potassium: 3.3 mmol/L — ABNORMAL LOW (ref 3.5–5.1)
Sodium: 138 mmol/L (ref 135–145)
Total Bilirubin: 0.5 mg/dL (ref 0.3–1.2)
Total Protein: 6.1 g/dL — ABNORMAL LOW (ref 6.5–8.1)

## 2020-04-10 LAB — CBC WITH DIFFERENTIAL (CANCER CENTER ONLY)
Abs Immature Granulocytes: 0.01 10*3/uL (ref 0.00–0.07)
Basophils Absolute: 0 10*3/uL (ref 0.0–0.1)
Basophils Relative: 0 %
Eosinophils Absolute: 0 10*3/uL (ref 0.0–0.5)
Eosinophils Relative: 0 %
HCT: 37.5 % — ABNORMAL LOW (ref 39.0–52.0)
Hemoglobin: 12.6 g/dL — ABNORMAL LOW (ref 13.0–17.0)
Immature Granulocytes: 0 %
Lymphocytes Relative: 22 %
Lymphs Abs: 1.1 10*3/uL (ref 0.7–4.0)
MCH: 29.8 pg (ref 26.0–34.0)
MCHC: 33.6 g/dL (ref 30.0–36.0)
MCV: 88.7 fL (ref 80.0–100.0)
Monocytes Absolute: 0.5 10*3/uL (ref 0.1–1.0)
Monocytes Relative: 9 %
Neutro Abs: 3.4 10*3/uL (ref 1.7–7.7)
Neutrophils Relative %: 69 %
Platelet Count: 149 10*3/uL — ABNORMAL LOW (ref 150–400)
RBC: 4.23 MIL/uL (ref 4.22–5.81)
RDW: 13.6 % (ref 11.5–15.5)
WBC Count: 5 10*3/uL (ref 4.0–10.5)
nRBC: 0 % (ref 0.0–0.2)

## 2020-04-10 LAB — SAMPLE TO BLOOD BANK

## 2020-04-10 LAB — LACTATE DEHYDROGENASE: LDH: 100 U/L (ref 98–192)

## 2020-04-10 MED ORDER — ACETAMINOPHEN 325 MG PO TABS
ORAL_TABLET | ORAL | Status: AC
  Filled 2020-04-10: qty 2

## 2020-04-10 MED ORDER — ACETAMINOPHEN 325 MG PO TABS
650.0000 mg | ORAL_TABLET | Freq: Once | ORAL | Status: AC
  Administered 2020-04-10: 650 mg via ORAL

## 2020-04-10 MED ORDER — DIPHENHYDRAMINE HCL 25 MG PO CAPS
ORAL_CAPSULE | ORAL | Status: AC
  Filled 2020-04-10: qty 2

## 2020-04-10 MED ORDER — DEXAMETHASONE 4 MG PO TABS
20.0000 mg | ORAL_TABLET | Freq: Once | ORAL | Status: AC
Start: 2020-04-10 — End: 2020-04-10
  Administered 2020-04-10: 20 mg via ORAL

## 2020-04-10 MED ORDER — PROCHLORPERAZINE MALEATE 10 MG PO TABS
ORAL_TABLET | ORAL | Status: AC
  Filled 2020-04-10: qty 1

## 2020-04-10 MED ORDER — DEXAMETHASONE 4 MG PO TABS
ORAL_TABLET | ORAL | Status: AC
  Filled 2020-04-10: qty 5

## 2020-04-10 MED ORDER — PROCHLORPERAZINE MALEATE 10 MG PO TABS
10.0000 mg | ORAL_TABLET | Freq: Once | ORAL | Status: AC
  Administered 2020-04-10: 10 mg via ORAL

## 2020-04-10 MED ORDER — DIPHENHYDRAMINE HCL 25 MG PO CAPS
50.0000 mg | ORAL_CAPSULE | Freq: Once | ORAL | Status: AC
  Administered 2020-04-10: 50 mg via ORAL

## 2020-04-10 MED ORDER — BORTEZOMIB CHEMO SQ INJECTION 3.5 MG (2.5MG/ML)
1.3000 mg/m2 | Freq: Once | INTRAMUSCULAR | Status: AC
Start: 2020-04-10 — End: 2020-04-10
  Administered 2020-04-10: 2.75 mg via SUBCUTANEOUS
  Filled 2020-04-10: qty 1.1

## 2020-04-10 MED ORDER — DARATUMUMAB-HYALURONIDASE-FIHJ 1800-30000 MG-UT/15ML ~~LOC~~ SOLN
1800.0000 mg | Freq: Once | SUBCUTANEOUS | Status: AC
  Administered 2020-04-10: 1800 mg via SUBCUTANEOUS
  Filled 2020-04-10: qty 15

## 2020-04-10 NOTE — Progress Notes (Signed)
Hematology and Oncology Follow Up Visit  Takuma Cifelli [id number] 06-23-1940 80 y.o. 04/10/2020   Principle Diagnosis:  IgG kappa myeloma - Relapsed -- 1q duplication Acute thromboembolism of the right lower leg  Past Therapy: S/P ASCT at [facility] on 04/02/2015 Patient s/p cycle 5 of Velcade/Revlimid/Decadron Revlimid 10mg  po q day (21/28) -- d/c on 09/15/2017  Current Therapy:     Faspro/Velcade/Decadron -- s/p cycle #1 - start on 03/20/2020    Zometa 4 mg IV every3 months - next dose due 04/2020 Xarelto 10 mg by mouth daily   Interim History:  Mr. Frate is here today for follow-up.  He does feel little bit tired.  I suppose this might be secondary to his treatments.  Is having no more in the back to his comfort.  He does not have any obvious myeloma in the spine.  This is all arthritic issues.  He actually tolerated his first cycle of treatment well.  We have on Faspro along with Velcade.  The Velcade is 2 weeks on and 1 week off for right now.  There is been no problems with fever.  He had little bit of a cough.  He has had this for quite a while.  He really does not cough up much in the way of purulent mucus.  He has had no problems with nausea or vomiting.  He has had no change in bowel or bladder habits.  There is been no leg swelling.  He is on Xarelto.  He wants to change the Xarelto because of the expense.  Our staff will try to work on this for him.  Currently, I would say his performance status is probably ECOG 1.     Medications:  Allergies as of 04/10/2020      Reactions   Budesonide-formoterol Fumarate Itching, Other (See Comments)   Codeine Itching   Hydrocodone-acetaminophen Itching   Mushroom Extract Complex Nausea And Vomiting, Other (See Comments)   Only shitake mushroom  causes this reaction. Flu-like symptoms from portabello mushrooms      Medication List       Accurate as of April 10, 2020  9:45 AM. If you have any questions, ask  your nurse or doctor.        acyclovir 400 MG tablet Commonly known as: ZOVIRAX Take 1 tablet (400 mg total) by mouth 2 (two) times daily.   amLODipine 5 MG tablet Commonly known as: NORVASC Take 1 tablet (5 mg total) by mouth daily.   aspirin EC 81 MG tablet Take by mouth.   benzonatate 200 MG capsule Commonly known as: TESSALON Take 1 capsule (200 mg total) by mouth 3 (three) times daily as needed for cough.   carvedilol 6.25 MG tablet Commonly known as: COREG Take 1 tablet (6.25 mg total) by mouth 2 (two) times daily with a meal.   cetirizine 10 MG tablet Commonly known as: EQ Allergy Relief (Cetirizine) Take 1 tablet (10 mg total) by mouth daily.   Cyanocobalamin 1000 MCG Tbcr Take 1 tablet by mouth daily.   dexamethasone 4 MG tablet Commonly known as: DECADRON Take 5 tablets (20 mg) the day after velcade. Repeat every 21d for 8 cycles.   esomeprazole 40 MG capsule Commonly known as: NEXIUM TAKE 1 CAPSULE DAILY AT 12 NOON   famciclovir 500 MG tablet Commonly known as: FAMVIR Take 1 tablet (500 mg total) by mouth daily.   fluticasone 50 MCG/ACT nasal spray Commonly known as: FLONASE Place 2 sprays into both nostrils daily.  gabapentin 600 MG tablet Commonly known as: NEURONTIN Take 1 tablet (600 mg total) by mouth 3 (three) times daily.   hydrochlorothiazide 12.5 MG capsule Commonly known as: MICROZIDE Take 1 capsule (12.5 mg total) by mouth daily.   isosorbide mononitrate 30 MG 24 hr tablet Commonly known as: IMDUR Take 1 tablet (30 mg total) by mouth 2 (two) times daily.   levothyroxine 100 MCG tablet Commonly known as: Synthroid Take 1 tablet (100 mcg total) by mouth daily before breakfast.   LORazepam 1 MG tablet Commonly known as: ATIVAN Take 1 tablet (1 mg total) by mouth at bedtime.   LORazepam 0.5 MG tablet Commonly known as: Ativan Take 1 tablet (0.5 mg total) by mouth every 6 (six) hours as needed (Nausea or vomiting).   lubiprostone  24 MCG capsule Commonly known as: Amitiza 1 capsule 1-2 times daily for opiate-induced constipation   nitroGLYCERIN 0.4 MG SL tablet Commonly known as: NITROSTAT Place 1 tablet (0.4 mg total) under the tongue every 5 (five) minutes as needed. For chest pain   ondansetron 8 MG tablet Commonly known as: Zofran Take 1 tablet (8 mg total) by mouth 2 (two) times daily as needed (Nausea or vomiting).   oxyCODONE-acetaminophen 10-325 MG tablet Commonly known as: PERCOCET Take 1 tablet by mouth every 8 (eight) hours as needed.   potassium chloride SA 20 MEQ tablet Commonly known as: Klor-Con M20 Take 1 tablet (20 mEq total) by mouth 2 (two) times daily.   prochlorperazine 10 MG tablet Commonly known as: COMPAZINE Take 1 tablet (10 mg total) by mouth every 6 (six) hours as needed (Nausea or vomiting).   pyridoxine 100 MG tablet Commonly known as: B-6 Take 100 mg by mouth daily.   rivaroxaban 10 MG Tabs tablet Commonly known as: XARELTO Take 1 tablet (10 mg total) by mouth daily with supper.   tamsulosin 0.4 MG Caps capsule Commonly known as: FLOMAX Take 1 capsule (0.4 mg total) by mouth daily after supper.       Allergies:  Allergies  Allergen Reactions  . Budesonide-Formoterol Fumarate Itching and Other (See Comments)  . Codeine Itching  . Hydrocodone-Acetaminophen Itching  . Mushroom Extract Complex Nausea And Vomiting and Other (See Comments)    Only shitake mushroom  causes this reaction. Flu-like symptoms from portabello mushrooms    Past Medical History, Surgical history, Social history, and Family History were reviewed and updated.  Review of Systems: Review of Systems  HENT: Negative.   Eyes: Negative.   Respiratory: Negative.   Cardiovascular: Positive for chest pain.  Gastrointestinal: Negative.   Genitourinary: Negative.   Musculoskeletal: Positive for back pain.  Skin: Negative.   Neurological: Negative.   Endo/Heme/Allergies: Negative.    Psychiatric/Behavioral: Negative.      Physical Exam:  vitals were not taken for this visit.   Wt Readings from Last 3 Encounters:  03/12/20 201 lb (91.2 kg)  03/05/20 195 lb (88.5 kg)  01/31/20 195 lb (88.5 kg)    Physical Exam Vitals reviewed.  HENT:     Head: Normocephalic and atraumatic.  Eyes:     Pupils: Pupils are equal, round, and reactive to light.  Cardiovascular:     Rate and Rhythm: Normal rate and regular rhythm.     Heart sounds: Normal heart sounds.  Pulmonary:     Effort: Pulmonary effort is normal.     Breath sounds: Normal breath sounds.  Abdominal:     General: Bowel sounds are normal.     Palpations:  Abdomen is soft.  Musculoskeletal:        General: No tenderness or deformity. Normal range of motion.     Cervical back: Normal range of motion.  Lymphadenopathy:     Cervical: No cervical adenopathy.  Skin:    General: Skin is warm and dry.     Findings: No erythema or rash.  Neurological:     Mental Status: He is alert and oriented to person, place, and time.  Psychiatric:        Behavior: Behavior normal.        Thought Content: Thought content normal.        Judgment: Judgment normal.      Lab Results  Component Value Date   WBC 5.0 04/10/2020   HGB 12.6 (L) 04/10/2020   HCT 37.5 (L) 04/10/2020   MCV 88.7 04/10/2020   PLT 149 (L) 04/10/2020   Lab Results  Component Value Date   FERRITIN 199 07/18/2019   IRON 65 07/18/2019   TIBC 316 07/18/2019   UIBC 251 07/18/2019   IRONPCTSAT 21 07/18/2019   Lab Results  Component Value Date   RETICCTPCT 1.7 07/18/2019   RBC 4.23 04/10/2020   Lab Results  Component Value Date   KPAFRELGTCHN 216.0 (H) 03/12/2020   LAMBDASER 9.4 03/12/2020   KAPLAMBRATIO 22.98 (H) 03/12/2020   Lab Results  Component Value Date   IGGSERUM 950 03/12/2020   IGA 71 03/12/2020   IGMSERUM 43 03/12/2020   Lab Results  Component Value Date   TOTALPROTELP 6.3 03/12/2020   ALBUMINELP 3.5 03/12/2020   A1GS  0.2 03/12/2020   A2GS 0.8 03/12/2020   BETS 1.0 03/12/2020   BETA2SER 0.3 02/11/2015   GAMS 0.8 03/12/2020   MSPIKE Not Observed 03/12/2020   SPEI Comment 10/30/2019     Chemistry      Component Value Date/Time   NA 138 04/10/2020 0849   NA 145 (H) 06/28/2019 1501   NA 140 01/27/2017 1044   NA 141 06/05/2015 1036   K 3.3 (L) 04/10/2020 0849   K 3.3 01/27/2017 1044   K 2.9 (LL) 06/05/2015 1036   CL 102 04/10/2020 0849   CL 101 01/27/2017 1044   CO2 28 04/10/2020 0849   CO2 28 01/27/2017 1044   CO2 27 06/05/2015 1036   BUN 19 04/10/2020 0849   BUN 15 06/28/2019 1501   BUN 14 01/27/2017 1044   BUN 13.1 06/05/2015 1036   CREATININE 1.12 04/10/2020 0849   CREATININE 1.16 10/08/2018 1531   CREATININE 1.0 06/05/2015 1036   GLU 152 03/30/2016 0000      Component Value Date/Time   CALCIUM 9.5 04/10/2020 0849   CALCIUM 8.9 01/27/2017 1044   CALCIUM 9.4 06/05/2015 1036   ALKPHOS 86 04/10/2020 0849   ALKPHOS 126 (H) 01/27/2017 1044   ALKPHOS 94 06/05/2015 1036   AST 15 04/10/2020 0849   AST 22 06/05/2015 1036   ALT 17 04/10/2020 0849   ALT 33 01/27/2017 1044   ALT 18 06/05/2015 1036   BILITOT 0.5 04/10/2020 0849   BILITOT 0.59 06/05/2015 1036       Impression and Plan: Mr. Boughner is a very pleasant 80 yo caucasian gentleman with history of IgG kappa myeloma.  He underwent standard induction chemotherapy and then ultimately stem cell transplant at [facility] in February 2017.   We will see if the Faspro/Velcade/Decadron combination is working.  The Decadron does seem to help him although he says he does stay awake with the Decadron.  He is getting a little bit of weight which he certainly could be from the Decadron.  He has had no problems with the treatment.  We will see what his myeloma levels look like.  I am just happy that overall he is done fairly well.  I have to believe that he will be responding.  If we find that he is not responding as we would like, then we will try  to change the Velcade to 3 weeks on and 1 week off instead of 2 weeks on and 1 week off.  I will plan to see him back in another 3 weeks.   [name], MD 2/25/20229:45 AM

## 2020-04-10 NOTE — Patient Instructions (Signed)
[facility] Discharge Instructions for Patients Receiving Chemotherapy  Today you received the following chemotherapy agents Darzalex and Velcade  To help prevent nausea and vomiting after your treatment, we encourage you to take your nausea medication as prescribed by MD.   If you develop nausea and vomiting that is not controlled by your nausea medication, call the clinic.   BELOW ARE SYMPTOMS THAT SHOULD BE REPORTED IMMEDIATELY:  *FEVER GREATER THAN 100.5 F  *CHILLS WITH OR WITHOUT FEVER  NAUSEA AND VOMITING THAT IS NOT CONTROLLED WITH YOUR NAUSEA MEDICATION  *UNUSUAL SHORTNESS OF BREATH  *UNUSUAL BRUISING OR BLEEDING  TENDERNESS IN MOUTH AND THROAT WITH OR WITHOUT PRESENCE OF ULCERS  *URINARY PROBLEMS  *BOWEL PROBLEMS  UNUSUAL RASH Items with * indicate a potential emergency and should be followed up as soon as possible.  Feel free to call the clinic should you have any questions or concerns. The clinic phone number is [phone].  Please show the [facility] at check-in to the Emergency Department and triage nurse.

## 2020-04-12 LAB — IGG, IGA, IGM
IgA: 17 mg/dL — ABNORMAL LOW (ref 61–437)
IgG (Immunoglobin G), Serum: 725 mg/dL (ref 603–1613)
IgM (Immunoglobulin M), Srm: 26 mg/dL (ref 15–143)

## 2020-04-13 ENCOUNTER — Other Ambulatory Visit: Payer: Self-pay | Admitting: *Deleted

## 2020-04-13 ENCOUNTER — Telehealth: Payer: Self-pay | Admitting: *Deleted

## 2020-04-13 ENCOUNTER — Encounter: Payer: Self-pay | Admitting: *Deleted

## 2020-04-13 DIAGNOSIS — Z9861 Coronary angioplasty status: Secondary | ICD-10-CM

## 2020-04-13 DIAGNOSIS — I2510 Atherosclerotic heart disease of native coronary artery without angina pectoris: Secondary | ICD-10-CM

## 2020-04-13 DIAGNOSIS — I2699 Other pulmonary embolism without acute cor pulmonale: Secondary | ICD-10-CM

## 2020-04-13 LAB — KAPPA/LAMBDA LIGHT CHAINS
Kappa free light chain: 108.4 mg/L — ABNORMAL HIGH (ref 3.3–19.4)
Kappa, lambda light chain ratio: 33.88 — ABNORMAL HIGH (ref 0.26–1.65)
Lambda free light chains: 3.2 mg/L — ABNORMAL LOW (ref 5.7–26.3)

## 2020-04-13 MED ORDER — RIVAROXABAN 10 MG PO TABS: 10.0000 mg | ORAL_TABLET | Freq: Every day | ORAL | 0 refills | Status: DC

## 2020-04-13 NOTE — Telephone Encounter (Signed)
Message left to notify pt per order of Dr. Marin Olp that "the light chain is down by 50% already!!!  [place] job!! [name]"  Instructed pt to call office back with any questions.

## 2020-04-13 NOTE — Telephone Encounter (Signed)
-----   Message from [name], MD sent at 04/13/2020  3:59 PM EST ----- Call - the light chain is down by 50% already!!!  [place] job!!  [name]

## 2020-04-14 ENCOUNTER — Other Ambulatory Visit: Payer: Self-pay | Admitting: *Deleted

## 2020-04-14 ENCOUNTER — Telehealth: Payer: Self-pay | Admitting: *Deleted

## 2020-04-14 DIAGNOSIS — Z9861 Coronary angioplasty status: Secondary | ICD-10-CM

## 2020-04-14 DIAGNOSIS — I2699 Other pulmonary embolism without acute cor pulmonale: Secondary | ICD-10-CM

## 2020-04-14 DIAGNOSIS — I2510 Atherosclerotic heart disease of native coronary artery without angina pectoris: Secondary | ICD-10-CM

## 2020-04-14 LAB — PROTEIN ELECTROPHORESIS, SERUM, WITH REFLEX
A/G Ratio: 1.3 (ref 0.7–1.7)
Albumin ELP: 3.2 g/dL (ref 2.9–4.4)
Alpha-1-Globulin: 0.2 g/dL (ref 0.0–0.4)
Alpha-2-Globulin: 0.8 g/dL (ref 0.4–1.0)
Beta Globulin: 1 g/dL (ref 0.7–1.3)
Gamma Globulin: 0.6 g/dL (ref 0.4–1.8)
Globulin, Total: 2.5 g/dL (ref 2.2–3.9)
M-Spike, %: 0.1 g/dL — ABNORMAL HIGH
SPEP Interpretation: 0
Total Protein ELP: 5.7 g/dL — ABNORMAL LOW (ref 6.0–8.5)

## 2020-04-14 LAB — IMMUNOFIXATION REFLEX, SERUM
IgA: 21 mg/dL — ABNORMAL LOW (ref 61–437)
IgG (Immunoglobin G), Serum: 802 mg/dL (ref 603–1613)
IgM (Immunoglobulin M), Srm: 23 mg/dL (ref 15–143)

## 2020-04-14 MED ORDER — RIVAROXABAN 10 MG PO TABS: 10.0000 mg | ORAL_TABLET | Freq: Every day | ORAL | 3 refills | Status: DC

## 2020-04-14 MED ORDER — RIVAROXABAN 20 MG PO TABS: 20.0000 mg | ORAL_TABLET | Freq: Every day | ORAL | 0 refills | Status: DC

## 2020-04-14 NOTE — Telephone Encounter (Signed)
Call placed to patient to notify him per order of Dr. Marin Olp to take Xarelto 20 mg once daily with no additional Aspirin on those days for 30 days.  After 30 days, go back to Xarelto 10 mg daily along with Asprin 81 mg daily.  Pt appreciative of call back and has no questions at this time.

## 2020-04-15 ENCOUNTER — Telehealth: Payer: Self-pay

## 2020-04-15 NOTE — Telephone Encounter (Signed)
after confirming appts for los appt 04/10/20 pt is aware of all appts and will also view on my chart   [name] [name]

## 2020-04-17 ENCOUNTER — Other Ambulatory Visit: Payer: Self-pay

## 2020-04-17 ENCOUNTER — Inpatient Hospital Stay: Payer: Medicare Other | Attending: Hematology & Oncology

## 2020-04-17 ENCOUNTER — Inpatient Hospital Stay: Payer: Medicare Other

## 2020-04-17 VITALS — BP 122/67 | HR 58 | Temp 98.8°F

## 2020-04-17 DIAGNOSIS — Z7901 Long term (current) use of anticoagulants: Secondary | ICD-10-CM | POA: Insufficient documentation

## 2020-04-17 DIAGNOSIS — Z5112 Encounter for antineoplastic immunotherapy: Secondary | ICD-10-CM | POA: Insufficient documentation

## 2020-04-17 DIAGNOSIS — C9002 Multiple myeloma in relapse: Secondary | ICD-10-CM | POA: Diagnosis not present

## 2020-04-17 DIAGNOSIS — C9000 Multiple myeloma not having achieved remission: Secondary | ICD-10-CM

## 2020-04-17 LAB — CBC WITH DIFFERENTIAL/PLATELET
Abs Immature Granulocytes: 0.02 10*3/uL (ref 0.00–0.07)
Basophils Absolute: 0 10*3/uL (ref 0.0–0.1)
Basophils Relative: 0 %
Eosinophils Absolute: 0 10*3/uL (ref 0.0–0.5)
Eosinophils Relative: 0 %
HCT: 38.4 % — ABNORMAL LOW (ref 39.0–52.0)
Hemoglobin: 12.7 g/dL — ABNORMAL LOW (ref 13.0–17.0)
Immature Granulocytes: 0 %
Lymphocytes Relative: 30 %
Lymphs Abs: 1.8 10*3/uL (ref 0.7–4.0)
MCH: 29.7 pg (ref 26.0–34.0)
MCHC: 33.1 g/dL (ref 30.0–36.0)
MCV: 89.9 fL (ref 80.0–100.0)
Monocytes Absolute: 0.6 10*3/uL (ref 0.1–1.0)
Monocytes Relative: 10 %
Neutro Abs: 3.4 10*3/uL (ref 1.7–7.7)
Neutrophils Relative %: 60 %
Platelets: 128 10*3/uL — ABNORMAL LOW (ref 150–400)
RBC: 4.27 MIL/uL (ref 4.22–5.81)
RDW: 14 % (ref 11.5–15.5)
WBC: 5.8 10*3/uL (ref 4.0–10.5)
nRBC: 0 % (ref 0.0–0.2)

## 2020-04-17 LAB — COMPREHENSIVE METABOLIC PANEL
ALT: 22 U/L (ref 0–44)
AST: 17 U/L (ref 15–41)
Albumin: 4 g/dL (ref 3.5–5.0)
Alkaline Phosphatase: 82 U/L (ref 38–126)
Anion gap: 6 (ref 5–15)
BUN: 12 mg/dL (ref 8–23)
CO2: 34 mmol/L — ABNORMAL HIGH (ref 22–32)
Calcium: 9.2 mg/dL (ref 8.9–10.3)
Chloride: 97 mmol/L — ABNORMAL LOW (ref 98–111)
Creatinine, Ser: 1.01 mg/dL (ref 0.61–1.24)
GFR, Estimated: >60 mL/min (ref >60)
Glucose, Bld: 104 mg/dL — ABNORMAL HIGH (ref 70–99)
Potassium: 3.8 mmol/L (ref 3.5–5.1)
Sodium: 137 mmol/L (ref 135–145)
Total Bilirubin: 0.6 mg/dL (ref 0.3–1.2)
Total Protein: 6.3 g/dL — ABNORMAL LOW (ref 6.5–8.1)

## 2020-04-17 MED ORDER — BORTEZOMIB CHEMO SQ INJECTION 3.5 MG (2.5MG/ML)
1.3000 mg/m2 | Freq: Once | INTRAMUSCULAR | Status: AC
Start: 2020-04-17 — End: 2020-04-17
  Administered 2020-04-17: 2.75 mg via SUBCUTANEOUS
  Filled 2020-04-17: qty 1.1

## 2020-04-17 MED ORDER — DARATUMUMAB-HYALURONIDASE-FIHJ 1800-30000 MG-UT/15ML ~~LOC~~ SOLN
1800.0000 mg | Freq: Once | SUBCUTANEOUS | Status: AC
  Administered 2020-04-17: 1800 mg via SUBCUTANEOUS
  Filled 2020-04-17: qty 15

## 2020-04-17 NOTE — Patient Instructions (Signed)
Daratumumab injection What is this medicine? DARATUMUMAB (dar a toom ue mab) is a monoclonal antibody. It is used to treat multiple myeloma. This medicine may be used for other purposes; ask your health care provider or pharmacist if you have questions. COMMON BRAND NAME(S): DARZALEX What should I tell my health care provider before I take this medicine? They need to know if you have any of these conditions:  hereditary fructose intolerance  infection (especially a virus infection such as chickenpox, herpes, or hepatitis B virus)  lung or breathing disease (asthma, COPD)  an unusual or allergic reaction to daratumumab, sorbitol, other medicines, foods, dyes, or preservatives  pregnant or trying to get pregnant  breast-feeding How should I use this medicine? This medicine is for infusion into a vein. It is given by a health care professional in a hospital or clinic setting. Talk to your pediatrician regarding the use of this medicine in children. Special care may be needed. Overdosage: If you think you have taken too much of this medicine contact a poison control center or emergency room at once. NOTE: This medicine is only for you. Do not share this medicine with others. What if I miss a dose? Keep appointments for follow-up doses as directed. It is important not to miss your dose. Call your doctor or health care professional if you are unable to keep an appointment. What may interact with this medicine? Interactions have not been studied. This list may not describe all possible interactions. Give your health care provider a list of all the medicines, herbs, non-prescription drugs, or dietary supplements you use. Also tell them if you smoke, drink alcohol, or use illegal drugs. Some items may interact with your medicine. What should I watch for while using this medicine? Your condition will be monitored carefully while you are receiving this medicine. This medicine can cause serious  allergic reactions. To reduce your risk, your health care provider may give you other medicine to take before receiving this one. Be sure to follow the directions from your health care provider. This medicine can affect the results of blood tests to match your blood type. These changes can last for up to 6 months after the final dose. Your healthcare provider will do blood tests to match your blood type before you start treatment. Tell all of your healthcare providers that you are being treated with this medicine before receiving a blood transfusion. This medicine can affect the results of some tests used to determine treatment response; extra tests may be needed to evaluate response. Do not become pregnant while taking this medicine or for 3 months after stopping it. Women should inform their health care provider if they wish to become pregnant or think they might be pregnant. There is a potential for serious side effects to an unborn child. Talk to your health care provider for more information. Do not breast-feed an infant while taking this medicine. What side effects may I notice from receiving this medicine? Side effects that you should report to your doctor or health care professional as soon as possible:  allergic reactions (skin rash; itching or hives; swelling of the face, lips, or tongue)  infection (fever, chills, cough, sore throat, pain or difficulty passing urine)  infusion reaction (dizziness, fast heartbeat, feeling faint or lightheaded, falls, headache, increase in blood pressure, nausea, vomiting, or wheezing or trouble breathing with loud or whistling sounds)  unusual bleeding or bruising Side effects that usually do not require medical attention (report to your doctor  or health care professional if they continue or are bothersome):  constipation  diarrhea  pain, tingling, numbness in the hands or feet  swelling of the ankles, feet, hands  tiredness This list may not  describe all possible side effects. Call your doctor for medical advice about side effects. You may report side effects to FDA at 1-800-FDA-1088. Where should I keep my medicine? This drug is given in a hospital or clinic and will not be stored at home. NOTE: This sheet is a summary. It may not cover all possible information. If you have questions about this medicine, talk to your doctor, pharmacist, or health care provider.  2021 Elsevier/Gold Standard (2020-01-23 13:28:52) Bortezomib injection What is this medicine? BORTEZOMIB (bor TEZ oh mib) targets proteins in cancer cells and stops the cancer cells from growing. It treats multiple myeloma and mantle cell lymphoma. This medicine may be used for other purposes; ask your health care provider or pharmacist if you have questions. COMMON BRAND NAME(S): Velcade What should I tell my health care provider before I take this medicine? They need to know if you have any of these conditions:  dehydration  diabetes (high blood sugar)  heart disease  liver disease  tingling of the fingers or toes or other nerve disorder  an unusual or allergic reaction to bortezomib, mannitol, boron, other medicines, foods, dyes, or preservatives  pregnant or trying to get pregnant  breast-feeding How should I use this medicine? This medicine is injected into a vein or under the skin. It is given by a health care provider in a hospital or clinic setting. Talk to your health care provider about the use of this medicine in children. Special care may be needed. Overdosage: If you think you have taken too much of this medicine contact a poison control center or emergency room at once. NOTE: This medicine is only for you. Do not share this medicine with others. What if I miss a dose? Keep appointments for follow-up doses. It is important not to miss your dose. Call your health care provider if you are unable to keep an appointment. What may interact with this  medicine? This medicine may interact with the following medications:  ketoconazole  rifampin This list may not describe all possible interactions. Give your health care provider a list of all the medicines, herbs, non-prescription drugs, or dietary supplements you use. Also tell them if you smoke, drink alcohol, or use illegal drugs. Some items may interact with your medicine. What should I watch for while using this medicine? Your condition will be monitored carefully while you are receiving this medicine. You may need blood work done while you are taking this medicine. You may get drowsy or dizzy. Do not drive, use machinery, or do anything that needs mental alertness until you know how this medicine affects you. Do not stand up or sit up quickly, especially if you are an older patient. This reduces the risk of dizzy or fainting spells This medicine may increase your risk of getting an infection. Call your health care provider for advice if you get a fever, chills, sore throat, or other symptoms of a cold or flu. Do not treat yourself. Try to avoid being around people who are sick. Check with your health care provider if you have severe diarrhea, nausea, and vomiting, or if you sweat a lot. The loss of too much body fluid may make it dangerous for you to take this medicine. Do not become pregnant while taking this   medicine or for 7 months after stopping it. Women should inform their health care provider if they wish to become pregnant or think they might be pregnant. Men should not father a child while taking this medicine and for 4 months after stopping it. There is a potential for serious harm to an unborn child. Talk to your health care provider for more information. Do not breast-feed an infant while taking this medicine or for 2 months after stopping it. This medicine may make it more difficult to get pregnant or father a child. Talk to your health care provider if you are concerned about your  fertility. What side effects may I notice from receiving this medicine? Side effects that you should report to your doctor or health care professional as soon as possible:  allergic reactions (skin rash; itching or hives; swelling of the face, lips, or tongue)  bleeding (bloody or black, tarry stools; red or dark brown urine; spitting up blood or brown material that looks like coffee grounds; red spots on the skin; unusual bruising or bleeding from the eye, gums, or nose)  blurred vision or changes in vision  confusion  constipation  headache  heart failure (trouble breathing; fast, irregular heartbeat; sudden weight gain; swelling of the ankles, feet, hands)  infection (fever, chills, cough, sore throat, pain or trouble passing urine)  lack or loss of appetite  liver injury (dark yellow or brown urine; general ill feeling or flu-like symptoms; loss of appetite, right upper belly pain; yellowing of the eyes or skin)  low blood pressure (dizziness; feeling faint or lightheaded, falls; unusually weak or tired)  muscle cramps  pain, redness, or irritation at site where injected  pain, tingling, numbness in the hands or feet  seizures  trouble breathing  unusual bruising or bleeding Side effects that usually do not require medical attention (report to your doctor or health care professional if they continue or are bothersome):  diarrhea  nausea  stomach pain  trouble sleeping  vomiting This list may not describe all possible side effects. Call your doctor for medical advice about side effects. You may report side effects to FDA at 1-800-FDA-1088. Where should I keep my medicine? This medicine is given in a hospital or clinic. It will not be stored at home. NOTE: This sheet is a summary. It may not cover all possible information. If you have questions about this medicine, talk to your doctor, pharmacist, or health care provider.  2021 Elsevier/Gold Standard (2020-01-23  13:22:53)  

## 2020-04-24 ENCOUNTER — Other Ambulatory Visit: Payer: Self-pay | Admitting: *Deleted

## 2020-04-24 ENCOUNTER — Other Ambulatory Visit: Payer: Self-pay

## 2020-04-24 ENCOUNTER — Inpatient Hospital Stay: Payer: Medicare Other

## 2020-04-24 VITALS — BP 129/71 | HR 57 | Temp 97.9°F

## 2020-04-24 DIAGNOSIS — C9002 Multiple myeloma in relapse: Secondary | ICD-10-CM | POA: Diagnosis not present

## 2020-04-24 DIAGNOSIS — Z5112 Encounter for antineoplastic immunotherapy: Secondary | ICD-10-CM | POA: Diagnosis not present

## 2020-04-24 DIAGNOSIS — C9000 Multiple myeloma not having achieved remission: Secondary | ICD-10-CM

## 2020-04-24 DIAGNOSIS — Z7901 Long term (current) use of anticoagulants: Secondary | ICD-10-CM | POA: Diagnosis not present

## 2020-04-24 LAB — COMPREHENSIVE METABOLIC PANEL
ALT: 20 U/L (ref 0–44)
AST: 16 U/L (ref 15–41)
Albumin: 3.9 g/dL (ref 3.5–5.0)
Alkaline Phosphatase: 89 U/L (ref 38–126)
Anion gap: 5 (ref 5–15)
BUN: 18 mg/dL (ref 8–23)
CO2: 32 mmol/L (ref 22–32)
Calcium: 10 mg/dL (ref 8.9–10.3)
Chloride: 103 mmol/L (ref 98–111)
Creatinine, Ser: 1.11 mg/dL (ref 0.61–1.24)
GFR, Estimated: >60 mL/min (ref >60)
Glucose, Bld: 117 mg/dL — ABNORMAL HIGH (ref 70–99)
Potassium: 4.2 mmol/L (ref 3.5–5.1)
Sodium: 140 mmol/L (ref 135–145)
Total Bilirubin: 0.7 mg/dL (ref 0.3–1.2)
Total Protein: 5.9 g/dL — ABNORMAL LOW (ref 6.5–8.1)

## 2020-04-24 LAB — CBC WITH DIFFERENTIAL/PLATELET
Abs Immature Granulocytes: 0.02 10*3/uL (ref 0.00–0.07)
Basophils Absolute: 0 10*3/uL (ref 0.0–0.1)
Basophils Relative: 0 %
Eosinophils Absolute: 0 10*3/uL (ref 0.0–0.5)
Eosinophils Relative: 0 %
HCT: 39 % (ref 39.0–52.0)
Hemoglobin: 12.5 g/dL — ABNORMAL LOW (ref 13.0–17.0)
Immature Granulocytes: 0 %
Lymphocytes Relative: 23 %
Lymphs Abs: 1.4 10*3/uL (ref 0.7–4.0)
MCH: 29.3 pg (ref 26.0–34.0)
MCHC: 32.1 g/dL (ref 30.0–36.0)
MCV: 91.5 fL (ref 80.0–100.0)
Monocytes Absolute: 0.6 10*3/uL (ref 0.1–1.0)
Monocytes Relative: 10 %
Neutro Abs: 3.9 10*3/uL (ref 1.7–7.7)
Neutrophils Relative %: 67 %
Platelets: 142 10*3/uL — ABNORMAL LOW (ref 150–400)
RBC: 4.26 MIL/uL (ref 4.22–5.81)
RDW: 14.3 % (ref 11.5–15.5)
WBC: 5.9 10*3/uL (ref 4.0–10.5)
nRBC: 0 % (ref 0.0–0.2)

## 2020-04-24 MED ORDER — DARATUMUMAB-HYALURONIDASE-FIHJ 1800-30000 MG-UT/15ML ~~LOC~~ SOLN
1800.0000 mg | Freq: Once | SUBCUTANEOUS | Status: AC
  Administered 2020-04-24: 1800 mg via SUBCUTANEOUS
  Filled 2020-04-24: qty 15

## 2020-04-24 MED ORDER — SODIUM CHLORIDE 0.9 % IV SOLN
Freq: Once | INTRAVENOUS | Status: DC
  Filled 2020-04-24: qty 250

## 2020-04-24 NOTE — Patient Instructions (Signed)
Daratumumab injection What is this medicine? DARATUMUMAB (dar a toom ue mab) is a monoclonal antibody. It is used to treat multiple myeloma. This medicine may be used for other purposes; ask your health care provider or pharmacist if you have questions. COMMON BRAND NAME(S): DARZALEX What should I tell my health care provider before I take this medicine? They need to know if you have any of these conditions:  hereditary fructose intolerance  infection (especially a virus infection such as chickenpox, herpes, or hepatitis B virus)  lung or breathing disease (asthma, COPD)  an unusual or allergic reaction to daratumumab, sorbitol, other medicines, foods, dyes, or preservatives  pregnant or trying to get pregnant  breast-feeding How should I use this medicine? This medicine is for infusion into a vein. It is given by a health care professional in a hospital or clinic setting. Talk to your pediatrician regarding the use of this medicine in children. Special care may be needed. Overdosage: If you think you have taken too much of this medicine contact a poison control center or emergency room at once. NOTE: This medicine is only for you. Do not share this medicine with others. What if I miss a dose? Keep appointments for follow-up doses as directed. It is important not to miss your dose. Call your doctor or health care professional if you are unable to keep an appointment. What may interact with this medicine? Interactions have not been studied. This list may not describe all possible interactions. Give your health care provider a list of all the medicines, herbs, non-prescription drugs, or dietary supplements you use. Also tell them if you smoke, drink alcohol, or use illegal drugs. Some items may interact with your medicine. What should I watch for while using this medicine? Your condition will be monitored carefully while you are receiving this medicine. This medicine can cause serious  allergic reactions. To reduce your risk, your health care provider may give you other medicine to take before receiving this one. Be sure to follow the directions from your health care provider. This medicine can affect the results of blood tests to match your blood type. These changes can last for up to 6 months after the final dose. Your healthcare provider will do blood tests to match your blood type before you start treatment. Tell all of your healthcare providers that you are being treated with this medicine before receiving a blood transfusion. This medicine can affect the results of some tests used to determine treatment response; extra tests may be needed to evaluate response. Do not become pregnant while taking this medicine or for 3 months after stopping it. Women should inform their health care provider if they wish to become pregnant or think they might be pregnant. There is a potential for serious side effects to an unborn child. Talk to your health care provider for more information. Do not breast-feed an infant while taking this medicine. What side effects may I notice from receiving this medicine? Side effects that you should report to your doctor or health care professional as soon as possible:  allergic reactions (skin rash; itching or hives; swelling of the face, lips, or tongue)  infection (fever, chills, cough, sore throat, pain or difficulty passing urine)  infusion reaction (dizziness, fast heartbeat, feeling faint or lightheaded, falls, headache, increase in blood pressure, nausea, vomiting, or wheezing or trouble breathing with loud or whistling sounds)  unusual bleeding or bruising Side effects that usually do not require medical attention (report to your doctor  or health care professional if they continue or are bothersome):  constipation  diarrhea  pain, tingling, numbness in the hands or feet  swelling of the ankles, feet, hands  tiredness This list may not  describe all possible side effects. Call your doctor for medical advice about side effects. You may report side effects to FDA at 1-800-FDA-1088. Where should I keep my medicine? This drug is given in a hospital or clinic and will not be stored at home. NOTE: This sheet is a summary. It may not cover all possible information. If you have questions about this medicine, talk to your doctor, pharmacist, or health care provider.  2021 Elsevier/Gold Standard (2020-01-23 13:28:52)

## 2020-05-06 ENCOUNTER — Other Ambulatory Visit: Payer: Self-pay

## 2020-05-06 ENCOUNTER — Inpatient Hospital Stay: Payer: Medicare Other

## 2020-05-06 ENCOUNTER — Encounter: Payer: Self-pay | Admitting: Hematology & Oncology

## 2020-05-06 ENCOUNTER — Inpatient Hospital Stay (HOSPITAL_BASED_OUTPATIENT_CLINIC_OR_DEPARTMENT_OTHER): Payer: Medicare Other | Admitting: Hematology & Oncology

## 2020-05-06 ENCOUNTER — Telehealth: Payer: Self-pay | Admitting: Hematology & Oncology

## 2020-05-06 VITALS — BP 115/68 | HR 57 | Temp 97.8°F | Resp 18 | Wt 204.0 lb

## 2020-05-06 DIAGNOSIS — C9002 Multiple myeloma in relapse: Secondary | ICD-10-CM

## 2020-05-06 DIAGNOSIS — C9001 Multiple myeloma in remission: Secondary | ICD-10-CM

## 2020-05-06 DIAGNOSIS — Z9861 Coronary angioplasty status: Secondary | ICD-10-CM | POA: Diagnosis not present

## 2020-05-06 DIAGNOSIS — C9000 Multiple myeloma not having achieved remission: Secondary | ICD-10-CM | POA: Diagnosis not present

## 2020-05-06 DIAGNOSIS — I2510 Atherosclerotic heart disease of native coronary artery without angina pectoris: Secondary | ICD-10-CM

## 2020-05-06 DIAGNOSIS — Z5112 Encounter for antineoplastic immunotherapy: Secondary | ICD-10-CM | POA: Diagnosis not present

## 2020-05-06 DIAGNOSIS — Z7901 Long term (current) use of anticoagulants: Secondary | ICD-10-CM | POA: Diagnosis not present

## 2020-05-06 DIAGNOSIS — M899 Disorder of bone, unspecified: Secondary | ICD-10-CM

## 2020-05-06 LAB — CBC WITH DIFFERENTIAL (CANCER CENTER ONLY)
Abs Immature Granulocytes: 0.01 10*3/uL (ref 0.00–0.07)
Basophils Absolute: 0 10*3/uL (ref 0.0–0.1)
Basophils Relative: 1 %
Eosinophils Absolute: 0 10*3/uL (ref 0.0–0.5)
Eosinophils Relative: 0 %
HCT: 36.6 % — ABNORMAL LOW (ref 39.0–52.0)
Hemoglobin: 12.4 g/dL — ABNORMAL LOW (ref 13.0–17.0)
Immature Granulocytes: 0 %
Lymphocytes Relative: 35 %
Lymphs Abs: 1.5 10*3/uL (ref 0.7–4.0)
MCH: 30.2 pg (ref 26.0–34.0)
MCHC: 33.9 g/dL (ref 30.0–36.0)
MCV: 89.1 fL (ref 80.0–100.0)
Monocytes Absolute: 0.6 10*3/uL (ref 0.1–1.0)
Monocytes Relative: 13 %
Neutro Abs: 2.3 10*3/uL (ref 1.7–7.7)
Neutrophils Relative %: 51 %
Platelet Count: 146 10*3/uL — ABNORMAL LOW (ref 150–400)
RBC: 4.11 MIL/uL — ABNORMAL LOW (ref 4.22–5.81)
RDW: 13.9 % (ref 11.5–15.5)
WBC Count: 4.4 10*3/uL (ref 4.0–10.5)
nRBC: 0 % (ref 0.0–0.2)

## 2020-05-06 LAB — CMP (CANCER CENTER ONLY)
ALT: 19 U/L (ref 0–44)
AST: 16 U/L (ref 15–41)
Albumin: 3.9 g/dL (ref 3.5–5.0)
Alkaline Phosphatase: 80 U/L (ref 38–126)
Anion gap: 6 (ref 5–15)
BUN: 14 mg/dL (ref 8–23)
CO2: 32 mmol/L (ref 22–32)
Calcium: 9.3 mg/dL (ref 8.9–10.3)
Chloride: 101 mmol/L (ref 98–111)
Creatinine: 1.06 mg/dL (ref 0.61–1.24)
GFR, Estimated: >60 mL/min (ref >60)
Glucose, Bld: 98 mg/dL (ref 70–99)
Potassium: 3.4 mmol/L — ABNORMAL LOW (ref 3.5–5.1)
Sodium: 139 mmol/L (ref 135–145)
Total Bilirubin: 0.7 mg/dL (ref 0.3–1.2)
Total Protein: 6.2 g/dL — ABNORMAL LOW (ref 6.5–8.1)

## 2020-05-06 LAB — LACTATE DEHYDROGENASE: LDH: 152 U/L (ref 98–192)

## 2020-05-06 MED ORDER — BORTEZOMIB CHEMO SQ INJECTION 3.5 MG (2.5MG/ML)
1.3000 mg/m2 | Freq: Once | INTRAMUSCULAR | Status: AC
Start: 2020-05-06 — End: 2020-05-06
  Administered 2020-05-06: 2.75 mg via SUBCUTANEOUS
  Filled 2020-05-06: qty 1.1

## 2020-05-06 MED ORDER — ZOLEDRONIC ACID 4 MG/100ML IV SOLN
4.0000 mg | Freq: Once | INTRAVENOUS | Status: AC
  Administered 2020-05-06: 4 mg via INTRAVENOUS
  Filled 2020-05-06: qty 100

## 2020-05-06 MED ORDER — DARATUMUMAB-HYALURONIDASE-FIHJ 1800-30000 MG-UT/15ML ~~LOC~~ SOLN
1800.0000 mg | Freq: Once | SUBCUTANEOUS | Status: AC
  Administered 2020-05-06: 1800 mg via SUBCUTANEOUS
  Filled 2020-05-06: qty 15

## 2020-05-06 MED ORDER — KETOROLAC TROMETHAMINE 15 MG/ML IJ SOLN
INTRAMUSCULAR | Status: AC
  Filled 2020-05-06: qty 2

## 2020-05-06 MED ORDER — PROCHLORPERAZINE MALEATE 10 MG PO TABS: 10.0000 mg | ORAL_TABLET | Freq: Once | ORAL | Status: DC

## 2020-05-06 MED ORDER — SODIUM CHLORIDE 0.9 % IV SOLN
Freq: Once | INTRAVENOUS | Status: AC
  Filled 2020-05-06: qty 250

## 2020-05-06 MED ORDER — DIPHENHYDRAMINE HCL 25 MG PO CAPS: 50.0000 mg | ORAL_CAPSULE | Freq: Once | ORAL | Status: DC

## 2020-05-06 MED ORDER — DEXAMETHASONE 4 MG PO TABS: 20.0000 mg | ORAL_TABLET | Freq: Once | ORAL | Status: DC

## 2020-05-06 MED ORDER — ACETAMINOPHEN 325 MG PO TABS: 650.0000 mg | ORAL_TABLET | Freq: Once | ORAL | Status: DC

## 2020-05-06 MED ORDER — KETOROLAC TROMETHAMINE 15 MG/ML IJ SOLN
30.0000 mg | Freq: Once | INTRAMUSCULAR | Status: DC
  Administered 2020-05-06: 30 mg via INTRAVENOUS
  Filled 2020-05-06: qty 2

## 2020-05-06 NOTE — Progress Notes (Signed)
Hematology and Oncology Follow Up Visit  [name] [name] [id number] 11-16-1940 80 y.o. 05/06/2020   Principle Diagnosis:  IgG kappa myeloma - Relapsed -- 1q duplication Acute thromboembolism of the right lower leg  Past Therapy: S/P ASCT at [facility] on 04/02/2015 Patient s/p cycle 5 of Velcade/Revlimid/Decadron Revlimid 10mg  po q day (21/28) -- d/c on 09/15/2017  Current Therapy:     Faspro/Velcade/Decadron -- s/p cycle #2 - start on 03/20/2020    Zometa 4 mg IV every3 months - next dose due 07/2020 Xarelto 10 mg by mouth daily   Interim History:  [name] [name] is here today for follow-up.  His main problem seems to be arthritis.  He is a lot of back issues.  I do not think this is anything from the myeloma since his PET scan back in August was negative.  He may have some exacerbation because of his treatments.  He does get his Zometa today.  I will go ahead and give him a dose of Toradol to see if this might help with some of the inflammatory component.  His light chains are coming down quite nicely.  His Kappa light chain was down to 10.8 mg/dL.  This is down by 50%.  His appetite is doing okay.  He has had no nausea or vomiting.  He has had no change in bowel or bladder habits.  He has had no rashes.  There is been no leg swelling.  He has had no fever.  Overall, performance status is ECOG 1.       Medications:  Allergies as of 05/06/2020      Reactions   Budesonide-formoterol Fumarate Itching, Other (See Comments)   Codeine Itching   Hydrocodone-acetaminophen Itching   Mushroom Extract Complex Nausea And Vomiting, Other (See Comments)   Only shitake mushroom  causes this reaction. Flu-like symptoms from portabello mushrooms      Medication List       Accurate as of May 06, 2020  9:20 AM. If you have any questions, ask your nurse or doctor.        acyclovir 400 MG tablet Commonly known as: ZOVIRAX Take 1 tablet (400 mg total) by mouth 2 (two) times daily.    amLODipine 5 MG tablet Commonly known as: NORVASC Take 1 tablet (5 mg total) by mouth daily.   aspirin EC 81 MG tablet Take by mouth.   benzonatate 200 MG capsule Commonly known as: TESSALON Take 1 capsule (200 mg total) by mouth 3 (three) times daily as needed for cough.   carvedilol 6.25 MG tablet Commonly known as: COREG Take 1 tablet (6.25 mg total) by mouth 2 (two) times daily with a meal.   cetirizine 10 MG tablet Commonly known as: EQ Allergy Relief (Cetirizine) Take 1 tablet (10 mg total) by mouth daily.   Cyanocobalamin 1000 MCG Tbcr Take 1 tablet by mouth daily.   dexamethasone 4 MG tablet Commonly known as: DECADRON Take 5 tablets (20 mg) the day after velcade. Repeat every 21d for 8 cycles.   esomeprazole 40 MG capsule Commonly known as: NEXIUM TAKE 1 CAPSULE DAILY AT 12 NOON   famciclovir 500 MG tablet Commonly known as: FAMVIR Take 1 tablet (500 mg total) by mouth daily.   fluticasone 50 MCG/ACT nasal spray Commonly known as: FLONASE Place 2 sprays into both nostrils daily.   gabapentin 600 MG tablet Commonly known as: NEURONTIN Take 1 tablet (600 mg total) by mouth 3 (three) times daily.   hydrochlorothiazide 12.5 MG capsule  Commonly known as: MICROZIDE Take 1 capsule (12.5 mg total) by mouth daily.   isosorbide mononitrate 30 MG 24 hr tablet Commonly known as: IMDUR Take 1 tablet (30 mg total) by mouth 2 (two) times daily.   levothyroxine 100 MCG tablet Commonly known as: Synthroid Take 1 tablet (100 mcg total) by mouth daily before breakfast.   LORazepam 1 MG tablet Commonly known as: ATIVAN Take 1 tablet (1 mg total) by mouth at bedtime.   LORazepam 0.5 MG tablet Commonly known as: Ativan Take 1 tablet (0.5 mg total) by mouth every 6 (six) hours as needed (Nausea or vomiting).   lubiprostone 24 MCG capsule Commonly known as: Amitiza 1 capsule 1-2 times daily for opiate-induced constipation   nitroGLYCERIN 0.4 MG SL tablet Commonly  known as: NITROSTAT Place 1 tablet (0.4 mg total) under the tongue every 5 (five) minutes as needed. For chest pain   ondansetron 8 MG tablet Commonly known as: Zofran Take 1 tablet (8 mg total) by mouth 2 (two) times daily as needed (Nausea or vomiting).   oxyCODONE-acetaminophen 10-325 MG tablet Commonly known as: PERCOCET Take 1 tablet by mouth every 8 (eight) hours as needed.   potassium chloride SA 20 MEQ tablet Commonly known as: Klor-Con M20 Take 1 tablet (20 mEq total) by mouth 2 (two) times daily.   prochlorperazine 10 MG tablet Commonly known as: COMPAZINE Take 1 tablet (10 mg total) by mouth every 6 (six) hours as needed (Nausea or vomiting).   pyridoxine 100 MG tablet Commonly known as: B-6 Take 100 mg by mouth daily.   rivaroxaban 10 MG Tabs tablet Commonly known as: XARELTO Take 1 tablet (10 mg total) by mouth daily with supper. What changed: Another medication with the same name was removed. Continue taking this medication, and follow the directions you see here. Changed by: [name], MD   tamsulosin 0.4 MG Caps capsule Commonly known as: FLOMAX Take 1 capsule (0.4 mg total) by mouth daily after supper.       Allergies:  Allergies  Allergen Reactions  . Budesonide-Formoterol Fumarate Itching and Other (See Comments)  . Codeine Itching  . Hydrocodone-Acetaminophen Itching  . Mushroom Extract Complex Nausea And Vomiting and Other (See Comments)    Only shitake mushroom  causes this reaction. Flu-like symptoms from portabello mushrooms    Past Medical History, Surgical history, Social history, and Family History were reviewed and updated.  Review of Systems: Review of Systems  HENT: Negative.   Eyes: Negative.   Respiratory: Negative.   Cardiovascular: Positive for chest pain.  Gastrointestinal: Negative.   Genitourinary: Negative.   Musculoskeletal: Positive for back pain.  Skin: Negative.   Neurological: Negative.   Endo/Heme/Allergies:  Negative.   Psychiatric/Behavioral: Negative.      Physical Exam:  weight is 204 lb (92.5 kg). His oral temperature is 97.8 F (36.6 C). His blood pressure is 115/68 and his pulse is 57 (abnormal). His respiration is 18 and oxygen saturation is 100%.   Wt Readings from Last 3 Encounters:  05/06/20 204 lb (92.5 kg)  04/10/20 203 lb (92.1 kg)  03/12/20 201 lb (91.2 kg)    Physical Exam Vitals reviewed.  HENT:     Head: Normocephalic and atraumatic.  Eyes:     Pupils: Pupils are equal, round, and reactive to light.  Cardiovascular:     Rate and Rhythm: Normal rate and regular rhythm.     Heart sounds: Normal heart sounds.  Pulmonary:     Effort: Pulmonary  effort is normal.     Breath sounds: Normal breath sounds.  Abdominal:     General: Bowel sounds are normal.     Palpations: Abdomen is soft.  Musculoskeletal:        General: No tenderness or deformity. Normal range of motion.     Cervical back: Normal range of motion.  Lymphadenopathy:     Cervical: No cervical adenopathy.  Skin:    General: Skin is warm and dry.     Findings: No erythema or rash.  Neurological:     Mental Status: He is alert and oriented to person, place, and time.  Psychiatric:        Behavior: Behavior normal.        Thought Content: Thought content normal.        Judgment: Judgment normal.      Lab Results  Component Value Date   WBC 4.4 05/06/2020   HGB 12.4 (L) 05/06/2020   HCT 36.6 (L) 05/06/2020   MCV 89.1 05/06/2020   PLT 146 (L) 05/06/2020   Lab Results  Component Value Date   FERRITIN 199 07/18/2019   IRON 65 07/18/2019   TIBC 316 07/18/2019   UIBC 251 07/18/2019   IRONPCTSAT 21 07/18/2019   Lab Results  Component Value Date   RETICCTPCT 1.7 07/18/2019   RBC 4.11 (L) 05/06/2020   Lab Results  Component Value Date   KPAFRELGTCHN 108.4 (H) 04/10/2020   LAMBDASER 3.2 (L) 04/10/2020   KAPLAMBRATIO 33.88 (H) 04/10/2020   Lab Results  Component Value Date   IGGSERUM  802 04/10/2020   IGA 21 (L) 04/10/2020   IGMSERUM 23 04/10/2020   Lab Results  Component Value Date   TOTALPROTELP 5.7 (L) 04/10/2020   ALBUMINELP 3.2 04/10/2020   A1GS 0.2 04/10/2020   A2GS 0.8 04/10/2020   BETS 1.0 04/10/2020   BETA2SER 0.3 02/11/2015   GAMS 0.6 04/10/2020   MSPIKE 0.1 (H) 04/10/2020   SPEI Comment 10/30/2019     Chemistry      Component Value Date/Time   NA 139 05/06/2020 0803   NA 145 (H) 06/28/2019 1501   NA 140 01/27/2017 1044   NA 141 06/05/2015 1036   K 3.4 (L) 05/06/2020 0803   K 3.3 01/27/2017 1044   K 2.9 (LL) 06/05/2015 1036   CL 101 05/06/2020 0803   CL 101 01/27/2017 1044   CO2 32 05/06/2020 0803   CO2 28 01/27/2017 1044   CO2 27 06/05/2015 1036   BUN 14 05/06/2020 0803   BUN 15 06/28/2019 1501   BUN 14 01/27/2017 1044   BUN 13.1 06/05/2015 1036   CREATININE 1.06 05/06/2020 0803   CREATININE 1.16 10/08/2018 1531   CREATININE 1.0 06/05/2015 1036   GLU 152 03/30/2016 0000      Component Value Date/Time   CALCIUM 9.3 05/06/2020 0803   CALCIUM 8.9 01/27/2017 1044   CALCIUM 9.4 06/05/2015 1036   ALKPHOS 80 05/06/2020 0803   ALKPHOS 126 (H) 01/27/2017 1044   ALKPHOS 94 06/05/2015 1036   AST 16 05/06/2020 0803   AST 22 06/05/2015 1036   ALT 19 05/06/2020 0803   ALT 33 01/27/2017 1044   ALT 18 06/05/2015 1036   BILITOT 0.7 05/06/2020 0803   BILITOT 0.59 06/05/2015 1036       Impression and Plan: [name] [name] is a very pleasant 80 yo caucasian gentleman with history of IgG kappa myeloma.  He underwent standard induction chemotherapy and then ultimately stem cell transplant at [facility]  in February 2017.   Clearly, treatment is working for him.  I just wish that his arthritis would not be as bad.  I know his family doctor is trying to help with this.  We will go ahead with his third cycle of treatment.  During his fourth cycle of treatment, that is when the [facility] will go to every 2 weeks.  This will make life little bit easier for him.   If you continue to see improvement in his light chains, then we will go ahead and cut back on the Velcade frequency.  We will plan to see him back in 3 weeks.    [name], MD 3/23/20229:20 AM

## 2020-05-06 NOTE — Telephone Encounter (Signed)
Appointments scheduled calendar printed per 3/23 los

## 2020-05-06 NOTE — Patient Instructions (Signed)
[facility] Discharge Instructions for Patients Receiving Chemotherapy  Today you received the following chemotherapy agents Daratumumab, Velcade, Zometa  To help prevent nausea and vomiting after your treatment, we encourage you to take your nausea medication    If you develop nausea and vomiting that is not controlled by your nausea medication, call the clinic.   BELOW ARE SYMPTOMS THAT SHOULD BE REPORTED IMMEDIATELY:  *FEVER GREATER THAN 100.5 F  *CHILLS WITH OR WITHOUT FEVER  NAUSEA AND VOMITING THAT IS NOT CONTROLLED WITH YOUR NAUSEA MEDICATION  *UNUSUAL SHORTNESS OF BREATH  *UNUSUAL BRUISING OR BLEEDING  TENDERNESS IN MOUTH AND THROAT WITH OR WITHOUT PRESENCE OF ULCERS  *URINARY PROBLEMS  *BOWEL PROBLEMS  UNUSUAL RASH Items with * indicate a potential emergency and should be followed up as soon as possible.  Feel free to call the clinic should you have any questions or concerns. The clinic phone number is [phone].  Please show the [facility] at check-in to the Emergency Department and triage nurse.

## 2020-05-07 DIAGNOSIS — L820 Inflamed seborrheic keratosis: Secondary | ICD-10-CM | POA: Diagnosis not present

## 2020-05-07 DIAGNOSIS — D229 Melanocytic nevi, unspecified: Secondary | ICD-10-CM | POA: Diagnosis not present

## 2020-05-07 DIAGNOSIS — D492 Neoplasm of unspecified behavior of bone, soft tissue, and skin: Secondary | ICD-10-CM | POA: Diagnosis not present

## 2020-05-07 DIAGNOSIS — L853 Xerosis cutis: Secondary | ICD-10-CM | POA: Diagnosis not present

## 2020-05-07 DIAGNOSIS — L570 Actinic keratosis: Secondary | ICD-10-CM | POA: Diagnosis not present

## 2020-05-07 DIAGNOSIS — L270 Generalized skin eruption due to drugs and medicaments taken internally: Secondary | ICD-10-CM | POA: Diagnosis not present

## 2020-05-07 LAB — IGG, IGA, IGM
IgA: 15 mg/dL — ABNORMAL LOW (ref 61–437)
IgG (Immunoglobin G), Serum: 642 mg/dL (ref 603–1613)
IgM (Immunoglobulin M), Srm: 18 mg/dL (ref 15–143)

## 2020-05-07 LAB — KAPPA/LAMBDA LIGHT CHAINS
Kappa free light chain: 106.5 mg/L — ABNORMAL HIGH (ref 3.3–19.4)
Kappa, lambda light chain ratio: 32.27 — ABNORMAL HIGH (ref 0.26–1.65)
Lambda free light chains: 3.3 mg/L — ABNORMAL LOW (ref 5.7–26.3)

## 2020-05-08 LAB — PROTEIN ELECTROPHORESIS, SERUM, WITH REFLEX
A/G Ratio: 1.3 (ref 0.7–1.7)
Albumin ELP: 3.4 g/dL (ref 2.9–4.4)
Alpha-1-Globulin: 0.2 g/dL (ref 0.0–0.4)
Alpha-2-Globulin: 0.8 g/dL (ref 0.4–1.0)
Beta Globulin: 1 g/dL (ref 0.7–1.3)
Gamma Globulin: 0.5 g/dL (ref 0.4–1.8)
Globulin, Total: 2.6 g/dL (ref 2.2–3.9)
M-Spike, %: 0.2 g/dL — ABNORMAL HIGH
SPEP Interpretation: 0
Total Protein ELP: 6 g/dL (ref 6.0–8.5)

## 2020-05-08 LAB — IMMUNOFIXATION REFLEX, SERUM
IgA: 16 mg/dL — ABNORMAL LOW (ref 61–437)
IgG (Immunoglobin G), Serum: 657 mg/dL (ref 603–1613)
IgM (Immunoglobulin M), Srm: 20 mg/dL (ref 15–143)

## 2020-05-15 ENCOUNTER — Inpatient Hospital Stay: Payer: Medicare Other

## 2020-05-15 ENCOUNTER — Inpatient Hospital Stay: Payer: Medicare Other | Attending: Hematology & Oncology

## 2020-05-15 ENCOUNTER — Other Ambulatory Visit: Payer: Self-pay

## 2020-05-15 VITALS — BP 107/65 | HR 64 | Temp 97.8°F | Resp 18

## 2020-05-15 DIAGNOSIS — Z5112 Encounter for antineoplastic immunotherapy: Secondary | ICD-10-CM | POA: Diagnosis not present

## 2020-05-15 DIAGNOSIS — C9000 Multiple myeloma not having achieved remission: Secondary | ICD-10-CM

## 2020-05-15 DIAGNOSIS — C9002 Multiple myeloma in relapse: Secondary | ICD-10-CM | POA: Insufficient documentation

## 2020-05-15 LAB — COMPREHENSIVE METABOLIC PANEL
ALT: 24 U/L (ref 0–44)
AST: 29 U/L (ref 15–41)
Albumin: 3.7 g/dL (ref 3.5–5.0)
Alkaline Phosphatase: 72 U/L (ref 38–126)
Anion gap: 6 (ref 5–15)
BUN: 15 mg/dL (ref 8–23)
CO2: 28 mmol/L (ref 22–32)
Calcium: 8.9 mg/dL (ref 8.9–10.3)
Chloride: 103 mmol/L (ref 98–111)
Creatinine, Ser: 1.11 mg/dL (ref 0.61–1.24)
GFR, Estimated: >60 mL/min (ref >60)
Glucose, Bld: 103 mg/dL — ABNORMAL HIGH (ref 70–99)
Potassium: 4.3 mmol/L (ref 3.5–5.1)
Sodium: 137 mmol/L (ref 135–145)
Total Bilirubin: 0.4 mg/dL (ref 0.3–1.2)
Total Protein: 6 g/dL — ABNORMAL LOW (ref 6.5–8.1)

## 2020-05-15 LAB — CBC WITH DIFFERENTIAL/PLATELET
Abs Immature Granulocytes: 0.02 10*3/uL (ref 0.00–0.07)
Basophils Absolute: 0 10*3/uL (ref 0.0–0.1)
Basophils Relative: 0 %
Eosinophils Absolute: 0 10*3/uL (ref 0.0–0.5)
Eosinophils Relative: 0 %
HCT: 35.3 % — ABNORMAL LOW (ref 39.0–52.0)
Hemoglobin: 11.6 g/dL — ABNORMAL LOW (ref 13.0–17.0)
Immature Granulocytes: 0 %
Lymphocytes Relative: 30 %
Lymphs Abs: 1.5 10*3/uL (ref 0.7–4.0)
MCH: 29.7 pg (ref 26.0–34.0)
MCHC: 32.9 g/dL (ref 30.0–36.0)
MCV: 90.3 fL (ref 80.0–100.0)
Monocytes Absolute: 0.6 10*3/uL (ref 0.1–1.0)
Monocytes Relative: 12 %
Neutro Abs: 2.7 10*3/uL (ref 1.7–7.7)
Neutrophils Relative %: 58 %
Platelets: 124 10*3/uL — ABNORMAL LOW (ref 150–400)
RBC: 3.91 MIL/uL — ABNORMAL LOW (ref 4.22–5.81)
RDW: 14.4 % (ref 11.5–15.5)
WBC: 4.8 10*3/uL (ref 4.0–10.5)
nRBC: 0 % (ref 0.0–0.2)

## 2020-05-15 MED ORDER — BORTEZOMIB CHEMO SQ INJECTION 3.5 MG (2.5MG/ML)
1.3000 mg/m2 | Freq: Once | INTRAMUSCULAR | Status: AC
  Administered 2020-05-15: 2.75 mg via SUBCUTANEOUS
  Filled 2020-05-15: qty 1.1

## 2020-05-15 MED ORDER — DARATUMUMAB-HYALURONIDASE-FIHJ 1800-30000 MG-UT/15ML ~~LOC~~ SOLN
1800.0000 mg | Freq: Once | SUBCUTANEOUS | Status: AC
  Administered 2020-05-15: 1800 mg via SUBCUTANEOUS
  Filled 2020-05-15: qty 15

## 2020-05-15 MED ORDER — DEXAMETHASONE 4 MG PO TABS: ORAL_TABLET | ORAL | 7 refills | Status: DC

## 2020-05-15 MED ORDER — DEXAMETHASONE 4 MG PO TABS: 20.0000 mg | ORAL_TABLET | Freq: Once | ORAL | Status: DC

## 2020-05-15 MED ORDER — PROCHLORPERAZINE MALEATE 10 MG PO TABS: 10.0000 mg | ORAL_TABLET | Freq: Once | ORAL | Status: DC

## 2020-05-15 MED ORDER — ACETAMINOPHEN 325 MG PO TABS: 650.0000 mg | ORAL_TABLET | Freq: Once | ORAL | Status: DC

## 2020-05-15 NOTE — Patient Instructions (Signed)
Daratumumab injection What is this medicine? DARATUMUMAB (dar a toom ue mab) is a monoclonal antibody. It is used to treat multiple myeloma. This medicine may be used for other purposes; ask your health care provider or pharmacist if you have questions. COMMON BRAND NAME(S): DARZALEX What should I tell my health care provider before I take this medicine? They need to know if you have any of these conditions:  hereditary fructose intolerance  infection (especially a virus infection such as chickenpox, herpes, or hepatitis B virus)  lung or breathing disease (asthma, COPD)  an unusual or allergic reaction to daratumumab, sorbitol, other medicines, foods, dyes, or preservatives  pregnant or trying to get pregnant  breast-feeding How should I use this medicine? This medicine is for infusion into a vein. It is given by a health care professional in a hospital or clinic setting. Talk to your pediatrician regarding the use of this medicine in children. Special care may be needed. Overdosage: If you think you have taken too much of this medicine contact a poison control center or emergency room at once. NOTE: This medicine is only for you. Do not share this medicine with others. What if I miss a dose? Keep appointments for follow-up doses as directed. It is important not to miss your dose. Call your doctor or health care professional if you are unable to keep an appointment. What may interact with this medicine? Interactions have not been studied. This list may not describe all possible interactions. Give your health care provider a list of all the medicines, herbs, non-prescription drugs, or dietary supplements you use. Also tell them if you smoke, drink alcohol, or use illegal drugs. Some items may interact with your medicine. What should I watch for while using this medicine? Your condition will be monitored carefully while you are receiving this medicine. This medicine can cause serious  allergic reactions. To reduce your risk, your health care provider may give you other medicine to take before receiving this one. Be sure to follow the directions from your health care provider. This medicine can affect the results of blood tests to match your blood type. These changes can last for up to 6 months after the final dose. Your healthcare provider will do blood tests to match your blood type before you start treatment. Tell all of your healthcare providers that you are being treated with this medicine before receiving a blood transfusion. This medicine can affect the results of some tests used to determine treatment response; extra tests may be needed to evaluate response. Do not become pregnant while taking this medicine or for 3 months after stopping it. Women should inform their health care provider if they wish to become pregnant or think they might be pregnant. There is a potential for serious side effects to an unborn child. Talk to your health care provider for more information. Do not breast-feed an infant while taking this medicine. What side effects may I notice from receiving this medicine? Side effects that you should report to your doctor or health care professional as soon as possible:  allergic reactions (skin rash; itching or hives; swelling of the face, lips, or tongue)  infection (fever, chills, cough, sore throat, pain or difficulty passing urine)  infusion reaction (dizziness, fast heartbeat, feeling faint or lightheaded, falls, headache, increase in blood pressure, nausea, vomiting, or wheezing or trouble breathing with loud or whistling sounds)  unusual bleeding or bruising Side effects that usually do not require medical attention (report to your doctor  or health care professional if they continue or are bothersome):  constipation  diarrhea  pain, tingling, numbness in the hands or feet  swelling of the ankles, feet, hands  tiredness This list may not  describe all possible side effects. Call your doctor for medical advice about side effects. You may report side effects to FDA at 1-800-FDA-1088. Where should I keep my medicine? This drug is given in a hospital or clinic and will not be stored at home. NOTE: This sheet is a summary. It may not cover all possible information. If you have questions about this medicine, talk to your doctor, pharmacist, or health care provider.  2021 Elsevier/Gold Standard (2020-01-23 13:28:52) Bortezomib injection What is this medicine? BORTEZOMIB (bor TEZ oh mib) targets proteins in cancer cells and stops the cancer cells from growing. It treats multiple myeloma and mantle cell lymphoma. This medicine may be used for other purposes; ask your health care provider or pharmacist if you have questions. COMMON BRAND NAME(S): Velcade What should I tell my health care provider before I take this medicine? They need to know if you have any of these conditions:  dehydration  diabetes (high blood sugar)  heart disease  liver disease  tingling of the fingers or toes or other nerve disorder  an unusual or allergic reaction to bortezomib, mannitol, boron, other medicines, foods, dyes, or preservatives  pregnant or trying to get pregnant  breast-feeding How should I use this medicine? This medicine is injected into a vein or under the skin. It is given by a health care provider in a hospital or clinic setting. Talk to your health care provider about the use of this medicine in children. Special care may be needed. Overdosage: If you think you have taken too much of this medicine contact a poison control center or emergency room at once. NOTE: This medicine is only for you. Do not share this medicine with others. What if I miss a dose? Keep appointments for follow-up doses. It is important not to miss your dose. Call your health care provider if you are unable to keep an appointment. What may interact with this  medicine? This medicine may interact with the following medications:  ketoconazole  rifampin This list may not describe all possible interactions. Give your health care provider a list of all the medicines, herbs, non-prescription drugs, or dietary supplements you use. Also tell them if you smoke, drink alcohol, or use illegal drugs. Some items may interact with your medicine. What should I watch for while using this medicine? Your condition will be monitored carefully while you are receiving this medicine. You may need blood work done while you are taking this medicine. You may get drowsy or dizzy. Do not drive, use machinery, or do anything that needs mental alertness until you know how this medicine affects you. Do not stand up or sit up quickly, especially if you are an older patient. This reduces the risk of dizzy or fainting spells This medicine may increase your risk of getting an infection. Call your health care provider for advice if you get a fever, chills, sore throat, or other symptoms of a cold or flu. Do not treat yourself. Try to avoid being around people who are sick. Check with your health care provider if you have severe diarrhea, nausea, and vomiting, or if you sweat a lot. The loss of too much body fluid may make it dangerous for you to take this medicine. Do not become pregnant while taking this   medicine or for 7 months after stopping it. Women should inform their health care provider if they wish to become pregnant or think they might be pregnant. Men should not father a child while taking this medicine and for 4 months after stopping it. There is a potential for serious harm to an unborn child. Talk to your health care provider for more information. Do not breast-feed an infant while taking this medicine or for 2 months after stopping it. This medicine may make it more difficult to get pregnant or father a child. Talk to your health care provider if you are concerned about your  fertility. What side effects may I notice from receiving this medicine? Side effects that you should report to your doctor or health care professional as soon as possible:  allergic reactions (skin rash; itching or hives; swelling of the face, lips, or tongue)  bleeding (bloody or black, tarry stools; red or dark brown urine; spitting up blood or brown material that looks like coffee grounds; red spots on the skin; unusual bruising or bleeding from the eye, gums, or nose)  blurred vision or changes in vision  confusion  constipation  headache  heart failure (trouble breathing; fast, irregular heartbeat; sudden weight gain; swelling of the ankles, feet, hands)  infection (fever, chills, cough, sore throat, pain or trouble passing urine)  lack or loss of appetite  liver injury (dark yellow or brown urine; general ill feeling or flu-like symptoms; loss of appetite, right upper belly pain; yellowing of the eyes or skin)  low blood pressure (dizziness; feeling faint or lightheaded, falls; unusually weak or tired)  muscle cramps  pain, redness, or irritation at site where injected  pain, tingling, numbness in the hands or feet  seizures  trouble breathing  unusual bruising or bleeding Side effects that usually do not require medical attention (report to your doctor or health care professional if they continue or are bothersome):  diarrhea  nausea  stomach pain  trouble sleeping  vomiting This list may not describe all possible side effects. Call your doctor for medical advice about side effects. You may report side effects to FDA at 1-800-FDA-1088. Where should I keep my medicine? This medicine is given in a hospital or clinic. It will not be stored at home. NOTE: This sheet is a summary. It may not cover all possible information. If you have questions about this medicine, talk to your doctor, pharmacist, or health care provider.  2021 Elsevier/Gold Standard (2020-01-23  13:22:53)  

## 2020-05-17 DIAGNOSIS — Z955 Presence of coronary angioplasty implant and graft: Secondary | ICD-10-CM | POA: Diagnosis not present

## 2020-05-17 DIAGNOSIS — J156 Pneumonia due to other aerobic Gram-negative bacteria: Secondary | ICD-10-CM | POA: Diagnosis not present

## 2020-05-17 DIAGNOSIS — I214 Non-ST elevation (NSTEMI) myocardial infarction: Secondary | ICD-10-CM | POA: Diagnosis not present

## 2020-05-17 DIAGNOSIS — J189 Pneumonia, unspecified organism: Secondary | ICD-10-CM | POA: Diagnosis not present

## 2020-05-17 DIAGNOSIS — C9000 Multiple myeloma not having achieved remission: Secondary | ICD-10-CM | POA: Diagnosis not present

## 2020-05-17 DIAGNOSIS — I10 Essential (primary) hypertension: Secondary | ICD-10-CM | POA: Diagnosis not present

## 2020-05-17 DIAGNOSIS — G4733 Obstructive sleep apnea (adult) (pediatric): Secondary | ICD-10-CM | POA: Diagnosis not present

## 2020-05-17 DIAGNOSIS — E119 Type 2 diabetes mellitus without complications: Secondary | ICD-10-CM | POA: Diagnosis not present

## 2020-05-17 DIAGNOSIS — Z20822 Contact with and (suspected) exposure to covid-19: Secondary | ICD-10-CM | POA: Diagnosis not present

## 2020-05-17 DIAGNOSIS — U071 COVID-19: Secondary | ICD-10-CM | POA: Diagnosis not present

## 2020-05-17 DIAGNOSIS — K219 Gastro-esophageal reflux disease without esophagitis: Secondary | ICD-10-CM | POA: Diagnosis not present

## 2020-05-17 DIAGNOSIS — D696 Thrombocytopenia, unspecified: Secondary | ICD-10-CM | POA: Diagnosis not present

## 2020-05-17 DIAGNOSIS — R918 Other nonspecific abnormal finding of lung field: Secondary | ICD-10-CM | POA: Diagnosis not present

## 2020-05-17 DIAGNOSIS — I2510 Atherosclerotic heart disease of native coronary artery without angina pectoris: Secondary | ICD-10-CM | POA: Diagnosis not present

## 2020-05-17 DIAGNOSIS — J439 Emphysema, unspecified: Secondary | ICD-10-CM | POA: Diagnosis not present

## 2020-05-17 DIAGNOSIS — R0602 Shortness of breath: Secondary | ICD-10-CM | POA: Diagnosis not present

## 2020-05-17 DIAGNOSIS — Z9989 Dependence on other enabling machines and devices: Secondary | ICD-10-CM | POA: Diagnosis not present

## 2020-05-17 DIAGNOSIS — J9601 Acute respiratory failure with hypoxia: Secondary | ICD-10-CM | POA: Diagnosis not present

## 2020-05-17 DIAGNOSIS — Z9484 Stem cells transplant status: Secondary | ICD-10-CM | POA: Diagnosis not present

## 2020-05-17 DIAGNOSIS — I517 Cardiomegaly: Secondary | ICD-10-CM | POA: Diagnosis not present

## 2020-05-17 DIAGNOSIS — R079 Chest pain, unspecified: Secondary | ICD-10-CM | POA: Diagnosis not present

## 2020-05-18 ENCOUNTER — Ambulatory Visit: Payer: Medicare Other | Admitting: Sports Medicine

## 2020-05-18 ENCOUNTER — Telehealth: Payer: Self-pay | Admitting: *Deleted

## 2020-05-18 DIAGNOSIS — Z66 Do not resuscitate: Secondary | ICD-10-CM | POA: Diagnosis present

## 2020-05-18 DIAGNOSIS — J9601 Acute respiratory failure with hypoxia: Secondary | ICD-10-CM | POA: Diagnosis not present

## 2020-05-18 DIAGNOSIS — I952 Hypotension due to drugs: Secondary | ICD-10-CM | POA: Diagnosis not present

## 2020-05-18 DIAGNOSIS — I10 Essential (primary) hypertension: Secondary | ICD-10-CM | POA: Diagnosis not present

## 2020-05-18 DIAGNOSIS — N289 Disorder of kidney and ureter, unspecified: Secondary | ICD-10-CM | POA: Diagnosis present

## 2020-05-18 DIAGNOSIS — K219 Gastro-esophageal reflux disease without esophagitis: Secondary | ICD-10-CM | POA: Diagnosis not present

## 2020-05-18 DIAGNOSIS — I2510 Atherosclerotic heart disease of native coronary artery without angina pectoris: Secondary | ICD-10-CM | POA: Diagnosis not present

## 2020-05-18 DIAGNOSIS — Z9911 Dependence on respirator [ventilator] status: Secondary | ICD-10-CM | POA: Diagnosis not present

## 2020-05-18 DIAGNOSIS — Z9484 Stem cells transplant status: Secondary | ICD-10-CM | POA: Diagnosis not present

## 2020-05-18 DIAGNOSIS — E876 Hypokalemia: Secondary | ICD-10-CM | POA: Diagnosis present

## 2020-05-18 DIAGNOSIS — D696 Thrombocytopenia, unspecified: Secondary | ICD-10-CM | POA: Diagnosis not present

## 2020-05-18 DIAGNOSIS — I5189 Other ill-defined heart diseases: Secondary | ICD-10-CM | POA: Diagnosis not present

## 2020-05-18 DIAGNOSIS — I509 Heart failure, unspecified: Secondary | ICD-10-CM | POA: Diagnosis not present

## 2020-05-18 DIAGNOSIS — E039 Hypothyroidism, unspecified: Secondary | ICD-10-CM | POA: Diagnosis present

## 2020-05-18 DIAGNOSIS — N400 Enlarged prostate without lower urinary tract symptoms: Secondary | ICD-10-CM | POA: Diagnosis present

## 2020-05-18 DIAGNOSIS — R0602 Shortness of breath: Secondary | ICD-10-CM | POA: Diagnosis not present

## 2020-05-18 DIAGNOSIS — Z20822 Contact with and (suspected) exposure to covid-19: Secondary | ICD-10-CM | POA: Diagnosis present

## 2020-05-18 DIAGNOSIS — D630 Anemia in neoplastic disease: Secondary | ICD-10-CM | POA: Diagnosis present

## 2020-05-18 DIAGNOSIS — J189 Pneumonia, unspecified organism: Secondary | ICD-10-CM | POA: Diagnosis not present

## 2020-05-18 DIAGNOSIS — I255 Ischemic cardiomyopathy: Secondary | ICD-10-CM | POA: Diagnosis not present

## 2020-05-18 DIAGNOSIS — C9000 Multiple myeloma not having achieved remission: Secondary | ICD-10-CM | POA: Diagnosis not present

## 2020-05-18 DIAGNOSIS — I214 Non-ST elevation (NSTEMI) myocardial infarction: Secondary | ICD-10-CM | POA: Diagnosis not present

## 2020-05-18 DIAGNOSIS — R778 Other specified abnormalities of plasma proteins: Secondary | ICD-10-CM | POA: Diagnosis not present

## 2020-05-18 DIAGNOSIS — I2582 Chronic total occlusion of coronary artery: Secondary | ICD-10-CM | POA: Diagnosis not present

## 2020-05-18 DIAGNOSIS — R079 Chest pain, unspecified: Secondary | ICD-10-CM | POA: Diagnosis not present

## 2020-05-18 DIAGNOSIS — Z955 Presence of coronary angioplasty implant and graft: Secondary | ICD-10-CM | POA: Diagnosis not present

## 2020-05-18 DIAGNOSIS — J156 Pneumonia due to other aerobic Gram-negative bacteria: Secondary | ICD-10-CM | POA: Diagnosis not present

## 2020-05-18 DIAGNOSIS — I519 Heart disease, unspecified: Secondary | ICD-10-CM | POA: Diagnosis not present

## 2020-05-18 DIAGNOSIS — J440 Chronic obstructive pulmonary disease with acute lower respiratory infection: Secondary | ICD-10-CM | POA: Diagnosis not present

## 2020-05-18 DIAGNOSIS — E785 Hyperlipidemia, unspecified: Secondary | ICD-10-CM | POA: Diagnosis not present

## 2020-05-18 DIAGNOSIS — I517 Cardiomegaly: Secondary | ICD-10-CM | POA: Diagnosis not present

## 2020-05-18 DIAGNOSIS — E119 Type 2 diabetes mellitus without complications: Secondary | ICD-10-CM | POA: Diagnosis not present

## 2020-05-18 DIAGNOSIS — I447 Left bundle-branch block, unspecified: Secondary | ICD-10-CM | POA: Diagnosis present

## 2020-05-18 DIAGNOSIS — J449 Chronic obstructive pulmonary disease, unspecified: Secondary | ICD-10-CM | POA: Diagnosis not present

## 2020-05-18 DIAGNOSIS — I259 Chronic ischemic heart disease, unspecified: Secondary | ICD-10-CM | POA: Diagnosis present

## 2020-05-18 DIAGNOSIS — G4733 Obstructive sleep apnea (adult) (pediatric): Secondary | ICD-10-CM | POA: Diagnosis not present

## 2020-05-18 DIAGNOSIS — I2583 Coronary atherosclerosis due to lipid rich plaque: Secondary | ICD-10-CM | POA: Diagnosis not present

## 2020-05-18 DIAGNOSIS — J439 Emphysema, unspecified: Secondary | ICD-10-CM | POA: Diagnosis present

## 2020-05-18 NOTE — Telephone Encounter (Signed)
Patient called to let us know that he is in the hospital [facility] with double pneumonia and some "heart issues".  Needs to cancel next few weeks of chemotherapy per the attending physician there.  Patient anticipates he will be in the hospital for another week.

## 2020-05-20 ENCOUNTER — Other Ambulatory Visit: Payer: Medicare Other

## 2020-05-20 ENCOUNTER — Ambulatory Visit: Payer: Medicare Other

## 2020-05-26 DIAGNOSIS — I25119 Atherosclerotic heart disease of native coronary artery with unspecified angina pectoris: Secondary | ICD-10-CM | POA: Diagnosis not present

## 2020-05-26 DIAGNOSIS — I214 Non-ST elevation (NSTEMI) myocardial infarction: Secondary | ICD-10-CM | POA: Diagnosis not present

## 2020-05-26 DIAGNOSIS — D696 Thrombocytopenia, unspecified: Secondary | ICD-10-CM | POA: Diagnosis not present

## 2020-05-26 DIAGNOSIS — K219 Gastro-esophageal reflux disease without esophagitis: Secondary | ICD-10-CM | POA: Diagnosis not present

## 2020-05-26 DIAGNOSIS — K449 Diaphragmatic hernia without obstruction or gangrene: Secondary | ICD-10-CM | POA: Diagnosis not present

## 2020-05-26 DIAGNOSIS — K649 Unspecified hemorrhoids: Secondary | ICD-10-CM | POA: Diagnosis not present

## 2020-05-26 DIAGNOSIS — Z7902 Long term (current) use of antithrombotics/antiplatelets: Secondary | ICD-10-CM | POA: Diagnosis not present

## 2020-05-26 DIAGNOSIS — E039 Hypothyroidism, unspecified: Secondary | ICD-10-CM | POA: Diagnosis not present

## 2020-05-26 DIAGNOSIS — I255 Ischemic cardiomyopathy: Secondary | ICD-10-CM | POA: Diagnosis not present

## 2020-05-26 DIAGNOSIS — Z79899 Other long term (current) drug therapy: Secondary | ICD-10-CM | POA: Diagnosis not present

## 2020-05-26 DIAGNOSIS — D649 Anemia, unspecified: Secondary | ICD-10-CM | POA: Diagnosis not present

## 2020-05-26 DIAGNOSIS — Z7901 Long term (current) use of anticoagulants: Secondary | ICD-10-CM | POA: Diagnosis not present

## 2020-05-26 DIAGNOSIS — Z86711 Personal history of pulmonary embolism: Secondary | ICD-10-CM | POA: Diagnosis not present

## 2020-05-26 DIAGNOSIS — C9000 Multiple myeloma not having achieved remission: Secondary | ICD-10-CM | POA: Diagnosis not present

## 2020-05-26 DIAGNOSIS — E785 Hyperlipidemia, unspecified: Secondary | ICD-10-CM | POA: Diagnosis not present

## 2020-05-26 DIAGNOSIS — I447 Left bundle-branch block, unspecified: Secondary | ICD-10-CM | POA: Diagnosis not present

## 2020-05-26 DIAGNOSIS — Z9181 History of falling: Secondary | ICD-10-CM | POA: Diagnosis not present

## 2020-05-26 DIAGNOSIS — J439 Emphysema, unspecified: Secondary | ICD-10-CM | POA: Diagnosis not present

## 2020-05-26 DIAGNOSIS — I10 Essential (primary) hypertension: Secondary | ICD-10-CM | POA: Diagnosis not present

## 2020-05-26 DIAGNOSIS — G4733 Obstructive sleep apnea (adult) (pediatric): Secondary | ICD-10-CM | POA: Diagnosis not present

## 2020-05-26 DIAGNOSIS — E119 Type 2 diabetes mellitus without complications: Secondary | ICD-10-CM | POA: Diagnosis not present

## 2020-05-26 DIAGNOSIS — Z79891 Long term (current) use of opiate analgesic: Secondary | ICD-10-CM | POA: Diagnosis not present

## 2020-05-26 DIAGNOSIS — N400 Enlarged prostate without lower urinary tract symptoms: Secondary | ICD-10-CM | POA: Diagnosis not present

## 2020-05-26 DIAGNOSIS — Z8701 Personal history of pneumonia (recurrent): Secondary | ICD-10-CM | POA: Diagnosis not present

## 2020-05-26 DIAGNOSIS — Z7952 Long term (current) use of systemic steroids: Secondary | ICD-10-CM | POA: Diagnosis not present

## 2020-05-27 ENCOUNTER — Other Ambulatory Visit: Payer: Medicare Other

## 2020-05-27 ENCOUNTER — Ambulatory Visit: Payer: Medicare Other | Admitting: Hematology & Oncology

## 2020-05-27 ENCOUNTER — Ambulatory Visit: Payer: Medicare Other

## 2020-05-29 DIAGNOSIS — I214 Non-ST elevation (NSTEMI) myocardial infarction: Secondary | ICD-10-CM | POA: Diagnosis not present

## 2020-05-29 DIAGNOSIS — I255 Ischemic cardiomyopathy: Secondary | ICD-10-CM | POA: Diagnosis not present

## 2020-05-29 DIAGNOSIS — E119 Type 2 diabetes mellitus without complications: Secondary | ICD-10-CM | POA: Diagnosis not present

## 2020-05-29 DIAGNOSIS — I10 Essential (primary) hypertension: Secondary | ICD-10-CM | POA: Diagnosis not present

## 2020-05-29 DIAGNOSIS — I25119 Atherosclerotic heart disease of native coronary artery with unspecified angina pectoris: Secondary | ICD-10-CM | POA: Diagnosis not present

## 2020-05-29 DIAGNOSIS — J439 Emphysema, unspecified: Secondary | ICD-10-CM | POA: Diagnosis not present

## 2020-06-01 DIAGNOSIS — I214 Non-ST elevation (NSTEMI) myocardial infarction: Secondary | ICD-10-CM | POA: Diagnosis not present

## 2020-06-01 DIAGNOSIS — E119 Type 2 diabetes mellitus without complications: Secondary | ICD-10-CM | POA: Diagnosis not present

## 2020-06-01 DIAGNOSIS — I255 Ischemic cardiomyopathy: Secondary | ICD-10-CM | POA: Diagnosis not present

## 2020-06-01 DIAGNOSIS — J439 Emphysema, unspecified: Secondary | ICD-10-CM | POA: Diagnosis not present

## 2020-06-01 DIAGNOSIS — I25119 Atherosclerotic heart disease of native coronary artery with unspecified angina pectoris: Secondary | ICD-10-CM | POA: Diagnosis not present

## 2020-06-01 DIAGNOSIS — I10 Essential (primary) hypertension: Secondary | ICD-10-CM | POA: Diagnosis not present

## 2020-06-03 ENCOUNTER — Ambulatory Visit: Payer: Medicare Other

## 2020-06-03 ENCOUNTER — Other Ambulatory Visit: Payer: Medicare Other

## 2020-06-04 DIAGNOSIS — I10 Essential (primary) hypertension: Secondary | ICD-10-CM | POA: Diagnosis not present

## 2020-06-04 DIAGNOSIS — J439 Emphysema, unspecified: Secondary | ICD-10-CM | POA: Diagnosis not present

## 2020-06-04 DIAGNOSIS — I255 Ischemic cardiomyopathy: Secondary | ICD-10-CM | POA: Diagnosis not present

## 2020-06-04 DIAGNOSIS — E119 Type 2 diabetes mellitus without complications: Secondary | ICD-10-CM | POA: Diagnosis not present

## 2020-06-04 DIAGNOSIS — I25119 Atherosclerotic heart disease of native coronary artery with unspecified angina pectoris: Secondary | ICD-10-CM | POA: Diagnosis not present

## 2020-06-04 DIAGNOSIS — I214 Non-ST elevation (NSTEMI) myocardial infarction: Secondary | ICD-10-CM | POA: Diagnosis not present

## 2020-06-05 ENCOUNTER — Inpatient Hospital Stay (HOSPITAL_BASED_OUTPATIENT_CLINIC_OR_DEPARTMENT_OTHER): Payer: Medicare Other | Admitting: Hematology & Oncology

## 2020-06-05 ENCOUNTER — Inpatient Hospital Stay: Payer: Medicare Other

## 2020-06-05 ENCOUNTER — Other Ambulatory Visit: Payer: Medicare Other

## 2020-06-05 ENCOUNTER — Other Ambulatory Visit: Payer: Self-pay

## 2020-06-05 ENCOUNTER — Encounter: Payer: Self-pay | Admitting: Hematology & Oncology

## 2020-06-05 ENCOUNTER — Telehealth: Payer: Self-pay | Admitting: *Deleted

## 2020-06-05 VITALS — BP 107/63 | HR 63 | Temp 97.7°F | Resp 18 | Wt 202.0 lb

## 2020-06-05 DIAGNOSIS — I2510 Atherosclerotic heart disease of native coronary artery without angina pectoris: Secondary | ICD-10-CM

## 2020-06-05 DIAGNOSIS — C9000 Multiple myeloma not having achieved remission: Secondary | ICD-10-CM

## 2020-06-05 DIAGNOSIS — Z9861 Coronary angioplasty status: Secondary | ICD-10-CM

## 2020-06-05 DIAGNOSIS — Z5112 Encounter for antineoplastic immunotherapy: Secondary | ICD-10-CM | POA: Diagnosis not present

## 2020-06-05 DIAGNOSIS — C9002 Multiple myeloma in relapse: Secondary | ICD-10-CM | POA: Diagnosis not present

## 2020-06-05 LAB — CBC WITH DIFFERENTIAL (CANCER CENTER ONLY)
Abs Immature Granulocytes: 0.01 10*3/uL (ref 0.00–0.07)
Basophils Absolute: 0 10*3/uL (ref 0.0–0.1)
Basophils Relative: 1 %
Eosinophils Absolute: 0 10*3/uL (ref 0.0–0.5)
Eosinophils Relative: 1 %
HCT: 36.9 % — ABNORMAL LOW (ref 39.0–52.0)
Hemoglobin: 12.1 g/dL — ABNORMAL LOW (ref 13.0–17.0)
Immature Granulocytes: 0 %
Lymphocytes Relative: 38 %
Lymphs Abs: 1.7 10*3/uL (ref 0.7–4.0)
MCH: 29.1 pg (ref 26.0–34.0)
MCHC: 32.8 g/dL (ref 30.0–36.0)
MCV: 88.7 fL (ref 80.0–100.0)
Monocytes Absolute: 0.5 10*3/uL (ref 0.1–1.0)
Monocytes Relative: 12 %
Neutro Abs: 2.1 10*3/uL (ref 1.7–7.7)
Neutrophils Relative %: 48 %
Platelet Count: 194 10*3/uL (ref 150–400)
RBC: 4.16 MIL/uL — ABNORMAL LOW (ref 4.22–5.81)
RDW: 14.1 % (ref 11.5–15.5)
WBC Count: 4.4 10*3/uL (ref 4.0–10.5)
nRBC: 0 % (ref 0.0–0.2)

## 2020-06-05 LAB — CMP (CANCER CENTER ONLY)
ALT: 46 U/L — ABNORMAL HIGH (ref 0–44)
AST: 23 U/L (ref 15–41)
Albumin: 4 g/dL (ref 3.5–5.0)
Alkaline Phosphatase: 121 U/L (ref 38–126)
Anion gap: 8 (ref 5–15)
BUN: 16 mg/dL (ref 8–23)
CO2: 27 mmol/L (ref 22–32)
Calcium: 9.7 mg/dL (ref 8.9–10.3)
Chloride: 104 mmol/L (ref 98–111)
Creatinine: 1.23 mg/dL (ref 0.61–1.24)
GFR, Estimated: 60 mL/min — ABNORMAL LOW (ref >60)
Glucose, Bld: 91 mg/dL (ref 70–99)
Potassium: 4.1 mmol/L (ref 3.5–5.1)
Sodium: 139 mmol/L (ref 135–145)
Total Bilirubin: 0.4 mg/dL (ref 0.3–1.2)
Total Protein: 6.4 g/dL — ABNORMAL LOW (ref 6.5–8.1)

## 2020-06-05 LAB — LACTATE DEHYDROGENASE: LDH: 161 U/L (ref 98–192)

## 2020-06-05 MED ORDER — DARATUMUMAB-HYALURONIDASE-FIHJ 1800-30000 MG-UT/15ML ~~LOC~~ SOLN
1800.0000 mg | Freq: Once | SUBCUTANEOUS | Status: AC
  Administered 2020-06-05: 1800 mg via SUBCUTANEOUS
  Filled 2020-06-05: qty 15

## 2020-06-05 MED ORDER — DIPHENHYDRAMINE HCL 25 MG PO CAPS: 50.0000 mg | ORAL_CAPSULE | Freq: Once | ORAL | Status: DC

## 2020-06-05 MED ORDER — DEXAMETHASONE 4 MG PO TABS: 20.0000 mg | ORAL_TABLET | Freq: Once | ORAL | Status: DC

## 2020-06-05 MED ORDER — BORTEZOMIB CHEMO SQ INJECTION 3.5 MG (2.5MG/ML)
1.3000 mg/m2 | Freq: Once | INTRAMUSCULAR | Status: AC
  Administered 2020-06-05: 2.75 mg via SUBCUTANEOUS
  Filled 2020-06-05: qty 1.1

## 2020-06-05 MED ORDER — ACETAMINOPHEN 325 MG PO TABS: 650.0000 mg | ORAL_TABLET | Freq: Once | ORAL | Status: DC

## 2020-06-05 MED ORDER — PROCHLORPERAZINE MALEATE 10 MG PO TABS: 10.0000 mg | ORAL_TABLET | Freq: Once | ORAL | Status: DC

## 2020-06-05 NOTE — Progress Notes (Signed)
Hematology and Oncology Follow Up Visit  Jahmani Shia [id number] April 10, 1940 80 y.o. 06/05/2020   Principle Diagnosis:  IgG kappa myeloma - Relapsed -- 1q duplication Acute thromboembolism of the right lower leg  Past Therapy: S/P ASCT at [facility] on 04/02/2015 Patient s/p cycle 5 of Velcade/Revlimid/Decadron Revlimid 10mg  po q day (21/28) -- d/c on 09/15/2017  Current Therapy:     Faspro/Velcade/Decadron -- s/p cycle #3 - start on 03/20/2020    Zometa 4 mg IV every3 months - next dose due 07/2020 Xarelto 10 mg by mouth daily   Interim History:  Mr. Sekel is here today for follow-up.  I am absolutely shocked as to what he just told me.  He was hospitalized at [facility].  He was hospitalized a couple weeks ago.  He was found to have a myocardial infarction.  He had a home percent blocked LAD.  This cannot be opened up.  He was placed on medical therapy.  His cardiac cath showed that he had a reduced left ventricular ejection fraction.  He also had bilateral pneumonia.  He was treated with antibiotics.  Again, I am very surprised by all of this.  I am glad that he got to [facility].  He obviously had fantastic care while over there.  He goes back to see his cardiologist.  As far as the myeloma is concerned, his last M spike was 0.2 g/dL.  The IgG level was 650 mg/dL.  The Kappa light chain was 10.7 mg/dL.  I do not think that the Faspro or Velcade had any impact on the cardiac status.  There is been no problems with bowels or bladder.  He has had no diarrhea.  His back has not bothered him as much.  Overall, performance status is ECOG 1.       Medications:  Allergies as of 06/05/2020      Reactions   Budesonide-formoterol Fumarate Itching, Other (See Comments)   Codeine Itching   Hydrocodone-acetaminophen Itching   Mushroom Extract Complex Nausea And Vomiting, Other (See Comments)   Only shitake mushroom  causes this reaction. Flu-like symptoms from portabello  mushrooms      Medication List       Accurate as of June 05, 2020  9:42 AM. If you have any questions, ask your nurse or doctor.        STOP taking these medications   amLODipine 5 MG tablet Commonly known as: NORVASC Stopped by: [name], MD   aspirin EC 81 MG tablet Stopped by: [name], MD   dexamethasone 4 MG tablet Commonly known as: DECADRON Stopped by: [name], MD   oxyCODONE-acetaminophen 10-325 MG tablet Commonly known as: PERCOCET Stopped by: [name], MD     TAKE these medications   acyclovir 400 MG tablet Commonly known as: ZOVIRAX Take 1 tablet (400 mg total) by mouth 2 (two) times daily.   benzonatate 200 MG capsule Commonly known as: TESSALON Take 1 capsule (200 mg total) by mouth 3 (three) times daily as needed for cough.   carvedilol 6.25 MG tablet Commonly known as: COREG Take 1 tablet (6.25 mg total) by mouth 2 (two) times daily with a meal.   cetirizine 10 MG tablet Commonly known as: EQ Allergy Relief (Cetirizine) Take 1 tablet (10 mg total) by mouth daily.   clopidogrel 75 MG tablet Commonly known as: PLAVIX Take 75 mg by mouth daily.   Cyanocobalamin 1000 MCG Tbcr Take 1 tablet by mouth daily.  esomeprazole 40 MG capsule Commonly known as: NEXIUM TAKE 1 CAPSULE DAILY AT 12 NOON   famciclovir 500 MG tablet Commonly known as: FAMVIR Take 1 tablet (500 mg total) by mouth daily.   fluticasone 50 MCG/ACT nasal spray Commonly known as: FLONASE Place 2 sprays into both nostrils daily.   furosemide 20 MG tablet Commonly known as: LASIX Take 20 mg by mouth daily.   gabapentin 600 MG tablet Commonly known as: NEURONTIN Take 1 tablet (600 mg total) by mouth 3 (three) times daily.   hydrochlorothiazide 12.5 MG capsule Commonly known as: MICROZIDE Take 1 capsule (12.5 mg total) by mouth daily.   hydrocortisone 2.5 % ointment Apply topically 2 (two) times daily.   isosorbide mononitrate 30 MG 24 hr  tablet Commonly known as: IMDUR Take 1 tablet (30 mg total) by mouth 2 (two) times daily.   levothyroxine 100 MCG tablet Commonly known as: Synthroid Take 1 tablet (100 mcg total) by mouth daily before breakfast.   LORazepam 1 MG tablet Commonly known as: ATIVAN Take 1 tablet (1 mg total) by mouth at bedtime.   LORazepam 0.5 MG tablet Commonly known as: Ativan Take 1 tablet (0.5 mg total) by mouth every 6 (six) hours as needed (Nausea or vomiting).   lubiprostone 24 MCG capsule Commonly known as: Amitiza 1 capsule 1-2 times daily for opiate-induced constipation   nitroGLYCERIN 0.4 MG SL tablet Commonly known as: NITROSTAT Place 1 tablet (0.4 mg total) under the tongue every 5 (five) minutes as needed. For chest pain   ondansetron 8 MG tablet Commonly known as: Zofran Take 1 tablet (8 mg total) by mouth 2 (two) times daily as needed (Nausea or vomiting).   potassium chloride SA 20 MEQ tablet Commonly known as: Klor-Con M20 Take 1 tablet (20 mEq total) by mouth 2 (two) times daily.   prochlorperazine 10 MG tablet Commonly known as: COMPAZINE Take 1 tablet (10 mg total) by mouth every 6 (six) hours as needed (Nausea or vomiting).   pyridoxine 100 MG tablet Commonly known as: B-6 Take 100 mg by mouth daily.   rivaroxaban 10 MG Tabs tablet Commonly known as: XARELTO Take 1 tablet (10 mg total) by mouth daily with supper.   rosuvastatin 40 MG tablet Commonly known as: CRESTOR Take 40 mg by mouth at bedtime.   sacubitril-valsartan 24-26 MG Commonly known as: ENTRESTO Take 1 tablet by mouth in the morning and at bedtime.   tamsulosin 0.4 MG Caps capsule Commonly known as: FLOMAX Take 1 capsule (0.4 mg total) by mouth daily after supper.       Allergies:  Allergies  Allergen Reactions  . Budesonide-Formoterol Fumarate Itching and Other (See Comments)  . Codeine Itching  . Hydrocodone-Acetaminophen Itching  . Mushroom Extract Complex Nausea And Vomiting and  Other (See Comments)    Only shitake mushroom  causes this reaction. Flu-like symptoms from portabello mushrooms    Past Medical History, Surgical history, Social history, and Family History were reviewed and updated.  Review of Systems: Review of Systems  HENT: Negative.   Eyes: Negative.   Respiratory: Negative.   Cardiovascular: Positive for chest pain.  Gastrointestinal: Negative.   Genitourinary: Negative.   Musculoskeletal: Positive for back pain.  Skin: Negative.   Neurological: Negative.   Endo/Heme/Allergies: Negative.   Psychiatric/Behavioral: Negative.      Physical Exam:  weight is 202 lb (91.6 kg). His oral temperature is 97.7 F (36.5 C). His blood pressure is 107/63 and his pulse is 63. His respiration  is 18 and oxygen saturation is 98%.   Wt Readings from Last 3 Encounters:  06/05/20 202 lb (91.6 kg)  05/06/20 204 lb (92.5 kg)  04/10/20 203 lb (92.1 kg)    Physical Exam Vitals reviewed.  HENT:     Head: Normocephalic and atraumatic.  Eyes:     Pupils: Pupils are equal, round, and reactive to light.  Cardiovascular:     Rate and Rhythm: Normal rate and regular rhythm.     Heart sounds: Normal heart sounds.  Pulmonary:     Effort: Pulmonary effort is normal.     Breath sounds: Normal breath sounds.  Abdominal:     General: Bowel sounds are normal.     Palpations: Abdomen is soft.  Musculoskeletal:        General: No tenderness or deformity. Normal range of motion.     Cervical back: Normal range of motion.  Lymphadenopathy:     Cervical: No cervical adenopathy.  Skin:    General: Skin is warm and dry.     Findings: No erythema or rash.  Neurological:     Mental Status: He is alert and oriented to person, place, and time.  Psychiatric:        Behavior: Behavior normal.        Thought Content: Thought content normal.        Judgment: Judgment normal.      Lab Results  Component Value Date   WBC 4.4 06/05/2020   HGB 12.1 (L) 06/05/2020    HCT 36.9 (L) 06/05/2020   MCV 88.7 06/05/2020   PLT 194 06/05/2020   Lab Results  Component Value Date   FERRITIN 199 07/18/2019   IRON 65 07/18/2019   TIBC 316 07/18/2019   UIBC 251 07/18/2019   IRONPCTSAT 21 07/18/2019   Lab Results  Component Value Date   RETICCTPCT 1.7 07/18/2019   RBC 4.16 (L) 06/05/2020   Lab Results  Component Value Date   KPAFRELGTCHN 106.5 (H) 05/06/2020   LAMBDASER 3.3 (L) 05/06/2020   KAPLAMBRATIO 32.27 (H) 05/06/2020   Lab Results  Component Value Date   IGGSERUM 657 05/06/2020   IGA 16 (L) 05/06/2020   IGMSERUM 20 05/06/2020   Lab Results  Component Value Date   TOTALPROTELP 6.0 05/06/2020   ALBUMINELP 3.4 05/06/2020   A1GS 0.2 05/06/2020   A2GS 0.8 05/06/2020   BETS 1.0 05/06/2020   BETA2SER 0.3 02/11/2015   GAMS 0.5 05/06/2020   MSPIKE 0.2 (H) 05/06/2020   SPEI Comment 10/30/2019     Chemistry      Component Value Date/Time   NA 139 06/05/2020 0846   NA 145 (H) 06/28/2019 1501   NA 140 01/27/2017 1044   NA 141 06/05/2015 1036   K 4.1 06/05/2020 0846   K 3.3 01/27/2017 1044   K 2.9 (LL) 06/05/2015 1036   CL 104 06/05/2020 0846   CL 101 01/27/2017 1044   CO2 27 06/05/2020 0846   CO2 28 01/27/2017 1044   CO2 27 06/05/2015 1036   BUN 16 06/05/2020 0846   BUN 15 06/28/2019 1501   BUN 14 01/27/2017 1044   BUN 13.1 06/05/2015 1036   CREATININE 1.23 06/05/2020 0846   CREATININE 1.16 10/08/2018 1531   CREATININE 1.0 06/05/2015 1036   GLU 152 03/30/2016 0000      Component Value Date/Time   CALCIUM 9.7 06/05/2020 0846   CALCIUM 8.9 01/27/2017 1044   CALCIUM 9.4 06/05/2015 1036   ALKPHOS 121 06/05/2020 0846  ALKPHOS 126 (H) 01/27/2017 1044   ALKPHOS 94 06/05/2015 1036   AST 23 06/05/2020 0846   AST 22 06/05/2015 1036   ALT 46 (H) 06/05/2020 0846   ALT 33 01/27/2017 1044   ALT 18 06/05/2015 1036   BILITOT 0.4 06/05/2020 0846   BILITOT 0.59 06/05/2015 1036       Impression and Plan: Mr. Tomko is a very  pleasant 80 yo caucasian gentleman with history of IgG kappa myeloma.  He underwent standard induction chemotherapy and then ultimately stem cell transplant at [facility] in February 2017.   I think will be very interesting to see what happens with this set of values for his myeloma.  If we see that he is going up with his monoclonal studies, we are going to have to make a change.  The problem is that with his cardiac issue, I am not sure we could utilize Kyprolis.  I know this is incredibly complicated.  Is going to do a 24-hour urine for [place] this time.  I will plan to get him back in another 3 weeks or so.  [name], MD 4/22/20229:42 AM

## 2020-06-05 NOTE — Patient Instructions (Signed)
Bortezomib injection What is this medicine? BORTEZOMIB (bor TEZ oh mib) targets proteins in cancer cells and stops the cancer cells from growing. It treats multiple myeloma and mantle cell lymphoma. This medicine may be used for other purposes; ask your health care provider or pharmacist if you have questions. COMMON BRAND NAME(S): Velcade What should I tell my health care provider before I take this medicine? They need to know if you have any of these conditions:  dehydration  diabetes (high blood sugar)  heart disease  liver disease  tingling of the fingers or toes or other nerve disorder  an unusual or allergic reaction to bortezomib, mannitol, boron, other medicines, foods, dyes, or preservatives  pregnant or trying to get pregnant  breast-feeding How should I use this medicine? This medicine is injected into a vein or under the skin. It is given by a health care provider in a hospital or clinic setting. Talk to your health care provider about the use of this medicine in children. Special care may be needed. Overdosage: If you think you have taken too much of this medicine contact a poison control center or emergency room at once. NOTE: This medicine is only for you. Do not share this medicine with others. What if I miss a dose? Keep appointments for follow-up doses. It is important not to miss your dose. Call your health care provider if you are unable to keep an appointment. What may interact with this medicine? This medicine may interact with the following medications:  ketoconazole  rifampin This list may not describe all possible interactions. Give your health care provider a list of all the medicines, herbs, non-prescription drugs, or dietary supplements you use. Also tell them if you smoke, drink alcohol, or use illegal drugs. Some items may interact with your medicine. What should I watch for while using this medicine? Your condition will be monitored carefully while  you are receiving this medicine. You may need blood work done while you are taking this medicine. You may get drowsy or dizzy. Do not drive, use machinery, or do anything that needs mental alertness until you know how this medicine affects you. Do not stand up or sit up quickly, especially if you are an older patient. This reduces the risk of dizzy or fainting spells This medicine may increase your risk of getting an infection. Call your health care provider for advice if you get a fever, chills, sore throat, or other symptoms of a cold or flu. Do not treat yourself. Try to avoid being around people who are sick. Check with your health care provider if you have severe diarrhea, nausea, and vomiting, or if you sweat a lot. The loss of too much body fluid may make it dangerous for you to take this medicine. Do not become pregnant while taking this medicine or for 7 months after stopping it. Women should inform their health care provider if they wish to become pregnant or think they might be pregnant. Men should not father a child while taking this medicine and for 4 months after stopping it. There is a potential for serious harm to an unborn child. Talk to your health care provider for more information. Do not breast-feed an infant while taking this medicine or for 2 months after stopping it. This medicine may make it more difficult to get pregnant or father a child. Talk to your health care provider if you are concerned about your fertility. What side effects may I notice from receiving this medicine?   Side effects that you should report to your doctor or health care professional as soon as possible: °· allergic reactions (skin rash; itching or hives; swelling of the face, lips, or tongue) °· bleeding (bloody or black, tarry stools; red or dark brown urine; spitting up blood or brown material that looks like coffee grounds; red spots on the skin; unusual bruising or bleeding from the eye, gums, or  nose) °· blurred vision or changes in vision °· confusion °· constipation °· headache °· heart failure (trouble breathing; fast, irregular heartbeat; sudden weight gain; swelling of the ankles, feet, hands) °· infection (fever, chills, cough, sore throat, pain or trouble passing urine) °· lack or loss of appetite °· liver injury (dark yellow or brown urine; general ill feeling or flu-like symptoms; loss of appetite, right upper belly pain; yellowing of the eyes or skin) °· low blood pressure (dizziness; feeling faint or lightheaded, falls; unusually weak or tired) °· muscle cramps °· pain, redness, or irritation at site where injected °· pain, tingling, numbness in the hands or feet °· seizures °· trouble breathing °· unusual bruising or bleeding °Side effects that usually do not require medical attention (report to your doctor or health care professional if they continue or are bothersome): °· diarrhea °· nausea °· stomach pain °· trouble sleeping °· vomiting °This list may not describe all possible side effects. Call your doctor for medical advice about side effects. You may report side effects to FDA at 1-800-FDA-1088. °Where should I keep my medicine? °This medicine is given in a hospital or clinic. It will not be stored at home. °NOTE: This sheet is a summary. It may not cover all possible information. If you have questions about this medicine, talk to your doctor, pharmacist, or health care provider. °© 2021 [employer] Standard (2020-01-23 13:22:53) °Daratumumab injection °What is this medicine? °DARATUMUMAB (dar a toom ue mab) is a monoclonal antibody. It is used to treat multiple myeloma. °This medicine may be used for other purposes; ask your health care provider or pharmacist if you have questions. °COMMON BRAND NAME(S): DARZALEX °What should I tell my health care provider before I take this medicine? °They need to know if you have any of these conditions: °· hereditary fructose intolerance °· infection  (especially a virus infection such as chickenpox, herpes, or hepatitis B virus) °· lung or breathing disease (asthma, COPD) °· an unusual or allergic reaction to daratumumab, sorbitol, other medicines, foods, dyes, or preservatives °· pregnant or trying to get pregnant °· breast-feeding °How should I use this medicine? °This medicine is for infusion into a vein. It is given by a health care professional in a hospital or clinic setting. °Talk to your pediatrician regarding the use of this medicine in children. Special care may be needed. °Overdosage: If you think you have taken too much of this medicine contact a poison control center or emergency room at once. °NOTE: This medicine is only for you. Do not share this medicine with others. °What if I miss a dose? °Keep appointments for follow-up doses as directed. It is important not to miss your dose. Call your doctor or health care professional if you are unable to keep an appointment. °What may interact with this medicine? °Interactions have not been studied. °This list may not describe all possible interactions. Give your health care provider a list of all the medicines, herbs, non-prescription drugs, or dietary supplements you use. Also tell them if you smoke, drink alcohol, or use illegal drugs. Some items may   interact with your medicine. °What should I watch for while using this medicine? °Your condition will be monitored carefully while you are receiving this medicine. °This medicine can cause serious allergic reactions. To reduce your risk, your health care provider may give you other medicine to take before receiving this one. Be sure to follow the directions from your health care provider. °This medicine can affect the results of blood tests to match your blood type. These changes can last for up to 6 months after the final dose. Your healthcare provider will do blood tests to match your blood type before you start treatment. Tell all of your healthcare  providers that you are being treated with this medicine before receiving a blood transfusion. °This medicine can affect the results of some tests used to determine treatment response; extra tests may be needed to evaluate response. °Do not become pregnant while taking this medicine or for 3 months after stopping it. Women should inform their health care provider if they wish to become pregnant or think they might be pregnant. There is a potential for serious side effects to an unborn child. Talk to your health care provider for more information. Do not breast-feed an infant while taking this medicine. °What side effects may I notice from receiving this medicine? °Side effects that you should report to your doctor or health care professional as soon as possible: °· allergic reactions (skin rash; itching or hives; swelling of the face, lips, or tongue) °· infection (fever, chills, cough, sore throat, pain or difficulty passing urine) °· infusion reaction (dizziness, fast heartbeat, feeling faint or lightheaded, falls, headache, increase in blood pressure, nausea, vomiting, or wheezing or trouble breathing with loud or whistling sounds) °· unusual bleeding or bruising °Side effects that usually do not require medical attention (report to your doctor or health care professional if they continue or are bothersome): °· constipation °· diarrhea °· pain, tingling, numbness in the hands or feet °· swelling of the ankles, feet, hands °· tiredness °This list may not describe all possible side effects. Call your doctor for medical advice about side effects. You may report side effects to FDA at 1-800-FDA-1088. °Where should I keep my medicine? °This drug is given in a hospital or clinic and will not be stored at home. °NOTE: This sheet is a summary. It may not cover all possible information. If you have questions about this medicine, talk to your doctor, pharmacist, or health care provider. °© 2021 [employer] Standard  (2020-01-23 13:28:52) ° °

## 2020-06-05 NOTE — Progress Notes (Signed)
Both chemo drugs verified by 2 RNs, including expiration and integrity of appearance.

## 2020-06-05 NOTE — Telephone Encounter (Signed)
Per los 06/05/20 - called and patient upcoming appointments

## 2020-06-06 LAB — IGG, IGA, IGM
IgA: 22 mg/dL — ABNORMAL LOW (ref 61–437)
IgG (Immunoglobin G), Serum: 553 mg/dL — ABNORMAL LOW (ref 603–1613)
IgM (Immunoglobulin M), Srm: 20 mg/dL (ref 15–143)

## 2020-06-08 ENCOUNTER — Encounter: Payer: Self-pay | Admitting: Sports Medicine

## 2020-06-08 ENCOUNTER — Ambulatory Visit (INDEPENDENT_AMBULATORY_CARE_PROVIDER_SITE_OTHER): Payer: Medicare Other | Admitting: Sports Medicine

## 2020-06-08 ENCOUNTER — Other Ambulatory Visit: Payer: Self-pay

## 2020-06-08 ENCOUNTER — Inpatient Hospital Stay: Payer: Medicare Other

## 2020-06-08 DIAGNOSIS — Z9861 Coronary angioplasty status: Secondary | ICD-10-CM | POA: Diagnosis not present

## 2020-06-08 DIAGNOSIS — M47815 Spondylosis without myelopathy or radiculopathy, thoracolumbar region: Secondary | ICD-10-CM

## 2020-06-08 DIAGNOSIS — K5903 Drug induced constipation: Secondary | ICD-10-CM | POA: Diagnosis not present

## 2020-06-08 DIAGNOSIS — I2510 Atherosclerotic heart disease of native coronary artery without angina pectoris: Secondary | ICD-10-CM | POA: Diagnosis not present

## 2020-06-08 DIAGNOSIS — T402X5A Adverse effect of other opioids, initial encounter: Secondary | ICD-10-CM

## 2020-06-08 DIAGNOSIS — C9000 Multiple myeloma not having achieved remission: Secondary | ICD-10-CM

## 2020-06-08 DIAGNOSIS — C9002 Multiple myeloma in relapse: Secondary | ICD-10-CM | POA: Diagnosis not present

## 2020-06-08 DIAGNOSIS — Z5112 Encounter for antineoplastic immunotherapy: Secondary | ICD-10-CM | POA: Diagnosis not present

## 2020-06-08 LAB — KAPPA/LAMBDA LIGHT CHAINS
Kappa free light chain: 129.5 mg/L — ABNORMAL HIGH (ref 3.3–19.4)
Kappa, lambda light chain ratio: 33.21 — ABNORMAL HIGH (ref 0.26–1.65)
Lambda free light chains: 3.9 mg/L — ABNORMAL LOW (ref 5.7–26.3)

## 2020-06-08 MED ORDER — OXYCODONE-ACETAMINOPHEN 10-325 MG PO TABS: 1.0000 | ORAL_TABLET | Freq: Three times a day (TID) | ORAL | 0 refills | Status: DC | PRN

## 2020-06-08 MED ORDER — LUBIPROSTONE 24 MCG PO CAPS: ORAL_CAPSULE | ORAL | 11 refills | Status: DC

## 2020-06-08 MED ORDER — EZETIMIBE 10 MG PO TABS
10.0000 mg | ORAL_TABLET | Freq: Every day | ORAL | 3 refills | Status: DC
Start: 2020-06-08 — End: 2020-07-08

## 2020-06-08 NOTE — Assessment & Plan Note (Signed)
We did use Movantik, this was a bit expensive, switch to Amitiza which is appropriate, refilling.

## 2020-06-08 NOTE — Progress Notes (Signed)
    Procedures performed today:    None.  Independent interpretation of notes and tests performed by another provider:   None.  Brief History, Exam, Impression, and Recommendations:    CAD S/P percutaneous coronary angioplasty [name] [name] returns, he was recently admitted for by lobar pneumonia as well as NSTEMI. Catheterization performed with 1% occlusion of the LAD, no PCI performed, this is planned for outpatient setting. Echocardiogram in hospital showed an ejection fraction of 45%. Overall he is doing well, still with a bit of shortness of breath, he is somewhat hypotensive with symptoms today, there were a lot of medication changes made. I do think we should go ahead and discontinue the Lasix that was started in the hospital, he is also on low-dose Entresto that we will leave alone for now, his carvedilol was decreased. We will restart his Zetia, and he does have an appointment coming up with cardiology.  Therapeutic opioid induced constipation We did use Movantik, this was a bit expensive, switch to Amitiza which is appropriate, refilling.    ___________________________________________ Gwen Her. Dianah Field, M.D., ABFM., CAQSM. Primary Care and [place] [employer] [facility] [employer]

## 2020-06-08 NOTE — Assessment & Plan Note (Signed)
[name] [name] returns, he was recently admitted for by lobar pneumonia as well as NSTEMI. Catheterization performed with 1% occlusion of the LAD, no PCI performed, this is planned for outpatient setting. Echocardiogram in hospital showed an ejection fraction of 45%. Overall he is doing well, still with a bit of shortness of breath, he is somewhat hypotensive with symptoms today, there were a lot of medication changes made. I do think we should go ahead and discontinue the Lasix that was started in the hospital, he is also on low-dose Entresto that we will leave alone for now, his carvedilol was decreased. We will restart his Zetia, and he does have an appointment coming up with cardiology.

## 2020-06-09 ENCOUNTER — Other Ambulatory Visit: Payer: Self-pay

## 2020-06-09 ENCOUNTER — Telehealth: Payer: Self-pay

## 2020-06-09 DIAGNOSIS — I214 Non-ST elevation (NSTEMI) myocardial infarction: Secondary | ICD-10-CM | POA: Diagnosis not present

## 2020-06-09 DIAGNOSIS — E119 Type 2 diabetes mellitus without complications: Secondary | ICD-10-CM | POA: Diagnosis not present

## 2020-06-09 DIAGNOSIS — I25119 Atherosclerotic heart disease of native coronary artery with unspecified angina pectoris: Secondary | ICD-10-CM | POA: Diagnosis not present

## 2020-06-09 DIAGNOSIS — I10 Essential (primary) hypertension: Secondary | ICD-10-CM | POA: Diagnosis not present

## 2020-06-09 DIAGNOSIS — I255 Ischemic cardiomyopathy: Secondary | ICD-10-CM | POA: Diagnosis not present

## 2020-06-09 DIAGNOSIS — C9000 Multiple myeloma not having achieved remission: Secondary | ICD-10-CM

## 2020-06-09 DIAGNOSIS — J439 Emphysema, unspecified: Secondary | ICD-10-CM | POA: Diagnosis not present

## 2020-06-09 LAB — UPEP/UIFE/LIGHT CHAINS/TP, 24-HR UR
% BETA, Urine: 47.4 %
ALPHA 1 URINE: 6.3 %
Albumin, U: 25.4 %
Alpha 2, Urine: 14.3 %
Free Kappa Lt Chains,Ur: 130.05 mg/L — ABNORMAL HIGH (ref 1.17–86.46)
Free Kappa/Lambda Ratio: 14.73 — ABNORMAL HIGH (ref 1.83–14.26)
Free Lambda Lt Chains,Ur: 8.83 mg/L (ref 0.27–15.21)
GAMMA GLOBULIN URINE: 6.6 %
M-SPIKE %, Urine: 8.9 % — ABNORMAL HIGH
M-Spike, Mg/24 Hr: 17 mg/24 hr — ABNORMAL HIGH
Total Protein, Urine-Ur/day: 189 mg/24 hr — ABNORMAL HIGH (ref 30–150)
Total Protein, Urine: 7 mg/dL
Total Volume: 2700

## 2020-06-09 NOTE — Telephone Encounter (Signed)
That is fine, I did tell him to go ahead and restart his Zetia.  Everything else can be updated.

## 2020-06-09 NOTE — Telephone Encounter (Signed)
[name] [name] called to correct patient's med list based on our AVS.   Patient is taking:   Dexamethasone/Guafenasin PRN cough Dexamethsone 4mg  prior to chemo treatments Lorazepam should be 1mg  not 1/2 mg  Patient is NOT taking:   Acyclovir HCTZ Zetia lubiprostone ($$$)  Please let me know that these are correct and I will correct patient's med list.

## 2020-06-10 DIAGNOSIS — I10 Essential (primary) hypertension: Secondary | ICD-10-CM | POA: Diagnosis not present

## 2020-06-10 DIAGNOSIS — I259 Chronic ischemic heart disease, unspecified: Secondary | ICD-10-CM | POA: Diagnosis not present

## 2020-06-10 LAB — PROTEIN ELECTROPHORESIS, SERUM, WITH REFLEX
A/G Ratio: 1.4 (ref 0.7–1.7)
Albumin ELP: 3.6 g/dL (ref 2.9–4.4)
Alpha-1-Globulin: 0.3 g/dL (ref 0.0–0.4)
Alpha-2-Globulin: 0.9 g/dL (ref 0.4–1.0)
Beta Globulin: 1 g/dL (ref 0.7–1.3)
Gamma Globulin: 0.4 g/dL (ref 0.4–1.8)
Globulin, Total: 2.5 g/dL (ref 2.2–3.9)
M-Spike, %: 0.1 g/dL — ABNORMAL HIGH
SPEP Interpretation: 0
Total Protein ELP: 6.1 g/dL (ref 6.0–8.5)

## 2020-06-10 LAB — IMMUNOFIXATION REFLEX, SERUM
IgA: 25 mg/dL — ABNORMAL LOW (ref 61–437)
IgG (Immunoglobin G), Serum: 576 mg/dL — ABNORMAL LOW (ref 603–1613)
IgM (Immunoglobulin M), Srm: 23 mg/dL (ref 15–143)

## 2020-06-10 NOTE — Telephone Encounter (Signed)
Spoke with Roselyn Reef who stated that patient doesn't want to take zetia because he was told by someone that it elevates the liver.

## 2020-06-10 NOTE — Telephone Encounter (Signed)
I am only here as a Optometrist.  Patients can do what they want.

## 2020-06-11 ENCOUNTER — Inpatient Hospital Stay: Payer: Medicare Other

## 2020-06-11 ENCOUNTER — Other Ambulatory Visit: Payer: Self-pay

## 2020-06-11 VITALS — BP 123/70 | HR 60 | Temp 97.9°F | Resp 18

## 2020-06-11 DIAGNOSIS — C9002 Multiple myeloma in relapse: Secondary | ICD-10-CM | POA: Diagnosis not present

## 2020-06-11 DIAGNOSIS — C9000 Multiple myeloma not having achieved remission: Secondary | ICD-10-CM

## 2020-06-11 DIAGNOSIS — Z5112 Encounter for antineoplastic immunotherapy: Secondary | ICD-10-CM | POA: Diagnosis not present

## 2020-06-11 LAB — CBC WITH DIFFERENTIAL/PLATELET
Abs Immature Granulocytes: 0.01 10*3/uL (ref 0.00–0.07)
Basophils Absolute: 0 10*3/uL (ref 0.0–0.1)
Basophils Relative: 0 %
Eosinophils Absolute: 0 10*3/uL (ref 0.0–0.5)
Eosinophils Relative: 1 %
HCT: 38.8 % — ABNORMAL LOW (ref 39.0–52.0)
Hemoglobin: 12.5 g/dL — ABNORMAL LOW (ref 13.0–17.0)
Immature Granulocytes: 0 %
Lymphocytes Relative: 36 %
Lymphs Abs: 1.8 10*3/uL (ref 0.7–4.0)
MCH: 29.1 pg (ref 26.0–34.0)
MCHC: 32.2 g/dL (ref 30.0–36.0)
MCV: 90.4 fL (ref 80.0–100.0)
Monocytes Absolute: 0.7 10*3/uL (ref 0.1–1.0)
Monocytes Relative: 13 %
Neutro Abs: 2.5 10*3/uL (ref 1.7–7.7)
Neutrophils Relative %: 50 %
Platelets: 137 10*3/uL — ABNORMAL LOW (ref 150–400)
RBC: 4.29 MIL/uL (ref 4.22–5.81)
RDW: 14.6 % (ref 11.5–15.5)
WBC: 5.1 10*3/uL (ref 4.0–10.5)
nRBC: 0 % (ref 0.0–0.2)

## 2020-06-11 LAB — COMPREHENSIVE METABOLIC PANEL
ALT: 37 U/L (ref 0–44)
AST: 24 U/L (ref 15–41)
Albumin: 3.9 g/dL (ref 3.5–5.0)
Alkaline Phosphatase: 91 U/L (ref 38–126)
Anion gap: 6 (ref 5–15)
BUN: 20 mg/dL (ref 8–23)
CO2: 29 mmol/L (ref 22–32)
Calcium: 9.4 mg/dL (ref 8.9–10.3)
Chloride: 105 mmol/L (ref 98–111)
Creatinine, Ser: 1.21 mg/dL (ref 0.61–1.24)
GFR, Estimated: >60 mL/min (ref >60)
Glucose, Bld: 92 mg/dL (ref 70–99)
Potassium: 4.6 mmol/L (ref 3.5–5.1)
Sodium: 140 mmol/L (ref 135–145)
Total Bilirubin: 0.6 mg/dL (ref 0.3–1.2)
Total Protein: 6 g/dL — ABNORMAL LOW (ref 6.5–8.1)

## 2020-06-11 MED ORDER — PROCHLORPERAZINE MALEATE 10 MG PO TABS: 10.0000 mg | ORAL_TABLET | Freq: Once | ORAL | Status: DC

## 2020-06-11 MED ORDER — BORTEZOMIB CHEMO SQ INJECTION 3.5 MG (2.5MG/ML)
1.3000 mg/m2 | Freq: Once | INTRAMUSCULAR | Status: AC
  Administered 2020-06-11: 2.75 mg via SUBCUTANEOUS
  Filled 2020-06-11: qty 1.1

## 2020-06-11 NOTE — Patient Instructions (Signed)
[facility]  Discharge Instructions: Thank you for choosing [facility] to provide your oncology and hematology care.   If you have a lab appointment with the Cancer Center, please go directly to the Cancer Center and check in at the registration area.  Wear comfortable clothing and clothing appropriate for easy access to any Portacath or PICC line.   We strive to give you quality time with your provider. You may need to reschedule your appointment if you arrive late (15 or more minutes).  Arriving late affects you and other patients whose appointments are after yours.  Also, if you miss three or more appointments without notifying the office, you may be dismissed from the clinic at the provider's discretion.      For prescription refill requests, have your pharmacy contact our office and allow 72 hours for refills to be completed.    Today you received the following chemotherapy and/or immunotherapy agents Velcade      To help prevent nausea and vomiting after your treatment, we encourage you to take your nausea medication as directed.  BELOW ARE SYMPTOMS THAT SHOULD BE REPORTED IMMEDIATELY: *FEVER GREATER THAN 100.4 F (38 C) OR HIGHER *CHILLS OR SWEATING *NAUSEA AND VOMITING THAT IS NOT CONTROLLED WITH YOUR NAUSEA MEDICATION *UNUSUAL SHORTNESS OF BREATH *UNUSUAL BRUISING OR BLEEDING *URINARY PROBLEMS (pain or burning when urinating, or frequent urination) *BOWEL PROBLEMS (unusual diarrhea, constipation, pain near the anus) TENDERNESS IN MOUTH AND THROAT WITH OR WITHOUT PRESENCE OF ULCERS (sore throat, sores in mouth, or a toothache) UNUSUAL RASH, SWELLING OR PAIN  UNUSUAL VAGINAL DISCHARGE OR ITCHING   Items with * indicate a potential emergency and should be followed up as soon as possible or go to the Emergency Department if any problems should occur.  Please show the CHEMOTHERAPY ALERT CARD or IMMUNOTHERAPY ALERT CARD at check-in to the  Emergency Department and triage nurse. Should you have questions after your visit or need to cancel or reschedule your appointment, please contact [facility]  [phone] and follow the prompts.  Office hours are 8:00 a.m. to 4:30 p.m. Monday - Friday. Please note that voicemails left after 4:00 p.m. may not be returned until the following business day.  We are closed weekends and major holidays. You have access to a nurse at all times for urgent questions. Please call the main number to the clinic [phone] and follow the prompts.  For any non-urgent questions, you may also contact your provider using MyChart. We now offer e-Visits for anyone 18 and older to request care online for non-urgent symptoms. For details visit [email or web address].   Also download the MyChart app! Go to the app store, search "MyChart", open the app, select [facility], and log in with your MyChart username and password.  Due to Covid, a mask is required upon entering the hospital/clinic. If you do not have a mask, one will be given to you upon arrival. For doctor visits, patients may have 1 support person aged 18 or older with them. For treatment visits, patients cannot have anyone with them due to current Covid guidelines and our immunocompromised population.  

## 2020-06-12 ENCOUNTER — Other Ambulatory Visit: Payer: Self-pay | Admitting: *Deleted

## 2020-06-12 DIAGNOSIS — I214 Non-ST elevation (NSTEMI) myocardial infarction: Secondary | ICD-10-CM | POA: Diagnosis not present

## 2020-06-12 DIAGNOSIS — I25119 Atherosclerotic heart disease of native coronary artery with unspecified angina pectoris: Secondary | ICD-10-CM | POA: Diagnosis not present

## 2020-06-12 DIAGNOSIS — J439 Emphysema, unspecified: Secondary | ICD-10-CM | POA: Diagnosis not present

## 2020-06-12 DIAGNOSIS — E119 Type 2 diabetes mellitus without complications: Secondary | ICD-10-CM | POA: Diagnosis not present

## 2020-06-12 DIAGNOSIS — C9000 Multiple myeloma not having achieved remission: Secondary | ICD-10-CM

## 2020-06-12 DIAGNOSIS — I255 Ischemic cardiomyopathy: Secondary | ICD-10-CM | POA: Diagnosis not present

## 2020-06-12 DIAGNOSIS — I10 Essential (primary) hypertension: Secondary | ICD-10-CM | POA: Diagnosis not present

## 2020-06-12 MED ORDER — ONDANSETRON HCL 8 MG PO TABS: 8.0000 mg | ORAL_TABLET | Freq: Two times a day (BID) | ORAL | 1 refills | Status: DC | PRN

## 2020-06-12 MED ORDER — PROCHLORPERAZINE MALEATE 10 MG PO TABS: 10.0000 mg | ORAL_TABLET | Freq: Four times a day (QID) | ORAL | 1 refills | Status: DC | PRN

## 2020-06-16 ENCOUNTER — Other Ambulatory Visit: Payer: Self-pay

## 2020-06-16 DIAGNOSIS — E119 Type 2 diabetes mellitus without complications: Secondary | ICD-10-CM | POA: Diagnosis not present

## 2020-06-16 DIAGNOSIS — C9000 Multiple myeloma not having achieved remission: Secondary | ICD-10-CM

## 2020-06-16 DIAGNOSIS — I25119 Atherosclerotic heart disease of native coronary artery with unspecified angina pectoris: Secondary | ICD-10-CM | POA: Diagnosis not present

## 2020-06-16 DIAGNOSIS — I255 Ischemic cardiomyopathy: Secondary | ICD-10-CM | POA: Diagnosis not present

## 2020-06-16 DIAGNOSIS — I10 Essential (primary) hypertension: Secondary | ICD-10-CM | POA: Diagnosis not present

## 2020-06-16 DIAGNOSIS — J439 Emphysema, unspecified: Secondary | ICD-10-CM | POA: Diagnosis not present

## 2020-06-16 DIAGNOSIS — I214 Non-ST elevation (NSTEMI) myocardial infarction: Secondary | ICD-10-CM | POA: Diagnosis not present

## 2020-06-16 MED ORDER — DEXAMETHASONE 4 MG PO TABS: 20.0000 mg | ORAL_TABLET | ORAL | 0 refills | Status: DC

## 2020-06-18 DIAGNOSIS — I255 Ischemic cardiomyopathy: Secondary | ICD-10-CM | POA: Diagnosis not present

## 2020-06-18 DIAGNOSIS — E119 Type 2 diabetes mellitus without complications: Secondary | ICD-10-CM | POA: Diagnosis not present

## 2020-06-18 DIAGNOSIS — I10 Essential (primary) hypertension: Secondary | ICD-10-CM | POA: Diagnosis not present

## 2020-06-18 DIAGNOSIS — I214 Non-ST elevation (NSTEMI) myocardial infarction: Secondary | ICD-10-CM | POA: Diagnosis not present

## 2020-06-18 DIAGNOSIS — I25119 Atherosclerotic heart disease of native coronary artery with unspecified angina pectoris: Secondary | ICD-10-CM | POA: Diagnosis not present

## 2020-06-18 DIAGNOSIS — J439 Emphysema, unspecified: Secondary | ICD-10-CM | POA: Diagnosis not present

## 2020-06-23 DIAGNOSIS — I214 Non-ST elevation (NSTEMI) myocardial infarction: Secondary | ICD-10-CM | POA: Diagnosis not present

## 2020-06-23 DIAGNOSIS — E119 Type 2 diabetes mellitus without complications: Secondary | ICD-10-CM | POA: Diagnosis not present

## 2020-06-23 DIAGNOSIS — I10 Essential (primary) hypertension: Secondary | ICD-10-CM | POA: Diagnosis not present

## 2020-06-23 DIAGNOSIS — J439 Emphysema, unspecified: Secondary | ICD-10-CM | POA: Diagnosis not present

## 2020-06-23 DIAGNOSIS — I255 Ischemic cardiomyopathy: Secondary | ICD-10-CM | POA: Diagnosis not present

## 2020-06-23 DIAGNOSIS — I25119 Atherosclerotic heart disease of native coronary artery with unspecified angina pectoris: Secondary | ICD-10-CM | POA: Diagnosis not present

## 2020-06-25 DIAGNOSIS — Z9181 History of falling: Secondary | ICD-10-CM | POA: Diagnosis not present

## 2020-06-25 DIAGNOSIS — C9000 Multiple myeloma not having achieved remission: Secondary | ICD-10-CM | POA: Diagnosis not present

## 2020-06-25 DIAGNOSIS — I255 Ischemic cardiomyopathy: Secondary | ICD-10-CM | POA: Diagnosis not present

## 2020-06-25 DIAGNOSIS — K449 Diaphragmatic hernia without obstruction or gangrene: Secondary | ICD-10-CM | POA: Diagnosis not present

## 2020-06-25 DIAGNOSIS — Z79891 Long term (current) use of opiate analgesic: Secondary | ICD-10-CM | POA: Diagnosis not present

## 2020-06-25 DIAGNOSIS — Z7901 Long term (current) use of anticoagulants: Secondary | ICD-10-CM | POA: Diagnosis not present

## 2020-06-25 DIAGNOSIS — D696 Thrombocytopenia, unspecified: Secondary | ICD-10-CM | POA: Diagnosis not present

## 2020-06-25 DIAGNOSIS — J439 Emphysema, unspecified: Secondary | ICD-10-CM | POA: Diagnosis not present

## 2020-06-25 DIAGNOSIS — I10 Essential (primary) hypertension: Secondary | ICD-10-CM | POA: Diagnosis not present

## 2020-06-25 DIAGNOSIS — Z8701 Personal history of pneumonia (recurrent): Secondary | ICD-10-CM | POA: Diagnosis not present

## 2020-06-25 DIAGNOSIS — Z7902 Long term (current) use of antithrombotics/antiplatelets: Secondary | ICD-10-CM | POA: Diagnosis not present

## 2020-06-25 DIAGNOSIS — Z79899 Other long term (current) drug therapy: Secondary | ICD-10-CM | POA: Diagnosis not present

## 2020-06-25 DIAGNOSIS — I447 Left bundle-branch block, unspecified: Secondary | ICD-10-CM | POA: Diagnosis not present

## 2020-06-25 DIAGNOSIS — N400 Enlarged prostate without lower urinary tract symptoms: Secondary | ICD-10-CM | POA: Diagnosis not present

## 2020-06-25 DIAGNOSIS — E119 Type 2 diabetes mellitus without complications: Secondary | ICD-10-CM | POA: Diagnosis not present

## 2020-06-25 DIAGNOSIS — I25119 Atherosclerotic heart disease of native coronary artery with unspecified angina pectoris: Secondary | ICD-10-CM | POA: Diagnosis not present

## 2020-06-25 DIAGNOSIS — G4733 Obstructive sleep apnea (adult) (pediatric): Secondary | ICD-10-CM | POA: Diagnosis not present

## 2020-06-25 DIAGNOSIS — D649 Anemia, unspecified: Secondary | ICD-10-CM | POA: Diagnosis not present

## 2020-06-25 DIAGNOSIS — Z7952 Long term (current) use of systemic steroids: Secondary | ICD-10-CM | POA: Diagnosis not present

## 2020-06-25 DIAGNOSIS — K219 Gastro-esophageal reflux disease without esophagitis: Secondary | ICD-10-CM | POA: Diagnosis not present

## 2020-06-25 DIAGNOSIS — E039 Hypothyroidism, unspecified: Secondary | ICD-10-CM | POA: Diagnosis not present

## 2020-06-25 DIAGNOSIS — I214 Non-ST elevation (NSTEMI) myocardial infarction: Secondary | ICD-10-CM | POA: Diagnosis not present

## 2020-06-25 DIAGNOSIS — K649 Unspecified hemorrhoids: Secondary | ICD-10-CM | POA: Diagnosis not present

## 2020-06-25 DIAGNOSIS — E785 Hyperlipidemia, unspecified: Secondary | ICD-10-CM | POA: Diagnosis not present

## 2020-06-25 DIAGNOSIS — Z86711 Personal history of pulmonary embolism: Secondary | ICD-10-CM | POA: Diagnosis not present

## 2020-06-26 ENCOUNTER — Other Ambulatory Visit: Payer: Self-pay

## 2020-06-26 ENCOUNTER — Inpatient Hospital Stay (HOSPITAL_BASED_OUTPATIENT_CLINIC_OR_DEPARTMENT_OTHER): Payer: Medicare Other | Admitting: Hematology & Oncology

## 2020-06-26 ENCOUNTER — Inpatient Hospital Stay: Payer: Medicare Other | Attending: Hematology & Oncology

## 2020-06-26 ENCOUNTER — Encounter: Payer: Self-pay | Admitting: Hematology & Oncology

## 2020-06-26 ENCOUNTER — Inpatient Hospital Stay: Payer: Medicare Other

## 2020-06-26 VITALS — BP 122/43 | HR 68 | Temp 97.6°F | Resp 20 | Wt 210.8 lb

## 2020-06-26 DIAGNOSIS — C9000 Multiple myeloma not having achieved remission: Secondary | ICD-10-CM

## 2020-06-26 DIAGNOSIS — C9002 Multiple myeloma in relapse: Secondary | ICD-10-CM | POA: Insufficient documentation

## 2020-06-26 DIAGNOSIS — I2510 Atherosclerotic heart disease of native coronary artery without angina pectoris: Secondary | ICD-10-CM

## 2020-06-26 DIAGNOSIS — Z9861 Coronary angioplasty status: Secondary | ICD-10-CM

## 2020-06-26 DIAGNOSIS — Z5112 Encounter for antineoplastic immunotherapy: Secondary | ICD-10-CM | POA: Insufficient documentation

## 2020-06-26 LAB — CMP (CANCER CENTER ONLY)
ALT: 15 U/L (ref 0–44)
AST: 14 U/L — ABNORMAL LOW (ref 15–41)
Albumin: 4.1 g/dL (ref 3.5–5.0)
Alkaline Phosphatase: 74 U/L (ref 38–126)
Anion gap: 6 (ref 5–15)
BUN: 19 mg/dL (ref 8–23)
CO2: 28 mmol/L (ref 22–32)
Calcium: 9.4 mg/dL (ref 8.9–10.3)
Chloride: 105 mmol/L (ref 98–111)
Creatinine: 1.16 mg/dL (ref 0.61–1.24)
GFR, Estimated: >60 mL/min (ref >60)
Glucose, Bld: 108 mg/dL — ABNORMAL HIGH (ref 70–99)
Potassium: 3.9 mmol/L (ref 3.5–5.1)
Sodium: 139 mmol/L (ref 135–145)
Total Bilirubin: 0.7 mg/dL (ref 0.3–1.2)
Total Protein: 6 g/dL — ABNORMAL LOW (ref 6.5–8.1)

## 2020-06-26 LAB — CBC WITH DIFFERENTIAL (CANCER CENTER ONLY)
Abs Immature Granulocytes: 0.01 10*3/uL (ref 0.00–0.07)
Basophils Absolute: 0 10*3/uL (ref 0.0–0.1)
Basophils Relative: 0 %
Eosinophils Absolute: 0 10*3/uL (ref 0.0–0.5)
Eosinophils Relative: 1 %
HCT: 34.8 % — ABNORMAL LOW (ref 39.0–52.0)
Hemoglobin: 11.3 g/dL — ABNORMAL LOW (ref 13.0–17.0)
Immature Granulocytes: 0 %
Lymphocytes Relative: 40 %
Lymphs Abs: 2 10*3/uL (ref 0.7–4.0)
MCH: 29.4 pg (ref 26.0–34.0)
MCHC: 32.5 g/dL (ref 30.0–36.0)
MCV: 90.6 fL (ref 80.0–100.0)
Monocytes Absolute: 0.5 10*3/uL (ref 0.1–1.0)
Monocytes Relative: 10 %
Neutro Abs: 2.4 10*3/uL (ref 1.7–7.7)
Neutrophils Relative %: 49 %
Platelet Count: 111 10*3/uL — ABNORMAL LOW (ref 150–400)
RBC: 3.84 MIL/uL — ABNORMAL LOW (ref 4.22–5.81)
RDW: 15.4 % (ref 11.5–15.5)
WBC Count: 4.9 10*3/uL (ref 4.0–10.5)
nRBC: 0 % (ref 0.0–0.2)

## 2020-06-26 LAB — LACTATE DEHYDROGENASE: LDH: 136 U/L (ref 98–192)

## 2020-06-26 MED ORDER — PROCHLORPERAZINE MALEATE 10 MG PO TABS: 10.0000 mg | ORAL_TABLET | Freq: Once | ORAL | Status: DC

## 2020-06-26 MED ORDER — ACETAMINOPHEN 325 MG PO TABS: 650.0000 mg | ORAL_TABLET | Freq: Once | ORAL | Status: DC

## 2020-06-26 MED ORDER — PROCHLORPERAZINE MALEATE 10 MG PO TABS
ORAL_TABLET | ORAL | Status: AC
  Filled 2020-06-26: qty 1

## 2020-06-26 MED ORDER — DIPHENHYDRAMINE HCL 25 MG PO CAPS
ORAL_CAPSULE | ORAL | Status: AC
  Filled 2020-06-26: qty 2

## 2020-06-26 MED ORDER — DEXAMETHASONE 4 MG PO TABS: 20.0000 mg | ORAL_TABLET | Freq: Once | ORAL | Status: DC

## 2020-06-26 MED ORDER — BORTEZOMIB CHEMO SQ INJECTION 3.5 MG (2.5MG/ML)
1.3000 mg/m2 | Freq: Once | INTRAMUSCULAR | Status: AC
  Administered 2020-06-26: 2.75 mg via SUBCUTANEOUS
  Filled 2020-06-26: qty 1.1

## 2020-06-26 MED ORDER — DIPHENHYDRAMINE HCL 25 MG PO CAPS: 50.0000 mg | ORAL_CAPSULE | Freq: Once | ORAL | Status: DC

## 2020-06-26 MED ORDER — SODIUM CHLORIDE 0.9 % IV SOLN
Freq: Once | INTRAVENOUS | Status: DC
  Filled 2020-06-26: qty 250

## 2020-06-26 MED ORDER — DARATUMUMAB-HYALURONIDASE-FIHJ 1800-30000 MG-UT/15ML ~~LOC~~ SOLN
1800.0000 mg | Freq: Once | SUBCUTANEOUS | Status: AC
  Administered 2020-06-26: 1800 mg via SUBCUTANEOUS
  Filled 2020-06-26: qty 15

## 2020-06-26 MED ORDER — ACETAMINOPHEN 325 MG PO TABS
ORAL_TABLET | ORAL | Status: AC
  Filled 2020-06-26: qty 2

## 2020-06-26 NOTE — Progress Notes (Signed)
Hematology and Oncology Follow Up Visit  [name] [name] [id number] 04/19/1940 80 y.o. 06/26/2020   Principle Diagnosis:  IgG kappa myeloma - Relapsed -- 1q duplication Acute thromboembolism of the right lower leg  Past Therapy: S/P ASCT at [facility] on 04/02/2015 Patient s/p cycle 5 of Velcade/Revlimid/Decadron Revlimid 10mg  po q day (21/28) -- d/c on 09/15/2017  Current Therapy:     Faspro/Velcade/Decadron -- s/p cycle #4 - start on 03/20/2020    Zometa 4 mg IV every3 months - next dose due 07/2020 Xarelto 10 mg by mouth daily   Interim History:  [name] [name] is here today for follow-up.  So far, he is doing okay.  His cardiac status is stabilized.  He had this myocardial infarction about a month or so ago.  He is on medical therapy.  He had a cardiac cath but the LAD cannot be unblocked.  As far as the myeloma goes, everything is holding relatively stable.  His last monoclonal spike was 0.1 g/dL.  The IgG level was 560 mg/dL.  The Kappa light chain was 12.5 mg/dL.  To do a 24-hour urine on him.  He had 130 mg/day of the Kappa light chain.   He is having some more back discomfort.  It is hard to say if this is arthritis.  His last PET scan was done back in August.  I think would be worthwhile to do another PET scan on him to see how the bones look.  He has had no problems with cough or shortness of breath.  He has had no change in bowel or bladder habits.  He has had no rashes.  There is been a little bit of leg swelling.  He has gained a little bit of weight which he thinks is water weight.  Overall, his performance status is ECOG 1.       Medications:  Allergies as of 06/26/2020      Reactions   Budesonide-formoterol Fumarate Itching, Other (See Comments)   Codeine Itching   Hydrocodone-acetaminophen Itching   Mushroom Extract Complex Nausea And Vomiting, Other (See Comments)   Only shitake mushroom  causes this reaction. Flu-like symptoms from portabello mushrooms       Medication List       Accurate as of Jun 26, 2020 10:20 AM. If you have any questions, ask your nurse or doctor.        STOP taking these medications   carvedilol 3.125 MG tablet Commonly known as: COREG Stopped by: [name], MD     TAKE these medications   benzonatate 200 MG capsule Commonly known as: TESSALON Take 1 capsule (200 mg total) by mouth 3 (three) times daily as needed for cough.   cetirizine 10 MG tablet Commonly known as: EQ Allergy Relief (Cetirizine) Take 1 tablet (10 mg total) by mouth daily.   clopidogrel 75 MG tablet Commonly known as: PLAVIX Take 75 mg by mouth daily.   Cyanocobalamin 1000 MCG Tbcr Take 1 tablet by mouth daily.   dexamethasone 4 MG tablet Commonly known as: DECADRON Take 5 tablets (20 mg total) by mouth See admin instructions. Take 5 tablets once on the day of treatment.   esomeprazole 40 MG capsule Commonly known as: NEXIUM TAKE 1 CAPSULE DAILY AT 12 NOON   ezetimibe 10 MG tablet Commonly known as: Zetia Take 1 tablet (10 mg total) by mouth daily.   famciclovir 500 MG tablet Commonly known as: FAMVIR Take 1 tablet (500 mg total) by mouth daily.  fluticasone 50 MCG/ACT nasal spray Commonly known as: FLONASE Place 2 sprays into both nostrils daily.   furosemide 20 MG tablet Commonly known as: LASIX Take 20 mg by mouth daily as needed.   gabapentin 600 MG tablet Commonly known as: NEURONTIN Take 1 tablet (600 mg total) by mouth daily.   hydrocortisone 2.5 % ointment Apply topically 2 (two) times daily.   isosorbide mononitrate 30 MG 24 hr tablet Commonly known as: IMDUR Take 1 tablet (30 mg total) by mouth daily.   levothyroxine 100 MCG tablet Commonly known as: Synthroid Take 1 tablet (100 mcg total) by mouth daily before breakfast.   LORazepam 1 MG tablet Commonly known as: ATIVAN Take 1 mg by mouth at bedtime.   metoprolol succinate 25 MG 24 hr tablet Commonly known as: TOPROL-XL Take 12.5  mg by mouth daily.   nitroGLYCERIN 0.4 MG SL tablet Commonly known as: NITROSTAT Place 1 tablet (0.4 mg total) under the tongue every 5 (five) minutes as needed. For chest pain   ondansetron 8 MG tablet Commonly known as: Zofran Take 1 tablet (8 mg total) by mouth 2 (two) times daily as needed (Nausea or vomiting).   oxyCODONE-acetaminophen 10-325 MG tablet Commonly known as: PERCOCET Take 1 tablet by mouth every 8 (eight) hours as needed. for pain   potassium chloride SA 20 MEQ tablet Commonly known as: Klor-Con M20 Take 1 tablet (20 mEq total) by mouth 2 (two) times daily.   prochlorperazine 10 MG tablet Commonly known as: COMPAZINE Take 1 tablet (10 mg total) by mouth every 6 (six) hours as needed (Nausea or vomiting).   pyridoxine 100 MG tablet Commonly known as: B-6 Take 100 mg by mouth daily.   rivaroxaban 10 MG Tabs tablet Commonly known as: XARELTO Take 1 tablet (10 mg total) by mouth daily with supper.   rosuvastatin 40 MG tablet Commonly known as: CRESTOR Take 40 mg by mouth at bedtime.   sacubitril-valsartan 24-26 MG Commonly known as: ENTRESTO Take 1 tablet by mouth in the morning and at bedtime.   tamsulosin 0.4 MG Caps capsule Commonly known as: FLOMAX Take 1 capsule (0.4 mg total) by mouth daily after supper.       Allergies:  Allergies  Allergen Reactions  . Budesonide-Formoterol Fumarate Itching and Other (See Comments)  . Codeine Itching  . Hydrocodone-Acetaminophen Itching  . Mushroom Extract Complex Nausea And Vomiting and Other (See Comments)    Only shitake mushroom  causes this reaction. Flu-like symptoms from portabello mushrooms    Past Medical History, Surgical history, Social history, and Family History were reviewed and updated.  Review of Systems: Review of Systems  HENT: Negative.   Eyes: Negative.   Respiratory: Negative.   Cardiovascular: Positive for chest pain.  Gastrointestinal: Negative.   Genitourinary: Negative.    Musculoskeletal: Positive for back pain.  Skin: Negative.   Neurological: Negative.   Endo/Heme/Allergies: Negative.   Psychiatric/Behavioral: Negative.      Physical Exam:  weight is 210 lb 12.8 oz (95.6 kg). His oral temperature is 97.6 F (36.4 C). His blood pressure is 122/43 (abnormal) and his pulse is 68. His respiration is 20 and oxygen saturation is 98%.   Wt Readings from Last 3 Encounters:  06/26/20 210 lb 12.8 oz (95.6 kg)  06/08/20 203 lb (92.1 kg)  06/05/20 202 lb (91.6 kg)    Physical Exam Vitals reviewed.  HENT:     Head: Normocephalic and atraumatic.  Eyes:     Pupils: Pupils are equal,  round, and reactive to light.  Cardiovascular:     Rate and Rhythm: Normal rate and regular rhythm.     Heart sounds: Normal heart sounds.  Pulmonary:     Effort: Pulmonary effort is normal.     Breath sounds: Normal breath sounds.  Abdominal:     General: Bowel sounds are normal.     Palpations: Abdomen is soft.  Musculoskeletal:        General: No tenderness or deformity. Normal range of motion.     Cervical back: Normal range of motion.  Lymphadenopathy:     Cervical: No cervical adenopathy.  Skin:    General: Skin is warm and dry.     Findings: No erythema or rash.  Neurological:     Mental Status: He is alert and oriented to person, place, and time.  Psychiatric:        Behavior: Behavior normal.        Thought Content: Thought content normal.        Judgment: Judgment normal.      Lab Results  Component Value Date   WBC 4.9 06/26/2020   HGB 11.3 (L) 06/26/2020   HCT 34.8 (L) 06/26/2020   MCV 90.6 06/26/2020   PLT 111 (L) 06/26/2020   Lab Results  Component Value Date   FERRITIN 199 07/18/2019   IRON 65 07/18/2019   TIBC 316 07/18/2019   UIBC 251 07/18/2019   IRONPCTSAT 21 07/18/2019   Lab Results  Component Value Date   RETICCTPCT 1.7 07/18/2019   RBC 3.84 (L) 06/26/2020   Lab Results  Component Value Date   KPAFRELGTCHN 129.5 (H)  06/05/2020   LAMBDASER 3.9 (L) 06/05/2020   KAPLAMBRATIO 14.73 (H) 06/08/2020   Lab Results  Component Value Date   IGGSERUM 553 (L) 06/05/2020   IGGSERUM 576 (L) 06/05/2020   IGA 22 (L) 06/05/2020   IGA 25 (L) 06/05/2020   IGMSERUM 20 06/05/2020   IGMSERUM 23 06/05/2020   Lab Results  Component Value Date   TOTALPROTELP 6.1 06/05/2020   ALBUMINELP 3.6 06/05/2020   A1GS 0.3 06/05/2020   A2GS 0.9 06/05/2020   BETS 1.0 06/05/2020   BETA2SER 0.3 02/11/2015   GAMS 0.4 06/05/2020   MSPIKE 0.1 (H) 06/05/2020   SPEI Comment 10/30/2019     Chemistry      Component Value Date/Time   NA 139 06/26/2020 0925   NA 145 (H) 06/28/2019 1501   NA 140 01/27/2017 1044   NA 141 06/05/2015 1036   K 3.9 06/26/2020 0925   K 3.3 01/27/2017 1044   K 2.9 (LL) 06/05/2015 1036   CL 105 06/26/2020 0925   CL 101 01/27/2017 1044   CO2 28 06/26/2020 0925   CO2 28 01/27/2017 1044   CO2 27 06/05/2015 1036   BUN 19 06/26/2020 0925   BUN 15 06/28/2019 1501   BUN 14 01/27/2017 1044   BUN 13.1 06/05/2015 1036   CREATININE 1.16 06/26/2020 0925   CREATININE 1.16 10/08/2018 1531   CREATININE 1.0 06/05/2015 1036   GLU 152 03/30/2016 0000      Component Value Date/Time   CALCIUM 9.4 06/26/2020 0925   CALCIUM 8.9 01/27/2017 1044   CALCIUM 9.4 06/05/2015 1036   ALKPHOS 74 06/26/2020 0925   ALKPHOS 126 (H) 01/27/2017 1044   ALKPHOS 94 06/05/2015 1036   AST 14 (L) 06/26/2020 0925   AST 22 06/05/2015 1036   ALT 15 06/26/2020 0925   ALT 33 01/27/2017 1044   ALT 18  06/05/2015 1036   BILITOT 0.7 06/26/2020 0925   BILITOT 0.59 06/05/2015 1036       Impression and Plan: [name] [name] is a very pleasant 80 yo caucasian gentleman with history of IgG kappa myeloma.  He underwent standard induction chemotherapy and then ultimately stem cell transplant at [facility] in February 2017.   I would like to think that he is responding to treatment.  I know that the light chains we have to follow.  We will see what  the PET scan has to show.  I am happy to hear and see that his cardiac status is more stable.  We will go ahead with this fifth cycle of treatment.  We will plan to get him back myself in another month.    [name], MD 5/13/202210:20 AM

## 2020-06-26 NOTE — Patient Instructions (Signed)
[facility] [facility]  Discharge Instructions: Thank you for choosing [facility] to provide your oncology and hematology care.   If you have a lab appointment with the [facility], please go directly to the [facility] and check in at the registration area.  Wear comfortable clothing and clothing appropriate for easy access to any Portacath or PICC line.   We strive to give you quality time with your provider. You may need to reschedule your appointment if you arrive late (15 or more minutes).  Arriving late affects you and other patients whose appointments are after yours.  Also, if you miss three or more appointments without notifying the office, you may be dismissed from the clinic at the provider's discretion.      For prescription refill requests, have your pharmacy contact our office and allow 72 hours for refills to be completed.    Today you received the following chemotherapy and/or immunotherapy agents velcade, fastpro      To help prevent nausea and vomiting after your treatment, we encourage you to take your nausea medication as directed.  BELOW ARE SYMPTOMS THAT SHOULD BE REPORTED IMMEDIATELY: . *FEVER GREATER THAN 100.4 F (38 C) OR HIGHER . *CHILLS OR SWEATING . *NAUSEA AND VOMITING THAT IS NOT CONTROLLED WITH YOUR NAUSEA MEDICATION . *UNUSUAL SHORTNESS OF BREATH . *UNUSUAL BRUISING OR BLEEDING . *URINARY PROBLEMS (pain or burning when urinating, or frequent urination) . *BOWEL PROBLEMS (unusual diarrhea, constipation, pain near the anus) . TENDERNESS IN MOUTH AND THROAT WITH OR WITHOUT PRESENCE OF ULCERS (sore throat, sores in mouth, or a toothache) . UNUSUAL RASH, SWELLING OR PAIN  . UNUSUAL VAGINAL DISCHARGE OR ITCHING   Items with * indicate a potential emergency and should be followed up as soon as possible or go to the Emergency Department if any problems should occur.  Please show the CHEMOTHERAPY ALERT CARD or IMMUNOTHERAPY  ALERT CARD at check-in to the Emergency Department and triage nurse. Should you have questions after your visit or need to cancel or reschedule your appointment, please contact [facility]  [phone] and follow the prompts.  Office hours are 8:00 a.m. to 4:30 p.m. Monday - Friday. Please note that voicemails left after 4:00 p.m. may not be returned until the following business day.  We are closed weekends and major holidays. You have access to a nurse at all times for urgent questions. Please call the main number to the clinic [phone] and follow the prompts.  For any non-urgent questions, you may also contact your provider using MyChart. We now offer e-Visits for anyone 60 and older to request care online for non-urgent symptoms. For details visit [email or web address].   Also download the MyChart app! Go to the app store, search "MyChart", open the app, select [facility], and log in with your MyChart username and password.  Due to Covid, a mask is required upon entering the hospital/clinic. If you do not have a mask, one will be given to you upon arrival. For doctor visits, patients may have 1 support person aged 89 or older with them. For treatment visits, patients cannot have anyone with them due to current Covid guidelines and our immunocompromised population.

## 2020-06-27 LAB — IGG, IGA, IGM
IgA: 16 mg/dL — ABNORMAL LOW (ref 61–437)
IgG (Immunoglobin G), Serum: 499 mg/dL — ABNORMAL LOW (ref 603–1613)
IgM (Immunoglobulin M), Srm: 18 mg/dL (ref 15–143)

## 2020-06-29 DIAGNOSIS — J439 Emphysema, unspecified: Secondary | ICD-10-CM | POA: Diagnosis not present

## 2020-06-29 DIAGNOSIS — I214 Non-ST elevation (NSTEMI) myocardial infarction: Secondary | ICD-10-CM | POA: Diagnosis not present

## 2020-06-29 DIAGNOSIS — I255 Ischemic cardiomyopathy: Secondary | ICD-10-CM | POA: Diagnosis not present

## 2020-06-29 DIAGNOSIS — I25119 Atherosclerotic heart disease of native coronary artery with unspecified angina pectoris: Secondary | ICD-10-CM | POA: Diagnosis not present

## 2020-06-29 DIAGNOSIS — E119 Type 2 diabetes mellitus without complications: Secondary | ICD-10-CM | POA: Diagnosis not present

## 2020-06-29 DIAGNOSIS — I10 Essential (primary) hypertension: Secondary | ICD-10-CM | POA: Diagnosis not present

## 2020-06-29 LAB — KAPPA/LAMBDA LIGHT CHAINS
Kappa free light chain: 106.6 mg/L — ABNORMAL HIGH (ref 3.3–19.4)
Kappa, lambda light chain ratio: 39.48 — ABNORMAL HIGH (ref 0.26–1.65)
Lambda free light chains: 2.7 mg/L — ABNORMAL LOW (ref 5.7–26.3)

## 2020-06-30 ENCOUNTER — Encounter: Payer: Self-pay | Admitting: *Deleted

## 2020-06-30 LAB — PROTEIN ELECTROPHORESIS, SERUM, WITH REFLEX
A/G Ratio: 1.4 (ref 0.7–1.7)
Albumin ELP: 3.3 g/dL (ref 2.9–4.4)
Alpha-1-Globulin: 0.3 g/dL (ref 0.0–0.4)
Alpha-2-Globulin: 0.8 g/dL (ref 0.4–1.0)
Beta Globulin: 0.9 g/dL (ref 0.7–1.3)
Gamma Globulin: 0.4 g/dL (ref 0.4–1.8)
Globulin, Total: 2.4 g/dL (ref 2.2–3.9)
M-Spike, %: 0.2 g/dL — ABNORMAL HIGH
SPEP Interpretation: 0
Total Protein ELP: 5.7 g/dL — ABNORMAL LOW (ref 6.0–8.5)

## 2020-06-30 LAB — IMMUNOFIXATION REFLEX, SERUM
IgA: 15 mg/dL — ABNORMAL LOW (ref 61–437)
IgG (Immunoglobin G), Serum: 578 mg/dL — ABNORMAL LOW (ref 603–1613)
IgM (Immunoglobulin M), Srm: 21 mg/dL (ref 15–143)

## 2020-07-01 DIAGNOSIS — I255 Ischemic cardiomyopathy: Secondary | ICD-10-CM | POA: Diagnosis not present

## 2020-07-01 DIAGNOSIS — Z955 Presence of coronary angioplasty implant and graft: Secondary | ICD-10-CM | POA: Diagnosis not present

## 2020-07-01 DIAGNOSIS — I2510 Atherosclerotic heart disease of native coronary artery without angina pectoris: Secondary | ICD-10-CM | POA: Diagnosis not present

## 2020-07-01 DIAGNOSIS — L814 Other melanin hyperpigmentation: Secondary | ICD-10-CM | POA: Diagnosis not present

## 2020-07-03 ENCOUNTER — Other Ambulatory Visit: Payer: Self-pay | Admitting: Sports Medicine

## 2020-07-03 DIAGNOSIS — J439 Emphysema, unspecified: Secondary | ICD-10-CM | POA: Diagnosis not present

## 2020-07-03 DIAGNOSIS — I255 Ischemic cardiomyopathy: Secondary | ICD-10-CM | POA: Diagnosis not present

## 2020-07-03 DIAGNOSIS — I10 Essential (primary) hypertension: Secondary | ICD-10-CM | POA: Diagnosis not present

## 2020-07-03 DIAGNOSIS — I25119 Atherosclerotic heart disease of native coronary artery with unspecified angina pectoris: Secondary | ICD-10-CM | POA: Diagnosis not present

## 2020-07-03 DIAGNOSIS — E119 Type 2 diabetes mellitus without complications: Secondary | ICD-10-CM | POA: Diagnosis not present

## 2020-07-03 DIAGNOSIS — I214 Non-ST elevation (NSTEMI) myocardial infarction: Secondary | ICD-10-CM | POA: Diagnosis not present

## 2020-07-03 MED ORDER — ESOMEPRAZOLE MAGNESIUM 40 MG PO CPDR: 40.0000 mg | DELAYED_RELEASE_CAPSULE | Freq: Every day | ORAL | 3 refills | Status: DC

## 2020-07-06 DIAGNOSIS — I2510 Atherosclerotic heart disease of native coronary artery without angina pectoris: Secondary | ICD-10-CM | POA: Diagnosis not present

## 2020-07-06 DIAGNOSIS — Z955 Presence of coronary angioplasty implant and graft: Secondary | ICD-10-CM | POA: Diagnosis not present

## 2020-07-06 DIAGNOSIS — I255 Ischemic cardiomyopathy: Secondary | ICD-10-CM | POA: Diagnosis not present

## 2020-07-07 ENCOUNTER — Other Ambulatory Visit: Payer: Self-pay | Admitting: Sports Medicine

## 2020-07-07 DIAGNOSIS — I25119 Atherosclerotic heart disease of native coronary artery with unspecified angina pectoris: Secondary | ICD-10-CM | POA: Diagnosis not present

## 2020-07-07 DIAGNOSIS — I255 Ischemic cardiomyopathy: Secondary | ICD-10-CM | POA: Diagnosis not present

## 2020-07-07 DIAGNOSIS — I214 Non-ST elevation (NSTEMI) myocardial infarction: Secondary | ICD-10-CM | POA: Diagnosis not present

## 2020-07-07 DIAGNOSIS — E119 Type 2 diabetes mellitus without complications: Secondary | ICD-10-CM | POA: Diagnosis not present

## 2020-07-07 DIAGNOSIS — I10 Essential (primary) hypertension: Secondary | ICD-10-CM | POA: Diagnosis not present

## 2020-07-07 DIAGNOSIS — J439 Emphysema, unspecified: Secondary | ICD-10-CM | POA: Diagnosis not present

## 2020-07-08 ENCOUNTER — Ambulatory Visit (INDEPENDENT_AMBULATORY_CARE_PROVIDER_SITE_OTHER): Payer: Medicare Other | Admitting: Sports Medicine

## 2020-07-08 ENCOUNTER — Other Ambulatory Visit: Payer: Self-pay

## 2020-07-08 ENCOUNTER — Encounter: Payer: Self-pay | Admitting: Sports Medicine

## 2020-07-08 ENCOUNTER — Other Ambulatory Visit: Payer: Self-pay | Admitting: Family

## 2020-07-08 DIAGNOSIS — E7800 Pure hypercholesterolemia, unspecified: Secondary | ICD-10-CM

## 2020-07-08 DIAGNOSIS — C9000 Multiple myeloma not having achieved remission: Secondary | ICD-10-CM

## 2020-07-08 DIAGNOSIS — G4700 Insomnia, unspecified: Secondary | ICD-10-CM | POA: Diagnosis not present

## 2020-07-08 DIAGNOSIS — K5903 Drug induced constipation: Secondary | ICD-10-CM | POA: Diagnosis not present

## 2020-07-08 DIAGNOSIS — Z9861 Coronary angioplasty status: Secondary | ICD-10-CM | POA: Diagnosis not present

## 2020-07-08 DIAGNOSIS — I2510 Atherosclerotic heart disease of native coronary artery without angina pectoris: Secondary | ICD-10-CM

## 2020-07-08 DIAGNOSIS — T402X5A Adverse effect of other opioids, initial encounter: Secondary | ICD-10-CM | POA: Diagnosis not present

## 2020-07-08 DIAGNOSIS — I10 Essential (primary) hypertension: Secondary | ICD-10-CM

## 2020-07-08 MED ORDER — METOPROLOL SUCCINATE ER 25 MG PO TB24: 12.5000 mg | ORAL_TABLET | Freq: Every day | ORAL | 3 refills | Status: DC

## 2020-07-08 NOTE — Assessment & Plan Note (Signed)
We have been working hard to keep [name] [name]'s blood pressure high enough, his cardiac medications have created hypotension and presyncope. More recently we had discontinued his Lasix, he gained some weight so he takes it now but only as needed, his carvedilol was discontinued and he was switched to Toprol-XL 1/2 pill daily, this is over $300 so I did advise him we would be sending this to [facility] with a good Rx coupon. His Imdur was discontinued as is appropriate. He has not had any additional angina off of Imdur. I do believe [name] was increased as well, he does have mild heart failure.  Right now his blood pressure is acceptable.

## 2020-07-08 NOTE — Assessment & Plan Note (Signed)
[name] [name] does continue to have widespread aches and pains, arms, hips, legs, back. He is on relatively high-dose rosuvastatin, this could certainly be at play, he will think about discontinuing rosuvastatin, we can certainly switch him to [place] if needed for lipid control, if he does decide to stop it I have asked him to stop it for 2 weeks to see if his myalgias improve and if they do not improve he would restart.

## 2020-07-08 NOTE — Assessment & Plan Note (Signed)
[name] [name] initially did well with lorazepam, he is developing tachyphylaxis and has had to use more and more, on further questioning he does use his CPAP acceptably, he wakes up with pain in his shoulder, he does have some pain medication and notes that when he takes it he will sleep more acceptably, he does have some daytime sleepiness. I have advised him to hold off on the lorazepam and use his pain medication at night, I am happy to refill this as needed, and if this keeps him sleep we will not need to make any other changes.

## 2020-07-08 NOTE — Assessment & Plan Note (Signed)
Adequately controlled with occasional Amitiza.

## 2020-07-08 NOTE — Progress Notes (Signed)
    Procedures performed today:    None.  Independent interpretation of notes and tests performed by another provider:   None.  Brief History, Exam, Impression, and Recommendations:    Essential hypertension, benign We have been working hard to keep [name] [name]'s blood pressure high enough, his cardiac medications have created hypotension and presyncope. More recently we had discontinued his Lasix, he gained some weight so he takes it now but only as needed, his carvedilol was discontinued and he was switched to Toprol-XL 1/2 pill daily, this is over $300 so I did advise him we would be sending this to [facility] with a good Rx coupon. His Imdur was discontinued as is appropriate. He has not had any additional angina off of Imdur. I do believe his Delene Loll was increased as well, he does have mild heart failure.  Right now his blood pressure is acceptable.  Insomnia [name] [name] initially did well with lorazepam, he is developing tachyphylaxis and has had to use more and more, on further questioning he does use his CPAP acceptably, he wakes up with pain in his shoulder, he does have some pain medication and notes that when he takes it he will sleep more acceptably, he does have some daytime sleepiness. I have advised him to hold off on the lorazepam and use his pain medication at night, I am happy to refill this as needed, and if this keeps him sleep we will not need to make any other changes.  Therapeutic opioid induced constipation Adequately controlled with occasional Amitiza.  Hyperlipidemia [name] [name] does continue to have widespread aches and pains, arms, hips, legs, back. He is on relatively high-dose rosuvastatin, this could certainly be at play, he will think about discontinuing rosuvastatin, we can certainly switch him to [place] if needed for lipid control, if he does decide to stop it I have asked him to stop it for 2 weeks to see if his myalgias improve and if they do not improve he would  restart.    ___________________________________________ [name] [name]. [name] [name], M.D., ABFM., CAQSM. Primary Care and [place] [employer] [facility] [employer]

## 2020-07-09 ENCOUNTER — Telehealth: Payer: Self-pay

## 2020-07-09 ENCOUNTER — Ambulatory Visit (HOSPITAL_COMMUNITY)
Admission: RE | Admit: 2020-07-09 | Discharge: 2020-07-09 | Disposition: A | Discharge status: Home / Self Care | Payer: Medicare Other | Source: Ambulatory Visit | Attending: Hematology & Oncology | Admitting: Hematology & Oncology

## 2020-07-09 ENCOUNTER — Other Ambulatory Visit: Payer: Self-pay

## 2020-07-09 DIAGNOSIS — Z79899 Other long term (current) drug therapy: Secondary | ICD-10-CM | POA: Diagnosis not present

## 2020-07-09 DIAGNOSIS — C9000 Multiple myeloma not having achieved remission: Secondary | ICD-10-CM | POA: Insufficient documentation

## 2020-07-09 LAB — GLUCOSE, CAPILLARY: Glucose-Capillary: 97 mg/dL (ref 70–99)

## 2020-07-09 MED ORDER — FLUDEOXYGLUCOSE F - 18 (FDG) INJECTION
9.0000 | Freq: Once | INTRAVENOUS | Status: AC | PRN
  Administered 2020-07-09: 10.08 via INTRAVENOUS

## 2020-07-09 NOTE — Telephone Encounter (Signed)
Melissa with [name] [name] called to report that she would be missing visits with patient today due to testing and would also not be able to see patient tomorrow due to him having chemo.

## 2020-07-10 ENCOUNTER — Inpatient Hospital Stay: Payer: Medicare Other

## 2020-07-10 VITALS — BP 121/47 | HR 60 | Temp 97.8°F | Resp 20

## 2020-07-10 DIAGNOSIS — C9000 Multiple myeloma not having achieved remission: Secondary | ICD-10-CM

## 2020-07-10 DIAGNOSIS — C9002 Multiple myeloma in relapse: Secondary | ICD-10-CM | POA: Diagnosis not present

## 2020-07-10 DIAGNOSIS — Z5112 Encounter for antineoplastic immunotherapy: Secondary | ICD-10-CM | POA: Diagnosis not present

## 2020-07-10 LAB — CBC WITH DIFFERENTIAL/PLATELET
Abs Immature Granulocytes: 0.01 10*3/uL (ref 0.00–0.07)
Basophils Absolute: 0 10*3/uL (ref 0.0–0.1)
Basophils Relative: 1 %
Eosinophils Absolute: 0 10*3/uL (ref 0.0–0.5)
Eosinophils Relative: 1 %
HCT: 37 % — ABNORMAL LOW (ref 39.0–52.0)
Hemoglobin: 12.1 g/dL — ABNORMAL LOW (ref 13.0–17.0)
Immature Granulocytes: 0 %
Lymphocytes Relative: 45 %
Lymphs Abs: 2.1 10*3/uL (ref 0.7–4.0)
MCH: 29.9 pg (ref 26.0–34.0)
MCHC: 32.7 g/dL (ref 30.0–36.0)
MCV: 91.4 fL (ref 80.0–100.0)
Monocytes Absolute: 0.5 10*3/uL (ref 0.1–1.0)
Monocytes Relative: 10 %
Neutro Abs: 2 10*3/uL (ref 1.7–7.7)
Neutrophils Relative %: 43 %
Platelets: 149 10*3/uL — ABNORMAL LOW (ref 150–400)
RBC: 4.05 MIL/uL — ABNORMAL LOW (ref 4.22–5.81)
RDW: 14.8 % (ref 11.5–15.5)
WBC: 4.7 10*3/uL (ref 4.0–10.5)
nRBC: 0 % (ref 0.0–0.2)

## 2020-07-10 LAB — COMPREHENSIVE METABOLIC PANEL
ALT: 16 U/L (ref 0–44)
AST: 16 U/L (ref 15–41)
Albumin: 4.2 g/dL (ref 3.5–5.0)
Alkaline Phosphatase: 99 U/L (ref 38–126)
Anion gap: 7 (ref 5–15)
BUN: 18 mg/dL (ref 8–23)
CO2: 31 mmol/L (ref 22–32)
Calcium: 9.5 mg/dL (ref 8.9–10.3)
Chloride: 104 mmol/L (ref 98–111)
Creatinine, Ser: 1.32 mg/dL — ABNORMAL HIGH (ref 0.61–1.24)
GFR, Estimated: 55 mL/min — ABNORMAL LOW (ref >60)
Glucose, Bld: 91 mg/dL (ref 70–99)
Potassium: 3.7 mmol/L (ref 3.5–5.1)
Sodium: 142 mmol/L (ref 135–145)
Total Bilirubin: 0.5 mg/dL (ref 0.3–1.2)
Total Protein: 6.2 g/dL — ABNORMAL LOW (ref 6.5–8.1)

## 2020-07-10 MED ORDER — BORTEZOMIB CHEMO SQ INJECTION 3.5 MG (2.5MG/ML)
1.3000 mg/m2 | Freq: Once | INTRAMUSCULAR | Status: AC
  Administered 2020-07-10: 2.75 mg via SUBCUTANEOUS
  Filled 2020-07-10: qty 1.1

## 2020-07-10 MED ORDER — PROCHLORPERAZINE MALEATE 10 MG PO TABS: 10.0000 mg | ORAL_TABLET | Freq: Once | ORAL | Status: DC

## 2020-07-10 MED ORDER — DARATUMUMAB-HYALURONIDASE-FIHJ 1800-30000 MG-UT/15ML ~~LOC~~ SOLN
1800.0000 mg | Freq: Once | SUBCUTANEOUS | Status: AC
  Administered 2020-07-10: 1800 mg via SUBCUTANEOUS
  Filled 2020-07-10: qty 15

## 2020-07-10 NOTE — Patient Instructions (Signed)
[facility] [place]  Discharge Instructions: Thank you for choosing [facility] to provide your oncology and hematology care.   If you have a lab appointment with the [facility], please go directly to the [facility] and check in at the registration area.  Wear comfortable clothing and clothing appropriate for easy access to any Portacath or PICC line.   We strive to give you quality time with your provider. You may need to reschedule your appointment if you arrive late (15 or more minutes).  Arriving late affects you and other patients whose appointments are after yours.  Also, if you miss three or more appointments without notifying the office, you may be dismissed from the clinic at the provider's discretion.      For prescription refill requests, have your pharmacy contact our office and allow 72 hours for refills to be completed.    Today you received the following chemotherapy and/or immunotherapy agents velcade fastpro   To help prevent nausea and vomiting after your treatment, we encourage you to take your nausea medication as directed.  BELOW ARE SYMPTOMS THAT SHOULD BE REPORTED IMMEDIATELY: . *FEVER GREATER THAN 100.4 F (38 C) OR HIGHER . *CHILLS OR SWEATING . *NAUSEA AND VOMITING THAT IS NOT CONTROLLED WITH YOUR NAUSEA MEDICATION . *UNUSUAL SHORTNESS OF BREATH . *UNUSUAL BRUISING OR BLEEDING . *URINARY PROBLEMS (pain or burning when urinating, or frequent urination) . *BOWEL PROBLEMS (unusual diarrhea, constipation, pain near the anus) . TENDERNESS IN MOUTH AND THROAT WITH OR WITHOUT PRESENCE OF ULCERS (sore throat, sores in mouth, or a toothache) . UNUSUAL RASH, SWELLING OR PAIN  . UNUSUAL VAGINAL DISCHARGE OR ITCHING   Items with * indicate a potential emergency and should be followed up as soon as possible or go to the Emergency Department if any problems should occur.  Please show the CHEMOTHERAPY ALERT CARD or IMMUNOTHERAPY ALERT  CARD at check-in to the Emergency Department and triage nurse. Should you have questions after your visit or need to cancel or reschedule your appointment, please contact [facility]  [phone] and follow the prompts.  Office hours are 8:00 a.m. to 4:30 p.m. Monday - Friday. Please note that voicemails left after 4:00 p.m. may not be returned until the following business day.  We are closed weekends and major holidays. You have access to a nurse at all times for urgent questions. Please call the main number to the clinic [phone] and follow the prompts.  For any non-urgent questions, you may also contact your provider using MyChart. We now offer e-Visits for anyone 52 and older to request care online for non-urgent symptoms. For details visit [email or web address].   Also download the MyChart app! Go to the app store, search "MyChart", open the app, select [facility], and log in with your MyChart username and password.  Due to Covid, a mask is required upon entering the hospital/clinic. If you do not have a mask, one will be given to you upon arrival. For doctor visits, patients may have 1 support person aged 35 or older with them. For treatment visits, patients cannot have anyone with them due to current Covid guidelines and our immunocompromised population.

## 2020-07-14 ENCOUNTER — Telehealth: Payer: Self-pay | Admitting: *Deleted

## 2020-07-14 NOTE — Telephone Encounter (Signed)
-----   Message from [name], MD sent at 07/13/2020  7:19 AM EDT ----- Call - NO active myeloma in the bones!!!   [name] [name]

## 2020-07-14 NOTE — Telephone Encounter (Signed)
Patient notified per order of Dr. Marin Olp that there is "NO active myeloma in the bones!!! Pete"  Pt appreciative of call and has no questions or concerns at this time.

## 2020-07-15 DIAGNOSIS — I255 Ischemic cardiomyopathy: Secondary | ICD-10-CM | POA: Diagnosis not present

## 2020-07-15 DIAGNOSIS — I25119 Atherosclerotic heart disease of native coronary artery with unspecified angina pectoris: Secondary | ICD-10-CM | POA: Diagnosis not present

## 2020-07-15 DIAGNOSIS — I10 Essential (primary) hypertension: Secondary | ICD-10-CM | POA: Diagnosis not present

## 2020-07-15 DIAGNOSIS — E119 Type 2 diabetes mellitus without complications: Secondary | ICD-10-CM | POA: Diagnosis not present

## 2020-07-15 DIAGNOSIS — J439 Emphysema, unspecified: Secondary | ICD-10-CM | POA: Diagnosis not present

## 2020-07-15 DIAGNOSIS — I214 Non-ST elevation (NSTEMI) myocardial infarction: Secondary | ICD-10-CM | POA: Diagnosis not present

## 2020-07-17 DIAGNOSIS — I255 Ischemic cardiomyopathy: Secondary | ICD-10-CM | POA: Diagnosis not present

## 2020-07-17 DIAGNOSIS — J439 Emphysema, unspecified: Secondary | ICD-10-CM | POA: Diagnosis not present

## 2020-07-17 DIAGNOSIS — I214 Non-ST elevation (NSTEMI) myocardial infarction: Secondary | ICD-10-CM | POA: Diagnosis not present

## 2020-07-17 DIAGNOSIS — E119 Type 2 diabetes mellitus without complications: Secondary | ICD-10-CM | POA: Diagnosis not present

## 2020-07-17 DIAGNOSIS — I10 Essential (primary) hypertension: Secondary | ICD-10-CM | POA: Diagnosis not present

## 2020-07-17 DIAGNOSIS — I25119 Atherosclerotic heart disease of native coronary artery with unspecified angina pectoris: Secondary | ICD-10-CM | POA: Diagnosis not present

## 2020-07-20 DIAGNOSIS — E119 Type 2 diabetes mellitus without complications: Secondary | ICD-10-CM | POA: Diagnosis not present

## 2020-07-20 DIAGNOSIS — I25119 Atherosclerotic heart disease of native coronary artery with unspecified angina pectoris: Secondary | ICD-10-CM | POA: Diagnosis not present

## 2020-07-20 DIAGNOSIS — I214 Non-ST elevation (NSTEMI) myocardial infarction: Secondary | ICD-10-CM | POA: Diagnosis not present

## 2020-07-20 DIAGNOSIS — J439 Emphysema, unspecified: Secondary | ICD-10-CM | POA: Diagnosis not present

## 2020-07-20 DIAGNOSIS — I10 Essential (primary) hypertension: Secondary | ICD-10-CM | POA: Diagnosis not present

## 2020-07-20 DIAGNOSIS — I255 Ischemic cardiomyopathy: Secondary | ICD-10-CM | POA: Diagnosis not present

## 2020-07-22 DIAGNOSIS — J439 Emphysema, unspecified: Secondary | ICD-10-CM | POA: Diagnosis not present

## 2020-07-22 DIAGNOSIS — I255 Ischemic cardiomyopathy: Secondary | ICD-10-CM | POA: Diagnosis not present

## 2020-07-22 DIAGNOSIS — I214 Non-ST elevation (NSTEMI) myocardial infarction: Secondary | ICD-10-CM | POA: Diagnosis not present

## 2020-07-22 DIAGNOSIS — I25119 Atherosclerotic heart disease of native coronary artery with unspecified angina pectoris: Secondary | ICD-10-CM | POA: Diagnosis not present

## 2020-07-22 DIAGNOSIS — E119 Type 2 diabetes mellitus without complications: Secondary | ICD-10-CM | POA: Diagnosis not present

## 2020-07-22 DIAGNOSIS — I10 Essential (primary) hypertension: Secondary | ICD-10-CM | POA: Diagnosis not present

## 2020-07-24 ENCOUNTER — Inpatient Hospital Stay: Payer: Medicare Other | Attending: Hematology & Oncology

## 2020-07-24 ENCOUNTER — Telehealth: Payer: Self-pay

## 2020-07-24 ENCOUNTER — Inpatient Hospital Stay: Payer: Medicare Other

## 2020-07-24 ENCOUNTER — Inpatient Hospital Stay (HOSPITAL_BASED_OUTPATIENT_CLINIC_OR_DEPARTMENT_OTHER): Payer: Medicare Other | Admitting: Hematology & Oncology

## 2020-07-24 ENCOUNTER — Encounter: Payer: Self-pay | Admitting: Hematology & Oncology

## 2020-07-24 ENCOUNTER — Other Ambulatory Visit: Payer: Self-pay

## 2020-07-24 VITALS — BP 126/51 | HR 66 | Temp 97.9°F | Resp 18 | Wt 212.0 lb

## 2020-07-24 DIAGNOSIS — C9002 Multiple myeloma in relapse: Secondary | ICD-10-CM | POA: Diagnosis not present

## 2020-07-24 DIAGNOSIS — Z5112 Encounter for antineoplastic immunotherapy: Secondary | ICD-10-CM | POA: Diagnosis not present

## 2020-07-24 DIAGNOSIS — C9000 Multiple myeloma not having achieved remission: Secondary | ICD-10-CM

## 2020-07-24 DIAGNOSIS — Z86718 Personal history of other venous thrombosis and embolism: Secondary | ICD-10-CM | POA: Insufficient documentation

## 2020-07-24 DIAGNOSIS — Z7901 Long term (current) use of anticoagulants: Secondary | ICD-10-CM | POA: Diagnosis not present

## 2020-07-24 LAB — CMP (CANCER CENTER ONLY)
ALT: 14 U/L (ref 0–44)
AST: 14 U/L — ABNORMAL LOW (ref 15–41)
Albumin: 4.1 g/dL (ref 3.5–5.0)
Alkaline Phosphatase: 72 U/L (ref 38–126)
Anion gap: 8 (ref 5–15)
BUN: 18 mg/dL (ref 8–23)
CO2: 28 mmol/L (ref 22–32)
Calcium: 9.4 mg/dL (ref 8.9–10.3)
Chloride: 101 mmol/L (ref 98–111)
Creatinine: 1.28 mg/dL — ABNORMAL HIGH (ref 0.61–1.24)
GFR, Estimated: 57 mL/min — ABNORMAL LOW (ref >60)
Glucose, Bld: 90 mg/dL (ref 70–99)
Potassium: 3.4 mmol/L — ABNORMAL LOW (ref 3.5–5.1)
Sodium: 137 mmol/L (ref 135–145)
Total Bilirubin: 0.7 mg/dL (ref 0.3–1.2)
Total Protein: 6 g/dL — ABNORMAL LOW (ref 6.5–8.1)

## 2020-07-24 LAB — CBC WITH DIFFERENTIAL (CANCER CENTER ONLY)
Abs Immature Granulocytes: 0.01 10*3/uL (ref 0.00–0.07)
Basophils Absolute: 0 10*3/uL (ref 0.0–0.1)
Basophils Relative: 0 %
Eosinophils Absolute: 0 10*3/uL (ref 0.0–0.5)
Eosinophils Relative: 1 %
HCT: 36.8 % — ABNORMAL LOW (ref 39.0–52.0)
Hemoglobin: 12.2 g/dL — ABNORMAL LOW (ref 13.0–17.0)
Immature Granulocytes: 0 %
Lymphocytes Relative: 40 %
Lymphs Abs: 2.1 10*3/uL (ref 0.7–4.0)
MCH: 29.8 pg (ref 26.0–34.0)
MCHC: 33.2 g/dL (ref 30.0–36.0)
MCV: 89.8 fL (ref 80.0–100.0)
Monocytes Absolute: 0.5 10*3/uL (ref 0.1–1.0)
Monocytes Relative: 10 %
Neutro Abs: 2.5 10*3/uL (ref 1.7–7.7)
Neutrophils Relative %: 49 %
Platelet Count: 117 10*3/uL — ABNORMAL LOW (ref 150–400)
RBC: 4.1 MIL/uL — ABNORMAL LOW (ref 4.22–5.81)
RDW: 14.5 % (ref 11.5–15.5)
WBC Count: 5.2 10*3/uL (ref 4.0–10.5)
nRBC: 0 % (ref 0.0–0.2)

## 2020-07-24 LAB — LACTATE DEHYDROGENASE: LDH: 142 U/L (ref 98–192)

## 2020-07-24 MED ORDER — DARATUMUMAB-HYALURONIDASE-FIHJ 1800-30000 MG-UT/15ML ~~LOC~~ SOLN
1800.0000 mg | Freq: Once | SUBCUTANEOUS | Status: AC
  Administered 2020-07-24: 1800 mg via SUBCUTANEOUS
  Filled 2020-07-24: qty 15

## 2020-07-24 MED ORDER — BORTEZOMIB CHEMO SQ INJECTION 3.5 MG (2.5MG/ML)
1.3000 mg/m2 | Freq: Once | INTRAMUSCULAR | Status: AC
  Administered 2020-07-24: 2.75 mg via SUBCUTANEOUS
  Filled 2020-07-24: qty 1.1

## 2020-07-24 NOTE — Progress Notes (Signed)
Patient took all pre-meds PTA. dph

## 2020-07-24 NOTE — Telephone Encounter (Signed)
Appts made per 07/24/20 los, pt to gain sch in tx/avs and through mychart   Kenzli Barritt 

## 2020-07-24 NOTE — Progress Notes (Signed)
Hematology and Oncology Follow Up Visit  [name] [name] [id number] May 17, 1940 80 y.o. 07/24/2020   Principle Diagnosis:  IgG kappa myeloma - Relapsed -- 1q duplication Acute thromboembolism of the right lower leg   Past Therapy:        S/P ASCT at [facility] on 04/02/2015 Patient s/p cycle 5 of Velcade/Revlimid/Decadron Revlimid 10mg  po q day (21/28) -- d/c on 09/15/2017   Current Therapy:     Faspro/Velcade/Decadron -- s/p cycle #5- start on 03/20/2020    Zometa 4 mg IV every 3 months  - next dose due 10/2020 Xarelto 10 mg by mouth daily   Interim History:  [name] is here today for follow-up.  He is doing pretty well.  He is wearing a Holter monitor device for his cardiac status.  Hopefully, he is not having any issues with arrhythmia.  We did do a PET scan on him.  This was done about a week ago.  Thankfully, the PET scan did not show any evidence of active myeloma in the bones.  His myeloma is basically stable.  His last monoclonal spike was 0.2 g/dL.  The IgG level was 580 mg/dL.  The Kappa light chain was 10.6 mg/dL.  His appetite is doing okay.  He has had no nausea or vomiting.  He has had no change in bowel or bladder habits.  He is on Lasix so urinates quite a bit.  He has had no rashes.  There has been no chest wall pain.  Overall, his performance status is ECOG 1.    Medications:  Allergies as of 07/24/2020       Reactions   Budesonide-formoterol Fumarate Itching, Other (See Comments)   Codeine Itching   Hydrocodone-acetaminophen Itching   Mushroom Extract Complex Nausea And Vomiting, Other (See Comments)   Only shitake mushroom  causes this reaction. Flu-like symptoms from portabello mushrooms        Medication List        Accurate as of July 24, 2020  9:45 AM. If you have any questions, ask your nurse or doctor.          benzonatate 200 MG capsule Commonly known as: TESSALON Take 1 capsule (200 mg total) by mouth 3 (three) times daily as needed  for cough.   cetirizine 10 MG tablet Commonly known as: EQ Allergy Relief (Cetirizine) Take 1 tablet (10 mg total) by mouth daily.   clopidogrel 75 MG tablet Commonly known as: PLAVIX Take 75 mg by mouth daily.   Cyanocobalamin 1000 MCG Tbcr Take 1 tablet by mouth daily.   dexamethasone 4 MG tablet Commonly known as: DECADRON Take 5 tablets (20 mg total) by mouth See admin instructions. Take 5 tablets once on the day of treatment.   esomeprazole 40 MG capsule Commonly known as: NEXIUM Take 1 capsule (40 mg total) by mouth daily at 12 noon.   famciclovir 500 MG tablet Commonly known as: FAMVIR Take 1 tablet (500 mg total) by mouth daily.   fluticasone 50 MCG/ACT nasal spray Commonly known as: FLONASE Place 2 sprays into both nostrils daily.   furosemide 20 MG tablet Commonly known as: LASIX Take 20 mg by mouth daily as needed.   gabapentin 600 MG tablet Commonly known as: NEURONTIN Take 1 tablet (600 mg total) by mouth daily.   hydrocortisone 2.5 % ointment Apply topically 2 (two) times daily.   levothyroxine 100 MCG tablet Commonly known as: Synthroid Take 1 tablet (100 mcg total) by mouth daily before breakfast.  LORazepam 1 MG tablet Commonly known as: ATIVAN Take 1 mg by mouth at bedtime.   metoprolol succinate 25 MG 24 hr tablet Commonly known as: TOPROL-XL Take 0.5 tablets (12.5 mg total) by mouth daily.   nitroGLYCERIN 0.4 MG SL tablet Commonly known as: NITROSTAT Place 1 tablet (0.4 mg total) under the tongue every 5 (five) minutes as needed. For chest pain   ondansetron 8 MG tablet Commonly known as: Zofran Take 1 tablet (8 mg total) by mouth 2 (two) times daily as needed (Nausea or vomiting).   oxyCODONE-acetaminophen 10-325 MG tablet Commonly known as: PERCOCET Take 1 tablet by mouth every 8 (eight) hours as needed. for pain   potassium chloride SA 20 MEQ tablet Commonly known as: KLOR-CON TAKE 1 TABLET TWICE DAILY   prochlorperazine 10  MG tablet Commonly known as: COMPAZINE Take 1 tablet (10 mg total) by mouth every 6 (six) hours as needed (Nausea or vomiting).   pyridoxine 100 MG tablet Commonly known as: B-6 Take 100 mg by mouth daily.   rivaroxaban 10 MG Tabs tablet Commonly known as: XARELTO Take 1 tablet (10 mg total) by mouth daily with supper.   rosuvastatin 40 MG tablet Commonly known as: CRESTOR Take 40 mg by mouth at bedtime.   sacubitril-valsartan 49-51 MG Commonly known as: ENTRESTO Take by mouth. What changed: Another medication with the same name was removed. Continue taking this medication, and follow the directions you see here. Changed by: [name], MD   tamsulosin 0.4 MG Caps capsule Commonly known as: FLOMAX Take 1 capsule (0.4 mg total) by mouth daily after supper.   Zoledronic Acid 4 MG Solr Inject into the vein.        Allergies:  Allergies  Allergen Reactions   Budesonide-Formoterol Fumarate Itching and Other (See Comments)   Codeine Itching   Hydrocodone-Acetaminophen Itching   Mushroom Extract Complex Nausea And Vomiting and Other (See Comments)    Only shitake mushroom  causes this reaction. Flu-like symptoms from portabello mushrooms    Past Medical History, Surgical history, Social history, and Family History were reviewed and updated.  Review of Systems: Review of Systems  HENT: Negative.    Eyes: Negative.   Respiratory: Negative.    Cardiovascular:  Positive for chest pain.  Gastrointestinal: Negative.   Genitourinary: Negative.   Musculoskeletal:  Positive for back pain.  Skin: Negative.   Neurological: Negative.   Endo/Heme/Allergies: Negative.   Psychiatric/Behavioral: Negative.      Physical Exam:  weight is 212 lb (96.2 kg). His oral temperature is 97.9 F (36.6 C). His blood pressure is 126/51 (abnormal) and his pulse is 66. His respiration is 18 and oxygen saturation is 95%.   Wt Readings from Last 3 Encounters:  07/24/20 212 lb (96.2 kg)   07/08/20 211 lb (95.7 kg)  06/26/20 210 lb 12.8 oz (95.6 kg)    Physical Exam Vitals reviewed.  HENT:     Head: Normocephalic and atraumatic.  Eyes:     Pupils: Pupils are equal, round, and reactive to light.  Cardiovascular:     Rate and Rhythm: Normal rate and regular rhythm.     Heart sounds: Normal heart sounds.  Pulmonary:     Effort: Pulmonary effort is normal.     Breath sounds: Normal breath sounds.  Abdominal:     General: Bowel sounds are normal.     Palpations: Abdomen is soft.  Musculoskeletal:        General: No tenderness or deformity. Normal  range of motion.     Cervical back: Normal range of motion.  Lymphadenopathy:     Cervical: No cervical adenopathy.  Skin:    General: Skin is warm and dry.     Findings: No erythema or rash.  Neurological:     Mental Status: He is alert and oriented to person, place, and time.  Psychiatric:        Behavior: Behavior normal.        Thought Content: Thought content normal.        Judgment: Judgment normal.     Lab Results  Component Value Date   WBC 5.2 07/24/2020   HGB 12.2 (L) 07/24/2020   HCT 36.8 (L) 07/24/2020   MCV 89.8 07/24/2020   PLT 117 (L) 07/24/2020   Lab Results  Component Value Date   FERRITIN 199 07/18/2019   IRON 65 07/18/2019   TIBC 316 07/18/2019   UIBC 251 07/18/2019   IRONPCTSAT 21 07/18/2019   Lab Results  Component Value Date   RETICCTPCT 1.7 07/18/2019   RBC 4.10 (L) 07/24/2020   Lab Results  Component Value Date   KPAFRELGTCHN 106.6 (H) 06/26/2020   LAMBDASER 2.7 (L) 06/26/2020   KAPLAMBRATIO 39.48 (H) 06/26/2020   Lab Results  Component Value Date   IGGSERUM 578 (L) 06/26/2020   IGA 15 (L) 06/26/2020   IGMSERUM 21 06/26/2020   Lab Results  Component Value Date   TOTALPROTELP 5.7 (L) 06/26/2020   ALBUMINELP 3.3 06/26/2020   A1GS 0.3 06/26/2020   A2GS 0.8 06/26/2020   BETS 0.9 06/26/2020   BETA2SER 0.3 02/11/2015   GAMS 0.4 06/26/2020   MSPIKE 0.2 (H) 06/26/2020    SPEI Comment 10/30/2019     Chemistry      Component Value Date/Time   NA 137 07/24/2020 0851   NA 145 (H) 06/28/2019 1501   NA 140 01/27/2017 1044   NA 141 06/05/2015 1036   K 3.4 (L) 07/24/2020 0851   K 3.3 01/27/2017 1044   K 2.9 (LL) 06/05/2015 1036   CL 101 07/24/2020 0851   CL 101 01/27/2017 1044   CO2 28 07/24/2020 0851   CO2 28 01/27/2017 1044   CO2 27 06/05/2015 1036   BUN 18 07/24/2020 0851   BUN 15 06/28/2019 1501   BUN 14 01/27/2017 1044   BUN 13.1 06/05/2015 1036   CREATININE 1.28 (H) 07/24/2020 0851   CREATININE 1.16 10/08/2018 1531   CREATININE 1.0 06/05/2015 1036   GLU 152 03/30/2016 0000      Component Value Date/Time   CALCIUM 9.4 07/24/2020 0851   CALCIUM 8.9 01/27/2017 1044   CALCIUM 9.4 06/05/2015 1036   ALKPHOS 72 07/24/2020 0851   ALKPHOS 126 (H) 01/27/2017 1044   ALKPHOS 94 06/05/2015 1036   AST 14 (L) 07/24/2020 0851   AST 22 06/05/2015 1036   ALT 14 07/24/2020 0851   ALT 33 01/27/2017 1044   ALT 18 06/05/2015 1036   BILITOT 0.7 07/24/2020 0851   BILITOT 0.59 06/05/2015 1036       Impression and Plan: Mr. Kai is a very pleasant 80 yo caucasian gentleman with history of IgG kappa myeloma.  He underwent standard induction chemotherapy and then ultimately stem cell transplant at [facility] in February 2017.   I would like to think that he is responding to treatment.  His myeloma spike is quite low.  We will have to see what his kappa light chains are.  This is the factor of his myeloma that  we had to treat him for.  Hopefully, the Holter monitor will not show any significant cardiac arrhythmia.  However, if there is a cardiac problem, I would not see a problem with him having any intervention.  He is on low-dose Xarelto.  He is doing well with this.  We will still follow him up in 1 month.     [name], MD 6/10/20229:45 AM

## 2020-07-24 NOTE — Patient Instructions (Signed)
[facility]  Discharge Instructions: Thank you for choosing [facility] to provide your oncology and hematology care.   If you have a lab appointment with the [facility], please go directly to the [facility] and check in at the registration area.  Wear comfortable clothing and clothing appropriate for easy access to any Portacath or PICC line.   We strive to give you quality time with your provider. You may need to reschedule your appointment if you arrive late (15 or more minutes).  Arriving late affects you and other patients whose appointments are after yours.  Also, if you miss three or more appointments without notifying the office, you may be dismissed from the clinic at the provider's discretion.      For prescription refill requests, have your pharmacy contact our office and allow 72 hours for refills to be completed.    Today you received the following chemotherapy and/or immunotherapy agents Velcade, Darzalex       To help prevent nausea and vomiting after your treatment, we encourage you to take your nausea medication as directed.  BELOW ARE SYMPTOMS THAT SHOULD BE REPORTED IMMEDIATELY: *FEVER GREATER THAN 100.4 F (38 C) OR HIGHER *CHILLS OR SWEATING *NAUSEA AND VOMITING THAT IS NOT CONTROLLED WITH YOUR NAUSEA MEDICATION *UNUSUAL SHORTNESS OF BREATH *UNUSUAL BRUISING OR BLEEDING *URINARY PROBLEMS (pain or burning when urinating, or frequent urination) *BOWEL PROBLEMS (unusual diarrhea, constipation, pain near the anus) TENDERNESS IN MOUTH AND THROAT WITH OR WITHOUT PRESENCE OF ULCERS (sore throat, sores in mouth, or a toothache) UNUSUAL RASH, SWELLING OR PAIN  UNUSUAL VAGINAL DISCHARGE OR ITCHING   Items with * indicate a potential emergency and should be followed up as soon as possible or go to the Emergency Department if any problems should occur.  Please show the CHEMOTHERAPY ALERT CARD or IMMUNOTHERAPY ALERT CARD at check-in  to the Emergency Department and triage nurse. Should you have questions after your visit or need to cancel or reschedule your appointment, please contact [facility]  [phone] and follow the prompts.  Office hours are 8:00 a.m. to 4:30 p.m. Monday - Friday. Please note that voicemails left after 4:00 p.m. may not be returned until the following business day.  We are closed weekends and major holidays. You have access to a nurse at all times for urgent questions. Please call the main number to the clinic [phone] and follow the prompts.  For any non-urgent questions, you may also contact your provider using MyChart. We now offer e-Visits for anyone 62 and older to request care online for non-urgent symptoms. For details visit [email or web address].   Also download the MyChart app! Go to the app store, search "MyChart", open the app, select [facility], and log in with your MyChart username and password.  Due to Covid, a mask is required upon entering the hospital/clinic. If you do not have a mask, one will be given to you upon arrival. For doctor visits, patients may have 1 support person aged 8 or older with them. For treatment visits, patients cannot have anyone with them due to current Covid guidelines and our immunocompromised population. Daratumumab injection What is this medication? DARATUMUMAB (dar a toom ue mab) is a monoclonal antibody. It is used to treatmultiple myeloma. This medicine may be used for other purposes; ask your health care provider orpharmacist if you have questions. COMMON BRAND NAME(S): DARZALEX What should I tell my care team before I  take this medication? They need to know if you have any of these conditions: hereditary fructose intolerance infection (especially a virus infection such as chickenpox, herpes, or hepatitis B virus) lung or breathing disease (asthma, COPD) an unusual or allergic reaction to daratumumab, sorbitol, other  medicines, foods, dyes, or preservatives pregnant or trying to get pregnant breast-feeding How should I use this medication? This medicine is for infusion into a vein. It is given by a health careprofessional in a hospital or clinic setting. Talk to your pediatrician regarding the use of this medicine in children.Special care may be needed. Overdosage: If you think you have taken too much of this medicine contact apoison control center or emergency room at once. NOTE: This medicine is only for you. Do not share this medicine with others. What if I miss a dose? Keep appointments for follow-up doses as directed. It is important not to miss your dose. Call your doctor or health care professional if you are unable tokeep an appointment. What may interact with this medication? Interactions have not been studied. This list may not describe all possible interactions. Give your health care provider a list of all the medicines, herbs, non-prescription drugs, or dietary supplements you use. Also tell them if you smoke, drink alcohol, or use illegaldrugs. Some items may interact with your medicine. What should I watch for while using this medication? Your condition will be monitored carefully while you are receiving thismedicine. This medicine can cause serious allergic reactions. To reduce your risk, your health care provider may give you other medicine to take before receiving thisone. Be sure to follow the directions from your health care provider. This medicine can affect the results of blood tests to match your blood type. These changes can last for up to 6 months after the final dose. Your healthcare provider will do blood tests to match your blood type before you start treatment. Tell all of your healthcare providers that you are being treatedwith this medicine before receiving a blood transfusion. This medicine can affect the results of some tests used to determine treatmentresponse; extra tests may be  needed to evaluate response. Do not become pregnant while taking this medicine or for 3 months after stopping it. Women should inform their health care provider if they wish to become pregnant or think they might be pregnant. There is a potential for serious side effects to an unborn child. Talk to your health care provider formore information. Do not breast-feed an infant while taking this medicine. What side effects may I notice from receiving this medication? Side effects that you should report to your doctor or health care professionalas soon as possible: allergic reactions (skin rash, itching, hives, swelling of the face, lips, or tongue) blurred vision infection (fever, chills, cough, sore throat, pain or difficulty passing urine) infusion reaction (dizziness, fast heartbeat, feeling faint or lightheaded, falls, headache, increase in blood pressure, nausea, vomiting, or wheezing or trouble breathing with loud or whistling sounds) unusual bleeding or bruising Side effects that usually do not require medical attention (report to yourdoctor or health care professional if they continue or are bothersome): constipation diarrhea pain, tingling, numbness in the hands or feet swelling of the ankles, feet, hands tiredness This list may not describe all possible side effects. Call your doctor for medical advice about side effects. You may report side effects to FDA at1-800-FDA-1088. Where should I keep my medication? This drug is given in a hospital or clinic and will not be stored at home. NOTE:  This sheet is a summary. It may not cover all possible information. If you have questions about this medicine, talk to your doctor, pharmacist, orhealth care provider.  2022 Elsevier/Gold Standard (2020-03-12 12:50:38) Bortezomib injection What is this medication? BORTEZOMIB (bor TEZ oh mib) targets proteins in cancer cells and stops thecancer cells from growing. It treats multiple myeloma and mantle cell  lymphoma. This medicine may be used for other purposes; ask your health care provider orpharmacist if you have questions. COMMON BRAND NAME(S): Velcade What should I tell my care team before I take this medication? They need to know if you have any of these conditions: dehydration diabetes (high blood sugar) heart disease liver disease tingling of the fingers or toes or other nerve disorder an unusual or allergic reaction to bortezomib, mannitol, boron, other medicines, foods, dyes, or preservatives pregnant or trying to get pregnant breast-feeding How should I use this medication? This medicine is injected into a vein or under the skin. It is given by ahealth care provider in a hospital or clinic setting. Talk to your health care provider about the use of this medicine in children.Special care may be needed. Overdosage: If you think you have taken too much of this medicine contact apoison control center or emergency room at once. NOTE: This medicine is only for you. Do not share this medicine with others. What if I miss a dose? Keep appointments for follow-up doses. It is important not to miss your dose.Call your health care provider if you are unable to keep an appointment. What may interact with this medication? This medicine may interact with the following medications: ketoconazole rifampin This list may not describe all possible interactions. Give your health care provider a list of all the medicines, herbs, non-prescription drugs, or dietary supplements you use. Also tell them if you smoke, drink alcohol, or use illegaldrugs. Some items may interact with your medicine. What should I watch for while using this medication? Your condition will be monitored carefully while you are receiving thismedicine. You may need blood work done while you are taking this medicine. You may get drowsy or dizzy. Do not drive, use machinery, or do anything that needs mental alertness until you know how  this medicine affects you. Do not stand up or sit up quickly, especially if you are an older patient. Thisreduces the risk of dizzy or fainting spells This medicine may increase your risk of getting an infection. Call your health care provider for advice if you get a fever, chills, sore throat, or other symptoms of a cold or flu. Do not treat yourself. Try to avoid being aroundpeople who are sick. Check with your health care provider if you have severe diarrhea, nausea, and vomiting, or if you sweat a lot. The loss of too much body fluid may make itdangerous for you to take this medicine. Do not become pregnant while taking this medicine or for 7 months after stopping it. Women should inform their health care provider if they wish to become pregnant or think they might be pregnant. Men should not father a child while taking this medicine and for 4 months after stopping it. There is a potential for serious harm to an unborn child. Talk to your health care provider for more information. Do not breast-feed an infant while taking thismedicine or for 2 months after stopping it. This medicine may make it more difficult to get pregnant or father a child.Talk to your health care provider if you are concerned about your fertility.  What side effects may I notice from receiving this medication? Side effects that you should report to your doctor or health care professionalas soon as possible: allergic reactions (skin rash; itching or hives; swelling of the face, lips, or tongue) bleeding (bloody or black, tarry stools; red or dark brown urine; spitting up blood or brown material that looks like coffee grounds; red spots on the skin; unusual bruising or bleeding from the eye, gums, or nose) blurred vision or changes in vision confusion constipation headache heart failure (trouble breathing; fast, irregular heartbeat; sudden weight gain; swelling of the ankles, feet, hands) infection (fever, chills, cough, sore  throat, pain or trouble passing urine) lack or loss of appetite liver injury (dark yellow or brown urine; general ill feeling or flu-like symptoms; loss of appetite, right upper belly pain; yellowing of the eyes or skin) low blood pressure (dizziness; feeling faint or lightheaded, falls; unusually weak or tired) muscle cramps pain, redness, or irritation at site where injected pain, tingling, numbness in the hands or feet seizures trouble breathing unusual bruising or bleeding Side effects that usually do not require medical attention (report to yourdoctor or health care professional if they continue or are bothersome): diarrhea nausea stomach pain trouble sleeping vomiting This list may not describe all possible side effects. Call your doctor for medical advice about side effects. You may report side effects to FDA at1-800-FDA-1088. Where should I keep my medication? This medicine is given in a hospital or clinic. It will not be stored at home. NOTE: This sheet is a summary. It may not cover all possible information. If you have questions about this medicine, talk to your doctor, pharmacist, orhealth care provider.  2022 Elsevier/Gold Standard (2020-01-23 13:22:53)

## 2020-07-25 LAB — IGG, IGA, IGM
IgA: 11 mg/dL — ABNORMAL LOW (ref 61–437)
IgG (Immunoglobin G), Serum: 490 mg/dL — ABNORMAL LOW (ref 603–1613)
IgM (Immunoglobulin M), Srm: 15 mg/dL (ref 15–143)

## 2020-07-27 LAB — PROTEIN ELECTROPHORESIS, SERUM, WITH REFLEX
A/G Ratio: 1.7 (ref 0.7–1.7)
Albumin ELP: 3.7 g/dL (ref 2.9–4.4)
Alpha-1-Globulin: 0.2 g/dL (ref 0.0–0.4)
Alpha-2-Globulin: 0.8 g/dL (ref 0.4–1.0)
Beta Globulin: 0.8 g/dL (ref 0.7–1.3)
Gamma Globulin: 0.4 g/dL (ref 0.4–1.8)
Globulin, Total: 2.2 g/dL (ref 2.2–3.9)
Total Protein ELP: 5.9 g/dL — ABNORMAL LOW (ref 6.0–8.5)

## 2020-07-27 LAB — KAPPA/LAMBDA LIGHT CHAINS
Kappa free light chain: 121.6 mg/L — ABNORMAL HIGH (ref 3.3–19.4)
Kappa, lambda light chain ratio: 38 — ABNORMAL HIGH (ref 0.26–1.65)
Lambda free light chains: 3.2 mg/L — ABNORMAL LOW (ref 5.7–26.3)

## 2020-07-28 ENCOUNTER — Encounter: Payer: Self-pay | Admitting: *Deleted

## 2020-07-30 DIAGNOSIS — Q828 Other specified congenital malformations of skin: Secondary | ICD-10-CM | POA: Diagnosis not present

## 2020-08-07 ENCOUNTER — Other Ambulatory Visit: Payer: Self-pay

## 2020-08-07 ENCOUNTER — Inpatient Hospital Stay: Payer: Medicare Other

## 2020-08-07 VITALS — BP 127/47 | HR 58 | Temp 98.2°F | Resp 17 | Ht 72.0 in | Wt 212.1 lb

## 2020-08-07 DIAGNOSIS — Z5112 Encounter for antineoplastic immunotherapy: Secondary | ICD-10-CM | POA: Diagnosis not present

## 2020-08-07 DIAGNOSIS — Z86718 Personal history of other venous thrombosis and embolism: Secondary | ICD-10-CM | POA: Diagnosis not present

## 2020-08-07 DIAGNOSIS — C9000 Multiple myeloma not having achieved remission: Secondary | ICD-10-CM

## 2020-08-07 DIAGNOSIS — M47815 Spondylosis without myelopathy or radiculopathy, thoracolumbar region: Secondary | ICD-10-CM

## 2020-08-07 DIAGNOSIS — Z7901 Long term (current) use of anticoagulants: Secondary | ICD-10-CM | POA: Diagnosis not present

## 2020-08-07 DIAGNOSIS — C9002 Multiple myeloma in relapse: Secondary | ICD-10-CM | POA: Diagnosis not present

## 2020-08-07 LAB — COMPREHENSIVE METABOLIC PANEL
ALT: 14 U/L (ref 0–44)
AST: 15 U/L (ref 15–41)
Albumin: 4.2 g/dL (ref 3.5–5.0)
Alkaline Phosphatase: 70 U/L (ref 38–126)
Anion gap: 9 (ref 5–15)
BUN: 19 mg/dL (ref 8–23)
CO2: 27 mmol/L (ref 22–32)
Calcium: 9.2 mg/dL (ref 8.9–10.3)
Chloride: 104 mmol/L (ref 98–111)
Creatinine, Ser: 1.22 mg/dL (ref 0.61–1.24)
GFR, Estimated: >60 mL/min (ref >60)
Glucose, Bld: 142 mg/dL — ABNORMAL HIGH (ref 70–99)
Potassium: 3.9 mmol/L (ref 3.5–5.1)
Sodium: 140 mmol/L (ref 135–145)
Total Bilirubin: 0.6 mg/dL (ref 0.3–1.2)
Total Protein: 6.2 g/dL — ABNORMAL LOW (ref 6.5–8.1)

## 2020-08-07 LAB — CBC WITH DIFFERENTIAL/PLATELET
Abs Immature Granulocytes: 0.01 10*3/uL (ref 0.00–0.07)
Basophils Absolute: 0 10*3/uL (ref 0.0–0.1)
Basophils Relative: 1 %
Eosinophils Absolute: 0 10*3/uL (ref 0.0–0.5)
Eosinophils Relative: 1 %
HCT: 37.4 % — ABNORMAL LOW (ref 39.0–52.0)
Hemoglobin: 12.2 g/dL — ABNORMAL LOW (ref 13.0–17.0)
Immature Granulocytes: 0 %
Lymphocytes Relative: 29 %
Lymphs Abs: 1.2 10*3/uL (ref 0.7–4.0)
MCH: 29.6 pg (ref 26.0–34.0)
MCHC: 32.6 g/dL (ref 30.0–36.0)
MCV: 90.8 fL (ref 80.0–100.0)
Monocytes Absolute: 0.4 10*3/uL (ref 0.1–1.0)
Monocytes Relative: 11 %
Neutro Abs: 2.4 10*3/uL (ref 1.7–7.7)
Neutrophils Relative %: 58 %
Platelets: 107 10*3/uL — ABNORMAL LOW (ref 150–400)
RBC: 4.12 MIL/uL — ABNORMAL LOW (ref 4.22–5.81)
RDW: 14.2 % (ref 11.5–15.5)
WBC: 4 10*3/uL (ref 4.0–10.5)
nRBC: 0 % (ref 0.0–0.2)

## 2020-08-07 MED ORDER — OXYCODONE-ACETAMINOPHEN 10-325 MG PO TABS: 1.0000 | ORAL_TABLET | Freq: Every day | ORAL | 0 refills | Status: DC

## 2020-08-07 MED ORDER — BORTEZOMIB CHEMO SQ INJECTION 3.5 MG (2.5MG/ML)
1.3000 mg/m2 | Freq: Once | INTRAMUSCULAR | Status: AC
  Administered 2020-08-07: 2.75 mg via SUBCUTANEOUS
  Filled 2020-08-07: qty 1.1

## 2020-08-07 MED ORDER — PROCHLORPERAZINE MALEATE 10 MG PO TABS
10.0000 mg | ORAL_TABLET | Freq: Once | ORAL | Status: AC
Start: 2020-08-07 — End: 2020-08-07
  Administered 2020-08-07: 10 mg via ORAL

## 2020-08-07 MED ORDER — DARATUMUMAB-HYALURONIDASE-FIHJ 1800-30000 MG-UT/15ML ~~LOC~~ SOLN
1800.0000 mg | Freq: Once | SUBCUTANEOUS | Status: AC
  Administered 2020-08-07: 1800 mg via SUBCUTANEOUS
  Filled 2020-08-07: qty 15

## 2020-08-10 ENCOUNTER — Other Ambulatory Visit: Payer: Self-pay | Admitting: Sports Medicine

## 2020-08-10 DIAGNOSIS — M47815 Spondylosis without myelopathy or radiculopathy, thoracolumbar region: Secondary | ICD-10-CM

## 2020-08-10 MED ORDER — OXYCODONE-ACETAMINOPHEN 10-325 MG PO TABS: 1.0000 | ORAL_TABLET | Freq: Three times a day (TID) | ORAL | 0 refills | Status: DC | PRN

## 2020-08-10 NOTE — Addendum Note (Signed)
Addended by: Silverio Decamp on: 08/10/2020 01:21 PM   Modules accepted: Orders

## 2020-08-12 ENCOUNTER — Telehealth: Payer: Self-pay

## 2020-08-12 ENCOUNTER — Other Ambulatory Visit: Payer: Self-pay

## 2020-08-12 ENCOUNTER — Ambulatory Visit (INDEPENDENT_AMBULATORY_CARE_PROVIDER_SITE_OTHER): Payer: Medicare Other | Admitting: Emergency Medicine

## 2020-08-12 ENCOUNTER — Encounter: Payer: Self-pay | Admitting: Emergency Medicine

## 2020-08-12 DIAGNOSIS — J449 Chronic obstructive pulmonary disease, unspecified: Secondary | ICD-10-CM | POA: Diagnosis not present

## 2020-08-12 DIAGNOSIS — Z9861 Coronary angioplasty status: Secondary | ICD-10-CM | POA: Diagnosis not present

## 2020-08-12 DIAGNOSIS — J479 Bronchiectasis, uncomplicated: Secondary | ICD-10-CM | POA: Diagnosis not present

## 2020-08-12 DIAGNOSIS — G4733 Obstructive sleep apnea (adult) (pediatric): Secondary | ICD-10-CM

## 2020-08-12 DIAGNOSIS — I2510 Atherosclerotic heart disease of native coronary artery without angina pectoris: Secondary | ICD-10-CM | POA: Diagnosis not present

## 2020-08-12 DIAGNOSIS — M47815 Spondylosis without myelopathy or radiculopathy, thoracolumbar region: Secondary | ICD-10-CM

## 2020-08-12 MED ORDER — OXYCODONE-ACETAMINOPHEN 10-325 MG PO TABS: 1.0000 | ORAL_TABLET | Freq: Three times a day (TID) | ORAL | 0 refills | Status: DC | PRN

## 2020-08-12 NOTE — Telephone Encounter (Signed)
Sending in a 30-day supply, patient will just have to refill monthly.

## 2020-08-12 NOTE — Telephone Encounter (Signed)
Bree from [facility] called stating that Medicare will only allow a 30 day supply of Oxycodone. They want an OK to fill #90 for a 30 day supply instead of the #270 for 90 it is written for. Please advise.   Reference # [id number] [phone]

## 2020-08-12 NOTE — Assessment & Plan Note (Signed)
Cough well-managed.  Rarely needs flutter valve to clear secretions.  He clears about once daily, usually green.  Plan to continue allergy regimen of Zyrtec, for things and when he needs it.  Discussed with him that he could use albuterol to help with secretion clearance if he needs it.

## 2020-08-12 NOTE — Progress Notes (Signed)
   Subjective:    Patient ID: [name] [name], male    DOB: August 03, 1940, 80 y.o.   MRN: [id number]  HPI  ROV 08/12/2020 --[name] [name] is 80 has a history of former tobacco with COPD and not associated bronchiectasis.  Also obstructive sleep apnea for which he has been treated with CPAP 10 cmH2O.  PMH also significant for multiple myeloma post bone marrow transplant 2017, DVT/PE on AC, chronic cough with allergic rhinitis. He has a kappa spike and is being managed by Dr Marin Olp  Currently managed on Zyrtec, fluticasone nasal spray as needed.  He has albuterol which he uses rarely.  Not on maintenance bronchodilator therapy. He reports today that he was hospitalized in April with an acute MI and B infiltrates, PNA vs pulmonary edema. Unfortunately his stamina and energy level have been worse since then. He was not started on scheduled BD's, has albuterol and uses rarely (almost never). He dis work with PT after the hospitalization. He is limited now by exertional angina. He coughs up some sputum every day - greenish mucous.   Compliance report from his CPAP reviewed, range 5/29 through 08/10/2020.  He used it for over 4 hours 100% of the days with average time of 6 hours.  AHI 0.2 with minimal leak.  He continues to get great clinical benefit, has less daytime sleepiness and better daytime breathing when he wears his CPAP.  Clearly benefiting from wearing the device.   Review of Systems As per HPI     Objective:   Physical Exam  Vitals:   08/12/20 1143  BP: 110/68  Pulse: (!) 56  Temp: 97.6 F (36.4 C)  TempSrc: Temporal  SpO2: 96%  Weight: 212 lb 9.6 oz (96.4 kg)  Height: 6' (1.829 m)   Gen: Pleasant, well-nourished, in no distress,  normal affect  ENT: No lesions,  mouth clear,  oropharynx clear, no postnasal drip  Neck: No JVD, no stridor  Lungs: No use of accessory muscles, coarse at B bases,  no wheeze  Cardiovascular: RRR, heart sounds normal, no murmur or gallops, no  peripheral edema  Musculoskeletal: No deformities, no cyanosis or clubbing  Neuro: alert, non focal  Skin: Warm, no lesions or rash     Assessment & Plan:  Obstructive sleep apnea Good compliance with CPAP and good clinical benefit confirmed today.  Plan to continue 10 cmH2O.  He has all of his equipment and is in good repair.  He just got a new mask and he prefers it.  Asthma with COPD No flares, no dyspnea.  Minimal albuterol use.  Plan to continue same regimen.  Bronchiectasis without complication (HCC) Cough well-managed.  Rarely needs flutter valve to clear secretions.  He clears about once daily, usually green.  Plan to continue allergy regimen of Zyrtec, for things and when he needs it.  Discussed with him that he could use albuterol to help with secretion clearance if he needs it.  Baltazar Apo, MD, PhD 08/12/2020, 12:14 PM [facility] [phone] or if no answer [phone]

## 2020-08-12 NOTE — Patient Instructions (Signed)
Please continue to use your CPAP every night as you have been doing. Keep albuterol available to use 2 puffs if needed for shortness of breath, chest tightness, wheezing. Continue your Zyrtec Use fluticasone nasal spray if you need it Follow Dr. Lamonte Sakai in 1 year or sooner if you have any problems.

## 2020-08-12 NOTE — Telephone Encounter (Signed)
Spoke with Bree at [facility] to Zap reduced quantity fill to accommodate Medicare guidelines.

## 2020-08-12 NOTE — Assessment & Plan Note (Signed)
Good compliance with CPAP and good clinical benefit confirmed today.  Plan to continue 10 cmH2O.  He has all of his equipment and is in good repair.  He just got a new mask and he prefers it.

## 2020-08-12 NOTE — Assessment & Plan Note (Signed)
No flares, no dyspnea.  Minimal albuterol use.  Plan to continue same regimen.

## 2020-08-18 DIAGNOSIS — Z20822 Contact with and (suspected) exposure to covid-19: Secondary | ICD-10-CM | POA: Diagnosis not present

## 2020-08-19 DIAGNOSIS — H401110 Primary open-angle glaucoma, right eye, stage unspecified: Secondary | ICD-10-CM | POA: Diagnosis not present

## 2020-08-24 ENCOUNTER — Telehealth: Payer: Self-pay

## 2020-08-24 ENCOUNTER — Inpatient Hospital Stay (HOSPITAL_BASED_OUTPATIENT_CLINIC_OR_DEPARTMENT_OTHER): Payer: Medicare Other | Admitting: Family

## 2020-08-24 ENCOUNTER — Inpatient Hospital Stay: Payer: Medicare Other | Attending: Hematology & Oncology

## 2020-08-24 ENCOUNTER — Encounter: Payer: Self-pay | Admitting: Family

## 2020-08-24 ENCOUNTER — Ambulatory Visit (HOSPITAL_BASED_OUTPATIENT_CLINIC_OR_DEPARTMENT_OTHER)
Admission: RE | Admit: 2020-08-24 | Discharge: 2020-08-24 | Disposition: A | Discharge status: Home / Self Care | Payer: Medicare Other | Source: Ambulatory Visit | Attending: Family | Admitting: Family

## 2020-08-24 ENCOUNTER — Other Ambulatory Visit: Payer: Self-pay

## 2020-08-24 ENCOUNTER — Inpatient Hospital Stay: Payer: Medicare Other

## 2020-08-24 VITALS — BP 126/52 | HR 63 | Temp 98.7°F | Resp 20 | Wt 213.8 lb

## 2020-08-24 DIAGNOSIS — M899 Disorder of bone, unspecified: Secondary | ICD-10-CM

## 2020-08-24 DIAGNOSIS — C9000 Multiple myeloma not having achieved remission: Secondary | ICD-10-CM | POA: Diagnosis not present

## 2020-08-24 DIAGNOSIS — M25552 Pain in left hip: Secondary | ICD-10-CM | POA: Diagnosis not present

## 2020-08-24 DIAGNOSIS — D500 Iron deficiency anemia secondary to blood loss (chronic): Secondary | ICD-10-CM

## 2020-08-24 DIAGNOSIS — M1612 Unilateral primary osteoarthritis, left hip: Secondary | ICD-10-CM | POA: Diagnosis not present

## 2020-08-24 DIAGNOSIS — Z5112 Encounter for antineoplastic immunotherapy: Secondary | ICD-10-CM | POA: Insufficient documentation

## 2020-08-24 DIAGNOSIS — C9002 Multiple myeloma in relapse: Secondary | ICD-10-CM

## 2020-08-24 DIAGNOSIS — Z043 Encounter for examination and observation following other accident: Secondary | ICD-10-CM | POA: Diagnosis not present

## 2020-08-24 LAB — CBC WITH DIFFERENTIAL (CANCER CENTER ONLY)
Abs Immature Granulocytes: 0.01 10*3/uL (ref 0.00–0.07)
Basophils Absolute: 0 10*3/uL (ref 0.0–0.1)
Basophils Relative: 1 %
Eosinophils Absolute: 0 10*3/uL (ref 0.0–0.5)
Eosinophils Relative: 1 %
HCT: 36.6 % — ABNORMAL LOW (ref 39.0–52.0)
Hemoglobin: 11.9 g/dL — ABNORMAL LOW (ref 13.0–17.0)
Immature Granulocytes: 0 %
Lymphocytes Relative: 32 %
Lymphs Abs: 1.6 10*3/uL (ref 0.7–4.0)
MCH: 29.5 pg (ref 26.0–34.0)
MCHC: 32.5 g/dL (ref 30.0–36.0)
MCV: 90.8 fL (ref 80.0–100.0)
Monocytes Absolute: 0.4 10*3/uL (ref 0.1–1.0)
Monocytes Relative: 8 %
Neutro Abs: 2.8 10*3/uL (ref 1.7–7.7)
Neutrophils Relative %: 58 %
Platelet Count: 130 10*3/uL — ABNORMAL LOW (ref 150–400)
RBC: 4.03 MIL/uL — ABNORMAL LOW (ref 4.22–5.81)
RDW: 14.2 % (ref 11.5–15.5)
WBC Count: 4.9 10*3/uL (ref 4.0–10.5)
nRBC: 0 % (ref 0.0–0.2)

## 2020-08-24 LAB — CMP (CANCER CENTER ONLY)
ALT: 11 U/L (ref 0–44)
AST: 16 U/L (ref 15–41)
Albumin: 4.3 g/dL (ref 3.5–5.0)
Alkaline Phosphatase: 89 U/L (ref 38–126)
Anion gap: 9 (ref 5–15)
BUN: 13 mg/dL (ref 8–23)
CO2: 27 mmol/L (ref 22–32)
Calcium: 9.4 mg/dL (ref 8.9–10.3)
Chloride: 105 mmol/L (ref 98–111)
Creatinine: 1.24 mg/dL (ref 0.61–1.24)
GFR, Estimated: 59 mL/min — ABNORMAL LOW (ref >60)
Glucose, Bld: 109 mg/dL — ABNORMAL HIGH (ref 70–99)
Potassium: 3.8 mmol/L (ref 3.5–5.1)
Sodium: 141 mmol/L (ref 135–145)
Total Bilirubin: 0.5 mg/dL (ref 0.3–1.2)
Total Protein: 6.2 g/dL — ABNORMAL LOW (ref 6.5–8.1)

## 2020-08-24 LAB — LACTATE DEHYDROGENASE: LDH: 144 U/L (ref 98–192)

## 2020-08-24 MED ORDER — DIPHENHYDRAMINE HCL 25 MG PO CAPS: 50.0000 mg | ORAL_CAPSULE | Freq: Once | ORAL | Status: DC

## 2020-08-24 MED ORDER — DARATUMUMAB-HYALURONIDASE-FIHJ 1800-30000 MG-UT/15ML ~~LOC~~ SOLN
1800.0000 mg | Freq: Once | SUBCUTANEOUS | Status: AC
  Administered 2020-08-24: 1800 mg via SUBCUTANEOUS
  Filled 2020-08-24: qty 15

## 2020-08-24 MED ORDER — SODIUM CHLORIDE 0.9 % IV SOLN
Freq: Once | INTRAVENOUS | Status: DC
  Filled 2020-08-24: qty 250

## 2020-08-24 MED ORDER — ACETAMINOPHEN 325 MG PO TABS: 650.0000 mg | ORAL_TABLET | Freq: Once | ORAL | Status: DC

## 2020-08-24 MED ORDER — BORTEZOMIB CHEMO SQ INJECTION 3.5 MG (2.5MG/ML)
1.3000 mg/m2 | Freq: Once | INTRAMUSCULAR | Status: AC
  Administered 2020-08-24: 2.75 mg via SUBCUTANEOUS
  Filled 2020-08-24: qty 1.1

## 2020-08-24 MED ORDER — DEXAMETHASONE 4 MG PO TABS: 20.0000 mg | ORAL_TABLET | Freq: Once | ORAL | Status: DC

## 2020-08-24 MED ORDER — SODIUM CHLORIDE 0.9% FLUSH
10.0000 mL | INTRAVENOUS | Status: DC | PRN
  Filled 2020-08-24: qty 10

## 2020-08-24 MED ORDER — HEPARIN SOD (PORK) LOCK FLUSH 100 UNIT/ML IV SOLN
500.0000 [IU] | Freq: Once | INTRAVENOUS | Status: DC | PRN
  Filled 2020-08-24: qty 5

## 2020-08-24 MED ORDER — PROCHLORPERAZINE MALEATE 10 MG PO TABS: 10.0000 mg | ORAL_TABLET | Freq: Once | ORAL | Status: DC

## 2020-08-24 NOTE — Progress Notes (Signed)
Hematology and Oncology Follow Up Visit  [name] [name] [id number] 09/12/1940 80 y.o. 08/24/2020   Principle Diagnosis:  IgG kappa myeloma - Relapsed -- 1q duplication Acute thromboembolism of the right lower leg   Past Therapy:        S/P ASCT at [place] on 04/02/2015 Patient s/p cycle 5 of Velcade/Revlimid/Decadron Revlimid 10mg  po q day (21/28) -- d/c on 09/15/2017   Current Therapy:     Faspro/Velcade/Decadron -- started on 03/20/2020, s/p cycle 6    Zometa 4 mg IV every 3 months  - next dose due 10/2020 Xarelto 10 mg by mouth daily   Interim History:  [name] [name] is here today with his wife for follow-up. He is feeling fatigued and weak particularly in his legs since his MI earlier in the year. He feels that he would have benefited from cardiac rehab.  He had a fall 2 weeks ago due to the leg weakness. Since then he has had left hip pain with sciatica.  The neuropathy in his lower extremities is unchanged.  He is having angina and states that his cardiologist is aware. He has been taking Nitroglycerine when needed.  He had an episode of nausea recently that resolved with taking Zofran.  He is not sleeping and has had some issues with nightmares and is unsure of this is due new medications.  He has not noted any blood loss. No abnormal bruising or petechiae.  In June, his M-spike was not detected, IgG level 490 mg/dL and kappa light chains 12.16 mg/dL.  No fever, chills, vomiting, cough, rash, dizziness, SOB, chest pain, palpitations, abdominal pain or changes in bowel or bladder habits.  He takes lasix daily which has helped reduce fluid retention.  His appetite is good and he is doing his best to stay well hydrated. His weight is stable at 213 lbs.    ECOG Performance Status: 1 - Symptomatic but completely ambulatory  Medications:  Allergies as of 08/24/2020       Reactions   Budesonide-formoterol Fumarate Itching, Other (See Comments)   Codeine Itching    Hydrocodone-acetaminophen Itching   Mushroom Extract Complex Nausea And Vomiting, Other (See Comments)   Only shitake mushroom  causes this reaction. Flu-like symptoms from portabello mushrooms        Medication List        Accurate as of August 24, 2020  1:49 PM. If you have any questions, ask your nurse or doctor.          benzonatate 200 MG capsule Commonly known as: TESSALON Take 1 capsule (200 mg total) by mouth 3 (three) times daily as needed for cough.   cetirizine 10 MG tablet Commonly known as: EQ Allergy Relief (Cetirizine) Take 1 tablet (10 mg total) by mouth daily.   clopidogrel 75 MG tablet Commonly known as: PLAVIX Take 75 mg by mouth daily.   Cyanocobalamin 1000 MCG Tbcr Take 1 tablet by mouth daily.   dexamethasone 4 MG tablet Commonly known as: DECADRON Take 5 tablets (20 mg total) by mouth See admin instructions. Take 5 tablets once on the day of treatment.   esomeprazole 40 MG capsule Commonly known as: NEXIUM Take 1 capsule (40 mg total) by mouth daily at 12 noon.   famciclovir 500 MG tablet Commonly known as: FAMVIR Take 1 tablet (500 mg total) by mouth daily.   fluticasone 50 MCG/ACT nasal spray Commonly known as: FLONASE Place 2 sprays into both nostrils daily.   furosemide 20 MG tablet Commonly known  as: LASIX Take 20 mg by mouth daily as needed.   gabapentin 600 MG tablet Commonly known as: NEURONTIN Take 1 tablet (600 mg total) by mouth daily.   hydrocortisone 2.5 % ointment Apply topically 2 (two) times daily.   levothyroxine 100 MCG tablet Commonly known as: Synthroid Take 1 tablet (100 mcg total) by mouth daily before breakfast.   LORazepam 1 MG tablet Commonly known as: ATIVAN Take 1 mg by mouth at bedtime.   metoprolol succinate 25 MG 24 hr tablet Commonly known as: TOPROL-XL Take 0.5 tablets (12.5 mg total) by mouth daily.   nitroGLYCERIN 0.4 MG SL tablet Commonly known as: NITROSTAT Place 1 tablet (0.4 mg total)  under the tongue every 5 (five) minutes as needed. For chest pain   ondansetron 8 MG tablet Commonly known as: Zofran Take 1 tablet (8 mg total) by mouth 2 (two) times daily as needed (Nausea or vomiting).   oxyCODONE-acetaminophen 10-325 MG tablet Commonly known as: PERCOCET Take 1 tablet by mouth every 8 (eight) hours as needed for pain. for pain   potassium chloride SA 20 MEQ tablet Commonly known as: KLOR-CON TAKE 1 TABLET TWICE DAILY   prochlorperazine 10 MG tablet Commonly known as: COMPAZINE Take 1 tablet (10 mg total) by mouth every 6 (six) hours as needed (Nausea or vomiting).   pyridoxine 100 MG tablet Commonly known as: B-6 Take 100 mg by mouth daily.   rivaroxaban 10 MG Tabs tablet Commonly known as: XARELTO Take 1 tablet (10 mg total) by mouth daily with supper.   rosuvastatin 40 MG tablet Commonly known as: CRESTOR Take 40 mg by mouth at bedtime.   sacubitril-valsartan 49-51 MG Commonly known as: ENTRESTO Take by mouth.   tamsulosin 0.4 MG Caps capsule Commonly known as: FLOMAX Take 1 capsule (0.4 mg total) by mouth daily after supper.   Zoledronic Acid 4 MG Solr Inject into the vein.        Allergies:  Allergies  Allergen Reactions   Budesonide-Formoterol Fumarate Itching and Other (See Comments)   Codeine Itching   Hydrocodone-Acetaminophen Itching   Mushroom Extract Complex Nausea And Vomiting and Other (See Comments)    Only shitake mushroom  causes this reaction. Flu-like symptoms from portabello mushrooms    Past Medical History, Surgical history, Social history, and Family History were reviewed and updated.  Review of Systems: All other 10 point review of systems is negative.   Physical Exam:  vitals were not taken for this visit.   Wt Readings from Last 3 Encounters:  08/12/20 212 lb 9.6 oz (96.4 kg)  08/07/20 212 lb 1.3 oz (96.2 kg)  07/24/20 212 lb (96.2 kg)    Ocular: Sclerae unicteric, pupils equal, round and reactive to  light Ear-nose-throat: Oropharynx clear, dentition fair Lymphatic: No cervical or supraclavicular adenopathy Lungs no rales or rhonchi, good excursion bilaterally Heart regular rate and rhythm, no murmur appreciated Abd soft, nontender, positive bowel sounds MSK no focal spinal tenderness, no joint edema Neuro: non-focal, well-oriented, appropriate affect Breasts: Deferred   Lab Results  Component Value Date   WBC 4.9 08/24/2020   HGB 11.9 (L) 08/24/2020   HCT 36.6 (L) 08/24/2020   MCV 90.8 08/24/2020   PLT 130 (L) 08/24/2020   Lab Results  Component Value Date   FERRITIN 199 07/18/2019   IRON 65 07/18/2019   TIBC 316 07/18/2019   UIBC 251 07/18/2019   IRONPCTSAT 21 07/18/2019   Lab Results  Component Value Date   RETICCTPCT 1.7 07/18/2019  RBC 4.03 (L) 08/24/2020   Lab Results  Component Value Date   KPAFRELGTCHN 121.6 (H) 07/24/2020   LAMBDASER 3.2 (L) 07/24/2020   KAPLAMBRATIO 38.00 (H) 07/24/2020   Lab Results  Component Value Date   IGGSERUM 490 (L) 07/24/2020   IGA 11 (L) 07/24/2020   IGMSERUM 15 07/24/2020   Lab Results  Component Value Date   TOTALPROTELP 5.9 (L) 07/24/2020   ALBUMINELP 3.7 07/24/2020   A1GS 0.2 07/24/2020   A2GS 0.8 07/24/2020   BETS 0.8 07/24/2020   BETA2SER 0.3 02/11/2015   GAMS 0.4 07/24/2020   MSPIKE Not Observed 07/24/2020   SPEI Comment 10/30/2019     Chemistry      Component Value Date/Time   NA 140 08/07/2020 0951   NA 145 (H) 06/28/2019 1501   NA 140 01/27/2017 1044   NA 141 06/05/2015 1036   K 3.9 08/07/2020 0951   K 3.3 01/27/2017 1044   K 2.9 (LL) 06/05/2015 1036   CL 104 08/07/2020 0951   CL 101 01/27/2017 1044   CO2 27 08/07/2020 0951   CO2 28 01/27/2017 1044   CO2 27 06/05/2015 1036   BUN 19 08/07/2020 0951   BUN 15 06/28/2019 1501   BUN 14 01/27/2017 1044   BUN 13.1 06/05/2015 1036   CREATININE 1.22 08/07/2020 0951   CREATININE 1.28 (H) 07/24/2020 0851   CREATININE 1.16 10/08/2018 1531   CREATININE  1.0 06/05/2015 1036   GLU 152 03/30/2016 0000      Component Value Date/Time   CALCIUM 9.2 08/07/2020 0951   CALCIUM 8.9 01/27/2017 1044   CALCIUM 9.4 06/05/2015 1036   ALKPHOS 70 08/07/2020 0951   ALKPHOS 126 (H) 01/27/2017 1044   ALKPHOS 94 06/05/2015 1036   AST 15 08/07/2020 0951   AST 14 (L) 07/24/2020 0851   AST 22 06/05/2015 1036   ALT 14 08/07/2020 0951   ALT 14 07/24/2020 0851   ALT 33 01/27/2017 1044   ALT 18 06/05/2015 1036   BILITOT 0.6 08/07/2020 0951   BILITOT 0.7 07/24/2020 0851   BILITOT 0.59 06/05/2015 1036       Impression and Plan: Mr. Selsor is a very pleasant 80 yo caucasian gentleman with history of IgG kappa myeloma.  He underwent standard induction chemotherapy and then ultimately stem cell [facility] at [facility] in February 2017. We will get an xray today to evaluate his left hip post fall.  Iron studies are pending.  We will proceed with Velcade and Faspro today as planned.  Per MD and pharmacy, he will continue his Velcade every 2 weeks and Faspro Monthly.  Lab and injection in 2 weeks with follow-up in 1 month.  He can contact our office with any questions or concerns.   Laverna Peace, NP 7/11/20221:49 PM

## 2020-08-24 NOTE — Telephone Encounter (Signed)
Appts made per 08/24/20 los and pt to gain sch in tx/avs and through [employer]

## 2020-08-24 NOTE — Patient Instructions (Addendum)
[facility]  Discharge Instructions: Thank you for choosing [facility] to provide your oncology and hematology care.   If you have a lab appointment with the [facility], please go directly to the [facility] and check in at the registration area.  Wear comfortable clothing and clothing appropriate for easy access to any Portacath or PICC line.   We strive to give you quality time with your provider. You may need to reschedule your appointment if you arrive late (15 or more minutes).  Arriving late affects you and other patients whose appointments are after yours.  Also, if you miss three or more appointments without notifying the office, you may be dismissed from the clinic at the provider's discretion.      For prescription refill requests, have your pharmacy contact our office and allow 72 hours for refills to be completed.    Today you received the following chemotherapy and/or immunotherapy agents velcade and faspro   To help prevent nausea and vomiting after your treatment, we encourage you to take your nausea medication as directed.  BELOW ARE SYMPTOMS THAT SHOULD BE REPORTED IMMEDIATELY: *FEVER GREATER THAN 100.4 F (38 C) OR HIGHER *CHILLS OR SWEATING *NAUSEA AND VOMITING THAT IS NOT CONTROLLED WITH YOUR NAUSEA MEDICATION *UNUSUAL SHORTNESS OF BREATH *UNUSUAL BRUISING OR BLEEDING *URINARY PROBLEMS (pain or burning when urinating, or frequent urination) *BOWEL PROBLEMS (unusual diarrhea, constipation, pain near the anus) TENDERNESS IN MOUTH AND THROAT WITH OR WITHOUT PRESENCE OF ULCERS (sore throat, sores in mouth, or a toothache) UNUSUAL RASH, SWELLING OR PAIN  UNUSUAL VAGINAL DISCHARGE OR ITCHING   Items with * indicate a potential emergency and should be followed up as soon as possible or go to the Emergency Department if any problems should occur.  Please show the CHEMOTHERAPY ALERT CARD or IMMUNOTHERAPY ALERT CARD at check-in to  the Emergency Department and triage nurse. Should you have questions after your visit or need to cancel or reschedule your appointment, please contact [facility]  [phone] and follow the prompts.  Office hours are 8:00 a.m. to 4:30 p.m. Monday - Friday. Please note that voicemails left after 4:00 p.m. may not be returned until the following business day.  We are closed weekends and major holidays. You have access to a nurse at all times for urgent questions. Please call the main number to the clinic [phone] and follow the prompts.  For any non-urgent questions, you may also contact your provider using MyChart. We now offer e-Visits for anyone 81 and older to request care online for non-urgent symptoms. For details visit [email or web address].   Also download the MyChart app! Go to the app store, search "MyChart", open the app, select [facility], and log in with your MyChart username and password.  Due to Covid, a mask is required upon entering the hospital/clinic. If you do not have a mask, one will be given to you upon arrival. For doctor visits, patients may have 1 support person aged 76 or older with them. For treatment visits, patients cannot have anyone with them due to current Covid guidelines and our immunocompromised population. [facility]  Discharge Instructions: Thank you for choosing [facility] to provide your oncology and hematology care.   If you have a lab appointment with the [facility], please go directly to the [facility] and check in at the registration area.  Wear comfortable clothing and  clothing appropriate for easy access to any Portacath or PICC line.   We strive to give you quality time with your provider. You may need to reschedule your appointment if you arrive late (15 or more minutes).  Arriving late affects you and other patients whose appointments are after yours.  Also, if you miss  three or more appointments without notifying the office, you may be dismissed from the clinic at the provider's discretion.      For prescription refill requests, have your pharmacy contact our office and allow 72 hours for refills to be completed.    Today you received the following chemotherapy and/or immunotherapy agents velcade, fastpro    To help prevent nausea and vomiting after your treatment, we encourage you to take your nausea medication as directed.  BELOW ARE SYMPTOMS THAT SHOULD BE REPORTED IMMEDIATELY: *FEVER GREATER THAN 100.4 F (38 C) OR HIGHER *CHILLS OR SWEATING *NAUSEA AND VOMITING THAT IS NOT CONTROLLED WITH YOUR NAUSEA MEDICATION *UNUSUAL SHORTNESS OF BREATH *UNUSUAL BRUISING OR BLEEDING *URINARY PROBLEMS (pain or burning when urinating, or frequent urination) *BOWEL PROBLEMS (unusual diarrhea, constipation, pain near the anus) TENDERNESS IN MOUTH AND THROAT WITH OR WITHOUT PRESENCE OF ULCERS (sore throat, sores in mouth, or a toothache) UNUSUAL RASH, SWELLING OR PAIN  UNUSUAL VAGINAL DISCHARGE OR ITCHING   Items with * indicate a potential emergency and should be followed up as soon as possible or go to the Emergency Department if any problems should occur.  Please show the CHEMOTHERAPY ALERT CARD or IMMUNOTHERAPY ALERT CARD at check-in to the Emergency Department and triage nurse. Should you have questions after your visit or need to cancel or reschedule your appointment, please contact [facility]  [phone] and follow the prompts.  Office hours are 8:00 a.m. to 4:30 p.m. Monday - Friday. Please note that voicemails left after 4:00 p.m. may not be returned until the following business day.  We are closed weekends and major holidays. You have access to a nurse at all times for urgent questions. Please call the main number to the clinic [phone] and follow the prompts.  For any non-urgent questions, you may also contact your provider  using MyChart. We now offer e-Visits for anyone 32 and older to request care online for non-urgent symptoms. For details visit [email or web address].   Also download the MyChart app! Go to the app store, search "MyChart", open the app, select , and log in with your MyChart username and password.  Due to Covid, a mask is required upon entering the hospital/clinic. If you do not have a mask, one will be given to you upon arrival. For doctor visits, patients may have 1 support person aged 4 or older with them. For treatment visits, patients cannot have anyone with them due to current Covid guidelines and our immunocompromised population.

## 2020-08-25 ENCOUNTER — Telehealth: Payer: Self-pay | Admitting: *Deleted

## 2020-08-25 LAB — IRON AND TIBC
Iron: 55 ug/dL (ref 42–163)
Saturation Ratios: 18 % — ABNORMAL LOW (ref 20–55)
TIBC: 309 ug/dL (ref 202–409)
UIBC: 254 ug/dL (ref 117–376)

## 2020-08-25 LAB — IGG, IGA, IGM
IgA: 15 mg/dL — ABNORMAL LOW (ref 61–437)
IgG (Immunoglobin G), Serum: 506 mg/dL — ABNORMAL LOW (ref 603–1613)
IgM (Immunoglobulin M), Srm: 17 mg/dL (ref 15–143)

## 2020-08-25 LAB — FERRITIN: Ferritin: 134 ng/mL (ref 24–336)

## 2020-08-25 LAB — KAPPA/LAMBDA LIGHT CHAINS
Kappa free light chain: 151.5 mg/L — ABNORMAL HIGH (ref 3.3–19.4)
Kappa, lambda light chain ratio: 48.87 — ABNORMAL HIGH (ref 0.26–1.65)
Lambda free light chains: 3.1 mg/L — ABNORMAL LOW (ref 5.7–26.3)

## 2020-08-25 NOTE — Telephone Encounter (Addendum)
-----   Message from Eliezer Bottom, NP sent at 08/25/2020  1:07 PM EDT ----- Iron saturation is still low per Laverna Peace NP.  Called patient to see if he Would he like an Iv iron infusion.  Patient stated that he restarted his iron supplement so that will be good for now.  Told patient to call us if this changes  Sarah  ----- Message ----- From: Buel Ream, Lab In [place] Sent: 08/25/2020  11:01 AM EDT To: Eliezer Bottom, NP

## 2020-08-27 LAB — IMMUNOFIXATION REFLEX, SERUM
IgA: 17 mg/dL — ABNORMAL LOW (ref 61–437)
IgG (Immunoglobin G), Serum: 555 mg/dL — ABNORMAL LOW (ref 603–1613)
IgM (Immunoglobulin M), Srm: 17 mg/dL (ref 15–143)

## 2020-08-27 LAB — PROTEIN ELECTROPHORESIS, SERUM, WITH REFLEX
A/G Ratio: 1.6 (ref 0.7–1.7)
Albumin ELP: 3.7 g/dL (ref 2.9–4.4)
Alpha-1-Globulin: 0.2 g/dL (ref 0.0–0.4)
Alpha-2-Globulin: 0.9 g/dL (ref 0.4–1.0)
Beta Globulin: 0.9 g/dL (ref 0.7–1.3)
Gamma Globulin: 0.3 g/dL — ABNORMAL LOW (ref 0.4–1.8)
Globulin, Total: 2.3 g/dL (ref 2.2–3.9)
M-Spike, %: 0.2 g/dL — ABNORMAL HIGH
SPEP Interpretation: 0
Total Protein ELP: 6 g/dL (ref 6.0–8.5)

## 2020-08-28 ENCOUNTER — Telehealth: Payer: Self-pay | Admitting: *Deleted

## 2020-08-28 ENCOUNTER — Telehealth: Payer: Self-pay

## 2020-08-28 NOTE — Telephone Encounter (Signed)
Patient notified per order of S. [place] NP that the left hip xray does show a lesion, but no break and that Judson Roch will have Dr. Marin Olp review images from PET scan from 5/22 with this xray upon his return to the office on Monday, 08/31/20.  Pt is appreciative of information and was informed to expect a call on Monday with further information.

## 2020-08-28 NOTE — Telephone Encounter (Signed)
Pt called and he needed to r/s his appt due to a conflict with his derm     Jene Huq

## 2020-08-31 ENCOUNTER — Other Ambulatory Visit: Payer: Self-pay | Admitting: Family

## 2020-08-31 ENCOUNTER — Encounter: Payer: Self-pay | Admitting: *Deleted

## 2020-08-31 DIAGNOSIS — M899 Disorder of bone, unspecified: Secondary | ICD-10-CM

## 2020-08-31 DIAGNOSIS — C9000 Multiple myeloma not having achieved remission: Secondary | ICD-10-CM

## 2020-09-03 ENCOUNTER — Ambulatory Visit (HOSPITAL_BASED_OUTPATIENT_CLINIC_OR_DEPARTMENT_OTHER)
Admission: RE | Admit: 2020-09-03 | Discharge: 2020-09-03 | Disposition: A | Discharge status: Home / Self Care | Payer: Medicare Other | Source: Ambulatory Visit | Attending: Family | Admitting: Family

## 2020-09-03 ENCOUNTER — Other Ambulatory Visit: Payer: Self-pay

## 2020-09-03 ENCOUNTER — Encounter (HOSPITAL_BASED_OUTPATIENT_CLINIC_OR_DEPARTMENT_OTHER): Payer: Self-pay

## 2020-09-03 ENCOUNTER — Encounter: Payer: Self-pay | Admitting: *Deleted

## 2020-09-03 DIAGNOSIS — C9000 Multiple myeloma not having achieved remission: Secondary | ICD-10-CM | POA: Insufficient documentation

## 2020-09-03 DIAGNOSIS — M899 Disorder of bone, unspecified: Secondary | ICD-10-CM | POA: Insufficient documentation

## 2020-09-03 DIAGNOSIS — M1612 Unilateral primary osteoarthritis, left hip: Secondary | ICD-10-CM | POA: Diagnosis not present

## 2020-09-03 MED ORDER — IOHEXOL 300 MG/ML  SOLN
100.0000 mL | Freq: Once | INTRAMUSCULAR | Status: AC | PRN
  Administered 2020-09-03: 100 mL via INTRAVENOUS

## 2020-09-06 ENCOUNTER — Other Ambulatory Visit: Payer: Self-pay | Admitting: Hematology & Oncology

## 2020-09-06 DIAGNOSIS — C9000 Multiple myeloma not having achieved remission: Secondary | ICD-10-CM

## 2020-09-07 ENCOUNTER — Inpatient Hospital Stay: Payer: Medicare Other

## 2020-09-07 ENCOUNTER — Other Ambulatory Visit: Payer: Self-pay

## 2020-09-07 ENCOUNTER — Encounter: Payer: Self-pay | Admitting: Hematology & Oncology

## 2020-09-07 VITALS — BP 112/57 | HR 53 | Temp 97.7°F | Resp 18

## 2020-09-07 DIAGNOSIS — C9000 Multiple myeloma not having achieved remission: Secondary | ICD-10-CM

## 2020-09-07 DIAGNOSIS — Z5112 Encounter for antineoplastic immunotherapy: Secondary | ICD-10-CM | POA: Diagnosis not present

## 2020-09-07 DIAGNOSIS — C9001 Multiple myeloma in remission: Secondary | ICD-10-CM

## 2020-09-07 DIAGNOSIS — M899 Disorder of bone, unspecified: Secondary | ICD-10-CM

## 2020-09-07 DIAGNOSIS — C9002 Multiple myeloma in relapse: Secondary | ICD-10-CM | POA: Diagnosis not present

## 2020-09-07 LAB — CBC WITH DIFFERENTIAL/PLATELET
Abs Immature Granulocytes: 0.02 10*3/uL (ref 0.00–0.07)
Basophils Absolute: 0 10*3/uL (ref 0.0–0.1)
Basophils Relative: 1 %
Eosinophils Absolute: 0 10*3/uL (ref 0.0–0.5)
Eosinophils Relative: 0 %
HCT: 37.1 % — ABNORMAL LOW (ref 39.0–52.0)
Hemoglobin: 12.1 g/dL — ABNORMAL LOW (ref 13.0–17.0)
Immature Granulocytes: 0 %
Lymphocytes Relative: 37 %
Lymphs Abs: 2 10*3/uL (ref 0.7–4.0)
MCH: 29.5 pg (ref 26.0–34.0)
MCHC: 32.6 g/dL (ref 30.0–36.0)
MCV: 90.5 fL (ref 80.0–100.0)
Monocytes Absolute: 0.5 10*3/uL (ref 0.1–1.0)
Monocytes Relative: 10 %
Neutro Abs: 2.8 10*3/uL (ref 1.7–7.7)
Neutrophils Relative %: 52 %
Platelets: 122 10*3/uL — ABNORMAL LOW (ref 150–400)
RBC: 4.1 MIL/uL — ABNORMAL LOW (ref 4.22–5.81)
RDW: 14.5 % (ref 11.5–15.5)
WBC: 5.4 10*3/uL (ref 4.0–10.5)
nRBC: 0 % (ref 0.0–0.2)

## 2020-09-07 LAB — COMPREHENSIVE METABOLIC PANEL
ALT: 13 U/L (ref 0–44)
AST: 15 U/L (ref 15–41)
Albumin: 4.1 g/dL (ref 3.5–5.0)
Alkaline Phosphatase: 80 U/L (ref 38–126)
Anion gap: 6 (ref 5–15)
BUN: 12 mg/dL (ref 8–23)
CO2: 30 mmol/L (ref 22–32)
Calcium: 9.2 mg/dL (ref 8.9–10.3)
Chloride: 104 mmol/L (ref 98–111)
Creatinine, Ser: 1.23 mg/dL (ref 0.61–1.24)
GFR, Estimated: 60 mL/min — ABNORMAL LOW (ref >60)
Glucose, Bld: 107 mg/dL — ABNORMAL HIGH (ref 70–99)
Potassium: 4.2 mmol/L (ref 3.5–5.1)
Sodium: 140 mmol/L (ref 135–145)
Total Bilirubin: 0.6 mg/dL (ref 0.3–1.2)
Total Protein: 6.2 g/dL — ABNORMAL LOW (ref 6.5–8.1)

## 2020-09-07 MED ORDER — ZOLEDRONIC ACID 4 MG/100ML IV SOLN
4.0000 mg | Freq: Once | INTRAVENOUS | Status: AC
  Administered 2020-09-07: 4 mg via INTRAVENOUS
  Filled 2020-09-07: qty 100

## 2020-09-07 MED ORDER — BORTEZOMIB CHEMO SQ INJECTION 3.5 MG (2.5MG/ML)
1.3000 mg/m2 | Freq: Once | INTRAMUSCULAR | Status: AC
  Administered 2020-09-07: 2.75 mg via SUBCUTANEOUS
  Filled 2020-09-07: qty 1.1

## 2020-09-07 MED ORDER — SODIUM CHLORIDE 0.9 % IV SOLN
INTRAVENOUS | Status: DC
  Filled 2020-09-07: qty 250

## 2020-09-07 MED ORDER — PROCHLORPERAZINE MALEATE 10 MG PO TABS: 10.0000 mg | ORAL_TABLET | Freq: Once | ORAL | Status: DC

## 2020-09-07 MED ORDER — DARATUMUMAB-HYALURONIDASE-FIHJ 1800-30000 MG-UT/15ML ~~LOC~~ SOLN
1800.0000 mg | Freq: Once | SUBCUTANEOUS | Status: AC
  Administered 2020-09-07: 1800 mg via SUBCUTANEOUS
  Filled 2020-09-07: qty 15

## 2020-09-07 NOTE — Progress Notes (Signed)
Patient refused to wait one hour post injection. Stable and ASX upon discharge.

## 2020-09-07 NOTE — Patient Instructions (Signed)
[facility]  Discharge Instructions: Thank you for choosing [facility] to provide your oncology and hematology care.   If you have a lab appointment with the [facility], please go directly to the [facility] and check in at the registration area.  Wear comfortable clothing and clothing appropriate for easy access to any Portacath or PICC line.   We strive to give you quality time with your provider. You may need to reschedule your appointment if you arrive late (15 or more minutes).  Arriving late affects you and other patients whose appointments are after yours.  Also, if you miss three or more appointments without notifying the office, you may be dismissed from the clinic at the provider's discretion.      For prescription refill requests, have your pharmacy contact our office and allow 72 hours for refills to be completed.    Today you received the following chemotherapy and/or immunotherapy agents Darzalex Faspro, Velcade.     To help prevent nausea and vomiting after your treatment, we encourage you to take your nausea medication as directed.  BELOW ARE SYMPTOMS THAT SHOULD BE REPORTED IMMEDIATELY: *FEVER GREATER THAN 100.4 F (38 C) OR HIGHER *CHILLS OR SWEATING *NAUSEA AND VOMITING THAT IS NOT CONTROLLED WITH YOUR NAUSEA MEDICATION *UNUSUAL SHORTNESS OF BREATH *UNUSUAL BRUISING OR BLEEDING *URINARY PROBLEMS (pain or burning when urinating, or frequent urination) *BOWEL PROBLEMS (unusual diarrhea, constipation, pain near the anus) TENDERNESS IN MOUTH AND THROAT WITH OR WITHOUT PRESENCE OF ULCERS (sore throat, sores in mouth, or a toothache) UNUSUAL RASH, SWELLING OR PAIN  UNUSUAL VAGINAL DISCHARGE OR ITCHING   Items with * indicate a potential emergency and should be followed up as soon as possible or go to the Emergency Department if any problems should occur.  Please show the CHEMOTHERAPY ALERT CARD or IMMUNOTHERAPY ALERT CARD at  check-in to the Emergency Department and triage nurse. Should you have questions after your visit or need to cancel or reschedule your appointment, please contact [facility]  [phone] and follow the prompts.  Office hours are 8:00 a.m. to 4:30 p.m. Monday - Friday. Please note that voicemails left after 4:00 p.m. may not be returned until the following business day.  We are closed weekends and major holidays. You have access to a nurse at all times for urgent questions. Please call the main number to the clinic [phone] and follow the prompts.  For any non-urgent questions, you may also contact your provider using MyChart. We now offer e-Visits for anyone 80 and older to request care online for non-urgent symptoms. For details visit [email or web address].   Also download the MyChart app! Go to the app store, search "MyChart", open the app, select [facility], and log in with your MyChart username and password.  Due to Covid, a mask is required upon entering the hospital/clinic. If you do not have a mask, one will be given to you upon arrival. For doctor visits, patients may have 1 support person aged 80 or older with them. For treatment visits, patients cannot have anyone with them due to current Covid guidelines and our immunocompromised population.

## 2020-09-08 DIAGNOSIS — Z789 Other specified health status: Secondary | ICD-10-CM | POA: Diagnosis not present

## 2020-09-08 DIAGNOSIS — E119 Type 2 diabetes mellitus without complications: Secondary | ICD-10-CM | POA: Diagnosis not present

## 2020-09-08 DIAGNOSIS — G4733 Obstructive sleep apnea (adult) (pediatric): Secondary | ICD-10-CM | POA: Diagnosis not present

## 2020-09-08 DIAGNOSIS — I255 Ischemic cardiomyopathy: Secondary | ICD-10-CM | POA: Diagnosis not present

## 2020-09-08 DIAGNOSIS — I10 Essential (primary) hypertension: Secondary | ICD-10-CM | POA: Diagnosis not present

## 2020-09-08 DIAGNOSIS — I259 Chronic ischemic heart disease, unspecified: Secondary | ICD-10-CM | POA: Diagnosis not present

## 2020-09-08 DIAGNOSIS — Z955 Presence of coronary angioplasty implant and graft: Secondary | ICD-10-CM | POA: Diagnosis not present

## 2020-09-08 DIAGNOSIS — I209 Angina pectoris, unspecified: Secondary | ICD-10-CM | POA: Diagnosis not present

## 2020-09-08 DIAGNOSIS — I2699 Other pulmonary embolism without acute cor pulmonale: Secondary | ICD-10-CM | POA: Diagnosis not present

## 2020-09-08 DIAGNOSIS — Z9989 Dependence on other enabling machines and devices: Secondary | ICD-10-CM | POA: Diagnosis not present

## 2020-09-08 DIAGNOSIS — I2510 Atherosclerotic heart disease of native coronary artery without angina pectoris: Secondary | ICD-10-CM | POA: Diagnosis not present

## 2020-09-16 ENCOUNTER — Ambulatory Visit (INDEPENDENT_AMBULATORY_CARE_PROVIDER_SITE_OTHER): Payer: Medicare Other | Admitting: Sports Medicine

## 2020-09-16 DIAGNOSIS — Z0000 Encounter for general adult medical examination without abnormal findings: Secondary | ICD-10-CM

## 2020-09-16 NOTE — Progress Notes (Signed)
MEDICARE ANNUAL WELLNESS VISIT  09/16/2020  Telephone Visit Disclaimer This Medicare AWV was conducted by telephone due to national recommendations for restrictions regarding the COVID-19 Pandemic (e.g. social distancing).  I verified, using two identifiers, that I am speaking with [name] [name] or their authorized healthcare agent. I discussed the limitations, risks, security, and privacy concerns of performing an evaluation and management service by telephone and the potential availability of an in-person appointment in the future. The patient expressed understanding and agreed to proceed.  Location of Patient: Home Location of Provider (nurse):  In the office.  Subjective:    [name] is a 80 y.o. male patient of [name], [name], MD who had a Medicare Annual Wellness Visit today via telephone. [name] is Retired and lives with their spouse. he has 3 children. he reports that he is socially active and does interact with friends/family regularly. he is minimally physically active and enjoys playing games on the computer and playing cards with his wife..  Patient Care Team: Monica Becton, MD as PCP - General (Sports Medicine) Jens Som Madolyn Frieze, MD as PCP - Cardiology (Cardiology) Hilda Lias, MD as Consulting Physician (Neurosurgery) Callie Fielding, MD as Consulting Physician (Physical Medicine and Rehabilitation)  Advanced Directives 09/16/2020 08/24/2020 07/24/2020 06/26/2020 06/05/2020 05/06/2020 04/10/2020  Does Patient Have a Medical Advance Directive? Yes Yes Yes Yes Yes Yes Yes  Type of Advance Directive Living will;Healthcare Power of Attorney Living will;Healthcare Power of Attorney Living will;Healthcare Power of Attorney Living will Living will Healthcare Power of [place];Living will Healthcare Power of [place];Living will  Does patient want to make changes to medical advance directive? No - Patient declined - No - Patient declined - No - Patient declined  No - Patient declined No - Patient declined  Copy of Healthcare Power of Attorney in Chart? No - copy requested - - - - - -  Would patient like information on creating a medical advance directive? - - - - - - -  Pre-existing out of facility DNR order (yellow form or pink MOST form) - - - - - - -    Hospital Utilization Over the Past 12 Months: # of hospitalizations or ER visits: 1 # of surgeries: 0  Review of Systems    Patient reports that his overall health is worse compared to last year.  History obtained from chart review and the patient  Patient Reported Readings (BP, Pulse, CBG, Weight, etc) none  Pain Assessment Pain : 0-10 Pain Score: 4  Pain Type: Chronic pain Pain Location: Back Pain Orientation: Lower Pain Descriptors / Indicators: Constant Pain Onset: More than a month ago Pain Frequency: Constant Pain Relieving Factors: Pain medication  Pain Relieving Factors: Pain medication  Current Medications & Allergies (verified) Allergies as of 09/16/2020       Reactions   Budesonide-formoterol Fumarate Itching, Other (See Comments)   Codeine Itching   Hydrocodone-acetaminophen Itching   Mushroom Extract Complex Nausea And Vomiting, Other (See Comments)   Only shitake mushroom  causes this reaction. Flu-like symptoms from portabello mushrooms        Medication List        Accurate as of September 16, 2020 10:19 AM. If you have any questions, ask your nurse or doctor.          benzonatate 200 MG capsule Commonly known as: TESSALON Take 1 capsule (200 mg total) by mouth 3 (three) times daily as needed for cough.   cetirizine 10 MG tablet Commonly known as:  EQ Allergy Relief (Cetirizine) Take 1 tablet (10 mg total) by mouth daily.   clopidogrel 75 MG tablet Commonly known as: PLAVIX Take 75 mg by mouth daily.   Cyanocobalamin 1000 MCG Tbcr Take 1 tablet by mouth daily.   dexamethasone 4 MG tablet Commonly known as: DECADRON TAKE 5 TABLETS (20 MG  TOTAL) BY MOUTH SEE ADMIN INSTRUCTIONS (TAKE 5 TABLETS ONCE ON THE DAY OF TREATMENT)   esomeprazole 40 MG capsule Commonly known as: NEXIUM Take 1 capsule (40 mg total) by mouth daily at 12 noon.   famciclovir 500 MG tablet Commonly known as: FAMVIR Take 1 tablet (500 mg total) by mouth daily.   fluticasone 50 MCG/ACT nasal spray Commonly known as: FLONASE Place 2 sprays into both nostrils daily.   furosemide 20 MG tablet Commonly known as: LASIX Take 20 mg by mouth daily as needed.   gabapentin 600 MG tablet Commonly known as: NEURONTIN Take 1 tablet (600 mg total) by mouth daily.   hydrocortisone 2.5 % ointment Apply topically 2 (two) times daily.   isosorbide mononitrate 60 MG 24 hr tablet Commonly known as: IMDUR Take by mouth.   levothyroxine 100 MCG tablet Commonly known as: Synthroid Take 1 tablet (100 mcg total) by mouth daily before breakfast.   LORazepam 1 MG tablet Commonly known as: ATIVAN Take 1 mg by mouth at bedtime.   metoprolol succinate 25 MG 24 hr tablet Commonly known as: TOPROL-XL Take 0.5 tablets (12.5 mg total) by mouth daily.   nitroGLYCERIN 0.4 MG SL tablet Commonly known as: NITROSTAT Place 1 tablet (0.4 mg total) under the tongue every 5 (five) minutes as needed. For chest pain   ondansetron 8 MG tablet Commonly known as: Zofran Take 1 tablet (8 mg total) by mouth 2 (two) times daily as needed (Nausea or vomiting).   oxyCODONE-acetaminophen 10-325 MG tablet Commonly known as: PERCOCET Take 1 tablet by mouth every 8 (eight) hours as needed for pain. for pain   potassium chloride SA 20 MEQ tablet Commonly known as: KLOR-CON TAKE 1 TABLET TWICE DAILY   prochlorperazine 10 MG tablet Commonly known as: COMPAZINE Take 1 tablet (10 mg total) by mouth every 6 (six) hours as needed (Nausea or vomiting).   pyridoxine 100 MG tablet Commonly known as: B-6 Take 100 mg by mouth daily.   rivaroxaban 10 MG Tabs tablet Commonly known as:  XARELTO Take 1 tablet (10 mg total) by mouth daily with supper.   rosuvastatin 40 MG tablet Commonly known as: CRESTOR Take 40 mg by mouth at bedtime.   sacubitril-valsartan 49-51 MG Commonly known as: ENTRESTO Take by mouth 2 (two) times daily.   tamsulosin 0.4 MG Caps capsule Commonly known as: FLOMAX Take 1 capsule (0.4 mg total) by mouth daily after supper.   Zoledronic Acid 4 MG Solr Inject into the vein.        History (reviewed): Past Medical History:  Diagnosis Date   Acute deep vein thrombosis (DVT) of tibial vein of right lower extremity (HCC) 09/29/2014   Arthritis    back   Asthma    uses Advair bid and asmanex prn   Cancer ([place])    multiple myeloma   Cataract 2019   CHF (congestive heart failure) ([place]) 2019   Chronic back pain    buldging disc and scoliosis   Chronic bronchitis (HCC)    couple of yrs ago   Complication of anesthesia    severe case of hiccups after hernia surgery;required medication   Diverticulosis  Dizziness    related to neck issues   Emphysema    Emphysema of lung ([place]) 2015   Enlarged prostate    takes Flomax daily   GERD (gastroesophageal reflux disease)    takes PRotonix daily   History of colon polyps    Hyperlipidemia    takes Zocor daily   Hypertension    takes HCTZ,Diovan daily and Isosorbide daily   Itching    down left arm and leg   Joint pain    Macular degeneration    mild,beginning   Multiple myeloma ([place]) 08/13/2014   Myocardial infarction ([place]) 2010   Neck pain    Neuromuscular disorder ([place]) 2017   Osteoporosis 2015   Pain in limb 06/20/2012   Pneumonia    hx of---12+yrs ago   Sleep apnea 2015   Thyroid disease 2017   Past Surgical History:  Procedure Laterality Date   ANTERIOR CERVICAL DECOMP/DISCECTOMY FUSION  01/31/2012   Procedure: ANTERIOR CERVICAL DECOMPRESSION/DISCECTOMY FUSION 1 LEVEL;  Surgeon: Floyce Stakes, MD;  Location: [facility];  Service: Neurosurgery;  Laterality: N/A;   Cervical Five-Six  Anterior Cervical Decompression /Diskectomy /Fusion/Plate   BONE MARROW TRANSPLANT     CARDIAC CATHETERIZATION  2010/2013   06/07/11 [facility]) LAD 25%, D2 75% (small), pLCx 100%, RCA 25%--medical managment    COLONOSCOPY     HERNIA REPAIR  2006   x 3    salivary gland removed     left    skin cancer removed from left side of face     basal cell carcinoma   vein removed from left leg     Family History  Problem Relation Age of Onset   Hypertension Mother    Heart disease Mother    Varicose Veins Mother    Heart attack Father    Arthritis Father    COPD Father    Hearing loss Father    Stroke Father    Cancer Sister    Cancer Sister    Social History   Socioeconomic History   Marital status: Married    Spouse name: Verdis Frederickson   Number of children: 3   Years of education: 14   Highest education level: Associate degree: academic program  Occupational History   Occupation: Retired.  Tobacco Use   Smoking status: Former    Packs/day: 0.25    Years: 30.00    Pack years: 7.50    Types: Cigarettes, Pipe, Cigars    Quit date: 11/24/2011    Years since quitting: 8.8   Smokeless tobacco: Former    Quit date: 02/15/2011   Tobacco comments:    [place].  Vaping Use   Vaping Use: Never used  Substance and Sexual Activity   Alcohol use: Not Currently    Comment: one glass wine periodically   Drug use: No   Sexual activity: Not Currently  Other Topics Concern   Not on file  Social History Narrative   Lives with his wife. He enjoys playing games on the computer and playing cards with his wife for about two hours a day. Due to a recent heart attack, he is not able to play golf anymore.   Social Determinants of Health   Financial Resource Strain: Low Risk    Difficulty of Paying Living Expenses: Not hard at all  Food Insecurity: No Food Insecurity   Worried About Charity fundraiser in the Last Year: Never true   Arboriculturist in  the Last Year:  Never true  Transportation Needs: No Transportation Needs   Lack of Transportation (Medical): No   Lack of Transportation (Non-Medical): No  Physical Activity: Inactive   Days of Exercise per Week: 0 days   Minutes of Exercise per Session: 0 min  Stress: No Stress Concern Present   Feeling of Stress : Not at all  Social Connections: Moderately Isolated   Frequency of Communication with Friends and Family: Twice a week   Frequency of Social Gatherings with Friends and Family: Never   Attends Religious Services: 1 to 4 times per year   Active Member of [employer] or Organizations: No   Attends Archivist Meetings: Never   Marital Status: Married    Activities of Daily Living In your present state of health, do you have any difficulty performing the following activities: 09/16/2020 03/05/2020  Hearing? N N  Vision? N N  Difficulty concentrating or making decisions? N N  Walking or climbing stairs? N N  Dressing or bathing? N N  Doing errands, shopping? N -  Preparing Food and eating ? N -  Using the Toilet? N -  In the past six months, have you accidently leaked urine? N -  Do you have problems with loss of bowel control? N -  Managing your Medications? N -  Managing your Finances? N -  Housekeeping or managing your Housekeeping? N -  Some recent data might be hidden    Patient Education/ Literacy How often do you need to have someone help you when you read instructions, pamphlets, or other written materials from your doctor or pharmacy?: 1 - Never What is the last grade level you completed in school?: Associates Degree  Exercise Current Exercise Habits: The patient does not participate in regular exercise at present, Exercise limited by: cardiac condition(s)  Diet Patient reports consuming 2 meals a day and 1 snack(s) a day Patient reports that his primary diet is: Regular Patient reports that she does have regular access to food.   Depression Screen PHQ 2/9 Scores  09/16/2020 10/23/2019 10/08/2018 12/26/2017 12/07/2016 03/09/2015 09/12/2014  PHQ - 2 Score 0 0 1 0 0 0 0     Fall Risk Fall Risk  09/16/2020 01/08/2019 12/29/2017 12/07/2016 01/21/2016  Falls in the past year? 1 0 0 No No  Comment - Emmi Telephone Survey: data to providers prior to load Emmi Telephone Survey: data to providers prior to load - -  Number falls in past yr: 0 - - - -  Injury with Fall? 0 - - - -  Risk for fall due to : Impaired balance/gait;History of fall(s) - - - -  Follow up Falls evaluation completed;Education provided - - - -     Objective:  [name] seemed alert and oriented and he participated appropriately during our telephone visit.  Blood Pressure Weight BMI  BP Readings from Last 3 Encounters:  09/07/20 (!) 112/57  08/24/20 (!) 126/52  08/12/20 110/68   Wt Readings from Last 3 Encounters:  08/24/20 213 lb 12.8 oz (97 kg)  08/12/20 212 lb 9.6 oz (96.4 kg)  08/07/20 212 lb 1.3 oz (96.2 kg)   BMI Readings from Last 1 Encounters:  08/24/20 29.00 kg/m    *Unable to obtain current vital signs, weight, and BMI due to telephone visit type  Hearing/Vision  Amadi did not seem to have difficulty with hearing/understanding during the telephone conversation Reports that he has had a formal eye exam by an eye care  professional within the past year Reports that he has not had a formal hearing evaluation within the past year *Unable to fully assess hearing and vision during telephone visit type  Cognitive Function: 6CIT Screen 09/16/2020  What Year? 0 points  What month? 0 points  What time? 0 points  Count back from 20 0 points  Months in reverse 0 points  Repeat phrase 0 points  Total Score 0   (Normal:0-7, Significant for Dysfunction: >8)  Normal Cognitive Function Screening: Yes   Immunization & Health Maintenance Record Immunization History  Administered Date(s) Administered   DTaP 04/04/2016, 06/03/2016   DTaP / HiB / IPV 04/12/2017   Fluad  Quad(high Dose 65+) 10/08/2018, 11/21/2019   Hepatitis B, adult 04/04/2016, 06/03/2016, 04/12/2017   HiB (PRP-T) 04/04/2016, 06/03/2016   IPV 04/04/2016, 06/03/2016   Influenza Inj Mdck Quad Pf 01/06/2012   Influenza, High Dose Seasonal PF 12/07/2016, 11/14/2017   Influenza, Seasonal, Injecte, Preservative Fre 12/27/2013, 12/01/2014   Influenza,inj,Quad PF,6+ Mos 12/27/2013, 12/01/2014, 12/02/2015   Influenza-Unspecified 01/06/2012, 12/01/2014   PFIZER(Purple Top)SARS-COV-2 Vaccination 02/25/2019, 03/17/2019, 10/12/2019   Pneumococcal Conjugate-13 10/07/2015, 04/04/2016, 06/03/2016   Pneumococcal Polysaccharide-23 12/27/2013, 04/12/2017    Health Maintenance  Topic Date Due   COVID-19 Vaccine (4 - Booster for [place] series) 10/02/2020 (Originally 01/12/2020)   HEMOGLOBIN A1C  10/17/2020 (Originally 06/18/2017)   INFLUENZA VACCINE  11/02/2020 (Originally 09/14/2020)   Zoster Vaccines- Shingrix (1 of 2) 12/17/2020 (Originally 11/22/1959)   FOOT EXAM  09/16/2021 (Originally 10/08/2019)   Hepatitis C Screening  09/16/2021 (Originally 11/22/1958)   OPHTHALMOLOGY EXAM  08/21/2021   TETANUS/TDAP  05/16/2026   PNA vac Low Risk Adult  Completed   HPV VACCINES  Aged Out       Assessment  This is a routine wellness examination for [facility].  Health Maintenance: Due or Overdue There are no preventive care reminders to display for this patient.  [name] does not need a referral for Community Assistance: Care Management:   no Social Work:    no Prescription Assistance:  no Nutrition/Diabetes Education:  no   Plan:  Personalized Goals  Goals Addressed               This Visit's Progress     Patient Stated (pt-stated)        Would like to loose about 15 lbs.        Personalized Health Maintenance & Screening Recommendations  Influenza vaccine Shingles vaccine Foot exam Hemoglobin A1C  Patient would like to discuss shingles vaccine, foot exam and hemoglobin A1c  with Dr. Dianah Field at his next appointment.   Lung Cancer Screening Recommended: no (Low Dose CT Chest recommended if Age 108-80 years, 30 pack-year currently smoking OR have quit w/in past 15 years) Hepatitis C Screening recommended: yes HIV Screening recommended: no  Advanced Directives: Written information was not prepared per patient's request.  Referrals & Orders No orders of the defined types were placed in this encounter.   Follow-up Plan Follow-up with Silverio Decamp, MD as planned Patient would like to discuss shingles vaccine, foot exam and hemoglobin A1c with Dr. Dianah Field at his next appointment.  Medicare wellness in one year.  Patient will access his AVS on my chart.   I have personally reviewed and noted the following in the patient's chart:   Medical and social history Use of alcohol, tobacco or illicit drugs  Current medications and supplements Functional ability and status Nutritional status Physical activity Advanced directives List of other  physicians Hospitalizations, surgeries, and ER visits in previous 12 months Vitals Screenings to include cognitive, depression, and falls Referrals and appointments  In addition, I have reviewed and discussed with [name] certain preventive protocols, quality metrics, and best practice recommendations. A written personalized care plan for preventive services as well as general preventive health recommendations is available and can be mailed to the patient at his request.      Tinnie Gens, RN  09/16/2020

## 2020-09-16 NOTE — Patient Instructions (Addendum)
[facility] Maintenance Summary and Written Plan of Care  [name] ,  Thank you for allowing me to perform your Medicare Annual Wellness Visit and for your ongoing commitment to your health.   Health Maintenance & Immunization History Health Maintenance  Topic Date Due  . COVID-19 Vaccine (4 - Booster for [place] series) 10/02/2020 (Originally 01/12/2020)  . HEMOGLOBIN A1C  10/17/2020 (Originally 06/18/2017)  . INFLUENZA VACCINE  11/02/2020 (Originally 09/14/2020)  . Zoster Vaccines- Shingrix (1 of 2) 12/17/2020 (Originally 11/22/1959)  . FOOT EXAM  09/16/2021 (Originally 10/08/2019)  . Hepatitis C Screening  09/16/2021 (Originally 11/22/1958)  . OPHTHALMOLOGY EXAM  08/21/2021  . TETANUS/TDAP  05/16/2026  . PNA vac Low Risk Adult  Completed  . HPV VACCINES  Aged Out   Immunization History  Administered Date(s) Administered  . DTaP 04/04/2016, 06/03/2016  . DTaP / HiB / IPV 04/12/2017  . Fluad Quad(high Dose 65+) 10/08/2018, 11/21/2019  . Hepatitis B, adult 04/04/2016, 06/03/2016, 04/12/2017  . HiB (PRP-T) 04/04/2016, 06/03/2016  . IPV 04/04/2016, 06/03/2016  . Influenza Inj Mdck Quad Pf 01/06/2012  . Influenza, High Dose Seasonal PF 12/07/2016, 11/14/2017  . Influenza, Seasonal, Injecte, Preservative Fre 12/27/2013, 12/01/2014  . Influenza,inj,Quad PF,6+ Mos 12/27/2013, 12/01/2014, 12/02/2015  . Influenza-Unspecified 01/06/2012, 12/01/2014  . PFIZER(Purple Top)SARS-COV-2 Vaccination 02/25/2019, 03/17/2019, 10/12/2019  . Pneumococcal Conjugate-13 10/07/2015, 04/04/2016, 06/03/2016  . Pneumococcal Polysaccharide-23 12/27/2013, 04/12/2017    These are the patient goals that we discussed:  Goals Addressed               This Visit's Progress   .  Patient Stated (pt-stated)        Would like to loose about 15 lbs.          This is a list of Health Maintenance Items that are overdue or due now: Influenza vaccine Shingles vaccine Foot  exam Hemoglobin A1C  Patient would like to discuss shingles vaccine, foot exam and hemoglobin A1c with Dr. Dianah Field at his next appointment.   Orders/Referrals Placed Today: No orders of the defined types were placed in this encounter.  (Contact our referral department at [phone] if you have not spoken with someone about your referral appointment within the next 5 days)    Follow-up Plan Follow-up with Silverio Decamp, MD as planned Patient would like to discuss shingles vaccine, foot exam and hemoglobin A1c with Dr. Dianah Field at his next appointment.  Medicare wellness in one year.  Patient will access his AVS on my chart.  Health Maintenance, Male Adopting a healthy lifestyle and getting preventive care are important in promoting health and wellness. Ask your health care provider about: The right schedule for you to have regular tests and exams. Things you can do on your own to prevent diseases and keep yourself healthy. What should I know about diet, weight, and exercise? Eat a healthy diet  Eat a diet that includes plenty of vegetables, fruits, low-fat dairy products, and lean protein. Do not eat a lot of foods that are high in solid fats, added sugars, or sodium.  Maintain a healthy weight Body mass index (BMI) is a measurement that can be used to identify possible weight problems. It estimates body fat based on height and weight. Your health care provider can help determine your BMI and help you achieve or maintain ahealthy weight. Get regular exercise Get regular exercise. This is one of the most important things you can do for your health. Most adults should: Exercise for  at least 150 minutes each week. The exercise should increase your heart rate and make you sweat (moderate-intensity exercise). Do strengthening exercises at least twice a week. This is in addition to the moderate-intensity exercise. Spend less time sitting. Even light physical activity can  be beneficial. Watch cholesterol and blood lipids Have your blood tested for lipids and cholesterol at 80 years of age, then havethis test every 5 years. You may need to have your cholesterol levels checked more often if: Your lipid or cholesterol levels are high. You are older than 80 years of age. You are at high risk for heart disease. What should I know about cancer screening? Many types of cancers can be detected early and may often be prevented. Depending on your health history and family history, you may need to have cancer screening at various ages. This may include screening for: Colorectal cancer. Prostate cancer. Skin cancer. Lung cancer. What should I know about heart disease, diabetes, and high blood pressure? Blood pressure and heart disease High blood pressure causes heart disease and increases the risk of stroke. This is more likely to develop in people who have high blood pressure readings, are of African descent, or are overweight. Talk with your health care provider about your target blood pressure readings. Have your blood pressure checked: Every 3-5 years if you are 75-1 years of age. Every year if you are 77 years old or older. If you are between the ages of 22 and 44 and are a current or former smoker, ask your health care provider if you should have a one-time screening for abdominal aortic aneurysm (AAA). Diabetes Have regular diabetes screenings. This checks your fasting blood sugar level. Have the screening done: Once every three years after age 27 if you are at a normal weight and have a low risk for diabetes. More often and at a younger age if you are overweight or have a high risk for diabetes. What should I know about preventing infection? Hepatitis B If you have a higher risk for hepatitis B, you should be screened for this virus. Talk with your health care provider to find out if you are at risk forhepatitis B infection. Hepatitis C Blood testing is  recommended for: Everyone born from [birth year] through 1965. Anyone with known risk factors for hepatitis C. Sexually transmitted infections (STIs) You should be screened each year for STIs, including gonorrhea and chlamydia, if: You are sexually active and are younger than 80 years of age. You are older than 80 years of age and your health care provider tells you that you are at risk for this type of infection. Your sexual activity has changed since you were last screened, and you are at increased risk for chlamydia or gonorrhea. Ask your health care provider if you are at risk. Ask your health care provider about whether you are at high risk for HIV. Your health care provider may recommend a prescription medicine to help prevent HIV infection. If you choose to take medicine to prevent HIV, you should first get tested for HIV. You should then be tested every 3 months for as long as you are taking the medicine. Follow these instructions at home: Lifestyle Do not use any products that contain nicotine or tobacco, such as cigarettes, e-cigarettes, and chewing tobacco. If you need help quitting, ask your health care provider. Do not use street drugs. Do not share needles. Ask your health care provider for help if you need support or information about quitting drugs.  Alcohol use Do not drink alcohol if your health care provider tells you not to drink. If you drink alcohol: Limit how much you have to 0-2 drinks a day. Be aware of how much alcohol is in your drink. In the [place] one drink equals one 12 oz bottle of beer (355 mL), one 5 oz glass of wine (148 mL), or one 1 oz glass of hard liquor (44 mL). General instructions Schedule regular health, dental, and eye exams. Stay current with your vaccines. Tell your health care provider if: You often feel depressed. You have ever been abused or do not feel safe at home. Summary Adopting a healthy lifestyle and getting preventive care are important in  promoting health and wellness. Follow your health care provider's instructions about healthy diet, exercising, and getting tested or screened for diseases. Follow your health care provider's instructions on monitoring your cholesterol and blood pressure. This information is not intended to replace advice given to you by your health care provider. Make sure you discuss any questions you have with your healthcare provider. Document Revised: 01/24/2018 Document Reviewed: 01/24/2018 Elsevier Patient Education  2022 [employer].

## 2020-09-21 ENCOUNTER — Ambulatory Visit: Payer: Medicare Other

## 2020-09-21 ENCOUNTER — Ambulatory Visit: Payer: Medicare Other | Admitting: Hematology & Oncology

## 2020-09-21 ENCOUNTER — Other Ambulatory Visit: Payer: Medicare Other

## 2020-09-21 DIAGNOSIS — Q828 Other specified congenital malformations of skin: Secondary | ICD-10-CM | POA: Diagnosis not present

## 2020-09-21 DIAGNOSIS — D229 Melanocytic nevi, unspecified: Secondary | ICD-10-CM | POA: Diagnosis not present

## 2020-09-21 DIAGNOSIS — Z872 Personal history of diseases of the skin and subcutaneous tissue: Secondary | ICD-10-CM | POA: Diagnosis not present

## 2020-09-21 DIAGNOSIS — Z85828 Personal history of other malignant neoplasm of skin: Secondary | ICD-10-CM | POA: Diagnosis not present

## 2020-09-21 DIAGNOSIS — L578 Other skin changes due to chronic exposure to nonionizing radiation: Secondary | ICD-10-CM | POA: Diagnosis not present

## 2020-09-21 DIAGNOSIS — L219 Seborrheic dermatitis, unspecified: Secondary | ICD-10-CM | POA: Diagnosis not present

## 2020-09-21 DIAGNOSIS — L570 Actinic keratosis: Secondary | ICD-10-CM | POA: Diagnosis not present

## 2020-09-22 ENCOUNTER — Inpatient Hospital Stay: Payer: Medicare Other

## 2020-09-22 ENCOUNTER — Inpatient Hospital Stay: Payer: Medicare Other | Attending: Hematology & Oncology

## 2020-09-22 ENCOUNTER — Other Ambulatory Visit: Payer: Self-pay

## 2020-09-22 ENCOUNTER — Inpatient Hospital Stay: Payer: Medicare Other | Admitting: Hematology & Oncology

## 2020-09-22 ENCOUNTER — Encounter: Payer: Self-pay | Admitting: Hematology & Oncology

## 2020-09-22 VITALS — BP 108/56 | HR 71 | Temp 97.8°F | Resp 18 | Wt 216.5 lb

## 2020-09-22 DIAGNOSIS — C9000 Multiple myeloma not having achieved remission: Secondary | ICD-10-CM

## 2020-09-22 DIAGNOSIS — M899 Disorder of bone, unspecified: Secondary | ICD-10-CM

## 2020-09-22 DIAGNOSIS — Z5112 Encounter for antineoplastic immunotherapy: Secondary | ICD-10-CM | POA: Diagnosis not present

## 2020-09-22 DIAGNOSIS — D500 Iron deficiency anemia secondary to blood loss (chronic): Secondary | ICD-10-CM

## 2020-09-22 DIAGNOSIS — C9002 Multiple myeloma in relapse: Secondary | ICD-10-CM | POA: Insufficient documentation

## 2020-09-22 LAB — CBC WITH DIFFERENTIAL (CANCER CENTER ONLY)
Abs Immature Granulocytes: 0.01 10*3/uL (ref 0.00–0.07)
Basophils Absolute: 0 10*3/uL (ref 0.0–0.1)
Basophils Relative: 0 %
Eosinophils Absolute: 0 10*3/uL (ref 0.0–0.5)
Eosinophils Relative: 0 %
HCT: 36.7 % — ABNORMAL LOW (ref 39.0–52.0)
Hemoglobin: 12.2 g/dL — ABNORMAL LOW (ref 13.0–17.0)
Immature Granulocytes: 0 %
Lymphocytes Relative: 30 %
Lymphs Abs: 1.6 10*3/uL (ref 0.7–4.0)
MCH: 29.8 pg (ref 26.0–34.0)
MCHC: 33.2 g/dL (ref 30.0–36.0)
MCV: 89.5 fL (ref 80.0–100.0)
Monocytes Absolute: 0.6 10*3/uL (ref 0.1–1.0)
Monocytes Relative: 11 %
Neutro Abs: 3.1 10*3/uL (ref 1.7–7.7)
Neutrophils Relative %: 59 %
Platelet Count: 126 10*3/uL — ABNORMAL LOW (ref 150–400)
RBC: 4.1 MIL/uL — ABNORMAL LOW (ref 4.22–5.81)
RDW: 14.9 % (ref 11.5–15.5)
WBC Count: 5.4 10*3/uL (ref 4.0–10.5)
nRBC: 0 % (ref 0.0–0.2)

## 2020-09-22 LAB — IRON AND TIBC
Iron: 81 ug/dL (ref 42–163)
Saturation Ratios: 28 % (ref 20–55)
TIBC: 293 ug/dL (ref 202–409)
UIBC: 211 ug/dL (ref 117–376)

## 2020-09-22 LAB — CMP (CANCER CENTER ONLY)
ALT: 14 U/L (ref 0–44)
AST: 13 U/L — ABNORMAL LOW (ref 15–41)
Albumin: 3.7 g/dL (ref 3.5–5.0)
Alkaline Phosphatase: 83 U/L (ref 38–126)
Anion gap: 9 (ref 5–15)
BUN: 18 mg/dL (ref 8–23)
CO2: 26 mmol/L (ref 22–32)
Calcium: 9.1 mg/dL (ref 8.9–10.3)
Chloride: 104 mmol/L (ref 98–111)
Creatinine: 1.15 mg/dL (ref 0.61–1.24)
GFR, Estimated: >60 mL/min (ref >60)
Glucose, Bld: 96 mg/dL (ref 70–99)
Potassium: 3.7 mmol/L (ref 3.5–5.1)
Sodium: 139 mmol/L (ref 135–145)
Total Bilirubin: 0.6 mg/dL (ref 0.3–1.2)
Total Protein: 5.6 g/dL — ABNORMAL LOW (ref 6.5–8.1)

## 2020-09-22 LAB — FERRITIN: Ferritin: 68 ng/mL (ref 24–336)

## 2020-09-22 LAB — LACTATE DEHYDROGENASE: LDH: 121 U/L (ref 98–192)

## 2020-09-22 MED ORDER — PEGFILGRASTIM-JMDB 6 MG/0.6ML ~~LOC~~ SOSY
PREFILLED_SYRINGE | SUBCUTANEOUS | Status: AC
  Filled 2020-09-22: qty 0.6

## 2020-09-22 MED ORDER — DARATUMUMAB-HYALURONIDASE-FIHJ 1800-30000 MG-UT/15ML ~~LOC~~ SOLN
1800.0000 mg | Freq: Once | SUBCUTANEOUS | Status: AC
  Administered 2020-09-22: 1800 mg via SUBCUTANEOUS
  Filled 2020-09-22: qty 15

## 2020-09-22 MED ORDER — DIPHENHYDRAMINE HCL 25 MG PO CAPS: 50.0000 mg | ORAL_CAPSULE | Freq: Once | ORAL | Status: DC

## 2020-09-22 MED ORDER — DEXAMETHASONE 4 MG PO TABS: 20.0000 mg | ORAL_TABLET | Freq: Once | ORAL | Status: DC

## 2020-09-22 MED ORDER — ACETAMINOPHEN 325 MG PO TABS: 650.0000 mg | ORAL_TABLET | Freq: Once | ORAL | Status: DC

## 2020-09-22 MED ORDER — PROCHLORPERAZINE MALEATE 10 MG PO TABS: 10.0000 mg | ORAL_TABLET | Freq: Once | ORAL | Status: DC

## 2020-09-22 NOTE — Patient Instructions (Signed)
[facility]  Discharge Instructions: Thank you for choosing [facility] to provide your oncology and hematology care.   If you have a lab appointment with the [facility], please go directly to the [facility] and check in at the registration area.  Wear comfortable clothing and clothing appropriate for easy access to any Portacath or PICC line.   We strive to give you quality time with your provider. You may need to reschedule your appointment if you arrive late (15 or more minutes).  Arriving late affects you and other patients whose appointments are after yours.  Also, if you miss three or more appointments without notifying the office, you may be dismissed from the clinic at the provider's discretion.      For prescription refill requests, have your pharmacy contact our office and allow 72 hours for refills to be completed.    Today you received the following chemotherapy and/or immunotherapy agents Faspro      To help prevent nausea and vomiting after your treatment, we encourage you to take your nausea medication as directed.  BELOW ARE SYMPTOMS THAT SHOULD BE REPORTED IMMEDIATELY: *FEVER GREATER THAN 100.4 F (38 C) OR HIGHER *CHILLS OR SWEATING *NAUSEA AND VOMITING THAT IS NOT CONTROLLED WITH YOUR NAUSEA MEDICATION *UNUSUAL SHORTNESS OF BREATH *UNUSUAL BRUISING OR BLEEDING *URINARY PROBLEMS (pain or burning when urinating, or frequent urination) *BOWEL PROBLEMS (unusual diarrhea, constipation, pain near the anus) TENDERNESS IN MOUTH AND THROAT WITH OR WITHOUT PRESENCE OF ULCERS (sore throat, sores in mouth, or a toothache) UNUSUAL RASH, SWELLING OR PAIN  UNUSUAL VAGINAL DISCHARGE OR ITCHING   Items with * indicate a potential emergency and should be followed up as soon as possible or go to the Emergency Department if any problems should occur.  Please show the CHEMOTHERAPY ALERT CARD or IMMUNOTHERAPY ALERT CARD at check-in to the  Emergency Department and triage nurse. Should you have questions after your visit or need to cancel or reschedule your appointment, please contact [facility]  [phone] and follow the prompts.  Office hours are 8:00 a.m. to 4:30 p.m. Monday - Friday. Please note that voicemails left after 4:00 p.m. may not be returned until the following business day.  We are closed weekends and major holidays. You have access to a nurse at all times for urgent questions. Please call the main number to the clinic [phone] and follow the prompts.  For any non-urgent questions, you may also contact your provider using MyChart. We now offer e-Visits for anyone 59 and older to request care online for non-urgent symptoms. For details visit [email or web address].   Also download the MyChart app! Go to the app store, search "MyChart", open the app, select [facility], and log in with your MyChart username and password.  Due to Covid, a mask is required upon entering the hospital/clinic. If you do not have a mask, one will be given to you upon arrival. For doctor visits, patients may have 1 support person aged 60 or older with them. For treatment visits, patients cannot have anyone with them due to current Covid guidelines and our immunocompromised population.

## 2020-09-22 NOTE — Progress Notes (Unsigned)
Hematology and Oncology Follow Up Visit  [name] [name] UH:[id number] 02-Jan-1941 80 y.o. 09/22/2020   Principle Diagnosis:  IgG kappa myeloma - Relapsed -- 1q duplication Acute thromboembolism of the right lower leg   Past Therapy:        S/P ASCT at [facility] on 04/02/2015 Patient s/p cycle 5 of Velcade/Revlimid/Decadron Revlimid '10mg'$  po q day (21/28) -- d/c on 09/15/2017   Current Therapy:     Faspro/Velcade/Decadron -- s/p cycle #6- start on 03/20/2020   -- d/c velcade and start Kyprolis on 09/27/2020 Zometa 4 mg IV every 3 months  - next dose due 10/2020 Xarelto 10 mg by mouth daily   Interim History:  [name] [name] is here today for follow-up.  Unfortunately, it looks like he started to "breakthrough" on the Faspro/Velcade protocol.  He is kappa light chains are going up.  When we last checked the Kappa light chain back in July, they were updated 15.2 mg/dL.  Prior to this, there were 12.2 mg/dL.  I think that we are going to have to make a change.  I think the best thing to do would be to take out the Velcade and substitute Kyprolis.  With a problem with this, is that he has had a recent myocardial infarction.  We will have to be very cautious with the Kyprolis dosing.  We had to titrate up slowly.  I think we can do this safely.  We will have to watch the fluid volume.  Otherwise he seems to be doing okay.  He has had no heart trouble.  He has had no chest pain.  There is been no evidence of obvious congestive heart failure.  He is on medication for the heart.  I think he is on Entresto.  He has had no diarrhea.  He has had no nausea or vomiting.  He is eating okay.  He has had no bleeding.  There has been no fever.  He has had no leg swelling.  Overall, his performance status is ECOG 1.    Medications:  Allergies as of 09/22/2020       Reactions   Budesonide-formoterol Fumarate Itching, Other (See Comments)   Codeine Itching   Hydrocodone-acetaminophen Itching   Mushroom Extract  Complex Nausea And Vomiting, Other (See Comments)   Only shitake mushroom  causes this reaction. Flu-like symptoms from portabello mushrooms        Medication List        Accurate as of September 22, 2020 11:20 AM. If you have any questions, ask your nurse or doctor.          STOP taking these medications    rosuvastatin 40 MG tablet Commonly known as: CRESTOR Stopped by: [name], MD       TAKE these medications    benzonatate 200 MG capsule Commonly known as: TESSALON Take 1 capsule (200 mg total) by mouth 3 (three) times daily as needed for cough.   cetirizine 10 MG tablet Commonly known as: EQ Allergy Relief (Cetirizine) Take 1 tablet (10 mg total) by mouth daily.   clopidogrel 75 MG tablet Commonly known as: PLAVIX Take 75 mg by mouth daily.   Cyanocobalamin 1000 MCG Tbcr Take 1 tablet by mouth daily.   dexamethasone 4 MG tablet Commonly known as: DECADRON TAKE 5 TABLETS (20 MG TOTAL) BY MOUTH SEE ADMIN INSTRUCTIONS (TAKE 5 TABLETS ONCE ON THE DAY OF TREATMENT)   esomeprazole 40 MG capsule Commonly known as: NEXIUM Take 1 capsule (40 mg  total) by mouth daily at 12 noon.   famciclovir 500 MG tablet Commonly known as: FAMVIR Take 1 tablet (500 mg total) by mouth daily.   fluticasone 50 MCG/ACT nasal spray Commonly known as: FLONASE Place 2 sprays into both nostrils daily.   furosemide 20 MG tablet Commonly known as: LASIX Take 20 mg by mouth daily as needed.   gabapentin 600 MG tablet Commonly known as: NEURONTIN Take 1 tablet (600 mg total) by mouth daily.   hydrocortisone 2.5 % ointment Apply topically 2 (two) times daily.   isosorbide mononitrate 60 MG 24 hr tablet Commonly known as: IMDUR Take by mouth.   levothyroxine 100 MCG tablet Commonly known as: Synthroid Take 1 tablet (100 mcg total) by mouth daily before breakfast.   LORazepam 1 MG tablet Commonly known as: ATIVAN Take 1 mg by mouth at bedtime.   metoprolol succinate  25 MG 24 hr tablet Commonly known as: TOPROL-XL Take 0.5 tablets (12.5 mg total) by mouth daily.   nitroGLYCERIN 0.4 MG SL tablet Commonly known as: NITROSTAT Place 1 tablet (0.4 mg total) under the tongue every 5 (five) minutes as needed. For chest pain   ondansetron 8 MG tablet Commonly known as: Zofran Take 1 tablet (8 mg total) by mouth 2 (two) times daily as needed (Nausea or vomiting).   oxyCODONE-acetaminophen 10-325 MG tablet Commonly known as: PERCOCET Take 1 tablet by mouth every 8 (eight) hours as needed for pain. for pain   potassium chloride SA 20 MEQ tablet Commonly known as: KLOR-CON TAKE 1 TABLET TWICE DAILY   prochlorperazine 10 MG tablet Commonly known as: COMPAZINE Take 1 tablet (10 mg total) by mouth every 6 (six) hours as needed (Nausea or vomiting).   pyridoxine 100 MG tablet Commonly known as: B-6 Take 100 mg by mouth daily.   rivaroxaban 10 MG Tabs tablet Commonly known as: XARELTO Take 1 tablet (10 mg total) by mouth daily with supper.   sacubitril-valsartan 49-51 MG Commonly known as: ENTRESTO Take by mouth 2 (two) times daily.   tamsulosin 0.4 MG Caps capsule Commonly known as: FLOMAX Take 1 capsule (0.4 mg total) by mouth daily after supper.   Zoledronic Acid 4 MG Solr Inject into the vein.        Allergies:  Allergies  Allergen Reactions   Budesonide-Formoterol Fumarate Itching and Other (See Comments)   Codeine Itching   Hydrocodone-Acetaminophen Itching   Mushroom Extract Complex Nausea And Vomiting and Other (See Comments)    Only shitake mushroom  causes this reaction. Flu-like symptoms from portabello mushrooms    Past Medical History, Surgical history, Social history, and Family History were reviewed and updated.  Review of Systems: Review of Systems  HENT: Negative.    Eyes: Negative.   Respiratory: Negative.    Cardiovascular:  Positive for chest pain.  Gastrointestinal: Negative.   Genitourinary: Negative.    Musculoskeletal:  Positive for back pain.  Skin: Negative.   Neurological: Negative.   Endo/Heme/Allergies: Negative.   Psychiatric/Behavioral: Negative.      Physical Exam:  weight is 216 lb 8 oz (98.2 kg). His oral temperature is 97.8 F (36.6 C). His blood pressure is 108/56 (abnormal) and his pulse is 71. His respiration is 18 and oxygen saturation is 97%.   Wt Readings from Last 3 Encounters:  09/22/20 216 lb 8 oz (98.2 kg)  08/24/20 213 lb 12.8 oz (97 kg)  08/12/20 212 lb 9.6 oz (96.4 kg)    Physical Exam Vitals reviewed.  HENT:  Head: Normocephalic and atraumatic.  Eyes:     Pupils: Pupils are equal, round, and reactive to light.  Cardiovascular:     Rate and Rhythm: Normal rate and regular rhythm.     Heart sounds: Normal heart sounds.  Pulmonary:     Effort: Pulmonary effort is normal.     Breath sounds: Normal breath sounds.  Abdominal:     General: Bowel sounds are normal.     Palpations: Abdomen is soft.  Musculoskeletal:        General: No tenderness or deformity. Normal range of motion.     Cervical back: Normal range of motion.  Lymphadenopathy:     Cervical: No cervical adenopathy.  Skin:    General: Skin is warm and dry.     Findings: No erythema or rash.  Neurological:     Mental Status: He is alert and oriented to person, place, and time.  Psychiatric:        Behavior: Behavior normal.        Thought Content: Thought content normal.        Judgment: Judgment normal.     Lab Results  Component Value Date   WBC 5.4 09/22/2020   HGB 12.2 (L) 09/22/2020   HCT 36.7 (L) 09/22/2020   MCV 89.5 09/22/2020   PLT 126 (L) 09/22/2020   Lab Results  Component Value Date   FERRITIN 134 08/24/2020   IRON 55 08/24/2020   TIBC 309 08/24/2020   UIBC 254 08/24/2020   IRONPCTSAT 18 (L) 08/24/2020   Lab Results  Component Value Date   RETICCTPCT 1.7 07/18/2019   RBC 4.10 (L) 09/22/2020   Lab Results  Component Value Date   KPAFRELGTCHN 151.5  (H) 08/24/2020   LAMBDASER 3.1 (L) 08/24/2020   KAPLAMBRATIO 48.87 (H) 08/24/2020   Lab Results  Component Value Date   IGGSERUM 506 (L) 08/24/2020   IGGSERUM 555 (L) 08/24/2020   IGA 15 (L) 08/24/2020   IGA 17 (L) 08/24/2020   IGMSERUM 17 08/24/2020   IGMSERUM 17 08/24/2020   Lab Results  Component Value Date   TOTALPROTELP 6.0 08/24/2020   ALBUMINELP 3.7 08/24/2020   A1GS 0.2 08/24/2020   A2GS 0.9 08/24/2020   BETS 0.9 08/24/2020   BETA2SER 0.3 02/11/2015   GAMS 0.3 (L) 08/24/2020   MSPIKE 0.2 (H) 08/24/2020   SPEI Comment 10/30/2019     Chemistry      Component Value Date/Time   NA 139 09/22/2020 0955   NA 145 (H) 06/28/2019 1501   NA 140 01/27/2017 1044   NA 141 06/05/2015 1036   K 3.7 09/22/2020 0955   K 3.3 01/27/2017 1044   K 2.9 (LL) 06/05/2015 1036   CL 104 09/22/2020 0955   CL 101 01/27/2017 1044   CO2 26 09/22/2020 0955   CO2 28 01/27/2017 1044   CO2 27 06/05/2015 1036   BUN 18 09/22/2020 0955   BUN 15 06/28/2019 1501   BUN 14 01/27/2017 1044   BUN 13.1 06/05/2015 1036   CREATININE 1.15 09/22/2020 0955   CREATININE 1.16 10/08/2018 1531   CREATININE 1.0 06/05/2015 1036   GLU 152 03/30/2016 0000      Component Value Date/Time   CALCIUM 9.1 09/22/2020 0955   CALCIUM 8.9 01/27/2017 1044   CALCIUM 9.4 06/05/2015 1036   ALKPHOS 83 09/22/2020 0955   ALKPHOS 126 (H) 01/27/2017 1044   ALKPHOS 94 06/05/2015 1036   AST 13 (L) 09/22/2020 0955   AST 22 06/05/2015 1036  ALT 14 09/22/2020 0955   ALT 33 01/27/2017 1044   ALT 18 06/05/2015 1036   BILITOT 0.6 09/22/2020 0955   BILITOT 0.59 06/05/2015 1036       Impression and Plan: Mr. Gantt is a very pleasant 80 yo caucasian gentleman with history of IgG kappa myeloma.  He underwent standard induction chemotherapy and then ultimately stem cell transplant at [facility] in February 2017.   I need to make a change with his protocol.  He was doing nicely.  However, he now is becoming more resistant.  I think  that we can put him on Kyprolis, this should do okay for [place].  I think this should help with respect to a response.  I talked to he and his wife about this.  I explained why we had to make a change.  I explained that we had to be careful with the Kyprolis and start at a low dose and then gradually titrate up.  They understand this.  I think the other options that we could try would be Blenrep.  However, this might be more of a aggravation because of all the eye exams that he needs.  I do not think that he would be a candidate for CAR -T therapy because of the myocardial infarction.  Hopefully, his heart will recover.  Hopefully his ejection fraction will improve over time.  We will give him Faspro today.  We will have to make the change with the Kyprolis.  We probably will have to start this next week.  I will plan to see him back myself in about a month.     [name], MD 8/9/202211:20 AM

## 2020-09-23 ENCOUNTER — Encounter: Payer: Self-pay | Admitting: Hematology & Oncology

## 2020-09-23 LAB — IGG, IGA, IGM
IgA: 13 mg/dL — ABNORMAL LOW (ref 61–437)
IgG (Immunoglobin G), Serum: 497 mg/dL — ABNORMAL LOW (ref 603–1613)
IgM (Immunoglobulin M), Srm: 18 mg/dL (ref 15–143)

## 2020-09-23 LAB — KAPPA/LAMBDA LIGHT CHAINS
Kappa free light chain: 190.8 mg/L — ABNORMAL HIGH (ref 3.3–19.4)
Kappa, lambda light chain ratio: 63.6 — ABNORMAL HIGH (ref 0.26–1.65)
Lambda free light chains: 3 mg/L — ABNORMAL LOW (ref 5.7–26.3)

## 2020-09-24 LAB — PROTEIN ELECTROPHORESIS, SERUM
A/G Ratio: 1.5 (ref 0.7–1.7)
Albumin ELP: 3.3 g/dL (ref 2.9–4.4)
Alpha-1-Globulin: 0.2 g/dL (ref 0.0–0.4)
Alpha-2-Globulin: 0.8 g/dL (ref 0.4–1.0)
Beta Globulin: 0.9 g/dL (ref 0.7–1.3)
Gamma Globulin: 0.4 g/dL (ref 0.4–1.8)
Globulin, Total: 2.2 g/dL (ref 2.2–3.9)
M-Spike, %: 0.1 g/dL — ABNORMAL HIGH
Total Protein ELP: 5.5 g/dL — ABNORMAL LOW (ref 6.0–8.5)

## 2020-09-29 ENCOUNTER — Other Ambulatory Visit: Payer: Self-pay

## 2020-09-29 ENCOUNTER — Inpatient Hospital Stay: Payer: Medicare Other

## 2020-09-29 VITALS — BP 124/64 | HR 56 | Temp 97.8°F | Resp 18

## 2020-09-29 DIAGNOSIS — C9000 Multiple myeloma not having achieved remission: Secondary | ICD-10-CM

## 2020-09-29 DIAGNOSIS — Z5112 Encounter for antineoplastic immunotherapy: Secondary | ICD-10-CM | POA: Diagnosis not present

## 2020-09-29 DIAGNOSIS — C9002 Multiple myeloma in relapse: Secondary | ICD-10-CM | POA: Diagnosis not present

## 2020-09-29 LAB — CBC WITH DIFFERENTIAL/PLATELET
Abs Immature Granulocytes: 0.01 10*3/uL (ref 0.00–0.07)
Basophils Absolute: 0 10*3/uL (ref 0.0–0.1)
Basophils Relative: 0 %
Eosinophils Absolute: 0 10*3/uL (ref 0.0–0.5)
Eosinophils Relative: 1 %
HCT: 38.5 % — ABNORMAL LOW (ref 39.0–52.0)
Hemoglobin: 12.6 g/dL — ABNORMAL LOW (ref 13.0–17.0)
Immature Granulocytes: 0 %
Lymphocytes Relative: 42 %
Lymphs Abs: 1.9 10*3/uL (ref 0.7–4.0)
MCH: 29.6 pg (ref 26.0–34.0)
MCHC: 32.7 g/dL (ref 30.0–36.0)
MCV: 90.4 fL (ref 80.0–100.0)
Monocytes Absolute: 0.5 10*3/uL (ref 0.1–1.0)
Monocytes Relative: 12 %
Neutro Abs: 2.1 10*3/uL (ref 1.7–7.7)
Neutrophils Relative %: 45 %
Platelets: 147 10*3/uL — ABNORMAL LOW (ref 150–400)
RBC: 4.26 MIL/uL (ref 4.22–5.81)
RDW: 15.2 % (ref 11.5–15.5)
WBC: 4.7 10*3/uL (ref 4.0–10.5)
nRBC: 0 % (ref 0.0–0.2)

## 2020-09-29 LAB — COMPREHENSIVE METABOLIC PANEL
ALT: 23 U/L (ref 0–44)
AST: 15 U/L (ref 15–41)
Albumin: 3.9 g/dL (ref 3.5–5.0)
Alkaline Phosphatase: 88 U/L (ref 38–126)
Anion gap: 7 (ref 5–15)
BUN: 16 mg/dL (ref 8–23)
CO2: 27 mmol/L (ref 22–32)
Calcium: 9.3 mg/dL (ref 8.9–10.3)
Chloride: 105 mmol/L (ref 98–111)
Creatinine, Ser: 1.09 mg/dL (ref 0.61–1.24)
GFR, Estimated: >60 mL/min (ref >60)
Glucose, Bld: 86 mg/dL (ref 70–99)
Potassium: 4 mmol/L (ref 3.5–5.1)
Sodium: 139 mmol/L (ref 135–145)
Total Bilirubin: 0.6 mg/dL (ref 0.3–1.2)
Total Protein: 5.5 g/dL — ABNORMAL LOW (ref 6.5–8.1)

## 2020-09-29 MED ORDER — SODIUM CHLORIDE 0.9 % IV SOLN
20.0000 mg | Freq: Once | INTRAVENOUS | Status: AC
  Administered 2020-09-29: 20 mg via INTRAVENOUS
  Filled 2020-09-29: qty 20

## 2020-09-29 MED ORDER — DEXTROSE 5 % IV SOLN
19.0000 mg/m2 | Freq: Once | INTRAVENOUS | Status: AC
  Administered 2020-09-29: 40 mg via INTRAVENOUS
  Filled 2020-09-29: qty 15

## 2020-09-29 MED ORDER — SODIUM CHLORIDE 0.9 % IV SOLN: Freq: Once | INTRAVENOUS | Status: AC

## 2020-09-29 MED ORDER — SODIUM CHLORIDE 0.9 % IV SOLN: Freq: Once | INTRAVENOUS | Status: DC

## 2020-09-29 NOTE — Patient Instructions (Signed)
[facility]  Discharge Instructions: Thank you for choosing [facility] to provide your oncology and hematology care.   If you have a lab appointment with the [facility], please go directly to the [facility] and check in at the registration area.  Wear comfortable clothing and clothing appropriate for easy access to any Portacath or PICC line.   We strive to give you quality time with your provider. You may need to reschedule your appointment if you arrive late (15 or more minutes).  Arriving late affects you and other patients whose appointments are after yours.  Also, if you miss three or more appointments without notifying the office, you may be dismissed from the clinic at the provider's discretion.      For prescription refill requests, have your pharmacy contact our office and allow 72 hours for refills to be completed.    Today you received the following chemotherapy and/or immunotherapy agents Kyprolis      To help prevent nausea and vomiting after your treatment, we encourage you to take your nausea medication as directed.  BELOW ARE SYMPTOMS THAT SHOULD BE REPORTED IMMEDIATELY: *FEVER GREATER THAN 100.4 F (38 C) OR HIGHER *CHILLS OR SWEATING *NAUSEA AND VOMITING THAT IS NOT CONTROLLED WITH YOUR NAUSEA MEDICATION *UNUSUAL SHORTNESS OF BREATH *UNUSUAL BRUISING OR BLEEDING *URINARY PROBLEMS (pain or burning when urinating, or frequent urination) *BOWEL PROBLEMS (unusual diarrhea, constipation, pain near the anus) TENDERNESS IN MOUTH AND THROAT WITH OR WITHOUT PRESENCE OF ULCERS (sore throat, sores in mouth, or a toothache) UNUSUAL RASH, SWELLING OR PAIN  UNUSUAL VAGINAL DISCHARGE OR ITCHING   Items with * indicate a potential emergency and should be followed up as soon as possible or go to the Emergency Department if any problems should occur.  Please show the CHEMOTHERAPY ALERT CARD or IMMUNOTHERAPY ALERT CARD at check-in to the  Emergency Department and triage nurse. Should you have questions after your visit or need to cancel or reschedule your appointment, please contact [facility]  [phone] and follow the prompts.  Office hours are 8:00 a.m. to 4:30 p.m. Monday - Friday. Please note that voicemails left after 4:00 p.m. may not be returned until the following business day.  We are closed weekends and major holidays. You have access to a nurse at all times for urgent questions. Please call the main number to the clinic [phone] and follow the prompts.  For any non-urgent questions, you may also contact your provider using MyChart. We now offer e-Visits for anyone 71 and older to request care online for non-urgent symptoms. For details visit [email or web address].   Also download the MyChart app! Go to the app store, search "MyChart", open the app, select [facility], and log in with your MyChart username and password.  Due to Covid, a mask is required upon entering the hospital/clinic. If you do not have a mask, one will be given to you upon arrival. For doctor visits, patients may have 1 support person aged 61 or older with them. For treatment visits, patients cannot have anyone with them due to current Covid guidelines and our immunocompromised population.

## 2020-10-01 ENCOUNTER — Encounter: Payer: Self-pay | Admitting: Family Medicine

## 2020-10-01 ENCOUNTER — Ambulatory Visit (INDEPENDENT_AMBULATORY_CARE_PROVIDER_SITE_OTHER): Payer: Medicare Other | Admitting: Family Medicine

## 2020-10-01 ENCOUNTER — Other Ambulatory Visit: Payer: Self-pay

## 2020-10-01 VITALS — BP 147/72 | HR 54 | Temp 97.8°F | Ht 72.0 in | Wt 212.0 lb

## 2020-10-01 DIAGNOSIS — Z9861 Coronary angioplasty status: Secondary | ICD-10-CM

## 2020-10-01 DIAGNOSIS — Q828 Other specified congenital malformations of skin: Secondary | ICD-10-CM

## 2020-10-01 DIAGNOSIS — I2510 Atherosclerotic heart disease of native coronary artery without angina pectoris: Secondary | ICD-10-CM

## 2020-10-01 NOTE — Progress Notes (Signed)
Acute Office Visit  Subjective:    Patient ID: [name] [name], male    DOB: 1941-01-20, 80 y.o.   MRN: [id number]  Chief Complaint  Patient presents with   Wound Check    HPI Patient is in today for sore on ankle.   Patient was following with dermatology for porokeratosis lesion on right ankle that he first noticed about 6-8 weeks ago. He initially started with topical therapy, but on 09/21/20 they used cryotherapy to the area. Patient states that within a few days, he noticed the area was stinging, aching, and red. His wife started applying Aquaphor and a Band-Aid and he reports it is looking much better now. He wanted to come in today to be sure it didn't look infected since he is still having some soreness. He denies any increase in pain, new rashes/lesions, fevers.     Past Medical History:  Diagnosis Date   Acute deep vein thrombosis (DVT) of tibial vein of right lower extremity (HCC) 09/29/2014   Arthritis    back   Asthma    uses Advair bid and asmanex prn   Cancer ([facility])    multiple myeloma   Cataract 2019   CHF (congestive heart failure) ([facility]) 2019   Chronic back pain    buldging disc and scoliosis   Chronic bronchitis (HCC)    couple of yrs ago   Complication of anesthesia    severe case of hiccups after hernia surgery;required medication   Diverticulosis    Dizziness    related to neck issues   Emphysema    Emphysema of lung ([facility]) 2015   Enlarged prostate    takes Flomax daily   GERD (gastroesophageal reflux disease)    takes PRotonix daily   History of colon polyps    Hyperlipidemia    takes Zocor daily   Hypertension    takes HCTZ,Diovan daily and Isosorbide daily   Itching    down left arm and leg   Joint pain    Macular degeneration    mild,beginning   Multiple myeloma ([place]) 08/13/2014   Myocardial infarction ([facility]) 2010   Neck pain    Neuromuscular disorder ([facility]) 2017   Osteoporosis 2015   Pain in limb 06/20/2012   Pneumonia    hx  of---12+yrs ago   Sleep apnea 2015   Thyroid disease 2017    Past Surgical History:  Procedure Laterality Date   ANTERIOR CERVICAL DECOMP/DISCECTOMY FUSION  01/31/2012   Procedure: ANTERIOR CERVICAL DECOMPRESSION/DISCECTOMY FUSION 1 LEVEL;  Surgeon: Floyce Stakes, MD;  Location: [facility];  Service: Neurosurgery;  Laterality: N/A;  Cervical Five-Six  Anterior Cervical Decompression /Diskectomy /Fusion/Plate   BONE MARROW TRANSPLANT     CARDIAC CATHETERIZATION  2010/2013   06/07/11 [facility]) LAD 25%, D2 75% (small), pLCx 100%, RCA 25%--medical managment    COLONOSCOPY     HERNIA REPAIR  2006   x 3    salivary gland removed     left    skin cancer removed from left side of face     basal cell carcinoma   vein removed from left leg      Family History  Problem Relation Age of Onset   Hypertension Mother    Heart disease Mother    Varicose Veins Mother    Heart attack Father    Arthritis Father    COPD Father    Hearing loss Father    Stroke Father    Cancer Sister  Cancer Sister     Social History   Socioeconomic History   Marital status: Married    Spouse name: Verdis Frederickson   Number of children: 3   Years of education: 14   Highest education level: Associate degree: academic program  Occupational History   Occupation: Retired.  Tobacco Use   Smoking status: Former    Packs/day: 0.25    Years: 30.00    Pack years: 7.50    Types: Cigarettes, Pipe, Cigars    Quit date: 11/24/2011    Years since quitting: 8.8   Smokeless tobacco: Former    Quit date: 02/15/2011   Tobacco comments:    [place].  Vaping Use   Vaping Use: Never used  Substance and Sexual Activity   Alcohol use: Not Currently    Comment: one glass wine periodically   Drug use: No   Sexual activity: Not Currently  Other Topics Concern   Not on file  Social History Narrative   Lives with his wife. He enjoys playing games on the computer and playing cards with his wife for about two  hours a day. Due to a recent heart attack, he is not able to play golf anymore.   Social Determinants of Health   Financial Resource Strain: Low Risk    Difficulty of Paying Living Expenses: Not hard at all  Food Insecurity: No Food Insecurity   Worried About Charity fundraiser in the Last Year: Never true   [place] in the Last Year: Never true  Transportation Needs: No Transportation Needs   Lack of Transportation (Medical): No   Lack of Transportation (Non-Medical): No  Physical Activity: Inactive   Days of Exercise per Week: 0 days   Minutes of Exercise per Session: 0 min  Stress: No Stress Concern Present   Feeling of Stress : Not at all  Social Connections: Moderately Isolated   Frequency of Communication with Friends and Family: Twice a week   Frequency of Social Gatherings with Friends and Family: Never   Attends Religious Services: 1 to 4 times per year   Active Member of [employer] or Organizations: No   Attends Music therapist: Never   Marital Status: Married  Human resources officer Violence: Not At Risk   Fear of Current or Ex-Partner: No   Emotionally Abused: No   Physically Abused: No   Sexually Abused: No    Outpatient Medications Prior to Visit  Medication Sig Dispense Refill   cetirizine (EQ ALLERGY RELIEF, CETIRIZINE,) 10 MG tablet Take 1 tablet (10 mg total) by mouth daily. 90 tablet 3   clopidogrel (PLAVIX) 75 MG tablet Take 75 mg by mouth daily.     Cyanocobalamin 1000 MCG TBCR Take 1 tablet by mouth daily.      dexamethasone (DECADRON) 4 MG tablet TAKE 5 TABLETS (20 MG TOTAL) BY MOUTH SEE ADMIN INSTRUCTIONS (TAKE 5 TABLETS ONCE ON THE DAY OF TREATMENT) 25 tablet 0   esomeprazole (NEXIUM) 40 MG capsule Take 1 capsule (40 mg total) by mouth daily at 12 noon. 90 capsule 3   famciclovir (FAMVIR) 500 MG tablet Take 1 tablet (500 mg total) by mouth daily. 90 tablet 3   fluticasone (FLONASE) 50 MCG/ACT nasal spray Place 2 sprays into both nostrils  daily. 16 g 2   furosemide (LASIX) 20 MG tablet Take 20 mg by mouth daily as needed.     gabapentin (NEURONTIN) 600 MG tablet Take 1 tablet (600 mg total)  by mouth daily. 270 tablet 3   hydrocortisone 2.5 % ointment Apply topically 2 (two) times daily.     isosorbide mononitrate (IMDUR) 60 MG 24 hr tablet Take by mouth.     levothyroxine (SYNTHROID) 100 MCG tablet Take 1 tablet (100 mcg total) by mouth daily before breakfast. 90 tablet 3   LORazepam (ATIVAN) 1 MG tablet Take 1 mg by mouth at bedtime.     metoprolol succinate (TOPROL-XL) 25 MG 24 hr tablet Take 0.5 tablets (12.5 mg total) by mouth daily. 45 tablet 3   ondansetron (ZOFRAN) 8 MG tablet Take 1 tablet (8 mg total) by mouth 2 (two) times daily as needed (Nausea or vomiting). 30 tablet 1   oxyCODONE-acetaminophen (PERCOCET) 10-325 MG tablet Take 1 tablet by mouth every 8 (eight) hours as needed for pain. for pain 90 tablet 0   potassium chloride SA (KLOR-CON) 20 MEQ tablet TAKE 1 TABLET TWICE DAILY 180 tablet 3   prochlorperazine (COMPAZINE) 10 MG tablet Take 1 tablet (10 mg total) by mouth every 6 (six) hours as needed (Nausea or vomiting). 30 tablet 1   pyridoxine (B-6) 100 MG tablet Take 100 mg by mouth daily.      rivaroxaban (XARELTO) 10 MG TABS tablet Take 1 tablet (10 mg total) by mouth daily with supper. 90 tablet 3   sacubitril-valsartan (ENTRESTO) 49-51 MG Take by mouth 2 (two) times daily.     tamsulosin (FLOMAX) 0.4 MG CAPS capsule Take 1 capsule (0.4 mg total) by mouth daily after supper. 90 capsule 3   Zoledronic Acid 4 MG SOLR Inject into the vein.     benzonatate (TESSALON) 200 MG capsule Take 1 capsule (200 mg total) by mouth 3 (three) times daily as needed for cough. (Patient not taking: Reported on 10/01/2020) 45 capsule 0   nitroGLYCERIN (NITROSTAT) 0.4 MG SL tablet Place 1 tablet (0.4 mg total) under the tongue every 5 (five) minutes as needed. For chest pain (Patient not taking: Reported on 10/01/2020) 10 tablet 3    No facility-administered medications prior to visit.    Allergies  Allergen Reactions   Budesonide-Formoterol Fumarate Itching and Other (See Comments)   Codeine Itching   Hydrocodone-Acetaminophen Itching   Mushroom Extract Complex Nausea And Vomiting and Other (See Comments)    Only shitake mushroom  causes this reaction. Flu-like symptoms from portabello mushrooms    Review of Systems All review of systems negative except what is listed in the HPI     Objective:    Physical Exam Vitals reviewed.  Constitutional:      Appearance: Normal appearance.  Musculoskeletal:     Right lower leg: No edema.     Left lower leg: No edema.  Skin:    General: Skin is warm and dry.     Capillary Refill: Capillary refill takes less than 2 seconds.     Findings: Lesion present. No rash.     Comments: See picture of lesion; no significant erythema, no edema, drainage, fluctuance/induration  Neurological:     General: No focal deficit present.     Mental Status: He is alert and oriented to person, place, and time.  Psychiatric:        Mood and Affect: Mood normal.        Behavior: Behavior normal.        Thought Content: Thought content normal.        Judgment: Judgment normal.      BP (!) 147/72   Pulse (!) 54  Temp 97.8 F (36.6 C) (Oral)   Ht 6' (1.829 m)   Wt 212 lb (96.2 kg)   SpO2 95%   BMI 28.75 kg/m  Wt Readings from Last 3 Encounters:  10/01/20 212 lb (96.2 kg)  09/22/20 216 lb 8 oz (98.2 kg)  08/24/20 213 lb 12.8 oz (97 kg)    There are no preventive care reminders to display for this patient.  There are no preventive care reminders to display for this patient.   Lab Results  Component Value Date   TSH 1.236 07/18/2019   Lab Results  Component Value Date   WBC 4.7 09/29/2020   HGB 12.6 (L) 09/29/2020   HCT 38.5 (L) 09/29/2020   MCV 90.4 09/29/2020   PLT 147 (L) 09/29/2020   Lab Results  Component Value Date   NA 139 09/29/2020   K 4.0  09/29/2020   CHLORIDE 104 06/05/2015   CO2 27 09/29/2020   GLUCOSE 86 09/29/2020   BUN 16 09/29/2020   CREATININE 1.09 09/29/2020   BILITOT 0.6 09/29/2020   ALKPHOS 88 09/29/2020   AST 15 09/29/2020   ALT 23 09/29/2020   PROT 5.5 (L) 09/29/2020   ALBUMIN 3.9 09/29/2020   CALCIUM 9.3 09/29/2020   ANIONGAP 7 09/29/2020   EGFR 72 (L) 06/05/2015   Lab Results  Component Value Date   CHOL 149 08/09/2019   Lab Results  Component Value Date   HDL 30 (L) 08/09/2019   Lab Results  Component Value Date   LDLCALC 84 08/09/2019   Lab Results  Component Value Date   TRIG 205 (H) 08/09/2019   Lab Results  Component Value Date   CHOLHDL 5.0 08/09/2019   Lab Results  Component Value Date   HGBA1C 5.8 (H) 12/19/2016       Assessment & Plan:   1. Porokeratosis Area previously treated by dermatology with cryotherapy. No signs of infection today. Patient reports it is already looking much better. Recommend they continue with conservative measures: Vaseline, neosporin, tylenol, ibuprofen, etc. Patient aware of signs/symptoms requiring further/urgent evaluation. Scheduled to see PCP next week - can reassess at that time.   Follow-up as scheduled or sooner if symptoms worsen or fail to improve.   [facility] Olevia Bowens, DNP, FNP-C

## 2020-10-05 DIAGNOSIS — I252 Old myocardial infarction: Secondary | ICD-10-CM | POA: Diagnosis not present

## 2020-10-05 DIAGNOSIS — I2510 Atherosclerotic heart disease of native coronary artery without angina pectoris: Secondary | ICD-10-CM | POA: Diagnosis not present

## 2020-10-05 DIAGNOSIS — Z9861 Coronary angioplasty status: Secondary | ICD-10-CM | POA: Diagnosis not present

## 2020-10-05 DIAGNOSIS — I10 Essential (primary) hypertension: Secondary | ICD-10-CM | POA: Diagnosis not present

## 2020-10-05 DIAGNOSIS — Z48812 Encounter for surgical aftercare following surgery on the circulatory system: Secondary | ICD-10-CM | POA: Diagnosis not present

## 2020-10-06 ENCOUNTER — Other Ambulatory Visit: Payer: Medicare Other

## 2020-10-06 ENCOUNTER — Ambulatory Visit: Payer: Medicare Other

## 2020-10-06 DIAGNOSIS — Z9861 Coronary angioplasty status: Secondary | ICD-10-CM | POA: Diagnosis not present

## 2020-10-06 DIAGNOSIS — I10 Essential (primary) hypertension: Secondary | ICD-10-CM | POA: Diagnosis not present

## 2020-10-06 DIAGNOSIS — I2510 Atherosclerotic heart disease of native coronary artery without angina pectoris: Secondary | ICD-10-CM | POA: Diagnosis not present

## 2020-10-06 DIAGNOSIS — Z48812 Encounter for surgical aftercare following surgery on the circulatory system: Secondary | ICD-10-CM | POA: Diagnosis not present

## 2020-10-06 DIAGNOSIS — I252 Old myocardial infarction: Secondary | ICD-10-CM | POA: Diagnosis not present

## 2020-10-07 ENCOUNTER — Inpatient Hospital Stay: Payer: Medicare Other

## 2020-10-07 ENCOUNTER — Other Ambulatory Visit: Payer: Self-pay

## 2020-10-07 VITALS — BP 122/62 | HR 58 | Temp 97.7°F

## 2020-10-07 DIAGNOSIS — Z5112 Encounter for antineoplastic immunotherapy: Secondary | ICD-10-CM | POA: Diagnosis not present

## 2020-10-07 DIAGNOSIS — C9002 Multiple myeloma in relapse: Secondary | ICD-10-CM | POA: Diagnosis not present

## 2020-10-07 DIAGNOSIS — C9000 Multiple myeloma not having achieved remission: Secondary | ICD-10-CM

## 2020-10-07 LAB — COMPREHENSIVE METABOLIC PANEL
ALT: 15 U/L (ref 0–44)
AST: 13 U/L — ABNORMAL LOW (ref 15–41)
Albumin: 3.8 g/dL (ref 3.5–5.0)
Alkaline Phosphatase: 86 U/L (ref 38–126)
Anion gap: 9 (ref 5–15)
BUN: 17 mg/dL (ref 8–23)
CO2: 25 mmol/L (ref 22–32)
Calcium: 8.6 mg/dL — ABNORMAL LOW (ref 8.9–10.3)
Chloride: 105 mmol/L (ref 98–111)
Creatinine, Ser: 1.22 mg/dL (ref 0.61–1.24)
GFR, Estimated: >60 mL/min (ref >60)
Glucose, Bld: 97 mg/dL (ref 70–99)
Potassium: 3.9 mmol/L (ref 3.5–5.1)
Sodium: 139 mmol/L (ref 135–145)
Total Bilirubin: 0.6 mg/dL (ref 0.3–1.2)
Total Protein: 6 g/dL — ABNORMAL LOW (ref 6.5–8.1)

## 2020-10-07 LAB — CBC WITH DIFFERENTIAL/PLATELET
Abs Immature Granulocytes: 0.01 10*3/uL (ref 0.00–0.07)
Basophils Absolute: 0 10*3/uL (ref 0.0–0.1)
Basophils Relative: 1 %
Eosinophils Absolute: 0 10*3/uL (ref 0.0–0.5)
Eosinophils Relative: 0 %
HCT: 37.8 % — ABNORMAL LOW (ref 39.0–52.0)
Hemoglobin: 12.4 g/dL — ABNORMAL LOW (ref 13.0–17.0)
Immature Granulocytes: 0 %
Lymphocytes Relative: 35 %
Lymphs Abs: 1.7 10*3/uL (ref 0.7–4.0)
MCH: 29.5 pg (ref 26.0–34.0)
MCHC: 32.8 g/dL (ref 30.0–36.0)
MCV: 90 fL (ref 80.0–100.0)
Monocytes Absolute: 0.6 10*3/uL (ref 0.1–1.0)
Monocytes Relative: 12 %
Neutro Abs: 2.5 10*3/uL (ref 1.7–7.7)
Neutrophils Relative %: 52 %
Platelets: 147 10*3/uL — ABNORMAL LOW (ref 150–400)
RBC: 4.2 MIL/uL — ABNORMAL LOW (ref 4.22–5.81)
RDW: 15.7 % — ABNORMAL HIGH (ref 11.5–15.5)
WBC: 4.9 10*3/uL (ref 4.0–10.5)
nRBC: 0 % (ref 0.0–0.2)

## 2020-10-07 MED ORDER — SODIUM CHLORIDE 0.9 % IV SOLN
20.0000 mg | Freq: Once | INTRAVENOUS | Status: AC
  Administered 2020-10-07: 20 mg via INTRAVENOUS
  Filled 2020-10-07: qty 20

## 2020-10-07 MED ORDER — DEXTROSE 5 % IV SOLN
35.0000 mg/m2 | Freq: Once | INTRAVENOUS | Status: AC
  Administered 2020-10-07: 80 mg via INTRAVENOUS
  Filled 2020-10-07: qty 30

## 2020-10-07 MED ORDER — SODIUM CHLORIDE 0.9 % IV SOLN: Freq: Once | INTRAVENOUS | Status: AC

## 2020-10-07 MED ORDER — DARATUMUMAB-HYALURONIDASE-FIHJ 1800-30000 MG-UT/15ML ~~LOC~~ SOLN
1800.0000 mg | Freq: Once | SUBCUTANEOUS | Status: AC
  Administered 2020-10-07: 1800 mg via SUBCUTANEOUS
  Filled 2020-10-07: qty 15

## 2020-10-07 NOTE — Patient Instructions (Signed)
[facility]  Discharge Instructions: Thank you for choosing [facility] to provide your oncology and hematology care.   If you have a lab appointment with the [facility], please go directly to the [facility] and check in at the registration area.  Wear comfortable clothing and clothing appropriate for easy access to any Portacath or PICC line.   We strive to give you quality time with your provider. You may need to reschedule your appointment if you arrive late (15 or more minutes).  Arriving late affects you and other patients whose appointments are after yours.  Also, if you miss three or more appointments without notifying the office, you may be dismissed from the clinic at the provider's discretion.      For prescription refill requests, have your pharmacy contact our office and allow 72 hours for refills to be completed.    Today you received the following chemotherapy and/or immunotherapy agents fastpro, kyprolis decadron     To help prevent nausea and vomiting after your treatment, we encourage you to take your nausea medication as directed.  BELOW ARE SYMPTOMS THAT SHOULD BE REPORTED IMMEDIATELY: *FEVER GREATER THAN 100.4 F (38 C) OR HIGHER *CHILLS OR SWEATING *NAUSEA AND VOMITING THAT IS NOT CONTROLLED WITH YOUR NAUSEA MEDICATION *UNUSUAL SHORTNESS OF BREATH *UNUSUAL BRUISING OR BLEEDING *URINARY PROBLEMS (pain or burning when urinating, or frequent urination) *BOWEL PROBLEMS (unusual diarrhea, constipation, pain near the anus) TENDERNESS IN MOUTH AND THROAT WITH OR WITHOUT PRESENCE OF ULCERS (sore throat, sores in mouth, or a toothache) UNUSUAL RASH, SWELLING OR PAIN  UNUSUAL VAGINAL DISCHARGE OR ITCHING   Items with * indicate a potential emergency and should be followed up as soon as possible or go to the Emergency Department if any problems should occur.  Please show the CHEMOTHERAPY ALERT CARD or IMMUNOTHERAPY ALERT CARD at  check-in to the Emergency Department and triage nurse. Should you have questions after your visit or need to cancel or reschedule your appointment, please contact [facility]  [phone] and follow the prompts.  Office hours are 8:00 a.m. to 4:30 p.m. Monday - Friday. Please note that voicemails left after 4:00 p.m. may not be returned until the following business day.  We are closed weekends and major holidays. You have access to a nurse at all times for urgent questions. Please call the main number to the clinic [phone] and follow the prompts.  For any non-urgent questions, you may also contact your provider using MyChart. We now offer e-Visits for anyone 55 and older to request care online for non-urgent symptoms. For details visit [email or web address].   Also download the MyChart app! Go to the app store, search "MyChart", open the app, select [facility], and log in with your MyChart username and password.  Due to Covid, a mask is required upon entering the hospital/clinic. If you do not have a mask, one will be given to you upon arrival. For doctor visits, patients may have 1 support person aged 43 or older with them. For treatment visits, patients cannot have anyone with them due to current Covid guidelines and our immunocompromised population.

## 2020-10-08 DIAGNOSIS — I2510 Atherosclerotic heart disease of native coronary artery without angina pectoris: Secondary | ICD-10-CM | POA: Diagnosis not present

## 2020-10-08 DIAGNOSIS — Z9861 Coronary angioplasty status: Secondary | ICD-10-CM | POA: Diagnosis not present

## 2020-10-08 DIAGNOSIS — I10 Essential (primary) hypertension: Secondary | ICD-10-CM | POA: Diagnosis not present

## 2020-10-08 DIAGNOSIS — I252 Old myocardial infarction: Secondary | ICD-10-CM | POA: Diagnosis not present

## 2020-10-08 DIAGNOSIS — Z48812 Encounter for surgical aftercare following surgery on the circulatory system: Secondary | ICD-10-CM | POA: Diagnosis not present

## 2020-10-09 ENCOUNTER — Encounter: Payer: Self-pay | Admitting: Sports Medicine

## 2020-10-09 ENCOUNTER — Other Ambulatory Visit: Payer: Self-pay

## 2020-10-09 ENCOUNTER — Ambulatory Visit (INDEPENDENT_AMBULATORY_CARE_PROVIDER_SITE_OTHER): Payer: Medicare Other | Admitting: Sports Medicine

## 2020-10-09 ENCOUNTER — Ambulatory Visit (INDEPENDENT_AMBULATORY_CARE_PROVIDER_SITE_OTHER): Payer: Medicare Other

## 2020-10-09 DIAGNOSIS — E7800 Pure hypercholesterolemia, unspecified: Secondary | ICD-10-CM

## 2020-10-09 DIAGNOSIS — R0600 Dyspnea, unspecified: Secondary | ICD-10-CM | POA: Diagnosis not present

## 2020-10-09 DIAGNOSIS — Z0000 Encounter for general adult medical examination without abnormal findings: Secondary | ICD-10-CM | POA: Insufficient documentation

## 2020-10-09 DIAGNOSIS — I2510 Atherosclerotic heart disease of native coronary artery without angina pectoris: Secondary | ICD-10-CM | POA: Diagnosis not present

## 2020-10-09 DIAGNOSIS — M47815 Spondylosis without myelopathy or radiculopathy, thoracolumbar region: Secondary | ICD-10-CM

## 2020-10-09 DIAGNOSIS — Z9861 Coronary angioplasty status: Secondary | ICD-10-CM | POA: Diagnosis not present

## 2020-10-09 DIAGNOSIS — C44629 Squamous cell carcinoma of skin of left upper limb, including shoulder: Secondary | ICD-10-CM | POA: Insufficient documentation

## 2020-10-09 DIAGNOSIS — R0602 Shortness of breath: Secondary | ICD-10-CM | POA: Diagnosis not present

## 2020-10-09 DIAGNOSIS — E119 Type 2 diabetes mellitus without complications: Secondary | ICD-10-CM

## 2020-10-09 DIAGNOSIS — L989 Disorder of the skin and subcutaneous tissue, unspecified: Secondary | ICD-10-CM

## 2020-10-09 MED ORDER — TETANUS-DIPHTH-ACELL PERTUSSIS 5-2.5-18.5 LF-MCG/0.5 IM SUSY: 0.5000 mL | PREFILLED_SYRINGE | Freq: Once | INTRAMUSCULAR | 0 refills | Status: AC

## 2020-10-09 MED ORDER — SHINGRIX 50 MCG/0.5ML IM SUSR: 0.5000 mL | Freq: Once | INTRAMUSCULAR | 0 refills | Status: AC

## 2020-10-09 MED ORDER — OXYCODONE-ACETAMINOPHEN 10-325 MG PO TABS: 1.0000 | ORAL_TABLET | Freq: Three times a day (TID) | ORAL | 0 refills | Status: DC | PRN

## 2020-10-09 NOTE — Assessment & Plan Note (Addendum)
Due for Tdap and Shingrix, we will ensure its okay to give these vaccines with his oncologist and then send to his pharmacy.  Update: No contraindication to Tdap or Shingrix, sending to pharmacy.

## 2020-10-09 NOTE — Assessment & Plan Note (Signed)
It sounds like [name] [name] had some cryotherapy with dermatology sometime ago, he is currently on a chemotherapy regimen, he will likely have delayed healing, there is still a nonhealing ulcer on the right lateral malleolus, no underlying bone pain, he will just keep this covered with some antibiotic ointment in the meantime.

## 2020-10-09 NOTE — Addendum Note (Signed)
Addended by: Silverio Decamp on: 10/09/2020 01:29 PM   Modules accepted: Orders

## 2020-10-09 NOTE — Progress Notes (Addendum)
    Procedures performed today:    None.  Independent interpretation of notes and tests performed by another provider:   None.  Brief History, Exam, Impression, and Recommendations:    Dyspnea [name] [name] is on a new regimen for his multiple myeloma, he is having some increasing dyspnea, lungs are clear today, he is able to speak full sentence, we will get a chest x-ray.  Skin lesion It sounds like [name] [name] had some cryotherapy with dermatology sometime ago, he is currently on a chemotherapy regimen, he will likely have delayed healing, there is still a nonhealing ulcer on the right lateral malleolus, no underlying bone pain, he will just keep this covered with some antibiotic ointment in the meantime.  Annual physical exam Due for Tdap and Shingrix, we will ensure its okay to give these vaccines with his oncologist and then send to his pharmacy.  Update: No contraindication to Tdap or Shingrix, sending to pharmacy.  Hypercalcemia Likely related to his myeloma. We can recheck this in a month.  Hyperlipidemia [name] [name] had some widespread aches and pains, we had discontinued his high-dose rosuvastatin to see if the statin was at play, he did not notice any improvement in his pain so we will restart his statin, cholesterol is out of control currently.    ___________________________________________ Gwen Her. Dianah Field, M.D., ABFM., CAQSM. Primary Care and Platinum Instructor of [place] [employer] [facility] [employer]

## 2020-10-09 NOTE — Assessment & Plan Note (Signed)
[name] [name] is on a new regimen for his multiple myeloma, he is having some increasing dyspnea, lungs are clear today, he is able to speak full sentence, we will get a chest x-ray.

## 2020-10-12 DIAGNOSIS — Z9861 Coronary angioplasty status: Secondary | ICD-10-CM | POA: Diagnosis not present

## 2020-10-12 DIAGNOSIS — I252 Old myocardial infarction: Secondary | ICD-10-CM | POA: Diagnosis not present

## 2020-10-12 DIAGNOSIS — Z48812 Encounter for surgical aftercare following surgery on the circulatory system: Secondary | ICD-10-CM | POA: Diagnosis not present

## 2020-10-12 DIAGNOSIS — I2510 Atherosclerotic heart disease of native coronary artery without angina pectoris: Secondary | ICD-10-CM | POA: Diagnosis not present

## 2020-10-12 DIAGNOSIS — I10 Essential (primary) hypertension: Secondary | ICD-10-CM | POA: Diagnosis not present

## 2020-10-13 ENCOUNTER — Other Ambulatory Visit: Payer: Self-pay

## 2020-10-13 ENCOUNTER — Inpatient Hospital Stay: Payer: Medicare Other

## 2020-10-13 VITALS — BP 121/67 | HR 62 | Temp 97.6°F | Resp 16

## 2020-10-13 DIAGNOSIS — C9002 Multiple myeloma in relapse: Secondary | ICD-10-CM | POA: Diagnosis not present

## 2020-10-13 DIAGNOSIS — I252 Old myocardial infarction: Secondary | ICD-10-CM | POA: Diagnosis not present

## 2020-10-13 DIAGNOSIS — C9000 Multiple myeloma not having achieved remission: Secondary | ICD-10-CM

## 2020-10-13 DIAGNOSIS — I2510 Atherosclerotic heart disease of native coronary artery without angina pectoris: Secondary | ICD-10-CM | POA: Diagnosis not present

## 2020-10-13 DIAGNOSIS — Z48812 Encounter for surgical aftercare following surgery on the circulatory system: Secondary | ICD-10-CM | POA: Diagnosis not present

## 2020-10-13 DIAGNOSIS — Z9861 Coronary angioplasty status: Secondary | ICD-10-CM | POA: Diagnosis not present

## 2020-10-13 DIAGNOSIS — Z5112 Encounter for antineoplastic immunotherapy: Secondary | ICD-10-CM | POA: Diagnosis not present

## 2020-10-13 DIAGNOSIS — I10 Essential (primary) hypertension: Secondary | ICD-10-CM | POA: Diagnosis not present

## 2020-10-13 LAB — COMPREHENSIVE METABOLIC PANEL
ALT: 30 U/L (ref 0–44)
AST: 21 U/L (ref 15–41)
Albumin: 3.8 g/dL (ref 3.5–5.0)
Alkaline Phosphatase: 88 U/L (ref 38–126)
Anion gap: 9 (ref 5–15)
BUN: 20 mg/dL (ref 8–23)
CO2: 29 mmol/L (ref 22–32)
Calcium: 9.8 mg/dL (ref 8.9–10.3)
Chloride: 102 mmol/L (ref 98–111)
Creatinine, Ser: 1.21 mg/dL (ref 0.61–1.24)
GFR, Estimated: >60 mL/min (ref >60)
Glucose, Bld: 98 mg/dL (ref 70–99)
Potassium: 3.8 mmol/L (ref 3.5–5.1)
Sodium: 140 mmol/L (ref 135–145)
Total Bilirubin: 0.8 mg/dL (ref 0.3–1.2)
Total Protein: 6.1 g/dL — ABNORMAL LOW (ref 6.5–8.1)

## 2020-10-13 LAB — CBC WITH DIFFERENTIAL/PLATELET
Abs Immature Granulocytes: 0.02 10*3/uL (ref 0.00–0.07)
Basophils Absolute: 0 10*3/uL (ref 0.0–0.1)
Basophils Relative: 0 %
Eosinophils Absolute: 0 10*3/uL (ref 0.0–0.5)
Eosinophils Relative: 0 %
HCT: 37.5 % — ABNORMAL LOW (ref 39.0–52.0)
Hemoglobin: 12.6 g/dL — ABNORMAL LOW (ref 13.0–17.0)
Immature Granulocytes: 0 %
Lymphocytes Relative: 18 %
Lymphs Abs: 1.2 10*3/uL (ref 0.7–4.0)
MCH: 30.2 pg (ref 26.0–34.0)
MCHC: 33.6 g/dL (ref 30.0–36.0)
MCV: 89.9 fL (ref 80.0–100.0)
Monocytes Absolute: 0.6 10*3/uL (ref 0.1–1.0)
Monocytes Relative: 9 %
Neutro Abs: 4.7 10*3/uL (ref 1.7–7.7)
Neutrophils Relative %: 73 %
Platelets: 146 10*3/uL — ABNORMAL LOW (ref 150–400)
RBC: 4.17 MIL/uL — ABNORMAL LOW (ref 4.22–5.81)
RDW: 15.6 % — ABNORMAL HIGH (ref 11.5–15.5)
WBC: 6.5 10*3/uL (ref 4.0–10.5)
nRBC: 0 % (ref 0.0–0.2)

## 2020-10-13 MED ORDER — SODIUM CHLORIDE 0.9 % IV SOLN: Freq: Once | INTRAVENOUS | Status: AC

## 2020-10-13 MED ORDER — SODIUM CHLORIDE 0.9 % IV SOLN
20.0000 mg | Freq: Once | INTRAVENOUS | Status: AC
  Administered 2020-10-13: 20 mg via INTRAVENOUS
  Filled 2020-10-13: qty 20

## 2020-10-13 MED ORDER — DEXTROSE 5 % IV SOLN
42.0000 mg/m2 | Freq: Once | INTRAVENOUS | Status: AC
  Administered 2020-10-13: 90 mg via INTRAVENOUS
  Filled 2020-10-13: qty 30

## 2020-10-13 NOTE — Patient Instructions (Signed)
[facility]  Discharge Instructions: Thank you for choosing [facility] to provide your oncology and hematology care.   If you have a lab appointment with the [facility], please go directly to the [facility] and check in at the registration area.  Wear comfortable clothing and clothing appropriate for easy access to any Portacath or PICC line.   We strive to give you quality time with your provider. You may need to reschedule your appointment if you arrive late (15 or more minutes).  Arriving late affects you and other patients whose appointments are after yours.  Also, if you miss three or more appointments without notifying the office, you may be dismissed from the clinic at the provider's discretion.      For prescription refill requests, have your pharmacy contact our office and allow 72 hours for refills to be completed.    Today you received the following chemotherapy and/or immunotherapy agents kyprolis     To help prevent nausea and vomiting after your treatment, we encourage you to take your nausea medication as directed.  BELOW ARE SYMPTOMS THAT SHOULD BE REPORTED IMMEDIATELY: *FEVER GREATER THAN 100.4 F (38 C) OR HIGHER *CHILLS OR SWEATING *NAUSEA AND VOMITING THAT IS NOT CONTROLLED WITH YOUR NAUSEA MEDICATION *UNUSUAL SHORTNESS OF BREATH *UNUSUAL BRUISING OR BLEEDING *URINARY PROBLEMS (pain or burning when urinating, or frequent urination) *BOWEL PROBLEMS (unusual diarrhea, constipation, pain near the anus) TENDERNESS IN MOUTH AND THROAT WITH OR WITHOUT PRESENCE OF ULCERS (sore throat, sores in mouth, or a toothache) UNUSUAL RASH, SWELLING OR PAIN  UNUSUAL VAGINAL DISCHARGE OR ITCHING   Items with * indicate a potential emergency and should be followed up as soon as possible or go to the Emergency Department if any problems should occur.  Please show the CHEMOTHERAPY ALERT CARD or IMMUNOTHERAPY ALERT CARD at check-in to the  Emergency Department and triage nurse. Should you have questions after your visit or need to cancel or reschedule your appointment, please contact [facility]  [phone] and follow the prompts.  Office hours are 8:00 a.m. to 4:30 p.m. Monday - Friday. Please note that voicemails left after 4:00 p.m. may not be returned until the following business day.  We are closed weekends and major holidays. You have access to a nurse at all times for urgent questions. Please call the main number to the clinic [phone] and follow the prompts.  For any non-urgent questions, you may also contact your provider using MyChart. We now offer e-Visits for anyone 79 and older to request care online for non-urgent symptoms. For details visit [email or web address].   Also download the MyChart app! Go to the app store, search "MyChart", open the app, select [facility], and log in with your MyChart username and password.  Due to Covid, a mask is required upon entering the hospital/clinic. If you do not have a mask, one will be given to you upon arrival. For doctor visits, patients may have 1 support person aged 59 or older with them. For treatment visits, patients cannot have anyone with them due to current Covid guidelines and our immunocompromised population.

## 2020-10-14 ENCOUNTER — Encounter: Payer: Self-pay | Admitting: Hematology & Oncology

## 2020-10-14 DIAGNOSIS — E119 Type 2 diabetes mellitus without complications: Secondary | ICD-10-CM | POA: Diagnosis not present

## 2020-10-15 ENCOUNTER — Encounter: Payer: Self-pay | Admitting: Hematology & Oncology

## 2020-10-15 DIAGNOSIS — I2510 Atherosclerotic heart disease of native coronary artery without angina pectoris: Secondary | ICD-10-CM | POA: Diagnosis not present

## 2020-10-15 DIAGNOSIS — I252 Old myocardial infarction: Secondary | ICD-10-CM | POA: Diagnosis not present

## 2020-10-15 DIAGNOSIS — Z48812 Encounter for surgical aftercare following surgery on the circulatory system: Secondary | ICD-10-CM | POA: Diagnosis not present

## 2020-10-15 LAB — COMPREHENSIVE METABOLIC PANEL
AG Ratio: 2.1 (calc) (ref 1.0–2.5)
ALT: 50 U/L — ABNORMAL HIGH (ref 9–46)
AST: 33 U/L (ref 10–35)
Albumin: 4.1 g/dL (ref 3.6–5.1)
Alkaline phosphatase (APISO): 106 U/L (ref 35–144)
BUN: 20 mg/dL (ref 7–25)
CO2: 27 mmol/L (ref 20–32)
Calcium: 11.3 mg/dL — ABNORMAL HIGH (ref 8.6–10.3)
Chloride: 103 mmol/L (ref 98–110)
Creat: 1.15 mg/dL (ref 0.70–1.28)
Globulin: 2 g/dL (calc) (ref 1.9–3.7)
Glucose, Bld: 141 mg/dL — ABNORMAL HIGH (ref 65–99)
Potassium: 4.2 mmol/L (ref 3.5–5.3)
Sodium: 140 mmol/L (ref 135–146)
Total Bilirubin: 1.1 mg/dL (ref 0.2–1.2)
Total Protein: 6.1 g/dL (ref 6.1–8.1)

## 2020-10-15 LAB — CBC
HCT: 38.5 % (ref 38.5–50.0)
Hemoglobin: 12.8 g/dL — ABNORMAL LOW (ref 13.2–17.1)
MCH: 29.6 pg (ref 27.0–33.0)
MCHC: 33.2 g/dL (ref 32.0–36.0)
MCV: 89.1 fL (ref 80.0–100.0)
MPV: 10.4 fL (ref 7.5–12.5)
Platelets: 148 10*3/uL (ref 140–400)
RBC: 4.32 10*6/uL (ref 4.20–5.80)
RDW: 15.2 % — ABNORMAL HIGH (ref 11.0–15.0)
WBC: 12 10*3/uL — ABNORMAL HIGH (ref 3.8–10.8)

## 2020-10-15 LAB — LIPID PANEL
Cholesterol: 216 mg/dL — ABNORMAL HIGH (ref <200)
HDL: 49 mg/dL (ref >=40)
LDL Cholesterol (Calc): 136 mg/dL (calc) — ABNORMAL HIGH
Non-HDL Cholesterol (Calc): 167 mg/dL (calc) — ABNORMAL HIGH (ref <130)
Total CHOL/HDL Ratio: 4.4 (calc) (ref <5.0)
Triglycerides: 175 mg/dL — ABNORMAL HIGH (ref <150)

## 2020-10-15 LAB — HEMOGLOBIN A1C
Hgb A1c MFr Bld: 6.3 % of total Hgb — ABNORMAL HIGH (ref <5.7)
Mean Plasma Glucose: 134 mg/dL
eAG (mmol/L): 7.4 mmol/L

## 2020-10-15 LAB — TSH: TSH: 1.04 mIU/L (ref 0.40–4.50)

## 2020-10-15 NOTE — Addendum Note (Signed)
Addended by: Silverio Decamp on: 10/15/2020 10:25 AM   Modules accepted: Orders

## 2020-10-15 NOTE — Assessment & Plan Note (Signed)
Likely related to his myeloma. We can recheck this in a month.

## 2020-10-16 MED ORDER — ROSUVASTATIN CALCIUM 40 MG PO TABS: 40.0000 mg | ORAL_TABLET | Freq: Every day | ORAL | 3 refills | Status: DC

## 2020-10-16 NOTE — Assessment & Plan Note (Signed)
[name] [name] had some widespread aches and pains, we had discontinued his high-dose rosuvastatin to see if the statin was at play, he did not notice any improvement in his pain so we will restart his statin, cholesterol is out of control currently.

## 2020-10-16 NOTE — Addendum Note (Signed)
Addended by: Silverio Decamp on: 10/16/2020 09:04 AM   Modules accepted: Orders

## 2020-10-20 ENCOUNTER — Encounter: Payer: Self-pay | Admitting: Hematology & Oncology

## 2020-10-20 ENCOUNTER — Inpatient Hospital Stay: Payer: Medicare Other | Attending: Hematology & Oncology

## 2020-10-20 ENCOUNTER — Other Ambulatory Visit: Payer: Self-pay

## 2020-10-20 ENCOUNTER — Inpatient Hospital Stay: Payer: Medicare Other

## 2020-10-20 ENCOUNTER — Inpatient Hospital Stay (HOSPITAL_BASED_OUTPATIENT_CLINIC_OR_DEPARTMENT_OTHER): Payer: Medicare Other | Admitting: Hematology & Oncology

## 2020-10-20 VITALS — BP 126/71 | HR 70 | Temp 97.9°F | Resp 18 | Wt 213.5 lb

## 2020-10-20 DIAGNOSIS — C9000 Multiple myeloma not having achieved remission: Secondary | ICD-10-CM

## 2020-10-20 DIAGNOSIS — Z86718 Personal history of other venous thrombosis and embolism: Secondary | ICD-10-CM | POA: Diagnosis not present

## 2020-10-20 DIAGNOSIS — Z7901 Long term (current) use of anticoagulants: Secondary | ICD-10-CM | POA: Diagnosis not present

## 2020-10-20 DIAGNOSIS — C9002 Multiple myeloma in relapse: Secondary | ICD-10-CM | POA: Insufficient documentation

## 2020-10-20 DIAGNOSIS — Z5112 Encounter for antineoplastic immunotherapy: Secondary | ICD-10-CM | POA: Diagnosis not present

## 2020-10-20 DIAGNOSIS — I252 Old myocardial infarction: Secondary | ICD-10-CM | POA: Diagnosis not present

## 2020-10-20 DIAGNOSIS — Z48812 Encounter for surgical aftercare following surgery on the circulatory system: Secondary | ICD-10-CM | POA: Diagnosis not present

## 2020-10-20 DIAGNOSIS — I2510 Atherosclerotic heart disease of native coronary artery without angina pectoris: Secondary | ICD-10-CM | POA: Diagnosis not present

## 2020-10-20 LAB — CBC WITH DIFFERENTIAL (CANCER CENTER ONLY)
Abs Immature Granulocytes: 0.01 10*3/uL (ref 0.00–0.07)
Basophils Absolute: 0 10*3/uL (ref 0.0–0.1)
Basophils Relative: 0 %
Eosinophils Absolute: 0 10*3/uL (ref 0.0–0.5)
Eosinophils Relative: 0 %
HCT: 37.1 % — ABNORMAL LOW (ref 39.0–52.0)
Hemoglobin: 12.5 g/dL — ABNORMAL LOW (ref 13.0–17.0)
Immature Granulocytes: 0 %
Lymphocytes Relative: 24 %
Lymphs Abs: 1.1 10*3/uL (ref 0.7–4.0)
MCH: 30.3 pg (ref 26.0–34.0)
MCHC: 33.7 g/dL (ref 30.0–36.0)
MCV: 90 fL (ref 80.0–100.0)
Monocytes Absolute: 0.4 10*3/uL (ref 0.1–1.0)
Monocytes Relative: 10 %
Neutro Abs: 2.9 10*3/uL (ref 1.7–7.7)
Neutrophils Relative %: 66 %
Platelet Count: 159 10*3/uL (ref 150–400)
RBC: 4.12 MIL/uL — ABNORMAL LOW (ref 4.22–5.81)
RDW: 15.4 % (ref 11.5–15.5)
WBC Count: 4.4 10*3/uL (ref 4.0–10.5)
nRBC: 0 % (ref 0.0–0.2)

## 2020-10-20 LAB — LACTATE DEHYDROGENASE: LDH: 156 U/L (ref 98–192)

## 2020-10-20 LAB — CMP (CANCER CENTER ONLY)
ALT: 21 U/L (ref 0–44)
AST: 16 U/L (ref 15–41)
Albumin: 4 g/dL (ref 3.5–5.0)
Alkaline Phosphatase: 105 U/L (ref 38–126)
Anion gap: 10 (ref 5–15)
BUN: 12 mg/dL (ref 8–23)
CO2: 25 mmol/L (ref 22–32)
Calcium: 9 mg/dL (ref 8.9–10.3)
Chloride: 104 mmol/L (ref 98–111)
Creatinine: 1.12 mg/dL (ref 0.61–1.24)
GFR, Estimated: >60 mL/min (ref >60)
Glucose, Bld: 121 mg/dL — ABNORMAL HIGH (ref 70–99)
Potassium: 3.9 mmol/L (ref 3.5–5.1)
Sodium: 139 mmol/L (ref 135–145)
Total Bilirubin: 0.7 mg/dL (ref 0.3–1.2)
Total Protein: 6 g/dL — ABNORMAL LOW (ref 6.5–8.1)

## 2020-10-20 NOTE — Progress Notes (Signed)
Hematology and Oncology Follow Up Visit  [name] [name] [id number] 13-Oct-1940 80 y.o. 10/20/2020   Principle Diagnosis:  IgG kappa myeloma - Relapsed -- 1q duplication Acute thromboembolism of the right lower leg   Past Therapy:        S/P ASCT at [facility] on 04/02/2015 Patient s/p cycle 5 of Velcade/Revlimid/Decadron Revlimid '10mg'$  po q day (21/28) -- d/c on 09/15/2017   Current Therapy:     Faspro/Velcade/Decadron -- s/p cycle #6- start on 03/20/2020   -- d/c velcade and start Kyprolis on 09/27/2020 Zometa 4 mg IV every 3 months  - next dose due 10/2020 Xarelto 10 mg by mouth daily   Interim History:  [name] [name] is here today for follow-up.  Unfortunately, is here a week early.  I am not sure how the schedule got messed up.  He did tell us when he had the Kyprolis and the Darzalex, this really was hard on him.  I am a little bit surprised by this.  I really really thought that he would have had no problems.  However, I am not sure if this was related to the Kyprolis or to the Darzalex.  I do not think that this is anything with congestive heart failure.    We will have to see what the myeloma studies look like.  He has had only 1 cycle to date of the Kyprolis/Faspro combination.  Again I do not see any evidence of congestive heart failure issues.  He does seem to be eating okay.  He is having no problems with nausea or vomiting.  He is not diabetic.  He did take his Decadron today already.  His blood sugar was only 121.  I do not see any problems with bowels or bladder.  He has had no issues with constipation or diarrhea.  There is been no rashes.  Overall, I would say his performance status is ECOG 1.     Medications:  Allergies as of 10/20/2020       Reactions   Budesonide-formoterol Fumarate Itching, Other (See Comments)   Codeine Itching   Hydrocodone-acetaminophen Itching   Mushroom Extract Complex Nausea And Vomiting, Other (See Comments)   Only shitake mushroom   causes this reaction. Flu-like symptoms from portabello mushrooms        Medication List        Accurate as of October 20, 2020  2:40 PM. If you have any questions, ask your nurse or doctor.          benzonatate 200 MG capsule Commonly known as: TESSALON Take 1 capsule (200 mg total) by mouth 3 (three) times daily as needed for cough.   cetirizine 10 MG tablet Commonly known as: EQ Allergy Relief (Cetirizine) Take 1 tablet (10 mg total) by mouth daily.   clopidogrel 75 MG tablet Commonly known as: PLAVIX Take 75 mg by mouth daily.   Cyanocobalamin 1000 MCG Tbcr Take 1 tablet by mouth daily.   dexamethasone 4 MG tablet Commonly known as: DECADRON TAKE 5 TABLETS (20 MG TOTAL) BY MOUTH SEE ADMIN INSTRUCTIONS (TAKE 5 TABLETS ONCE ON THE DAY OF TREATMENT)   esomeprazole 40 MG capsule Commonly known as: NEXIUM Take 1 capsule (40 mg total) by mouth daily at 12 noon.   famciclovir 500 MG tablet Commonly known as: FAMVIR Take 1 tablet (500 mg total) by mouth daily.   fluticasone 50 MCG/ACT nasal spray Commonly known as: FLONASE Place 2 sprays into both nostrils daily.   furosemide 20 MG tablet  Commonly known as: LASIX Take 20 mg by mouth daily as needed.   gabapentin 600 MG tablet Commonly known as: NEURONTIN Take 1,800 mg by mouth daily.   hydrocortisone 2.5 % ointment Apply topically 2 (two) times daily.   isosorbide mononitrate 60 MG 24 hr tablet Commonly known as: IMDUR Take 60 mg by mouth in the morning and at bedtime.   levothyroxine 100 MCG tablet Commonly known as: Synthroid Take 1 tablet (100 mcg total) by mouth daily before breakfast.   LORazepam 1 MG tablet Commonly known as: ATIVAN Take 1 mg by mouth at bedtime.   metoprolol succinate 25 MG 24 hr tablet Commonly known as: TOPROL-XL Take 0.5 tablets (12.5 mg total) by mouth daily.   nitroGLYCERIN 0.4 MG SL tablet Commonly known as: NITROSTAT Place 1 tablet (0.4 mg total) under the tongue  every 5 (five) minutes as needed. For chest pain   ondansetron 8 MG tablet Commonly known as: Zofran Take 1 tablet (8 mg total) by mouth 2 (two) times daily as needed (Nausea or vomiting).   oxyCODONE-acetaminophen 10-325 MG tablet Commonly known as: PERCOCET Take 1 tablet by mouth every 8 (eight) hours as needed for pain. for pain   potassium chloride SA 20 MEQ tablet Commonly known as: KLOR-CON TAKE 1 TABLET TWICE DAILY   prochlorperazine 10 MG tablet Commonly known as: COMPAZINE Take 1 tablet (10 mg total) by mouth every 6 (six) hours as needed (Nausea or vomiting).   pyridoxine 100 MG tablet Commonly known as: B-6 Take 100 mg by mouth daily.   rivaroxaban 10 MG Tabs tablet Commonly known as: XARELTO Take 1 tablet (10 mg total) by mouth daily with supper.   rosuvastatin 40 MG tablet Commonly known as: CRESTOR Take 1 tablet (40 mg total) by mouth at bedtime.   sacubitril-valsartan 49-51 MG Commonly known as: ENTRESTO Take 2 tablets by mouth 2 (two) times daily.   tamsulosin 0.4 MG Caps capsule Commonly known as: FLOMAX Take 1 capsule (0.4 mg total) by mouth daily after supper.   Zoledronic Acid 4 MG Solr Inject into the vein.        Allergies:  Allergies  Allergen Reactions   Budesonide-Formoterol Fumarate Itching and Other (See Comments)   Codeine Itching   Hydrocodone-Acetaminophen Itching   Mushroom Extract Complex Nausea And Vomiting and Other (See Comments)    Only shitake mushroom  causes this reaction. Flu-like symptoms from portabello mushrooms    Past Medical History, Surgical history, Social history, and Family History were reviewed and updated.  Review of Systems: Review of Systems  HENT: Negative.    Eyes: Negative.   Respiratory: Negative.    Cardiovascular:  Positive for chest pain.  Gastrointestinal: Negative.   Genitourinary: Negative.   Musculoskeletal:  Positive for back pain.  Skin: Negative.   Neurological: Negative.    Endo/Heme/Allergies: Negative.   Psychiatric/Behavioral: Negative.      Physical Exam:  weight is 213 lb 8 oz (96.8 kg). His oral temperature is 97.9 F (36.6 C). His blood pressure is 126/71 and his pulse is 70. His respiration is 18 and oxygen saturation is 96%.   Wt Readings from Last 3 Encounters:  10/20/20 213 lb 8 oz (96.8 kg)  10/09/20 218 lb (98.9 kg)  10/01/20 212 lb (96.2 kg)    Physical Exam Vitals reviewed.  HENT:     Head: Normocephalic and atraumatic.  Eyes:     Pupils: Pupils are equal, round, and reactive to light.  Cardiovascular:  Rate and Rhythm: Normal rate and regular rhythm.     Heart sounds: Normal heart sounds.  Pulmonary:     Effort: Pulmonary effort is normal.     Breath sounds: Normal breath sounds.  Abdominal:     General: Bowel sounds are normal.     Palpations: Abdomen is soft.  Musculoskeletal:        General: No tenderness or deformity. Normal range of motion.     Cervical back: Normal range of motion.  Lymphadenopathy:     Cervical: No cervical adenopathy.  Skin:    General: Skin is warm and dry.     Findings: No erythema or rash.  Neurological:     Mental Status: He is alert and oriented to person, place, and time.  Psychiatric:        Behavior: Behavior normal.        Thought Content: Thought content normal.        Judgment: Judgment normal.     Lab Results  Component Value Date   WBC 4.4 10/20/2020   HGB 12.5 (L) 10/20/2020   HCT 37.1 (L) 10/20/2020   MCV 90.0 10/20/2020   PLT 159 10/20/2020   Lab Results  Component Value Date   FERRITIN 68 09/22/2020   IRON 81 09/22/2020   TIBC 293 09/22/2020   UIBC 211 09/22/2020   IRONPCTSAT 28 09/22/2020   Lab Results  Component Value Date   RETICCTPCT 1.7 07/18/2019   RBC 4.12 (L) 10/20/2020   Lab Results  Component Value Date   KPAFRELGTCHN 190.8 (H) 09/22/2020   LAMBDASER 3.0 (L) 09/22/2020   KAPLAMBRATIO 63.60 (H) 09/22/2020   Lab Results  Component Value Date    IGGSERUM 497 (L) 09/22/2020   IGA 13 (L) 09/22/2020   IGMSERUM 18 09/22/2020   Lab Results  Component Value Date   TOTALPROTELP 5.5 (L) 09/22/2020   ALBUMINELP 3.3 09/22/2020   A1GS 0.2 09/22/2020   A2GS 0.8 09/22/2020   BETS 0.9 09/22/2020   BETA2SER 0.3 02/11/2015   GAMS 0.4 09/22/2020   MSPIKE 0.1 (H) 09/22/2020   SPEI Comment 09/22/2020     Chemistry      Component Value Date/Time   NA 139 10/20/2020 1351   NA 145 (H) 06/28/2019 1501   NA 140 01/27/2017 1044   NA 141 06/05/2015 1036   K 3.9 10/20/2020 1351   K 3.3 01/27/2017 1044   K 2.9 (LL) 06/05/2015 1036   CL 104 10/20/2020 1351   CL 101 01/27/2017 1044   CO2 25 10/20/2020 1351   CO2 28 01/27/2017 1044   CO2 27 06/05/2015 1036   BUN 12 10/20/2020 1351   BUN 15 06/28/2019 1501   BUN 14 01/27/2017 1044   BUN 13.1 06/05/2015 1036   CREATININE 1.12 10/20/2020 1351   CREATININE 1.15 10/14/2020 0815   CREATININE 1.0 06/05/2015 1036   GLU 152 03/30/2016 0000      Component Value Date/Time   CALCIUM 9.0 10/20/2020 1351   CALCIUM 8.9 01/27/2017 1044   CALCIUM 9.4 06/05/2015 1036   ALKPHOS 105 10/20/2020 1351   ALKPHOS 126 (H) 01/27/2017 1044   ALKPHOS 94 06/05/2015 1036   AST 16 10/20/2020 1351   AST 22 06/05/2015 1036   ALT 21 10/20/2020 1351   ALT 33 01/27/2017 1044   ALT 18 06/05/2015 1036   BILITOT 0.7 10/20/2020 1351   BILITOT 0.59 06/05/2015 1036       Impression and Plan: [name] [name] is a very pleasant 80  yo caucasian gentleman with history of IgG kappa myeloma.  He underwent standard induction chemotherapy and then ultimately stem cell transplant at [facility] in February 2017.   I think that when he starts his second cycle of Kyprolis, I am going to hold the Darzalex.  I think this would be reasonable to do to see if there is any change in his status.  I do not see that is going to hurt to hold the Darzalex.  I will have him come back next week so we can see how he is doing.  I know that there has  been some new treatments that have come out for myeloma.  I am just excited that we certainly have options.   [name], MD 9/6/20222:40 PM

## 2020-10-21 ENCOUNTER — Telehealth: Payer: Self-pay

## 2020-10-21 LAB — IGG, IGA, IGM
IgA: 10 mg/dL — ABNORMAL LOW (ref 61–437)
IgG (Immunoglobin G), Serum: 507 mg/dL — ABNORMAL LOW (ref 603–1613)
IgM (Immunoglobulin M), Srm: 14 mg/dL — ABNORMAL LOW (ref 15–143)

## 2020-10-21 LAB — KAPPA/LAMBDA LIGHT CHAINS
Kappa free light chain: 67 mg/L — ABNORMAL HIGH (ref 3.3–19.4)
Kappa, lambda light chain ratio: 26.8 — ABNORMAL HIGH (ref 0.26–1.65)
Lambda free light chains: 2.5 mg/L — ABNORMAL LOW (ref 5.7–26.3)

## 2020-10-21 NOTE — Telephone Encounter (Signed)
Called and informed patient of lab results, patient verbalized understanding and denies any questions or concerns at this time.   

## 2020-10-21 NOTE — Telephone Encounter (Signed)
-----   Message from [name], MD sent at 10/21/2020  3:05 PM EDT ----- Call - the Kappa light chain is down by 65%!!  This is great!!  [name]

## 2020-10-22 DIAGNOSIS — I2510 Atherosclerotic heart disease of native coronary artery without angina pectoris: Secondary | ICD-10-CM | POA: Diagnosis not present

## 2020-10-22 DIAGNOSIS — Z48812 Encounter for surgical aftercare following surgery on the circulatory system: Secondary | ICD-10-CM | POA: Diagnosis not present

## 2020-10-22 DIAGNOSIS — I252 Old myocardial infarction: Secondary | ICD-10-CM | POA: Diagnosis not present

## 2020-10-23 LAB — PROTEIN ELECTROPHORESIS, SERUM, WITH REFLEX
A/G Ratio: 1.5 (ref 0.7–1.7)
Albumin ELP: 3.4 g/dL (ref 2.9–4.4)
Alpha-1-Globulin: 0.2 g/dL (ref 0.0–0.4)
Alpha-2-Globulin: 0.8 g/dL (ref 0.4–1.0)
Beta Globulin: 1 g/dL (ref 0.7–1.3)
Gamma Globulin: 0.3 g/dL — ABNORMAL LOW (ref 0.4–1.8)
Globulin, Total: 2.3 g/dL (ref 2.2–3.9)
M-Spike, %: 0.1 g/dL — ABNORMAL HIGH
SPEP Interpretation: 0
Total Protein ELP: 5.7 g/dL — ABNORMAL LOW (ref 6.0–8.5)

## 2020-10-23 LAB — IMMUNOFIXATION REFLEX, SERUM
IgA: 9 mg/dL — ABNORMAL LOW (ref 61–437)
IgG (Immunoglobin G), Serum: 496 mg/dL — ABNORMAL LOW (ref 603–1613)
IgM (Immunoglobulin M), Srm: 13 mg/dL — ABNORMAL LOW (ref 15–143)

## 2020-10-26 DIAGNOSIS — I252 Old myocardial infarction: Secondary | ICD-10-CM | POA: Diagnosis not present

## 2020-10-26 DIAGNOSIS — I2510 Atherosclerotic heart disease of native coronary artery without angina pectoris: Secondary | ICD-10-CM | POA: Diagnosis not present

## 2020-10-26 DIAGNOSIS — Z48812 Encounter for surgical aftercare following surgery on the circulatory system: Secondary | ICD-10-CM | POA: Diagnosis not present

## 2020-10-27 DIAGNOSIS — I2510 Atherosclerotic heart disease of native coronary artery without angina pectoris: Secondary | ICD-10-CM | POA: Diagnosis not present

## 2020-10-27 DIAGNOSIS — Z48812 Encounter for surgical aftercare following surgery on the circulatory system: Secondary | ICD-10-CM | POA: Diagnosis not present

## 2020-10-27 DIAGNOSIS — I252 Old myocardial infarction: Secondary | ICD-10-CM | POA: Diagnosis not present

## 2020-10-29 DIAGNOSIS — I2510 Atherosclerotic heart disease of native coronary artery without angina pectoris: Secondary | ICD-10-CM | POA: Diagnosis not present

## 2020-10-29 DIAGNOSIS — I252 Old myocardial infarction: Secondary | ICD-10-CM | POA: Diagnosis not present

## 2020-10-29 DIAGNOSIS — Z48812 Encounter for surgical aftercare following surgery on the circulatory system: Secondary | ICD-10-CM | POA: Diagnosis not present

## 2020-10-30 ENCOUNTER — Inpatient Hospital Stay: Payer: Medicare Other

## 2020-10-30 ENCOUNTER — Encounter: Payer: Self-pay | Admitting: Hematology & Oncology

## 2020-10-30 ENCOUNTER — Other Ambulatory Visit: Payer: Self-pay

## 2020-10-30 ENCOUNTER — Inpatient Hospital Stay (HOSPITAL_BASED_OUTPATIENT_CLINIC_OR_DEPARTMENT_OTHER): Payer: Medicare Other | Admitting: Hematology & Oncology

## 2020-10-30 VITALS — BP 107/46 | HR 61 | Temp 97.9°F | Resp 20 | Wt 213.0 lb

## 2020-10-30 DIAGNOSIS — C9000 Multiple myeloma not having achieved remission: Secondary | ICD-10-CM

## 2020-10-30 DIAGNOSIS — Z7901 Long term (current) use of anticoagulants: Secondary | ICD-10-CM | POA: Diagnosis not present

## 2020-10-30 DIAGNOSIS — Z5112 Encounter for antineoplastic immunotherapy: Secondary | ICD-10-CM | POA: Diagnosis not present

## 2020-10-30 DIAGNOSIS — Z86718 Personal history of other venous thrombosis and embolism: Secondary | ICD-10-CM | POA: Diagnosis not present

## 2020-10-30 DIAGNOSIS — C9002 Multiple myeloma in relapse: Secondary | ICD-10-CM | POA: Diagnosis not present

## 2020-10-30 LAB — COMPREHENSIVE METABOLIC PANEL
ALT: 14 U/L (ref 0–44)
AST: 15 U/L (ref 15–41)
Albumin: 4 g/dL (ref 3.5–5.0)
Alkaline Phosphatase: 97 U/L (ref 38–126)
Anion gap: 8 (ref 5–15)
BUN: 17 mg/dL (ref 8–23)
CO2: 25 mmol/L (ref 22–32)
Calcium: 8.9 mg/dL (ref 8.9–10.3)
Chloride: 108 mmol/L (ref 98–111)
Creatinine, Ser: 1.17 mg/dL (ref 0.61–1.24)
GFR, Estimated: >60 mL/min (ref >60)
Glucose, Bld: 95 mg/dL (ref 70–99)
Potassium: 3.7 mmol/L (ref 3.5–5.1)
Sodium: 141 mmol/L (ref 135–145)
Total Bilirubin: 0.6 mg/dL (ref 0.3–1.2)
Total Protein: 6 g/dL — ABNORMAL LOW (ref 6.5–8.1)

## 2020-10-30 LAB — CBC WITH DIFFERENTIAL/PLATELET
Abs Immature Granulocytes: 0.01 10*3/uL (ref 0.00–0.07)
Basophils Absolute: 0 10*3/uL (ref 0.0–0.1)
Basophils Relative: 1 %
Eosinophils Absolute: 0 10*3/uL (ref 0.0–0.5)
Eosinophils Relative: 0 %
HCT: 35 % — ABNORMAL LOW (ref 39.0–52.0)
Hemoglobin: 11.6 g/dL — ABNORMAL LOW (ref 13.0–17.0)
Immature Granulocytes: 0 %
Lymphocytes Relative: 27 %
Lymphs Abs: 0.9 10*3/uL (ref 0.7–4.0)
MCH: 30.4 pg (ref 26.0–34.0)
MCHC: 33.1 g/dL (ref 30.0–36.0)
MCV: 91.9 fL (ref 80.0–100.0)
Monocytes Absolute: 0.6 10*3/uL (ref 0.1–1.0)
Monocytes Relative: 17 %
Neutro Abs: 1.9 10*3/uL (ref 1.7–7.7)
Neutrophils Relative %: 55 %
Platelets: 137 10*3/uL — ABNORMAL LOW (ref 150–400)
RBC: 3.81 MIL/uL — ABNORMAL LOW (ref 4.22–5.81)
RDW: 15.4 % (ref 11.5–15.5)
WBC: 3.5 10*3/uL — ABNORMAL LOW (ref 4.0–10.5)
nRBC: 0 % (ref 0.0–0.2)

## 2020-10-30 MED ORDER — SODIUM CHLORIDE 0.9 % IV SOLN: Freq: Once | INTRAVENOUS | Status: AC

## 2020-10-30 MED ORDER — DEXTROSE 5 % IV SOLN
52.5000 mg/m2 | Freq: Once | INTRAVENOUS | Status: AC
  Administered 2020-10-30: 120 mg via INTRAVENOUS
  Filled 2020-10-30: qty 60

## 2020-10-30 MED ORDER — SODIUM CHLORIDE 0.9 % IV SOLN
20.0000 mg | Freq: Once | INTRAVENOUS | Status: AC
  Administered 2020-10-30: 20 mg via INTRAVENOUS
  Filled 2020-10-30: qty 20

## 2020-10-30 MED ORDER — SODIUM CHLORIDE 0.9 % IV SOLN
Freq: Once | INTRAVENOUS | Status: AC
Start: 2020-10-30 — End: 2020-10-30

## 2020-10-30 NOTE — Progress Notes (Signed)
Hematology and Oncology Follow Up Visit  [name] [name] [id number] Mar 27, 1940 80 y.o. 10/30/2020   Principle Diagnosis:  IgG kappa myeloma - Relapsed -- 1q duplication Acute thromboembolism of the right lower leg   Past Therapy:        S/P ASCT at [place] on 04/02/2015 Patient s/p cycle 5 of Velcade/Revlimid/Decadron Revlimid '10mg'$  po q day (21/28) -- d/c on 09/15/2017   Current Therapy:     Faspro/Velcade/Decadron -- s/p cycle #6- start on 03/20/2020   -- d/c velcade and start Kyprolis on 09/27/2020 Zometa 4 mg IV every 2 months  - next dose due 10/2020 Xarelto 10 mg by mouth daily   Interim History:  [name] [name] is here today for follow-up.  His treatment is working nicely for him.  His last light chains were down by over half.  The Kappa light chain was 6.7 mg/dL.  Prior to this it was 19.1 mg/dL.  I am happy about this.  He is having back issues.  I know we have checked before.  I think he has arthritis in his back.  He also has had some hip issues.  He is eating well.  He is having no problems with nausea or vomiting.  He has had no problems with his congestive heart failure.  He is on 4 different medications for his heart.  He is also on a statin drug for his cardiac issues.  There is also might be causing some of the back and hip issues.  Currently, I would say his performance status is probably ECOG 1.     Medications:  Allergies as of 10/30/2020       Reactions   Budesonide-formoterol Fumarate Itching, Other (See Comments)   Codeine Itching   Hydrocodone-acetaminophen Itching   Mushroom Extract Complex Nausea And Vomiting, Other (See Comments)   Only shitake mushroom  causes this reaction. Flu-like symptoms from portabello mushrooms        Medication List        Accurate as of October 30, 2020 10:53 AM. If you have any questions, ask your nurse or doctor.          benzonatate 200 MG capsule Commonly known as: TESSALON Take 1 capsule (200 mg total) by  mouth 3 (three) times daily as needed for cough.   cetirizine 10 MG tablet Commonly known as: EQ Allergy Relief (Cetirizine) Take 1 tablet (10 mg total) by mouth daily.   clopidogrel 75 MG tablet Commonly known as: PLAVIX Take 75 mg by mouth daily.   Cyanocobalamin 1000 MCG Tbcr Take 1 tablet by mouth daily.   dexamethasone 4 MG tablet Commonly known as: DECADRON TAKE 5 TABLETS (20 MG TOTAL) BY MOUTH SEE ADMIN INSTRUCTIONS (TAKE 5 TABLETS ONCE ON THE DAY OF TREATMENT)   esomeprazole 40 MG capsule Commonly known as: NEXIUM Take 1 capsule (40 mg total) by mouth daily at 12 noon.   famciclovir 500 MG tablet Commonly known as: FAMVIR Take 1 tablet (500 mg total) by mouth daily.   fluticasone 50 MCG/ACT nasal spray Commonly known as: FLONASE Place 2 sprays into both nostrils daily.   furosemide 20 MG tablet Commonly known as: LASIX Take 20 mg by mouth daily as needed.   gabapentin 600 MG tablet Commonly known as: NEURONTIN Take 1,800 mg by mouth daily.   hydrocortisone 2.5 % ointment Apply topically 2 (two) times daily.   isosorbide mononitrate 60 MG 24 hr tablet Commonly known as: IMDUR Take 60 mg by mouth in the morning  and at bedtime.   levothyroxine 100 MCG tablet Commonly known as: Synthroid Take 1 tablet (100 mcg total) by mouth daily before breakfast.   LORazepam 1 MG tablet Commonly known as: ATIVAN Take 1 mg by mouth at bedtime.   metoprolol succinate 25 MG 24 hr tablet Commonly known as: TOPROL-XL Take 0.5 tablets (12.5 mg total) by mouth daily.   nitroGLYCERIN 0.4 MG SL tablet Commonly known as: NITROSTAT Place 1 tablet (0.4 mg total) under the tongue every 5 (five) minutes as needed. For chest pain   ondansetron 8 MG tablet Commonly known as: Zofran Take 1 tablet (8 mg total) by mouth 2 (two) times daily as needed (Nausea or vomiting).   oxyCODONE-acetaminophen 10-325 MG tablet Commonly known as: PERCOCET Take 1 tablet by mouth every 8 (eight)  hours as needed for pain. for pain   potassium chloride SA 20 MEQ tablet Commonly known as: KLOR-CON TAKE 1 TABLET TWICE DAILY   prochlorperazine 10 MG tablet Commonly known as: COMPAZINE Take 1 tablet (10 mg total) by mouth every 6 (six) hours as needed (Nausea or vomiting).   pyridoxine 100 MG tablet Commonly known as: B-6 Take 100 mg by mouth daily.   rivaroxaban 10 MG Tabs tablet Commonly known as: XARELTO Take 1 tablet (10 mg total) by mouth daily with supper.   rosuvastatin 40 MG tablet Commonly known as: CRESTOR Take 1 tablet (40 mg total) by mouth at bedtime.   sacubitril-valsartan 97-103 MG Commonly known as: ENTRESTO Take 1 tablet by mouth 2 (two) times daily.   tamsulosin 0.4 MG Caps capsule Commonly known as: FLOMAX Take 1 capsule (0.4 mg total) by mouth daily after supper.   Zoledronic Acid 4 MG Solr Inject into the vein.        Allergies:  Allergies  Allergen Reactions   Budesonide-Formoterol Fumarate Itching and Other (See Comments)   Codeine Itching   Hydrocodone-Acetaminophen Itching   Mushroom Extract Complex Nausea And Vomiting and Other (See Comments)    Only shitake mushroom  causes this reaction. Flu-like symptoms from portabello mushrooms    Past Medical History, Surgical history, Social history, and Family History were reviewed and updated.  Review of Systems: Review of Systems  HENT: Negative.    Eyes: Negative.   Respiratory: Negative.    Cardiovascular:  Positive for chest pain.  Gastrointestinal: Negative.   Genitourinary: Negative.   Musculoskeletal:  Positive for back pain.  Skin: Negative.   Neurological: Negative.   Endo/Heme/Allergies: Negative.   Psychiatric/Behavioral: Negative.      Physical Exam:  weight is 213 lb (96.6 kg). His oral temperature is 97.9 F (36.6 C). His blood pressure is 107/46 (abnormal) and his pulse is 61. His respiration is 20 and oxygen saturation is 99%.   Wt Readings from Last 3  Encounters:  10/30/20 213 lb (96.6 kg)  10/20/20 213 lb 8 oz (96.8 kg)  10/09/20 218 lb (98.9 kg)    Physical Exam Vitals reviewed.  HENT:     Head: Normocephalic and atraumatic.  Eyes:     Pupils: Pupils are equal, round, and reactive to light.  Cardiovascular:     Rate and Rhythm: Normal rate and regular rhythm.     Heart sounds: Normal heart sounds.  Pulmonary:     Effort: Pulmonary effort is normal.     Breath sounds: Normal breath sounds.  Abdominal:     General: Bowel sounds are normal.     Palpations: Abdomen is soft.  Musculoskeletal:  General: No tenderness or deformity. Normal range of motion.     Cervical back: Normal range of motion.  Lymphadenopathy:     Cervical: No cervical adenopathy.  Skin:    General: Skin is warm and dry.     Findings: No erythema or rash.  Neurological:     Mental Status: He is alert and oriented to person, place, and time.  Psychiatric:        Behavior: Behavior normal.        Thought Content: Thought content normal.        Judgment: Judgment normal.     Lab Results  Component Value Date   WBC 3.5 (L) 10/30/2020   HGB 11.6 (L) 10/30/2020   HCT 35.0 (L) 10/30/2020   MCV 91.9 10/30/2020   PLT 137 (L) 10/30/2020   Lab Results  Component Value Date   FERRITIN 68 09/22/2020   IRON 81 09/22/2020   TIBC 293 09/22/2020   UIBC 211 09/22/2020   IRONPCTSAT 28 09/22/2020   Lab Results  Component Value Date   RETICCTPCT 1.7 07/18/2019   RBC 3.81 (L) 10/30/2020   Lab Results  Component Value Date   KPAFRELGTCHN 67.0 (H) 10/20/2020   LAMBDASER 2.5 (L) 10/20/2020   KAPLAMBRATIO 26.80 (H) 10/20/2020   Lab Results  Component Value Date   IGGSERUM 496 (L) 10/20/2020   IGA 9 (L) 10/20/2020   IGMSERUM 13 (L) 10/20/2020   Lab Results  Component Value Date   TOTALPROTELP 5.7 (L) 10/20/2020   ALBUMINELP 3.4 10/20/2020   A1GS 0.2 10/20/2020   A2GS 0.8 10/20/2020   BETS 1.0 10/20/2020   BETA2SER 0.3 02/11/2015   GAMS 0.3  (L) 10/20/2020   MSPIKE 0.1 (H) 10/20/2020   SPEI Comment 09/22/2020     Chemistry      Component Value Date/Time   NA 141 10/30/2020 0951   NA 145 (H) 06/28/2019 1501   NA 140 01/27/2017 1044   NA 141 06/05/2015 1036   K 3.7 10/30/2020 0951   K 3.3 01/27/2017 1044   K 2.9 (LL) 06/05/2015 1036   CL 108 10/30/2020 0951   CL 101 01/27/2017 1044   CO2 25 10/30/2020 0951   CO2 28 01/27/2017 1044   CO2 27 06/05/2015 1036   BUN 17 10/30/2020 0951   BUN 15 06/28/2019 1501   BUN 14 01/27/2017 1044   BUN 13.1 06/05/2015 1036   CREATININE 1.17 10/30/2020 0951   CREATININE 1.12 10/20/2020 1351   CREATININE 1.15 10/14/2020 0815   CREATININE 1.0 06/05/2015 1036   GLU 152 03/30/2016 0000      Component Value Date/Time   CALCIUM 8.9 10/30/2020 0951   CALCIUM 8.9 01/27/2017 1044   CALCIUM 9.4 06/05/2015 1036   ALKPHOS 97 10/30/2020 0951   ALKPHOS 126 (H) 01/27/2017 1044   ALKPHOS 94 06/05/2015 1036   AST 15 10/30/2020 0951   AST 16 10/20/2020 1351   AST 22 06/05/2015 1036   ALT 14 10/30/2020 0951   ALT 21 10/20/2020 1351   ALT 33 01/27/2017 1044   ALT 18 06/05/2015 1036   BILITOT 0.6 10/30/2020 0951   BILITOT 0.7 10/20/2020 1351   BILITOT 0.59 06/05/2015 1036       Impression and Plan: [name] [name] is a very pleasant 80 yo caucasian gentleman with history of IgG kappa myeloma.  He underwent standard induction chemotherapy and then ultimately stem cell transplant at [facility] in February 2017.   I think that when he starts this second cycle  of Kyprolis, I am going to hold the Darzalex.  I think this would be reasonable to do to see if there is any change in his status.  I do not see that is going to hurt to hold the Darzalex.   [name], MD 9/16/202210:53 AM

## 2020-10-30 NOTE — Patient Instructions (Signed)
[facility] [place]  Discharge Instructions: Thank you for choosing [facility] to provide your oncology and hematology care.   If you have a lab appointment with the [facility], please go directly to the [facility] and check in at the registration area.  Wear comfortable clothing and clothing appropriate for easy access to any Portacath or PICC line.   We strive to give you quality time with your provider. You may need to reschedule your appointment if you arrive late (15 or more minutes).  Arriving late affects you and other patients whose appointments are after yours.  Also, if you miss three or more appointments without notifying the office, you may be dismissed from the clinic at the provider's discretion.      For prescription refill requests, have your pharmacy contact our office and allow 72 hours for refills to be completed.    Today you received the following chemotherapy and/or immunotherapy agents kyprolis    To help prevent nausea and vomiting after your treatment, we encourage you to take your nausea medication as directed.  BELOW ARE SYMPTOMS THAT SHOULD BE REPORTED IMMEDIATELY: *FEVER GREATER THAN 100.4 F (38 C) OR HIGHER *CHILLS OR SWEATING *NAUSEA AND VOMITING THAT IS NOT CONTROLLED WITH YOUR NAUSEA MEDICATION *UNUSUAL SHORTNESS OF BREATH *UNUSUAL BRUISING OR BLEEDING *URINARY PROBLEMS (pain or burning when urinating, or frequent urination) *BOWEL PROBLEMS (unusual diarrhea, constipation, pain near the anus) TENDERNESS IN MOUTH AND THROAT WITH OR WITHOUT PRESENCE OF ULCERS (sore throat, sores in mouth, or a toothache) UNUSUAL RASH, SWELLING OR PAIN  UNUSUAL VAGINAL DISCHARGE OR ITCHING   Items with * indicate a potential emergency and should be followed up as soon as possible or go to the Emergency Department if any problems should occur.  Please show the CHEMOTHERAPY ALERT CARD or IMMUNOTHERAPY ALERT CARD at check-in to the  Emergency Department and triage nurse. Should you have questions after your visit or need to cancel or reschedule your appointment, please contact [facility]  [phone] and follow the prompts.  Office hours are 8:00 a.m. to 4:30 p.m. Monday - Friday. Please note that voicemails left after 4:00 p.m. may not be returned until the following business day.  We are closed weekends and major holidays. You have access to a nurse at all times for urgent questions. Please call the main number to the clinic [phone] and follow the prompts.  For any non-urgent questions, you may also contact your provider using MyChart. We now offer e-Visits for anyone 80 and older to request care online for non-urgent symptoms. For details visit [email or web address].   Also download the MyChart app! Go to the app store, search "MyChart", open the app, select [facility], and log in with your MyChart username and password.  Due to Covid, a mask is required upon entering the hospital/clinic. If you do not have a mask, one will be given to you upon arrival. For doctor visits, patients may have 1 support person aged 34 or older with them. For treatment visits, patients cannot have anyone with them due to current Covid guidelines and our immunocompromised population.

## 2020-11-02 DIAGNOSIS — I2510 Atherosclerotic heart disease of native coronary artery without angina pectoris: Secondary | ICD-10-CM | POA: Diagnosis not present

## 2020-11-02 DIAGNOSIS — Z48812 Encounter for surgical aftercare following surgery on the circulatory system: Secondary | ICD-10-CM | POA: Diagnosis not present

## 2020-11-02 DIAGNOSIS — I252 Old myocardial infarction: Secondary | ICD-10-CM | POA: Diagnosis not present

## 2020-11-03 DIAGNOSIS — I252 Old myocardial infarction: Secondary | ICD-10-CM | POA: Diagnosis not present

## 2020-11-03 DIAGNOSIS — I2510 Atherosclerotic heart disease of native coronary artery without angina pectoris: Secondary | ICD-10-CM | POA: Diagnosis not present

## 2020-11-03 DIAGNOSIS — Z48812 Encounter for surgical aftercare following surgery on the circulatory system: Secondary | ICD-10-CM | POA: Diagnosis not present

## 2020-11-05 DIAGNOSIS — I252 Old myocardial infarction: Secondary | ICD-10-CM | POA: Diagnosis not present

## 2020-11-05 DIAGNOSIS — I2510 Atherosclerotic heart disease of native coronary artery without angina pectoris: Secondary | ICD-10-CM | POA: Diagnosis not present

## 2020-11-05 DIAGNOSIS — Z48812 Encounter for surgical aftercare following surgery on the circulatory system: Secondary | ICD-10-CM | POA: Diagnosis not present

## 2020-11-06 ENCOUNTER — Ambulatory Visit (INDEPENDENT_AMBULATORY_CARE_PROVIDER_SITE_OTHER): Payer: Medicare Other | Admitting: Sports Medicine

## 2020-11-06 ENCOUNTER — Inpatient Hospital Stay: Payer: Medicare Other

## 2020-11-06 ENCOUNTER — Other Ambulatory Visit: Payer: Self-pay

## 2020-11-06 VITALS — BP 129/76 | HR 86 | Ht 72.0 in | Wt 213.0 lb

## 2020-11-06 VITALS — BP 123/64 | HR 63 | Temp 98.0°F | Resp 19

## 2020-11-06 DIAGNOSIS — C9000 Multiple myeloma not having achieved remission: Secondary | ICD-10-CM

## 2020-11-06 DIAGNOSIS — M899 Disorder of bone, unspecified: Secondary | ICD-10-CM

## 2020-11-06 DIAGNOSIS — Z0000 Encounter for general adult medical examination without abnormal findings: Secondary | ICD-10-CM

## 2020-11-06 DIAGNOSIS — L989 Disorder of the skin and subcutaneous tissue, unspecified: Secondary | ICD-10-CM | POA: Diagnosis not present

## 2020-11-06 DIAGNOSIS — E7800 Pure hypercholesterolemia, unspecified: Secondary | ICD-10-CM

## 2020-11-06 DIAGNOSIS — Z9861 Coronary angioplasty status: Secondary | ICD-10-CM | POA: Diagnosis not present

## 2020-11-06 DIAGNOSIS — I2510 Atherosclerotic heart disease of native coronary artery without angina pectoris: Secondary | ICD-10-CM | POA: Diagnosis not present

## 2020-11-06 DIAGNOSIS — Z7901 Long term (current) use of anticoagulants: Secondary | ICD-10-CM | POA: Diagnosis not present

## 2020-11-06 DIAGNOSIS — C9002 Multiple myeloma in relapse: Secondary | ICD-10-CM | POA: Diagnosis not present

## 2020-11-06 DIAGNOSIS — C9001 Multiple myeloma in remission: Secondary | ICD-10-CM

## 2020-11-06 DIAGNOSIS — Z86718 Personal history of other venous thrombosis and embolism: Secondary | ICD-10-CM | POA: Diagnosis not present

## 2020-11-06 DIAGNOSIS — Z5112 Encounter for antineoplastic immunotherapy: Secondary | ICD-10-CM | POA: Diagnosis not present

## 2020-11-06 LAB — CBC WITH DIFFERENTIAL/PLATELET
Abs Immature Granulocytes: 0.01 10*3/uL (ref 0.00–0.07)
Basophils Absolute: 0 10*3/uL (ref 0.0–0.1)
Basophils Relative: 0 %
Eosinophils Absolute: 0 10*3/uL (ref 0.0–0.5)
Eosinophils Relative: 0 %
HCT: 35.8 % — ABNORMAL LOW (ref 39.0–52.0)
Hemoglobin: 12 g/dL — ABNORMAL LOW (ref 13.0–17.0)
Immature Granulocytes: 0 %
Lymphocytes Relative: 25 %
Lymphs Abs: 1.1 10*3/uL (ref 0.7–4.0)
MCH: 30.7 pg (ref 26.0–34.0)
MCHC: 33.5 g/dL (ref 30.0–36.0)
MCV: 91.6 fL (ref 80.0–100.0)
Monocytes Absolute: 0.7 10*3/uL (ref 0.1–1.0)
Monocytes Relative: 15 %
Neutro Abs: 2.5 10*3/uL (ref 1.7–7.7)
Neutrophils Relative %: 60 %
Platelets: 89 10*3/uL — ABNORMAL LOW (ref 150–400)
RBC: 3.91 MIL/uL — ABNORMAL LOW (ref 4.22–5.81)
RDW: 15.3 % (ref 11.5–15.5)
WBC: 4.2 10*3/uL (ref 4.0–10.5)
nRBC: 0 % (ref 0.0–0.2)

## 2020-11-06 LAB — COMPREHENSIVE METABOLIC PANEL
ALT: 14 U/L (ref 0–44)
AST: 15 U/L (ref 15–41)
Albumin: 4 g/dL (ref 3.5–5.0)
Alkaline Phosphatase: 83 U/L (ref 38–126)
Anion gap: 9 (ref 5–15)
BUN: 14 mg/dL (ref 8–23)
CO2: 29 mmol/L (ref 22–32)
Calcium: 9.2 mg/dL (ref 8.9–10.3)
Chloride: 104 mmol/L (ref 98–111)
Creatinine, Ser: 1.25 mg/dL — ABNORMAL HIGH (ref 0.61–1.24)
GFR, Estimated: 59 mL/min — ABNORMAL LOW (ref >60)
Glucose, Bld: 87 mg/dL (ref 70–99)
Potassium: 4 mmol/L (ref 3.5–5.1)
Sodium: 142 mmol/L (ref 135–145)
Total Bilirubin: 0.8 mg/dL (ref 0.3–1.2)
Total Protein: 6.1 g/dL — ABNORMAL LOW (ref 6.5–8.1)

## 2020-11-06 MED ORDER — SODIUM CHLORIDE 0.9 % IV SOLN
20.0000 mg | Freq: Once | INTRAVENOUS | Status: AC
  Administered 2020-11-06: 20 mg via INTRAVENOUS
  Filled 2020-11-06: qty 20

## 2020-11-06 MED ORDER — ZOLEDRONIC ACID 4 MG/100ML IV SOLN
4.0000 mg | Freq: Once | INTRAVENOUS | Status: AC
  Administered 2020-11-06: 4 mg via INTRAVENOUS
  Filled 2020-11-06: qty 100

## 2020-11-06 MED ORDER — DEXTROSE 5 % IV SOLN
56.0000 mg/m2 | Freq: Once | INTRAVENOUS | Status: AC
  Administered 2020-11-06: 120 mg via INTRAVENOUS
  Filled 2020-11-06: qty 60

## 2020-11-06 MED ORDER — SODIUM CHLORIDE 0.9 % IV SOLN: Freq: Once | INTRAVENOUS | Status: DC

## 2020-11-06 MED ORDER — TETANUS-DIPHTH-ACELL PERTUSSIS 5-2.5-18.5 LF-MCG/0.5 IM SUSY: 0.5000 mL | PREFILLED_SYRINGE | Freq: Once | INTRAMUSCULAR | 0 refills | Status: AC

## 2020-11-06 MED ORDER — REPATHA SURECLICK 140 MG/ML ~~LOC~~ SOAJ: 140.0000 mg | SUBCUTANEOUS | 11 refills | Status: DC

## 2020-11-06 MED ORDER — SODIUM CHLORIDE 0.9 % IV SOLN: Freq: Once | INTRAVENOUS | Status: AC

## 2020-11-06 MED ORDER — SHINGRIX 50 MCG/0.5ML IM SUSR: 0.5000 mL | Freq: Once | INTRAMUSCULAR | 0 refills | Status: AC

## 2020-11-06 NOTE — Assessment & Plan Note (Signed)
[name] [name] returns, he had an episode of cryotherapy with dermatology sometime ago, he is also on chemotherapy for multiple myeloma, he had a nonhealing ulcer right lateral malleolus without underlying bone pain, initially we kept this covered, it does look better today. I applied Dermabond, we gave him a few other pins, I think this will continue to heal, if after a 1 month follow-up its not healed we will consider biopsy to ensure this is not a squamous cell carcinoma.

## 2020-11-06 NOTE — Progress Notes (Signed)
Ok to treat with platelets of 89 per Dr Marin Olp. dph

## 2020-11-06 NOTE — Addendum Note (Signed)
Addended by: Silverio Decamp on: 11/06/2020 10:29 AM   Modules accepted: Orders

## 2020-11-06 NOTE — Patient Instructions (Signed)
Carfilzomib injection What is this medication? CARFILZOMIB (kar FILZ oh mib) targets a specific protein within cancer cells and stops the cancer cells from growing. It is used to treat multiple myeloma. This medicine may be used for other purposes; ask your health care provider or pharmacist if you have questions. COMMON BRAND NAME(S): KYPROLIS What should I tell my care team before I take this medication? They need to know if you have any of these conditions: heart disease history of blood clots irregular heartbeat kidney disease liver disease lung or breathing disease an unusual or allergic reaction to carfilzomib, or other medicines, foods, dyes, or preservatives pregnant or trying to get pregnant breast-feeding How should I use this medication? This medicine is for injection or infusion into a vein. It is given by a health care professional in a hospital or clinic setting. Talk to your pediatrician regarding the use of this medicine in children. Special care may be needed. Overdosage: If you think you have taken too much of this medicine contact a poison control center or emergency room at once. NOTE: This medicine is only for you. Do not share this medicine with others. What if I miss a dose? It is important not to miss your dose. Call your doctor or health care professional if you are unable to keep an appointment. What may interact with this medication? Interactions are not expected. This list may not describe all possible interactions. Give your health care provider a list of all the medicines, herbs, non-prescription drugs, or dietary supplements you use. Also tell them if you smoke, drink alcohol, or use illegal drugs. Some items may interact with your medicine. What should I watch for while using this medication? Your condition will be monitored while you are receiving this medicine. You may need blood work done while you are taking this medicine. Do not become pregnant while  taking this medicine or for 6 months after stopping it. Women should inform their health care provider if they wish to become pregnant or think they might be pregnant. Men should not father a child while taking this medicine and for 3 months after stopping it. There is a potential for serious side effects to an unborn child. Talk to your health care provider for more information. Do not breast-feed an infant while taking this medicine or for 2 weeks after stopping it. Check with your health care provider if you have severe diarrhea, nausea, and vomiting, or if you sweat a lot. The loss of too much body fluid may make it dangerous for you to take this medicine. You may get drowsy or dizzy. Do not drive, use machinery, or do anything that needs mental alertness until you know how this medicine affects you. Do not stand up or sit up quickly, especially if you are an older patient. This reduces the risk of dizzy or fainting spells. What side effects may I notice from receiving this medication? Side effects that you should report to your doctor or health care professional as soon as possible: allergic reactions like skin rash, itching or hives, swelling of the face, lips, or tongue confusion dizziness feeling faint or lightheaded fever or chills palpitations seizures signs and symptoms of bleeding such as bloody or black, tarry stools; red or dark-brown urine; spitting up blood or brown material that looks like coffee grounds; red spots on the skin; unusual bruising or bleeding including from the eye, gums, or nose signs and symptoms of a blood clot such as breathing problems; changes in  vision; chest pain; severe, sudden headache; pain, swelling, warmth in the leg; trouble speaking; sudden numbness or weakness of the face, arm or leg signs and symptoms of kidney injury like trouble passing urine or change in the amount of urine signs and symptoms of liver injury like dark yellow or brown urine; general  ill feeling or flu-like symptoms; light-colored stools; loss of appetite; nausea; right upper belly pain; unusually weak or tired; yellowing of the eyes or skin Side effects that usually do not require medical attention (report to your doctor or health care professional if they continue or are bothersome): back pain cough diarrhea headache muscle cramps trouble sleeping vomiting This list may not describe all possible side effects. Call your doctor for medical advice about side effects. You may report side effects to FDA at 1-800-FDA-1088. Where should I keep my medication? This drug is given in a hospital or clinic and will not be stored at home. NOTE: This sheet is a summary. It may not cover all possible information. If you have questions about this medicine, talk to your doctor, pharmacist, or health care provider.  2022 Elsevier/Gold Standard (2020-02-21 15:24:55) Zoledronic Acid Injection (Hypercalcemia, Oncology) What is this medication? ZOLEDRONIC ACID (ZOE le dron ik AS id) slows calcium loss from bones. It high calcium levels in the blood from some kinds of cancer. It may be used in other people at risk for bone loss. This medicine may be used for other purposes; ask your health care provider or pharmacist if you have questions. COMMON BRAND NAME(S): Zometa What should I tell my care team before I take this medication? They need to know if you have any of these conditions: cancer dehydration dental disease kidney disease liver disease low levels of calcium in the blood lung or breathing disease (asthma) receiving steroids like dexamethasone or prednisone an unusual or allergic reaction to zoledronic acid, other medicines, foods, dyes, or preservatives pregnant or trying to get pregnant breast-feeding How should I use this medication? This drug is injected into a vein. It is given by a health care provider in a hospital or clinic setting. Talk to your health care provider  about the use of this drug in children. Special care may be needed. Overdosage: If you think you have taken too much of this medicine contact a poison control center or emergency room at once. NOTE: This medicine is only for you. Do not share this medicine with others. What if I miss a dose? Keep appointments for follow-up doses. It is important not to miss your dose. Call your health care provider if you are unable to keep an appointment. What may interact with this medication? certain antibiotics given by injection NSAIDs, medicines for pain and inflammation, like ibuprofen or naproxen some diuretics like bumetanide, furosemide teriparatide thalidomide This list may not describe all possible interactions. Give your health care provider a list of all the medicines, herbs, non-prescription drugs, or dietary supplements you use. Also tell them if you smoke, drink alcohol, or use illegal drugs. Some items may interact with your medicine. What should I watch for while using this medication? Visit your health care provider for regular checks on your progress. It may be some time before you see the benefit from this drug. Some people who take this drug have severe bone, joint, or muscle pain. This drug may also increase your risk for jaw problems or a broken thigh bone. Tell your health care provider right away if you have severe pain in your  jaw, bones, joints, or muscles. Tell you health care provider if you have any pain that does not go away or that gets worse. Tell your dentist and dental surgeon that you are taking this drug. You should not have major dental surgery while on this drug. See your dentist to have a dental exam and fix any dental problems before starting this drug. Take good care of your teeth while on this drug. Make sure you see your dentist for regular follow-up appointments. You should make sure you get enough calcium and vitamin D while you are taking this drug. Discuss the foods  you eat and the vitamins you take with your health care provider. Check with your health care provider if you have severe diarrhea, nausea, and vomiting, or if you sweat a lot. The loss of too much body fluid may make it dangerous for you to take this drug. You may need blood work done while you are taking this drug. Do not become pregnant while taking this drug. Women should inform their health care provider if they wish to become pregnant or think they might be pregnant. There is potential for serious harm to an unborn child. Talk to your health care provider for more information. What side effects may I notice from receiving this medication? Side effects that you should report to your doctor or health care provider as soon as possible: allergic reactions (skin rash, itching or hives; swelling of the face, lips, or tongue) bone pain infection (fever, chills, cough, sore throat, pain or trouble passing urine) jaw pain, especially after dental work joint pain kidney injury (trouble passing urine or change in the amount of urine) low blood pressure (dizziness; feeling faint or lightheaded, falls; unusually weak or tired) low calcium levels (fast heartbeat; muscle cramps or pain; pain, tingling, or numbness in the hands or feet; seizures) low magnesium levels (fast, irregular heartbeat; muscle cramp or pain; muscle weakness; tremors; seizures) low red blood cell counts (trouble breathing; feeling faint; lightheaded, falls; unusually weak or tired) muscle pain redness, blistering, peeling, or loosening of the skin, including inside the mouth severe diarrhea swelling of the ankles, feet, hands trouble breathing Side effects that usually do not require medical attention (report to your doctor or health care provider if they continue or are bothersome): anxious constipation coughing depressed mood eye irritation, itching, or pain fever general ill feeling or flu-like symptoms nausea pain,  redness, or irritation at site where injected trouble sleeping This list may not describe all possible side effects. Call your doctor for medical advice about side effects. You may report side effects to FDA at 1-800-FDA-1088. Where should I keep my medication? This drug is given in a hospital or clinic. It will not be stored at home. NOTE: This sheet is a summary. It may not cover all possible information. If you have questions about this medicine, talk to your doctor, pharmacist, or health care provider.  2022 Elsevier/Gold Standard (2018-11-15 09:13:00)

## 2020-11-06 NOTE — Assessment & Plan Note (Addendum)
We restarted his Crestor, unfortunately his pains came back, switching to Repatha. He does have atherosclerotic cardiovascular disease (ASCVD) as well as intolerance to statins.

## 2020-11-06 NOTE — Assessment & Plan Note (Signed)
Tdap and Shingrix sent to his pharmacy last month, he will be sure to get these done.

## 2020-11-06 NOTE — Progress Notes (Signed)
    Procedures performed today:    None.  Independent interpretation of notes and tests performed by another provider:   None.  Brief History, Exam, Impression, and Recommendations:    Skin lesion Raygen returns, he had an episode of cryotherapy with dermatology sometime ago, he is also on chemotherapy for multiple myeloma, he had a nonhealing ulcer right lateral malleolus without underlying bone pain, initially we kept this covered, it does look better today. I applied Dermabond, we gave him a few other pins, I think this will continue to heal, if after a 1 month follow-up its not healed we will consider biopsy to ensure this is not a squamous cell carcinoma.  Hyperlipidemia We restarted his Crestor, unfortunately his pains came back, switching to Repatha. He does have atherosclerotic cardiovascular disease (ASCVD) as well as intolerance to statins.  Annual physical exam Tdap and Shingrix sent to his pharmacy last month, he will be sure to get these done.    ___________________________________________ Gwen Her. Dianah Field, M.D., ABFM., CAQSM. Primary Care and [place] Instructor of [place] of [facility] of Medicine

## 2020-11-09 DIAGNOSIS — I2510 Atherosclerotic heart disease of native coronary artery without angina pectoris: Secondary | ICD-10-CM | POA: Diagnosis not present

## 2020-11-09 DIAGNOSIS — I255 Ischemic cardiomyopathy: Secondary | ICD-10-CM | POA: Diagnosis not present

## 2020-11-09 DIAGNOSIS — I259 Chronic ischemic heart disease, unspecified: Secondary | ICD-10-CM | POA: Diagnosis not present

## 2020-11-09 DIAGNOSIS — I10 Essential (primary) hypertension: Secondary | ICD-10-CM | POA: Diagnosis not present

## 2020-11-10 DIAGNOSIS — Z48812 Encounter for surgical aftercare following surgery on the circulatory system: Secondary | ICD-10-CM | POA: Diagnosis not present

## 2020-11-10 DIAGNOSIS — I2510 Atherosclerotic heart disease of native coronary artery without angina pectoris: Secondary | ICD-10-CM | POA: Diagnosis not present

## 2020-11-10 DIAGNOSIS — I252 Old myocardial infarction: Secondary | ICD-10-CM | POA: Diagnosis not present

## 2020-11-12 DIAGNOSIS — Z48812 Encounter for surgical aftercare following surgery on the circulatory system: Secondary | ICD-10-CM | POA: Diagnosis not present

## 2020-11-12 DIAGNOSIS — I2510 Atherosclerotic heart disease of native coronary artery without angina pectoris: Secondary | ICD-10-CM | POA: Diagnosis not present

## 2020-11-12 DIAGNOSIS — I252 Old myocardial infarction: Secondary | ICD-10-CM | POA: Diagnosis not present

## 2020-11-13 ENCOUNTER — Inpatient Hospital Stay: Payer: Medicare Other

## 2020-11-13 ENCOUNTER — Other Ambulatory Visit: Payer: Self-pay | Admitting: *Deleted

## 2020-11-13 ENCOUNTER — Other Ambulatory Visit: Payer: Self-pay

## 2020-11-13 VITALS — BP 118/53 | HR 58 | Temp 98.8°F | Resp 18

## 2020-11-13 DIAGNOSIS — Z5112 Encounter for antineoplastic immunotherapy: Secondary | ICD-10-CM | POA: Diagnosis not present

## 2020-11-13 DIAGNOSIS — C9000 Multiple myeloma not having achieved remission: Secondary | ICD-10-CM

## 2020-11-13 DIAGNOSIS — Z7901 Long term (current) use of anticoagulants: Secondary | ICD-10-CM | POA: Diagnosis not present

## 2020-11-13 DIAGNOSIS — C9002 Multiple myeloma in relapse: Secondary | ICD-10-CM | POA: Diagnosis not present

## 2020-11-13 DIAGNOSIS — Z86718 Personal history of other venous thrombosis and embolism: Secondary | ICD-10-CM | POA: Diagnosis not present

## 2020-11-13 LAB — CBC WITH DIFFERENTIAL (CANCER CENTER ONLY)
Abs Immature Granulocytes: 0 10*3/uL (ref 0.00–0.07)
Basophils Absolute: 0 10*3/uL (ref 0.0–0.1)
Basophils Relative: 0 %
Eosinophils Absolute: 0 10*3/uL (ref 0.0–0.5)
Eosinophils Relative: 0 %
HCT: 35.7 % — ABNORMAL LOW (ref 39.0–52.0)
Hemoglobin: 11.6 g/dL — ABNORMAL LOW (ref 13.0–17.0)
Immature Granulocytes: 0 %
Lymphocytes Relative: 19 %
Lymphs Abs: 0.9 10*3/uL (ref 0.7–4.0)
MCH: 30.2 pg (ref 26.0–34.0)
MCHC: 32.5 g/dL (ref 30.0–36.0)
MCV: 93 fL (ref 80.0–100.0)
Monocytes Absolute: 0.7 10*3/uL (ref 0.1–1.0)
Monocytes Relative: 15 %
Neutro Abs: 2.9 10*3/uL (ref 1.7–7.7)
Neutrophils Relative %: 66 %
Platelet Count: 110 10*3/uL — ABNORMAL LOW (ref 150–400)
RBC: 3.84 MIL/uL — ABNORMAL LOW (ref 4.22–5.81)
RDW: 15.8 % — ABNORMAL HIGH (ref 11.5–15.5)
WBC Count: 4.5 10*3/uL (ref 4.0–10.5)
nRBC: 0 % (ref 0.0–0.2)

## 2020-11-13 LAB — COMPREHENSIVE METABOLIC PANEL
ALT: 13 U/L (ref 0–44)
AST: 14 U/L — ABNORMAL LOW (ref 15–41)
Albumin: 4.1 g/dL (ref 3.5–5.0)
Alkaline Phosphatase: 96 U/L (ref 38–126)
Anion gap: 8 (ref 5–15)
BUN: 22 mg/dL (ref 8–23)
CO2: 25 mmol/L (ref 22–32)
Calcium: 9.3 mg/dL (ref 8.9–10.3)
Chloride: 108 mmol/L (ref 98–111)
Creatinine, Ser: 1.85 mg/dL — ABNORMAL HIGH (ref 0.61–1.24)
GFR, Estimated: 37 mL/min — ABNORMAL LOW (ref >60)
Glucose, Bld: 73 mg/dL (ref 70–99)
Potassium: 3.8 mmol/L (ref 3.5–5.1)
Sodium: 141 mmol/L (ref 135–145)
Total Bilirubin: 0.7 mg/dL (ref 0.3–1.2)
Total Protein: 6.4 g/dL — ABNORMAL LOW (ref 6.5–8.1)

## 2020-11-13 MED ORDER — SODIUM CHLORIDE 0.9 % IV SOLN: Freq: Once | INTRAVENOUS | Status: AC

## 2020-11-13 MED ORDER — HEPARIN SOD (PORK) LOCK FLUSH 100 UNIT/ML IV SOLN: 500.0000 [IU] | Freq: Once | INTRAVENOUS | Status: DC | PRN

## 2020-11-13 MED ORDER — DEXTROSE 5 % IV SOLN
70.0000 mg/m2 | Freq: Once | INTRAVENOUS | Status: AC
  Administered 2020-11-13: 150 mg via INTRAVENOUS
  Filled 2020-11-13: qty 60

## 2020-11-13 MED ORDER — SODIUM CHLORIDE 0.9 % IV SOLN
20.0000 mg | Freq: Once | INTRAVENOUS | Status: AC
  Administered 2020-11-13: 20 mg via INTRAVENOUS
  Filled 2020-11-13: qty 20

## 2020-11-13 MED ORDER — SODIUM CHLORIDE 0.9% FLUSH
10.0000 mL | INTRAVENOUS | Status: DC | PRN
Start: 2020-11-13 — End: 2020-11-13

## 2020-11-13 NOTE — Patient Instructions (Signed)
[facility]  Discharge Instructions: Thank you for choosing [facility] to provide your oncology and hematology care.   If you have a lab appointment with the [facility], please go directly to the [facility] and check in at the registration area.  Wear comfortable clothing and clothing appropriate for easy access to any Portacath or PICC line.   We strive to give you quality time with your provider. You may need to reschedule your appointment if you arrive late (15 or more minutes).  Arriving late affects you and other patients whose appointments are after yours.  Also, if you miss three or more appointments without notifying the office, you may be dismissed from the clinic at the provider's discretion.      For prescription refill requests, have your pharmacy contact our office and allow 72 hours for refills to be completed.    Today you received the following chemotherapy and/or immunotherapy agents  kyprolis    To help prevent nausea and vomiting after your treatment, we encourage you to take your nausea medication as directed.  BELOW ARE SYMPTOMS THAT SHOULD BE REPORTED IMMEDIATELY: *FEVER GREATER THAN 100.4 F (38 C) OR HIGHER *CHILLS OR SWEATING *NAUSEA AND VOMITING THAT IS NOT CONTROLLED WITH YOUR NAUSEA MEDICATION *UNUSUAL SHORTNESS OF BREATH *UNUSUAL BRUISING OR BLEEDING *URINARY PROBLEMS (pain or burning when urinating, or frequent urination) *BOWEL PROBLEMS (unusual diarrhea, constipation, pain near the anus) TENDERNESS IN MOUTH AND THROAT WITH OR WITHOUT PRESENCE OF ULCERS (sore throat, sores in mouth, or a toothache) UNUSUAL RASH, SWELLING OR PAIN  UNUSUAL VAGINAL DISCHARGE OR ITCHING   Items with * indicate a potential emergency and should be followed up as soon as possible or go to the Emergency Department if any problems should occur.  Please show the CHEMOTHERAPY ALERT CARD or IMMUNOTHERAPY ALERT CARD at check-in to the  Emergency Department and triage nurse. Should you have questions after your visit or need to cancel or reschedule your appointment, please contact [facility]  [phone] and follow the prompts.  Office hours are 8:00 a.m. to 4:30 p.m. Monday - Friday. Please note that voicemails left after 4:00 p.m. may not be returned until the following business day.  We are closed weekends and major holidays. You have access to a nurse at all times for urgent questions. Please call the main number to the clinic [phone] and follow the prompts.  For any non-urgent questions, you may also contact your provider using MyChart. We now offer e-Visits for anyone 63 and older to request care online for non-urgent symptoms. For details visit [email or web address].   Also download the MyChart app! Go to the app store, search "MyChart", open the app, select [facility], and log in with your MyChart username and password.  Due to Covid, a mask is required upon entering the hospital/clinic. If you do not have a mask, one will be given to you upon arrival. For doctor visits, patients may have 1 support person aged 51 or older with them. For treatment visits, patients cannot have anyone with them due to current Covid guidelines and our immunocompromised population.

## 2020-11-13 NOTE — Progress Notes (Signed)
Okay to treat today with SCr 1.75 per Dr. Marin Olp.

## 2020-11-16 DIAGNOSIS — I214 Non-ST elevation (NSTEMI) myocardial infarction: Secondary | ICD-10-CM | POA: Diagnosis not present

## 2020-11-16 DIAGNOSIS — Z48812 Encounter for surgical aftercare following surgery on the circulatory system: Secondary | ICD-10-CM | POA: Diagnosis not present

## 2020-11-16 DIAGNOSIS — Z87891 Personal history of nicotine dependence: Secondary | ICD-10-CM | POA: Diagnosis not present

## 2020-11-16 DIAGNOSIS — I2510 Atherosclerotic heart disease of native coronary artery without angina pectoris: Secondary | ICD-10-CM | POA: Diagnosis not present

## 2020-11-17 DIAGNOSIS — I2510 Atherosclerotic heart disease of native coronary artery without angina pectoris: Secondary | ICD-10-CM | POA: Diagnosis not present

## 2020-11-17 DIAGNOSIS — Z48812 Encounter for surgical aftercare following surgery on the circulatory system: Secondary | ICD-10-CM | POA: Diagnosis not present

## 2020-11-17 DIAGNOSIS — I214 Non-ST elevation (NSTEMI) myocardial infarction: Secondary | ICD-10-CM | POA: Diagnosis not present

## 2020-11-17 DIAGNOSIS — Z87891 Personal history of nicotine dependence: Secondary | ICD-10-CM | POA: Diagnosis not present

## 2020-11-17 NOTE — Telephone Encounter (Signed)
No clue, would need to see him.

## 2020-11-19 DIAGNOSIS — I214 Non-ST elevation (NSTEMI) myocardial infarction: Secondary | ICD-10-CM | POA: Diagnosis not present

## 2020-11-19 DIAGNOSIS — Z87891 Personal history of nicotine dependence: Secondary | ICD-10-CM | POA: Diagnosis not present

## 2020-11-19 DIAGNOSIS — I080 Rheumatic disorders of both mitral and aortic valves: Secondary | ICD-10-CM | POA: Diagnosis not present

## 2020-11-19 DIAGNOSIS — I77810 Thoracic aortic ectasia: Secondary | ICD-10-CM | POA: Diagnosis not present

## 2020-11-19 DIAGNOSIS — I2510 Atherosclerotic heart disease of native coronary artery without angina pectoris: Secondary | ICD-10-CM | POA: Diagnosis not present

## 2020-11-19 DIAGNOSIS — Z48812 Encounter for surgical aftercare following surgery on the circulatory system: Secondary | ICD-10-CM | POA: Diagnosis not present

## 2020-11-23 DIAGNOSIS — I2510 Atherosclerotic heart disease of native coronary artery without angina pectoris: Secondary | ICD-10-CM | POA: Diagnosis not present

## 2020-11-23 DIAGNOSIS — I214 Non-ST elevation (NSTEMI) myocardial infarction: Secondary | ICD-10-CM | POA: Diagnosis not present

## 2020-11-23 DIAGNOSIS — Z87891 Personal history of nicotine dependence: Secondary | ICD-10-CM | POA: Diagnosis not present

## 2020-11-23 DIAGNOSIS — Z48812 Encounter for surgical aftercare following surgery on the circulatory system: Secondary | ICD-10-CM | POA: Diagnosis not present

## 2020-11-24 DIAGNOSIS — I2510 Atherosclerotic heart disease of native coronary artery without angina pectoris: Secondary | ICD-10-CM | POA: Diagnosis not present

## 2020-11-24 DIAGNOSIS — Z48812 Encounter for surgical aftercare following surgery on the circulatory system: Secondary | ICD-10-CM | POA: Diagnosis not present

## 2020-11-24 DIAGNOSIS — I214 Non-ST elevation (NSTEMI) myocardial infarction: Secondary | ICD-10-CM | POA: Diagnosis not present

## 2020-11-24 DIAGNOSIS — Z87891 Personal history of nicotine dependence: Secondary | ICD-10-CM | POA: Diagnosis not present

## 2020-11-25 ENCOUNTER — Ambulatory Visit (INDEPENDENT_AMBULATORY_CARE_PROVIDER_SITE_OTHER): Payer: Medicare Other | Admitting: Sports Medicine

## 2020-11-25 ENCOUNTER — Encounter: Payer: Self-pay | Admitting: Sports Medicine

## 2020-11-25 VITALS — BP 111/60 | HR 74

## 2020-11-25 DIAGNOSIS — N189 Chronic kidney disease, unspecified: Secondary | ICD-10-CM | POA: Diagnosis not present

## 2020-11-25 DIAGNOSIS — R252 Cramp and spasm: Secondary | ICD-10-CM | POA: Insufficient documentation

## 2020-11-25 DIAGNOSIS — Z9861 Coronary angioplasty status: Secondary | ICD-10-CM

## 2020-11-25 DIAGNOSIS — E538 Deficiency of other specified B group vitamins: Secondary | ICD-10-CM

## 2020-11-25 DIAGNOSIS — N289 Disorder of kidney and ureter, unspecified: Secondary | ICD-10-CM | POA: Diagnosis not present

## 2020-11-25 DIAGNOSIS — I2510 Atherosclerotic heart disease of native coronary artery without angina pectoris: Secondary | ICD-10-CM | POA: Diagnosis not present

## 2020-11-25 DIAGNOSIS — L989 Disorder of the skin and subcutaneous tissue, unspecified: Secondary | ICD-10-CM

## 2020-11-25 MED ORDER — DOXYCYCLINE HYCLATE 100 MG PO TABS: 100.0000 mg | ORAL_TABLET | Freq: Two times a day (BID) | ORAL | 0 refills | Status: DC

## 2020-11-25 NOTE — Assessment & Plan Note (Signed)
Skin lesion on the right lower leg has completely healed, this was after a session of cryotherapy with dermatology. He has a new lesion on the left hand, dorsal at the base of the fifth metacarpal, it is tender, erythematous, dome-shaped and appears to be folliculitis. Adding a course of doxycycline. If this does not resolve over the next month we will do at least a shave biopsy, as the dome-shaped appearance can be seen in basal cell carcinomas.

## 2020-11-25 NOTE — Progress Notes (Addendum)
    Procedures performed today:    None.  Independent interpretation of notes and tests performed by another provider:   None.  Brief History, Exam, Impression, and Recommendations:    Jerking movements of extremities This is a pleasant 80 year old male, currently undergoing chemo/immunotherapy for multiple myeloma. He is on a new chemotherapy agent Kyprolis. Over a week he has noted increasing jerking sensations in both upper extremities and lower extremities, this occurred after his chemotherapy session. He does get steroids during his chemotherapy session and he has taken antinausea medicines in the past. On exam today he does not really have significant jerks however there is a benign essential tremor, symmetric and bilateral. Tells me symptoms are overall improving. I am unsure as to what is going on here, he does have a history of hypercalcemia, we will check some routine labs today, I would also recommend that we avoid antipsychotic based antiemetics. His underlying peripheral neuropathy could also be at play here. Ondansetron is okay. We will watch this for now but if not completely resolved in a couple of months we will refer to neurology.  Update: Elevated PTH and low calcium, his hypocalcemia likely related to his renal insufficiency and multiple myeloma is the likely cause of his muscle twitching.  I would like an opinion from nephrology here.  Skin lesion Skin lesion on the right lower leg has completely healed, this was after a session of cryotherapy with dermatology. He has a new lesion on the left hand, dorsal at the base of the fifth metacarpal, it is tender, erythematous, dome-shaped and appears to be folliculitis. Adding a course of doxycycline. If this does not resolve over the next month we will do at least a shave biopsy, as the dome-shaped appearance can be seen in basal cell carcinomas.  Acute on chronic renal insufficiency Patient presented with on jerking  movements of his hands, the only abnormality likely relevant on lab work was worsening renal function, we will get a renal ultrasound, the differential is broad including myeloma kidney, renal insufficiency from [place], Entresto, less likely Lasix as the picture is not prerenal, I would like nephrology to weigh in.    ___________________________________________ Gwen Her. Dianah Field, M.D., ABFM., CAQSM. Primary Care and [place] Instructor of [place] of [facility] of Medicine

## 2020-11-25 NOTE — Assessment & Plan Note (Addendum)
This is a pleasant 80 year old male, currently undergoing chemo/immunotherapy for multiple myeloma. He is on a new chemotherapy agent Kyprolis. Over a week he has noted increasing jerking sensations in both upper extremities and lower extremities, this occurred after his chemotherapy session. He does get steroids during his chemotherapy session and he has taken antinausea medicines in the past. On exam today he does not really have significant jerks however there is a benign essential tremor, symmetric and bilateral. Tells me symptoms are overall improving. I am unsure as to what is going on here, he does have a history of hypercalcemia, we will check some routine labs today, I would also recommend that we avoid antipsychotic based antiemetics. His underlying peripheral neuropathy could also be at play here. Ondansetron is okay. We will watch this for now but if not completely resolved in a couple of months we will refer to neurology.  Update: Elevated PTH and low calcium, his hypocalcemia likely related to his renal insufficiency and multiple myeloma is the likely cause of his muscle twitching.  I would like an opinion from nephrology here.

## 2020-11-26 ENCOUNTER — Encounter: Payer: Self-pay | Admitting: Hematology & Oncology

## 2020-11-26 ENCOUNTER — Encounter: Payer: Self-pay | Admitting: Sports Medicine

## 2020-11-26 DIAGNOSIS — I2510 Atherosclerotic heart disease of native coronary artery without angina pectoris: Secondary | ICD-10-CM | POA: Diagnosis not present

## 2020-11-26 DIAGNOSIS — N189 Chronic kidney disease, unspecified: Secondary | ICD-10-CM | POA: Insufficient documentation

## 2020-11-26 DIAGNOSIS — Z48812 Encounter for surgical aftercare following surgery on the circulatory system: Secondary | ICD-10-CM | POA: Diagnosis not present

## 2020-11-26 DIAGNOSIS — Z87891 Personal history of nicotine dependence: Secondary | ICD-10-CM | POA: Diagnosis not present

## 2020-11-26 DIAGNOSIS — I214 Non-ST elevation (NSTEMI) myocardial infarction: Secondary | ICD-10-CM | POA: Diagnosis not present

## 2020-11-26 DIAGNOSIS — N289 Disorder of kidney and ureter, unspecified: Secondary | ICD-10-CM | POA: Insufficient documentation

## 2020-11-26 HISTORY — DX: Chronic kidney disease, unspecified: N18.9

## 2020-11-26 LAB — COMPREHENSIVE METABOLIC PANEL
AG Ratio: 2.4 (calc) (ref 1.0–2.5)
ALT: 16 U/L (ref 9–46)
AST: 19 U/L (ref 10–35)
Albumin: 4 g/dL (ref 3.6–5.1)
Alkaline phosphatase (APISO): 99 U/L (ref 35–144)
BUN/Creatinine Ratio: 11 (calc) (ref 6–22)
BUN: 27 mg/dL — ABNORMAL HIGH (ref 7–25)
CO2: 21 mmol/L (ref 20–32)
Calcium: 8.1 mg/dL — ABNORMAL LOW (ref 8.6–10.3)
Chloride: 114 mmol/L — ABNORMAL HIGH (ref 98–110)
Creat: 2.49 mg/dL — ABNORMAL HIGH (ref 0.70–1.22)
Globulin: 1.7 g/dL (calc) — ABNORMAL LOW (ref 1.9–3.7)
Glucose, Bld: 94 mg/dL (ref 65–99)
Potassium: 4.7 mmol/L (ref 3.5–5.3)
Sodium: 143 mmol/L (ref 135–146)
Total Bilirubin: 0.7 mg/dL (ref 0.2–1.2)
Total Protein: 5.7 g/dL — ABNORMAL LOW (ref 6.1–8.1)

## 2020-11-26 LAB — CBC
HCT: 31.5 % — ABNORMAL LOW (ref 38.5–50.0)
Hemoglobin: 10.4 g/dL — ABNORMAL LOW (ref 13.2–17.1)
MCH: 30.5 pg (ref 27.0–33.0)
MCHC: 33 g/dL (ref 32.0–36.0)
MCV: 92.4 fL (ref 80.0–100.0)
MPV: 9.6 fL (ref 7.5–12.5)
Platelets: 191 10*3/uL (ref 140–400)
RBC: 3.41 10*6/uL — ABNORMAL LOW (ref 4.20–5.80)
RDW: 14.4 % (ref 11.0–15.0)
WBC: 3.2 10*3/uL — ABNORMAL LOW (ref 3.8–10.8)

## 2020-11-26 LAB — TSH: TSH: 3.02 mIU/L (ref 0.40–4.50)

## 2020-11-26 LAB — VITAMIN B12: Vitamin B-12: 829 pg/mL (ref 200–1100)

## 2020-11-26 LAB — PTH, INTACT AND CALCIUM
Calcium: 8.1 mg/dL — ABNORMAL LOW (ref 8.6–10.3)
PTH: 200 pg/mL — ABNORMAL HIGH (ref 16–77)

## 2020-11-26 NOTE — Addendum Note (Signed)
Addended by: Silverio Decamp on: 11/26/2020 08:53 AM   Modules accepted: Orders

## 2020-11-26 NOTE — Assessment & Plan Note (Addendum)
Patient presented with on jerking movements of his hands, the only abnormality likely relevant on lab work was worsening renal function, we will get a renal ultrasound, the differential is broad including myeloma kidney, renal insufficiency from [place], Entresto, less likely Lasix as the picture is not prerenal, I would like nephrology to weigh in.

## 2020-11-27 ENCOUNTER — Inpatient Hospital Stay: Payer: Medicare Other

## 2020-11-27 ENCOUNTER — Inpatient Hospital Stay: Payer: Medicare Other | Attending: Hematology & Oncology

## 2020-11-27 ENCOUNTER — Inpatient Hospital Stay (HOSPITAL_BASED_OUTPATIENT_CLINIC_OR_DEPARTMENT_OTHER): Payer: Medicare Other | Admitting: Hematology & Oncology

## 2020-11-27 ENCOUNTER — Telehealth: Payer: Self-pay

## 2020-11-27 ENCOUNTER — Encounter: Payer: Self-pay | Admitting: Hematology & Oncology

## 2020-11-27 ENCOUNTER — Other Ambulatory Visit: Payer: Self-pay

## 2020-11-27 VITALS — BP 121/53 | HR 63 | Temp 97.9°F | Resp 18 | Wt 210.0 lb

## 2020-11-27 DIAGNOSIS — C9000 Multiple myeloma not having achieved remission: Secondary | ICD-10-CM

## 2020-11-27 DIAGNOSIS — Z5112 Encounter for antineoplastic immunotherapy: Secondary | ICD-10-CM | POA: Diagnosis not present

## 2020-11-27 DIAGNOSIS — Z7901 Long term (current) use of anticoagulants: Secondary | ICD-10-CM | POA: Insufficient documentation

## 2020-11-27 DIAGNOSIS — Z86718 Personal history of other venous thrombosis and embolism: Secondary | ICD-10-CM | POA: Insufficient documentation

## 2020-11-27 DIAGNOSIS — C9002 Multiple myeloma in relapse: Secondary | ICD-10-CM | POA: Insufficient documentation

## 2020-11-27 LAB — CMP (CANCER CENTER ONLY)
ALT: 15 U/L (ref 0–44)
AST: 17 U/L (ref 15–41)
Albumin: 4.2 g/dL (ref 3.5–5.0)
Alkaline Phosphatase: 101 U/L (ref 38–126)
Anion gap: 9 (ref 5–15)
BUN: 25 mg/dL — ABNORMAL HIGH (ref 8–23)
CO2: 21 mmol/L — ABNORMAL LOW (ref 22–32)
Calcium: 8.5 mg/dL — ABNORMAL LOW (ref 8.9–10.3)
Chloride: 112 mmol/L — ABNORMAL HIGH (ref 98–111)
Creatinine: 2.15 mg/dL — ABNORMAL HIGH (ref 0.61–1.24)
GFR, Estimated: 30 mL/min — ABNORMAL LOW (ref >60)
Glucose, Bld: 104 mg/dL — ABNORMAL HIGH (ref 70–99)
Potassium: 3.6 mmol/L (ref 3.5–5.1)
Sodium: 142 mmol/L (ref 135–145)
Total Bilirubin: 0.7 mg/dL (ref 0.3–1.2)
Total Protein: 6 g/dL — ABNORMAL LOW (ref 6.5–8.1)

## 2020-11-27 LAB — CBC WITH DIFFERENTIAL (CANCER CENTER ONLY)
Abs Immature Granulocytes: 0.01 10*3/uL (ref 0.00–0.07)
Basophils Absolute: 0 10*3/uL (ref 0.0–0.1)
Basophils Relative: 1 %
Eosinophils Absolute: 0 10*3/uL (ref 0.0–0.5)
Eosinophils Relative: 1 %
HCT: 31.1 % — ABNORMAL LOW (ref 39.0–52.0)
Hemoglobin: 10.1 g/dL — ABNORMAL LOW (ref 13.0–17.0)
Immature Granulocytes: 0 %
Lymphocytes Relative: 19 %
Lymphs Abs: 0.6 10*3/uL — ABNORMAL LOW (ref 0.7–4.0)
MCH: 30.1 pg (ref 26.0–34.0)
MCHC: 32.5 g/dL (ref 30.0–36.0)
MCV: 92.8 fL (ref 80.0–100.0)
Monocytes Absolute: 0.4 10*3/uL (ref 0.1–1.0)
Monocytes Relative: 12 %
Neutro Abs: 2 10*3/uL (ref 1.7–7.7)
Neutrophils Relative %: 67 %
Platelet Count: 153 10*3/uL (ref 150–400)
RBC: 3.35 MIL/uL — ABNORMAL LOW (ref 4.22–5.81)
RDW: 15.8 % — ABNORMAL HIGH (ref 11.5–15.5)
WBC Count: 3 10*3/uL — ABNORMAL LOW (ref 4.0–10.5)
nRBC: 0 % (ref 0.0–0.2)

## 2020-11-27 LAB — LACTATE DEHYDROGENASE: LDH: 246 U/L — ABNORMAL HIGH (ref 98–192)

## 2020-11-27 MED ORDER — SODIUM CHLORIDE 0.9 % IV SOLN
20.0000 mg | Freq: Once | INTRAVENOUS | Status: AC
  Administered 2020-11-27: 20 mg via INTRAVENOUS
  Filled 2020-11-27: qty 20

## 2020-11-27 MED ORDER — DEXTROSE 5 % IV SOLN
52.5000 mg/m2 | Freq: Once | INTRAVENOUS | Status: AC
  Administered 2020-11-27: 120 mg via INTRAVENOUS
  Filled 2020-11-27: qty 60

## 2020-11-27 MED ORDER — SODIUM CHLORIDE 0.9 % IV SOLN: Freq: Once | INTRAVENOUS | Status: AC

## 2020-11-27 MED ORDER — SODIUM CHLORIDE 0.9 % IV SOLN: Freq: Once | INTRAVENOUS | Status: DC

## 2020-11-27 MED ORDER — DARATUMUMAB-HYALURONIDASE-FIHJ 1800-30000 MG-UT/15ML ~~LOC~~ SOLN
1800.0000 mg | Freq: Once | SUBCUTANEOUS | Status: AC
  Administered 2020-11-27: 1800 mg via SUBCUTANEOUS
  Filled 2020-11-27: qty 15

## 2020-11-27 NOTE — Progress Notes (Signed)
Hematology and Oncology Follow Up Visit  [name] [name] [id number] Jun 24, 1940 80 y.o. 11/27/2020   Principle Diagnosis:  IgG kappa myeloma - Relapsed -- 1q duplication Acute thromboembolism of the right lower leg   Past Therapy:        S/P ASCT at [facility] on 04/02/2015 Patient s/p cycle 5 of Velcade/Revlimid/Decadron Revlimid 10mg  po q day (21/28) -- d/c on 09/15/2017   Current Therapy:     Faspro/Velcade/Decadron -- s/p cycle #6- start on 03/20/2020   -- d/c velcade and start Kyprolis on 09/27/2020 Zometa 4 mg IV every 2 months  - next dose due 10/2020 Xarelto 10 mg by mouth daily   Interim History:  [name] [name] is here today for follow-up.  It is hard to say if he is developing some kidney problems from the Kyprolis.  I noted that his renal function has been going up slowly.  He is being followed closely by his family doctor.  He wants to refer [name] [name] to nephrology.  I think were going to have to decrease the dose of the Kyprolis.  It is working quite nicely.  His Kappa light chain has been coming down quite well.  He also has these jerking movements.  This seems to be in his hands.  His calcium is a little bit on the low side.  Is not all that bad.  I told him to take some Tums at home to see if this might help.  Is appetite is doing okay.  He has had no nausea or vomiting..  A recent echocardiogram which showed his ejection fraction to be better at 50-55%.  He has had no fever.  He has had no cough.  He has had his back issues which have been chronic.  Currently, I would have to say that his performance status is probably ECOG 1.     Medications:  Allergies as of 11/27/2020       Reactions   Budesonide-formoterol Fumarate Itching, Other (See Comments)   Codeine Itching   Hydrocodone-acetaminophen Itching   Mushroom Extract Complex Nausea And Vomiting, Other (See Comments)   Only shitake mushroom  causes this reaction. Flu-like symptoms from portabello mushrooms         Medication List        Accurate as of November 27, 2020 11:03 AM. If you have any questions, ask your nurse or doctor.          benzonatate 200 MG capsule Commonly known as: TESSALON Take 1 capsule (200 mg total) by mouth 3 (three) times daily as needed for cough.   cetirizine 10 MG tablet Commonly known as: EQ Allergy Relief (Cetirizine) Take 1 tablet (10 mg total) by mouth daily.   clopidogrel 75 MG tablet Commonly known as: PLAVIX Take 75 mg by mouth daily.   Cyanocobalamin 1000 MCG Tbcr Take 1 tablet by mouth daily.   dexamethasone 4 MG tablet Commonly known as: DECADRON TAKE 5 TABLETS (20 MG TOTAL) BY MOUTH SEE ADMIN INSTRUCTIONS (TAKE 5 TABLETS ONCE ON THE DAY OF TREATMENT)   doxycycline 100 MG tablet Commonly known as: VIBRA-TABS Take 100 mg by mouth 2 (two) times daily.   empagliflozin 10 MG Tabs tablet Commonly known as: JARDIANCE Take by mouth.   esomeprazole 40 MG capsule Commonly known as: NEXIUM Take 1 capsule (40 mg total) by mouth daily at 12 noon.   famciclovir 500 MG tablet Commonly known as: FAMVIR Take 1 tablet (500 mg total) by mouth daily.   fluticasone 50  MCG/ACT nasal spray Commonly known as: FLONASE Place 2 sprays into both nostrils daily.   furosemide 20 MG tablet Commonly known as: LASIX Take 20 mg by mouth daily as needed.   gabapentin 600 MG tablet Commonly known as: NEURONTIN Take 1,800 mg by mouth daily.   hydrocortisone 2.5 % ointment Apply topically 2 (two) times daily.   isosorbide mononitrate 60 MG 24 hr tablet Commonly known as: IMDUR Take 60 mg by mouth in the morning and at bedtime.   levothyroxine 100 MCG tablet Commonly known as: Synthroid Take 1 tablet (100 mcg total) by mouth daily before breakfast.   LORazepam 1 MG tablet Commonly known as: ATIVAN Take 1 mg by mouth at bedtime.   LORazepam 1 MG tablet Commonly known as: ATIVAN Take 1 mg by mouth every 6 (six) hours as needed.   metoprolol  succinate 25 MG 24 hr tablet Commonly known as: TOPROL-XL Take 0.5 tablets (12.5 mg total) by mouth daily.   nitroGLYCERIN 0.4 MG SL tablet Commonly known as: NITROSTAT Place 1 tablet (0.4 mg total) under the tongue every 5 (five) minutes as needed. For chest pain   ondansetron 8 MG tablet Commonly known as: Zofran Take 1 tablet (8 mg total) by mouth 2 (two) times daily as needed (Nausea or vomiting).   oxyCODONE-acetaminophen 10-325 MG tablet Commonly known as: PERCOCET Take 1 tablet by mouth every 8 (eight) hours as needed for pain. for pain   potassium chloride SA 20 MEQ tablet Commonly known as: KLOR-CON TAKE 1 TABLET TWICE DAILY   prochlorperazine 10 MG tablet Commonly known as: COMPAZINE Take 1 tablet (10 mg total) by mouth every 6 (six) hours as needed (Nausea or vomiting).   pyridoxine 100 MG tablet Commonly known as: B-6 Take 100 mg by mouth daily.   Repatha SureClick 443 MG/ML Soaj Generic drug: Evolocumab Inject 140 mg into the skin every 14 (fourteen) days.   rivaroxaban 10 MG Tabs tablet Commonly known as: XARELTO Take 1 tablet (10 mg total) by mouth daily with supper.   sacubitril-valsartan 97-103 MG Commonly known as: ENTRESTO Take 1 tablet by mouth 2 (two) times daily.   tamsulosin 0.4 MG Caps capsule Commonly known as: FLOMAX Take 1 capsule (0.4 mg total) by mouth daily after supper.   Zoledronic Acid 4 MG Solr Inject into the vein.        Allergies:  Allergies  Allergen Reactions   Budesonide-Formoterol Fumarate Itching and Other (See Comments)   Codeine Itching   Hydrocodone-Acetaminophen Itching   Mushroom Extract Complex Nausea And Vomiting and Other (See Comments)    Only shitake mushroom  causes this reaction. Flu-like symptoms from portabello mushrooms    Past Medical History, Surgical history, Social history, and Family History were reviewed and updated.  Review of Systems: Review of Systems  HENT: Negative.    Eyes:  Negative.   Respiratory: Negative.    Cardiovascular:  Positive for chest pain.  Gastrointestinal: Negative.   Genitourinary: Negative.   Musculoskeletal:  Positive for back pain.  Skin: Negative.   Neurological: Negative.   Endo/Heme/Allergies: Negative.   Psychiatric/Behavioral: Negative.      Physical Exam:  weight is 210 lb (95.3 kg). His oral temperature is 97.9 F (36.6 C). His blood pressure is 121/53 (abnormal) and his pulse is 63. His respiration is 18 and oxygen saturation is 100%.   Wt Readings from Last 3 Encounters:  11/27/20 210 lb (95.3 kg)  11/06/20 213 lb 0.6 oz (96.6 kg)  10/30/20 213  lb (96.6 kg)    Physical Exam Vitals reviewed.  HENT:     Head: Normocephalic and atraumatic.  Eyes:     Pupils: Pupils are equal, round, and reactive to light.  Cardiovascular:     Rate and Rhythm: Normal rate and regular rhythm.     Heart sounds: Normal heart sounds.  Pulmonary:     Effort: Pulmonary effort is normal.     Breath sounds: Normal breath sounds.  Abdominal:     General: Bowel sounds are normal.     Palpations: Abdomen is soft.  Musculoskeletal:        General: No tenderness or deformity. Normal range of motion.     Cervical back: Normal range of motion.  Lymphadenopathy:     Cervical: No cervical adenopathy.  Skin:    General: Skin is warm and dry.     Findings: No erythema or rash.  Neurological:     Mental Status: He is alert and oriented to person, place, and time.  Psychiatric:        Behavior: Behavior normal.        Thought Content: Thought content normal.        Judgment: Judgment normal.     Lab Results  Component Value Date   WBC 3.0 (L) 11/27/2020   HGB 10.1 (L) 11/27/2020   HCT 31.1 (L) 11/27/2020   MCV 92.8 11/27/2020   PLT 153 11/27/2020   Lab Results  Component Value Date   FERRITIN 68 09/22/2020   IRON 81 09/22/2020   TIBC 293 09/22/2020   UIBC 211 09/22/2020   IRONPCTSAT 28 09/22/2020   Lab Results  Component Value  Date   RETICCTPCT 1.7 07/18/2019   RBC 3.35 (L) 11/27/2020   Lab Results  Component Value Date   KPAFRELGTCHN 67.0 (H) 10/20/2020   LAMBDASER 2.5 (L) 10/20/2020   KAPLAMBRATIO 26.80 (H) 10/20/2020   Lab Results  Component Value Date   IGGSERUM 496 (L) 10/20/2020   IGA 9 (L) 10/20/2020   IGMSERUM 13 (L) 10/20/2020   Lab Results  Component Value Date   TOTALPROTELP 5.7 (L) 10/20/2020   ALBUMINELP 3.4 10/20/2020   A1GS 0.2 10/20/2020   A2GS 0.8 10/20/2020   BETS 1.0 10/20/2020   BETA2SER 0.3 02/11/2015   GAMS 0.3 (L) 10/20/2020   MSPIKE 0.1 (H) 10/20/2020   SPEI Comment 09/22/2020     Chemistry      Component Value Date/Time   NA 142 11/27/2020 1005   NA 145 (H) 06/28/2019 1501   NA 140 01/27/2017 1044   NA 141 06/05/2015 1036   K 3.6 11/27/2020 1005   K 3.3 01/27/2017 1044   K 2.9 (LL) 06/05/2015 1036   CL 112 (H) 11/27/2020 1005   CL 101 01/27/2017 1044   CO2 21 (L) 11/27/2020 1005   CO2 28 01/27/2017 1044   CO2 27 06/05/2015 1036   BUN 25 (H) 11/27/2020 1005   BUN 15 06/28/2019 1501   BUN 14 01/27/2017 1044   BUN 13.1 06/05/2015 1036   CREATININE 2.15 (H) 11/27/2020 1005   CREATININE 2.49 (H) 11/25/2020 0000   CREATININE 1.0 06/05/2015 1036   GLU 152 03/30/2016 0000      Component Value Date/Time   CALCIUM 8.5 (L) 11/27/2020 1005   CALCIUM 8.9 01/27/2017 1044   CALCIUM 9.4 06/05/2015 1036   ALKPHOS 101 11/27/2020 1005   ALKPHOS 126 (H) 01/27/2017 1044   ALKPHOS 94 06/05/2015 1036   AST 17 11/27/2020 1005  AST 22 06/05/2015 1036   ALT 15 11/27/2020 1005   ALT 33 01/27/2017 1044   ALT 18 06/05/2015 1036   BILITOT 0.7 11/27/2020 1005   BILITOT 0.59 06/05/2015 1036       Impression and Plan: [name] [name] is a very pleasant 80yo caucasian gentleman with history of IgG kappa myeloma.  He underwent standard induction chemotherapy and then ultimately stem cell transplant at [facility] in February 2017.   I think we will add back in the Darzalex.  I just do  not think that this is really causing issues for him.  Again, we will cut back the Kyprolis dose a little bit.  We will see what his kappa light chain level is.  We will try to get him back in another month for his fourth cycle of treatment.    [name], MD 10/14/202211:03 AM

## 2020-11-27 NOTE — Progress Notes (Signed)
CBC and CMET reviewed by MD, ok to treat despite counts.

## 2020-11-27 NOTE — Patient Instructions (Signed)
[facility]  Discharge Instructions: Thank you for choosing [facility] to provide your oncology and hematology care.   If you have a lab appointment with the [facility], please go directly to the [facility] and check in at the registration area.  Wear comfortable clothing and clothing appropriate for easy access to any Portacath or PICC line.   We strive to give you quality time with your provider. You may need to reschedule your appointment if you arrive late (15 or more minutes).  Arriving late affects you and other patients whose appointments are after yours.  Also, if you miss three or more appointments without notifying the office, you may be dismissed from the clinic at the provider's discretion.      For prescription refill requests, have your pharmacy contact our office and allow 72 hours for refills to be completed.    Today you received the following chemotherapy and/or immunotherapy agents  Kyprolis and Faspro    To help prevent nausea and vomiting after your treatment, we encourage you to take your nausea medication as directed.  BELOW ARE SYMPTOMS THAT SHOULD BE REPORTED IMMEDIATELY: *FEVER GREATER THAN 100.4 F (38 C) OR HIGHER *CHILLS OR SWEATING *NAUSEA AND VOMITING THAT IS NOT CONTROLLED WITH YOUR NAUSEA MEDICATION *UNUSUAL SHORTNESS OF BREATH *UNUSUAL BRUISING OR BLEEDING *URINARY PROBLEMS (pain or burning when urinating, or frequent urination) *BOWEL PROBLEMS (unusual diarrhea, constipation, pain near the anus) TENDERNESS IN MOUTH AND THROAT WITH OR WITHOUT PRESENCE OF ULCERS (sore throat, sores in mouth, or a toothache) UNUSUAL RASH, SWELLING OR PAIN  UNUSUAL VAGINAL DISCHARGE OR ITCHING   Items with * indicate a potential emergency and should be followed up as soon as possible or go to the Emergency Department if any problems should occur.  Please show the CHEMOTHERAPY ALERT CARD or IMMUNOTHERAPY ALERT CARD at  check-in to the Emergency Department and triage nurse. Should you have questions after your visit or need to cancel or reschedule your appointment, please contact [facility]  [phone] and follow the prompts.  Office hours are 8:00 a.m. to 4:30 p.m. Monday - Friday. Please note that voicemails left after 4:00 p.m. may not be returned until the following business day.  We are closed weekends and major holidays. You have access to a nurse at all times for urgent questions. Please call the main number to the clinic [phone] and follow the prompts.  For any non-urgent questions, you may also contact your provider using MyChart. We now offer e-Visits for anyone 82 and older to request care online for non-urgent symptoms. For details visit [email or web address].   Also download the MyChart app! Go to the app store, search "MyChart", open the app, select [facility], and log in with your MyChart username and password.  Due to Covid, a mask is required upon entering the hospital/clinic. If you do not have a mask, one will be given to you upon arrival. For doctor visits, patients may have 1 support person aged 40 or older with them. For treatment visits, patients cannot have anyone with them due to current Covid guidelines and our immunocompromised population.

## 2020-11-27 NOTE — Telephone Encounter (Signed)
Medication: Evolocumab (REPATHA SURECLICK) 825 MG/ML SOAJ Prior authorization submitted via CoverMyMeds on 11/27/2020 PA submission pending

## 2020-11-28 LAB — IGG, IGA, IGM
IgA: 7 mg/dL — ABNORMAL LOW (ref 61–437)
IgG (Immunoglobin G), Serum: 375 mg/dL — ABNORMAL LOW (ref 603–1613)
IgM (Immunoglobulin M), Srm: 10 mg/dL — ABNORMAL LOW (ref 15–143)

## 2020-11-30 ENCOUNTER — Other Ambulatory Visit: Payer: Self-pay

## 2020-11-30 DIAGNOSIS — J069 Acute upper respiratory infection, unspecified: Secondary | ICD-10-CM

## 2020-11-30 LAB — KAPPA/LAMBDA LIGHT CHAINS
Kappa free light chain: 33.4 mg/L — ABNORMAL HIGH (ref 3.3–19.4)
Kappa, lambda light chain ratio: 7.59 — ABNORMAL HIGH (ref 0.26–1.65)
Lambda free light chains: 4.4 mg/L — ABNORMAL LOW (ref 5.7–26.3)

## 2020-11-30 LAB — PROTEIN ELECTROPHORESIS, SERUM, WITH REFLEX
A/G Ratio: 1.7 (ref 0.7–1.7)
Albumin ELP: 3.7 g/dL (ref 2.9–4.4)
Alpha-1-Globulin: 0.3 g/dL (ref 0.0–0.4)
Alpha-2-Globulin: 0.8 g/dL (ref 0.4–1.0)
Beta Globulin: 0.9 g/dL (ref 0.7–1.3)
Gamma Globulin: 0.3 g/dL — ABNORMAL LOW (ref 0.4–1.8)
Globulin, Total: 2.2 g/dL (ref 2.2–3.9)
Total Protein ELP: 5.9 g/dL — ABNORMAL LOW (ref 6.0–8.5)

## 2020-11-30 MED ORDER — BENZONATATE 200 MG PO CAPS: 200.0000 mg | ORAL_CAPSULE | Freq: Three times a day (TID) | ORAL | 0 refills | Status: DC | PRN

## 2020-11-30 NOTE — Telephone Encounter (Signed)
Medication: Evolocumab (REPATHA SURECLICK) 987 MG/ML SOAJ Prior authorization determination received Medication has been approved Approval dates: 11/27/2020-05/26/2021  Patient aware via: [place] aware: Yes Provider aware via this encounter

## 2020-12-01 DIAGNOSIS — J121 Respiratory syncytial virus pneumonia: Secondary | ICD-10-CM | POA: Diagnosis present

## 2020-12-01 DIAGNOSIS — E1140 Type 2 diabetes mellitus with diabetic neuropathy, unspecified: Secondary | ICD-10-CM | POA: Diagnosis present

## 2020-12-01 DIAGNOSIS — N400 Enlarged prostate without lower urinary tract symptoms: Secondary | ICD-10-CM | POA: Diagnosis present

## 2020-12-01 DIAGNOSIS — Z91018 Allergy to other foods: Secondary | ICD-10-CM | POA: Diagnosis not present

## 2020-12-01 DIAGNOSIS — R5383 Other fatigue: Secondary | ICD-10-CM | POA: Diagnosis not present

## 2020-12-01 DIAGNOSIS — Z7902 Long term (current) use of antithrombotics/antiplatelets: Secondary | ICD-10-CM | POA: Diagnosis not present

## 2020-12-01 DIAGNOSIS — Z66 Do not resuscitate: Secondary | ICD-10-CM | POA: Diagnosis present

## 2020-12-01 DIAGNOSIS — R0602 Shortness of breath: Secondary | ICD-10-CM | POA: Diagnosis not present

## 2020-12-01 DIAGNOSIS — J439 Emphysema, unspecified: Secondary | ICD-10-CM | POA: Diagnosis not present

## 2020-12-01 DIAGNOSIS — J168 Pneumonia due to other specified infectious organisms: Secondary | ICD-10-CM | POA: Diagnosis not present

## 2020-12-01 DIAGNOSIS — Z79890 Hormone replacement therapy (postmenopausal): Secondary | ICD-10-CM | POA: Diagnosis not present

## 2020-12-01 DIAGNOSIS — J449 Chronic obstructive pulmonary disease, unspecified: Secondary | ICD-10-CM | POA: Diagnosis not present

## 2020-12-01 DIAGNOSIS — D696 Thrombocytopenia, unspecified: Secondary | ICD-10-CM | POA: Diagnosis present

## 2020-12-01 DIAGNOSIS — Z885 Allergy status to narcotic agent status: Secondary | ICD-10-CM | POA: Diagnosis not present

## 2020-12-01 DIAGNOSIS — Z20822 Contact with and (suspected) exposure to covid-19: Secondary | ICD-10-CM | POA: Diagnosis not present

## 2020-12-01 DIAGNOSIS — R110 Nausea: Secondary | ICD-10-CM | POA: Diagnosis not present

## 2020-12-01 DIAGNOSIS — G4733 Obstructive sleep apnea (adult) (pediatric): Secondary | ICD-10-CM | POA: Diagnosis not present

## 2020-12-01 DIAGNOSIS — Z79899 Other long term (current) drug therapy: Secondary | ICD-10-CM | POA: Diagnosis not present

## 2020-12-01 DIAGNOSIS — R059 Cough, unspecified: Secondary | ICD-10-CM | POA: Diagnosis not present

## 2020-12-01 DIAGNOSIS — Z6828 Body mass index (BMI) 28.0-28.9, adult: Secondary | ICD-10-CM | POA: Diagnosis not present

## 2020-12-01 DIAGNOSIS — E119 Type 2 diabetes mellitus without complications: Secondary | ICD-10-CM | POA: Diagnosis not present

## 2020-12-01 DIAGNOSIS — Z7901 Long term (current) use of anticoagulants: Secondary | ICD-10-CM | POA: Diagnosis not present

## 2020-12-01 DIAGNOSIS — I2510 Atherosclerotic heart disease of native coronary artery without angina pectoris: Secondary | ICD-10-CM | POA: Diagnosis not present

## 2020-12-01 DIAGNOSIS — R918 Other nonspecific abnormal finding of lung field: Secondary | ICD-10-CM | POA: Diagnosis not present

## 2020-12-01 DIAGNOSIS — N281 Cyst of kidney, acquired: Secondary | ICD-10-CM | POA: Diagnosis not present

## 2020-12-01 DIAGNOSIS — E663 Overweight: Secondary | ICD-10-CM | POA: Diagnosis present

## 2020-12-01 DIAGNOSIS — C9000 Multiple myeloma not having achieved remission: Secondary | ICD-10-CM | POA: Diagnosis not present

## 2020-12-01 DIAGNOSIS — R0789 Other chest pain: Secondary | ICD-10-CM | POA: Diagnosis not present

## 2020-12-01 DIAGNOSIS — Z888 Allergy status to other drugs, medicaments and biological substances status: Secondary | ICD-10-CM | POA: Diagnosis not present

## 2020-12-01 DIAGNOSIS — R531 Weakness: Secondary | ICD-10-CM | POA: Diagnosis not present

## 2020-12-01 DIAGNOSIS — R0981 Nasal congestion: Secondary | ICD-10-CM | POA: Diagnosis not present

## 2020-12-01 DIAGNOSIS — Z87891 Personal history of nicotine dependence: Secondary | ICD-10-CM | POA: Diagnosis not present

## 2020-12-01 DIAGNOSIS — N3289 Other specified disorders of bladder: Secondary | ICD-10-CM | POA: Diagnosis not present

## 2020-12-01 DIAGNOSIS — I10 Essential (primary) hypertension: Secondary | ICD-10-CM | POA: Diagnosis not present

## 2020-12-01 DIAGNOSIS — Z79891 Long term (current) use of opiate analgesic: Secondary | ICD-10-CM | POA: Diagnosis not present

## 2020-12-01 DIAGNOSIS — D630 Anemia in neoplastic disease: Secondary | ICD-10-CM | POA: Diagnosis present

## 2020-12-01 DIAGNOSIS — E039 Hypothyroidism, unspecified: Secondary | ICD-10-CM | POA: Diagnosis present

## 2020-12-01 DIAGNOSIS — N179 Acute kidney failure, unspecified: Secondary | ICD-10-CM | POA: Diagnosis not present

## 2020-12-01 DIAGNOSIS — K219 Gastro-esophageal reflux disease without esophagitis: Secondary | ICD-10-CM | POA: Diagnosis present

## 2020-12-04 ENCOUNTER — Telehealth: Payer: Self-pay

## 2020-12-04 ENCOUNTER — Inpatient Hospital Stay: Payer: Medicare Other

## 2020-12-04 DIAGNOSIS — D649 Anemia, unspecified: Secondary | ICD-10-CM | POA: Diagnosis not present

## 2020-12-04 DIAGNOSIS — Z9181 History of falling: Secondary | ICD-10-CM | POA: Diagnosis not present

## 2020-12-04 DIAGNOSIS — I2510 Atherosclerotic heart disease of native coronary artery without angina pectoris: Secondary | ICD-10-CM | POA: Diagnosis not present

## 2020-12-04 DIAGNOSIS — G4733 Obstructive sleep apnea (adult) (pediatric): Secondary | ICD-10-CM | POA: Diagnosis not present

## 2020-12-04 DIAGNOSIS — D696 Thrombocytopenia, unspecified: Secondary | ICD-10-CM | POA: Diagnosis not present

## 2020-12-04 DIAGNOSIS — I10 Essential (primary) hypertension: Secondary | ICD-10-CM | POA: Diagnosis not present

## 2020-12-04 DIAGNOSIS — E039 Hypothyroidism, unspecified: Secondary | ICD-10-CM | POA: Diagnosis not present

## 2020-12-04 DIAGNOSIS — Z6827 Body mass index (BMI) 27.0-27.9, adult: Secondary | ICD-10-CM | POA: Diagnosis not present

## 2020-12-04 DIAGNOSIS — N400 Enlarged prostate without lower urinary tract symptoms: Secondary | ICD-10-CM | POA: Diagnosis not present

## 2020-12-04 DIAGNOSIS — Z7901 Long term (current) use of anticoagulants: Secondary | ICD-10-CM | POA: Diagnosis not present

## 2020-12-04 DIAGNOSIS — K219 Gastro-esophageal reflux disease without esophagitis: Secondary | ICD-10-CM | POA: Diagnosis not present

## 2020-12-04 DIAGNOSIS — E1136 Type 2 diabetes mellitus with diabetic cataract: Secondary | ICD-10-CM | POA: Diagnosis not present

## 2020-12-04 DIAGNOSIS — E663 Overweight: Secondary | ICD-10-CM | POA: Diagnosis not present

## 2020-12-04 DIAGNOSIS — E785 Hyperlipidemia, unspecified: Secondary | ICD-10-CM | POA: Diagnosis not present

## 2020-12-04 DIAGNOSIS — Z7952 Long term (current) use of systemic steroids: Secondary | ICD-10-CM | POA: Diagnosis not present

## 2020-12-04 DIAGNOSIS — J121 Respiratory syncytial virus pneumonia: Secondary | ICD-10-CM | POA: Diagnosis not present

## 2020-12-04 DIAGNOSIS — Z79891 Long term (current) use of opiate analgesic: Secondary | ICD-10-CM | POA: Diagnosis not present

## 2020-12-04 DIAGNOSIS — J439 Emphysema, unspecified: Secondary | ICD-10-CM | POA: Diagnosis not present

## 2020-12-04 DIAGNOSIS — Z7902 Long term (current) use of antithrombotics/antiplatelets: Secondary | ICD-10-CM | POA: Diagnosis not present

## 2020-12-04 DIAGNOSIS — R269 Unspecified abnormalities of gait and mobility: Secondary | ICD-10-CM | POA: Diagnosis not present

## 2020-12-04 DIAGNOSIS — Z87891 Personal history of nicotine dependence: Secondary | ICD-10-CM | POA: Diagnosis not present

## 2020-12-04 DIAGNOSIS — Z955 Presence of coronary angioplasty implant and graft: Secondary | ICD-10-CM | POA: Diagnosis not present

## 2020-12-04 DIAGNOSIS — E1140 Type 2 diabetes mellitus with diabetic neuropathy, unspecified: Secondary | ICD-10-CM | POA: Diagnosis not present

## 2020-12-04 DIAGNOSIS — C9000 Multiple myeloma not having achieved remission: Secondary | ICD-10-CM | POA: Diagnosis not present

## 2020-12-04 NOTE — Telephone Encounter (Signed)
I think it would be fine to continue as long as they both wear masks.

## 2020-12-04 NOTE — Telephone Encounter (Signed)
Lawson Radar is a Marine scientist at [place] you called to report that patient has been diagnosed with RSV ad will be homebound for several weeks. She wants to see patient twice weekly for 2 weeks and then once weekly for 2 weeks. Also, patient had been doing cardiac rehab so she wants to add PT so he doesn't lose any progress that he has made. Please advise.

## 2020-12-04 NOTE — Telephone Encounter (Signed)
Spoke with Brit and relayed Dr. Darene Lamer specific instructions about masks.

## 2020-12-05 ENCOUNTER — Other Ambulatory Visit: Payer: Self-pay | Admitting: Sports Medicine

## 2020-12-07 DIAGNOSIS — N179 Acute kidney failure, unspecified: Secondary | ICD-10-CM | POA: Diagnosis not present

## 2020-12-08 ENCOUNTER — Telehealth: Payer: Self-pay | Admitting: *Deleted

## 2020-12-08 ENCOUNTER — Telehealth: Payer: Self-pay

## 2020-12-08 DIAGNOSIS — J439 Emphysema, unspecified: Secondary | ICD-10-CM | POA: Diagnosis not present

## 2020-12-08 DIAGNOSIS — E1140 Type 2 diabetes mellitus with diabetic neuropathy, unspecified: Secondary | ICD-10-CM | POA: Diagnosis not present

## 2020-12-08 DIAGNOSIS — J121 Respiratory syncytial virus pneumonia: Secondary | ICD-10-CM | POA: Diagnosis not present

## 2020-12-08 DIAGNOSIS — C9000 Multiple myeloma not having achieved remission: Secondary | ICD-10-CM | POA: Diagnosis not present

## 2020-12-08 DIAGNOSIS — I2510 Atherosclerotic heart disease of native coronary artery without angina pectoris: Secondary | ICD-10-CM | POA: Diagnosis not present

## 2020-12-08 DIAGNOSIS — I10 Essential (primary) hypertension: Secondary | ICD-10-CM | POA: Diagnosis not present

## 2020-12-08 NOTE — Telephone Encounter (Signed)
Patient called stating he was still recovering from RSV and cancelled appts for this week but wanted to know if he needed to reschedule or just follow up with his next appointments/tx cycle/ called patient back and informed him since he was still not feeling well we wont reschedule and just wait until his next treatment cycle and to see Dr.Ennever. patient to call if he has any concerns interm. He is following up with pcp Thursday.

## 2020-12-08 NOTE — Telephone Encounter (Signed)
Patient called to cancel appointment for Friday. He is still weak from being in the hospital.

## 2020-12-09 ENCOUNTER — Telehealth: Payer: Self-pay

## 2020-12-09 ENCOUNTER — Other Ambulatory Visit: Payer: Medicare Other

## 2020-12-09 DIAGNOSIS — E1140 Type 2 diabetes mellitus with diabetic neuropathy, unspecified: Secondary | ICD-10-CM | POA: Diagnosis not present

## 2020-12-09 DIAGNOSIS — J439 Emphysema, unspecified: Secondary | ICD-10-CM | POA: Diagnosis not present

## 2020-12-09 DIAGNOSIS — I10 Essential (primary) hypertension: Secondary | ICD-10-CM | POA: Diagnosis not present

## 2020-12-09 DIAGNOSIS — J121 Respiratory syncytial virus pneumonia: Secondary | ICD-10-CM | POA: Diagnosis not present

## 2020-12-09 DIAGNOSIS — C9000 Multiple myeloma not having achieved remission: Secondary | ICD-10-CM | POA: Diagnosis not present

## 2020-12-09 DIAGNOSIS — I2510 Atherosclerotic heart disease of native coronary artery without angina pectoris: Secondary | ICD-10-CM | POA: Diagnosis not present

## 2020-12-09 NOTE — Telephone Encounter (Signed)
[facility] called to get VO for PT for patient. Patient has an appt with you tomorrow. Maybe you can discuss this with patient and assess his need then.

## 2020-12-10 ENCOUNTER — Other Ambulatory Visit: Payer: Self-pay

## 2020-12-10 ENCOUNTER — Ambulatory Visit (INDEPENDENT_AMBULATORY_CARE_PROVIDER_SITE_OTHER): Payer: Medicare Other

## 2020-12-10 ENCOUNTER — Other Ambulatory Visit: Payer: Self-pay | Admitting: Sports Medicine

## 2020-12-10 ENCOUNTER — Ambulatory Visit (INDEPENDENT_AMBULATORY_CARE_PROVIDER_SITE_OTHER): Payer: Medicare Other | Admitting: Sports Medicine

## 2020-12-10 VITALS — BP 110/65 | HR 74

## 2020-12-10 DIAGNOSIS — C44629 Squamous cell carcinoma of skin of left upper limb, including shoulder: Secondary | ICD-10-CM

## 2020-12-10 DIAGNOSIS — J121 Respiratory syncytial virus pneumonia: Secondary | ICD-10-CM | POA: Diagnosis not present

## 2020-12-10 DIAGNOSIS — I517 Cardiomegaly: Secondary | ICD-10-CM | POA: Diagnosis not present

## 2020-12-10 DIAGNOSIS — I2510 Atherosclerotic heart disease of native coronary artery without angina pectoris: Secondary | ICD-10-CM | POA: Diagnosis not present

## 2020-12-10 DIAGNOSIS — M47815 Spondylosis without myelopathy or radiculopathy, thoracolumbar region: Secondary | ICD-10-CM | POA: Diagnosis not present

## 2020-12-10 DIAGNOSIS — R069 Unspecified abnormalities of breathing: Secondary | ICD-10-CM | POA: Diagnosis not present

## 2020-12-10 DIAGNOSIS — Z9861 Coronary angioplasty status: Secondary | ICD-10-CM

## 2020-12-10 DIAGNOSIS — L989 Disorder of the skin and subcutaneous tissue, unspecified: Secondary | ICD-10-CM | POA: Diagnosis not present

## 2020-12-10 MED ORDER — AZITHROMYCIN 250 MG PO TABS: ORAL_TABLET | ORAL | 0 refills | Status: DC

## 2020-12-10 MED ORDER — PREDNISONE 10 MG (48) PO TBPK: ORAL_TABLET | Freq: Every day | ORAL | 0 refills | Status: DC

## 2020-12-10 MED ORDER — OXYCODONE-ACETAMINOPHEN 10-325 MG PO TABS: 1.0000 | ORAL_TABLET | Freq: Three times a day (TID) | ORAL | 0 refills | Status: DC | PRN

## 2020-12-10 NOTE — Assessment & Plan Note (Signed)
This pleasant 80 year old male returns, we were treating a lesion on the dorsum of his left hand at the prior visit, dome-shaped, initially suspected to be a folliculitis and treated with doxycycline, it did not resolve and my concern is for dermatofibroma and even a basal cell carcinoma, today I did a shave biopsy with aggressive hyfrecation. Return to see me as needed.

## 2020-12-10 NOTE — Progress Notes (Signed)
    Procedures performed today:    Procedure: Shave biopsy of 1.1 cm left dorsal hand skin lesion Risks, benefits, and alternatives explained and consent obtained. Time out conducted. Surface prepped with alcohol. 2cc lidocaine with epinephine infiltrated in a field block. Adequate anesthesia ensured. Area prepped and draped in a sterile fashion. Excision performed with: Using a derma blade I made a shave of the entire lesion into the deep dermis, I then used the Hyfrecator to achieve hemostasis. Pt stable.  Independent interpretation of notes and tests performed by another provider:   None.  Brief History, Exam, Impression, and Recommendations:    Skin lesion This pleasant 80 year old male returns, we were treating a lesion on the dorsum of his left hand at the prior visit, dome-shaped, initially suspected to be a folliculitis and treated with doxycycline, it did not resolve and my concern is for dermatofibroma and even a basal cell carcinoma, today I did a shave biopsy with aggressive hyfrecation. Return to see me as needed.  RSV (respiratory syncytial virus pneumonia) Recently hospitalized for RSV infection, shortness of breath, he is improved considerably after discharge but still has persistent cough, mild shortness of breath, significant fatigue, on exam he still has significant coarse sounds with prolonged expiratory phase. Adding a chest x-ray, prednisone taper, azithromycin considering his underlying chronic lung disease as well as history of multiple myeloma currently being treated with chemotherapy.    ___________________________________________ Gwen Her. Dianah Field, M.D., ABFM., CAQSM. Primary Care and [place] Instructor of [place] of [facility] of Medicine

## 2020-12-10 NOTE — Assessment & Plan Note (Signed)
Recently hospitalized for RSV infection, shortness of breath, he is improved considerably after discharge but still has persistent cough, mild shortness of breath, significant fatigue, on exam he still has significant coarse sounds with prolonged expiratory phase. Adding a chest x-ray, prednisone taper, azithromycin considering his underlying chronic lung disease as well as history of multiple myeloma currently being treated with chemotherapy.

## 2020-12-10 NOTE — Addendum Note (Signed)
Addended by: Gust Brooms on: 12/10/2020 12:07 PM   Modules accepted: Orders

## 2020-12-11 ENCOUNTER — Inpatient Hospital Stay: Payer: Medicare Other

## 2020-12-11 DIAGNOSIS — J121 Respiratory syncytial virus pneumonia: Secondary | ICD-10-CM | POA: Diagnosis not present

## 2020-12-11 DIAGNOSIS — E1140 Type 2 diabetes mellitus with diabetic neuropathy, unspecified: Secondary | ICD-10-CM | POA: Diagnosis not present

## 2020-12-11 DIAGNOSIS — I2510 Atherosclerotic heart disease of native coronary artery without angina pectoris: Secondary | ICD-10-CM | POA: Diagnosis not present

## 2020-12-11 DIAGNOSIS — I10 Essential (primary) hypertension: Secondary | ICD-10-CM | POA: Diagnosis not present

## 2020-12-11 DIAGNOSIS — C9000 Multiple myeloma not having achieved remission: Secondary | ICD-10-CM | POA: Diagnosis not present

## 2020-12-11 DIAGNOSIS — J439 Emphysema, unspecified: Secondary | ICD-10-CM | POA: Diagnosis not present

## 2020-12-15 DIAGNOSIS — I10 Essential (primary) hypertension: Secondary | ICD-10-CM | POA: Diagnosis not present

## 2020-12-15 DIAGNOSIS — I2510 Atherosclerotic heart disease of native coronary artery without angina pectoris: Secondary | ICD-10-CM | POA: Diagnosis not present

## 2020-12-15 DIAGNOSIS — C9000 Multiple myeloma not having achieved remission: Secondary | ICD-10-CM | POA: Diagnosis not present

## 2020-12-15 DIAGNOSIS — J439 Emphysema, unspecified: Secondary | ICD-10-CM | POA: Diagnosis not present

## 2020-12-15 DIAGNOSIS — J121 Respiratory syncytial virus pneumonia: Secondary | ICD-10-CM | POA: Diagnosis not present

## 2020-12-15 DIAGNOSIS — E1140 Type 2 diabetes mellitus with diabetic neuropathy, unspecified: Secondary | ICD-10-CM | POA: Diagnosis not present

## 2020-12-18 DIAGNOSIS — J439 Emphysema, unspecified: Secondary | ICD-10-CM | POA: Diagnosis not present

## 2020-12-18 DIAGNOSIS — I10 Essential (primary) hypertension: Secondary | ICD-10-CM | POA: Diagnosis not present

## 2020-12-18 DIAGNOSIS — J121 Respiratory syncytial virus pneumonia: Secondary | ICD-10-CM | POA: Diagnosis not present

## 2020-12-18 DIAGNOSIS — I2510 Atherosclerotic heart disease of native coronary artery without angina pectoris: Secondary | ICD-10-CM | POA: Diagnosis not present

## 2020-12-18 DIAGNOSIS — C9000 Multiple myeloma not having achieved remission: Secondary | ICD-10-CM | POA: Diagnosis not present

## 2020-12-18 DIAGNOSIS — E1140 Type 2 diabetes mellitus with diabetic neuropathy, unspecified: Secondary | ICD-10-CM | POA: Diagnosis not present

## 2020-12-21 DIAGNOSIS — C9001 Multiple myeloma in remission: Secondary | ICD-10-CM | POA: Diagnosis not present

## 2020-12-21 DIAGNOSIS — I2510 Atherosclerotic heart disease of native coronary artery without angina pectoris: Secondary | ICD-10-CM | POA: Diagnosis not present

## 2020-12-21 DIAGNOSIS — I10 Essential (primary) hypertension: Secondary | ICD-10-CM | POA: Diagnosis not present

## 2020-12-21 DIAGNOSIS — Z955 Presence of coronary angioplasty implant and graft: Secondary | ICD-10-CM | POA: Diagnosis not present

## 2020-12-21 DIAGNOSIS — I255 Ischemic cardiomyopathy: Secondary | ICD-10-CM | POA: Diagnosis not present

## 2020-12-21 NOTE — Telephone Encounter (Signed)
Christy with [facility] called again today to get VO for PT for patient. Please advise.

## 2020-12-22 ENCOUNTER — Telehealth: Payer: Self-pay

## 2020-12-22 NOTE — Telephone Encounter (Signed)
Called [facility] and gave verbal -ok for PT .

## 2020-12-22 NOTE — Telephone Encounter (Signed)
I am always okay for PT so verbal orders can always be given for that.

## 2020-12-22 NOTE — Telephone Encounter (Signed)
Left msg for Melissa that it is OK to D/C home health.

## 2020-12-22 NOTE — Telephone Encounter (Signed)
[name] [name] with [facility] called stating that patient wants to be D/C'd from home health services so he can return to cardiac rehab. Please advise.

## 2020-12-22 NOTE — Telephone Encounter (Signed)
This is also okay with me.

## 2020-12-23 ENCOUNTER — Other Ambulatory Visit: Payer: Self-pay

## 2020-12-23 ENCOUNTER — Ambulatory Visit (INDEPENDENT_AMBULATORY_CARE_PROVIDER_SITE_OTHER): Payer: Medicare Other | Admitting: Sports Medicine

## 2020-12-23 DIAGNOSIS — I2510 Atherosclerotic heart disease of native coronary artery without angina pectoris: Secondary | ICD-10-CM

## 2020-12-23 DIAGNOSIS — Z9861 Coronary angioplasty status: Secondary | ICD-10-CM

## 2020-12-23 DIAGNOSIS — J121 Respiratory syncytial virus pneumonia: Secondary | ICD-10-CM

## 2020-12-23 DIAGNOSIS — C44629 Squamous cell carcinoma of skin of left upper limb, including shoulder: Secondary | ICD-10-CM | POA: Diagnosis not present

## 2020-12-23 NOTE — Assessment & Plan Note (Signed)
Hospitalized for RSV, shortness of breath, we did some prednisone and azithromycin considering his underlying chronic lung disease and multiple myeloma currently being treated with chemotherapy, he feels as though this has cured his symptoms, return as needed.

## 2020-12-23 NOTE — Assessment & Plan Note (Signed)
This pleasant 80 year old male returns, we did a shave biopsy of a left hand skin lesion, turned out to be a well-differentiated squamous cell carcinoma, I did aggressively hyfrecate the base and I think this is cured, we will keep an eye out for local recurrence.

## 2020-12-23 NOTE — Progress Notes (Signed)
    Procedures performed today:    None.  Independent interpretation of notes and tests performed by another provider:   None.  Brief History, Exam, Impression, and Recommendations:    Squamous cell carcinoma of left hand This pleasant 80 year old male returns, we did a shave biopsy of a left hand skin lesion, turned out to be a well-differentiated squamous cell carcinoma, I did aggressively hyfrecate the base and I think this is cured, we will keep an eye out for local recurrence.  RSV (respiratory syncytial virus pneumonia) Hospitalized for RSV, shortness of breath, we did some prednisone and azithromycin considering his underlying chronic lung disease and multiple myeloma currently being treated with chemotherapy, he feels as though this has cured his symptoms, return as needed.    ___________________________________________ Gwen Her. Dianah Field, M.D., ABFM., CAQSM. Primary Care and [place] Instructor of [place] of [facility] of Medicine

## 2020-12-24 DIAGNOSIS — J121 Respiratory syncytial virus pneumonia: Secondary | ICD-10-CM | POA: Diagnosis not present

## 2020-12-24 DIAGNOSIS — E1140 Type 2 diabetes mellitus with diabetic neuropathy, unspecified: Secondary | ICD-10-CM | POA: Diagnosis not present

## 2020-12-24 DIAGNOSIS — I2510 Atherosclerotic heart disease of native coronary artery without angina pectoris: Secondary | ICD-10-CM | POA: Diagnosis not present

## 2020-12-24 DIAGNOSIS — N401 Enlarged prostate with lower urinary tract symptoms: Secondary | ICD-10-CM | POA: Diagnosis not present

## 2020-12-24 DIAGNOSIS — C9000 Multiple myeloma not having achieved remission: Secondary | ICD-10-CM | POA: Diagnosis not present

## 2020-12-24 DIAGNOSIS — I10 Essential (primary) hypertension: Secondary | ICD-10-CM | POA: Diagnosis not present

## 2020-12-24 DIAGNOSIS — J439 Emphysema, unspecified: Secondary | ICD-10-CM | POA: Diagnosis not present

## 2020-12-24 DIAGNOSIS — N179 Acute kidney failure, unspecified: Secondary | ICD-10-CM | POA: Diagnosis not present

## 2020-12-24 DIAGNOSIS — N138 Other obstructive and reflux uropathy: Secondary | ICD-10-CM | POA: Diagnosis not present

## 2020-12-25 ENCOUNTER — Inpatient Hospital Stay: Payer: Medicare Other | Attending: Hematology & Oncology

## 2020-12-25 ENCOUNTER — Inpatient Hospital Stay (HOSPITAL_BASED_OUTPATIENT_CLINIC_OR_DEPARTMENT_OTHER): Payer: Medicare Other | Admitting: Hematology & Oncology

## 2020-12-25 ENCOUNTER — Inpatient Hospital Stay: Payer: Medicare Other

## 2020-12-25 ENCOUNTER — Encounter: Payer: Self-pay | Admitting: Hematology & Oncology

## 2020-12-25 ENCOUNTER — Other Ambulatory Visit: Payer: Self-pay

## 2020-12-25 VITALS — BP 100/49 | HR 69 | Temp 98.2°F | Resp 19 | Wt 211.0 lb

## 2020-12-25 DIAGNOSIS — C9002 Multiple myeloma in relapse: Secondary | ICD-10-CM | POA: Diagnosis not present

## 2020-12-25 DIAGNOSIS — C9000 Multiple myeloma not having achieved remission: Secondary | ICD-10-CM

## 2020-12-25 DIAGNOSIS — Z5112 Encounter for antineoplastic immunotherapy: Secondary | ICD-10-CM | POA: Insufficient documentation

## 2020-12-25 LAB — CBC WITH DIFFERENTIAL (CANCER CENTER ONLY)
Abs Immature Granulocytes: 0.02 10*3/uL (ref 0.00–0.07)
Basophils Absolute: 0 10*3/uL (ref 0.0–0.1)
Basophils Relative: 0 %
Eosinophils Absolute: 0 10*3/uL (ref 0.0–0.5)
Eosinophils Relative: 0 %
HCT: 31.9 % — ABNORMAL LOW (ref 39.0–52.0)
Hemoglobin: 10.1 g/dL — ABNORMAL LOW (ref 13.0–17.0)
Immature Granulocytes: 0 %
Lymphocytes Relative: 18 %
Lymphs Abs: 1 10*3/uL (ref 0.7–4.0)
MCH: 32.2 pg (ref 26.0–34.0)
MCHC: 31.7 g/dL (ref 30.0–36.0)
MCV: 101.6 fL — ABNORMAL HIGH (ref 80.0–100.0)
Monocytes Absolute: 0.6 10*3/uL (ref 0.1–1.0)
Monocytes Relative: 11 %
Neutro Abs: 3.8 10*3/uL (ref 1.7–7.7)
Neutrophils Relative %: 71 %
Platelet Count: 115 10*3/uL — ABNORMAL LOW (ref 150–400)
RBC: 3.14 MIL/uL — ABNORMAL LOW (ref 4.22–5.81)
RDW: 16.7 % — ABNORMAL HIGH (ref 11.5–15.5)
WBC Count: 5.5 10*3/uL (ref 4.0–10.5)
nRBC: 0 % (ref 0.0–0.2)

## 2020-12-25 LAB — CMP (CANCER CENTER ONLY)
ALT: 29 U/L (ref 0–44)
AST: 16 U/L (ref 15–41)
Albumin: 3.9 g/dL (ref 3.5–5.0)
Alkaline Phosphatase: 89 U/L (ref 38–126)
Anion gap: 9 (ref 5–15)
BUN: 25 mg/dL — ABNORMAL HIGH (ref 8–23)
CO2: 27 mmol/L (ref 22–32)
Calcium: 9.1 mg/dL (ref 8.9–10.3)
Chloride: 105 mmol/L (ref 98–111)
Creatinine: 1.3 mg/dL — ABNORMAL HIGH (ref 0.61–1.24)
GFR, Estimated: 56 mL/min — ABNORMAL LOW (ref >60)
Glucose, Bld: 113 mg/dL — ABNORMAL HIGH (ref 70–99)
Potassium: 3.7 mmol/L (ref 3.5–5.1)
Sodium: 141 mmol/L (ref 135–145)
Total Bilirubin: 0.8 mg/dL (ref 0.3–1.2)
Total Protein: 5.5 g/dL — ABNORMAL LOW (ref 6.5–8.1)

## 2020-12-25 LAB — LACTATE DEHYDROGENASE: LDH: 175 U/L (ref 98–192)

## 2020-12-25 MED ORDER — DEXTROSE 5 % IV SOLN
52.5000 mg/m2 | Freq: Once | INTRAVENOUS | Status: AC
  Administered 2020-12-25: 120 mg via INTRAVENOUS
  Filled 2020-12-25: qty 60

## 2020-12-25 MED ORDER — SODIUM CHLORIDE 0.9 % IV SOLN: Freq: Once | INTRAVENOUS | Status: AC

## 2020-12-25 MED ORDER — SODIUM CHLORIDE 0.9 % IV SOLN
20.0000 mg | Freq: Once | INTRAVENOUS | Status: AC
  Administered 2020-12-25: 20 mg via INTRAVENOUS
  Filled 2020-12-25: qty 20

## 2020-12-25 MED ORDER — DARATUMUMAB-HYALURONIDASE-FIHJ 1800-30000 MG-UT/15ML ~~LOC~~ SOLN
1800.0000 mg | Freq: Once | SUBCUTANEOUS | Status: AC
  Administered 2020-12-25: 1800 mg via SUBCUTANEOUS
  Filled 2020-12-25: qty 15

## 2020-12-25 MED ORDER — SODIUM CHLORIDE 0.9 % IV SOLN: Freq: Once | INTRAVENOUS | Status: DC

## 2020-12-25 NOTE — Patient Instructions (Signed)
[facility]  Discharge Instructions: Thank you for choosing [facility] to provide your oncology and hematology care.   If you have a lab appointment with the [facility], please go directly to the [facility] and check in at the registration area.  Wear comfortable clothing and clothing appropriate for easy access to any Portacath or PICC line.   We strive to give you quality time with your provider. You may need to reschedule your appointment if you arrive late (15 or more minutes).  Arriving late affects you and other patients whose appointments are after yours.  Also, if you miss three or more appointments without notifying the office, you may be dismissed from the clinic at the provider's discretion.      For prescription refill requests, have your pharmacy contact our office and allow 72 hours for refills to be completed.    Today you received the following chemotherapy and/or immunotherapy agents Darzalex, Kyprolis       To help prevent nausea and vomiting after your treatment, we encourage you to take your nausea medication as directed.  BELOW ARE SYMPTOMS THAT SHOULD BE REPORTED IMMEDIATELY: *FEVER GREATER THAN 100.4 F (38 C) OR HIGHER *CHILLS OR SWEATING *NAUSEA AND VOMITING THAT IS NOT CONTROLLED WITH YOUR NAUSEA MEDICATION *UNUSUAL SHORTNESS OF BREATH *UNUSUAL BRUISING OR BLEEDING *URINARY PROBLEMS (pain or burning when urinating, or frequent urination) *BOWEL PROBLEMS (unusual diarrhea, constipation, pain near the anus) TENDERNESS IN MOUTH AND THROAT WITH OR WITHOUT PRESENCE OF ULCERS (sore throat, sores in mouth, or a toothache) UNUSUAL RASH, SWELLING OR PAIN  UNUSUAL VAGINAL DISCHARGE OR ITCHING   Items with * indicate a potential emergency and should be followed up as soon as possible or go to the Emergency Department if any problems should occur.  Please show the CHEMOTHERAPY ALERT CARD or IMMUNOTHERAPY ALERT CARD at  check-in to the Emergency Department and triage nurse. Should you have questions after your visit or need to cancel or reschedule your appointment, please contact [facility]  [phone] and follow the prompts.  Office hours are 8:00 a.m. to 4:30 p.m. Monday - Friday. Please note that voicemails left after 4:00 p.m. may not be returned until the following business day.  We are closed weekends and major holidays. You have access to a nurse at all times for urgent questions. Please call the main number to the clinic [phone] and follow the prompts.  For any non-urgent questions, you may also contact your provider using MyChart. We now offer e-Visits for anyone 38 and older to request care online for non-urgent symptoms. For details visit [email or web address].   Also download the MyChart app! Go to the app store, search "MyChart", open the app, select [facility], and log in with your MyChart username and password.  Due to Covid, a mask is required upon entering the hospital/clinic. If you do not have a mask, one will be given to you upon arrival. For doctor visits, patients may have 1 support person aged 6 or older with them. For treatment visits, patients cannot have anyone with them due to current Covid guidelines and our immunocompromised population.

## 2020-12-25 NOTE — Progress Notes (Signed)
Hematology and Oncology Follow Up Visit  [name] [name] [id number] May 01, 1940 80 y.o. 12/25/2020   Principle Diagnosis:  IgG kappa myeloma - Relapsed -- 1q duplication Acute thromboembolism of the right lower leg   Past Therapy:        S/P ASCT at [facility] on 04/02/2015 Patient s/p cycle 5 of Velcade/Revlimid/Decadron Revlimid 10mg  po q day (21/28) -- d/c on 09/15/2017   Current Therapy:     Faspro/Velcade/Decadron -- s/p cycle #6- start on 03/20/2020   -- d/c velcade and start Kyprolis on 09/27/2020 Zometa 4 mg IV every 2 months  - next dose due 02/2021 Xarelto 10 mg by mouth daily   Interim History:  Mr. Massman is here today for follow-up.  The big issue is that he had RSV.  He has right after his birthday.  He was hospitalized for a couple of days.  He had a very thorough work-up.  He did an echocardiogram.  His ejection fraction was 55-60%.  As such, he has had no problems with the Kyprolis.  His last kappa light chain was down to 30 mg/L.  I am very happy about this.  He has had no issues with fever.  He has had no diarrhea.  He has had no rashes.  He has had his Entresto dose adjusted.  I think this is probably helping his kidneys.  He has had no problems with leg swelling.  He takes Lasix daily.  Is blood pressure is on the lower side.  He is on quite a few blood pressure medications.  Currently, I would have to say that his performance status is ECOG 1.  Problem  Medications:  Allergies as of 12/25/2020       Reactions   Budesonide-formoterol Fumarate Itching, Other (See Comments)   Codeine Itching   Hydrocodone-acetaminophen Itching   Mushroom Extract Complex Nausea And Vomiting, Other (See Comments)   Only shitake mushroom  causes this reaction. Flu-like symptoms from portabello mushrooms        Medication List        Accurate as of December 25, 2020  9:37 AM. If you have any questions, ask your nurse or doctor.          benzonatate 200 MG  capsule Commonly known as: TESSALON Take 1 capsule (200 mg total) by mouth 3 (three) times daily as needed for cough.   cetirizine 10 MG tablet Commonly known as: EQ Allergy Relief (Cetirizine) Take 1 tablet (10 mg total) by mouth daily.   clopidogrel 75 MG tablet Commonly known as: PLAVIX Take 75 mg by mouth daily.   Cyanocobalamin 1000 MCG Tbcr Take 1 tablet by mouth daily.   dexamethasone 4 MG tablet Commonly known as: DECADRON TAKE 5 TABLETS (20 MG TOTAL) BY MOUTH SEE ADMIN INSTRUCTIONS (TAKE 5 TABLETS ONCE ON THE DAY OF TREATMENT)   empagliflozin 10 MG Tabs tablet Commonly known as: JARDIANCE Take by mouth.   esomeprazole 40 MG capsule Commonly known as: NEXIUM Take 1 capsule (40 mg total) by mouth daily at 12 noon.   famciclovir 500 MG tablet Commonly known as: FAMVIR Take 1 tablet (500 mg total) by mouth daily.   fluticasone 50 MCG/ACT nasal spray Commonly known as: FLONASE Place 2 sprays into both nostrils daily.   furosemide 20 MG tablet Commonly known as: LASIX Take 20 mg by mouth daily as needed.   gabapentin 600 MG tablet Commonly known as: NEURONTIN Take 1,800 mg by mouth daily.   hydrocortisone 2.5 % ointment Apply  topically 2 (two) times daily.   isosorbide mononitrate 60 MG 24 hr tablet Commonly known as: IMDUR Take 60 mg by mouth in the morning and at bedtime.   levothyroxine 100 MCG tablet Commonly known as: Synthroid Take 1 tablet (100 mcg total) by mouth daily before breakfast.   LORazepam 1 MG tablet Commonly known as: ATIVAN Take 1 mg by mouth at bedtime.   LORazepam 1 MG tablet Commonly known as: ATIVAN Take 1 mg by mouth every 6 (six) hours as needed.   metoprolol succinate 25 MG 24 hr tablet Commonly known as: TOPROL-XL Take 0.5 tablets (12.5 mg total) by mouth daily.   nitroGLYCERIN 0.4 MG SL tablet Commonly known as: NITROSTAT PLACE 1 TABLET UNDER THE TONGUE EVERY 5 (FIVE) MINUTES AS NEEDED FOR CHEST PAIN AS DIRECTED    ondansetron 8 MG tablet Commonly known as: Zofran Take 1 tablet (8 mg total) by mouth 2 (two) times daily as needed (Nausea or vomiting).   oxyCODONE-acetaminophen 10-325 MG tablet Commonly known as: PERCOCET Take 1 tablet by mouth every 8 (eight) hours as needed for pain. for pain   potassium chloride SA 20 MEQ tablet Commonly known as: KLOR-CON TAKE 1 TABLET TWICE DAILY   predniSONE 10 MG (48) Tbpk tablet Commonly known as: STERAPRED UNI-PAK 48 TAB Take by mouth daily. 12-day taper pack, use as directed for taper   prochlorperazine 10 MG tablet Commonly known as: COMPAZINE Take 1 tablet (10 mg total) by mouth every 6 (six) hours as needed (Nausea or vomiting).   pyridoxine 100 MG tablet Commonly known as: B-6 Take 100 mg by mouth daily.   Repatha SureClick 132 MG/ML Soaj Generic drug: Evolocumab Inject 140 mg into the skin every 14 (fourteen) days.   rivaroxaban 10 MG Tabs tablet Commonly known as: XARELTO Take 1 tablet (10 mg total) by mouth daily with supper.   sacubitril-valsartan 97-103 MG Commonly known as: ENTRESTO Take 1 tablet by mouth 2 (two) times daily.   tamsulosin 0.4 MG Caps capsule Commonly known as: FLOMAX Take 1 capsule (0.4 mg total) by mouth daily after supper.   Zoledronic Acid 4 MG Solr Inject into the vein.        Allergies:  Allergies  Allergen Reactions   Budesonide-Formoterol Fumarate Itching and Other (See Comments)   Codeine Itching   Hydrocodone-Acetaminophen Itching   Mushroom Extract Complex Nausea And Vomiting and Other (See Comments)    Only shitake mushroom  causes this reaction. Flu-like symptoms from portabello mushrooms    Past Medical History, Surgical history, Social history, and Family History were reviewed and updated.  Review of Systems: Review of Systems  HENT: Negative.    Eyes: Negative.   Respiratory: Negative.    Cardiovascular:  Positive for chest pain.  Gastrointestinal: Negative.   Genitourinary:  Negative.   Musculoskeletal:  Positive for back pain.  Skin: Negative.   Neurological: Negative.   Endo/Heme/Allergies: Negative.   Psychiatric/Behavioral: Negative.      Physical Exam:  weight is 211 lb (95.7 kg). His oral temperature is 98.2 F (36.8 C). His blood pressure is 100/49 (abnormal) and his pulse is 69. His respiration is 19 and oxygen saturation is 99%.   Wt Readings from Last 3 Encounters:  12/25/20 211 lb (95.7 kg)  11/27/20 210 lb (95.3 kg)  11/06/20 213 lb 0.6 oz (96.6 kg)    Physical Exam Vitals reviewed.  HENT:     Head: Normocephalic and atraumatic.  Eyes:     Pupils: Pupils are  equal, round, and reactive to light.  Cardiovascular:     Rate and Rhythm: Normal rate and regular rhythm.     Heart sounds: Normal heart sounds.  Pulmonary:     Effort: Pulmonary effort is normal.     Breath sounds: Normal breath sounds.  Abdominal:     General: Bowel sounds are normal.     Palpations: Abdomen is soft.  Musculoskeletal:        General: No tenderness or deformity. Normal range of motion.     Cervical back: Normal range of motion.  Lymphadenopathy:     Cervical: No cervical adenopathy.  Skin:    General: Skin is warm and dry.     Findings: No erythema or rash.  Neurological:     Mental Status: He is alert and oriented to person, place, and time.  Psychiatric:        Behavior: Behavior normal.        Thought Content: Thought content normal.        Judgment: Judgment normal.     Lab Results  Component Value Date   WBC 5.5 12/25/2020   HGB 10.1 (L) 12/25/2020   HCT 31.9 (L) 12/25/2020   MCV 101.6 (H) 12/25/2020   PLT 115 (L) 12/25/2020   Lab Results  Component Value Date   FERRITIN 68 09/22/2020   IRON 81 09/22/2020   TIBC 293 09/22/2020   UIBC 211 09/22/2020   IRONPCTSAT 28 09/22/2020   Lab Results  Component Value Date   RETICCTPCT 1.7 07/18/2019   RBC 3.14 (L) 12/25/2020   Lab Results  Component Value Date   KPAFRELGTCHN 33.4 (H)  11/27/2020   LAMBDASER 4.4 (L) 11/27/2020   KAPLAMBRATIO 7.59 (H) 11/27/2020   Lab Results  Component Value Date   IGGSERUM 375 (L) 11/27/2020   IGA 7 (L) 11/27/2020   IGMSERUM 10 (L) 11/27/2020   Lab Results  Component Value Date   TOTALPROTELP 5.9 (L) 11/27/2020   ALBUMINELP 3.7 11/27/2020   A1GS 0.3 11/27/2020   A2GS 0.8 11/27/2020   BETS 0.9 11/27/2020   BETA2SER 0.3 02/11/2015   GAMS 0.3 (L) 11/27/2020   MSPIKE Not Observed 11/27/2020   SPEI Comment 09/22/2020     Chemistry      Component Value Date/Time   NA 141 12/25/2020 0843   NA 145 (H) 06/28/2019 1501   NA 140 01/27/2017 1044   NA 141 06/05/2015 1036   K 3.7 12/25/2020 0843   K 3.3 01/27/2017 1044   K 2.9 (LL) 06/05/2015 1036   CL 105 12/25/2020 0843   CL 101 01/27/2017 1044   CO2 27 12/25/2020 0843   CO2 28 01/27/2017 1044   CO2 27 06/05/2015 1036   BUN 25 (H) 12/25/2020 0843   BUN 15 06/28/2019 1501   BUN 14 01/27/2017 1044   BUN 13.1 06/05/2015 1036   CREATININE 1.30 (H) 12/25/2020 0843   CREATININE 2.49 (H) 11/25/2020 0000   CREATININE 1.0 06/05/2015 1036   GLU 152 03/30/2016 0000      Component Value Date/Time   CALCIUM 9.1 12/25/2020 0843   CALCIUM 8.9 01/27/2017 1044   CALCIUM 9.4 06/05/2015 1036   ALKPHOS 89 12/25/2020 0843   ALKPHOS 126 (H) 01/27/2017 1044   ALKPHOS 94 06/05/2015 1036   AST 16 12/25/2020 0843   AST 22 06/05/2015 1036   ALT 29 12/25/2020 0843   ALT 33 01/27/2017 1044   ALT 18 06/05/2015 1036   BILITOT 0.8 12/25/2020 0843   BILITOT  0.59 06/05/2015 1036       Impression and Plan: Mr. Kurtz is a very pleasant 80yo caucasian gentleman with history of IgG kappa myeloma.  He underwent standard induction chemotherapy and then ultimately stem cell transplant at [facility] in February 2017.   I am sorry that he had the RSV.  Believes he is responding to treatment which is nice to see.  We will continue him on his protocol.  He will start his fourth cycle now.  We will plan  to see him back in December for his fifth cycle.  We will see about getting a 24-hour urine on him the next time that we see him.   [name], MD 11/11/20229:37 AM

## 2020-12-26 LAB — IGG, IGA, IGM
IgA: 9 mg/dL — ABNORMAL LOW (ref 61–437)
IgG (Immunoglobin G), Serum: 402 mg/dL — ABNORMAL LOW (ref 603–1613)
IgM (Immunoglobulin M), Srm: 15 mg/dL (ref 15–143)

## 2020-12-28 DIAGNOSIS — I2510 Atherosclerotic heart disease of native coronary artery without angina pectoris: Secondary | ICD-10-CM | POA: Diagnosis not present

## 2020-12-28 DIAGNOSIS — Z48812 Encounter for surgical aftercare following surgery on the circulatory system: Secondary | ICD-10-CM | POA: Diagnosis not present

## 2020-12-28 LAB — PROTEIN ELECTROPHORESIS, SERUM, WITH REFLEX
A/G Ratio: 1.6 (ref 0.7–1.7)
Albumin ELP: 3.4 g/dL (ref 2.9–4.4)
Alpha-1-Globulin: 0.2 g/dL (ref 0.0–0.4)
Alpha-2-Globulin: 0.7 g/dL (ref 0.4–1.0)
Beta Globulin: 0.8 g/dL (ref 0.7–1.3)
Gamma Globulin: 0.3 g/dL — ABNORMAL LOW (ref 0.4–1.8)
Globulin, Total: 2.1 g/dL — ABNORMAL LOW (ref 2.2–3.9)
Total Protein ELP: 5.5 g/dL — ABNORMAL LOW (ref 6.0–8.5)

## 2020-12-28 LAB — KAPPA/LAMBDA LIGHT CHAINS
Kappa free light chain: 29 mg/L — ABNORMAL HIGH (ref 3.3–19.4)
Kappa, lambda light chain ratio: 10.74 — ABNORMAL HIGH (ref 0.26–1.65)
Lambda free light chains: 2.7 mg/L — ABNORMAL LOW (ref 5.7–26.3)

## 2020-12-29 ENCOUNTER — Telehealth: Payer: Self-pay | Admitting: Hematology & Oncology

## 2020-12-29 ENCOUNTER — Encounter: Payer: Self-pay | Admitting: *Deleted

## 2020-12-29 DIAGNOSIS — I2510 Atherosclerotic heart disease of native coronary artery without angina pectoris: Secondary | ICD-10-CM | POA: Diagnosis not present

## 2020-12-29 DIAGNOSIS — Z48812 Encounter for surgical aftercare following surgery on the circulatory system: Secondary | ICD-10-CM | POA: Diagnosis not present

## 2020-12-31 DIAGNOSIS — I2510 Atherosclerotic heart disease of native coronary artery without angina pectoris: Secondary | ICD-10-CM | POA: Diagnosis not present

## 2020-12-31 DIAGNOSIS — N401 Enlarged prostate with lower urinary tract symptoms: Secondary | ICD-10-CM | POA: Diagnosis not present

## 2020-12-31 DIAGNOSIS — N138 Other obstructive and reflux uropathy: Secondary | ICD-10-CM | POA: Diagnosis not present

## 2020-12-31 DIAGNOSIS — Z48812 Encounter for surgical aftercare following surgery on the circulatory system: Secondary | ICD-10-CM | POA: Diagnosis not present

## 2021-01-01 ENCOUNTER — Other Ambulatory Visit: Payer: Self-pay

## 2021-01-01 ENCOUNTER — Encounter: Payer: Self-pay | Admitting: Sports Medicine

## 2021-01-01 ENCOUNTER — Inpatient Hospital Stay: Payer: Medicare Other

## 2021-01-01 VITALS — BP 131/67 | HR 76 | Temp 97.9°F

## 2021-01-01 DIAGNOSIS — Z5112 Encounter for antineoplastic immunotherapy: Secondary | ICD-10-CM | POA: Diagnosis not present

## 2021-01-01 DIAGNOSIS — C9000 Multiple myeloma not having achieved remission: Secondary | ICD-10-CM

## 2021-01-01 DIAGNOSIS — C9002 Multiple myeloma in relapse: Secondary | ICD-10-CM | POA: Diagnosis not present

## 2021-01-01 LAB — CBC WITH DIFFERENTIAL (CANCER CENTER ONLY)
Abs Immature Granulocytes: 0.01 10*3/uL (ref 0.00–0.07)
Basophils Absolute: 0 10*3/uL (ref 0.0–0.1)
Basophils Relative: 0 %
Eosinophils Absolute: 0 10*3/uL (ref 0.0–0.5)
Eosinophils Relative: 0 %
HCT: 30.9 % — ABNORMAL LOW (ref 39.0–52.0)
Hemoglobin: 9.3 g/dL — ABNORMAL LOW (ref 13.0–17.0)
Immature Granulocytes: 0 %
Lymphocytes Relative: 20 %
Lymphs Abs: 0.7 10*3/uL (ref 0.7–4.0)
MCH: 31.4 pg (ref 26.0–34.0)
MCHC: 30.1 g/dL (ref 30.0–36.0)
MCV: 104.4 fL — ABNORMAL HIGH (ref 80.0–100.0)
Monocytes Absolute: 0.3 10*3/uL (ref 0.1–1.0)
Monocytes Relative: 9 %
Neutro Abs: 2.5 10*3/uL (ref 1.7–7.7)
Neutrophils Relative %: 71 %
Platelet Count: 66 10*3/uL — ABNORMAL LOW (ref 150–400)
RBC: 2.96 MIL/uL — ABNORMAL LOW (ref 4.22–5.81)
RDW: 16.5 % — ABNORMAL HIGH (ref 11.5–15.5)
WBC Count: 3.5 10*3/uL — ABNORMAL LOW (ref 4.0–10.5)
nRBC: 0 % (ref 0.0–0.2)

## 2021-01-01 LAB — CMP (CANCER CENTER ONLY)
ALT: 19 U/L (ref 0–44)
AST: 19 U/L (ref 15–41)
Albumin: 3.8 g/dL (ref 3.5–5.0)
Alkaline Phosphatase: 79 U/L (ref 38–126)
Anion gap: 9 (ref 5–15)
BUN: 17 mg/dL (ref 8–23)
CO2: 26 mmol/L (ref 22–32)
Calcium: 9.1 mg/dL (ref 8.9–10.3)
Chloride: 105 mmol/L (ref 98–111)
Creatinine: 1.35 mg/dL — ABNORMAL HIGH (ref 0.61–1.24)
GFR, Estimated: 53 mL/min — ABNORMAL LOW (ref >60)
Glucose, Bld: 101 mg/dL — ABNORMAL HIGH (ref 70–99)
Potassium: 4.1 mmol/L (ref 3.5–5.1)
Sodium: 140 mmol/L (ref 135–145)
Total Bilirubin: 1 mg/dL (ref 0.3–1.2)
Total Protein: 5.8 g/dL — ABNORMAL LOW (ref 6.5–8.1)

## 2021-01-01 MED ORDER — SODIUM CHLORIDE 0.9 % IV SOLN: Freq: Once | INTRAVENOUS | Status: AC

## 2021-01-01 MED ORDER — SODIUM CHLORIDE 0.9 % IV SOLN
20.0000 mg | Freq: Once | INTRAVENOUS | Status: AC
  Administered 2021-01-01: 20 mg via INTRAVENOUS
  Filled 2021-01-01: qty 20

## 2021-01-01 MED ORDER — DEXTROSE 5 % IV SOLN
52.5000 mg/m2 | Freq: Once | INTRAVENOUS | Status: AC
  Administered 2021-01-01: 120 mg via INTRAVENOUS
  Filled 2021-01-01: qty 60

## 2021-01-01 NOTE — Progress Notes (Signed)
Okay to treat today with pltc 66 per Dr. Marin Olp. Zometa to be given every 3 months. Orders changed per his instructions.

## 2021-01-01 NOTE — Patient Instructions (Signed)
[facility]  Discharge Instructions: Thank you for choosing [facility] to provide your oncology and hematology care.   If you have a lab appointment with the [facility], please go directly to the [facility] and check in at the registration area.  Wear comfortable clothing and clothing appropriate for easy access to any Portacath or PICC line.   We strive to give you quality time with your provider. You may need to reschedule your appointment if you arrive late (15 or more minutes).  Arriving late affects you and other patients whose appointments are after yours.  Also, if you miss three or more appointments without notifying the office, you may be dismissed from the clinic at the provider's discretion.      For prescription refill requests, have your pharmacy contact our office and allow 72 hours for refills to be completed.    Today you received the following chemotherapy and/or immunotherapy agents kyprolis   To help prevent nausea and vomiting after your treatment, we encourage you to take your nausea medication as directed.  BELOW ARE SYMPTOMS THAT SHOULD BE REPORTED IMMEDIATELY: *FEVER GREATER THAN 100.4 F (38 C) OR HIGHER *CHILLS OR SWEATING *NAUSEA AND VOMITING THAT IS NOT CONTROLLED WITH YOUR NAUSEA MEDICATION *UNUSUAL SHORTNESS OF BREATH *UNUSUAL BRUISING OR BLEEDING *URINARY PROBLEMS (pain or burning when urinating, or frequent urination) *BOWEL PROBLEMS (unusual diarrhea, constipation, pain near the anus) TENDERNESS IN MOUTH AND THROAT WITH OR WITHOUT PRESENCE OF ULCERS (sore throat, sores in mouth, or a toothache) UNUSUAL RASH, SWELLING OR PAIN  UNUSUAL VAGINAL DISCHARGE OR ITCHING   Items with * indicate a potential emergency and should be followed up as soon as possible or go to the Emergency Department if any problems should occur.  Please show the CHEMOTHERAPY ALERT CARD or IMMUNOTHERAPY ALERT CARD at check-in to the  Emergency Department and triage nurse. Should you have questions after your visit or need to cancel or reschedule your appointment, please contact [facility]  [phone] and follow the prompts.  Office hours are 8:00 a.m. to 4:30 p.m. Monday - Friday. Please note that voicemails left after 4:00 p.m. may not be returned until the following business day.  We are closed weekends and major holidays. You have access to a nurse at all times for urgent questions. Please call the main number to the clinic [phone] and follow the prompts.  For any non-urgent questions, you may also contact your provider using MyChart. We now offer e-Visits for anyone 77 and older to request care online for non-urgent symptoms. For details visit [email or web address].   Also download the MyChart app! Go to the app store, search "MyChart", open the app, select [facility], and log in with your MyChart username and password.  Due to Covid, a mask is required upon entering the hospital/clinic. If you do not have a mask, one will be given to you upon arrival. For doctor visits, patients may have 1 support person aged 19 or older with them. For treatment visits, patients cannot have anyone with them due to current Covid guidelines and our immunocompromised population.

## 2021-01-02 DIAGNOSIS — I2510 Atherosclerotic heart disease of native coronary artery without angina pectoris: Secondary | ICD-10-CM | POA: Diagnosis not present

## 2021-01-02 DIAGNOSIS — J449 Chronic obstructive pulmonary disease, unspecified: Secondary | ICD-10-CM | POA: Diagnosis not present

## 2021-01-02 DIAGNOSIS — D61811 Other drug-induced pancytopenia: Secondary | ICD-10-CM | POA: Diagnosis not present

## 2021-01-02 DIAGNOSIS — R2243 Localized swelling, mass and lump, lower limb, bilateral: Secondary | ICD-10-CM | POA: Diagnosis not present

## 2021-01-02 DIAGNOSIS — I4510 Unspecified right bundle-branch block: Secondary | ICD-10-CM | POA: Diagnosis not present

## 2021-01-02 DIAGNOSIS — I10 Essential (primary) hypertension: Secondary | ICD-10-CM | POA: Diagnosis not present

## 2021-01-02 DIAGNOSIS — I5033 Acute on chronic diastolic (congestive) heart failure: Secondary | ICD-10-CM | POA: Diagnosis not present

## 2021-01-02 DIAGNOSIS — R0989 Other specified symptoms and signs involving the circulatory and respiratory systems: Secondary | ICD-10-CM | POA: Diagnosis not present

## 2021-01-02 DIAGNOSIS — R059 Cough, unspecified: Secondary | ICD-10-CM | POA: Diagnosis not present

## 2021-01-02 DIAGNOSIS — Z20822 Contact with and (suspected) exposure to covid-19: Secondary | ICD-10-CM | POA: Diagnosis not present

## 2021-01-02 DIAGNOSIS — I517 Cardiomegaly: Secondary | ICD-10-CM | POA: Diagnosis not present

## 2021-01-02 DIAGNOSIS — I130 Hypertensive heart and chronic kidney disease with heart failure and stage 1 through stage 4 chronic kidney disease, or unspecified chronic kidney disease: Secondary | ICD-10-CM | POA: Diagnosis not present

## 2021-01-02 DIAGNOSIS — C9000 Multiple myeloma not having achieved remission: Secondary | ICD-10-CM | POA: Diagnosis not present

## 2021-01-02 DIAGNOSIS — I509 Heart failure, unspecified: Secondary | ICD-10-CM | POA: Diagnosis not present

## 2021-01-02 DIAGNOSIS — R062 Wheezing: Secondary | ICD-10-CM | POA: Diagnosis not present

## 2021-01-02 DIAGNOSIS — Z955 Presence of coronary angioplasty implant and graft: Secondary | ICD-10-CM | POA: Diagnosis not present

## 2021-01-02 DIAGNOSIS — R0789 Other chest pain: Secondary | ICD-10-CM | POA: Diagnosis not present

## 2021-01-02 DIAGNOSIS — I5023 Acute on chronic systolic (congestive) heart failure: Secondary | ICD-10-CM | POA: Diagnosis not present

## 2021-01-02 DIAGNOSIS — R0602 Shortness of breath: Secondary | ICD-10-CM | POA: Diagnosis not present

## 2021-01-02 DIAGNOSIS — I471 Supraventricular tachycardia: Secondary | ICD-10-CM | POA: Diagnosis not present

## 2021-01-02 DIAGNOSIS — Z9481 Bone marrow transplant status: Secondary | ICD-10-CM | POA: Diagnosis not present

## 2021-01-02 DIAGNOSIS — J984 Other disorders of lung: Secondary | ICD-10-CM | POA: Diagnosis not present

## 2021-01-03 DIAGNOSIS — E876 Hypokalemia: Secondary | ICD-10-CM | POA: Diagnosis not present

## 2021-01-03 DIAGNOSIS — I10 Essential (primary) hypertension: Secondary | ICD-10-CM | POA: Diagnosis not present

## 2021-01-03 DIAGNOSIS — N1831 Chronic kidney disease, stage 3a: Secondary | ICD-10-CM | POA: Diagnosis not present

## 2021-01-03 DIAGNOSIS — I5023 Acute on chronic systolic (congestive) heart failure: Secondary | ICD-10-CM | POA: Diagnosis not present

## 2021-01-03 DIAGNOSIS — D61818 Other pancytopenia: Secondary | ICD-10-CM | POA: Diagnosis not present

## 2021-01-03 DIAGNOSIS — C9000 Multiple myeloma not having achieved remission: Secondary | ICD-10-CM | POA: Diagnosis not present

## 2021-01-03 DIAGNOSIS — I5033 Acute on chronic diastolic (congestive) heart failure: Secondary | ICD-10-CM | POA: Diagnosis not present

## 2021-01-04 DIAGNOSIS — I3139 Other pericardial effusion (noninflammatory): Secondary | ICD-10-CM | POA: Diagnosis not present

## 2021-01-04 DIAGNOSIS — I471 Supraventricular tachycardia: Secondary | ICD-10-CM | POA: Diagnosis present

## 2021-01-04 DIAGNOSIS — D61811 Other drug-induced pancytopenia: Secondary | ICD-10-CM | POA: Diagnosis present

## 2021-01-04 DIAGNOSIS — M25551 Pain in right hip: Secondary | ICD-10-CM | POA: Diagnosis not present

## 2021-01-04 DIAGNOSIS — I5023 Acute on chronic systolic (congestive) heart failure: Secondary | ICD-10-CM | POA: Diagnosis present

## 2021-01-04 DIAGNOSIS — D696 Thrombocytopenia, unspecified: Secondary | ICD-10-CM | POA: Diagnosis present

## 2021-01-04 DIAGNOSIS — I2582 Chronic total occlusion of coronary artery: Secondary | ICD-10-CM | POA: Diagnosis present

## 2021-01-04 DIAGNOSIS — D61818 Other pancytopenia: Secondary | ICD-10-CM | POA: Diagnosis not present

## 2021-01-04 DIAGNOSIS — E785 Hyperlipidemia, unspecified: Secondary | ICD-10-CM | POA: Diagnosis present

## 2021-01-04 DIAGNOSIS — C9001 Multiple myeloma in remission: Secondary | ICD-10-CM | POA: Diagnosis not present

## 2021-01-04 DIAGNOSIS — C9000 Multiple myeloma not having achieved remission: Secondary | ICD-10-CM | POA: Diagnosis present

## 2021-01-04 DIAGNOSIS — I10 Essential (primary) hypertension: Secondary | ICD-10-CM | POA: Diagnosis not present

## 2021-01-04 DIAGNOSIS — I5033 Acute on chronic diastolic (congestive) heart failure: Secondary | ICD-10-CM | POA: Diagnosis not present

## 2021-01-04 DIAGNOSIS — I130 Hypertensive heart and chronic kidney disease with heart failure and stage 1 through stage 4 chronic kidney disease, or unspecified chronic kidney disease: Secondary | ICD-10-CM | POA: Diagnosis present

## 2021-01-04 DIAGNOSIS — E039 Hypothyroidism, unspecified: Secondary | ICD-10-CM | POA: Diagnosis present

## 2021-01-04 DIAGNOSIS — I519 Heart disease, unspecified: Secondary | ICD-10-CM | POA: Diagnosis not present

## 2021-01-04 DIAGNOSIS — H268 Other specified cataract: Secondary | ICD-10-CM | POA: Diagnosis present

## 2021-01-04 DIAGNOSIS — I2510 Atherosclerotic heart disease of native coronary artery without angina pectoris: Secondary | ICD-10-CM | POA: Diagnosis present

## 2021-01-04 DIAGNOSIS — E876 Hypokalemia: Secondary | ICD-10-CM | POA: Diagnosis present

## 2021-01-04 DIAGNOSIS — N1831 Chronic kidney disease, stage 3a: Secondary | ICD-10-CM | POA: Diagnosis present

## 2021-01-04 DIAGNOSIS — I480 Paroxysmal atrial fibrillation: Secondary | ICD-10-CM | POA: Diagnosis present

## 2021-01-04 DIAGNOSIS — K219 Gastro-esophageal reflux disease without esophagitis: Secondary | ICD-10-CM | POA: Diagnosis present

## 2021-01-04 DIAGNOSIS — Z66 Do not resuscitate: Secondary | ICD-10-CM | POA: Diagnosis present

## 2021-01-04 DIAGNOSIS — I5189 Other ill-defined heart diseases: Secondary | ICD-10-CM | POA: Diagnosis not present

## 2021-01-04 DIAGNOSIS — I959 Hypotension, unspecified: Secondary | ICD-10-CM | POA: Diagnosis not present

## 2021-01-04 DIAGNOSIS — J439 Emphysema, unspecified: Secondary | ICD-10-CM | POA: Diagnosis present

## 2021-01-04 DIAGNOSIS — I248 Other forms of acute ischemic heart disease: Secondary | ICD-10-CM | POA: Diagnosis present

## 2021-01-04 DIAGNOSIS — I5021 Acute systolic (congestive) heart failure: Secondary | ICD-10-CM | POA: Diagnosis not present

## 2021-01-04 DIAGNOSIS — R042 Hemoptysis: Secondary | ICD-10-CM | POA: Diagnosis not present

## 2021-01-04 DIAGNOSIS — I452 Bifascicular block: Secondary | ICD-10-CM | POA: Diagnosis present

## 2021-01-04 DIAGNOSIS — Z9481 Bone marrow transplant status: Secondary | ICD-10-CM | POA: Diagnosis not present

## 2021-01-04 DIAGNOSIS — Z20822 Contact with and (suspected) exposure to covid-19: Secondary | ICD-10-CM | POA: Diagnosis present

## 2021-01-04 DIAGNOSIS — I509 Heart failure, unspecified: Secondary | ICD-10-CM | POA: Diagnosis not present

## 2021-01-04 DIAGNOSIS — N400 Enlarged prostate without lower urinary tract symptoms: Secondary | ICD-10-CM | POA: Diagnosis present

## 2021-01-11 DIAGNOSIS — Z48812 Encounter for surgical aftercare following surgery on the circulatory system: Secondary | ICD-10-CM | POA: Diagnosis not present

## 2021-01-11 DIAGNOSIS — I2510 Atherosclerotic heart disease of native coronary artery without angina pectoris: Secondary | ICD-10-CM | POA: Diagnosis not present

## 2021-01-12 DIAGNOSIS — C9000 Multiple myeloma not having achieved remission: Secondary | ICD-10-CM | POA: Diagnosis not present

## 2021-01-12 DIAGNOSIS — I5020 Unspecified systolic (congestive) heart failure: Secondary | ICD-10-CM | POA: Diagnosis not present

## 2021-01-12 DIAGNOSIS — I2510 Atherosclerotic heart disease of native coronary artery without angina pectoris: Secondary | ICD-10-CM | POA: Diagnosis not present

## 2021-01-12 DIAGNOSIS — I428 Other cardiomyopathies: Secondary | ICD-10-CM | POA: Diagnosis not present

## 2021-01-12 DIAGNOSIS — Z9989 Dependence on other enabling machines and devices: Secondary | ICD-10-CM | POA: Diagnosis not present

## 2021-01-12 DIAGNOSIS — G4733 Obstructive sleep apnea (adult) (pediatric): Secondary | ICD-10-CM | POA: Diagnosis not present

## 2021-01-12 DIAGNOSIS — Z48812 Encounter for surgical aftercare following surgery on the circulatory system: Secondary | ICD-10-CM | POA: Diagnosis not present

## 2021-01-14 DIAGNOSIS — Z48812 Encounter for surgical aftercare following surgery on the circulatory system: Secondary | ICD-10-CM | POA: Diagnosis not present

## 2021-01-14 DIAGNOSIS — I208 Other forms of angina pectoris: Secondary | ICD-10-CM | POA: Diagnosis not present

## 2021-01-18 DIAGNOSIS — I208 Other forms of angina pectoris: Secondary | ICD-10-CM | POA: Diagnosis not present

## 2021-01-18 DIAGNOSIS — Z48812 Encounter for surgical aftercare following surgery on the circulatory system: Secondary | ICD-10-CM | POA: Diagnosis not present

## 2021-01-19 ENCOUNTER — Other Ambulatory Visit: Payer: Medicare Other

## 2021-01-19 ENCOUNTER — Ambulatory Visit: Payer: Medicare Other | Admitting: Sports Medicine

## 2021-01-19 ENCOUNTER — Ambulatory Visit: Payer: Medicare Other | Admitting: Family

## 2021-01-19 ENCOUNTER — Ambulatory Visit: Payer: Medicare Other

## 2021-01-19 DIAGNOSIS — Z48812 Encounter for surgical aftercare following surgery on the circulatory system: Secondary | ICD-10-CM | POA: Diagnosis not present

## 2021-01-19 DIAGNOSIS — I208 Other forms of angina pectoris: Secondary | ICD-10-CM | POA: Diagnosis not present

## 2021-01-21 DIAGNOSIS — I208 Other forms of angina pectoris: Secondary | ICD-10-CM | POA: Diagnosis not present

## 2021-01-21 DIAGNOSIS — Z48812 Encounter for surgical aftercare following surgery on the circulatory system: Secondary | ICD-10-CM | POA: Diagnosis not present

## 2021-01-22 ENCOUNTER — Other Ambulatory Visit: Payer: Self-pay

## 2021-01-22 ENCOUNTER — Inpatient Hospital Stay: Payer: Medicare Other | Attending: Hematology & Oncology

## 2021-01-22 ENCOUNTER — Inpatient Hospital Stay (HOSPITAL_BASED_OUTPATIENT_CLINIC_OR_DEPARTMENT_OTHER): Payer: Medicare Other | Admitting: Family

## 2021-01-22 ENCOUNTER — Inpatient Hospital Stay: Payer: Medicare Other

## 2021-01-22 VITALS — BP 115/52 | HR 69 | Temp 98.1°F | Resp 19 | Wt 209.0 lb

## 2021-01-22 DIAGNOSIS — Z86718 Personal history of other venous thrombosis and embolism: Secondary | ICD-10-CM | POA: Diagnosis not present

## 2021-01-22 DIAGNOSIS — C9002 Multiple myeloma in relapse: Secondary | ICD-10-CM | POA: Diagnosis not present

## 2021-01-22 DIAGNOSIS — Z9221 Personal history of antineoplastic chemotherapy: Secondary | ICD-10-CM | POA: Insufficient documentation

## 2021-01-22 DIAGNOSIS — D500 Iron deficiency anemia secondary to blood loss (chronic): Secondary | ICD-10-CM | POA: Diagnosis not present

## 2021-01-22 DIAGNOSIS — C9000 Multiple myeloma not having achieved remission: Secondary | ICD-10-CM

## 2021-01-22 DIAGNOSIS — M25551 Pain in right hip: Secondary | ICD-10-CM

## 2021-01-22 DIAGNOSIS — M899 Disorder of bone, unspecified: Secondary | ICD-10-CM

## 2021-01-22 DIAGNOSIS — Z9484 Stem cells transplant status: Secondary | ICD-10-CM | POA: Diagnosis not present

## 2021-01-22 LAB — CBC WITH DIFFERENTIAL (CANCER CENTER ONLY)
Abs Immature Granulocytes: 0.01 10*3/uL (ref 0.00–0.07)
Basophils Absolute: 0 10*3/uL (ref 0.0–0.1)
Basophils Relative: 0 %
Eosinophils Absolute: 0 10*3/uL (ref 0.0–0.5)
Eosinophils Relative: 0 %
HCT: 29.7 % — ABNORMAL LOW (ref 39.0–52.0)
Hemoglobin: 9.5 g/dL — ABNORMAL LOW (ref 13.0–17.0)
Immature Granulocytes: 0 %
Lymphocytes Relative: 21 %
Lymphs Abs: 0.9 10*3/uL (ref 0.7–4.0)
MCH: 31.6 pg (ref 26.0–34.0)
MCHC: 32 g/dL (ref 30.0–36.0)
MCV: 98.7 fL (ref 80.0–100.0)
Monocytes Absolute: 0.6 10*3/uL (ref 0.1–1.0)
Monocytes Relative: 13 %
Neutro Abs: 2.7 10*3/uL (ref 1.7–7.7)
Neutrophils Relative %: 66 %
Platelet Count: 196 10*3/uL (ref 150–400)
RBC: 3.01 MIL/uL — ABNORMAL LOW (ref 4.22–5.81)
RDW: 14.6 % (ref 11.5–15.5)
WBC Count: 4.2 10*3/uL (ref 4.0–10.5)
nRBC: 0 % (ref 0.0–0.2)

## 2021-01-22 LAB — CMP (CANCER CENTER ONLY)
ALT: 13 U/L (ref 0–44)
AST: 16 U/L (ref 15–41)
Albumin: 3.8 g/dL (ref 3.5–5.0)
Alkaline Phosphatase: 89 U/L (ref 38–126)
Anion gap: 8 (ref 5–15)
BUN: 17 mg/dL (ref 8–23)
CO2: 27 mmol/L (ref 22–32)
Calcium: 9.5 mg/dL (ref 8.9–10.3)
Chloride: 107 mmol/L (ref 98–111)
Creatinine: 1.6 mg/dL — ABNORMAL HIGH (ref 0.61–1.24)
GFR, Estimated: 43 mL/min — ABNORMAL LOW (ref >60)
Glucose, Bld: 137 mg/dL — ABNORMAL HIGH (ref 70–99)
Potassium: 3.8 mmol/L (ref 3.5–5.1)
Sodium: 142 mmol/L (ref 135–145)
Total Bilirubin: 0.5 mg/dL (ref 0.3–1.2)
Total Protein: 6 g/dL — ABNORMAL LOW (ref 6.5–8.1)

## 2021-01-22 LAB — LACTATE DEHYDROGENASE: LDH: 185 U/L (ref 98–192)

## 2021-01-22 NOTE — Progress Notes (Signed)
Hematology and Oncology Follow Up Visit  [name] [name] [id number] December 02, 1940 80 y.o. 01/22/2021   Principle Diagnosis:  IgG kappa myeloma - Relapsed -- 1q duplication Acute thromboembolism of the right lower leg   Past Therapy:        S/P ASCT at [facility] on 04/02/2015 Patient s/p cycle 5 of Velcade/Revlimid/Decadron Revlimid 10mg  po q day (21/28) -- d/c on 09/15/2017   Current Therapy:     Faspro/Velcade/Decadron -- s/p cycle 6- started 03/20/2020   -- d/c velcade and start Kyprolis on 09/27/2020 Zometa 4 mg IV every 2 months  - next dose due 02/2021 Xarelto 10 mg by mouth daily   Interim History:  [name] [name] is here today for follow-up and treatment. Unfortunately he was hospitalized a couple days after his last treatment for CHF and atrial fib.  He states that he will start wearing a Holter monitor sometime next week.  He is fatigue and doing cardiac rehab 3 days a week.  He is having pain in the right hip that radiates into his lower back. It is effecting his mobility and he has problems getting comfortable and sleeping.  Neuropathy in his lower extremities is unchanged.  No falls or syncope reported.  M-spike not observed in November. IgG level was 402 mg/dL and 2.90 mg/dL.  No fever, chills, n/v, cough, rash, dizziness, chest pain, palpitations, abdominal pain or changes in bowel or bladder habits.  He has not noted any blood loss. No abnormal bruising, no petechiae.  He has been eating a low sodium diet and hydrating properly throughout the day. He states that he weighs daily and this has remained stable.   ECOG Performance Status: 1 - Symptomatic but completely ambulatory  Medications:  Allergies as of 01/22/2021       Reactions   Budesonide-formoterol Fumarate Itching, Other (See Comments)   Codeine Itching   Hydrocodone-acetaminophen Itching   Mushroom Extract Complex Nausea And Vomiting, Other (See Comments)   Only shitake mushroom  causes this reaction. Flu-like  symptoms from portabello mushrooms        Medication List        Accurate as of January 22, 2021 12:15 PM. If you have any questions, ask your nurse or doctor.          benzonatate 200 MG capsule Commonly known as: TESSALON Take 1 capsule (200 mg total) by mouth 3 (three) times daily as needed for cough.   cetirizine 10 MG tablet Commonly known as: EQ Allergy Relief (Cetirizine) Take 1 tablet (10 mg total) by mouth daily.   clopidogrel 75 MG tablet Commonly known as: PLAVIX Take 75 mg by mouth daily.   Cyanocobalamin 1000 MCG Tbcr Take 1 tablet by mouth daily.   dexamethasone 4 MG tablet Commonly known as: DECADRON TAKE 5 TABLETS (20 MG TOTAL) BY MOUTH SEE ADMIN INSTRUCTIONS (TAKE 5 TABLETS ONCE ON THE DAY OF TREATMENT)   empagliflozin 10 MG Tabs tablet Commonly known as: JARDIANCE Take by mouth.   esomeprazole 40 MG capsule Commonly known as: NEXIUM Take 1 capsule (40 mg total) by mouth daily at 12 noon.   famciclovir 500 MG tablet Commonly known as: FAMVIR Take 1 tablet (500 mg total) by mouth daily.   fluticasone 50 MCG/ACT nasal spray Commonly known as: FLONASE Place 2 sprays into both nostrils daily.   furosemide 20 MG tablet Commonly known as: LASIX Take 20 mg by mouth daily as needed.   gabapentin 600 MG tablet Commonly known as: NEURONTIN Take 1,800 mg  by mouth daily.   hydrocortisone 2.5 % ointment Apply topically 2 (two) times daily.   isosorbide mononitrate 60 MG 24 hr tablet Commonly known as: IMDUR Take 60 mg by mouth in the morning and at bedtime.   levothyroxine 100 MCG tablet Commonly known as: Synthroid Take 1 tablet (100 mcg total) by mouth daily before breakfast.   LORazepam 1 MG tablet Commonly known as: ATIVAN Take 1 mg by mouth at bedtime.   LORazepam 1 MG tablet Commonly known as: ATIVAN Take 1 mg by mouth every 6 (six) hours as needed.   metoprolol succinate 25 MG 24 hr tablet Commonly known as: TOPROL-XL Take 0.5  tablets (12.5 mg total) by mouth daily.   nitroGLYCERIN 0.4 MG SL tablet Commonly known as: NITROSTAT PLACE 1 TABLET UNDER THE TONGUE EVERY 5 (FIVE) MINUTES AS NEEDED FOR CHEST PAIN AS DIRECTED   ondansetron 8 MG tablet Commonly known as: Zofran Take 1 tablet (8 mg total) by mouth 2 (two) times daily as needed (Nausea or vomiting).   oxyCODONE-acetaminophen 10-325 MG tablet Commonly known as: PERCOCET Take 1 tablet by mouth every 8 (eight) hours as needed for pain. for pain   potassium chloride SA 20 MEQ tablet Commonly known as: KLOR-CON M TAKE 1 TABLET TWICE DAILY   predniSONE 10 MG (48) Tbpk tablet Commonly known as: STERAPRED UNI-PAK 48 TAB Take by mouth daily. 12-day taper pack, use as directed for taper   prochlorperazine 10 MG tablet Commonly known as: COMPAZINE Take 1 tablet (10 mg total) by mouth every 6 (six) hours as needed (Nausea or vomiting).   pyridoxine 100 MG tablet Commonly known as: B-6 Take 100 mg by mouth daily.   Repatha SureClick 144 MG/ML Soaj Generic drug: Evolocumab Inject 140 mg into the skin every 14 (fourteen) days.   rivaroxaban 10 MG Tabs tablet Commonly known as: XARELTO Take 1 tablet (10 mg total) by mouth daily with supper.   sacubitril-valsartan 97-103 MG Commonly known as: ENTRESTO Take 1 tablet by mouth 2 (two) times daily.   tamsulosin 0.4 MG Caps capsule Commonly known as: FLOMAX Take 1 capsule (0.4 mg total) by mouth daily after supper.   Zoledronic Acid 4 MG Solr Inject into the vein.        Allergies:  Allergies  Allergen Reactions   Budesonide-Formoterol Fumarate Itching and Other (See Comments)   Codeine Itching   Hydrocodone-Acetaminophen Itching   Mushroom Extract Complex Nausea And Vomiting and Other (See Comments)    Only shitake mushroom  causes this reaction. Flu-like symptoms from portabello mushrooms    Past Medical History, Surgical history, Social history, and Family History were reviewed and  updated.  Review of Systems: All other 10 point review of systems is negative.   Physical Exam:  vitals were not taken for this visit.   Wt Readings from Last 3 Encounters:  12/25/20 211 lb (95.7 kg)  11/27/20 210 lb (95.3 kg)  11/06/20 213 lb 0.6 oz (96.6 kg)    Ocular: Sclerae unicteric, pupils equal, round and reactive to light Ear-nose-throat: Oropharynx clear, dentition fair Lymphatic: No cervical or supraclavicular adenopathy Lungs no rales or rhonchi, good excursion bilaterally Heart regular rate and rhythm, no murmur appreciated Abd soft, nontender, positive bowel sounds MSK no focal spinal tenderness, no joint edema Neuro: non-focal, well-oriented, appropriate affect Breasts: Deferred   Lab Results  Component Value Date   WBC 4.2 01/22/2021   HGB 9.5 (L) 01/22/2021   HCT 29.7 (L) 01/22/2021   MCV 98.7  01/22/2021   PLT 196 01/22/2021   Lab Results  Component Value Date   FERRITIN 68 09/22/2020   IRON 81 09/22/2020   TIBC 293 09/22/2020   UIBC 211 09/22/2020   IRONPCTSAT 28 09/22/2020   Lab Results  Component Value Date   RETICCTPCT 1.7 07/18/2019   RBC 3.01 (L) 01/22/2021   Lab Results  Component Value Date   KPAFRELGTCHN 29.0 (H) 12/25/2020   LAMBDASER 2.7 (L) 12/25/2020   KAPLAMBRATIO 10.74 (H) 12/25/2020   Lab Results  Component Value Date   IGGSERUM 402 (L) 12/25/2020   IGA 9 (L) 12/25/2020   IGMSERUM 15 12/25/2020   Lab Results  Component Value Date   TOTALPROTELP 5.5 (L) 12/25/2020   ALBUMINELP 3.4 12/25/2020   A1GS 0.2 12/25/2020   A2GS 0.7 12/25/2020   BETS 0.8 12/25/2020   BETA2SER 0.3 02/11/2015   GAMS 0.3 (L) 12/25/2020   MSPIKE Not Observed 12/25/2020   SPEI Comment 09/22/2020     Chemistry      Component Value Date/Time   NA 140 01/01/2021 0924   NA 145 (H) 06/28/2019 1501   NA 140 01/27/2017 1044   NA 141 06/05/2015 1036   K 4.1 01/01/2021 0924   K 3.3 01/27/2017 1044   K 2.9 (LL) 06/05/2015 1036   CL 105 01/01/2021  0924   CL 101 01/27/2017 1044   CO2 26 01/01/2021 0924   CO2 28 01/27/2017 1044   CO2 27 06/05/2015 1036   BUN 17 01/01/2021 0924   BUN 15 06/28/2019 1501   BUN 14 01/27/2017 1044   BUN 13.1 06/05/2015 1036   CREATININE 1.35 (H) 01/01/2021 0924   CREATININE 2.49 (H) 11/25/2020 0000   CREATININE 1.0 06/05/2015 1036   GLU 152 03/30/2016 0000      Component Value Date/Time   CALCIUM 9.1 01/01/2021 0924   CALCIUM 8.9 01/27/2017 1044   CALCIUM 9.4 06/05/2015 1036   ALKPHOS 79 01/01/2021 0924   ALKPHOS 126 (H) 01/27/2017 1044   ALKPHOS 94 06/05/2015 1036   AST 19 01/01/2021 0924   AST 22 06/05/2015 1036   ALT 19 01/01/2021 0924   ALT 33 01/27/2017 1044   ALT 18 06/05/2015 1036   BILITOT 1.0 01/01/2021 0924   BILITOT 0.59 06/05/2015 1036       Impression and Plan: Mr. Barnier is a very pleasant 80 yo caucasian gentleman with history of IgG kappa myeloma. He underwent standard induction chemotherapy and then ultimately stem cell transplant at [facility] in February 2017.  No treatment today per MD. Dr. Marin Olp considering other available options to changed patient to. Kyprolis discontinued due to cardiac symptoms . Patient in agreement with the plan. Protein studies are pending. He has supplies to collect 24 hr urine.  We will get a PET scan to assess for cause of right hip pain.  MD follow-up in 3 weeks.   Lottie Dawson, NP 12/9/202212:15 PM

## 2021-01-23 LAB — IGG, IGA, IGM
IgA: 9 mg/dL — ABNORMAL LOW (ref 61–437)
IgG (Immunoglobin G), Serum: 368 mg/dL — ABNORMAL LOW (ref 603–1613)
IgM (Immunoglobulin M), Srm: 10 mg/dL — ABNORMAL LOW (ref 15–143)

## 2021-01-25 ENCOUNTER — Telehealth: Payer: Self-pay

## 2021-01-25 ENCOUNTER — Inpatient Hospital Stay: Payer: Medicare Other

## 2021-01-25 ENCOUNTER — Other Ambulatory Visit: Payer: Self-pay

## 2021-01-25 DIAGNOSIS — Z9221 Personal history of antineoplastic chemotherapy: Secondary | ICD-10-CM | POA: Diagnosis not present

## 2021-01-25 DIAGNOSIS — C9000 Multiple myeloma not having achieved remission: Secondary | ICD-10-CM

## 2021-01-25 DIAGNOSIS — Z48812 Encounter for surgical aftercare following surgery on the circulatory system: Secondary | ICD-10-CM | POA: Diagnosis not present

## 2021-01-25 DIAGNOSIS — I208 Other forms of angina pectoris: Secondary | ICD-10-CM | POA: Diagnosis not present

## 2021-01-25 DIAGNOSIS — Z9484 Stem cells transplant status: Secondary | ICD-10-CM | POA: Diagnosis not present

## 2021-01-25 DIAGNOSIS — Z86718 Personal history of other venous thrombosis and embolism: Secondary | ICD-10-CM | POA: Diagnosis not present

## 2021-01-25 DIAGNOSIS — C9002 Multiple myeloma in relapse: Secondary | ICD-10-CM | POA: Diagnosis not present

## 2021-01-25 LAB — KAPPA/LAMBDA LIGHT CHAINS
Kappa free light chain: 24.9 mg/L — ABNORMAL HIGH (ref 3.3–19.4)
Kappa, lambda light chain ratio: 7.11 — ABNORMAL HIGH (ref 0.26–1.65)
Lambda free light chains: 3.5 mg/L — ABNORMAL LOW (ref 5.7–26.3)

## 2021-01-25 NOTE — Telephone Encounter (Signed)
-----   Message from [name], MD sent at 01/25/2021  1:52 PM EST -----   Call and let him know that the light chains are still coming down.  This is fantastic news.

## 2021-01-25 NOTE — Telephone Encounter (Signed)
Called and left message with patient about lab work and to call back if he has any questions or concerns

## 2021-01-26 DIAGNOSIS — Z48812 Encounter for surgical aftercare following surgery on the circulatory system: Secondary | ICD-10-CM | POA: Diagnosis not present

## 2021-01-26 DIAGNOSIS — I208 Other forms of angina pectoris: Secondary | ICD-10-CM | POA: Diagnosis not present

## 2021-01-26 LAB — PROTEIN ELECTROPHORESIS, SERUM, WITH REFLEX
A/G Ratio: 1.3 (ref 0.7–1.7)
Albumin ELP: 3.1 g/dL (ref 2.9–4.4)
Alpha-1-Globulin: 0.3 g/dL (ref 0.0–0.4)
Alpha-2-Globulin: 0.9 g/dL (ref 0.4–1.0)
Beta Globulin: 0.9 g/dL (ref 0.7–1.3)
Gamma Globulin: 0.3 g/dL — ABNORMAL LOW (ref 0.4–1.8)
Globulin, Total: 2.4 g/dL (ref 2.2–3.9)
Total Protein ELP: 5.5 g/dL — ABNORMAL LOW (ref 6.0–8.5)

## 2021-01-27 DIAGNOSIS — Z23 Encounter for immunization: Secondary | ICD-10-CM | POA: Diagnosis not present

## 2021-01-27 LAB — UPEP/UIFE/LIGHT CHAINS/TP, 24-HR UR
% BETA, Urine: 35.5 %
ALPHA 1 URINE: 12.3 %
Albumin, U: 24.8 %
Alpha 2, Urine: 19.7 %
Free Kappa Lt Chains,Ur: 85.73 mg/L (ref 1.17–86.46)
Free Kappa/Lambda Ratio: 8.93 (ref 1.83–14.26)
Free Lambda Lt Chains,Ur: 9.6 mg/L (ref 0.27–15.21)
GAMMA GLOBULIN URINE: 7.7 %
Total Protein, Urine-Ur/day: 175 mg/24 hr — ABNORMAL HIGH (ref 30–150)
Total Protein, Urine: 14.6 mg/dL
Total Volume: 1200

## 2021-01-28 ENCOUNTER — Encounter: Payer: Self-pay | Admitting: *Deleted

## 2021-01-29 ENCOUNTER — Other Ambulatory Visit: Payer: Self-pay

## 2021-01-29 ENCOUNTER — Ambulatory Visit (HOSPITAL_COMMUNITY)
Admission: RE | Admit: 2021-01-29 | Discharge: 2021-01-29 | Disposition: A | Discharge status: Home / Self Care | Payer: Medicare Other | Source: Ambulatory Visit | Attending: Family | Admitting: Family

## 2021-01-29 DIAGNOSIS — C9000 Multiple myeloma not having achieved remission: Secondary | ICD-10-CM | POA: Diagnosis not present

## 2021-01-29 DIAGNOSIS — M899 Disorder of bone, unspecified: Secondary | ICD-10-CM | POA: Insufficient documentation

## 2021-01-29 DIAGNOSIS — M25551 Pain in right hip: Secondary | ICD-10-CM | POA: Insufficient documentation

## 2021-01-29 LAB — GLUCOSE, CAPILLARY: Glucose-Capillary: 113 mg/dL — ABNORMAL HIGH (ref 70–99)

## 2021-01-29 MED ORDER — FLUDEOXYGLUCOSE F - 18 (FDG) INJECTION
10.5000 | Freq: Once | INTRAVENOUS | Status: AC
  Administered 2021-01-29: 10.5 via INTRAVENOUS

## 2021-01-30 ENCOUNTER — Encounter: Payer: Self-pay | Admitting: Family

## 2021-02-01 ENCOUNTER — Encounter: Payer: Self-pay | Admitting: *Deleted

## 2021-02-01 DIAGNOSIS — I208 Other forms of angina pectoris: Secondary | ICD-10-CM | POA: Diagnosis not present

## 2021-02-01 DIAGNOSIS — Z48812 Encounter for surgical aftercare following surgery on the circulatory system: Secondary | ICD-10-CM | POA: Diagnosis not present

## 2021-02-02 DIAGNOSIS — I5020 Unspecified systolic (congestive) heart failure: Secondary | ICD-10-CM | POA: Diagnosis not present

## 2021-02-02 DIAGNOSIS — Z48812 Encounter for surgical aftercare following surgery on the circulatory system: Secondary | ICD-10-CM | POA: Diagnosis not present

## 2021-02-02 DIAGNOSIS — I429 Cardiomyopathy, unspecified: Secondary | ICD-10-CM | POA: Diagnosis not present

## 2021-02-02 DIAGNOSIS — I208 Other forms of angina pectoris: Secondary | ICD-10-CM | POA: Diagnosis not present

## 2021-02-04 DIAGNOSIS — Z48812 Encounter for surgical aftercare following surgery on the circulatory system: Secondary | ICD-10-CM | POA: Diagnosis not present

## 2021-02-04 DIAGNOSIS — I208 Other forms of angina pectoris: Secondary | ICD-10-CM | POA: Diagnosis not present

## 2021-02-08 ENCOUNTER — Other Ambulatory Visit: Payer: Self-pay | Admitting: Hematology & Oncology

## 2021-02-08 ENCOUNTER — Other Ambulatory Visit: Payer: Self-pay | Admitting: Sports Medicine

## 2021-02-08 DIAGNOSIS — C9000 Multiple myeloma not having achieved remission: Secondary | ICD-10-CM

## 2021-02-09 ENCOUNTER — Encounter: Payer: Self-pay | Admitting: Hematology & Oncology

## 2021-02-09 DIAGNOSIS — G4733 Obstructive sleep apnea (adult) (pediatric): Secondary | ICD-10-CM | POA: Diagnosis not present

## 2021-02-09 DIAGNOSIS — Z87891 Personal history of nicotine dependence: Secondary | ICD-10-CM | POA: Diagnosis not present

## 2021-02-09 DIAGNOSIS — Z79890 Hormone replacement therapy (postmenopausal): Secondary | ICD-10-CM | POA: Diagnosis not present

## 2021-02-09 DIAGNOSIS — Z888 Allergy status to other drugs, medicaments and biological substances status: Secondary | ICD-10-CM | POA: Diagnosis not present

## 2021-02-09 DIAGNOSIS — I509 Heart failure, unspecified: Secondary | ICD-10-CM | POA: Diagnosis not present

## 2021-02-09 DIAGNOSIS — J449 Chronic obstructive pulmonary disease, unspecified: Secondary | ICD-10-CM | POA: Diagnosis not present

## 2021-02-09 DIAGNOSIS — I130 Hypertensive heart and chronic kidney disease with heart failure and stage 1 through stage 4 chronic kidney disease, or unspecified chronic kidney disease: Secondary | ICD-10-CM | POA: Diagnosis not present

## 2021-02-09 DIAGNOSIS — Z79899 Other long term (current) drug therapy: Secondary | ICD-10-CM | POA: Diagnosis not present

## 2021-02-09 DIAGNOSIS — N320 Bladder-neck obstruction: Secondary | ICD-10-CM | POA: Diagnosis not present

## 2021-02-09 DIAGNOSIS — Z885 Allergy status to narcotic agent status: Secondary | ICD-10-CM | POA: Diagnosis not present

## 2021-02-09 DIAGNOSIS — E1122 Type 2 diabetes mellitus with diabetic chronic kidney disease: Secondary | ICD-10-CM | POA: Diagnosis not present

## 2021-02-09 DIAGNOSIS — Z7901 Long term (current) use of anticoagulants: Secondary | ICD-10-CM | POA: Diagnosis not present

## 2021-02-09 DIAGNOSIS — C9000 Multiple myeloma not having achieved remission: Secondary | ICD-10-CM | POA: Diagnosis not present

## 2021-02-09 DIAGNOSIS — N138 Other obstructive and reflux uropathy: Secondary | ICD-10-CM | POA: Diagnosis not present

## 2021-02-09 DIAGNOSIS — N401 Enlarged prostate with lower urinary tract symptoms: Secondary | ICD-10-CM | POA: Diagnosis not present

## 2021-02-09 DIAGNOSIS — G4730 Sleep apnea, unspecified: Secondary | ICD-10-CM | POA: Diagnosis not present

## 2021-02-09 DIAGNOSIS — N189 Chronic kidney disease, unspecified: Secondary | ICD-10-CM | POA: Diagnosis not present

## 2021-02-09 DIAGNOSIS — E785 Hyperlipidemia, unspecified: Secondary | ICD-10-CM | POA: Diagnosis not present

## 2021-02-09 DIAGNOSIS — Z91018 Allergy to other foods: Secondary | ICD-10-CM | POA: Diagnosis not present

## 2021-02-09 DIAGNOSIS — I2510 Atherosclerotic heart disease of native coronary artery without angina pectoris: Secondary | ICD-10-CM | POA: Diagnosis not present

## 2021-02-09 DIAGNOSIS — K219 Gastro-esophageal reflux disease without esophagitis: Secondary | ICD-10-CM | POA: Diagnosis not present

## 2021-02-09 DIAGNOSIS — N400 Enlarged prostate without lower urinary tract symptoms: Secondary | ICD-10-CM | POA: Diagnosis not present

## 2021-02-10 DIAGNOSIS — I2510 Atherosclerotic heart disease of native coronary artery without angina pectoris: Secondary | ICD-10-CM | POA: Diagnosis not present

## 2021-02-10 DIAGNOSIS — J449 Chronic obstructive pulmonary disease, unspecified: Secondary | ICD-10-CM | POA: Diagnosis not present

## 2021-02-10 DIAGNOSIS — G4733 Obstructive sleep apnea (adult) (pediatric): Secondary | ICD-10-CM | POA: Diagnosis not present

## 2021-02-10 DIAGNOSIS — N138 Other obstructive and reflux uropathy: Secondary | ICD-10-CM | POA: Diagnosis not present

## 2021-02-10 DIAGNOSIS — Z87891 Personal history of nicotine dependence: Secondary | ICD-10-CM | POA: Diagnosis not present

## 2021-02-10 DIAGNOSIS — N401 Enlarged prostate with lower urinary tract symptoms: Secondary | ICD-10-CM | POA: Diagnosis not present

## 2021-02-12 ENCOUNTER — Inpatient Hospital Stay: Payer: Medicare Other

## 2021-02-12 ENCOUNTER — Inpatient Hospital Stay: Payer: Medicare Other | Admitting: Hematology & Oncology

## 2021-02-16 ENCOUNTER — Other Ambulatory Visit: Payer: Self-pay | Admitting: Sports Medicine

## 2021-02-16 DIAGNOSIS — Z20822 Contact with and (suspected) exposure to covid-19: Secondary | ICD-10-CM | POA: Diagnosis not present

## 2021-02-16 DIAGNOSIS — M47815 Spondylosis without myelopathy or radiculopathy, thoracolumbar region: Secondary | ICD-10-CM

## 2021-02-16 MED ORDER — OXYCODONE-ACETAMINOPHEN 10-325 MG PO TABS: 1.0000 | ORAL_TABLET | Freq: Three times a day (TID) | ORAL | 0 refills | Status: DC | PRN

## 2021-02-18 ENCOUNTER — Inpatient Hospital Stay: Payer: Medicare Other | Attending: Hematology & Oncology

## 2021-02-18 ENCOUNTER — Inpatient Hospital Stay (HOSPITAL_BASED_OUTPATIENT_CLINIC_OR_DEPARTMENT_OTHER): Payer: Medicare Other | Admitting: Hematology & Oncology

## 2021-02-18 ENCOUNTER — Other Ambulatory Visit: Payer: Self-pay

## 2021-02-18 ENCOUNTER — Emergency Department (HOSPITAL_BASED_OUTPATIENT_CLINIC_OR_DEPARTMENT_OTHER)
Admission: EM | Admit: 2021-02-18 | Discharge: 2021-02-18 | Disposition: A | Discharge status: Home / Self Care | Payer: Medicare Other | Attending: Emergency Medicine | Admitting: Emergency Medicine

## 2021-02-18 ENCOUNTER — Inpatient Hospital Stay: Payer: Medicare Other

## 2021-02-18 ENCOUNTER — Encounter (HOSPITAL_BASED_OUTPATIENT_CLINIC_OR_DEPARTMENT_OTHER): Payer: Self-pay | Admitting: *Deleted

## 2021-02-18 ENCOUNTER — Encounter: Payer: Self-pay | Admitting: Hematology & Oncology

## 2021-02-18 VITALS — BP 105/35 | HR 51 | Resp 18

## 2021-02-18 VITALS — BP 70/44 | HR 68 | Temp 98.2°F | Resp 18 | Wt 205.0 lb

## 2021-02-18 DIAGNOSIS — N3000 Acute cystitis without hematuria: Secondary | ICD-10-CM | POA: Diagnosis not present

## 2021-02-18 DIAGNOSIS — C9000 Multiple myeloma not having achieved remission: Secondary | ICD-10-CM

## 2021-02-18 DIAGNOSIS — C9002 Multiple myeloma in relapse: Secondary | ICD-10-CM | POA: Diagnosis not present

## 2021-02-18 DIAGNOSIS — I959 Hypotension, unspecified: Secondary | ICD-10-CM | POA: Insufficient documentation

## 2021-02-18 DIAGNOSIS — D500 Iron deficiency anemia secondary to blood loss (chronic): Secondary | ICD-10-CM

## 2021-02-18 DIAGNOSIS — D509 Iron deficiency anemia, unspecified: Secondary | ICD-10-CM | POA: Diagnosis not present

## 2021-02-18 DIAGNOSIS — M899 Disorder of bone, unspecified: Secondary | ICD-10-CM

## 2021-02-18 DIAGNOSIS — C9001 Multiple myeloma in remission: Secondary | ICD-10-CM

## 2021-02-18 LAB — CBC WITH DIFFERENTIAL (CANCER CENTER ONLY)
Abs Immature Granulocytes: 0.02 10*3/uL (ref 0.00–0.07)
Basophils Absolute: 0 10*3/uL (ref 0.0–0.1)
Basophils Relative: 0 %
Eosinophils Absolute: 0.1 10*3/uL (ref 0.0–0.5)
Eosinophils Relative: 1 %
HCT: 36.4 % — ABNORMAL LOW (ref 39.0–52.0)
Hemoglobin: 11.6 g/dL — ABNORMAL LOW (ref 13.0–17.0)
Immature Granulocytes: 0 %
Lymphocytes Relative: 16 %
Lymphs Abs: 1.2 10*3/uL (ref 0.7–4.0)
MCH: 30.7 pg (ref 26.0–34.0)
MCHC: 31.9 g/dL (ref 30.0–36.0)
MCV: 96.3 fL (ref 80.0–100.0)
Monocytes Absolute: 0.8 10*3/uL (ref 0.1–1.0)
Monocytes Relative: 11 %
Neutro Abs: 5.4 10*3/uL (ref 1.7–7.7)
Neutrophils Relative %: 72 %
Platelet Count: 175 10*3/uL (ref 150–400)
RBC: 3.78 MIL/uL — ABNORMAL LOW (ref 4.22–5.81)
RDW: 14.4 % (ref 11.5–15.5)
WBC Count: 7.5 10*3/uL (ref 4.0–10.5)
nRBC: 0 % (ref 0.0–0.2)

## 2021-02-18 LAB — CMP (CANCER CENTER ONLY)
ALT: 17 U/L (ref 0–44)
AST: 19 U/L (ref 15–41)
Albumin: 4.3 g/dL (ref 3.5–5.0)
Alkaline Phosphatase: 107 U/L (ref 38–126)
Anion gap: 10 (ref 5–15)
BUN: 21 mg/dL (ref 8–23)
CO2: 26 mmol/L (ref 22–32)
Calcium: 9.7 mg/dL (ref 8.9–10.3)
Chloride: 103 mmol/L (ref 98–111)
Creatinine: 1.58 mg/dL — ABNORMAL HIGH (ref 0.61–1.24)
GFR, Estimated: 44 mL/min — ABNORMAL LOW (ref >60)
Glucose, Bld: 110 mg/dL — ABNORMAL HIGH (ref 70–99)
Potassium: 4.4 mmol/L (ref 3.5–5.1)
Sodium: 139 mmol/L (ref 135–145)
Total Bilirubin: 0.5 mg/dL (ref 0.3–1.2)
Total Protein: 6.4 g/dL — ABNORMAL LOW (ref 6.5–8.1)

## 2021-02-18 LAB — URINALYSIS, ROUTINE W REFLEX MICROSCOPIC
Bilirubin Urine: NEGATIVE
Glucose, UA: >=500 mg/dL — AB
Ketones, ur: NEGATIVE mg/dL
Nitrite: NEGATIVE
Protein, ur: 30 mg/dL — AB
Specific Gravity, Urine: <=1.005 (ref 1.005–1.030)
pH: 5.5 (ref 5.0–8.0)

## 2021-02-18 LAB — URINALYSIS, MICROSCOPIC (REFLEX): WBC, UA: >50 WBC/hpf (ref 0–5)

## 2021-02-18 LAB — RETICULOCYTES
Immature Retic Fract: 12.1 % (ref 2.3–15.9)
RBC.: 3.74 MIL/uL — ABNORMAL LOW (ref 4.22–5.81)
Retic Count, Absolute: 46.4 10*3/uL (ref 19.0–186.0)
Retic Ct Pct: 1.2 % (ref 0.4–3.1)

## 2021-02-18 LAB — LACTATE DEHYDROGENASE: LDH: 134 U/L (ref 98–192)

## 2021-02-18 MED ORDER — LACTATED RINGERS IV BOLUS
1000.0000 mL | Freq: Once | INTRAVENOUS | Status: AC
  Administered 2021-02-18: 1000 mL via INTRAVENOUS

## 2021-02-18 MED ORDER — SODIUM CHLORIDE 0.9 % IV SOLN: Freq: Once | INTRAVENOUS | Status: AC

## 2021-02-18 MED ORDER — ZOLEDRONIC ACID 4 MG/100ML IV SOLN
4.0000 mg | Freq: Once | INTRAVENOUS | Status: AC
  Administered 2021-02-18: 4 mg via INTRAVENOUS
  Filled 2021-02-18: qty 100

## 2021-02-18 MED ORDER — CEFTRIAXONE SODIUM 1 G IJ SOLR
1.0000 g | Freq: Once | INTRAMUSCULAR | Status: AC
  Administered 2021-02-18: 1 g via INTRAVENOUS
  Filled 2021-02-18: qty 10

## 2021-02-18 MED ORDER — CEPHALEXIN 500 MG PO CAPS: 500.0000 mg | ORAL_CAPSULE | Freq: Two times a day (BID) | ORAL | 0 refills | Status: AC

## 2021-02-18 NOTE — ED Triage Notes (Signed)
He was at the Cancer center and his BP was low this am. Weakness.

## 2021-02-18 NOTE — ED Notes (Signed)
Pt. Came from [facility] center with c/o low B/P and feeling weak.  Pt. In no distress.  Pt. Has history of EF being in 43s and CHF.

## 2021-02-18 NOTE — ED Provider Notes (Signed)
[facility] Provider Note    CSN: [id number] Arrival date & time: 02/18/21 1433  History Chief Complaint  Patient presents with   Hypotension    [name] [name] is a 81 y.o. male with history of myeloma was at the cancer center this morning for follow up and a Zometa infusion and was noted to be hypotensive. He is surprisingly not having much symptoms. He reports some general weakness, but no fever, CP, SOB, N/V/D, or lightheadedness. He also recently had a TURP and has been having some urinary frequency since then. No bleeding. His CBC and CMP done at [facility] this morning were reviewed and at baseline.    Home Medications Prior to Admission medications   Medication Sig Start Date End Date Taking? Authorizing Provider  cephALEXin (KEFLEX) 500 MG capsule Take 1 capsule (500 mg total) by mouth 2 (two) times daily for 7 days. 02/18/21 02/25/21 Yes Truddie Hidden, MD  cetirizine (ZYRTEC) 10 MG tablet TAKE 1 TABLET EVERY DAY 02/10/21   Silverio Decamp, MD  clopidogrel (PLAVIX) 75 MG tablet Take 75 mg by mouth daily. 05/26/20 05/26/21  [provider]  Cyanocobalamin 1000 MCG TBCR Take 1 tablet by mouth daily.     [provider]  dexamethasone (DECADRON) 4 MG tablet TAKE 5 TABLETS ONCE ON THE DAY OF TREATMENT 02/09/21   [name], MD  empagliflozin (JARDIANCE) 10 MG TABS tablet Take by mouth. 11/05/20   [provider]  esomeprazole (NEXIUM) 40 MG capsule Take 1 capsule (40 mg total) by mouth daily at 12 noon. 07/03/20   Silverio Decamp, MD  Evolocumab (REPATHA SURECLICK) 563 MG/ML SOAJ Inject 140 mg into the skin every 14 (fourteen) days. 11/06/20   Silverio Decamp, MD  famciclovir (FAMVIR) 500 MG tablet TAKE 1 TABLET (500 MG TOTAL) BY MOUTH DAILY. 02/09/21   [name], MD  fluticasone (FLONASE) 50 MCG/ACT nasal spray Place 2 sprays into both nostrils daily. 03/12/20   Silverio Decamp, MD   furosemide (LASIX) 20 MG tablet Take 20 mg by mouth daily as needed. 06/15/20   [provider]  gabapentin (NEURONTIN) 600 MG tablet Take 1,800 mg by mouth daily. 06/08/20   Silverio Decamp, MD  hydrocortisone 2.5 % ointment Apply topically 2 (two) times daily. 05/07/20 05/07/21  [provider]  isosorbide mononitrate (IMDUR) 60 MG 24 hr tablet Take 60 mg by mouth in the morning and at bedtime. Patient not taking: Reported on 01/22/2021 09/08/20   [provider]  levothyroxine (SYNTHROID) 100 MCG tablet Take 1 tablet (100 mcg total) by mouth daily before breakfast. 03/12/20   Silverio Decamp, MD  LORazepam (ATIVAN) 1 MG tablet Take 1 mg by mouth at bedtime.    [provider]  LORazepam (ATIVAN) 1 MG tablet Take 1 mg by mouth every 6 (six) hours as needed.    [provider]  metoprolol succinate (TOPROL-XL) 50 MG 24 hr tablet Take 50 mg by mouth daily. 01/19/21   [provider]  nitroGLYCERIN (NITROSTAT) 0.4 MG SL tablet PLACE 1 TABLET UNDER THE TONGUE EVERY 5 (FIVE) MINUTES AS NEEDED FOR CHEST PAIN AS DIRECTED 12/07/20   Silverio Decamp, MD  ondansetron (ZOFRAN) 8 MG tablet Take 1 tablet (8 mg total) by mouth 2 (two) times daily as needed (Nausea or vomiting). 06/12/20   [name], MD  oxyCODONE-acetaminophen (PERCOCET) 10-325 MG tablet Take 1 tablet by mouth every 8 (eight) hours  as needed for pain. for pain 02/16/21   Silverio Decamp, MD  potassium chloride SA (KLOR-CON) 20 MEQ tablet TAKE 1 TABLET TWICE DAILY 07/08/20   Silverio Decamp, MD  predniSONE (STERAPRED UNI-PAK 48 TAB) 10 MG (48) TBPK tablet Take by mouth daily. 12-day taper pack, use as directed for taper 12/10/20   Silverio Decamp, MD  prochlorperazine (COMPAZINE) 10 MG tablet Take 1 tablet (10 mg total) by mouth every 6 (six) hours as needed (Nausea or vomiting). 06/12/20   [name], MD  pyridoxine (B-6) 100 MG tablet Take 100 mg by  mouth daily.     [provider]  rivaroxaban (XARELTO) 10 MG TABS tablet Take 1 tablet (10 mg total) by mouth daily with supper. 04/14/20   [name], MD  sacubitril-valsartan (ENTRESTO) 97-103 MG Take 1 tablet by mouth 2 (two) times daily. Patient not taking: Reported on 01/22/2021 07/01/20   [provider]  tamsulosin (FLOMAX) 0.4 MG CAPS capsule Take 1 capsule (0.4 mg total) by mouth daily after supper. 03/11/20   Silverio Decamp, MD  Zoledronic Acid 4 MG SOLR Inject into the vein.    [provider]     Allergies    Budesonide-formoterol fumarate, Codeine, Hydrocodone-acetaminophen, and Mushroom extract complex   Review of Systems   Review of Systems Please see HPI for pertinent positives and negatives  Physical Exam BP (!) 106/51    Pulse 60    Temp (!) 97.5 F (36.4 C) (Oral)    Resp 18    Ht 6' (1.829 m)    Wt 93 kg    SpO2 98%    BMI 27.81 kg/m   Physical Exam Vitals and nursing note reviewed.  Constitutional:      Appearance: Normal appearance.  HENT:     Head: Normocephalic and atraumatic.     Nose: Nose normal.     Mouth/Throat:     Mouth: Mucous membranes are moist.  Eyes:     Extraocular Movements: Extraocular movements intact.     Conjunctiva/sclera: Conjunctivae normal.  Cardiovascular:     Rate and Rhythm: Normal rate.  Pulmonary:     Effort: Pulmonary effort is normal.     Breath sounds: Normal breath sounds.  Abdominal:     General: Abdomen is flat.     Palpations: Abdomen is soft.     Tenderness: There is no abdominal tenderness.  Musculoskeletal:        General: No swelling. Normal range of motion.     Cervical back: Neck supple.  Skin:    General: Skin is warm and dry.  Neurological:     General: No focal deficit present.     Mental Status: He is alert.  Psychiatric:        Mood and Affect: Mood normal.    ED Results / Procedures / Treatments   EKG None  Procedures Procedures  Medications Ordered  in the ED Medications  lactated ringers bolus 1,000 mL (0 mLs Intravenous Stopped 02/18/21 1754)  cefTRIAXone (ROCEPHIN) 1 g in sodium chloride 0.9 % 100 mL IVPB (1 g Intravenous New Bag/Given 02/18/21 1749)    Initial Impression and Plan Patient with history of myeloma, currently getting chemo here with low BP, minimal symptoms. Labs at [facility] not concerning. Will add UA given recent TURP and give a liter of fluids.   ED Course   Clinical Course as of 02/18/21 1824  Thu Feb 18, 2021  1724 BP  is improving with IVF. UA is concerning for UTI given his urinary frequency and recent instrumentation. Will add culture.  [CS]  8264 Patient is feeling better. BP continues to improve. Will plan Rocephin in the ED and Rx for Keflex with outpatient Urology follow up. Encouraged to drink plenty of fluids and RTED for any other concerns.  [CS]    Clinical Course User Index [CS] Truddie Hidden, MD     MDM Rules/Calculators/A&P Medical Decision Making Problems Addressed: Acute cystitis without hematuria: acute illness or injury Hypotension, unspecified hypotension type: acute illness or injury that poses a threat to life or bodily functions  Amount and/or Complexity of Data Reviewed External Data Reviewed: labs. Labs: ordered. Decision-making details documented in ED Course.  Risk Prescription drug management.    Final Clinical Impression(s) / ED Diagnoses Final diagnoses:  Hypotension, unspecified hypotension type  Acute cystitis without hematuria    Rx / DC Orders ED Discharge Orders          Ordered    cephALEXin (KEFLEX) 500 MG capsule  2 times daily        02/18/21 1823             Truddie Hidden, MD 02/18/21 1824

## 2021-02-18 NOTE — Progress Notes (Signed)
Hematology and Oncology Follow Up Visit  [name] [name] [id number] 1940/04/10 81 y.o. 02/18/2021   Principle Diagnosis:  IgG kappa myeloma - Relapsed -- 1q duplication Acute thromboembolism of the right lower leg   Past Therapy:        S/P ASCT at [facility] on 04/02/2015 Patient s/p cycle 5 of Velcade/Revlimid/Decadron Revlimid 10mg  po q day (21/28) -- d/c on 09/15/2017   Current Therapy:     Faspro/Velcade/Decadron -- s/p cycle 6- started 03/20/2020   -- d/c velcade and start Kyprolis on 09/27/2020 -- d/c on 01/29/2021 for CHF Zometa 4 mg IV every 2 months  - next dose due 05/2021 Xarelto 10 mg by mouth daily   Interim History:  [name] [name] is here today for follow-up.  Unfortunately, he has been very busy since we last saw him.  He has congestive heart failure.  I have to believe this is somehow related to the Kyprolis that he had.  As such, going to stop the Kyprolis.  He has had problems with atrial fibrillation.  He recently had a TURP because of urinary difficulties.  This is gotten better.  I think this was right after Christmas.  Over last saw him back in December, his monoclonal spike was not found in his blood.  His Kappa light chain was down to 2.5 mg/dL.  I think that we do have the flexibility to hold on treatment for right now.  I think if we do treat him again, I would consider using Selinexor.  I think this would be a reasonable option for him.  Overall, his back is doing okay.  He does have some problems with neuropathy.  Of note, he had a PET scan done back in mid December.  This did not show any active bony lesions.  Overall, I would say his performance status is probably ECOG 1.    Medications:  Allergies as of 02/18/2021       Reactions   Budesonide-formoterol Fumarate Itching, Other (See Comments)   Codeine Itching   Hydrocodone-acetaminophen Itching   Mushroom Extract Complex Nausea And Vomiting, Other (See Comments)   Only shitake mushroom  causes this  reaction. Flu-like symptoms from portabello mushrooms        Medication List        Accurate as of February 18, 2021 12:17 PM. If you have any questions, ask your nurse or doctor.          STOP taking these medications    benzonatate 200 MG capsule Commonly known as: TESSALON Stopped by: [name], MD       TAKE these medications    cetirizine 10 MG tablet Commonly known as: ZYRTEC TAKE 1 TABLET EVERY DAY   clopidogrel 75 MG tablet Commonly known as: PLAVIX Take 75 mg by mouth daily.   Cyanocobalamin 1000 MCG Tbcr Take 1 tablet by mouth daily.   dexamethasone 4 MG tablet Commonly known as: DECADRON TAKE 5 TABLETS ONCE ON THE DAY OF TREATMENT   empagliflozin 10 MG Tabs tablet Commonly known as: JARDIANCE Take by mouth.   esomeprazole 40 MG capsule Commonly known as: NEXIUM Take 1 capsule (40 mg total) by mouth daily at 12 noon.   famciclovir 500 MG tablet Commonly known as: FAMVIR TAKE 1 TABLET (500 MG TOTAL) BY MOUTH DAILY.   fluticasone 50 MCG/ACT nasal spray Commonly known as: FLONASE Place 2 sprays into both nostrils daily.   furosemide 20 MG tablet Commonly known as: LASIX Take 20 mg by mouth  daily as needed.   gabapentin 600 MG tablet Commonly known as: NEURONTIN Take 1,800 mg by mouth daily.   hydrocortisone 2.5 % ointment Apply topically 2 (two) times daily.   isosorbide mononitrate 60 MG 24 hr tablet Commonly known as: IMDUR Take 60 mg by mouth in the morning and at bedtime.   levothyroxine 100 MCG tablet Commonly known as: Synthroid Take 1 tablet (100 mcg total) by mouth daily before breakfast.   LORazepam 1 MG tablet Commonly known as: ATIVAN Take 1 mg by mouth at bedtime.   LORazepam 1 MG tablet Commonly known as: ATIVAN Take 1 mg by mouth every 6 (six) hours as needed.   metoprolol succinate 50 MG 24 hr tablet Commonly known as: TOPROL-XL Take 50 mg by mouth daily.   nitroGLYCERIN 0.4 MG SL tablet Commonly known  as: NITROSTAT PLACE 1 TABLET UNDER THE TONGUE EVERY 5 (FIVE) MINUTES AS NEEDED FOR CHEST PAIN AS DIRECTED   ondansetron 8 MG tablet Commonly known as: Zofran Take 1 tablet (8 mg total) by mouth 2 (two) times daily as needed (Nausea or vomiting).   oxyCODONE-acetaminophen 10-325 MG tablet Commonly known as: PERCOCET Take 1 tablet by mouth every 8 (eight) hours as needed for pain. for pain   potassium chloride SA 20 MEQ tablet Commonly known as: KLOR-CON M TAKE 1 TABLET TWICE DAILY   predniSONE 10 MG (48) Tbpk tablet Commonly known as: STERAPRED UNI-PAK 48 TAB Take by mouth daily. 12-day taper pack, use as directed for taper   prochlorperazine 10 MG tablet Commonly known as: COMPAZINE Take 1 tablet (10 mg total) by mouth every 6 (six) hours as needed (Nausea or vomiting).   pyridoxine 100 MG tablet Commonly known as: B-6 Take 100 mg by mouth daily.   Repatha SureClick 194 MG/ML Soaj Generic drug: Evolocumab Inject 140 mg into the skin every 14 (fourteen) days.   rivaroxaban 10 MG Tabs tablet Commonly known as: XARELTO Take 1 tablet (10 mg total) by mouth daily with supper.   sacubitril-valsartan 97-103 MG Commonly known as: ENTRESTO Take 1 tablet by mouth 2 (two) times daily.   tamsulosin 0.4 MG Caps capsule Commonly known as: FLOMAX Take 1 capsule (0.4 mg total) by mouth daily after supper.   Zoledronic Acid 4 MG Solr Inject into the vein.        Allergies:  Allergies  Allergen Reactions   Budesonide-Formoterol Fumarate Itching and Other (See Comments)   Codeine Itching   Hydrocodone-Acetaminophen Itching   Mushroom Extract Complex Nausea And Vomiting and Other (See Comments)    Only shitake mushroom  causes this reaction. Flu-like symptoms from portabello mushrooms    Past Medical History, Surgical history, Social history, and Family History were reviewed and updated.  Review of Systems: Review of Systems  Constitutional: Negative.   HENT: Negative.     Eyes: Negative.   Respiratory: Negative.    Cardiovascular:  Positive for palpitations.  Gastrointestinal: Negative.   Genitourinary:  Positive for frequency and urgency.  Musculoskeletal:  Positive for back pain.  Skin: Negative.   Neurological: Negative.   Endo/Heme/Allergies: Negative.   Psychiatric/Behavioral: Negative.      Physical Exam:  weight is 205 lb (93 kg). His oral temperature is 98.2 F (36.8 C). His blood pressure is 70/44 (abnormal) and his pulse is 68. His respiration is 18 and oxygen saturation is 100%.   Wt Readings from Last 3 Encounters:  02/18/21 205 lb (93 kg)  01/22/21 209 lb (94.8 kg)  12/25/20 211  lb (95.7 kg)    Physical Exam Vitals reviewed.  HENT:     Head: Normocephalic and atraumatic.  Eyes:     Pupils: Pupils are equal, round, and reactive to light.  Cardiovascular:     Rate and Rhythm: Normal rate and regular rhythm.     Heart sounds: Normal heart sounds.  Pulmonary:     Effort: Pulmonary effort is normal.     Breath sounds: Normal breath sounds.  Abdominal:     General: Bowel sounds are normal.     Palpations: Abdomen is soft.  Musculoskeletal:        General: No tenderness or deformity. Normal range of motion.     Cervical back: Normal range of motion.  Lymphadenopathy:     Cervical: No cervical adenopathy.  Skin:    General: Skin is warm and dry.     Findings: No erythema or rash.  Neurological:     Mental Status: He is alert and oriented to person, place, and time.  Psychiatric:        Behavior: Behavior normal.        Thought Content: Thought content normal.        Judgment: Judgment normal.    Lab Results  Component Value Date   WBC 7.5 02/18/2021   HGB 11.6 (L) 02/18/2021   HCT 36.4 (L) 02/18/2021   MCV 96.3 02/18/2021   PLT 175 02/18/2021   Lab Results  Component Value Date   FERRITIN 68 09/22/2020   IRON 81 09/22/2020   TIBC 293 09/22/2020   UIBC 211 09/22/2020   IRONPCTSAT 28 09/22/2020   Lab Results   Component Value Date   RETICCTPCT 1.2 02/18/2021   RBC 3.74 (L) 02/18/2021   Lab Results  Component Value Date   KPAFRELGTCHN 24.9 (H) 01/22/2021   LAMBDASER 3.5 (L) 01/22/2021   KAPLAMBRATIO 8.93 01/25/2021   Lab Results  Component Value Date   IGGSERUM 368 (L) 01/22/2021   IGA 9 (L) 01/22/2021   IGMSERUM 10 (L) 01/22/2021   Lab Results  Component Value Date   TOTALPROTELP 5.5 (L) 01/22/2021   ALBUMINELP 3.1 01/22/2021   A1GS 0.3 01/22/2021   A2GS 0.9 01/22/2021   BETS 0.9 01/22/2021   BETA2SER 0.3 02/11/2015   GAMS 0.3 (L) 01/22/2021   MSPIKE Not Observed 01/22/2021   SPEI Comment 09/22/2020     Chemistry      Component Value Date/Time   NA 139 02/18/2021 1109   NA 145 (H) 06/28/2019 1501   NA 140 01/27/2017 1044   NA 141 06/05/2015 1036   K 4.4 02/18/2021 1109   K 3.3 01/27/2017 1044   K 2.9 (LL) 06/05/2015 1036   CL 103 02/18/2021 1109   CL 101 01/27/2017 1044   CO2 26 02/18/2021 1109   CO2 28 01/27/2017 1044   CO2 27 06/05/2015 1036   [name] 21 02/18/2021 1109   [name] 15 06/28/2019 1501   [name] 14 01/27/2017 1044   [name] 13.1 06/05/2015 1036   CREATININE 1.58 (H) 02/18/2021 1109   CREATININE 2.49 (H) 11/25/2020 0000   CREATININE 1.0 06/05/2015 1036   GLU 152 03/30/2016 0000      Component Value Date/Time   CALCIUM 9.7 02/18/2021 1109   CALCIUM 8.9 01/27/2017 1044   CALCIUM 9.4 06/05/2015 1036   ALKPHOS 107 02/18/2021 1109   ALKPHOS 126 (H) 01/27/2017 1044   ALKPHOS 94 06/05/2015 1036   AST 19 02/18/2021 1109   AST 22 06/05/2015 1036   ALT  17 02/18/2021 1109   ALT 33 01/27/2017 1044   ALT 18 06/05/2015 1036   BILITOT 0.5 02/18/2021 1109   BILITOT 0.59 06/05/2015 1036       Impression and Plan: [name] [name] is a very pleasant 81 yo caucasian gentleman with history of IgG kappa myeloma. He underwent standard induction chemotherapy and then ultimately stem cell transplant at [facility] in February 2017.   He had recurrence with kappa light chain disease.  He  has been on Kyprolis.  So far, he is doing well with Kyprolis but I think his had some cardiac issues.  For now, we will just watch him.  I think we have the luxury of being able to watch since his blood counts and his myeloma levels are doing so well.  Again he will get his Zometa today.  I was like to have him come back in a month.  Hopefully, he will have a better cardiac function.  [name], MD 1/5/202312:17 PM

## 2021-02-19 ENCOUNTER — Encounter: Payer: Self-pay | Admitting: Hematology & Oncology

## 2021-02-19 ENCOUNTER — Telehealth: Payer: Self-pay | Admitting: *Deleted

## 2021-02-19 LAB — KAPPA/LAMBDA LIGHT CHAINS
Kappa free light chain: 41.8 mg/L — ABNORMAL HIGH (ref 3.3–19.4)
Kappa, lambda light chain ratio: 5.73 — ABNORMAL HIGH (ref 0.26–1.65)
Lambda free light chains: 7.3 mg/L (ref 5.7–26.3)

## 2021-02-19 LAB — IGG, IGA, IGM
IgA: 9 mg/dL — ABNORMAL LOW (ref 61–437)
IgG (Immunoglobin G), Serum: 437 mg/dL — ABNORMAL LOW (ref 603–1613)
IgM (Immunoglobulin M), Srm: 20 mg/dL (ref 15–143)

## 2021-02-19 LAB — IRON AND IRON BINDING CAPACITY (CC-WL,HP ONLY)
Iron: 39 ug/dL — ABNORMAL LOW (ref 45–182)
Saturation Ratios: 13 % — ABNORMAL LOW (ref 17.9–39.5)
TIBC: 307 ug/dL (ref 250–450)
UIBC: 268 ug/dL (ref 117–376)

## 2021-02-19 LAB — FERRITIN: Ferritin: 221 ng/mL (ref 24–336)

## 2021-02-19 NOTE — Telephone Encounter (Signed)
As noted below by Dr. Marin Olp, I informed the patient that his iron level is low. This might be causing some weakness and fatigue. Dr. Marin Olp would like for you to get a dose of IV iron. This message was also left on his cell phone. Instructed the patient to call the office and let us know if he is still taking an oral iron supplement.

## 2021-02-19 NOTE — Telephone Encounter (Signed)
-----   Message from [name], MD sent at 02/19/2021  2:52 PM EST ----- Call - the light chain is up a little, but I think we are still in good shape!!!  [name] [name]

## 2021-02-19 NOTE — Addendum Note (Signed)
Addended by: Burney Gauze R on: 02/19/2021 04:25 PM   Modules accepted: Orders

## 2021-02-19 NOTE — Telephone Encounter (Signed)
-----   Message from [name], MD sent at 02/19/2021 11:39 AM EST ----- Please call and let him know that the iron level is low.  This might be causing some of the weakness and fatigue.  He needs a dose of IV iron.

## 2021-02-19 NOTE — Telephone Encounter (Signed)
-----   Message from [name], MD sent at 02/19/2021  2:52 PM EST ----- Call - the light chain is up a little, but I think we are still in good shape!!!  [name]

## 2021-02-19 NOTE — Telephone Encounter (Signed)
As noted below by Dr. Marin Olp, I informed the patient that the light chain is up a little, but he thinks we are still in good shape!. Also, patient stated he would like to try an IV iron transfusion. Instructed him that I would have scheduling call him to arrange the appointment. He verbalized understanding.

## 2021-02-21 LAB — URINE CULTURE: Culture: >=100000 — AB

## 2021-02-22 ENCOUNTER — Telehealth: Payer: Self-pay | Admitting: *Deleted

## 2021-02-22 LAB — PROTEIN ELECTROPHORESIS, SERUM
A/G Ratio: 1.4 (ref 0.7–1.7)
Albumin ELP: 3.4 g/dL (ref 2.9–4.4)
Alpha-1-Globulin: 0.2 g/dL (ref 0.0–0.4)
Alpha-2-Globulin: 0.9 g/dL (ref 0.4–1.0)
Beta Globulin: 0.8 g/dL (ref 0.7–1.3)
Gamma Globulin: 0.4 g/dL (ref 0.4–1.8)
Globulin, Total: 2.4 g/dL (ref 2.2–3.9)
Total Protein ELP: 5.8 g/dL — ABNORMAL LOW (ref 6.0–8.5)

## 2021-02-22 NOTE — Progress Notes (Signed)
ED Antimicrobial Stewardship Positive Culture Follow Up   [name] [name] is an 80 y.o. male who presented to [facility] on 02/18/2021 with a chief complaint of  Chief Complaint  Patient presents with   Hypotension    Recent Results (from the past 720 hour(s))  Urine Culture     Status: Abnormal   Collection Time: 02/18/21  4:28 PM   Specimen: Urine, Clean Catch  Result Value Ref Range Status   Specimen Description   Final    URINE, CLEAN CATCH Performed at [facility], [address] [place], [place] [address]    Special Requests   Final    NONE Performed at [facility], [address] [place], [place] [address]    Culture >=100,000 COLONIES/mL ENTEROCOCCUS FAECALIS (A)  Final   Report Status 02/21/2021 FINAL  Final   Organism ID, Bacteria ENTEROCOCCUS FAECALIS (A)  Final      Susceptibility   Enterococcus faecalis - MIC*    AMPICILLIN <=2 SENSITIVE Sensitive     NITROFURANTOIN <=16 SENSITIVE Sensitive     VANCOMYCIN 1 SENSITIVE Sensitive     * >=100,000 COLONIES/mL ENTEROCOCCUS FAECALIS    [x]  Treated with cephalexin 500 mg PO BID x 7 days, organism resistant to prescribed antimicrobial  New antibiotic prescription: amoxicillin 500 mg PO TID x 7 days   ED Provider: Isla Pence, MD    Elsie Amis, PharmD Student  02/22/2021, 9:14 AM

## 2021-02-22 NOTE — Telephone Encounter (Signed)
Post ED Visit - Positive Culture Follow-up: Successful Patient Follow-Up  Culture assessed and recommendations reviewed by:  []  Elenor Quinones, Pharm.D. [x]  Heide Guile, Pharm.D., BCPS AQ-ID []  Parks Neptune, Pharm.D., BCPS []  Alycia Rossetti, Pharm.D., BCPS []  [place], [place].D., BCPS, AAHIVP []  Legrand Como, Pharm.D., BCPS, AAHIVP []  Salome Arnt, PharmD, BCPS []  Johnnette Gourd, PharmD, BCPS []  Hughes Better, PharmD, BCPS []  Leeroy Cha, PharmD  Positive urine culture  []  Patient discharged without antimicrobial prescription and treatment is now indicated [x]  Organism is resistant to prescribed ED discharge antimicrobial []  Patient with positive blood cultures  Changes discussed with ED provider: Isla Pence, MD New antibiotic prescription Amoxicillin 500mg  TID x 7 days Called to [facility] [phone]  Contacted patient, date 02/22/2021, time [place] 02/22/2021, 10:00 AM

## 2021-02-24 ENCOUNTER — Inpatient Hospital Stay: Payer: Medicare Other

## 2021-02-24 ENCOUNTER — Other Ambulatory Visit: Payer: Self-pay

## 2021-02-24 VITALS — BP 122/33 | HR 59 | Resp 18

## 2021-02-24 DIAGNOSIS — N138 Other obstructive and reflux uropathy: Secondary | ICD-10-CM | POA: Diagnosis not present

## 2021-02-24 DIAGNOSIS — M899 Disorder of bone, unspecified: Secondary | ICD-10-CM

## 2021-02-24 DIAGNOSIS — D509 Iron deficiency anemia, unspecified: Secondary | ICD-10-CM | POA: Diagnosis not present

## 2021-02-24 DIAGNOSIS — D500 Iron deficiency anemia secondary to blood loss (chronic): Secondary | ICD-10-CM

## 2021-02-24 DIAGNOSIS — C9002 Multiple myeloma in relapse: Secondary | ICD-10-CM | POA: Diagnosis not present

## 2021-02-24 DIAGNOSIS — C9000 Multiple myeloma not having achieved remission: Secondary | ICD-10-CM

## 2021-02-24 DIAGNOSIS — N401 Enlarged prostate with lower urinary tract symptoms: Secondary | ICD-10-CM | POA: Diagnosis not present

## 2021-02-24 MED ORDER — SODIUM CHLORIDE 0.9 % IV SOLN: Freq: Once | INTRAVENOUS | Status: AC

## 2021-02-24 MED ORDER — SODIUM CHLORIDE 0.9 % IV SOLN
1000.0000 mg | Freq: Once | INTRAVENOUS | Status: AC
  Administered 2021-02-24: 1000 mg via INTRAVENOUS
  Filled 2021-02-24: qty 10

## 2021-02-24 NOTE — Patient Instructions (Signed)

## 2021-02-25 ENCOUNTER — Telehealth: Payer: Self-pay

## 2021-02-25 NOTE — Telephone Encounter (Signed)
Patient called stating he is being billed for the [facility] on 11-26-2020 due to it being coded as a physical. He is adamant that he did not have a physical and would like the code changed so he can have this resubmitted to medicare (who does not pay for physicals) to hopefully pay for this visit.

## 2021-02-26 NOTE — Telephone Encounter (Signed)
It was not coded as a physical, it was coded as a [place] office visit.  I have changed the diagnosis linked to it to only represent the skin lesion, that could help, I will include Abigail Butts and Deirdre on this.

## 2021-03-02 DIAGNOSIS — I428 Other cardiomyopathies: Secondary | ICD-10-CM | POA: Diagnosis not present

## 2021-03-02 DIAGNOSIS — J449 Chronic obstructive pulmonary disease, unspecified: Secondary | ICD-10-CM | POA: Diagnosis not present

## 2021-03-02 DIAGNOSIS — I5020 Unspecified systolic (congestive) heart failure: Secondary | ICD-10-CM | POA: Diagnosis not present

## 2021-03-02 DIAGNOSIS — G4733 Obstructive sleep apnea (adult) (pediatric): Secondary | ICD-10-CM | POA: Diagnosis not present

## 2021-03-02 DIAGNOSIS — Z9989 Dependence on other enabling machines and devices: Secondary | ICD-10-CM | POA: Diagnosis not present

## 2021-03-10 DIAGNOSIS — N138 Other obstructive and reflux uropathy: Secondary | ICD-10-CM | POA: Diagnosis not present

## 2021-03-10 DIAGNOSIS — N401 Enlarged prostate with lower urinary tract symptoms: Secondary | ICD-10-CM | POA: Diagnosis not present

## 2021-03-11 ENCOUNTER — Other Ambulatory Visit: Payer: Self-pay | Admitting: Sports Medicine

## 2021-03-23 DIAGNOSIS — I5189 Other ill-defined heart diseases: Secondary | ICD-10-CM | POA: Diagnosis not present

## 2021-03-23 DIAGNOSIS — I358 Other nonrheumatic aortic valve disorders: Secondary | ICD-10-CM | POA: Diagnosis not present

## 2021-03-23 DIAGNOSIS — I517 Cardiomegaly: Secondary | ICD-10-CM | POA: Diagnosis not present

## 2021-03-23 DIAGNOSIS — I351 Nonrheumatic aortic (valve) insufficiency: Secondary | ICD-10-CM | POA: Diagnosis not present

## 2021-03-23 DIAGNOSIS — I519 Heart disease, unspecified: Secondary | ICD-10-CM | POA: Diagnosis not present

## 2021-03-25 ENCOUNTER — Inpatient Hospital Stay: Payer: Medicare Other | Attending: Hematology & Oncology

## 2021-03-25 ENCOUNTER — Inpatient Hospital Stay (HOSPITAL_BASED_OUTPATIENT_CLINIC_OR_DEPARTMENT_OTHER): Payer: Medicare Other | Admitting: Hematology & Oncology

## 2021-03-25 ENCOUNTER — Other Ambulatory Visit: Payer: Self-pay

## 2021-03-25 ENCOUNTER — Encounter: Payer: Self-pay | Admitting: Hematology & Oncology

## 2021-03-25 VITALS — BP 106/58 | HR 58 | Temp 98.1°F | Resp 18 | Ht 72.05 in | Wt 207.4 lb

## 2021-03-25 DIAGNOSIS — N189 Chronic kidney disease, unspecified: Secondary | ICD-10-CM | POA: Diagnosis not present

## 2021-03-25 DIAGNOSIS — Z79899 Other long term (current) drug therapy: Secondary | ICD-10-CM | POA: Diagnosis not present

## 2021-03-25 DIAGNOSIS — Z7901 Long term (current) use of anticoagulants: Secondary | ICD-10-CM | POA: Diagnosis not present

## 2021-03-25 DIAGNOSIS — D509 Iron deficiency anemia, unspecified: Secondary | ICD-10-CM | POA: Diagnosis not present

## 2021-03-25 DIAGNOSIS — Z86718 Personal history of other venous thrombosis and embolism: Secondary | ICD-10-CM | POA: Diagnosis not present

## 2021-03-25 DIAGNOSIS — C9002 Multiple myeloma in relapse: Secondary | ICD-10-CM | POA: Insufficient documentation

## 2021-03-25 DIAGNOSIS — Z9221 Personal history of antineoplastic chemotherapy: Secondary | ICD-10-CM | POA: Diagnosis not present

## 2021-03-25 DIAGNOSIS — C9000 Multiple myeloma not having achieved remission: Secondary | ICD-10-CM

## 2021-03-25 DIAGNOSIS — N289 Disorder of kidney and ureter, unspecified: Secondary | ICD-10-CM | POA: Diagnosis not present

## 2021-03-25 LAB — CBC WITH DIFFERENTIAL (CANCER CENTER ONLY)
Abs Immature Granulocytes: 0.01 10*3/uL (ref 0.00–0.07)
Basophils Absolute: 0 10*3/uL (ref 0.0–0.1)
Basophils Relative: 1 %
Eosinophils Absolute: 0.1 10*3/uL (ref 0.0–0.5)
Eosinophils Relative: 2 %
HCT: 36.4 % — ABNORMAL LOW (ref 39.0–52.0)
Hemoglobin: 11.6 g/dL — ABNORMAL LOW (ref 13.0–17.0)
Immature Granulocytes: 0 %
Lymphocytes Relative: 26 %
Lymphs Abs: 1.3 10*3/uL (ref 0.7–4.0)
MCH: 30.5 pg (ref 26.0–34.0)
MCHC: 31.9 g/dL (ref 30.0–36.0)
MCV: 95.8 fL (ref 80.0–100.0)
Monocytes Absolute: 0.5 10*3/uL (ref 0.1–1.0)
Monocytes Relative: 11 %
Neutro Abs: 3.1 10*3/uL (ref 1.7–7.7)
Neutrophils Relative %: 60 %
Platelet Count: 166 10*3/uL (ref 150–400)
RBC: 3.8 MIL/uL — ABNORMAL LOW (ref 4.22–5.81)
RDW: 14.7 % (ref 11.5–15.5)
WBC Count: 5.1 10*3/uL (ref 4.0–10.5)
nRBC: 0 % (ref 0.0–0.2)

## 2021-03-25 LAB — CMP (CANCER CENTER ONLY)
ALT: 20 U/L (ref 0–44)
AST: 21 U/L (ref 15–41)
Albumin: 3.8 g/dL (ref 3.5–5.0)
Alkaline Phosphatase: 129 U/L — ABNORMAL HIGH (ref 38–126)
Anion gap: 9 (ref 5–15)
BUN: 21 mg/dL (ref 8–23)
CO2: 25 mmol/L (ref 22–32)
Calcium: 8.6 mg/dL — ABNORMAL LOW (ref 8.9–10.3)
Chloride: 104 mmol/L (ref 98–111)
Creatinine: 1.38 mg/dL — ABNORMAL HIGH (ref 0.61–1.24)
GFR, Estimated: 52 mL/min — ABNORMAL LOW (ref >60)
Glucose, Bld: 87 mg/dL (ref 70–99)
Potassium: 4.1 mmol/L (ref 3.5–5.1)
Sodium: 138 mmol/L (ref 135–145)
Total Bilirubin: 0.5 mg/dL (ref 0.3–1.2)
Total Protein: 6.3 g/dL — ABNORMAL LOW (ref 6.5–8.1)

## 2021-03-25 LAB — LACTATE DEHYDROGENASE: LDH: 138 U/L (ref 98–192)

## 2021-03-25 NOTE — Progress Notes (Signed)
Hematology and Oncology Follow Up Visit  [name] [name] [id number] 11-Jun-1940 81 y.o. 03/25/2021   Principle Diagnosis:  IgG kappa myeloma - Relapsed -- 1q duplication Acute thromboembolism of the right lower leg Iron deficiency anemia   Past Therapy:        S/P ASCT at [facility] on 04/02/2015 Patient s/p cycle 5 of Velcade/Revlimid/Decadron Revlimid 10mg  po q day (21/28) -- d/c on 09/15/2017   Current Therapy:     Faspro/Velcade/Decadron -- s/p cycle 6- started 03/20/2020   -- d/c velcade and start Kyprolis on 09/27/2020 -- d/c on 01/29/2021 for CHF Zometa 4 mg IV every 2 months  - next dose due 05/2021 Xarelto 10 mg by mouth daily IV iron-Monoferric given on 02/24/2021.   Interim History:  [name] [name] is here today for follow-up.  Thankfully, his cardiac status is getting better.  He said he just had an echocardiogram that was done which showed an ejection fraction of 45%.  He feels tired still.  We did have to give him some IV iron when we last saw him.  His iron saturation was only 13%.  He got Monoferric.  He has had no issues with respect to nausea or vomiting.  He has had no change in bowel or bladder habits.  The last time that we saw him, there was no monoclonal spike in his blood.  His IgG level was 437 mg/dL.  The Kappa light chain was 4.2 mg/dL.  He has not complained of any pain.  There is no fever.  He has had no leg swelling.  He did have the TURP back in December.  He is urinating pretty well right now.  Overall, I would say his performance status is probably ECOG 1.     Medications:  Allergies as of 03/25/2021       Reactions   Budesonide-formoterol Fumarate Itching, Other (See Comments)   Codeine Itching   Hydrocodone-acetaminophen Itching   Mushroom Extract Complex Nausea And Vomiting, Other (See Comments)   Only shitake mushroom  causes this reaction. Flu-like symptoms from portabello mushrooms        Medication List        Accurate as of March 25, 2021 12:40 PM. If you have any questions, ask your nurse or doctor.          STOP taking these medications    isosorbide mononitrate 60 MG 24 hr tablet Commonly known as: IMDUR Stopped by: [name], MD   predniSONE 10 MG (48) Tbpk tablet Commonly known as: STERAPRED UNI-PAK 53 TAB Stopped by: [name], MD       TAKE these medications    cetirizine 10 MG tablet Commonly known as: ZYRTEC TAKE 1 TABLET EVERY DAY   clopidogrel 75 MG tablet Commonly known as: PLAVIX Take 75 mg by mouth daily.   Cyanocobalamin 1000 MCG Tbcr Take 1 tablet by mouth daily.   dexamethasone 4 MG tablet Commonly known as: DECADRON TAKE 5 TABLETS ONCE ON THE DAY OF TREATMENT   empagliflozin 10 MG Tabs tablet Commonly known as: JARDIANCE Take by mouth.   esomeprazole 40 MG capsule Commonly known as: NEXIUM Take 1 capsule (40 mg total) by mouth daily at 12 noon.   famciclovir 500 MG tablet Commonly known as: FAMVIR TAKE 1 TABLET (500 MG TOTAL) BY MOUTH DAILY.   fluticasone 50 MCG/ACT nasal spray Commonly known as: FLONASE Place 2 sprays into both nostrils daily.   furosemide 20 MG tablet Commonly known as: LASIX Take  20 mg by mouth daily as needed.   gabapentin 600 MG tablet Commonly known as: NEURONTIN Take 1,800 mg by mouth daily.   hydrocortisone 2.5 % ointment Apply topically 2 (two) times daily.   levothyroxine 100 MCG tablet Commonly known as: SYNTHROID TAKE 1 TABLET EVERY DAY BEFORE BREAKFAST   LORazepam 1 MG tablet Commonly known as: ATIVAN Take 1 mg by mouth at bedtime. What changed: Another medication with the same name was removed. Continue taking this medication, and follow the directions you see here. Changed by: [name], MD   metoprolol succinate 50 MG 24 hr tablet Commonly known as: TOPROL-XL Take 50 mg by mouth daily.   nitroGLYCERIN 0.4 MG SL tablet Commonly known as: NITROSTAT PLACE 1 TABLET UNDER THE TONGUE EVERY 5 (FIVE) MINUTES  AS NEEDED FOR CHEST PAIN AS DIRECTED   ondansetron 8 MG tablet Commonly known as: Zofran Take 1 tablet (8 mg total) by mouth 2 (two) times daily as needed (Nausea or vomiting).   oxyCODONE-acetaminophen 10-325 MG tablet Commonly known as: PERCOCET Take 1 tablet by mouth every 8 (eight) hours as needed for pain. for pain   potassium chloride SA 20 MEQ tablet Commonly known as: KLOR-CON M TAKE 1 TABLET TWICE DAILY   prochlorperazine 10 MG tablet Commonly known as: COMPAZINE Take 1 tablet (10 mg total) by mouth every 6 (six) hours as needed (Nausea or vomiting).   pyridoxine 100 MG tablet Commonly known as: B-6 Take 100 mg by mouth daily.   Repatha SureClick 836 MG/ML Soaj Generic drug: Evolocumab Inject 140 mg into the skin every 14 (fourteen) days.   rivaroxaban 10 MG Tabs tablet Commonly known as: XARELTO Take 1 tablet (10 mg total) by mouth daily with supper.   sacubitril-valsartan 97-103 MG Commonly known as: ENTRESTO Take 1 tablet by mouth 2 (two) times daily.   tamsulosin 0.4 MG Caps capsule Commonly known as: FLOMAX Take 1 capsule (0.4 mg total) by mouth daily after supper.   Zoledronic Acid 4 MG Solr Inject into the vein.        Allergies:  Allergies  Allergen Reactions   Budesonide-Formoterol Fumarate Itching and Other (See Comments)   Codeine Itching   Hydrocodone-Acetaminophen Itching   Mushroom Extract Complex Nausea And Vomiting and Other (See Comments)    Only shitake mushroom  causes this reaction. Flu-like symptoms from portabello mushrooms    Past Medical History, Surgical history, Social history, and Family History were reviewed and updated.  Review of Systems: Review of Systems  Constitutional: Negative.   HENT: Negative.    Eyes: Negative.   Respiratory: Negative.    Cardiovascular:  Positive for palpitations.  Gastrointestinal: Negative.   Genitourinary:  Positive for frequency and urgency.  Musculoskeletal:  Positive for back  pain.  Skin: Negative.   Neurological: Negative.   Endo/Heme/Allergies: Negative.   Psychiatric/Behavioral: Negative.      Physical Exam:  height is 6' 0.05" (1.83 m) and weight is 207 lb 6.4 oz (94.1 kg). His oral temperature is 98.1 F (36.7 C). His blood pressure is 106/58 (abnormal) and his pulse is 58 (abnormal). His respiration is 18 and oxygen saturation is 98%.   Wt Readings from Last 3 Encounters:  03/25/21 207 lb 6.4 oz (94.1 kg)  02/18/21 205 lb 0.4 oz (93 kg)  02/18/21 205 lb (93 kg)    Physical Exam Vitals reviewed.  HENT:     Head: Normocephalic and atraumatic.  Eyes:     Pupils: Pupils are equal, round,  and reactive to light.  Cardiovascular:     Rate and Rhythm: Normal rate and regular rhythm.     Heart sounds: Normal heart sounds.  Pulmonary:     Effort: Pulmonary effort is normal.     Breath sounds: Normal breath sounds.  Abdominal:     General: Bowel sounds are normal.     Palpations: Abdomen is soft.  Musculoskeletal:        General: No tenderness or deformity. Normal range of motion.     Cervical back: Normal range of motion.  Lymphadenopathy:     Cervical: No cervical adenopathy.  Skin:    General: Skin is warm and dry.     Findings: No erythema or rash.  Neurological:     Mental Status: He is alert and oriented to person, place, and time.  Psychiatric:        Behavior: Behavior normal.        Thought Content: Thought content normal.        Judgment: Judgment normal.    Lab Results  Component Value Date   WBC 5.1 03/25/2021   HGB 11.6 (L) 03/25/2021   HCT 36.4 (L) 03/25/2021   MCV 95.8 03/25/2021   PLT 166 03/25/2021   Lab Results  Component Value Date   FERRITIN 221 02/18/2021   IRON 39 (L) 02/18/2021   TIBC 307 02/18/2021   UIBC 268 02/18/2021   IRONPCTSAT 13 (L) 02/18/2021   Lab Results  Component Value Date   RETICCTPCT 1.2 02/18/2021   RBC 3.80 (L) 03/25/2021   Lab Results  Component Value Date   KPAFRELGTCHN 41.8 (H)  02/18/2021   LAMBDASER 7.3 02/18/2021   KAPLAMBRATIO 5.73 (H) 02/18/2021   Lab Results  Component Value Date   IGGSERUM 437 (L) 02/18/2021   IGA 9 (L) 02/18/2021   IGMSERUM 20 02/18/2021   Lab Results  Component Value Date   TOTALPROTELP 5.8 (L) 02/18/2021   ALBUMINELP 3.4 02/18/2021   A1GS 0.2 02/18/2021   A2GS 0.9 02/18/2021   BETS 0.8 02/18/2021   BETA2SER 0.3 02/11/2015   GAMS 0.4 02/18/2021   MSPIKE Not Observed 02/18/2021   SPEI Comment 02/18/2021     Chemistry      Component Value Date/Time   NA 138 03/25/2021 1115   NA 145 (H) 06/28/2019 1501   NA 140 01/27/2017 1044   NA 141 06/05/2015 1036   K 4.1 03/25/2021 1115   K 3.3 01/27/2017 1044   K 2.9 (LL) 06/05/2015 1036   CL 104 03/25/2021 1115   CL 101 01/27/2017 1044   CO2 25 03/25/2021 1115   CO2 28 01/27/2017 1044   CO2 27 06/05/2015 1036   BUN 21 03/25/2021 1115   BUN 15 06/28/2019 1501   BUN 14 01/27/2017 1044   BUN 13.1 06/05/2015 1036   CREATININE 1.38 (H) 03/25/2021 1115   CREATININE 2.49 (H) 11/25/2020 0000   CREATININE 1.0 06/05/2015 1036   GLU 152 03/30/2016 0000      Component Value Date/Time   CALCIUM 8.6 (L) 03/25/2021 1115   CALCIUM 8.9 01/27/2017 1044   CALCIUM 9.4 06/05/2015 1036   ALKPHOS 129 (H) 03/25/2021 1115   ALKPHOS 126 (H) 01/27/2017 1044   ALKPHOS 94 06/05/2015 1036   AST 21 03/25/2021 1115   AST 22 06/05/2015 1036   ALT 20 03/25/2021 1115   ALT 33 01/27/2017 1044   ALT 18 06/05/2015 1036   BILITOT 0.5 03/25/2021 1115   BILITOT 0.59 06/05/2015 1036  Impression and Plan: Mr. Schweer is a very pleasant 81 yo caucasian gentleman with history of IgG kappa myeloma. He underwent standard induction chemotherapy and then ultimately stem cell transplant at [facility] in February 2017.   He had recurrence with kappa light chain disease.  He hadbeen on Kyprolis.  He apparently had some cardiac issues.  I suspect that the Kyprolis probably was a contributing factor.  As such, we do  not have him on anything right now.    I think, if we do have to treat him, I would probably consider Selinexor.  I think this would be a reasonable option for him.  Hopefully, we do not have to think about doing any treatment for quite a while.  We will plan to get him back to see [place] in another 6 weeks.  I think this would be reasonable for follow-up.     [name], MD 2/9/202312:40 PM

## 2021-03-26 LAB — KAPPA/LAMBDA LIGHT CHAINS
Kappa free light chain: 63.2 mg/L — ABNORMAL HIGH (ref 3.3–19.4)
Kappa, lambda light chain ratio: 10.9 — ABNORMAL HIGH (ref 0.26–1.65)
Lambda free light chains: 5.8 mg/L (ref 5.7–26.3)

## 2021-03-26 LAB — IGG, IGA, IGM
IgA: 16 mg/dL — ABNORMAL LOW (ref 61–437)
IgG (Immunoglobin G), Serum: 455 mg/dL — ABNORMAL LOW (ref 603–1613)
IgM (Immunoglobulin M), Srm: 14 mg/dL — ABNORMAL LOW (ref 15–143)

## 2021-03-29 LAB — PROTEIN ELECTROPHORESIS, SERUM, WITH REFLEX
A/G Ratio: 2 — ABNORMAL HIGH (ref 0.7–1.7)
Albumin ELP: 3.8 g/dL (ref 2.9–4.4)
Alpha-1-Globulin: 0.2 g/dL (ref 0.0–0.4)
Alpha-2-Globulin: 0.7 g/dL (ref 0.4–1.0)
Beta Globulin: 0.7 g/dL (ref 0.7–1.3)
Gamma Globulin: 0.3 g/dL — ABNORMAL LOW (ref 0.4–1.8)
Globulin, Total: 1.9 g/dL — ABNORMAL LOW (ref 2.2–3.9)
Total Protein ELP: 5.7 g/dL — ABNORMAL LOW (ref 6.0–8.5)

## 2021-04-02 DIAGNOSIS — N138 Other obstructive and reflux uropathy: Secondary | ICD-10-CM | POA: Diagnosis not present

## 2021-04-02 DIAGNOSIS — N471 Phimosis: Secondary | ICD-10-CM | POA: Diagnosis not present

## 2021-04-02 DIAGNOSIS — N401 Enlarged prostate with lower urinary tract symptoms: Secondary | ICD-10-CM | POA: Diagnosis not present

## 2021-04-07 DIAGNOSIS — Z87891 Personal history of nicotine dependence: Secondary | ICD-10-CM | POA: Diagnosis not present

## 2021-04-07 DIAGNOSIS — R918 Other nonspecific abnormal finding of lung field: Secondary | ICD-10-CM | POA: Diagnosis not present

## 2021-04-07 DIAGNOSIS — Z9989 Dependence on other enabling machines and devices: Secondary | ICD-10-CM | POA: Diagnosis not present

## 2021-04-07 DIAGNOSIS — J4530 Mild persistent asthma, uncomplicated: Secondary | ICD-10-CM | POA: Diagnosis not present

## 2021-04-07 DIAGNOSIS — Z86711 Personal history of pulmonary embolism: Secondary | ICD-10-CM | POA: Diagnosis not present

## 2021-04-07 DIAGNOSIS — G4733 Obstructive sleep apnea (adult) (pediatric): Secondary | ICD-10-CM | POA: Diagnosis not present

## 2021-04-13 ENCOUNTER — Other Ambulatory Visit: Payer: Self-pay | Admitting: Sports Medicine

## 2021-04-13 DIAGNOSIS — I2510 Atherosclerotic heart disease of native coronary artery without angina pectoris: Secondary | ICD-10-CM | POA: Diagnosis not present

## 2021-04-13 DIAGNOSIS — M47815 Spondylosis without myelopathy or radiculopathy, thoracolumbar region: Secondary | ICD-10-CM

## 2021-04-13 DIAGNOSIS — I429 Cardiomyopathy, unspecified: Secondary | ICD-10-CM | POA: Diagnosis not present

## 2021-04-13 DIAGNOSIS — I5020 Unspecified systolic (congestive) heart failure: Secondary | ICD-10-CM | POA: Diagnosis not present

## 2021-04-13 MED ORDER — OXYCODONE-ACETAMINOPHEN 10-325 MG PO TABS: 1.0000 | ORAL_TABLET | Freq: Three times a day (TID) | ORAL | 0 refills | Status: DC | PRN

## 2021-04-17 DIAGNOSIS — J439 Emphysema, unspecified: Secondary | ICD-10-CM | POA: Diagnosis not present

## 2021-04-17 DIAGNOSIS — J479 Bronchiectasis, uncomplicated: Secondary | ICD-10-CM | POA: Diagnosis not present

## 2021-04-17 DIAGNOSIS — R918 Other nonspecific abnormal finding of lung field: Secondary | ICD-10-CM | POA: Diagnosis not present

## 2021-04-17 DIAGNOSIS — J984 Other disorders of lung: Secondary | ICD-10-CM | POA: Diagnosis not present

## 2021-04-17 DIAGNOSIS — J929 Pleural plaque without asbestos: Secondary | ICD-10-CM | POA: Diagnosis not present

## 2021-04-19 DIAGNOSIS — R82998 Other abnormal findings in urine: Secondary | ICD-10-CM | POA: Diagnosis not present

## 2021-04-26 DIAGNOSIS — D492 Neoplasm of unspecified behavior of bone, soft tissue, and skin: Secondary | ICD-10-CM | POA: Diagnosis not present

## 2021-04-26 DIAGNOSIS — Z872 Personal history of diseases of the skin and subcutaneous tissue: Secondary | ICD-10-CM | POA: Diagnosis not present

## 2021-04-26 DIAGNOSIS — Z87891 Personal history of nicotine dependence: Secondary | ICD-10-CM | POA: Diagnosis not present

## 2021-04-26 DIAGNOSIS — D229 Melanocytic nevi, unspecified: Secondary | ICD-10-CM | POA: Diagnosis not present

## 2021-04-26 DIAGNOSIS — Z9989 Dependence on other enabling machines and devices: Secondary | ICD-10-CM | POA: Diagnosis not present

## 2021-04-26 DIAGNOSIS — Z86711 Personal history of pulmonary embolism: Secondary | ICD-10-CM | POA: Diagnosis not present

## 2021-04-26 DIAGNOSIS — L570 Actinic keratosis: Secondary | ICD-10-CM | POA: Diagnosis not present

## 2021-04-26 DIAGNOSIS — G4733 Obstructive sleep apnea (adult) (pediatric): Secondary | ICD-10-CM | POA: Diagnosis not present

## 2021-04-26 DIAGNOSIS — J479 Bronchiectasis, uncomplicated: Secondary | ICD-10-CM | POA: Diagnosis not present

## 2021-04-26 DIAGNOSIS — Z85828 Personal history of other malignant neoplasm of skin: Secondary | ICD-10-CM | POA: Diagnosis not present

## 2021-04-26 DIAGNOSIS — R918 Other nonspecific abnormal finding of lung field: Secondary | ICD-10-CM | POA: Diagnosis not present

## 2021-04-26 DIAGNOSIS — L219 Seborrheic dermatitis, unspecified: Secondary | ICD-10-CM | POA: Diagnosis not present

## 2021-04-26 DIAGNOSIS — L578 Other skin changes due to chronic exposure to nonionizing radiation: Secondary | ICD-10-CM | POA: Diagnosis not present

## 2021-04-28 DIAGNOSIS — Z91018 Allergy to other foods: Secondary | ICD-10-CM | POA: Diagnosis not present

## 2021-04-28 DIAGNOSIS — Z79890 Hormone replacement therapy (postmenopausal): Secondary | ICD-10-CM | POA: Diagnosis not present

## 2021-04-28 DIAGNOSIS — Z7984 Long term (current) use of oral hypoglycemic drugs: Secondary | ICD-10-CM | POA: Diagnosis not present

## 2021-04-28 DIAGNOSIS — Z7902 Long term (current) use of antithrombotics/antiplatelets: Secondary | ICD-10-CM | POA: Diagnosis not present

## 2021-04-28 DIAGNOSIS — I10 Essential (primary) hypertension: Secondary | ICD-10-CM | POA: Diagnosis not present

## 2021-04-28 DIAGNOSIS — I129 Hypertensive chronic kidney disease with stage 1 through stage 4 chronic kidney disease, or unspecified chronic kidney disease: Secondary | ICD-10-CM | POA: Diagnosis not present

## 2021-04-28 DIAGNOSIS — Z886 Allergy status to analgesic agent status: Secondary | ICD-10-CM | POA: Diagnosis not present

## 2021-04-28 DIAGNOSIS — N401 Enlarged prostate with lower urinary tract symptoms: Secondary | ICD-10-CM | POA: Diagnosis not present

## 2021-04-28 DIAGNOSIS — N471 Phimosis: Secondary | ICD-10-CM | POA: Diagnosis not present

## 2021-04-28 DIAGNOSIS — I2510 Atherosclerotic heart disease of native coronary artery without angina pectoris: Secondary | ICD-10-CM | POA: Diagnosis not present

## 2021-04-28 DIAGNOSIS — Z7901 Long term (current) use of anticoagulants: Secondary | ICD-10-CM | POA: Diagnosis not present

## 2021-04-28 DIAGNOSIS — Z86718 Personal history of other venous thrombosis and embolism: Secondary | ICD-10-CM | POA: Diagnosis not present

## 2021-04-28 DIAGNOSIS — Z9989 Dependence on other enabling machines and devices: Secondary | ICD-10-CM | POA: Diagnosis not present

## 2021-04-28 DIAGNOSIS — Z79899 Other long term (current) drug therapy: Secondary | ICD-10-CM | POA: Diagnosis not present

## 2021-04-28 DIAGNOSIS — N138 Other obstructive and reflux uropathy: Secondary | ICD-10-CM | POA: Diagnosis not present

## 2021-04-28 DIAGNOSIS — K449 Diaphragmatic hernia without obstruction or gangrene: Secondary | ICD-10-CM | POA: Diagnosis not present

## 2021-04-28 DIAGNOSIS — J449 Chronic obstructive pulmonary disease, unspecified: Secondary | ICD-10-CM | POA: Diagnosis not present

## 2021-04-28 DIAGNOSIS — E119 Type 2 diabetes mellitus without complications: Secondary | ICD-10-CM | POA: Diagnosis not present

## 2021-04-28 DIAGNOSIS — Z885 Allergy status to narcotic agent status: Secondary | ICD-10-CM | POA: Diagnosis not present

## 2021-04-28 DIAGNOSIS — E785 Hyperlipidemia, unspecified: Secondary | ICD-10-CM | POA: Diagnosis not present

## 2021-04-28 DIAGNOSIS — E1122 Type 2 diabetes mellitus with diabetic chronic kidney disease: Secondary | ICD-10-CM | POA: Diagnosis not present

## 2021-04-28 DIAGNOSIS — N189 Chronic kidney disease, unspecified: Secondary | ICD-10-CM | POA: Diagnosis not present

## 2021-04-28 DIAGNOSIS — Z951 Presence of aortocoronary bypass graft: Secondary | ICD-10-CM | POA: Diagnosis not present

## 2021-04-28 DIAGNOSIS — K219 Gastro-esophageal reflux disease without esophagitis: Secondary | ICD-10-CM | POA: Diagnosis not present

## 2021-04-28 DIAGNOSIS — G4733 Obstructive sleep apnea (adult) (pediatric): Secondary | ICD-10-CM | POA: Diagnosis not present

## 2021-04-29 ENCOUNTER — Telehealth: Payer: Self-pay

## 2021-04-29 NOTE — Telephone Encounter (Addendum)
Initiated Prior authorization [id number] SureClick '140MG'$ /ML auto-injectors ?Via: Covermymeds ?Case/Key: [id number] ?Status: approved as of 04/29/21 ?Reason: ?Notified Pt via: Mychart ?

## 2021-04-30 DIAGNOSIS — N471 Phimosis: Secondary | ICD-10-CM | POA: Diagnosis not present

## 2021-05-06 ENCOUNTER — Other Ambulatory Visit: Payer: Self-pay

## 2021-05-06 ENCOUNTER — Encounter: Payer: Self-pay | Admitting: Hematology & Oncology

## 2021-05-06 ENCOUNTER — Telehealth: Payer: Self-pay | Admitting: Pharmacy Technician

## 2021-05-06 ENCOUNTER — Other Ambulatory Visit (HOSPITAL_COMMUNITY): Payer: Self-pay

## 2021-05-06 ENCOUNTER — Ambulatory Visit (HOSPITAL_BASED_OUTPATIENT_CLINIC_OR_DEPARTMENT_OTHER)
Admission: RE | Admit: 2021-05-06 | Discharge: 2021-05-06 | Disposition: A | Discharge status: Home / Self Care | Payer: Medicare Other | Source: Ambulatory Visit | Attending: Hematology & Oncology | Admitting: Hematology & Oncology

## 2021-05-06 ENCOUNTER — Telehealth: Payer: Self-pay | Admitting: *Deleted

## 2021-05-06 ENCOUNTER — Inpatient Hospital Stay (HOSPITAL_BASED_OUTPATIENT_CLINIC_OR_DEPARTMENT_OTHER): Payer: Medicare Other | Admitting: Hematology & Oncology

## 2021-05-06 ENCOUNTER — Inpatient Hospital Stay: Payer: Medicare Other | Attending: Hematology & Oncology

## 2021-05-06 VITALS — BP 82/58 | HR 62 | Temp 98.0°F | Resp 18 | Ht 72.0 in | Wt 208.0 lb

## 2021-05-06 DIAGNOSIS — D509 Iron deficiency anemia, unspecified: Secondary | ICD-10-CM | POA: Insufficient documentation

## 2021-05-06 DIAGNOSIS — C9000 Multiple myeloma not having achieved remission: Secondary | ICD-10-CM

## 2021-05-06 DIAGNOSIS — Z7901 Long term (current) use of anticoagulants: Secondary | ICD-10-CM | POA: Insufficient documentation

## 2021-05-06 DIAGNOSIS — M549 Dorsalgia, unspecified: Secondary | ICD-10-CM | POA: Diagnosis not present

## 2021-05-06 DIAGNOSIS — C9002 Multiple myeloma in relapse: Secondary | ICD-10-CM | POA: Insufficient documentation

## 2021-05-06 DIAGNOSIS — D500 Iron deficiency anemia secondary to blood loss (chronic): Secondary | ICD-10-CM | POA: Diagnosis not present

## 2021-05-06 DIAGNOSIS — N189 Chronic kidney disease, unspecified: Secondary | ICD-10-CM

## 2021-05-06 DIAGNOSIS — R079 Chest pain, unspecified: Secondary | ICD-10-CM | POA: Diagnosis not present

## 2021-05-06 LAB — CMP (CANCER CENTER ONLY)
ALT: 14 U/L (ref 0–44)
AST: 17 U/L (ref 15–41)
Albumin: 4.4 g/dL (ref 3.5–5.0)
Alkaline Phosphatase: 114 U/L (ref 38–126)
Anion gap: 9 (ref 5–15)
BUN: 19 mg/dL (ref 8–23)
CO2: 28 mmol/L (ref 22–32)
Calcium: 9.6 mg/dL (ref 8.9–10.3)
Chloride: 105 mmol/L (ref 98–111)
Creatinine: 1.67 mg/dL — ABNORMAL HIGH (ref 0.61–1.24)
GFR, Estimated: 41 mL/min — ABNORMAL LOW (ref >60)
Glucose, Bld: 89 mg/dL (ref 70–99)
Potassium: 3.9 mmol/L (ref 3.5–5.1)
Sodium: 142 mmol/L (ref 135–145)
Total Bilirubin: 0.4 mg/dL (ref 0.3–1.2)
Total Protein: 6.8 g/dL (ref 6.5–8.1)

## 2021-05-06 LAB — RETICULOCYTES
Immature Retic Fract: 11.9 % (ref 2.3–15.9)
RBC.: 4.44 MIL/uL (ref 4.22–5.81)
Retic Count, Absolute: 51.9 10*3/uL (ref 19.0–186.0)
Retic Ct Pct: 1.2 % (ref 0.4–3.1)

## 2021-05-06 LAB — CBC WITH DIFFERENTIAL (CANCER CENTER ONLY)
Abs Immature Granulocytes: 0.01 10*3/uL (ref 0.00–0.07)
Basophils Absolute: 0 10*3/uL (ref 0.0–0.1)
Basophils Relative: 0 %
Eosinophils Absolute: 0.1 10*3/uL (ref 0.0–0.5)
Eosinophils Relative: 2 %
HCT: 41.4 % (ref 39.0–52.0)
Hemoglobin: 13.3 g/dL (ref 13.0–17.0)
Immature Granulocytes: 0 %
Lymphocytes Relative: 25 %
Lymphs Abs: 1.3 10*3/uL (ref 0.7–4.0)
MCH: 30 pg (ref 26.0–34.0)
MCHC: 32.1 g/dL (ref 30.0–36.0)
MCV: 93.5 fL (ref 80.0–100.0)
Monocytes Absolute: 0.5 10*3/uL (ref 0.1–1.0)
Monocytes Relative: 10 %
Neutro Abs: 3.4 10*3/uL (ref 1.7–7.7)
Neutrophils Relative %: 63 %
Platelet Count: 171 10*3/uL (ref 150–400)
RBC: 4.43 MIL/uL (ref 4.22–5.81)
RDW: 14 % (ref 11.5–15.5)
WBC Count: 5.3 10*3/uL (ref 4.0–10.5)
nRBC: 0 % (ref 0.0–0.2)

## 2021-05-06 LAB — IRON AND IRON BINDING CAPACITY (CC-WL,HP ONLY)
Iron: 75 ug/dL (ref 45–182)
Saturation Ratios: 27 % (ref 17.9–39.5)
TIBC: 279 ug/dL (ref 250–450)
UIBC: 204 ug/dL (ref 117–376)

## 2021-05-06 LAB — FERRITIN: Ferritin: 406 ng/mL — ABNORMAL HIGH (ref 24–336)

## 2021-05-06 LAB — LACTATE DEHYDROGENASE: LDH: 110 U/L (ref 98–192)

## 2021-05-06 MED ORDER — IXAZOMIB CITRATE 3 MG PO CAPS
ORAL_CAPSULE | ORAL | 4 refills | Status: DC
  Filled 2021-05-06: qty 12, fill #0

## 2021-05-06 NOTE — Progress Notes (Signed)
?Hematology and Oncology Follow Up Visit ? ?[name] [name] ?[id number] ?Apr 13, 1940 81 y.o. ?05/06/2021 ? ? ?Principle Diagnosis:  ?IgG kappa myeloma - Relapsed -- 1q duplication ?Acute thromboembolism of the right lower leg ?Iron deficiency anemia ?  ?Past Therapy:        ?S/P ASCT at [facility] on 04/02/2015 ?Patient s/p cycle 5 of Velcade/Revlimid/Decadron ?Revlimid '10mg'$  po q day (21/28) -- d/c on 09/15/2017 ?  ?Current Therapy:     ?Faspro/Velcade/Decadron -- s/p cycle 6- started 03/20/2020   -- d/c velcade and start Kyprolis on 09/27/2020 -- d/c on 01/29/2021 for CHF ?Ninlaro 3 mg PO q week (3 week on/1 week off) 00 start on 05/11/2021 ?Zometa 4 mg IV every 2 months  - next dose due 05/2021 ?Xarelto 10 mg by mouth daily ?IV iron-Monoferric given on 02/24/2021. ?  ?Interim History:  [name] [name] is here today for follow-up.  Unfortunately, he has had his problems.  He fell this morning.  He was in the shower.  He fell over the top of the shower.  He landed on his back.  Thankfully did not hit his head.  He did not pass out.  He said he just lost his balance. ? ?Hopefully, he did not break anything.  I do not think he broke anything.  However, we will have to get x-rays to make sure everything is all right. ? ?He is having some pain substernally.  He is having some pain in the mid back.   ? ?His blood pressure is incredibly low.  He is on Entresto, Toprol-XL, and Lasix.  He had an echocardiogram done recently which showed that he had a ejection fraction of 40-45%. ? ?I told her to stop the Lasix.  His pressure is just too low.  He does not have any edema in the legs. ? ?I think another problem is that we have not treated him probably for about 4 months or so.  His Kappa light chain is going up.  More last saw him in February, the Kappa light chain was 6 to milligrams per liter.  I am sure that it is higher now. ? ?I think that we probably should try him on Ninlaro.  I think this would be a reasonable option for him.  I  would try him on the 3 mg weekly dose.  He would do 3 weeks on and 1 week off. ? ?I am going to do a 24-hour urine on him today. ? ?He has had a decent appetite.  He has had no nausea or vomiting.  He has had no bleeding.  He is on Xarelto and Plavix. ? ?He did have circumcision surgery recently.  This was quite uncomfortable for him. ? ?Overall, I would say his performance status is still probably pretty decent at ECOG 1.   ? ?Medications:  ?Allergies as of 05/06/2021   ? ?   Reactions  ? Budesonide-formoterol Fumarate Itching, Other (See Comments)  ? Codeine Itching  ? Hydrocodone-acetaminophen Itching  ? Mushroom Extract Complex Nausea And Vomiting, Other (See Comments)  ? Only shitake mushroom  causes this reaction. ?Flu-like symptoms from portabello mushrooms  ? ?  ? ?  ?Medication List  ?  ? ?  ? Accurate as of May 06, 2021 11:24 AM. If you have any questions, ask your nurse or doctor.  ?  ?  ? ?  ? ?STOP taking these medications   ? ?LORazepam 1 MG tablet ?Commonly known as: ATIVAN ?Stopped by: [name],  MD ?  ? ?  ? ?TAKE these medications   ? ?cetirizine 10 MG tablet ?Commonly known as: ZYRTEC ?TAKE 1 TABLET EVERY DAY ?  ?clopidogrel 75 MG tablet ?Commonly known as: PLAVIX ?Take 75 mg by mouth daily. ?  ?Cyanocobalamin 1000 MCG Tbcr ?Take 1 tablet by mouth daily. ?  ?dexamethasone 4 MG tablet ?Commonly known as: DECADRON ?TAKE 5 TABLETS ONCE ON THE DAY OF TREATMENT ?  ?empagliflozin 10 MG Tabs tablet ?Commonly known as: JARDIANCE ?Take by mouth. ?  ?esomeprazole 40 MG capsule ?Commonly known as: [place] ?Take 1 capsule (40 mg total) by mouth daily at 12 noon. ?  ?famciclovir 500 MG tablet ?Commonly known as: FAMVIR ?TAKE 1 TABLET (500 MG TOTAL) BY MOUTH DAILY. ?  ?fluticasone 50 MCG/ACT nasal spray ?Commonly known as: FLONASE ?Place 2 sprays into both nostrils daily. ?  ?furosemide 20 MG tablet ?Commonly known as: LASIX ?Take 20 mg by mouth daily as needed. ?  ?gabapentin 600 MG tablet ?Commonly  known as: NEURONTIN ?Take 1,800 mg by mouth daily. ?  ?hydrocortisone 2.5 % ointment ?Apply topically 2 (two) times daily. ?  ?levothyroxine 100 MCG tablet ?Commonly known as: SYNTHROID ?TAKE 1 TABLET EVERY DAY BEFORE BREAKFAST ?  ?metoprolol succinate 50 MG 24 hr tablet ?Commonly known as: TOPROL-XL ?Take 50 mg by mouth daily. ?  ?nitroGLYCERIN 0.4 MG SL tablet ?Commonly known as: NITROSTAT ?PLACE 1 TABLET UNDER THE TONGUE EVERY 5 (FIVE) MINUTES AS NEEDED FOR CHEST PAIN AS DIRECTED ?  ?ondansetron 8 MG tablet ?Commonly known as: Zofran ?Take 1 tablet (8 mg total) by mouth 2 (two) times daily as needed (Nausea or vomiting). ?  ?oxyCODONE-acetaminophen 10-325 MG tablet ?Commonly known as: PERCOCET ?Take 1 tablet by mouth every 8 (eight) hours as needed for pain. for pain ?  ?potassium chloride SA 20 MEQ tablet ?Commonly known as: KLOR-CON M ?TAKE 1 TABLET TWICE DAILY ?  ?prochlorperazine 10 MG tablet ?Commonly known as: COMPAZINE ?Take 1 tablet (10 mg total) by mouth every 6 (six) hours as needed (Nausea or vomiting). ?  ?pyridoxine 100 MG tablet ?Commonly known as: B-6 ?Take 100 mg by mouth daily. ?  ?Repatha SureClick 294 MG/ML Soaj ?Generic drug: Evolocumab ?Inject 140 mg into the skin every 14 (fourteen) days. ?  ?rivaroxaban 10 MG Tabs tablet ?Commonly known as: XARELTO ?Take 1 tablet (10 mg total) by mouth daily with supper. ?  ?sacubitril-valsartan 97-103 MG ?Commonly known as: ENTRESTO ?Take 1 tablet by mouth 2 (two) times daily. ?  ?tamsulosin 0.4 MG Caps capsule ?Commonly known as: FLOMAX ?Take 1 capsule (0.4 mg total) by mouth daily after supper. ?  ?Zoledronic Acid 4 MG Solr ?Inject into the vein. ?  ? ?  ? ? ?Allergies:  ?Allergies  ?Allergen Reactions  ? Budesonide-Formoterol Fumarate Itching and Other (See Comments)  ? Codeine Itching  ? Hydrocodone-Acetaminophen Itching  ? Mushroom Extract Complex Nausea And Vomiting and Other (See Comments)  ?  Only shitake mushroom  causes this reaction. ?Flu-like  symptoms from portabello mushrooms  ? ? ?Past Medical History, Surgical history, Social history, and Family History were reviewed and updated. ? ?Review of Systems: ?Review of Systems  ?Constitutional: Negative.   ?HENT: Negative.    ?Eyes: Negative.   ?Respiratory: Negative.    ?Cardiovascular:  Positive for palpitations.  ?Gastrointestinal: Negative.   ?Genitourinary:  Positive for frequency and urgency.  ?Musculoskeletal:  Positive for back pain.  ?Skin: Negative.   ?Neurological: Negative.   ?Endo/Heme/Allergies: Negative.   ?  Psychiatric/Behavioral: Negative.    ? ? ?Physical Exam: ? height is 6' (1.829 m) and weight is 208 lb 0.6 oz (94.4 kg). His oral temperature is 98 ?F (36.7 ?C). His blood pressure is 80/48 (abnormal) and his pulse is 62. His respiration is 18 and oxygen saturation is 98%.  ? ?Wt Readings from Last 3 Encounters:  ?05/06/21 208 lb 0.6 oz (94.4 kg)  ?03/25/21 207 lb 6.4 oz (94.1 kg)  ?02/18/21 205 lb 0.4 oz (93 kg)  ? ? ?Physical Exam ?Vitals reviewed.  ?HENT:  ?   Head: Normocephalic and atraumatic.  ?Eyes:  ?   Pupils: Pupils are equal, round, and reactive to light.  ?Cardiovascular:  ?   Rate and Rhythm: Normal rate and regular rhythm.  ?   Heart sounds: Normal heart sounds.  ?Pulmonary:  ?   Effort: Pulmonary effort is normal.  ?   Breath sounds: Normal breath sounds.  ?Abdominal:  ?   General: Bowel sounds are normal.  ?   Palpations: Abdomen is soft.  ?Musculoskeletal:     ?   General: No tenderness or deformity. Normal range of motion.  ?   Cervical back: Normal range of motion.  ?Lymphadenopathy:  ?   Cervical: No cervical adenopathy.  ?Skin: ?   General: Skin is warm and dry.  ?   Findings: No erythema or rash.  ?Neurological:  ?   Mental Status: He is alert and oriented to person, place, and time.  ?Psychiatric:     ?   Behavior: Behavior normal.     ?   Thought Content: Thought content normal.     ?   Judgment: Judgment normal.  ? ? ?Lab Results  ?Component Value Date  ? WBC 5.3  05/06/2021  ? HGB 13.3 05/06/2021  ? HCT 41.4 05/06/2021  ? MCV 93.5 05/06/2021  ? PLT 171 05/06/2021  ? ?Lab Results  ?Component Value Date  ? FERRITIN 221 02/18/2021  ? IRON 39 (L) 02/18/2021  ? TIBC 307 01/0

## 2021-05-06 NOTE — Telephone Encounter (Signed)
-----   Message from [name], MD sent at 05/06/2021  2:15 PM EDT ----- ?Call and let him know that there is no fractures in his sternum, ribs or back.  pete ?

## 2021-05-06 NOTE — Telephone Encounter (Signed)
As noted below by Dr. Marin Olp, I left a message informing him that there is no fractures in your sternum, ribs or back. Please call the office if you have any questions or concerns. ?

## 2021-05-07 ENCOUNTER — Telehealth: Payer: Self-pay | Admitting: Pharmacist

## 2021-05-07 ENCOUNTER — Other Ambulatory Visit (HOSPITAL_COMMUNITY): Payer: Self-pay

## 2021-05-07 DIAGNOSIS — C9002 Multiple myeloma in relapse: Secondary | ICD-10-CM

## 2021-05-07 LAB — IGG, IGA, IGM
IgA: 12 mg/dL — ABNORMAL LOW (ref 61–437)
IgG (Immunoglobin G), Serum: 480 mg/dL — ABNORMAL LOW (ref 603–1613)
IgM (Immunoglobulin M), Srm: 10 mg/dL — ABNORMAL LOW (ref 15–143)

## 2021-05-07 MED ORDER — IXAZOMIB CITRATE 3 MG PO CAPS
ORAL_CAPSULE | ORAL | 4 refills | Status: DC
  Filled 2021-05-07: qty 3, 28d supply, fill #0

## 2021-05-07 NOTE — Telephone Encounter (Signed)
Oral Oncology Pharmacist Encounter ? ?Received new prescription for Ninlaro (ixazomib) for the treatment of multiple myeloma, planned duration until disease progression or unacceptable drug toxicity. ? ?CBC w/ Diff and CMP from 05/06/21 assessed, noted pt with baseline renal dysfunction. Scr of 1.67 mg/dL (CrCl ~47 mL/min). Recommend continued monitoring of renal dysfunction while on Ninlaro - pt already starting on dose reduction per MD. ? ?Current medication list in Epic reviewed, no relevant/significant DDIs with Ninlaro identified. ? ?Evaluated chart and no patient barriers to medication adherence noted.  ? ?Prescription has been e-scribed to the [facility] for benefits analysis and approval. ? ?Oral Oncology Clinic will continue to follow for insurance authorization, copayment issues, initial counseling and start date. ? ?Leron Croak, PharmD, BCPS ?Hematology/Oncology Clinical Pharmacist ?[facility] Oral Chemotherapy Navigation Clinics ?[phone] ?05/07/2021 11:04 AM ? ?

## 2021-05-07 NOTE — Telephone Encounter (Signed)
Oral Oncology Patient Advocate Encounter ? ?Prior Authorization for [name] [name] has been approved.   ? ?PA# [id number] ?Effective dates: 05/07/21 through 11/03/21 ? ?Patients co-pay is $1714.61. ? ?Oral Oncology Clinic will continue to follow.  ? ?Dennison Nancy CPHT ?Specialty Pharmacy Patient Advocate ?[place] ?Phone [phone] ?Fax [phone] ?05/07/2021 10:17 AM ? ?

## 2021-05-07 NOTE — Telephone Encounter (Signed)
Oral Oncology Patient Advocate Encounter ?  ?Received notification from [facility] that prior authorization for Kennieth Rad is required. ?  ?PA submitted on CoverMyMeds ?Key [id number]  ?Status is pending ?  ?Oral Oncology Clinic will continue to follow. ? ?Dennison Nancy CPHT ?Specialty Pharmacy Patient Advocate ?[place] ?Phone [phone] ?Fax [phone] ?05/07/2021 10:16 AM ? ? ?

## 2021-05-10 LAB — KAPPA/LAMBDA LIGHT CHAINS
Kappa free light chain: 124.8 mg/L — ABNORMAL HIGH (ref 3.3–19.4)
Kappa, lambda light chain ratio: 33.73 — ABNORMAL HIGH (ref 0.26–1.65)
Lambda free light chains: 3.7 mg/L — ABNORMAL LOW (ref 5.7–26.3)

## 2021-05-10 LAB — PROTEIN ELECTROPHORESIS, SERUM, WITH REFLEX
A/G Ratio: 1.5 (ref 0.7–1.7)
Albumin ELP: 3.6 g/dL (ref 2.9–4.4)
Alpha-1-Globulin: 0.3 g/dL (ref 0.0–0.4)
Alpha-2-Globulin: 0.9 g/dL (ref 0.4–1.0)
Beta Globulin: 0.9 g/dL (ref 0.7–1.3)
Gamma Globulin: 0.4 g/dL (ref 0.4–1.8)
Globulin, Total: 2.4 g/dL (ref 2.2–3.9)
Total Protein ELP: 6 g/dL (ref 6.0–8.5)

## 2021-05-11 DIAGNOSIS — I2510 Atherosclerotic heart disease of native coronary artery without angina pectoris: Secondary | ICD-10-CM | POA: Diagnosis not present

## 2021-05-14 DIAGNOSIS — Z7901 Long term (current) use of anticoagulants: Secondary | ICD-10-CM | POA: Diagnosis not present

## 2021-05-14 DIAGNOSIS — C9002 Multiple myeloma in relapse: Secondary | ICD-10-CM | POA: Diagnosis not present

## 2021-05-14 DIAGNOSIS — D509 Iron deficiency anemia, unspecified: Secondary | ICD-10-CM | POA: Diagnosis not present

## 2021-05-17 LAB — UPEP/UIFE/LIGHT CHAINS/TP, 24-HR UR
% BETA, Urine: 54.4 %
ALPHA 1 URINE: 8 %
Albumin, U: 20 %
Alpha 2, Urine: 11.9 %
Free Kappa Lt Chains,Ur: 221.02 mg/L — ABNORMAL HIGH (ref 1.17–86.46)
Free Kappa/Lambda Ratio: 39.54 — ABNORMAL HIGH (ref 1.83–14.26)
Free Lambda Lt Chains,Ur: 5.59 mg/L (ref 0.27–15.21)
GAMMA GLOBULIN URINE: 5.7 %
M-SPIKE %, Urine: 15.6 % — ABNORMAL HIGH
M-Spike, Mg/24 Hr: 36 mg/24 hr — ABNORMAL HIGH
Total Protein, Urine-Ur/day: 230 mg/24 hr — ABNORMAL HIGH (ref 30–150)
Total Protein, Urine: 10.2 mg/dL
Total Volume: 2250

## 2021-05-19 DIAGNOSIS — L570 Actinic keratosis: Secondary | ICD-10-CM | POA: Diagnosis not present

## 2021-05-20 ENCOUNTER — Other Ambulatory Visit (HOSPITAL_COMMUNITY): Payer: Self-pay

## 2021-05-20 NOTE — Telephone Encounter (Signed)
Oral Oncology Pharmacist Encounter ? ?Patient will not be proceeding forward with Ninlaro at this time due to high cost and no current grants open for funding nor manufacturer assistance for this agent.  ? ?Leron Croak, PharmD, BCPS ?Hematology/Oncology Clinical Pharmacist ?[facility] Oral Chemotherapy Navigation Clinics ?[phone] ?05/20/2021 10:30 AM ? ? ?

## 2021-05-23 ENCOUNTER — Other Ambulatory Visit: Payer: Self-pay | Admitting: Sports Medicine

## 2021-05-24 ENCOUNTER — Other Ambulatory Visit: Payer: Self-pay | Admitting: Hematology & Oncology

## 2021-05-26 ENCOUNTER — Other Ambulatory Visit: Payer: Self-pay | Admitting: Sports Medicine

## 2021-05-26 ENCOUNTER — Other Ambulatory Visit: Payer: Self-pay | Admitting: Hematology & Oncology

## 2021-05-26 DIAGNOSIS — I2510 Atherosclerotic heart disease of native coronary artery without angina pectoris: Secondary | ICD-10-CM

## 2021-05-26 DIAGNOSIS — I2699 Other pulmonary embolism without acute cor pulmonale: Secondary | ICD-10-CM

## 2021-05-27 ENCOUNTER — Other Ambulatory Visit (HOSPITAL_COMMUNITY): Payer: Self-pay

## 2021-05-27 ENCOUNTER — Other Ambulatory Visit: Payer: Self-pay | Admitting: Hematology & Oncology

## 2021-05-27 ENCOUNTER — Telehealth: Payer: Self-pay | Admitting: Pharmacy Technician

## 2021-05-27 ENCOUNTER — Other Ambulatory Visit: Payer: Self-pay | Admitting: *Deleted

## 2021-05-27 ENCOUNTER — Telehealth: Payer: Self-pay | Admitting: Pharmacist

## 2021-05-27 DIAGNOSIS — C9000 Multiple myeloma not having achieved remission: Secondary | ICD-10-CM

## 2021-05-27 MED ORDER — ONDANSETRON HCL 8 MG PO TABS: 8.0000 mg | ORAL_TABLET | Freq: Three times a day (TID) | ORAL | 3 refills | Status: DC | PRN

## 2021-05-27 MED ORDER — SELINEXOR (80 MG ONCE WEEKLY) 40 MG PO TBPK
80.0000 mg | ORAL_TABLET | ORAL | 5 refills | Status: DC
  Filled 2021-05-27: qty 8, 28d supply, fill #0

## 2021-05-27 MED ORDER — DEXAMETHASONE 4 MG PO TABS: ORAL_TABLET | ORAL | 3 refills | Status: DC

## 2021-05-27 NOTE — Telephone Encounter (Addendum)
Oral Oncology Pharmacist Encounter ? ?Received new prescription for Xpovio (selinexor) for the treatment of R/R multiple myeloma in conjunction with bortezomib and dexamethasone, planned duration until disease progression or unacceptable drug toxicity. ? ?CBC w/ Diff and CMP from 05/06/21 assessed, noted Scr of 1.67 mg/dL (CrCL ~47.1 mL/min). All other labs stable. Prescription dose and frequency assessed for appropriateness. Appropriate for therapy initiation.  ? ?Current medication list in Epic reviewed, no relevant/significant DDIs with Xpovio identified. ? ?Evaluated chart and no patient barriers to medication adherence noted.  ? ?Prescription has been e-scribed to the [facility] for benefits analysis and approval. ? ?Oral Oncology Clinic will continue to follow for insurance authorization, copayment issues, initial counseling and start date. ? ?Leron Croak, PharmD, BCPS ?Hematology/Oncology Clinical Pharmacist ?[facility] Oral Chemotherapy Navigation Clinics ?[phone] ?05/27/2021 1:39 PM ? ? ?

## 2021-05-27 NOTE — Telephone Encounter (Signed)
Oral Oncology Patient Advocate Encounter ?  ?Received notification from [facility] that prior authorization for Xpovio is required. ?  ?PA submitted on CoverMyMeds ?Key [id number] ?Status is pending ?  ?Oral Oncology Clinic will continue to follow. ? ?Dennison Nancy CPHT ?Specialty Pharmacy Patient Advocate ?[place] ?Phone [phone] ?Fax [phone] ?05/28/2021 9:29 AM ? ?

## 2021-05-28 NOTE — Telephone Encounter (Signed)
Oral Oncology Patient Advocate Encounter ? ?Received notification from [facility] that the request for prior authorization for Xpovio has been denied due to: The accepted use of this drug for your condition requires that when used in combination with dexamethasone alone, your disease has not responded (refractory) to at least two immunomodulatory agents. You do not meet these criteria..   ?  ?An appeal is currently being worked on. ? ?This encounter will continue to be updated until final determination.   ? ?Dennison Nancy CPHT ?Specialty Pharmacy Patient Advocate ?[place] ?Phone [phone] ?Fax [phone] ?05/28/2021 9:31 AM ? ?

## 2021-05-31 ENCOUNTER — Telehealth: Payer: Self-pay | Admitting: *Deleted

## 2021-05-31 ENCOUNTER — Other Ambulatory Visit (HOSPITAL_COMMUNITY): Payer: Self-pay

## 2021-05-31 NOTE — Telephone Encounter (Signed)
Oral Oncology Patient Advocate Encounter ? ?Received fax notification that the appeal for the Xpovio PA denial was overturned. ? ?Prior Authorization is now approved.   ? ?PA# [id number] ?Effective dates: 02/14/21 through 02/13/22 ? ?Patients co-pay is $2123.39. ? ?Oral Oncology Clinic will continue to follow.  ? ?Dennison Nancy CPHT ?Specialty Pharmacy Patient Advocate ?[place] ?Phone [phone] ?Fax [phone] ?05/31/2021 9:10 AM ? ?

## 2021-05-31 NOTE — Telephone Encounter (Signed)
Patient called stating that he is in the donut hole and his Xarelto 10 mg is costing 150 dollars a month.  Requesting stopping the medication.  Dr Marin Olp stated it is ok to stop the Xarelto at this time.  Patient called with this information ? ?

## 2021-06-01 ENCOUNTER — Telehealth: Payer: Self-pay | Admitting: Pharmacy Technician

## 2021-06-01 DIAGNOSIS — R310 Gross hematuria: Secondary | ICD-10-CM | POA: Diagnosis not present

## 2021-06-01 DIAGNOSIS — N471 Phimosis: Secondary | ICD-10-CM | POA: Diagnosis not present

## 2021-06-01 DIAGNOSIS — N401 Enlarged prostate with lower urinary tract symptoms: Secondary | ICD-10-CM | POA: Diagnosis not present

## 2021-06-01 DIAGNOSIS — N138 Other obstructive and reflux uropathy: Secondary | ICD-10-CM | POA: Diagnosis not present

## 2021-06-01 NOTE — Telephone Encounter (Signed)
Oral Oncology Patient Advocate Encounter ? ?Received notification from [facility] that patient has been successfully enrolled into their program to receive Xpovio from the manufacturer at $0 out of pocket until 02/13/22.   ? ?Patient knows to call the office with questions or concerns. ?  ?Oral Oncology Clinic will continue to follow. ? ?Dennison Nancy CPHT ?Specialty Pharmacy Patient Advocate ?[place] ?Phone [phone] ?Fax [phone] ?06/01/2021 12:29 PM ? ?

## 2021-06-01 NOTE — Telephone Encounter (Signed)
Oral Oncology Patient Advocate Encounter ? ?Spoke to patient over the phone to coordinate signing an application for [employer] in an effort to reduce patient's out of pocket expense for Xpovio to $0.   ? ?Application completed and faxed to [phone] on 05/31/21.  ? ?[employer] patient assistance phone number for follow up is [phone].  ? ?This encounter will be updated until final determination.  ? ?Dennison Nancy CPHT ?Specialty Pharmacy Patient Advocate ?[place] ?Phone [phone] ?Fax [phone] ?06/01/2021 12:27 PM ? ?

## 2021-06-02 NOTE — Telephone Encounter (Signed)
Oral Chemotherapy Pharmacist Encounter ? ?Patient Education ?I spoke with patient for overview of new oral chemotherapy medication: Xpovio (selinexor) for the treatment of R/R kappa multiple myeloma, in conjunction with dexamethasone and bortezomib (planned to be added per MD) planned duration until disease progression or unacceptable drug toxicity.  ? ?Pt is doing well. Counseled patient on administration, dosing, side effects, monitoring, drug-food interactions, safe handling, storage, and disposal. ?Patient will take Xpovio (selinexor) 40 mg tablets, 2 tablets (80 mg total) by mouth once a week. ? ?Patient will take Ondansetron 30 minutes before taking Xpovio.  ? ?Patient will also take dexamethasone 4 mg tablets, 5 tablets (20 mg total) by mouth once weekly with Xpovio. Patient will take in the morning with breakfast. ? ?Patient knows to avoid grapefruit and grapefruit juice while on therapy.  ? ?Xpovio start date: 06/02/21 ? ?Side effects include but not limited to: nausea/vomiting, decreased blood counts, decreased appetite, fatigue, diarrhea, changes in electrolytes, and dizziness/mental status changes ? ?Reviewed with patient importance of keeping a medication schedule and plan for any missed doses. ? ?After discussion with patient no patient barriers to medication adherence identified.  ? ?Medication was delivered to patient's home today, 06/02/21. ? ?[name] [name] voiced understanding and appreciation. All questions answered. Medication handout provided. ? ?Provided patient with [facility] phone number. Patient knows to call the office with questions or concerns. Oral Chemotherapy Navigation Clinic will continue to follow. ? ?[name] [name], PharmD, BCPS ?Hematology/Oncology Clinical Pharmacist ?[facility] ?[phone] ?06/02/2021 1:51 PM ? ? ? ?

## 2021-06-02 NOTE — Telephone Encounter (Signed)
Received call from [facility] Patient Assistance that patient will receive Xpovio today, 06/02/21. ? ?Dennison Nancy CPHT ?Specialty Pharmacy Patient Advocate ?[place] ?Phone [phone] ?Fax [phone] ?06/02/2021 12:03 PM ? ?

## 2021-06-08 ENCOUNTER — Inpatient Hospital Stay: Payer: Medicare Other | Attending: Hematology & Oncology

## 2021-06-08 ENCOUNTER — Inpatient Hospital Stay: Payer: Medicare Other

## 2021-06-08 ENCOUNTER — Encounter: Payer: Self-pay | Admitting: Hematology & Oncology

## 2021-06-08 ENCOUNTER — Inpatient Hospital Stay (HOSPITAL_BASED_OUTPATIENT_CLINIC_OR_DEPARTMENT_OTHER): Payer: Medicare Other | Admitting: Hematology & Oncology

## 2021-06-08 VITALS — BP 86/59 | HR 65 | Temp 97.9°F | Resp 18 | Ht 72.0 in | Wt 206.2 lb

## 2021-06-08 DIAGNOSIS — C9002 Multiple myeloma in relapse: Secondary | ICD-10-CM | POA: Insufficient documentation

## 2021-06-08 DIAGNOSIS — Z885 Allergy status to narcotic agent status: Secondary | ICD-10-CM | POA: Insufficient documentation

## 2021-06-08 DIAGNOSIS — I509 Heart failure, unspecified: Secondary | ICD-10-CM | POA: Diagnosis not present

## 2021-06-08 DIAGNOSIS — R002 Palpitations: Secondary | ICD-10-CM | POA: Diagnosis not present

## 2021-06-08 DIAGNOSIS — Z79899 Other long term (current) drug therapy: Secondary | ICD-10-CM | POA: Insufficient documentation

## 2021-06-08 DIAGNOSIS — M899 Disorder of bone, unspecified: Secondary | ICD-10-CM

## 2021-06-08 DIAGNOSIS — R3915 Urgency of urination: Secondary | ICD-10-CM | POA: Insufficient documentation

## 2021-06-08 DIAGNOSIS — D500 Iron deficiency anemia secondary to blood loss (chronic): Secondary | ICD-10-CM

## 2021-06-08 DIAGNOSIS — R197 Diarrhea, unspecified: Secondary | ICD-10-CM | POA: Diagnosis not present

## 2021-06-08 DIAGNOSIS — C9000 Multiple myeloma not having achieved remission: Secondary | ICD-10-CM

## 2021-06-08 DIAGNOSIS — D509 Iron deficiency anemia, unspecified: Secondary | ICD-10-CM | POA: Diagnosis not present

## 2021-06-08 DIAGNOSIS — R350 Frequency of micturition: Secondary | ICD-10-CM | POA: Diagnosis not present

## 2021-06-08 DIAGNOSIS — Z9221 Personal history of antineoplastic chemotherapy: Secondary | ICD-10-CM | POA: Diagnosis not present

## 2021-06-08 DIAGNOSIS — M549 Dorsalgia, unspecified: Secondary | ICD-10-CM | POA: Diagnosis not present

## 2021-06-08 LAB — CMP (CANCER CENTER ONLY)
ALT: 32 U/L (ref 0–44)
AST: 20 U/L (ref 15–41)
Albumin: 4.4 g/dL (ref 3.5–5.0)
Alkaline Phosphatase: 115 U/L (ref 38–126)
Anion gap: 10 (ref 5–15)
BUN: 28 mg/dL — ABNORMAL HIGH (ref 8–23)
CO2: 24 mmol/L (ref 22–32)
Calcium: 9.3 mg/dL (ref 8.9–10.3)
Chloride: 105 mmol/L (ref 98–111)
Creatinine: 1.61 mg/dL — ABNORMAL HIGH (ref 0.61–1.24)
GFR, Estimated: 43 mL/min — ABNORMAL LOW (ref >60)
Glucose, Bld: 99 mg/dL (ref 70–99)
Potassium: 4.5 mmol/L (ref 3.5–5.1)
Sodium: 139 mmol/L (ref 135–145)
Total Bilirubin: 0.6 mg/dL (ref 0.3–1.2)
Total Protein: 6.8 g/dL (ref 6.5–8.1)

## 2021-06-08 LAB — CBC WITH DIFFERENTIAL (CANCER CENTER ONLY)
Abs Immature Granulocytes: 0.02 10*3/uL (ref 0.00–0.07)
Basophils Absolute: 0 10*3/uL (ref 0.0–0.1)
Basophils Relative: 0 %
Eosinophils Absolute: 0 10*3/uL (ref 0.0–0.5)
Eosinophils Relative: 1 %
HCT: 40.4 % (ref 39.0–52.0)
Hemoglobin: 13.2 g/dL (ref 13.0–17.0)
Immature Granulocytes: 0 %
Lymphocytes Relative: 32 %
Lymphs Abs: 1.9 10*3/uL (ref 0.7–4.0)
MCH: 29.7 pg (ref 26.0–34.0)
MCHC: 32.7 g/dL (ref 30.0–36.0)
MCV: 91 fL (ref 80.0–100.0)
Monocytes Absolute: 0.6 10*3/uL (ref 0.1–1.0)
Monocytes Relative: 11 %
Neutro Abs: 3.3 10*3/uL (ref 1.7–7.7)
Neutrophils Relative %: 56 %
Platelet Count: 188 10*3/uL (ref 150–400)
RBC: 4.44 MIL/uL (ref 4.22–5.81)
RDW: 14 % (ref 11.5–15.5)
WBC Count: 5.9 10*3/uL (ref 4.0–10.5)
nRBC: 0 % (ref 0.0–0.2)

## 2021-06-08 LAB — IRON AND IRON BINDING CAPACITY (CC-WL,HP ONLY)
Iron: 62 ug/dL (ref 45–182)
Saturation Ratios: 22 % (ref 17.9–39.5)
TIBC: 280 ug/dL (ref 250–450)
UIBC: 218 ug/dL (ref 117–376)

## 2021-06-08 LAB — LACTATE DEHYDROGENASE: LDH: 109 U/L (ref 98–192)

## 2021-06-08 LAB — FERRITIN: Ferritin: 433 ng/mL — ABNORMAL HIGH (ref 24–336)

## 2021-06-08 MED ORDER — SODIUM CHLORIDE 0.9 % IV SOLN: Freq: Once | INTRAVENOUS | Status: AC

## 2021-06-08 MED ORDER — ZOLEDRONIC ACID 4 MG/100ML IV SOLN
4.0000 mg | Freq: Once | INTRAVENOUS | Status: AC
  Administered 2021-06-08: 4 mg via INTRAVENOUS
  Filled 2021-06-08: qty 100

## 2021-06-08 NOTE — Patient Instructions (Signed)
[facility]  Discharge Instructions: ?Thank you for choosing [facility] to provide your oncology and hematology care.  ? ?If you have a lab appointment with the Cancer Center, please go directly to the Cancer Center and check in at the registration area. ? ?Wear comfortable clothing and clothing appropriate for easy access to any Portacath or PICC line.  ? ?We strive to give you quality time with your provider. You may need to reschedule your appointment if you arrive late (15 or more minutes).  Arriving late affects you and other patients whose appointments are after yours.  Also, if you miss three or more appointments without notifying the office, you may be dismissed from the clinic at the provider?s discretion.    ?  ?For prescription refill requests, have your pharmacy contact our office and allow 72 hours for refills to be completed.   ? ?Today you received the following chemotherapy and/or immunotherapy agents Zometa. ?  ?To help prevent nausea and vomiting after your treatment, we encourage you to take your nausea medication as directed. ? ?BELOW ARE SYMPTOMS THAT SHOULD BE REPORTED IMMEDIATELY: ?*FEVER GREATER THAN 100.4 F (38 ?C) OR HIGHER ?*CHILLS OR SWEATING ?*NAUSEA AND VOMITING THAT IS NOT CONTROLLED WITH YOUR NAUSEA MEDICATION ?*UNUSUAL SHORTNESS OF BREATH ?*UNUSUAL BRUISING OR BLEEDING ?*URINARY PROBLEMS (pain or burning when urinating, or frequent urination) ?*BOWEL PROBLEMS (unusual diarrhea, constipation, pain near the anus) ?TENDERNESS IN MOUTH AND THROAT WITH OR WITHOUT PRESENCE OF ULCERS (sore throat, sores in mouth, or a toothache) ?UNUSUAL RASH, SWELLING OR PAIN  ?UNUSUAL VAGINAL DISCHARGE OR ITCHING  ? ?Items with * indicate a potential emergency and should be followed up as soon as possible or go to the Emergency Department if any problems should occur. ? ?Please show the CHEMOTHERAPY ALERT CARD or IMMUNOTHERAPY ALERT CARD at check-in to the  Emergency Department and triage nurse. ?Should you have questions after your visit or need to cancel or reschedule your appointment, please contact [facility]  [phone] and follow the prompts.  Office hours are 8:00 a.m. to 4:30 p.m. Monday - Friday. Please note that voicemails left after 4:00 p.m. may not be returned until the following business day.  We are closed weekends and major holidays. You have access to a nurse at all times for urgent questions. Please call the main number to the clinic [phone] and follow the prompts. ? ?For any non-urgent questions, you may also contact your provider using MyChart. We now offer e-Visits for anyone 18 and older to request care online for non-urgent symptoms. For details visit [email or web address]. ?  ?Also download the MyChart app! Go to the app store, search "MyChart", open the app, select [facility], and log in with your MyChart username and password. ? ?Due to Covid, a mask is required upon entering the hospital/clinic. If you do not have a mask, one will be given to you upon arrival. For doctor visits, patients may have 1 support person aged 18 or older with them. For treatment visits, patients cannot have anyone with them due to current Covid guidelines and our immunocompromised population.  ?

## 2021-06-08 NOTE — Progress Notes (Signed)
?Hematology and Oncology Follow Up Visit ? ?[name] [name] ?[id number] ?1940-06-30 81 y.o. ?06/08/2021 ? ? ?Principle Diagnosis:  ?IgG kappa myeloma - Relapsed -- 1q duplication ?Acute thromboembolism of the right lower leg ?Iron deficiency anemia ?  ?Past Therapy:        ?S/P ASCT at [facility] on 04/02/2015 ?Patient s/p cycle 5 of Velcade/Revlimid/Decadron ?Revlimid '10mg'$  po q day (21/28) -- d/c on 09/15/2017 ?  ?Current Therapy:     ?Faspro/Velcade/Decadron -- s/p cycle 6- started 03/20/2020   -- d/c velcade and start Kyprolis on 09/27/2020 -- d/c on 01/29/2021 for CHF ?Zometa 4 mg IV every 2 months  - next dose due 08/2021 ?Xpovio 80 mg po q week -- start on 05/15/2021 ?IV iron-Monoferric given on 02/24/2021. ?  ?Interim History:  [name] [name] is here today for follow-up.  We now have him on Xpovio.  Unfortunately, his insurance company would not allow [place] to use the [place] unless we use it with Velcade.  I want to try to go with single agent Xpovio.  I think he is doing okay with this right now.  He does have some fatigue.  He is a little bit of diarrhea. ? ?His last Kappa light chain was 12.5 mg/dL. ? ?His appetite is doing okay. ? ?He is still recovering from the fall that he had a few weeks ago. ? ?He has had no problems with rashes.  He has had no leg swelling. ? ?His last iron studies that we did back in March showed a ferritin of 406 with an iron saturation 27%. ? ?He has had no issues with fever.  He has had no bleeding.  Has been no urinary changes. ? ?His blood pressure is quite low.  He will talk to his cardiologist about this. ? ?Overall, his performance status is probably ECOG 1. ? ? ?Medications:  ?Allergies as of 06/08/2021   ? ?   Reactions  ? Budesonide-formoterol Fumarate Itching, Other (See Comments)  ? Codeine Itching  ? Hydrocodone-acetaminophen Itching  ? Mushroom Extract Complex Nausea And Vomiting, Other (See Comments)  ? Only shitake mushroom  causes this reaction. ?Flu-like symptoms from  portabello mushrooms  ? ?  ? ?  ?Medication List  ?  ? ?  ? Accurate as of June 08, 2021 12:51 PM. If you have any questions, ask your nurse or doctor.  ?  ?  ? ?  ? ?cetirizine 10 MG tablet ?Commonly known as: ZYRTEC ?TAKE 1 TABLET EVERY DAY ?  ?Cyanocobalamin 1000 MCG Tbcr ?Take 1 tablet by mouth daily. ?  ?dexamethasone 4 MG tablet ?Commonly known as: DECADRON ?TAKE 5 TABLETS ONCE WEEKLY WITH XPOVIO ?  ?empagliflozin 10 MG Tabs tablet ?Commonly known as: JARDIANCE ?Take by mouth. ?  ?esomeprazole 40 MG capsule ?Commonly known as: [place] ?TAKE 1 CAPSULE EVERY DAY AT 12:00 PM ?  ?famciclovir 500 MG tablet ?Commonly known as: FAMVIR ?TAKE 1 TABLET (500 MG TOTAL) BY MOUTH DAILY. ?  ?fluticasone 50 MCG/ACT nasal spray ?Commonly known as: FLONASE ?Place 2 sprays into both nostrils daily. ?  ?furosemide 20 MG tablet ?Commonly known as: LASIX ?Take 20 mg by mouth daily as needed. ?  ?gabapentin 600 MG tablet ?Commonly known as: NEURONTIN ?TAKE 1 TABLET THREE TIMES DAILY ?  ?levothyroxine 100 MCG tablet ?Commonly known as: SYNTHROID ?TAKE 1 TABLET EVERY DAY BEFORE BREAKFAST ?  ?metoprolol succinate 50 MG 24 hr tablet ?Commonly known as: TOPROL-XL ?Take 50 mg by mouth daily. ?  ?nitroGLYCERIN 0.4 MG  SL tablet ?Commonly known as: NITROSTAT ?PLACE 1 TABLET UNDER THE TONGUE EVERY 5 (FIVE) MINUTES AS NEEDED FOR CHEST PAIN AS DIRECTED ?  ?ondansetron 8 MG tablet ?Commonly known as: ZOFRAN ?Take 1 tablet (8 mg total) by mouth every 8 (eight) hours as needed for nausea or vomiting. ?  ?oxyCODONE-acetaminophen 10-325 MG tablet ?Commonly known as: PERCOCET ?Take 1 tablet by mouth every 8 (eight) hours as needed for pain. for pain ?  ?potassium chloride SA 20 MEQ tablet ?Commonly known as: KLOR-CON M ?TAKE 1 TABLET TWICE DAILY ?  ?prochlorperazine 10 MG tablet ?Commonly known as: COMPAZINE ?Take 1 tablet (10 mg total) by mouth every 6 (six) hours as needed (Nausea or vomiting). ?  ?pyridoxine 100 MG tablet ?Commonly known as:  B-6 ?Take 100 mg by mouth daily. ?  ?Repatha SureClick 270 MG/ML Soaj ?Generic drug: Evolocumab ?Inject 140 mg into the skin every 14 (fourteen) days. ?  ?sacubitril-valsartan 97-103 MG ?Commonly known as: ENTRESTO ?Take 1 tablet by mouth 2 (two) times daily. ?  ?selinexor Therapy Pack (80 mg once weekly) ?Commonly known as: Xpovio (80 MG Once Weekly) ?Take 2 tablets (80 mg total) by mouth once a week. ?  ?tamsulosin 0.4 MG Caps capsule ?Commonly known as: FLOMAX ?Take 1 capsule (0.4 mg total) by mouth daily after supper. ?  ?Xarelto 10 MG Tabs tablet ?Generic drug: rivaroxaban ?TAKE 1 TABLET (10 MG TOTAL) BY MOUTH DAILY WITH SUPPER. ?  ?Zoledronic Acid 4 MG Solr ?Inject into the vein. ?  ? ?  ? ? ?Allergies:  ?Allergies  ?Allergen Reactions  ? Budesonide-Formoterol Fumarate Itching and Other (See Comments)  ? Codeine Itching  ? Hydrocodone-Acetaminophen Itching  ? Mushroom Extract Complex Nausea And Vomiting and Other (See Comments)  ?  Only shitake mushroom  causes this reaction. ?Flu-like symptoms from portabello mushrooms  ? ? ?Past Medical History, Surgical history, Social history, and Family History were reviewed and updated. ? ?Review of Systems: ?Review of Systems  ?Constitutional: Negative.   ?HENT: Negative.    ?Eyes: Negative.   ?Respiratory: Negative.    ?Cardiovascular:  Positive for palpitations.  ?Gastrointestinal: Negative.   ?Genitourinary:  Positive for frequency and urgency.  ?Musculoskeletal:  Positive for back pain.  ?Skin: Negative.   ?Neurological: Negative.   ?Endo/Heme/Allergies: Negative.   ?Psychiatric/Behavioral: Negative.    ? ? ?Physical Exam: ? height is 6' (1.829 m) and weight is 206 lb 4 oz (93.6 kg). His oral temperature is 97.9 ?F (36.6 ?C). His blood pressure is 86/59 (abnormal) and his pulse is 65. His respiration is 18 and oxygen saturation is 97%.  ? ?Wt Readings from Last 3 Encounters:  ?06/08/21 206 lb 4 oz (93.6 kg)  ?05/06/21 208 lb 0.6 oz (94.4 kg)  ?03/25/21 207 lb 6.4 oz  (94.1 kg)  ? ? ?Physical Exam ?Vitals reviewed.  ?HENT:  ?   Head: Normocephalic and atraumatic.  ?Eyes:  ?   Pupils: Pupils are equal, round, and reactive to light.  ?Cardiovascular:  ?   Rate and Rhythm: Normal rate and regular rhythm.  ?   Heart sounds: Normal heart sounds.  ?Pulmonary:  ?   Effort: Pulmonary effort is normal.  ?   Breath sounds: Normal breath sounds.  ?Abdominal:  ?   General: Bowel sounds are normal.  ?   Palpations: Abdomen is soft.  ?Musculoskeletal:     ?   General: No tenderness or deformity. Normal range of motion.  ?   Cervical back: Normal range  of motion.  ?Lymphadenopathy:  ?   Cervical: No cervical adenopathy.  ?Skin: ?   General: Skin is warm and dry.  ?   Findings: No erythema or rash.  ?Neurological:  ?   Mental Status: He is alert and oriented to person, place, and time.  ?Psychiatric:     ?   Behavior: Behavior normal.     ?   Thought Content: Thought content normal.     ?   Judgment: Judgment normal.  ? ? ?Lab Results  ?Component Value Date  ? WBC 5.9 06/08/2021  ? HGB 13.2 06/08/2021  ? HCT 40.4 06/08/2021  ? MCV 91.0 06/08/2021  ? PLT 188 06/08/2021  ? ?Lab Results  ?Component Value Date  ? FERRITIN 406 (H) 05/06/2021  ? IRON 75 05/06/2021  ? TIBC 279 05/06/2021  ? UIBC 204 05/06/2021  ? IRONPCTSAT 27 05/06/2021  ? ?Lab Results  ?Component Value Date  ? RETICCTPCT 1.2 05/06/2021  ? RBC 4.44 06/08/2021  ? ?Lab Results  ?Component Value Date  ? KPAFRELGTCHN 124.8 (H) 05/06/2021  ? LAMBDASER 3.7 (L) 05/06/2021  ? KAPLAMBRATIO 39.54 (H) 05/14/2021  ? ?Lab Results  ?Component Value Date  ? IGGSERUM 480 (L) 05/06/2021  ? IGA 12 (L) 05/06/2021  ? IGMSERUM 10 (L) 05/06/2021  ? ?Lab Results  ?Component Value Date  ? TOTALPROTELP 6.0 05/06/2021  ? ALBUMINELP 3.6 05/06/2021  ? A1GS 0.3 05/06/2021  ? A2GS 0.9 05/06/2021  ? BETS 0.9 05/06/2021  ? BETA2SER 0.3 02/11/2015  ? GAMS 0.4 05/06/2021  ? MSPIKE Not Observed 05/06/2021  ? SPEI Comment 02/18/2021  ? ?  Chemistry   ?   ?Component  Value Date/Time  ? NA 139 06/08/2021 1152  ? NA 145 (H) 06/28/2019 1501  ? NA 140 01/27/2017 1044  ? NA 141 06/05/2015 1036  ? K 4.5 06/08/2021 1152  ? K 3.3 01/27/2017 1044  ? K 2.9 (LL) 06/05/2015 1036  ? CL 105 04/

## 2021-06-09 ENCOUNTER — Telehealth: Payer: Self-pay | Admitting: *Deleted

## 2021-06-09 LAB — IGG, IGA, IGM
IgA: 11 mg/dL — ABNORMAL LOW (ref 61–437)
IgG (Immunoglobin G), Serum: 484 mg/dL — ABNORMAL LOW (ref 603–1613)
IgM (Immunoglobulin M), Srm: 10 mg/dL — ABNORMAL LOW (ref 15–143)

## 2021-06-09 LAB — KAPPA/LAMBDA LIGHT CHAINS
Kappa free light chain: 144.8 mg/L — ABNORMAL HIGH (ref 3.3–19.4)
Kappa, lambda light chain ratio: 46.71 — ABNORMAL HIGH (ref 0.26–1.65)
Lambda free light chains: 3.1 mg/L — ABNORMAL LOW (ref 5.7–26.3)

## 2021-06-09 NOTE — Telephone Encounter (Signed)
Received a call from [facility] in [place] stating that their is an interaction involving decreasing anti platelet action of plavix by the Nexium.  This was reviewed by Dr Marin Olp and is ok to continue treatment.  Beth notified that this is ok.  Also questioned if Xarelto was given for AFib by cardiology.  Unsure of this as we prescribed for DVT/PE. Dr Darin Engels office notified to see if patient needs to be on Xarelto due to Atrial Fibrillation.  [place] is the contact at Dr Darin Engels office.  She will contact us when she gets an answer.  Will notify Beth from [facility] when an answer is received.   ? ?

## 2021-06-10 ENCOUNTER — Encounter: Payer: Self-pay | Admitting: Hematology & Oncology

## 2021-06-10 ENCOUNTER — Ambulatory Visit (INDEPENDENT_AMBULATORY_CARE_PROVIDER_SITE_OTHER): Payer: Medicare Other

## 2021-06-10 ENCOUNTER — Telehealth: Payer: Self-pay

## 2021-06-10 ENCOUNTER — Other Ambulatory Visit: Payer: Self-pay | Admitting: Sports Medicine

## 2021-06-10 ENCOUNTER — Ambulatory Visit (INDEPENDENT_AMBULATORY_CARE_PROVIDER_SITE_OTHER): Payer: Medicare Other | Admitting: Sports Medicine

## 2021-06-10 DIAGNOSIS — G8929 Other chronic pain: Secondary | ICD-10-CM

## 2021-06-10 DIAGNOSIS — M19011 Primary osteoarthritis, right shoulder: Secondary | ICD-10-CM | POA: Diagnosis not present

## 2021-06-10 DIAGNOSIS — Z9861 Coronary angioplasty status: Secondary | ICD-10-CM

## 2021-06-10 DIAGNOSIS — M5030 Other cervical disc degeneration, unspecified cervical region: Secondary | ICD-10-CM

## 2021-06-10 DIAGNOSIS — M47815 Spondylosis without myelopathy or radiculopathy, thoracolumbar region: Secondary | ICD-10-CM

## 2021-06-10 DIAGNOSIS — I2510 Atherosclerotic heart disease of native coronary artery without angina pectoris: Secondary | ICD-10-CM | POA: Diagnosis not present

## 2021-06-10 DIAGNOSIS — M25511 Pain in right shoulder: Secondary | ICD-10-CM

## 2021-06-10 LAB — PROTEIN ELECTROPHORESIS, SERUM, WITH REFLEX
A/G Ratio: 1.7 (ref 0.7–1.7)
Albumin ELP: 3.8 g/dL (ref 2.9–4.4)
Alpha-1-Globulin: 0.2 g/dL (ref 0.0–0.4)
Alpha-2-Globulin: 0.9 g/dL (ref 0.4–1.0)
Beta Globulin: 0.8 g/dL (ref 0.7–1.3)
Gamma Globulin: 0.4 g/dL (ref 0.4–1.8)
Globulin, Total: 2.2 g/dL (ref 2.2–3.9)
Total Protein ELP: 6 g/dL (ref 6.0–8.5)

## 2021-06-10 MED ORDER — OXYCODONE-ACETAMINOPHEN 10-325 MG PO TABS: 1.0000 | ORAL_TABLET | Freq: Three times a day (TID) | ORAL | 0 refills | Status: DC | PRN

## 2021-06-10 NOTE — Progress Notes (Signed)
? ? ?  Procedures performed today:   ? ?Procedure: Real-time Ultrasound Guided injection of the right glenohumeral joint ?Device: Samsung HS60  ?Verbal informed consent obtained.  ?Time-out conducted.  ?Noted no overlying erythema, induration, or other signs of local infection.  ?Skin prepped in a sterile fashion.  ?Local anesthesia: Topical Ethyl chloride.  ?With sterile technique and under real time ultrasound guidance: Noted arthritic joint, 1 cc Kenalog 40, 2 cc lidocaine, 2 cc bupivacaine injected easily ?Completed without difficulty  ?Advised to call if fevers/chills, erythema, induration, drainage, or persistent bleeding.  ?Images permanently stored and available for review in PACS.  ?Impression: Technically successful ultrasound guided injection. ? ?Independent interpretation of notes and tests performed by another provider:  ? ?None. ? ?Brief History, Exam, Impression, and Recommendations:   ? ?Primary osteoarthritis of right shoulder ?Glenohumeral injection today, x-rays, return to see me in 6 weeks. ? 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 ?He can continue his oxycodone for now. ?Return to see me 6 weeks after his injection ? ?Degenerative disc disease, cervical ?Known cervical DDD, he did some epidurals back in 2017 which worked very well, not yet ready to consider cervical intervention, we will do his lumbar for significant persistent discomfort we will add cervical epidurals. ? ? ? ?___________________________________________ ?[name] [name]. Dianah Field, M.D., ABFM., CAQSM. ?Primary Care and Sports Medicine ?[place] ? ?Adjunct Instructor of Family Medicine  ?[employer] ?

## 2021-06-10 NOTE — Assessment & Plan Note (Signed)
Glenohumeral injection today, x-rays, return to see me in 6 weeks. ?

## 2021-06-10 NOTE — Telephone Encounter (Signed)
Advised via MyChart.

## 2021-06-10 NOTE — Telephone Encounter (Signed)
-----   Message from [name], MD sent at 06/09/2021  4:28 PM EDT ----- ?Call and let him know that the Kappa light chains are up a little bit.  However, he has not been on the Xpovio all that long.  I am not worried as of yet.  Thanks.  Pete ?

## 2021-06-10 NOTE — Assessment & Plan Note (Signed)
Known cervical DDD, he did some epidurals back in 2017 which worked very well, not yet ready to consider cervical intervention, we will do his lumbar for significant persistent discomfort we will add cervical epidurals. ?

## 2021-06-10 NOTE — Assessment & Plan Note (Signed)
[name] [name] does have multilevel lumbar spondylosis with spinal stenosis and facet arthritis, he has not had an epidural in a while, MRI is about 81 years old, I would like to get him another epidural, L4-L5 interlaminar. ?He can continue his oxycodone for now. ?Return to see me 6 weeks after his injection ?

## 2021-06-16 ENCOUNTER — Other Ambulatory Visit: Payer: Self-pay | Admitting: *Deleted

## 2021-06-16 MED ORDER — SELINEXOR (80 MG ONCE WEEKLY) 40 MG PO TBPK: 80.0000 mg | ORAL_TABLET | ORAL | 5 refills | Status: DC

## 2021-06-18 DIAGNOSIS — Z20822 Contact with and (suspected) exposure to covid-19: Secondary | ICD-10-CM | POA: Diagnosis not present

## 2021-06-24 DIAGNOSIS — I2510 Atherosclerotic heart disease of native coronary artery without angina pectoris: Secondary | ICD-10-CM | POA: Diagnosis not present

## 2021-06-28 DIAGNOSIS — K219 Gastro-esophageal reflux disease without esophagitis: Secondary | ICD-10-CM | POA: Diagnosis not present

## 2021-06-28 DIAGNOSIS — I447 Left bundle-branch block, unspecified: Secondary | ICD-10-CM | POA: Diagnosis not present

## 2021-06-28 DIAGNOSIS — J168 Pneumonia due to other specified infectious organisms: Secondary | ICD-10-CM | POA: Diagnosis not present

## 2021-06-28 DIAGNOSIS — D849 Immunodeficiency, unspecified: Secondary | ICD-10-CM | POA: Diagnosis not present

## 2021-06-28 DIAGNOSIS — Z87891 Personal history of nicotine dependence: Secondary | ICD-10-CM | POA: Diagnosis not present

## 2021-06-28 DIAGNOSIS — Z888 Allergy status to other drugs, medicaments and biological substances status: Secondary | ICD-10-CM | POA: Diagnosis not present

## 2021-06-28 DIAGNOSIS — I130 Hypertensive heart and chronic kidney disease with heart failure and stage 1 through stage 4 chronic kidney disease, or unspecified chronic kidney disease: Secondary | ICD-10-CM | POA: Diagnosis not present

## 2021-06-28 DIAGNOSIS — G4733 Obstructive sleep apnea (adult) (pediatric): Secondary | ICD-10-CM | POA: Diagnosis not present

## 2021-06-28 DIAGNOSIS — J440 Chronic obstructive pulmonary disease with acute lower respiratory infection: Secondary | ICD-10-CM | POA: Diagnosis not present

## 2021-06-28 DIAGNOSIS — N1831 Chronic kidney disease, stage 3a: Secondary | ICD-10-CM | POA: Diagnosis not present

## 2021-06-28 DIAGNOSIS — N183 Chronic kidney disease, stage 3 unspecified: Secondary | ICD-10-CM | POA: Diagnosis not present

## 2021-06-28 DIAGNOSIS — I10 Essential (primary) hypertension: Secondary | ICD-10-CM | POA: Diagnosis not present

## 2021-06-28 DIAGNOSIS — Z7901 Long term (current) use of anticoagulants: Secondary | ICD-10-CM | POA: Diagnosis not present

## 2021-06-28 DIAGNOSIS — Z66 Do not resuscitate: Secondary | ICD-10-CM | POA: Diagnosis not present

## 2021-06-28 DIAGNOSIS — I5022 Chronic systolic (congestive) heart failure: Secondary | ICD-10-CM | POA: Diagnosis not present

## 2021-06-28 DIAGNOSIS — I480 Paroxysmal atrial fibrillation: Secondary | ICD-10-CM | POA: Diagnosis not present

## 2021-06-28 DIAGNOSIS — Z20822 Contact with and (suspected) exposure to covid-19: Secondary | ICD-10-CM | POA: Diagnosis not present

## 2021-06-28 DIAGNOSIS — C9000 Multiple myeloma not having achieved remission: Secondary | ICD-10-CM | POA: Diagnosis not present

## 2021-06-28 DIAGNOSIS — J90 Pleural effusion, not elsewhere classified: Secondary | ICD-10-CM | POA: Diagnosis not present

## 2021-06-28 DIAGNOSIS — Z951 Presence of aortocoronary bypass graft: Secondary | ICD-10-CM | POA: Diagnosis not present

## 2021-06-28 DIAGNOSIS — G4730 Sleep apnea, unspecified: Secondary | ICD-10-CM | POA: Diagnosis not present

## 2021-06-28 DIAGNOSIS — J189 Pneumonia, unspecified organism: Secondary | ICD-10-CM | POA: Diagnosis not present

## 2021-06-28 DIAGNOSIS — C9002 Multiple myeloma in relapse: Secondary | ICD-10-CM | POA: Diagnosis not present

## 2021-06-28 DIAGNOSIS — Z886 Allergy status to analgesic agent status: Secondary | ICD-10-CM | POA: Diagnosis not present

## 2021-06-28 DIAGNOSIS — R509 Fever, unspecified: Secondary | ICD-10-CM | POA: Diagnosis not present

## 2021-06-28 DIAGNOSIS — E785 Hyperlipidemia, unspecified: Secondary | ICD-10-CM | POA: Diagnosis not present

## 2021-06-28 DIAGNOSIS — I428 Other cardiomyopathies: Secondary | ICD-10-CM | POA: Diagnosis not present

## 2021-06-28 DIAGNOSIS — N400 Enlarged prostate without lower urinary tract symptoms: Secondary | ICD-10-CM | POA: Diagnosis not present

## 2021-06-28 DIAGNOSIS — M7989 Other specified soft tissue disorders: Secondary | ICD-10-CM | POA: Diagnosis not present

## 2021-06-28 DIAGNOSIS — I2510 Atherosclerotic heart disease of native coronary artery without angina pectoris: Secondary | ICD-10-CM | POA: Diagnosis not present

## 2021-06-28 DIAGNOSIS — R918 Other nonspecific abnormal finding of lung field: Secondary | ICD-10-CM | POA: Diagnosis not present

## 2021-06-28 DIAGNOSIS — J438 Other emphysema: Secondary | ICD-10-CM | POA: Diagnosis not present

## 2021-06-28 DIAGNOSIS — Z885 Allergy status to narcotic agent status: Secondary | ICD-10-CM | POA: Diagnosis not present

## 2021-06-28 DIAGNOSIS — Z9484 Stem cells transplant status: Secondary | ICD-10-CM | POA: Diagnosis not present

## 2021-06-29 DIAGNOSIS — C9002 Multiple myeloma in relapse: Secondary | ICD-10-CM | POA: Diagnosis not present

## 2021-06-29 DIAGNOSIS — J189 Pneumonia, unspecified organism: Secondary | ICD-10-CM | POA: Diagnosis not present

## 2021-06-29 DIAGNOSIS — Z7901 Long term (current) use of anticoagulants: Secondary | ICD-10-CM | POA: Diagnosis not present

## 2021-06-29 DIAGNOSIS — J438 Other emphysema: Secondary | ICD-10-CM | POA: Diagnosis not present

## 2021-06-30 ENCOUNTER — Other Ambulatory Visit: Payer: Medicare Other

## 2021-07-07 ENCOUNTER — Other Ambulatory Visit: Payer: Medicare Other

## 2021-07-08 ENCOUNTER — Ambulatory Visit: Payer: Medicare Other | Admitting: Sports Medicine

## 2021-07-09 ENCOUNTER — Encounter: Payer: Self-pay | Admitting: Sports Medicine

## 2021-07-09 ENCOUNTER — Encounter: Payer: Self-pay | Admitting: Hematology & Oncology

## 2021-07-09 ENCOUNTER — Ambulatory Visit (INDEPENDENT_AMBULATORY_CARE_PROVIDER_SITE_OTHER): Payer: Medicare Other | Admitting: Sports Medicine

## 2021-07-09 ENCOUNTER — Inpatient Hospital Stay: Payer: Medicare Other | Attending: Hematology & Oncology

## 2021-07-09 ENCOUNTER — Inpatient Hospital Stay (HOSPITAL_BASED_OUTPATIENT_CLINIC_OR_DEPARTMENT_OTHER): Payer: Medicare Other | Admitting: Hematology & Oncology

## 2021-07-09 VITALS — BP 107/48 | HR 60 | Temp 97.4°F | Resp 20 | Wt 199.4 lb

## 2021-07-09 DIAGNOSIS — C9002 Multiple myeloma in relapse: Secondary | ICD-10-CM | POA: Insufficient documentation

## 2021-07-09 DIAGNOSIS — M47815 Spondylosis without myelopathy or radiculopathy, thoracolumbar region: Secondary | ICD-10-CM | POA: Diagnosis not present

## 2021-07-09 DIAGNOSIS — I2510 Atherosclerotic heart disease of native coronary artery without angina pectoris: Secondary | ICD-10-CM

## 2021-07-09 DIAGNOSIS — D509 Iron deficiency anemia, unspecified: Secondary | ICD-10-CM | POA: Insufficient documentation

## 2021-07-09 DIAGNOSIS — J189 Pneumonia, unspecified organism: Secondary | ICD-10-CM | POA: Diagnosis not present

## 2021-07-09 DIAGNOSIS — R197 Diarrhea, unspecified: Secondary | ICD-10-CM | POA: Diagnosis not present

## 2021-07-09 DIAGNOSIS — Z9861 Coronary angioplasty status: Secondary | ICD-10-CM | POA: Diagnosis not present

## 2021-07-09 LAB — CMP (CANCER CENTER ONLY)
ALT: 31 U/L (ref 0–44)
AST: 23 U/L (ref 15–41)
Albumin: 4.4 g/dL (ref 3.5–5.0)
Alkaline Phosphatase: 101 U/L (ref 38–126)
Anion gap: 12 (ref 5–15)
BUN: 34 mg/dL — ABNORMAL HIGH (ref 8–23)
CO2: 24 mmol/L (ref 22–32)
Calcium: 9.2 mg/dL (ref 8.9–10.3)
Chloride: 103 mmol/L (ref 98–111)
Creatinine: 1.44 mg/dL — ABNORMAL HIGH (ref 0.61–1.24)
GFR, Estimated: 49 mL/min — ABNORMAL LOW (ref >60)
Glucose, Bld: 95 mg/dL (ref 70–99)
Potassium: 4.1 mmol/L (ref 3.5–5.1)
Sodium: 139 mmol/L (ref 135–145)
Total Bilirubin: 0.5 mg/dL (ref 0.3–1.2)
Total Protein: 6.7 g/dL (ref 6.5–8.1)

## 2021-07-09 LAB — CBC WITH DIFFERENTIAL (CANCER CENTER ONLY)
Abs Immature Granulocytes: 0.02 10*3/uL (ref 0.00–0.07)
Basophils Absolute: 0 10*3/uL (ref 0.0–0.1)
Basophils Relative: 0 %
Eosinophils Absolute: 0 10*3/uL (ref 0.0–0.5)
Eosinophils Relative: 0 %
HCT: 34.7 % — ABNORMAL LOW (ref 39.0–52.0)
Hemoglobin: 11.3 g/dL — ABNORMAL LOW (ref 13.0–17.0)
Immature Granulocytes: 0 %
Lymphocytes Relative: 41 %
Lymphs Abs: 1.9 10*3/uL (ref 0.7–4.0)
MCH: 30.6 pg (ref 26.0–34.0)
MCHC: 32.6 g/dL (ref 30.0–36.0)
MCV: 94 fL (ref 80.0–100.0)
Monocytes Absolute: 0.4 10*3/uL (ref 0.1–1.0)
Monocytes Relative: 8 %
Neutro Abs: 2.3 10*3/uL (ref 1.7–7.7)
Neutrophils Relative %: 51 %
Platelet Count: 185 10*3/uL (ref 150–400)
RBC: 3.69 MIL/uL — ABNORMAL LOW (ref 4.22–5.81)
RDW: 15.3 % (ref 11.5–15.5)
WBC Count: 4.6 10*3/uL (ref 4.0–10.5)
nRBC: 0 % (ref 0.0–0.2)

## 2021-07-09 LAB — FERRITIN: Ferritin: 882 ng/mL — ABNORMAL HIGH (ref 24–336)

## 2021-07-09 LAB — LACTATE DEHYDROGENASE: LDH: 148 U/L (ref 98–192)

## 2021-07-09 MED ORDER — OXYCODONE HCL ER 20 MG PO T12A: 20.0000 mg | EXTENDED_RELEASE_TABLET | Freq: Every day | ORAL | 0 refills | Status: DC

## 2021-07-09 MED ORDER — OXYCODONE-ACETAMINOPHEN 10-325 MG PO TABS: ORAL_TABLET | ORAL | 0 refills | Status: DC

## 2021-07-09 NOTE — Progress Notes (Addendum)
    Procedures performed today:    None.  Independent interpretation of notes and tests performed by another provider:   None.  Brief History, Exam, Impression, and Recommendations:    Community acquired pneumonia [name] [name] returns, he is a very pleasant 81 year old male with active multiple myeloma currently on chemotherapy. He unfortunately was recently admitted to the hospital with a bilateral multifocal pneumonia, treated inpatient and then discharged on doxycycline and cefuroxime. He is feeling weak, but overall doing better. His primary complaint is back pain.   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 He has active multiple myeloma with multiple lytic lesions in the axial and appendicular skeleton. He recently had pneumonia. He was doing a lot of coughing initially, much better now but has significant mid, upper, and lower back pain. Currently on Percocet tens twice daily, still with significant pain. As he we are treating cancer related pain we are going to switch him to a daily and a breakthrough regimen, total oxycodone dose is 20 mg so we will switch him to OxyContin, with an additional Percocet to use as needed daily for breakthrough pain. Of note we may need to switch to [place] for insurance purposes, we will keep an eye out.  Update, switching to Xtampza 9mg  PO BID.    ___________________________________________ [name] [name]. [name] [name], M.D., ABFM., CAQSM. Primary Care and Sports Medicine [facility]  Adjunct Instructor of Family Medicine  [employer]

## 2021-07-09 NOTE — Assessment & Plan Note (Signed)
[name] [name] does have multilevel lumbar spondylosis, spinal stenosis, facet arthritis, he has had some epidurals. He has active multiple myeloma with multiple lytic lesions in the axial and appendicular skeleton. He recently had pneumonia. He was doing a lot of coughing initially, much better now but has significant mid, upper, and lower back pain. Currently on Percocet tens twice daily, still with significant pain. As he we are treating cancer related pain we are going to switch him to a daily and a breakthrough regimen, total oxycodone dose is 20 mg so we will switch him to OxyContin, with an additional Percocet to use as needed daily for breakthrough pain. Of note we may need to switch to [place] for insurance purposes, we will keep an eye out.  Update, switching to Xtampza $RemoveBe'9mg'[id number]$  PO BID.

## 2021-07-09 NOTE — Assessment & Plan Note (Signed)
[name] [name] returns, he is a very pleasant 80-year-old male with active multiple myeloma currently on chemotherapy. He unfortunately was recently admitted to the hospital with a bilateral multifocal pneumonia, treated inpatient and then discharged on doxycycline and cefuroxime. He is feeling weak, but overall doing better. His primary complaint is back pain.  

## 2021-07-09 NOTE — Progress Notes (Signed)
Hematology and Oncology Follow Up Visit  [name] [name] [id number] 04-04-1940 81 y.o. 07/09/2021   Principle Diagnosis:  IgG kappa myeloma - Relapsed -- 1q duplication Acute thromboembolism of the right lower leg Iron deficiency anemia   Past Therapy:        S/P ASCT at [facility] on 04/02/2015 Patient s/p cycle 5 of Velcade/Revlimid/Decadron Revlimid '10mg'$  po q day (21/28) -- d/c on 09/15/2017   Current Therapy:     Faspro/Velcade/Decadron -- s/p cycle 6- started 03/20/2020   -- d/c velcade and start Kyprolis on 09/27/2020 -- d/c on 01/29/2021 for CHF Zometa 4 mg IV every 2 months  - next dose due 08/2021 Xpovio 80 mg po q week -- start on 05/15/2021 IV iron-Monoferric given on 02/24/2021.   Interim History:  [name] [name] is here today for follow-up.  [name] [name] seems to be doing pretty well with the Xpovio.  [name] [name] does have some diarrhea with it.  However, [name] [name] does take some pain medication for his chronic back issues.  [name] [name] does get fatigued.  Again the fatigue usually is about 2 days after the Xpovio.  When we last checked his Kappa light chain in April, the level was 14.8 mg/dL.  [name] [name] has had problems with pneumonia.  [name] [name] was admitted over to [facility] a couple weeks ago.  [name] [name] was in for a couple days.  [name] [name] had pneumonia.  His IgG level over last checked was 483 mg/dL.  This is on the lower side.  We may have to think about supplemental IVIG if [name] [name] has more problems with infection.  [name] [name] has had no issues with bleeding.  [name] [name] has had no leg swelling.  [name] [name] has had a little bit of a headache.  Hopefully, [name] [name] will be able to have a nice cookout this weekend with friends and family.  Overall, I would say that his performance status is probably ECOG 1.   Medications:  Allergies as of 07/09/2021       Reactions   Budesonide-formoterol Fumarate Itching, Other (See Comments)   Codeine Itching   Hydrocodone-acetaminophen Itching   Mushroom Extract Complex Nausea And Vomiting, Other (See Comments)   Only  shitake mushroom  causes this reaction. Flu-like symptoms from portabello mushrooms        Medication List        Accurate as of Jul 09, 2021 12:20 PM. If you have any questions, ask your nurse or doctor.          cetirizine 10 MG tablet Commonly known as: ZYRTEC TAKE 1 TABLET EVERY DAY   Cyanocobalamin 1000 MCG Tbcr Take 1 tablet by mouth daily.   dexamethasone 4 MG tablet Commonly known as: DECADRON TAKE 5 TABLETS ONCE WEEKLY WITH XPOVIO   empagliflozin 10 MG Tabs tablet Commonly known as: JARDIANCE Take by mouth daily.   esomeprazole 40 MG capsule Commonly known as: NEXIUM TAKE 1 CAPSULE EVERY DAY AT 12:00 PM   famciclovir 500 MG tablet Commonly known as: FAMVIR TAKE 1 TABLET (500 MG TOTAL) BY MOUTH DAILY.   fluticasone 50 MCG/ACT nasal spray Commonly known as: FLONASE Place 2 sprays into both nostrils daily.   furosemide 20 MG tablet Commonly known as: LASIX Take 20 mg by mouth daily as needed.   gabapentin 600 MG tablet Commonly known as: NEURONTIN TAKE 1 TABLET THREE TIMES DAILY   levothyroxine 100 MCG tablet Commonly known as: SYNTHROID TAKE 1 TABLET EVERY DAY BEFORE BREAKFAST   metoprolol succinate 50 MG 24 hr tablet Commonly known as: TOPROL-XL  Take 50 mg by mouth daily.   nitroGLYCERIN 0.4 MG SL tablet Commonly known as: NITROSTAT PLACE 1 TABLET UNDER THE TONGUE EVERY 5 (FIVE) MINUTES AS NEEDED FOR CHEST PAIN AS DIRECTED   ondansetron 8 MG tablet Commonly known as: ZOFRAN Take 1 tablet (8 mg total) by mouth every 8 (eight) hours as needed for nausea or vomiting.   oxyCODONE-acetaminophen 10-325 MG tablet Commonly known as: PERCOCET Take 1 tablet by mouth every 8 (eight) hours as needed for pain. for pain   potassium chloride SA 20 MEQ tablet Commonly known as: KLOR-CON M TAKE 1 TABLET TWICE DAILY   prochlorperazine 10 MG tablet Commonly known as: COMPAZINE Take 1 tablet (10 mg total) by mouth every 6 (six) hours as needed (Nausea  or vomiting).   pyridoxine 100 MG tablet Commonly known as: B-6 Take 100 mg by mouth daily.   Repatha SureClick 831 MG/ML Soaj Generic drug: Evolocumab Inject 140 mg into the skin every 14 (fourteen) days.   sacubitril-valsartan 97-103 MG Commonly known as: ENTRESTO Take 1 tablet by mouth 2 (two) times daily.   selinexor Therapy Pack (80 mg once weekly) Commonly known as: Xpovio (80 MG Once Weekly) Take 2 tablets (80 mg total) by mouth once a week.   tamsulosin 0.4 MG Caps capsule Commonly known as: FLOMAX Take 1 capsule (0.4 mg total) by mouth daily after supper.   Xarelto 15 MG Tabs tablet Generic drug: Rivaroxaban Take by mouth daily. What changed: Another medication with the same name was removed. Continue taking this medication, and follow the directions you see here. Changed by: [name], MD   Zoledronic Acid 4 MG Solr Inject into the vein.        Allergies:  Allergies  Allergen Reactions   Budesonide-Formoterol Fumarate Itching and Other (See Comments)   Codeine Itching   Hydrocodone-Acetaminophen Itching   Mushroom Extract Complex Nausea And Vomiting and Other (See Comments)    Only shitake mushroom  causes this reaction. Flu-like symptoms from portabello mushrooms    Past Medical History, Surgical history, Social history, and Family History were reviewed and updated.  Review of Systems: Review of Systems  Constitutional: Negative.   HENT: Negative.    Eyes: Negative.   Respiratory: Negative.    Cardiovascular:  Positive for palpitations.  Gastrointestinal: Negative.   Genitourinary:  Positive for frequency and urgency.  Musculoskeletal:  Positive for back pain.  Skin: Negative.   Neurological: Negative.   Endo/Heme/Allergies: Negative.   Psychiatric/Behavioral: Negative.      Physical Exam:  weight is 199 lb 6.4 oz (90.4 kg). His oral temperature is 97.4 F (36.3 C) (abnormal). His blood pressure is 107/48 (abnormal) and his pulse is  60. His respiration is 20 and oxygen saturation is 99%.   Wt Readings from Last 3 Encounters:  07/09/21 199 lb 6.4 oz (90.4 kg)  06/08/21 206 lb 4 oz (93.6 kg)  05/06/21 208 lb 0.6 oz (94.4 kg)    Physical Exam Vitals reviewed.  HENT:     Head: Normocephalic and atraumatic.  Eyes:     Pupils: Pupils are equal, round, and reactive to light.  Cardiovascular:     Rate and Rhythm: Normal rate and regular rhythm.     Heart sounds: Normal heart sounds.  Pulmonary:     Effort: Pulmonary effort is normal.     Breath sounds: Normal breath sounds.  Abdominal:     General: Bowel sounds are normal.     Palpations: Abdomen is soft.  Musculoskeletal:        General: No tenderness or deformity. Normal range of motion.     Cervical back: Normal range of motion.  Lymphadenopathy:     Cervical: No cervical adenopathy.  Skin:    General: Skin is warm and dry.     Findings: No erythema or rash.  Neurological:     Mental Status: [name] [name] is alert and oriented to person, place, and time.  Psychiatric:        Behavior: Behavior normal.        Thought Content: Thought content normal.        Judgment: Judgment normal.    Lab Results  Component Value Date   WBC 4.6 07/09/2021   HGB 11.3 (L) 07/09/2021   HCT 34.7 (L) 07/09/2021   MCV 94.0 07/09/2021   PLT 185 07/09/2021   Lab Results  Component Value Date   FERRITIN 433 (H) 06/08/2021   IRON 62 06/08/2021   TIBC 280 06/08/2021   UIBC 218 06/08/2021   IRONPCTSAT 22 06/08/2021   Lab Results  Component Value Date   RETICCTPCT 1.2 05/06/2021   RBC 3.69 (L) 07/09/2021   Lab Results  Component Value Date   KPAFRELGTCHN 144.8 (H) 06/08/2021   LAMBDASER 3.1 (L) 06/08/2021   KAPLAMBRATIO 46.71 (H) 06/08/2021   Lab Results  Component Value Date   IGGSERUM 484 (L) 06/08/2021   IGA 11 (L) 06/08/2021   IGMSERUM 10 (L) 06/08/2021   Lab Results  Component Value Date   TOTALPROTELP 6.0 06/08/2021   ALBUMINELP 3.8 06/08/2021   A1GS 0.2  06/08/2021   A2GS 0.9 06/08/2021   BETS 0.8 06/08/2021   BETA2SER 0.3 02/11/2015   GAMS 0.4 06/08/2021   MSPIKE Not Observed 06/08/2021   SPEI Comment 02/18/2021     Chemistry      Component Value Date/Time   NA 139 07/09/2021 1138   NA 145 (H) 06/28/2019 1501   NA 140 01/27/2017 1044   NA 141 06/05/2015 1036   K 4.1 07/09/2021 1138   K 3.3 01/27/2017 1044   K 2.9 (LL) 06/05/2015 1036   CL 103 07/09/2021 1138   CL 101 01/27/2017 1044   CO2 24 07/09/2021 1138   CO2 28 01/27/2017 1044   CO2 27 06/05/2015 1036   BUN 34 (H) 07/09/2021 1138   BUN 15 06/28/2019 1501   BUN 14 01/27/2017 1044   BUN 13.1 06/05/2015 1036   CREATININE 1.44 (H) 07/09/2021 1138   CREATININE 2.49 (H) 11/25/2020 0000   CREATININE 1.0 06/05/2015 1036   GLU 152 03/30/2016 0000      Component Value Date/Time   CALCIUM 9.2 07/09/2021 1138   CALCIUM 8.9 01/27/2017 1044   CALCIUM 9.4 06/05/2015 1036   ALKPHOS 101 07/09/2021 1138   ALKPHOS 126 (H) 01/27/2017 1044   ALKPHOS 94 06/05/2015 1036   AST 23 07/09/2021 1138   AST 22 06/05/2015 1036   ALT 31 07/09/2021 1138   ALT 33 01/27/2017 1044   ALT 18 06/05/2015 1036   BILITOT 0.5 07/09/2021 1138   BILITOT 0.59 06/05/2015 1036       Impression and Plan: [name] [name] is a very pleasant 81 yo caucasian gentleman with history of IgG kappa myeloma. [name] [name] underwent standard induction chemotherapy and then ultimately stem cell transplant at [facility] in February 2017.   [name] [name] had recurrence with kappa light chain disease.  [name] [name] had been on Kyprolis.  [name] [name] apparently had some cardiac issues.  I suspect that the Kyprolis  probably was a contributing factor.   We now have him on Xpovio.  We have him on single agent Xpovio.  [name] [name] has had 5 doses of the Xpovio.  Hopefully, we will be seeing a decrease in his light chain.  If not, we may have to think about adding something to this.  We could always make another change and get him on elotuzimab.  We will always consider him for  CAR-T therapy but his cardiac status may be a problem.     Again, we will have to be very cautious with his pneumonia.  I do want to hold off on IVIG.  We will see what his IgG level is this visit.  We will plan to get him back in another 4 weeks.   [name], MD 5/26/202312:20 PM

## 2021-07-12 ENCOUNTER — Encounter: Payer: Self-pay | Admitting: Sports Medicine

## 2021-07-12 DIAGNOSIS — M5030 Other cervical disc degeneration, unspecified cervical region: Secondary | ICD-10-CM

## 2021-07-12 DIAGNOSIS — H1132 Conjunctival hemorrhage, left eye: Secondary | ICD-10-CM | POA: Diagnosis not present

## 2021-07-12 DIAGNOSIS — Z87891 Personal history of nicotine dependence: Secondary | ICD-10-CM | POA: Diagnosis not present

## 2021-07-12 LAB — IGG, IGA, IGM
IgA: 10 mg/dL — ABNORMAL LOW (ref 61–437)
IgG (Immunoglobin G), Serum: 496 mg/dL — ABNORMAL LOW (ref 603–1613)
IgM (Immunoglobulin M), Srm: 10 mg/dL — ABNORMAL LOW (ref 15–143)

## 2021-07-13 ENCOUNTER — Encounter: Payer: Self-pay | Admitting: Sports Medicine

## 2021-07-13 ENCOUNTER — Telehealth: Payer: Self-pay

## 2021-07-13 DIAGNOSIS — H1133 Conjunctival hemorrhage, bilateral: Secondary | ICD-10-CM | POA: Diagnosis not present

## 2021-07-13 LAB — IRON AND IRON BINDING CAPACITY (CC-WL,HP ONLY)
Iron: 112 ug/dL (ref 45–182)
Saturation Ratios: 41 % — ABNORMAL HIGH (ref 17.9–39.5)
TIBC: 274 ug/dL (ref 250–450)
UIBC: 162 ug/dL (ref 117–376)

## 2021-07-13 MED ORDER — XTAMPZA ER 9 MG PO C12A: 1.0000 | EXTENDED_RELEASE_CAPSULE | Freq: Two times a day (BID) | ORAL | 0 refills | Status: DC

## 2021-07-13 NOTE — Telephone Encounter (Addendum)
Initiated Prior authorization LKT:[id number] '20MG'$  er tablets Via: Covermymeds Case/Key: [id number] Status: denied  as of 5/30/2 Reason:The drug you asked for is not listed in your Drug guide (formulary). We cover a drug with the same active ingredient: Xtampza ER capsules . Md notified , medication has been changed  Notified Pt via: Mychart

## 2021-07-13 NOTE — Addendum Note (Signed)
Addended by: Silverio Decamp on: 07/13/2021 05:35 PM   Modules accepted: Orders

## 2021-07-14 LAB — KAPPA/LAMBDA LIGHT CHAINS
Kappa free light chain: 140 mg/L — ABNORMAL HIGH (ref 3.3–19.4)
Kappa, lambda light chain ratio: 53.85 — ABNORMAL HIGH (ref 0.26–1.65)
Lambda free light chains: 2.6 mg/L — ABNORMAL LOW (ref 5.7–26.3)

## 2021-07-15 ENCOUNTER — Other Ambulatory Visit: Payer: Medicare Other

## 2021-07-15 LAB — PROTEIN ELECTROPHORESIS, SERUM, WITH REFLEX
A/G Ratio: 1.7 (ref 0.7–1.7)
Albumin ELP: 3.8 g/dL (ref 2.9–4.4)
Alpha-1-Globulin: 0.2 g/dL (ref 0.0–0.4)
Alpha-2-Globulin: 0.9 g/dL (ref 0.4–1.0)
Beta Globulin: 0.8 g/dL (ref 0.7–1.3)
Gamma Globulin: 0.4 g/dL (ref 0.4–1.8)
Globulin, Total: 2.3 g/dL (ref 2.2–3.9)
M-Spike, %: 0.1 g/dL — ABNORMAL HIGH
SPEP Interpretation: 0
Total Protein ELP: 6.1 g/dL (ref 6.0–8.5)

## 2021-07-15 LAB — IMMUNOFIXATION REFLEX, SERUM
IgA: 6 mg/dL — ABNORMAL LOW (ref 61–437)
IgG (Immunoglobin G), Serum: 500 mg/dL — ABNORMAL LOW (ref 603–1613)
IgM (Immunoglobulin M), Srm: 8 mg/dL — ABNORMAL LOW (ref 15–143)

## 2021-07-20 ENCOUNTER — Ambulatory Visit
Admission: RE | Admit: 2021-07-20 | Discharge: 2021-07-20 | Disposition: A | Discharge status: Home / Self Care | Payer: Medicare Other | Source: Ambulatory Visit | Attending: Sports Medicine | Admitting: Sports Medicine

## 2021-07-20 DIAGNOSIS — M5030 Other cervical disc degeneration, unspecified cervical region: Secondary | ICD-10-CM

## 2021-07-20 DIAGNOSIS — M4722 Other spondylosis with radiculopathy, cervical region: Secondary | ICD-10-CM | POA: Diagnosis not present

## 2021-07-20 MED ORDER — TRIAMCINOLONE ACETONIDE 40 MG/ML IJ SUSP (RADIOLOGY)
60.0000 mg | Freq: Once | INTRAMUSCULAR | Status: AC
  Administered 2021-07-20: 60 mg via EPIDURAL

## 2021-07-20 MED ORDER — IOPAMIDOL (ISOVUE-M 300) INJECTION 61%
1.0000 mL | Freq: Once | INTRAMUSCULAR | Status: AC | PRN
  Administered 2021-07-20: 1 mL via EPIDURAL

## 2021-07-20 NOTE — Discharge Instructions (Signed)

## 2021-07-22 ENCOUNTER — Encounter: Payer: Self-pay | Admitting: Sports Medicine

## 2021-07-22 ENCOUNTER — Ambulatory Visit (INDEPENDENT_AMBULATORY_CARE_PROVIDER_SITE_OTHER): Payer: Medicare Other | Admitting: Sports Medicine

## 2021-07-22 DIAGNOSIS — M47815 Spondylosis without myelopathy or radiculopathy, thoracolumbar region: Secondary | ICD-10-CM

## 2021-07-22 DIAGNOSIS — M5030 Other cervical disc degeneration, unspecified cervical region: Secondary | ICD-10-CM | POA: Diagnosis not present

## 2021-07-22 DIAGNOSIS — H1132 Conjunctival hemorrhage, left eye: Secondary | ICD-10-CM | POA: Diagnosis not present

## 2021-07-22 DIAGNOSIS — I2510 Atherosclerotic heart disease of native coronary artery without angina pectoris: Secondary | ICD-10-CM | POA: Diagnosis not present

## 2021-07-22 DIAGNOSIS — Z9861 Coronary angioplasty status: Secondary | ICD-10-CM | POA: Diagnosis not present

## 2021-07-22 NOTE — Assessment & Plan Note (Signed)
Unclear etiology, woke up with the hematoma, was seen in the ED. No aggressive intervention needed. Vision normal, visible medial canthus of conjunctival hematoma is seen, otherwise normal extraocular motions and vision.

## 2021-07-22 NOTE — Assessment & Plan Note (Signed)
Laurance does have multilevel lumbar spondylosis, spinal stenosis, facet arthritis, he has had some epidurals, he also has active multiple myeloma with multiple lytic lesions in the axial and appendicular skeleton, he recently had a pneumonia, lots of coughing. However this improved. As we are treating some cancer related pain we switched him to Xtampza, 9 mg p.o. twice daily with a rapid acting oxycodone at night and another rapid acting oxycodone through the day, and this has controlled his pain extremely well, he feels better than he has felt before. We will proceed with another lumbar spine MRI, otherwise continue current medications.

## 2021-07-22 NOTE — Assessment & Plan Note (Signed)
No cervical DDD, last MRI was back in 2017, he was able to get another cervical epidural which seemed to work well though he will need an updated MRI for additional cervical epidurals.

## 2021-07-22 NOTE — Progress Notes (Signed)
    Procedures performed today:    None.  Independent interpretation of notes and tests performed by another provider:   None.  Brief History, Exam, Impression, and Recommendations:    Subconjunctival hematoma, left Unclear etiology, woke up with the hematoma, was seen in the ED. No aggressive intervention needed. Vision normal, visible medial canthus of conjunctival hematoma is seen, otherwise normal extraocular motions and vision.  Spondylosis of thoracolumbar region without myelopathy or radiculopathy [name] does have multilevel lumbar spondylosis, spinal stenosis, facet arthritis, he has had some epidurals, he also has active multiple myeloma with multiple lytic lesions in the axial and appendicular skeleton, he recently had a pneumonia, lots of coughing. However this improved. As we are treating some cancer related pain we switched him to Xtampza, 9 mg p.o. twice daily with a rapid acting oxycodone at night and another rapid acting oxycodone through the day, and this has controlled his pain extremely well, he feels better than he has felt before. We will proceed with another lumbar spine MRI, otherwise continue current medications.  Degenerative disc disease, cervical No cervical DDD, last MRI was back in 2017, he was able to get another cervical epidural which seemed to work well though he will need an updated MRI for additional cervical epidurals.    ___________________________________________ Gwen Her. Dianah Field, M.D., ABFM., CAQSM. Primary Care and [place] Instructor of [place] [employer] [facility] [employer]

## 2021-07-26 ENCOUNTER — Other Ambulatory Visit: Payer: Self-pay | Admitting: Hematology & Oncology

## 2021-07-26 DIAGNOSIS — C9000 Multiple myeloma not having achieved remission: Secondary | ICD-10-CM

## 2021-07-27 DIAGNOSIS — Z808 Family history of malignant neoplasm of other organs or systems: Secondary | ICD-10-CM | POA: Diagnosis not present

## 2021-07-27 DIAGNOSIS — L821 Other seborrheic keratosis: Secondary | ICD-10-CM | POA: Diagnosis not present

## 2021-07-27 DIAGNOSIS — D225 Melanocytic nevi of trunk: Secondary | ICD-10-CM | POA: Diagnosis not present

## 2021-07-27 DIAGNOSIS — L570 Actinic keratosis: Secondary | ICD-10-CM | POA: Diagnosis not present

## 2021-07-27 DIAGNOSIS — X32XXXA Exposure to sunlight, initial encounter: Secondary | ICD-10-CM | POA: Diagnosis not present

## 2021-07-27 DIAGNOSIS — D2239 Melanocytic nevi of other parts of face: Secondary | ICD-10-CM | POA: Diagnosis not present

## 2021-07-27 DIAGNOSIS — L814 Other melanin hyperpigmentation: Secondary | ICD-10-CM | POA: Diagnosis not present

## 2021-07-31 ENCOUNTER — Ambulatory Visit (INDEPENDENT_AMBULATORY_CARE_PROVIDER_SITE_OTHER): Payer: Medicare Other

## 2021-07-31 DIAGNOSIS — M5126 Other intervertebral disc displacement, lumbar region: Secondary | ICD-10-CM | POA: Diagnosis not present

## 2021-07-31 DIAGNOSIS — M47812 Spondylosis without myelopathy or radiculopathy, cervical region: Secondary | ICD-10-CM | POA: Diagnosis not present

## 2021-07-31 DIAGNOSIS — M5030 Other cervical disc degeneration, unspecified cervical region: Secondary | ICD-10-CM

## 2021-07-31 DIAGNOSIS — M4312 Spondylolisthesis, cervical region: Secondary | ICD-10-CM | POA: Diagnosis not present

## 2021-07-31 DIAGNOSIS — M47815 Spondylosis without myelopathy or radiculopathy, thoracolumbar region: Secondary | ICD-10-CM

## 2021-07-31 DIAGNOSIS — M545 Low back pain, unspecified: Secondary | ICD-10-CM | POA: Diagnosis not present

## 2021-07-31 DIAGNOSIS — M5137 Other intervertebral disc degeneration, lumbosacral region: Secondary | ICD-10-CM | POA: Diagnosis not present

## 2021-08-04 ENCOUNTER — Other Ambulatory Visit: Payer: Self-pay | Admitting: Sports Medicine

## 2021-08-04 DIAGNOSIS — N138 Other obstructive and reflux uropathy: Secondary | ICD-10-CM

## 2021-08-05 ENCOUNTER — Ambulatory Visit
Admission: RE | Admit: 2021-08-05 | Discharge: 2021-08-05 | Disposition: A | Discharge status: Home / Self Care | Payer: Medicare Other | Source: Ambulatory Visit | Attending: Sports Medicine | Admitting: Sports Medicine

## 2021-08-05 DIAGNOSIS — M47815 Spondylosis without myelopathy or radiculopathy, thoracolumbar region: Secondary | ICD-10-CM

## 2021-08-05 DIAGNOSIS — M47817 Spondylosis without myelopathy or radiculopathy, lumbosacral region: Secondary | ICD-10-CM | POA: Diagnosis not present

## 2021-08-05 MED ORDER — METHYLPREDNISOLONE ACETATE 40 MG/ML INJ SUSP (RADIOLOG
80.0000 mg | Freq: Once | INTRAMUSCULAR | Status: AC
  Administered 2021-08-05: 80 mg via EPIDURAL

## 2021-08-05 MED ORDER — IOPAMIDOL (ISOVUE-M 200) INJECTION 41%
1.0000 mL | Freq: Once | INTRAMUSCULAR | Status: AC
  Administered 2021-08-05: 1 mL via EPIDURAL

## 2021-08-05 NOTE — Discharge Instructions (Signed)

## 2021-08-06 ENCOUNTER — Other Ambulatory Visit: Payer: Self-pay | Admitting: Hematology & Oncology

## 2021-08-06 DIAGNOSIS — C9000 Multiple myeloma not having achieved remission: Secondary | ICD-10-CM

## 2021-08-09 ENCOUNTER — Other Ambulatory Visit: Payer: Self-pay | Admitting: Sports Medicine

## 2021-08-09 DIAGNOSIS — M47815 Spondylosis without myelopathy or radiculopathy, thoracolumbar region: Secondary | ICD-10-CM

## 2021-08-09 MED ORDER — OXYCODONE-ACETAMINOPHEN 10-325 MG PO TABS: ORAL_TABLET | ORAL | 0 refills | Status: DC

## 2021-08-09 MED ORDER — XTAMPZA ER 9 MG PO C12A: 1.0000 | EXTENDED_RELEASE_CAPSULE | Freq: Two times a day (BID) | ORAL | 0 refills | Status: DC

## 2021-08-13 ENCOUNTER — Inpatient Hospital Stay: Payer: Medicare Other | Attending: Hematology & Oncology

## 2021-08-13 ENCOUNTER — Inpatient Hospital Stay: Payer: Medicare Other

## 2021-08-13 ENCOUNTER — Inpatient Hospital Stay (HOSPITAL_BASED_OUTPATIENT_CLINIC_OR_DEPARTMENT_OTHER): Payer: Medicare Other | Admitting: Hematology & Oncology

## 2021-08-13 ENCOUNTER — Encounter: Payer: Self-pay | Admitting: Hematology & Oncology

## 2021-08-13 ENCOUNTER — Other Ambulatory Visit: Payer: Self-pay

## 2021-08-13 VITALS — BP 104/38 | HR 59 | Temp 97.9°F | Resp 18 | Ht 72.0 in | Wt 198.0 lb

## 2021-08-13 VITALS — BP 101/36 | HR 57 | Resp 18

## 2021-08-13 DIAGNOSIS — M898X9 Other specified disorders of bone, unspecified site: Secondary | ICD-10-CM

## 2021-08-13 DIAGNOSIS — C9002 Multiple myeloma in relapse: Secondary | ICD-10-CM

## 2021-08-13 DIAGNOSIS — D509 Iron deficiency anemia, unspecified: Secondary | ICD-10-CM | POA: Diagnosis not present

## 2021-08-13 DIAGNOSIS — D500 Iron deficiency anemia secondary to blood loss (chronic): Secondary | ICD-10-CM

## 2021-08-13 DIAGNOSIS — C9000 Multiple myeloma not having achieved remission: Secondary | ICD-10-CM

## 2021-08-13 DIAGNOSIS — I509 Heart failure, unspecified: Secondary | ICD-10-CM | POA: Diagnosis not present

## 2021-08-13 DIAGNOSIS — R197 Diarrhea, unspecified: Secondary | ICD-10-CM | POA: Diagnosis not present

## 2021-08-13 DIAGNOSIS — M899 Disorder of bone, unspecified: Secondary | ICD-10-CM

## 2021-08-13 LAB — CMP (CANCER CENTER ONLY)
ALT: 34 U/L (ref 0–44)
AST: 13 U/L — ABNORMAL LOW (ref 15–41)
Albumin: 4.2 g/dL (ref 3.5–5.0)
Alkaline Phosphatase: 73 U/L (ref 38–126)
Anion gap: 10 (ref 5–15)
BUN: 38 mg/dL — ABNORMAL HIGH (ref 8–23)
CO2: 27 mmol/L (ref 22–32)
Calcium: 9 mg/dL (ref 8.9–10.3)
Chloride: 100 mmol/L (ref 98–111)
Creatinine: 1.36 mg/dL — ABNORMAL HIGH (ref 0.61–1.24)
GFR, Estimated: 53 mL/min — ABNORMAL LOW (ref >60)
Glucose, Bld: 123 mg/dL — ABNORMAL HIGH (ref 70–99)
Potassium: 3.9 mmol/L (ref 3.5–5.1)
Sodium: 137 mmol/L (ref 135–145)
Total Bilirubin: 0.8 mg/dL (ref 0.3–1.2)
Total Protein: 6.4 g/dL — ABNORMAL LOW (ref 6.5–8.1)

## 2021-08-13 LAB — IRON AND IRON BINDING CAPACITY (CC-WL,HP ONLY)
Iron: 76 ug/dL (ref 45–182)
Saturation Ratios: 28 % (ref 17.9–39.5)
TIBC: 276 ug/dL (ref 250–450)
UIBC: 200 ug/dL (ref 117–376)

## 2021-08-13 LAB — CBC WITH DIFFERENTIAL (CANCER CENTER ONLY)
Abs Immature Granulocytes: 0.04 10*3/uL (ref 0.00–0.07)
Basophils Absolute: 0 10*3/uL (ref 0.0–0.1)
Basophils Relative: 0 %
Eosinophils Absolute: 0 10*3/uL (ref 0.0–0.5)
Eosinophils Relative: 0 %
HCT: 30.3 % — ABNORMAL LOW (ref 39.0–52.0)
Hemoglobin: 9.8 g/dL — ABNORMAL LOW (ref 13.0–17.0)
Immature Granulocytes: 1 %
Lymphocytes Relative: 26 %
Lymphs Abs: 1.3 10*3/uL (ref 0.7–4.0)
MCH: 31.5 pg (ref 26.0–34.0)
MCHC: 32.3 g/dL (ref 30.0–36.0)
MCV: 97.4 fL (ref 80.0–100.0)
Monocytes Absolute: 0.5 10*3/uL (ref 0.1–1.0)
Monocytes Relative: 9 %
Neutro Abs: 3.2 10*3/uL (ref 1.7–7.7)
Neutrophils Relative %: 64 %
Platelet Count: 142 10*3/uL — ABNORMAL LOW (ref 150–400)
RBC: 3.11 MIL/uL — ABNORMAL LOW (ref 4.22–5.81)
RDW: 18.3 % — ABNORMAL HIGH (ref 11.5–15.5)
WBC Count: 5.1 10*3/uL (ref 4.0–10.5)
nRBC: 0.6 % — ABNORMAL HIGH (ref 0.0–0.2)

## 2021-08-13 LAB — FERRITIN: Ferritin: 752 ng/mL — ABNORMAL HIGH (ref 24–336)

## 2021-08-13 LAB — LACTATE DEHYDROGENASE: LDH: 137 U/L (ref 98–192)

## 2021-08-13 MED ORDER — ZOLEDRONIC ACID 4 MG/100ML IV SOLN
4.0000 mg | Freq: Once | INTRAVENOUS | Status: AC
  Administered 2021-08-13: 4 mg via INTRAVENOUS
  Filled 2021-08-13: qty 100

## 2021-08-13 MED ORDER — SODIUM CHLORIDE 0.9 % IV SOLN: INTRAVENOUS | Status: DC

## 2021-08-13 NOTE — Progress Notes (Signed)
Hematology and Oncology Follow Up Visit  [name] [name] [id number] 08-Jul-1940 81 y.o. 08/13/2021   Principle Diagnosis:  IgG kappa myeloma - Relapsed -- 1q duplication Acute thromboembolism of the right lower leg Iron deficiency anemia   Past Therapy:        S/P ASCT at [place] on 04/02/2015 Patient s/p cycle 5 of Velcade/Revlimid/Decadron Revlimid '10mg'$  po q day (21/28) -- d/c on 09/15/2017   Current Therapy:     Faspro/Velcade/Decadron -- s/p cycle 6- started 03/20/2020   -- d/c velcade and start Kyprolis on 09/27/2020 -- d/c on 01/29/2021 for CHF Zometa 4 mg IV every 2 months  - next dose due 10/2021 Xpovio 80 mg po q week -- start on 05/15/2021 IV iron-Monoferric given on 02/24/2021.   Interim History:  [name] [name] is here today for follow-up.  Unfortunately, this to be the last time that he will be able to come in to see [place].  He is at the point now where it is can be a lot easier for him to be seen at [facility] with one of the oncologist there.  I totally understand this.  I just want what is easiest for him.  I know that this is becoming more of a journey for him to come to see [place].  He lives close to the cancer center over in [facility].  We will see what we can do to make the transfer over.  I do think that he is responding to the Xpovio.  When we last saw him, his Kappa light chain was down to 14 mg/dL.  He is having a little bit of diarrhea.  Otherwise, seems to be doing pretty well with this.  Unfortunately, his wife just broke her foot.  Thankfully, she did not need surgery for this.  His heart is doing okay.  He has congestive heart failure.  He is on metoprolol and Entresto.  There has been no fever.  He has had no cough.  He has had no shortness of breath.  He is a little bit more anemic.  This might be from the Xpovio.  His erythropoietin level is 51 when we last checked it.  We probably need to recheck this again.  I will go ahead and give him the Zometa today.  He  is due for in July.  Since he is here, I think would be worthwhile to give him a dose.  Overall, his performance status is probably ECOG 1.    Medications:  Allergies as of 08/13/2021       Reactions   Budesonide-formoterol Fumarate Itching, Other (See Comments)   Codeine Itching   Hydrocodone-acetaminophen Itching   Mushroom Extract Complex Nausea And Vomiting, Other (See Comments)   Only shitake mushroom  causes this reaction. Flu-like symptoms from portabello mushrooms        Medication List        Accurate as of August 13, 2021  1:58 PM. If you have any questions, ask your nurse or doctor.          cetirizine 10 MG tablet Commonly known as: ZYRTEC TAKE 1 TABLET EVERY DAY   Cyanocobalamin 1000 MCG Tbcr Take 1 tablet by mouth daily.   dexamethasone 4 MG tablet Commonly known as: DECADRON TAKE 5 TABLETS ONCE WEEKLY WITH XPOVIO   empagliflozin 10 MG Tabs tablet Commonly known as: JARDIANCE Take by mouth daily.   esomeprazole 40 MG capsule Commonly known as: NEXIUM TAKE 1 CAPSULE EVERY DAY AT 12:00 PM  famciclovir 500 MG tablet Commonly known as: FAMVIR TAKE 1 TABLET (500 MG TOTAL) BY MOUTH DAILY.   fluticasone 50 MCG/ACT nasal spray Commonly known as: FLONASE Place 2 sprays into both nostrils daily.   furosemide 20 MG tablet Commonly known as: LASIX Take 20 mg by mouth daily as needed.   gabapentin 600 MG tablet Commonly known as: NEURONTIN TAKE 1 TABLET THREE TIMES DAILY   levothyroxine 100 MCG tablet Commonly known as: SYNTHROID TAKE 1 TABLET EVERY DAY BEFORE BREAKFAST   metoprolol succinate 50 MG 24 hr tablet Commonly known as: TOPROL-XL Take 50 mg by mouth daily.   nitroGLYCERIN 0.4 MG SL tablet Commonly known as: NITROSTAT PLACE 1 TABLET UNDER THE TONGUE EVERY 5 (FIVE) MINUTES AS NEEDED FOR CHEST PAIN AS DIRECTED   ondansetron 8 MG tablet Commonly known as: ZOFRAN Take 1 tablet (8 mg total) by mouth every 8 (eight) hours as needed for  nausea or vomiting.   oxyCODONE-acetaminophen 10-325 MG tablet Commonly known as: PERCOCET 1 tab 1-2 times daily as needed in addition to OxyContin.   potassium chloride SA 20 MEQ tablet Commonly known as: KLOR-CON M TAKE 1 TABLET TWICE DAILY   prochlorperazine 10 MG tablet Commonly known as: COMPAZINE TAKE 1 TABLET (10 MG TOTAL) BY MOUTH EVERY 6 (SIX) HOURS AS NEEDED (NAUSEA OR VOMITING).   pyridoxine 100 MG tablet Commonly known as: B-6 Take 100 mg by mouth daily.   Repatha SureClick 782 MG/ML Soaj Generic drug: Evolocumab Inject 140 mg into the skin every 14 (fourteen) days.   sacubitril-valsartan 97-103 MG Commonly known as: ENTRESTO Take 1 tablet by mouth 2 (two) times daily.   selinexor Therapy Pack (80 mg once weekly) Commonly known as: Xpovio (80 MG Once Weekly) Take 2 tablets (80 mg total) by mouth once a week.   tamsulosin 0.4 MG Caps capsule Commonly known as: FLOMAX TAKE 1 CAPSULE (0.4 MG TOTAL) BY MOUTH DAILY AFTER SUPPER.   Xarelto 15 MG Tabs tablet Generic drug: Rivaroxaban Take by mouth daily.   Xtampza ER 9 MG C12a Generic drug: oxyCODONE ER Take 1 capsule by mouth in the morning and at bedtime.   Zoledronic Acid 4 MG Solr Inject into the vein.        Allergies:  Allergies  Allergen Reactions   Budesonide-Formoterol Fumarate Itching and Other (See Comments)   Codeine Itching   Hydrocodone-Acetaminophen Itching   Mushroom Extract Complex Nausea And Vomiting and Other (See Comments)    Only shitake mushroom  causes this reaction. Flu-like symptoms from portabello mushrooms    Past Medical History, Surgical history, Social history, and Family History were reviewed and updated.  Review of Systems: Review of Systems  Constitutional: Negative.   HENT: Negative.    Eyes: Negative.   Respiratory: Negative.    Cardiovascular:  Positive for palpitations.  Gastrointestinal: Negative.   Genitourinary:  Positive for frequency and urgency.   Musculoskeletal:  Positive for back pain.  Skin: Negative.   Neurological: Negative.   Endo/Heme/Allergies: Negative.   Psychiatric/Behavioral: Negative.       Physical Exam:  height is 6' (1.829 m) and weight is 198 lb (89.8 kg). His oral temperature is 97.9 F (36.6 C). His blood pressure is 104/38 (abnormal) and his pulse is 59 (abnormal). His respiration is 18 and oxygen saturation is 100%.   Wt Readings from Last 3 Encounters:  08/13/21 198 lb (89.8 kg)  07/22/21 197 lb (89.4 kg)  07/09/21 202 lb (91.6 kg)    Physical Exam  Vitals reviewed.  HENT:     Head: Normocephalic and atraumatic.  Eyes:     Pupils: Pupils are equal, round, and reactive to light.  Cardiovascular:     Rate and Rhythm: Normal rate and regular rhythm.     Heart sounds: Normal heart sounds.  Pulmonary:     Effort: Pulmonary effort is normal.     Breath sounds: Normal breath sounds.  Abdominal:     General: Bowel sounds are normal.     Palpations: Abdomen is soft.  Musculoskeletal:        General: No tenderness or deformity. Normal range of motion.     Cervical back: Normal range of motion.  Lymphadenopathy:     Cervical: No cervical adenopathy.  Skin:    General: Skin is warm and dry.     Findings: No erythema or rash.  Neurological:     Mental Status: He is alert and oriented to person, place, and time.  Psychiatric:        Behavior: Behavior normal.        Thought Content: Thought content normal.        Judgment: Judgment normal.     Lab Results  Component Value Date   WBC 5.1 08/13/2021   HGB 9.8 (L) 08/13/2021   HCT 30.3 (L) 08/13/2021   MCV 97.4 08/13/2021   PLT 142 (L) 08/13/2021   Lab Results  Component Value Date   FERRITIN 882 (H) 07/09/2021   IRON 112 07/09/2021   TIBC 274 07/09/2021   UIBC 162 07/09/2021   IRONPCTSAT 41 (H) 07/09/2021   Lab Results  Component Value Date   RETICCTPCT 1.2 05/06/2021   RBC 3.11 (L) 08/13/2021   Lab Results  Component Value Date    KPAFRELGTCHN 140.0 (H) 07/09/2021   LAMBDASER 2.6 (L) 07/09/2021   KAPLAMBRATIO 53.85 (H) 07/09/2021   Lab Results  Component Value Date   IGGSERUM 496 (L) 07/09/2021   IGGSERUM 500 (L) 07/09/2021   IGA 10 (L) 07/09/2021   IGA 6 (L) 07/09/2021   IGMSERUM 10 (L) 07/09/2021   IGMSERUM 8 (L) 07/09/2021   Lab Results  Component Value Date   TOTALPROTELP 6.1 07/09/2021   ALBUMINELP 3.8 07/09/2021   A1GS 0.2 07/09/2021   A2GS 0.9 07/09/2021   BETS 0.8 07/09/2021   BETA2SER 0.3 02/11/2015   GAMS 0.4 07/09/2021   MSPIKE 0.1 (H) 07/09/2021   SPEI Comment 02/18/2021     Chemistry      Component Value Date/Time   NA 137 08/13/2021 1200   NA 145 (H) 06/28/2019 1501   NA 140 01/27/2017 1044   NA 141 06/05/2015 1036   K 3.9 08/13/2021 1200   K 3.3 01/27/2017 1044   K 2.9 (LL) 06/05/2015 1036   CL 100 08/13/2021 1200   CL 101 01/27/2017 1044   CO2 27 08/13/2021 1200   CO2 28 01/27/2017 1044   CO2 27 06/05/2015 1036   BUN 38 (H) 08/13/2021 1200   BUN 15 06/28/2019 1501   BUN 14 01/27/2017 1044   BUN 13.1 06/05/2015 1036   CREATININE 1.36 (H) 08/13/2021 1200   CREATININE 2.49 (H) 11/25/2020 0000   CREATININE 1.0 06/05/2015 1036   GLU 152 03/30/2016 0000      Component Value Date/Time   CALCIUM 9.0 08/13/2021 1200   CALCIUM 8.9 01/27/2017 1044   CALCIUM 9.4 06/05/2015 1036   ALKPHOS 73 08/13/2021 1200   ALKPHOS 126 (H) 01/27/2017 1044   ALKPHOS 94 06/05/2015 1036  AST 13 (L) 08/13/2021 1200   AST 22 06/05/2015 1036   ALT 34 08/13/2021 1200   ALT 33 01/27/2017 1044   ALT 18 06/05/2015 1036   BILITOT 0.8 08/13/2021 1200   BILITOT 0.59 06/05/2015 1036       Impression and Plan: Mr. Lauricella is a very pleasant 81 yo caucasian gentleman with history of IgG kappa myeloma. He underwent standard induction chemotherapy and then ultimately stem cell transplant at [facility] in February 2017.   He had recurrence with kappa light chain disease.  He had been on Kyprolis.  He  apparently had some cardiac issues.  I suspect that the Kyprolis probably was a contributing factor.   We now have him on Xpovio.  We have him on single agent Xpovio.  He has been on Xpovio now for almost 3 months.  I think he is done pretty well with this.  I know that he will get excellent care from the oncologists over at [facility].  I just hate the fact that we will lose him.  He is always been so kind.  He has a lot of fun to talk to.  He has such a wealth of experience.  He is very eloquent.  We just want to do his best for him and his family.  We will always be here for him.  If there is any issues that might be a problem, we can try to help him out.   [name], MD 6/30/20231:58 PM

## 2021-08-13 NOTE — Patient Instructions (Signed)

## 2021-08-15 LAB — IGG, IGA, IGM
IgA: 11 mg/dL — ABNORMAL LOW (ref 61–437)
IgG (Immunoglobin G), Serum: 512 mg/dL — ABNORMAL LOW (ref 603–1613)
IgM (Immunoglobulin M), Srm: 9 mg/dL — ABNORMAL LOW (ref 15–143)

## 2021-08-16 ENCOUNTER — Telehealth: Payer: Self-pay | Admitting: *Deleted

## 2021-08-16 ENCOUNTER — Telehealth: Payer: Self-pay

## 2021-08-16 LAB — KAPPA/LAMBDA LIGHT CHAINS
Kappa free light chain: 155.6 mg/L — ABNORMAL HIGH (ref 3.3–19.4)
Kappa, lambda light chain ratio: 44.46 — ABNORMAL HIGH (ref 0.26–1.65)
Lambda free light chains: 3.5 mg/L — ABNORMAL LOW (ref 5.7–26.3)

## 2021-08-16 NOTE — Telephone Encounter (Signed)
-----   Message from [name], MD sent at 08/13/2021  5:10 PM EDT ----- Call - the iron levels are ok!!!  [name] [name]

## 2021-08-16 NOTE — Telephone Encounter (Signed)
Faxed referral to [facility] [phone]

## 2021-08-17 LAB — PROTEIN ELECTROPHORESIS, SERUM, WITH REFLEX
A/G Ratio: 1.7 (ref 0.7–1.7)
Albumin ELP: 3.8 g/dL (ref 2.9–4.4)
Alpha-1-Globulin: 0.2 g/dL (ref 0.0–0.4)
Alpha-2-Globulin: 0.9 g/dL (ref 0.4–1.0)
Beta Globulin: 0.9 g/dL (ref 0.7–1.3)
Gamma Globulin: 0.3 g/dL — ABNORMAL LOW (ref 0.4–1.8)
Globulin, Total: 2.2 g/dL (ref 2.2–3.9)
Total Protein ELP: 6 g/dL (ref 6.0–8.5)

## 2021-08-20 ENCOUNTER — Ambulatory Visit
Admission: RE | Admit: 2021-08-20 | Discharge: 2021-08-20 | Disposition: A | Discharge status: Home / Self Care | Payer: Medicare Other | Source: Ambulatory Visit | Attending: Sports Medicine | Admitting: Sports Medicine

## 2021-08-20 DIAGNOSIS — M47812 Spondylosis without myelopathy or radiculopathy, cervical region: Secondary | ICD-10-CM | POA: Diagnosis not present

## 2021-08-20 DIAGNOSIS — M5030 Other cervical disc degeneration, unspecified cervical region: Secondary | ICD-10-CM

## 2021-08-20 MED ORDER — IOPAMIDOL (ISOVUE-M 300) INJECTION 61%
1.0000 mL | Freq: Once | INTRAMUSCULAR | Status: AC
  Administered 2021-08-20: 1 mL via EPIDURAL

## 2021-08-20 MED ORDER — TRIAMCINOLONE ACETONIDE 40 MG/ML IJ SUSP (RADIOLOGY)
60.0000 mg | Freq: Once | INTRAMUSCULAR | Status: AC
  Administered 2021-08-20: 60 mg via EPIDURAL

## 2021-08-20 NOTE — Discharge Instructions (Signed)

## 2021-09-01 DIAGNOSIS — Z9484 Stem cells transplant status: Secondary | ICD-10-CM | POA: Diagnosis not present

## 2021-09-01 DIAGNOSIS — C9002 Multiple myeloma in relapse: Secondary | ICD-10-CM | POA: Diagnosis not present

## 2021-09-01 DIAGNOSIS — M899 Disorder of bone, unspecified: Secondary | ICD-10-CM | POA: Diagnosis not present

## 2021-09-03 DIAGNOSIS — M899 Disorder of bone, unspecified: Secondary | ICD-10-CM | POA: Diagnosis not present

## 2021-09-03 DIAGNOSIS — C9002 Multiple myeloma in relapse: Secondary | ICD-10-CM | POA: Diagnosis not present

## 2021-09-03 DIAGNOSIS — Z9484 Stem cells transplant status: Secondary | ICD-10-CM | POA: Diagnosis not present

## 2021-09-05 ENCOUNTER — Encounter (INDEPENDENT_AMBULATORY_CARE_PROVIDER_SITE_OTHER): Payer: Medicare Other | Admitting: Sports Medicine

## 2021-09-05 DIAGNOSIS — M47815 Spondylosis without myelopathy or radiculopathy, thoracolumbar region: Secondary | ICD-10-CM | POA: Diagnosis not present

## 2021-09-06 DIAGNOSIS — J479 Bronchiectasis, uncomplicated: Secondary | ICD-10-CM | POA: Diagnosis not present

## 2021-09-06 DIAGNOSIS — K449 Diaphragmatic hernia without obstruction or gangrene: Secondary | ICD-10-CM | POA: Diagnosis not present

## 2021-09-06 DIAGNOSIS — Z91018 Allergy to other foods: Secondary | ICD-10-CM | POA: Diagnosis not present

## 2021-09-06 DIAGNOSIS — R918 Other nonspecific abnormal finding of lung field: Secondary | ICD-10-CM | POA: Diagnosis not present

## 2021-09-06 DIAGNOSIS — I517 Cardiomegaly: Secondary | ICD-10-CM | POA: Diagnosis not present

## 2021-09-06 DIAGNOSIS — K219 Gastro-esophageal reflux disease without esophagitis: Secondary | ICD-10-CM | POA: Diagnosis not present

## 2021-09-06 DIAGNOSIS — Z888 Allergy status to other drugs, medicaments and biological substances status: Secondary | ICD-10-CM | POA: Diagnosis not present

## 2021-09-06 DIAGNOSIS — I10 Essential (primary) hypertension: Secondary | ICD-10-CM | POA: Diagnosis not present

## 2021-09-06 DIAGNOSIS — I959 Hypotension, unspecified: Secondary | ICD-10-CM | POA: Diagnosis not present

## 2021-09-06 DIAGNOSIS — Z7901 Long term (current) use of anticoagulants: Secondary | ICD-10-CM | POA: Diagnosis not present

## 2021-09-06 DIAGNOSIS — J449 Chronic obstructive pulmonary disease, unspecified: Secondary | ICD-10-CM | POA: Diagnosis not present

## 2021-09-06 DIAGNOSIS — M47899 Other spondylosis, site unspecified: Secondary | ICD-10-CM | POA: Diagnosis not present

## 2021-09-06 DIAGNOSIS — Z79899 Other long term (current) drug therapy: Secondary | ICD-10-CM | POA: Diagnosis not present

## 2021-09-06 DIAGNOSIS — R296 Repeated falls: Secondary | ICD-10-CM | POA: Diagnosis not present

## 2021-09-06 DIAGNOSIS — R0602 Shortness of breath: Secondary | ICD-10-CM | POA: Diagnosis not present

## 2021-09-06 DIAGNOSIS — E785 Hyperlipidemia, unspecified: Secondary | ICD-10-CM | POA: Diagnosis not present

## 2021-09-06 DIAGNOSIS — Z87891 Personal history of nicotine dependence: Secondary | ICD-10-CM | POA: Diagnosis not present

## 2021-09-06 DIAGNOSIS — Z885 Allergy status to narcotic agent status: Secondary | ICD-10-CM | POA: Diagnosis not present

## 2021-09-06 DIAGNOSIS — Z955 Presence of coronary angioplasty implant and graft: Secondary | ICD-10-CM | POA: Diagnosis not present

## 2021-09-06 DIAGNOSIS — R42 Dizziness and giddiness: Secondary | ICD-10-CM | POA: Diagnosis not present

## 2021-09-06 DIAGNOSIS — C9000 Multiple myeloma not having achieved remission: Secondary | ICD-10-CM | POA: Diagnosis not present

## 2021-09-06 DIAGNOSIS — M542 Cervicalgia: Secondary | ICD-10-CM | POA: Diagnosis not present

## 2021-09-06 DIAGNOSIS — Z79890 Hormone replacement therapy (postmenopausal): Secondary | ICD-10-CM | POA: Diagnosis not present

## 2021-09-06 DIAGNOSIS — R5383 Other fatigue: Secondary | ICD-10-CM | POA: Diagnosis not present

## 2021-09-06 DIAGNOSIS — J90 Pleural effusion, not elsewhere classified: Secondary | ICD-10-CM | POA: Diagnosis not present

## 2021-09-06 DIAGNOSIS — R600 Localized edema: Secondary | ICD-10-CM | POA: Diagnosis not present

## 2021-09-06 DIAGNOSIS — Z7984 Long term (current) use of oral hypoglycemic drugs: Secondary | ICD-10-CM | POA: Diagnosis not present

## 2021-09-06 MED ORDER — OXYCODONE-ACETAMINOPHEN 10-325 MG PO TABS: ORAL_TABLET | ORAL | 0 refills | Status: DC

## 2021-09-06 MED ORDER — XTAMPZA ER 9 MG PO C12A: 1.0000 | EXTENDED_RELEASE_CAPSULE | Freq: Two times a day (BID) | ORAL | 0 refills | Status: DC

## 2021-09-06 NOTE — Telephone Encounter (Signed)
I spent 5 total minutes of online digital evaluation and management services in this patient-initiated request for online care. 

## 2021-09-10 DIAGNOSIS — I255 Ischemic cardiomyopathy: Secondary | ICD-10-CM | POA: Diagnosis not present

## 2021-09-10 DIAGNOSIS — I480 Paroxysmal atrial fibrillation: Secondary | ICD-10-CM | POA: Diagnosis not present

## 2021-09-10 DIAGNOSIS — I2510 Atherosclerotic heart disease of native coronary artery without angina pectoris: Secondary | ICD-10-CM | POA: Diagnosis not present

## 2021-09-10 DIAGNOSIS — Z7901 Long term (current) use of anticoagulants: Secondary | ICD-10-CM | POA: Diagnosis not present

## 2021-09-10 DIAGNOSIS — Z955 Presence of coronary angioplasty implant and graft: Secondary | ICD-10-CM | POA: Diagnosis not present

## 2021-09-21 ENCOUNTER — Ambulatory Visit: Payer: Medicare Other | Admitting: Sports Medicine

## 2021-09-21 ENCOUNTER — Ambulatory Visit (INDEPENDENT_AMBULATORY_CARE_PROVIDER_SITE_OTHER): Payer: Medicare Other | Admitting: Sports Medicine

## 2021-09-21 ENCOUNTER — Encounter: Payer: Self-pay | Admitting: Sports Medicine

## 2021-09-21 DIAGNOSIS — M5030 Other cervical disc degeneration, unspecified cervical region: Secondary | ICD-10-CM | POA: Diagnosis not present

## 2021-09-21 DIAGNOSIS — Z9861 Coronary angioplasty status: Secondary | ICD-10-CM

## 2021-09-21 DIAGNOSIS — I2510 Atherosclerotic heart disease of native coronary artery without angina pectoris: Secondary | ICD-10-CM | POA: Diagnosis not present

## 2021-09-21 DIAGNOSIS — I10 Essential (primary) hypertension: Secondary | ICD-10-CM | POA: Diagnosis not present

## 2021-09-21 MED ORDER — ENTRESTO 24-26 MG PO TABS: 1.0000 | ORAL_TABLET | Freq: Two times a day (BID) | ORAL | 3 refills | Status: DC

## 2021-09-21 NOTE — Assessment & Plan Note (Signed)
[name] [name] does have moderate to severe cervical spinal stenosis likely contributing to his falls as well, once we get his blood pressure under control then we can further address his spinal stenosis.

## 2021-09-21 NOTE — Assessment & Plan Note (Signed)
Cardarius returns, we have been working hard to keep his blood pressure high enough, his cardiac medications have unfortunately continued to cause hypotension, presyncope and more recently falls. He is currently on Lasix, Flomax, Entresto high-dose, Toprol. Blood pressures in the 38U systolic today, he has had some falls. We will decrease Entresto to the lowest dose and hold Lasix for now, he understands to restart if he develops any orthopnea or paroxysmal nocturnal dyspnea. He will wear his compression stockings to prevent his edema. We will change Flomax dosages just yet. Follow-up with me in 2 weeks.

## 2021-09-21 NOTE — Progress Notes (Signed)
    Procedures performed today:    None.  Independent interpretation of notes and tests performed by another provider:   None.  Brief History, Exam, Impression, and Recommendations:    Essential hypertension, benign Bentley returns, we have been working hard to keep his blood pressure high enough, his cardiac medications have unfortunately continued to cause hypotension, presyncope and more recently falls. He is currently on Lasix, Flomax, Entresto high-dose, Toprol. Blood pressures in the 40N systolic today, he has had some falls. We will decrease Entresto to the lowest dose and hold Lasix for now, he understands to restart if he develops any orthopnea or paroxysmal nocturnal dyspnea. He will wear his compression stockings to prevent his edema. We will change Flomax dosages just yet. Follow-up with me in 2 weeks.  Degenerative disc disease, cervical Cace does have moderate to severe cervical spinal stenosis likely contributing to his falls as well, once we get his blood pressure under control then we can further address his spinal stenosis.  I spent 30 minutes of total time managing this patient today, this includes chart review, face to face, and non-face to face time.  ____________________________________________ Gwen Her. Dianah Field, M.D., ABFM., CAQSM., AME. Primary Care and Sports Medicine [facility]  Adjunct Professor of [place] of [facility] of [employer]  Risk manager

## 2021-09-27 DIAGNOSIS — M5412 Radiculopathy, cervical region: Secondary | ICD-10-CM | POA: Diagnosis not present

## 2021-09-27 DIAGNOSIS — C9002 Multiple myeloma in relapse: Secondary | ICD-10-CM | POA: Diagnosis not present

## 2021-09-27 DIAGNOSIS — I951 Orthostatic hypotension: Secondary | ICD-10-CM | POA: Diagnosis not present

## 2021-09-27 DIAGNOSIS — Z9484 Stem cells transplant status: Secondary | ICD-10-CM | POA: Diagnosis not present

## 2021-09-27 DIAGNOSIS — G620 Drug-induced polyneuropathy: Secondary | ICD-10-CM | POA: Diagnosis not present

## 2021-09-30 ENCOUNTER — Telehealth: Payer: Self-pay

## 2021-09-30 DIAGNOSIS — Z955 Presence of coronary angioplasty implant and graft: Secondary | ICD-10-CM | POA: Diagnosis not present

## 2021-09-30 DIAGNOSIS — Z9181 History of falling: Secondary | ICD-10-CM | POA: Diagnosis not present

## 2021-09-30 DIAGNOSIS — I2510 Atherosclerotic heart disease of native coronary artery without angina pectoris: Secondary | ICD-10-CM | POA: Diagnosis not present

## 2021-09-30 DIAGNOSIS — Z79899 Other long term (current) drug therapy: Secondary | ICD-10-CM | POA: Diagnosis not present

## 2021-09-30 DIAGNOSIS — D8481 Immunodeficiency due to conditions classified elsewhere: Secondary | ICD-10-CM | POA: Diagnosis not present

## 2021-09-30 DIAGNOSIS — D6869 Other thrombophilia: Secondary | ICD-10-CM | POA: Diagnosis not present

## 2021-09-30 DIAGNOSIS — G893 Neoplasm related pain (acute) (chronic): Secondary | ICD-10-CM | POA: Diagnosis not present

## 2021-09-30 DIAGNOSIS — J449 Chronic obstructive pulmonary disease, unspecified: Secondary | ICD-10-CM | POA: Diagnosis not present

## 2021-09-30 DIAGNOSIS — C9002 Multiple myeloma in relapse: Secondary | ICD-10-CM | POA: Diagnosis present

## 2021-09-30 DIAGNOSIS — Z86711 Personal history of pulmonary embolism: Secondary | ICD-10-CM | POA: Diagnosis not present

## 2021-09-30 DIAGNOSIS — Z86718 Personal history of other venous thrombosis and embolism: Secondary | ICD-10-CM | POA: Diagnosis not present

## 2021-09-30 DIAGNOSIS — Z7409 Other reduced mobility: Secondary | ICD-10-CM | POA: Diagnosis not present

## 2021-09-30 DIAGNOSIS — T451X5A Adverse effect of antineoplastic and immunosuppressive drugs, initial encounter: Secondary | ICD-10-CM | POA: Diagnosis not present

## 2021-09-30 DIAGNOSIS — D630 Anemia in neoplastic disease: Secondary | ICD-10-CM | POA: Diagnosis not present

## 2021-09-30 DIAGNOSIS — I5033 Acute on chronic diastolic (congestive) heart failure: Secondary | ICD-10-CM | POA: Diagnosis not present

## 2021-09-30 DIAGNOSIS — R0602 Shortness of breath: Secondary | ICD-10-CM | POA: Diagnosis not present

## 2021-09-30 DIAGNOSIS — I427 Cardiomyopathy due to drug and external agent: Secondary | ICD-10-CM | POA: Diagnosis not present

## 2021-09-30 DIAGNOSIS — Z7984 Long term (current) use of oral hypoglycemic drugs: Secondary | ICD-10-CM | POA: Diagnosis not present

## 2021-09-30 DIAGNOSIS — I110 Hypertensive heart disease with heart failure: Secondary | ICD-10-CM | POA: Diagnosis not present

## 2021-09-30 DIAGNOSIS — Z87891 Personal history of nicotine dependence: Secondary | ICD-10-CM | POA: Diagnosis not present

## 2021-09-30 DIAGNOSIS — Z9484 Stem cells transplant status: Secondary | ICD-10-CM | POA: Diagnosis not present

## 2021-09-30 DIAGNOSIS — I509 Heart failure, unspecified: Secondary | ICD-10-CM | POA: Diagnosis not present

## 2021-09-30 DIAGNOSIS — G4733 Obstructive sleep apnea (adult) (pediatric): Secondary | ICD-10-CM | POA: Diagnosis not present

## 2021-09-30 DIAGNOSIS — B370 Candidal stomatitis: Secondary | ICD-10-CM | POA: Diagnosis not present

## 2021-09-30 DIAGNOSIS — Z5111 Encounter for antineoplastic chemotherapy: Secondary | ICD-10-CM | POA: Diagnosis not present

## 2021-09-30 DIAGNOSIS — C9000 Multiple myeloma not having achieved remission: Secondary | ICD-10-CM | POA: Diagnosis not present

## 2021-09-30 DIAGNOSIS — Z7901 Long term (current) use of anticoagulants: Secondary | ICD-10-CM | POA: Diagnosis not present

## 2021-09-30 DIAGNOSIS — E663 Overweight: Secondary | ICD-10-CM | POA: Diagnosis not present

## 2021-09-30 DIAGNOSIS — Z6827 Body mass index (BMI) 27.0-27.9, adult: Secondary | ICD-10-CM | POA: Diagnosis not present

## 2021-09-30 DIAGNOSIS — Z7952 Long term (current) use of systemic steroids: Secondary | ICD-10-CM | POA: Diagnosis not present

## 2021-09-30 DIAGNOSIS — J438 Other emphysema: Secondary | ICD-10-CM | POA: Diagnosis not present

## 2021-09-30 DIAGNOSIS — Z5112 Encounter for antineoplastic immunotherapy: Secondary | ICD-10-CM | POA: Diagnosis not present

## 2021-09-30 DIAGNOSIS — E119 Type 2 diabetes mellitus without complications: Secondary | ICD-10-CM | POA: Diagnosis not present

## 2021-09-30 DIAGNOSIS — J439 Emphysema, unspecified: Secondary | ICD-10-CM | POA: Diagnosis not present

## 2021-09-30 DIAGNOSIS — D649 Anemia, unspecified: Secondary | ICD-10-CM | POA: Diagnosis not present

## 2021-09-30 DIAGNOSIS — I480 Paroxysmal atrial fibrillation: Secondary | ICD-10-CM | POA: Diagnosis not present

## 2021-09-30 DIAGNOSIS — J984 Other disorders of lung: Secondary | ICD-10-CM | POA: Diagnosis not present

## 2021-09-30 NOTE — Telephone Encounter (Signed)
Called received from [facility] asking if they can cancel patients xpovio, state pt called and requested cancel as he needs to started new medication. Informed pharmacy patient is no longer being seen here so okay to cancel medication.

## 2021-10-01 DIAGNOSIS — Z7901 Long term (current) use of anticoagulants: Secondary | ICD-10-CM | POA: Diagnosis not present

## 2021-10-01 DIAGNOSIS — Z9484 Stem cells transplant status: Secondary | ICD-10-CM | POA: Diagnosis not present

## 2021-10-01 DIAGNOSIS — J438 Other emphysema: Secondary | ICD-10-CM | POA: Diagnosis not present

## 2021-10-01 DIAGNOSIS — R0602 Shortness of breath: Secondary | ICD-10-CM | POA: Diagnosis not present

## 2021-10-01 DIAGNOSIS — C9000 Multiple myeloma not having achieved remission: Secondary | ICD-10-CM | POA: Diagnosis not present

## 2021-10-01 DIAGNOSIS — I5033 Acute on chronic diastolic (congestive) heart failure: Secondary | ICD-10-CM | POA: Diagnosis not present

## 2021-10-01 DIAGNOSIS — E119 Type 2 diabetes mellitus without complications: Secondary | ICD-10-CM | POA: Diagnosis not present

## 2021-10-01 DIAGNOSIS — D649 Anemia, unspecified: Secondary | ICD-10-CM | POA: Diagnosis not present

## 2021-10-02 DIAGNOSIS — C9000 Multiple myeloma not having achieved remission: Secondary | ICD-10-CM | POA: Diagnosis not present

## 2021-10-02 DIAGNOSIS — Z7901 Long term (current) use of anticoagulants: Secondary | ICD-10-CM | POA: Diagnosis not present

## 2021-10-02 DIAGNOSIS — D649 Anemia, unspecified: Secondary | ICD-10-CM | POA: Diagnosis not present

## 2021-10-02 DIAGNOSIS — E119 Type 2 diabetes mellitus without complications: Secondary | ICD-10-CM | POA: Diagnosis not present

## 2021-10-02 DIAGNOSIS — Z9484 Stem cells transplant status: Secondary | ICD-10-CM | POA: Diagnosis not present

## 2021-10-02 DIAGNOSIS — I5033 Acute on chronic diastolic (congestive) heart failure: Secondary | ICD-10-CM | POA: Diagnosis not present

## 2021-10-02 DIAGNOSIS — J438 Other emphysema: Secondary | ICD-10-CM | POA: Diagnosis not present

## 2021-10-04 ENCOUNTER — Telehealth: Payer: Self-pay | Admitting: *Deleted

## 2021-10-04 ENCOUNTER — Encounter: Payer: Self-pay | Admitting: *Deleted

## 2021-10-04 DIAGNOSIS — I509 Heart failure, unspecified: Secondary | ICD-10-CM | POA: Diagnosis not present

## 2021-10-04 DIAGNOSIS — Z5111 Encounter for antineoplastic chemotherapy: Secondary | ICD-10-CM | POA: Diagnosis not present

## 2021-10-04 DIAGNOSIS — I5033 Acute on chronic diastolic (congestive) heart failure: Secondary | ICD-10-CM | POA: Diagnosis not present

## 2021-10-04 DIAGNOSIS — J449 Chronic obstructive pulmonary disease, unspecified: Secondary | ICD-10-CM | POA: Diagnosis not present

## 2021-10-04 DIAGNOSIS — C9002 Multiple myeloma in relapse: Secondary | ICD-10-CM | POA: Diagnosis not present

## 2021-10-04 DIAGNOSIS — Z9484 Stem cells transplant status: Secondary | ICD-10-CM | POA: Diagnosis not present

## 2021-10-04 DIAGNOSIS — C9000 Multiple myeloma not having achieved remission: Secondary | ICD-10-CM | POA: Diagnosis not present

## 2021-10-04 DIAGNOSIS — Z87891 Personal history of nicotine dependence: Secondary | ICD-10-CM | POA: Diagnosis not present

## 2021-10-04 DIAGNOSIS — I110 Hypertensive heart disease with heart failure: Secondary | ICD-10-CM | POA: Diagnosis not present

## 2021-10-04 NOTE — Patient Outreach (Signed)
  Care Coordination [facility] Note Transition Care Management Follow-up Telephone Call Date of discharge and from where: 10/02/21 [facility] How have you been since you were released from the hospital? "I have been doing okay-- these hospital visits are scheduled around my cancer therapy- they have started a new medication that gets stronger with each of 3 consecutive treatments, so I have to be hospitalized for these treatments; it is protocol; this was my first treatment, I have 2 more to go" Any questions or concerns? No  Items Reviewed: Did the pt receive and understand the discharge instructions provided? Yes  Medications obtained and verified? Yes  Other?  N/A Any new allergies since your discharge? No  Dietary orders reviewed? Yes Do you have support at home? Yes  spouse Verdis Frederickson on [facility] [employer] assists "with anything I need"  Home Care and Equipment/Supplies: Were home health services ordered? not applicable If so, what is the name of the agency? N/A  Has the agency set up a time to come to the patient's home? not applicable Were any new equipment or medical supplies ordered?  Yes: rollator What is the name of the medical supply agency? "I am not sure- they just ordered it" Were you able to get the supplies/equipment? No "not yet, but I expect to hear from them soon;" patient was advised to obtain name of company and phone number- states "was ordered by the cancer center at [facility]" Do you have any questions related to the use of the equipment or supplies? No  Functional Questionnaire: (I = Independent and D = Dependent) ADLs: I  spouse assists as indicated  Bathing/Dressing- I  spouse assists as indicated  Meal Prep- I  spouse assists as indicated  Eating- I  Maintaining continence- I  Transferring/Ambulation- I  spouse assists as indicated  Managing Meds- I  spouse assists as indicated  Follow up appointments reviewed:  PCP Hospital f/u appt confirmed?  Yes  Scheduled to see PCP Dr. Dianah Field on Thursday 10/07/21 @ 10:30 Specialist Hospital f/u appt confirmed? Yes  Scheduled to see oncology team at [facility] on 10/04/21 @ 1:45 pm Are transportation arrangements needed? No  If their condition worsens, is the pt aware to call PCP or go to the Emergency Dept.? Yes Was the patient provided with contact information for the PCP's office or ED? Yes Was to pt encouraged to call back with questions or concerns? Yes  SDOH assessments and interventions completed:   Yes  Care Coordination Interventions Activated:  Yes   Care Coordination Interventions:   provided education around process to obtain DME (rollator) recently ordered by oncology provider: advised patient to obtain name/ phone number from oncology provider during today's visit, in case he needs to follow up with DME company     Encounter Outcome:  Pt. Visit Completed    Oneta Rack, RN, BSN, [place] RN [place] Management [phone]: direct office

## 2021-10-05 DIAGNOSIS — I5033 Acute on chronic diastolic (congestive) heart failure: Secondary | ICD-10-CM | POA: Diagnosis not present

## 2021-10-05 DIAGNOSIS — C9000 Multiple myeloma not having achieved remission: Secondary | ICD-10-CM | POA: Diagnosis not present

## 2021-10-06 DIAGNOSIS — C9000 Multiple myeloma not having achieved remission: Secondary | ICD-10-CM | POA: Diagnosis not present

## 2021-10-07 ENCOUNTER — Telehealth: Payer: Self-pay | Admitting: *Deleted

## 2021-10-07 ENCOUNTER — Encounter: Payer: Self-pay | Admitting: *Deleted

## 2021-10-07 ENCOUNTER — Ambulatory Visit: Payer: Medicare Other | Admitting: Sports Medicine

## 2021-10-07 NOTE — Patient Outreach (Signed)
  Care Coordination TOC Note Transition Care Management Follow-up Telephone Call Date of discharge and from where: 10/06/21 [facility] How have you been since you were released from the hospital? "I am doing great; I tolerated the second treatment without problems-- the treatments get stronger with each one-- I only have one left, I will go back to the hospital tomorrow for that one; they keep me in the hospital for a night or two because that is the treatment protocol; I will be going in for the last/ final treatment tomorrow and it will be the strongest dose in the series of 3 treatments" Any questions or concerns? No  Items Reviewed: Did the pt receive and understand the discharge instructions provided? Yes  Medications obtained and verified?  N/A- patient declines- he is not at home at time of call; states no medication changes and confirms taking all as prescribed/ self-manages medications  Other? No  Any new allergies since your discharge? No  Dietary orders reviewed? Yes Do you have support at home? Yes   Home Care and Equipment/Supplies: Were home health services ordered? no If so, what is the name of the agency? N/A  Has the agency set up a time to come to the patient's home? not applicable Were any new equipment or medical supplies ordered?  No What is the name of the medical supply agency? N/A Were you able to get the supplies/equipment? not applicable Do you have any questions related to the use of the equipment or supplies? No N/A  Functional Questionnaire: (I = Independent and D = Dependent) ADLs: I  spouse assists as indicated  Bathing/Dressing- I  spouse assists as indicated  Meal Prep- I  spouse assists as indicated  Eating- I  Maintaining continence- I  Transferring/Ambulation- I  Managing Meds- I  spouse assists as indicated  Follow up appointments reviewed:  PCP Hospital f/u appt confirmed? Yes  Scheduled to see PCP on 10/13/21 @ 10:30 Specialist  Hospital f/u appt confirmed? Yes  Scheduled to see oncology team tomorrow for final treatment on 10/08/21  @ "in the afternoon" Are transportation arrangements needed? No  If their condition worsens, is the pt aware to call PCP or go to the Emergency Dept.? Yes Was the patient provided with contact information for the PCP's office or ED? Yes Was to pt encouraged to call back with questions or concerns? Yes  SDOH assessments and interventions completed:   Yes  Care Coordination Interventions Activated:  No   Care Coordination Interventions:   N/A, no needs identified     Encounter Outcome:  Pt. Visit Completed    Oneta Rack, RN, BSN, [place] RN [place] Management [phone]: direct office

## 2021-10-08 DIAGNOSIS — Z5112 Encounter for antineoplastic immunotherapy: Secondary | ICD-10-CM | POA: Diagnosis not present

## 2021-10-08 DIAGNOSIS — C9000 Multiple myeloma not having achieved remission: Secondary | ICD-10-CM | POA: Diagnosis not present

## 2021-10-08 DIAGNOSIS — C9002 Multiple myeloma in relapse: Secondary | ICD-10-CM | POA: Diagnosis not present

## 2021-10-08 DIAGNOSIS — Z9484 Stem cells transplant status: Secondary | ICD-10-CM | POA: Diagnosis not present

## 2021-10-09 DIAGNOSIS — C9000 Multiple myeloma not having achieved remission: Secondary | ICD-10-CM | POA: Diagnosis not present

## 2021-10-10 DIAGNOSIS — C9000 Multiple myeloma not having achieved remission: Secondary | ICD-10-CM | POA: Diagnosis not present

## 2021-10-11 ENCOUNTER — Telehealth: Payer: Self-pay | Admitting: *Deleted

## 2021-10-11 ENCOUNTER — Encounter: Payer: Self-pay | Admitting: *Deleted

## 2021-10-11 DIAGNOSIS — C9002 Multiple myeloma in relapse: Secondary | ICD-10-CM | POA: Diagnosis not present

## 2021-10-11 DIAGNOSIS — Z5111 Encounter for antineoplastic chemotherapy: Secondary | ICD-10-CM | POA: Diagnosis not present

## 2021-10-11 DIAGNOSIS — Z9484 Stem cells transplant status: Secondary | ICD-10-CM | POA: Diagnosis not present

## 2021-10-11 DIAGNOSIS — M899 Disorder of bone, unspecified: Secondary | ICD-10-CM | POA: Diagnosis not present

## 2021-10-11 NOTE — Patient Outreach (Signed)
  Care Coordination TOC Note Transition Care Management Follow-up Telephone Call Date of discharge and from where: 10/10/21 [facility] How have you been since you were released from the hospital? "I am doing okay; I tolerated the last of the inpatient chemo treatments and everything is fine; I see my oncology specialist at [facility] this afternoon and my PCP on Wednesday morning; I am going to ask them both about making sure the rollator is ordered when I see them" Any questions or concerns? No  Items Reviewed: Did the pt receive and understand the discharge instructions provided? Yes  Medications obtained and verified? Yes  Other?  N/A Any new allergies since your discharge? No  Dietary orders reviewed? Yes Do you have support at home? Yes  wife assists as indicated  Home Care and Equipment/Supplies: Were home health services ordered? no If so, what is the name of the agency? N/A  Has the agency set up a time to come to the patient's home? not applicable Were any new equipment or medical supplies ordered?  Yes: rollator walker ordered previously by oncology team- patient has scheduled office visit this afternoon and verbalizes plans to follow up on status of order What is the name of the medical supply agency? "I am not sure, [facility] ordered it" Were you able to get the supplies/equipment? "Not yet, but I plan to ask them about it this afternoon; I am using my cane right now, and doing okay but I would like to have the rollator as a back up;" encouraged patient to follow up with oncology provider that ordered this equipment as per his verbalized plans, when he has office visit this afternoon; also made patient aware that PCP can facilitate placing order if needed and confirmed he plans to attend PCP office visit as scheduled 10/13/21 Do you have any questions related to the use of the equipment or supplies? No  Functional Questionnaire: (I = Independent and D = Dependent) ADLs: I  wife assists  as indicated  Bathing/Dressing- I  wife assists as indicated  Meal Prep- I  wife assists as indicated  Eating- I  Maintaining continence- I  Transferring/Ambulation- I  Managing Meds- I  wife assists as indicated  Follow up appointments reviewed:  PCP Hospital f/u appt confirmed? Yes  Scheduled to see PCP, Dr. Dianah Field on 10/13/21 @ 10:30 am Specialist Hospital f/u appt confirmed? Yes  Scheduled to see oncology provider at [facility] on 10/11/21 @ 3:15 pm Are transportation arrangements needed? No  If their condition worsens, is the pt aware to call PCP or go to the Emergency Dept.? Yes Was the patient provided with contact information for the PCP's office or ED? Yes Was to pt encouraged to call back with questions or concerns? Yes  SDOH assessments and interventions completed:   Yes  Care Coordination Interventions Activated:  No   Care Coordination Interventions:   No care coordination needs identified     Encounter Outcome:  Pt. Visit Completed    Oneta Rack, RN, BSN, CCRN Alumnus RN CM Care Coordination/ Transition of [place] Management [phone]: direct office

## 2021-10-12 DIAGNOSIS — N400 Enlarged prostate without lower urinary tract symptoms: Secondary | ICD-10-CM | POA: Diagnosis not present

## 2021-10-12 DIAGNOSIS — J45909 Unspecified asthma, uncomplicated: Secondary | ICD-10-CM | POA: Diagnosis not present

## 2021-10-12 DIAGNOSIS — J439 Emphysema, unspecified: Secondary | ICD-10-CM | POA: Diagnosis not present

## 2021-10-12 DIAGNOSIS — Z7952 Long term (current) use of systemic steroids: Secondary | ICD-10-CM | POA: Diagnosis not present

## 2021-10-12 DIAGNOSIS — K449 Diaphragmatic hernia without obstruction or gangrene: Secondary | ICD-10-CM | POA: Diagnosis not present

## 2021-10-12 DIAGNOSIS — H259 Unspecified age-related cataract: Secondary | ICD-10-CM | POA: Diagnosis not present

## 2021-10-12 DIAGNOSIS — G620 Drug-induced polyneuropathy: Secondary | ICD-10-CM | POA: Diagnosis not present

## 2021-10-12 DIAGNOSIS — Z87891 Personal history of nicotine dependence: Secondary | ICD-10-CM | POA: Diagnosis not present

## 2021-10-12 DIAGNOSIS — D630 Anemia in neoplastic disease: Secondary | ICD-10-CM | POA: Diagnosis not present

## 2021-10-12 DIAGNOSIS — Z7984 Long term (current) use of oral hypoglycemic drugs: Secondary | ICD-10-CM | POA: Diagnosis not present

## 2021-10-12 DIAGNOSIS — K219 Gastro-esophageal reflux disease without esophagitis: Secondary | ICD-10-CM | POA: Diagnosis not present

## 2021-10-12 DIAGNOSIS — E785 Hyperlipidemia, unspecified: Secondary | ICD-10-CM | POA: Diagnosis not present

## 2021-10-12 DIAGNOSIS — Z7951 Long term (current) use of inhaled steroids: Secondary | ICD-10-CM | POA: Diagnosis not present

## 2021-10-12 DIAGNOSIS — M549 Dorsalgia, unspecified: Secondary | ICD-10-CM | POA: Diagnosis not present

## 2021-10-12 DIAGNOSIS — L570 Actinic keratosis: Secondary | ICD-10-CM | POA: Diagnosis not present

## 2021-10-12 DIAGNOSIS — I447 Left bundle-branch block, unspecified: Secondary | ICD-10-CM | POA: Diagnosis not present

## 2021-10-12 DIAGNOSIS — Z79891 Long term (current) use of opiate analgesic: Secondary | ICD-10-CM | POA: Diagnosis not present

## 2021-10-12 DIAGNOSIS — M170 Bilateral primary osteoarthritis of knee: Secondary | ICD-10-CM | POA: Diagnosis not present

## 2021-10-12 DIAGNOSIS — M5412 Radiculopathy, cervical region: Secondary | ICD-10-CM | POA: Diagnosis not present

## 2021-10-12 DIAGNOSIS — I429 Cardiomyopathy, unspecified: Secondary | ICD-10-CM | POA: Diagnosis not present

## 2021-10-12 DIAGNOSIS — I2510 Atherosclerotic heart disease of native coronary artery without angina pectoris: Secondary | ICD-10-CM | POA: Diagnosis not present

## 2021-10-12 DIAGNOSIS — I951 Orthostatic hypotension: Secondary | ICD-10-CM | POA: Diagnosis not present

## 2021-10-12 DIAGNOSIS — I10 Essential (primary) hypertension: Secondary | ICD-10-CM | POA: Diagnosis not present

## 2021-10-12 DIAGNOSIS — C9002 Multiple myeloma in relapse: Secondary | ICD-10-CM | POA: Diagnosis not present

## 2021-10-12 DIAGNOSIS — G4733 Obstructive sleep apnea (adult) (pediatric): Secondary | ICD-10-CM | POA: Diagnosis not present

## 2021-10-13 ENCOUNTER — Ambulatory Visit (INDEPENDENT_AMBULATORY_CARE_PROVIDER_SITE_OTHER): Payer: Medicare Other | Admitting: Sports Medicine

## 2021-10-13 ENCOUNTER — Encounter: Payer: Self-pay | Admitting: Sports Medicine

## 2021-10-13 DIAGNOSIS — I10 Essential (primary) hypertension: Secondary | ICD-10-CM | POA: Diagnosis not present

## 2021-10-13 DIAGNOSIS — Z9861 Coronary angioplasty status: Secondary | ICD-10-CM

## 2021-10-13 DIAGNOSIS — I2510 Atherosclerotic heart disease of native coronary artery without angina pectoris: Secondary | ICD-10-CM | POA: Diagnosis not present

## 2021-10-13 NOTE — Assessment & Plan Note (Addendum)
[name] [name] returns, he continues to be somewhat weak, and have relative hypotension, 70/39 here in the office but 595G systolic at home. At the last visit we decreased his blood pressure medications, today we will go ahead and discontinue Entresto, continue to hold Lasix (weight is only up a few pounds from the last visit, and he denies PND or orthopnea), decrease metoprolol to 25 from 50. We did decrease his Flomax, he is only on Flomax once daily but has some difficulty voiding with significant hesitancy, so we will leave this alone for now. His hemoglobin is also in the nines, this can contribute, he is currently on a new chemotherapy for multiple myeloma. Once he is through all of this we may need to restart low-dose valsartan alone for his CHF metrics as I do not think he will tolerate Entresto anytime soon.

## 2021-10-13 NOTE — Progress Notes (Addendum)
    Procedures performed today:    None.  Independent interpretation of notes and tests performed by another provider:   None.  Brief History, Exam, Impression, and Recommendations:    Essential hypertension, benign [name] [name] returns, he continues to be somewhat weak, and have relative hypotension, 70/39 here in the office but 964R systolic at home. At the last visit we decreased his blood pressure medications, today we will go ahead and discontinue Entresto, continue to hold Lasix (weight is only up a few pounds from the last visit, and he denies PND or orthopnea), decrease metoprolol to 25 from 50. We did decrease his Flomax, he is only on Flomax once daily but has some difficulty voiding with significant hesitancy, so we will leave this alone for now. His hemoglobin is also in the nines, this can contribute, he is currently on a new chemotherapy for multiple myeloma. Once he is through all of this we may need to restart low-dose valsartan alone for his CHF metrics as I do not think he will tolerate Entresto anytime soon.    ____________________________________________ Gwen Her. Dianah Field, M.D., ABFM., CAQSM., AME. Primary Care and Sports Medicine [facility]  Adjunct Professor of [place] of [facility] of Medicine  Risk manager

## 2021-10-14 ENCOUNTER — Other Ambulatory Visit (HOSPITAL_COMMUNITY): Payer: Self-pay

## 2021-10-14 DIAGNOSIS — C9002 Multiple myeloma in relapse: Secondary | ICD-10-CM | POA: Diagnosis not present

## 2021-10-19 DIAGNOSIS — Z9484 Stem cells transplant status: Secondary | ICD-10-CM | POA: Diagnosis not present

## 2021-10-19 DIAGNOSIS — C9002 Multiple myeloma in relapse: Secondary | ICD-10-CM | POA: Diagnosis not present

## 2021-10-19 DIAGNOSIS — Z5111 Encounter for antineoplastic chemotherapy: Secondary | ICD-10-CM | POA: Diagnosis not present

## 2021-10-19 DIAGNOSIS — M899 Disorder of bone, unspecified: Secondary | ICD-10-CM | POA: Diagnosis not present

## 2021-10-20 DIAGNOSIS — D630 Anemia in neoplastic disease: Secondary | ICD-10-CM | POA: Diagnosis not present

## 2021-10-20 DIAGNOSIS — C9002 Multiple myeloma in relapse: Secondary | ICD-10-CM | POA: Diagnosis not present

## 2021-10-20 DIAGNOSIS — N400 Enlarged prostate without lower urinary tract symptoms: Secondary | ICD-10-CM | POA: Diagnosis not present

## 2021-10-20 DIAGNOSIS — I10 Essential (primary) hypertension: Secondary | ICD-10-CM | POA: Diagnosis not present

## 2021-10-20 DIAGNOSIS — H259 Unspecified age-related cataract: Secondary | ICD-10-CM | POA: Diagnosis not present

## 2021-10-20 DIAGNOSIS — J439 Emphysema, unspecified: Secondary | ICD-10-CM | POA: Diagnosis not present

## 2021-10-22 ENCOUNTER — Other Ambulatory Visit: Payer: Self-pay | Admitting: Sports Medicine

## 2021-10-22 DIAGNOSIS — H259 Unspecified age-related cataract: Secondary | ICD-10-CM | POA: Diagnosis not present

## 2021-10-22 DIAGNOSIS — J439 Emphysema, unspecified: Secondary | ICD-10-CM | POA: Diagnosis not present

## 2021-10-22 DIAGNOSIS — I10 Essential (primary) hypertension: Secondary | ICD-10-CM | POA: Diagnosis not present

## 2021-10-22 DIAGNOSIS — M47815 Spondylosis without myelopathy or radiculopathy, thoracolumbar region: Secondary | ICD-10-CM

## 2021-10-22 DIAGNOSIS — N400 Enlarged prostate without lower urinary tract symptoms: Secondary | ICD-10-CM | POA: Diagnosis not present

## 2021-10-22 DIAGNOSIS — C9002 Multiple myeloma in relapse: Secondary | ICD-10-CM | POA: Diagnosis not present

## 2021-10-22 DIAGNOSIS — D630 Anemia in neoplastic disease: Secondary | ICD-10-CM | POA: Diagnosis not present

## 2021-10-26 DIAGNOSIS — I10 Essential (primary) hypertension: Secondary | ICD-10-CM | POA: Diagnosis not present

## 2021-10-26 DIAGNOSIS — H259 Unspecified age-related cataract: Secondary | ICD-10-CM | POA: Diagnosis not present

## 2021-10-26 DIAGNOSIS — C9002 Multiple myeloma in relapse: Secondary | ICD-10-CM | POA: Diagnosis not present

## 2021-10-26 DIAGNOSIS — N400 Enlarged prostate without lower urinary tract symptoms: Secondary | ICD-10-CM | POA: Diagnosis not present

## 2021-10-26 DIAGNOSIS — J439 Emphysema, unspecified: Secondary | ICD-10-CM | POA: Diagnosis not present

## 2021-10-26 DIAGNOSIS — D630 Anemia in neoplastic disease: Secondary | ICD-10-CM | POA: Diagnosis not present

## 2021-10-27 ENCOUNTER — Encounter: Payer: Self-pay | Admitting: Sports Medicine

## 2021-10-27 ENCOUNTER — Ambulatory Visit (INDEPENDENT_AMBULATORY_CARE_PROVIDER_SITE_OTHER): Payer: Medicare Other | Admitting: Sports Medicine

## 2021-10-27 DIAGNOSIS — I10 Essential (primary) hypertension: Secondary | ICD-10-CM

## 2021-10-27 DIAGNOSIS — R5383 Other fatigue: Secondary | ICD-10-CM | POA: Insufficient documentation

## 2021-10-27 DIAGNOSIS — Z9861 Coronary angioplasty status: Secondary | ICD-10-CM

## 2021-10-27 DIAGNOSIS — I2510 Atherosclerotic heart disease of native coronary artery without angina pectoris: Secondary | ICD-10-CM

## 2021-10-27 DIAGNOSIS — R530 Neoplastic (malignant) related fatigue: Secondary | ICD-10-CM

## 2021-10-27 DIAGNOSIS — M47815 Spondylosis without myelopathy or radiculopathy, thoracolumbar region: Secondary | ICD-10-CM | POA: Diagnosis not present

## 2021-10-27 MED ORDER — XTAMPZA ER 9 MG PO C12A: 1.0000 | EXTENDED_RELEASE_CAPSULE | Freq: Two times a day (BID) | ORAL | 0 refills | Status: DC

## 2021-10-27 MED ORDER — AMPHETAMINE-DEXTROAMPHET ER 10 MG PO CP24: 10.0000 mg | ORAL_CAPSULE | Freq: Every day | ORAL | 0 refills | Status: DC

## 2021-10-27 MED ORDER — OXYCODONE-ACETAMINOPHEN 10-325 MG PO TABS: ORAL_TABLET | ORAL | 0 refills | Status: DC

## 2021-10-27 NOTE — Progress Notes (Signed)
    Procedures performed today:    None.  Independent interpretation of notes and tests performed by another provider:   None.  Brief History, Exam, Impression, and Recommendations:    Essential hypertension, benign This pleasant 81 year old male returns, we saw him at the last visit and he was hypotensive to the 03L systolic, 944 systolic at home, feeling weak and orthostatic. We had dialed back his blood pressure medicines prior, at the last visit we fully discontinued Entresto, and we held Lasix. We kept his Flomax at once daily down from twice daily. He seems to have tolerated the above well, blood pressures in the 461 systolic now and feeling a lot better from a hypertension and orthostatic standpoint. I do not think he will tolerate Entresto for now while he is getting his cancer treatments. He did restart his Lasix a bit as he noticed some weight gain, only has trace to 1+ pitting edema in both legs and symmetric. No changes in plan for now. Of note we may need to restart very low-dose valsartan for CHF metrics in the future.  Fatigue [name] [name] is also having marked fatigue, he is going through cancer treatments. Couple of cups of coffee today are not effective, adding low-dose Adderall for 2 weeks with a dose titration.  Chronic process with exacerbation and pharmacologic intervention  ____________________________________________ Gwen Her. Dianah Field, M.D., ABFM., CAQSM., AME. Primary Care and Sports Medicine [place] [facility]  Adjunct Professor of [place] of [facility] of Medicine  Risk manager

## 2021-10-27 NOTE — Assessment & Plan Note (Signed)
[name] [name] is also having marked fatigue, he is going through cancer treatments. Couple of cups of coffee today are not effective, adding low-dose Adderall for 2 weeks with a dose titration.

## 2021-10-27 NOTE — Assessment & Plan Note (Signed)
This pleasant 81 year old male returns, we saw him at the last visit and he was hypotensive to the 28V systolic, 791 systolic at home, feeling weak and orthostatic. We had dialed back his blood pressure medicines prior, at the last visit we fully discontinued Entresto, and we held Lasix. We kept his Flomax at once daily down from twice daily. He seems to have tolerated the above well, blood pressures in the 504 systolic now and feeling a lot better from a hypertension and orthostatic standpoint. I do not think he will tolerate Entresto for now while he is getting his cancer treatments. He did restart his Lasix a bit as he noticed some weight gain, only has trace to 1+ pitting edema in both legs and symmetric. No changes in plan for now. Of note we may need to restart very low-dose valsartan for CHF metrics in the future.

## 2021-10-28 DIAGNOSIS — J439 Emphysema, unspecified: Secondary | ICD-10-CM | POA: Diagnosis not present

## 2021-10-28 DIAGNOSIS — H259 Unspecified age-related cataract: Secondary | ICD-10-CM | POA: Diagnosis not present

## 2021-10-28 DIAGNOSIS — N400 Enlarged prostate without lower urinary tract symptoms: Secondary | ICD-10-CM | POA: Diagnosis not present

## 2021-10-28 DIAGNOSIS — C9002 Multiple myeloma in relapse: Secondary | ICD-10-CM | POA: Diagnosis not present

## 2021-10-28 DIAGNOSIS — I10 Essential (primary) hypertension: Secondary | ICD-10-CM | POA: Diagnosis not present

## 2021-10-28 DIAGNOSIS — D630 Anemia in neoplastic disease: Secondary | ICD-10-CM | POA: Diagnosis not present

## 2021-11-02 DIAGNOSIS — I427 Cardiomyopathy due to drug and external agent: Secondary | ICD-10-CM | POA: Diagnosis not present

## 2021-11-02 DIAGNOSIS — Z87891 Personal history of nicotine dependence: Secondary | ICD-10-CM | POA: Diagnosis not present

## 2021-11-02 DIAGNOSIS — M899 Disorder of bone, unspecified: Secondary | ICD-10-CM | POA: Diagnosis not present

## 2021-11-02 DIAGNOSIS — Z5112 Encounter for antineoplastic immunotherapy: Secondary | ICD-10-CM | POA: Diagnosis not present

## 2021-11-02 DIAGNOSIS — M7989 Other specified soft tissue disorders: Secondary | ICD-10-CM | POA: Diagnosis not present

## 2021-11-02 DIAGNOSIS — Z5111 Encounter for antineoplastic chemotherapy: Secondary | ICD-10-CM | POA: Diagnosis not present

## 2021-11-02 DIAGNOSIS — Z833 Family history of diabetes mellitus: Secondary | ICD-10-CM | POA: Diagnosis not present

## 2021-11-02 DIAGNOSIS — I110 Hypertensive heart disease with heart failure: Secondary | ICD-10-CM | POA: Diagnosis not present

## 2021-11-02 DIAGNOSIS — Z800 Family history of malignant neoplasm of digestive organs: Secondary | ICD-10-CM | POA: Diagnosis not present

## 2021-11-02 DIAGNOSIS — R351 Nocturia: Secondary | ICD-10-CM | POA: Diagnosis not present

## 2021-11-02 DIAGNOSIS — M898X8 Other specified disorders of bone, other site: Secondary | ICD-10-CM | POA: Diagnosis not present

## 2021-11-02 DIAGNOSIS — C9002 Multiple myeloma in relapse: Secondary | ICD-10-CM | POA: Diagnosis not present

## 2021-11-02 DIAGNOSIS — R5383 Other fatigue: Secondary | ICD-10-CM | POA: Diagnosis not present

## 2021-11-02 DIAGNOSIS — Z803 Family history of malignant neoplasm of breast: Secondary | ICD-10-CM | POA: Diagnosis not present

## 2021-11-02 DIAGNOSIS — I509 Heart failure, unspecified: Secondary | ICD-10-CM | POA: Diagnosis not present

## 2021-11-02 DIAGNOSIS — Z79899 Other long term (current) drug therapy: Secondary | ICD-10-CM | POA: Diagnosis not present

## 2021-11-02 DIAGNOSIS — T451X5A Adverse effect of antineoplastic and immunosuppressive drugs, initial encounter: Secondary | ICD-10-CM | POA: Diagnosis not present

## 2021-11-02 DIAGNOSIS — G620 Drug-induced polyneuropathy: Secondary | ICD-10-CM | POA: Diagnosis not present

## 2021-11-02 DIAGNOSIS — Z9484 Stem cells transplant status: Secondary | ICD-10-CM | POA: Diagnosis not present

## 2021-11-03 DIAGNOSIS — I10 Essential (primary) hypertension: Secondary | ICD-10-CM | POA: Diagnosis not present

## 2021-11-03 DIAGNOSIS — D630 Anemia in neoplastic disease: Secondary | ICD-10-CM | POA: Diagnosis not present

## 2021-11-03 DIAGNOSIS — N400 Enlarged prostate without lower urinary tract symptoms: Secondary | ICD-10-CM | POA: Diagnosis not present

## 2021-11-03 DIAGNOSIS — J439 Emphysema, unspecified: Secondary | ICD-10-CM | POA: Diagnosis not present

## 2021-11-03 DIAGNOSIS — C9002 Multiple myeloma in relapse: Secondary | ICD-10-CM | POA: Diagnosis not present

## 2021-11-03 DIAGNOSIS — H259 Unspecified age-related cataract: Secondary | ICD-10-CM | POA: Diagnosis not present

## 2021-11-05 DIAGNOSIS — D630 Anemia in neoplastic disease: Secondary | ICD-10-CM | POA: Diagnosis not present

## 2021-11-05 DIAGNOSIS — C9002 Multiple myeloma in relapse: Secondary | ICD-10-CM | POA: Diagnosis not present

## 2021-11-05 DIAGNOSIS — H259 Unspecified age-related cataract: Secondary | ICD-10-CM | POA: Diagnosis not present

## 2021-11-05 DIAGNOSIS — I10 Essential (primary) hypertension: Secondary | ICD-10-CM | POA: Diagnosis not present

## 2021-11-05 DIAGNOSIS — J439 Emphysema, unspecified: Secondary | ICD-10-CM | POA: Diagnosis not present

## 2021-11-05 DIAGNOSIS — N400 Enlarged prostate without lower urinary tract symptoms: Secondary | ICD-10-CM | POA: Diagnosis not present

## 2021-11-08 DIAGNOSIS — H259 Unspecified age-related cataract: Secondary | ICD-10-CM | POA: Diagnosis not present

## 2021-11-08 DIAGNOSIS — J439 Emphysema, unspecified: Secondary | ICD-10-CM | POA: Diagnosis not present

## 2021-11-08 DIAGNOSIS — C9002 Multiple myeloma in relapse: Secondary | ICD-10-CM | POA: Diagnosis not present

## 2021-11-08 DIAGNOSIS — I10 Essential (primary) hypertension: Secondary | ICD-10-CM | POA: Diagnosis not present

## 2021-11-08 DIAGNOSIS — N400 Enlarged prostate without lower urinary tract symptoms: Secondary | ICD-10-CM | POA: Diagnosis not present

## 2021-11-08 DIAGNOSIS — D630 Anemia in neoplastic disease: Secondary | ICD-10-CM | POA: Diagnosis not present

## 2021-11-09 DIAGNOSIS — Z7952 Long term (current) use of systemic steroids: Secondary | ICD-10-CM | POA: Diagnosis not present

## 2021-11-09 DIAGNOSIS — C9002 Multiple myeloma in relapse: Secondary | ICD-10-CM | POA: Diagnosis not present

## 2021-11-10 ENCOUNTER — Encounter: Payer: Self-pay | Admitting: Sports Medicine

## 2021-11-10 ENCOUNTER — Ambulatory Visit (INDEPENDENT_AMBULATORY_CARE_PROVIDER_SITE_OTHER): Payer: Medicare Other | Admitting: Sports Medicine

## 2021-11-10 DIAGNOSIS — I10 Essential (primary) hypertension: Secondary | ICD-10-CM | POA: Diagnosis not present

## 2021-11-10 DIAGNOSIS — N401 Enlarged prostate with lower urinary tract symptoms: Secondary | ICD-10-CM | POA: Diagnosis not present

## 2021-11-10 DIAGNOSIS — R530 Neoplastic (malignant) related fatigue: Secondary | ICD-10-CM

## 2021-11-10 DIAGNOSIS — C9000 Multiple myeloma not having achieved remission: Secondary | ICD-10-CM

## 2021-11-10 DIAGNOSIS — N138 Other obstructive and reflux uropathy: Secondary | ICD-10-CM | POA: Diagnosis not present

## 2021-11-10 DIAGNOSIS — Z9861 Coronary angioplasty status: Secondary | ICD-10-CM | POA: Diagnosis not present

## 2021-11-10 DIAGNOSIS — I2510 Atherosclerotic heart disease of native coronary artery without angina pectoris: Secondary | ICD-10-CM | POA: Diagnosis not present

## 2021-11-10 MED ORDER — TAMSULOSIN HCL 0.4 MG PO CAPS: 0.4000 mg | ORAL_CAPSULE | Freq: Two times a day (BID) | ORAL | 3 refills | Status: DC

## 2021-11-10 MED ORDER — AMPHETAMINE-DEXTROAMPHET ER 20 MG PO CP24: 20.0000 mg | ORAL_CAPSULE | Freq: Every day | ORAL | 0 refills | Status: DC

## 2021-11-10 MED ORDER — PROCHLORPERAZINE MALEATE 10 MG PO TABS: 10.0000 mg | ORAL_TABLET | Freq: Four times a day (QID) | ORAL | 1 refills | Status: DC | PRN

## 2021-11-10 NOTE — Assessment & Plan Note (Signed)
Blood pressure not adequately controlled, we did dial back his blood pressure medications including fully discontinuing Entresto, minimizing Lasix, we dropped Flomax down to once daily. Increasing obstructive uropathy symptoms so we will bump his Flomax back up to twice daily, no other changes.

## 2021-11-10 NOTE — Assessment & Plan Note (Signed)
Intolerable obstructive uropathy symptoms after the drop down to Flomax once daily, increasing back to twice daily. Blood pressures have been looking better lately.

## 2021-11-10 NOTE — Assessment & Plan Note (Signed)
Marked fatigue currently going through chemotherapy, caffeine not effective, I added low-dose Adderall, increasing to 20 mg daily as he has noted good improvement but would like a little bit more of a boost. We will do a month supply this time.

## 2021-11-10 NOTE — Progress Notes (Signed)
    Procedures performed today:    None.  Independent interpretation of notes and tests performed by another provider:   None.  Brief History, Exam, Impression, and Recommendations:    Essential hypertension, benign Blood pressure not adequately controlled, we did dial back his blood pressure medications including fully discontinuing Entresto, minimizing Lasix, we dropped Flomax down to once daily. Increasing obstructive uropathy symptoms so we will bump his Flomax back up to twice daily, no other changes.  Fatigue Marked fatigue currently going through chemotherapy, caffeine not effective, I added low-dose Adderall, increasing to 20 mg daily as he has noted good improvement but would like a little bit more of a boost. We will do a month supply this time.  Benign prostatic hyperplasia with urinary obstruction Intolerable obstructive uropathy symptoms after the drop down to Flomax once daily, increasing back to twice daily. Blood pressures have been looking better lately.  Chronic process not at goal with pharmacologic intervention  ____________________________________________ Gwen Her. Dianah Field, M.D., ABFM., CAQSM., AME. Primary Care and Sports Medicine  [facility]  Adjunct Professor of [place] of [facility] of Medicine  Risk manager

## 2021-11-11 DIAGNOSIS — I255 Ischemic cardiomyopathy: Secondary | ICD-10-CM | POA: Diagnosis not present

## 2021-11-11 DIAGNOSIS — I480 Paroxysmal atrial fibrillation: Secondary | ICD-10-CM | POA: Diagnosis not present

## 2021-11-11 DIAGNOSIS — Z86711 Personal history of pulmonary embolism: Secondary | ICD-10-CM | POA: Diagnosis not present

## 2021-11-11 DIAGNOSIS — Z7901 Long term (current) use of anticoagulants: Secondary | ICD-10-CM | POA: Diagnosis not present

## 2021-11-11 DIAGNOSIS — C9000 Multiple myeloma not having achieved remission: Secondary | ICD-10-CM | POA: Diagnosis not present

## 2021-11-11 DIAGNOSIS — I2510 Atherosclerotic heart disease of native coronary artery without angina pectoris: Secondary | ICD-10-CM | POA: Diagnosis not present

## 2021-11-11 DIAGNOSIS — Z955 Presence of coronary angioplasty implant and graft: Secondary | ICD-10-CM | POA: Diagnosis not present

## 2021-11-16 DIAGNOSIS — Z5111 Encounter for antineoplastic chemotherapy: Secondary | ICD-10-CM | POA: Diagnosis not present

## 2021-11-16 DIAGNOSIS — C9002 Multiple myeloma in relapse: Secondary | ICD-10-CM | POA: Diagnosis not present

## 2021-11-23 DIAGNOSIS — Z79620 Long term (current) use of immunosuppressive biologic: Secondary | ICD-10-CM | POA: Diagnosis not present

## 2021-11-23 DIAGNOSIS — Z5112 Encounter for antineoplastic immunotherapy: Secondary | ICD-10-CM | POA: Diagnosis not present

## 2021-11-23 DIAGNOSIS — C9002 Multiple myeloma in relapse: Secondary | ICD-10-CM | POA: Diagnosis not present

## 2021-11-29 ENCOUNTER — Encounter: Payer: Self-pay | Admitting: Sports Medicine

## 2021-11-29 DIAGNOSIS — M47815 Spondylosis without myelopathy or radiculopathy, thoracolumbar region: Secondary | ICD-10-CM

## 2021-11-29 MED ORDER — XTAMPZA ER 9 MG PO C12A: 1.0000 | EXTENDED_RELEASE_CAPSULE | Freq: Two times a day (BID) | ORAL | 0 refills | Status: DC

## 2021-11-30 DIAGNOSIS — M47816 Spondylosis without myelopathy or radiculopathy, lumbar region: Secondary | ICD-10-CM | POA: Diagnosis not present

## 2021-11-30 DIAGNOSIS — C9002 Multiple myeloma in relapse: Secondary | ICD-10-CM | POA: Diagnosis not present

## 2021-12-01 ENCOUNTER — Ambulatory Visit (INDEPENDENT_AMBULATORY_CARE_PROVIDER_SITE_OTHER): Payer: Medicare Other | Admitting: Sports Medicine

## 2021-12-01 DIAGNOSIS — Z23 Encounter for immunization: Secondary | ICD-10-CM | POA: Diagnosis not present

## 2021-12-07 DIAGNOSIS — Z7901 Long term (current) use of anticoagulants: Secondary | ICD-10-CM | POA: Diagnosis not present

## 2021-12-07 DIAGNOSIS — R530 Neoplastic (malignant) related fatigue: Secondary | ICD-10-CM | POA: Diagnosis not present

## 2021-12-07 DIAGNOSIS — C9002 Multiple myeloma in relapse: Secondary | ICD-10-CM | POA: Diagnosis not present

## 2021-12-07 DIAGNOSIS — M899 Disorder of bone, unspecified: Secondary | ICD-10-CM | POA: Diagnosis not present

## 2021-12-07 DIAGNOSIS — Z5112 Encounter for antineoplastic immunotherapy: Secondary | ICD-10-CM | POA: Diagnosis not present

## 2021-12-10 ENCOUNTER — Ambulatory Visit (INDEPENDENT_AMBULATORY_CARE_PROVIDER_SITE_OTHER): Payer: Medicare Other | Admitting: Sports Medicine

## 2021-12-10 DIAGNOSIS — Z0000 Encounter for general adult medical examination without abnormal findings: Secondary | ICD-10-CM

## 2021-12-10 NOTE — Progress Notes (Signed)
MEDICARE ANNUAL WELLNESS VISIT  12/10/2021  Telephone Visit Disclaimer This Medicare AWV was conducted by telephone due to national recommendations for restrictions regarding the COVID-19 Pandemic (e.g. social distancing).  I verified, using two identifiers, that I am speaking with [name] [name] or their authorized healthcare agent. I discussed the limitations, risks, security, and privacy concerns of performing an evaluation and management service by telephone and the potential availability of an in-person appointment in the future. The patient expressed understanding and agreed to proceed.  Location of Patient: Home Location of Provider (nurse):  in the office.  Subjective:    [name] [name] is a 81 y.o. male patient of [name], Gwen Her, MD who had a Medicare Annual Wellness Visit today via telephone. [name] [name] is Retired and lives with their spouse. he has 3 children. he reports that he is socially active and does interact with friends/family regularly. he is minimally physically active and enjoys playing cards with his wife.  Patient Care Team: Silverio Decamp, MD as PCP - General (Family Medicine)     12/10/2021    8:53 AM 08/13/2021    1:09 PM 07/09/2021   11:52 AM 06/08/2021   12:16 PM 05/06/2021   11:16 AM 03/25/2021   12:11 PM 02/18/2021    2:47 PM  Advanced Directives  Does Patient Have a Medical Advance Directive? _0  Yes Yes  Type of Advance Directive Living will Living will;Healthcare Power of [place];Living will [place];Living will [place];Living will [place];Living will   Does patient want to make changes to medical advance directive? No - Patient declined No - Patient declined  No - Patient declined No - Patient declined No - Patient declined No - Patient declined  Copy of [place] in Chart?  No - copy requested No - copy requested  No - copy requested No - copy requested No - copy requested   Would patient like information on creating a medical advance directive?  No - Patient declined No - Patient declined No - Patient declined No - Patient declined No - Patient declined     Hospital Utilization Over the Past 12 Months: # of hospitalizations or ER visits: 2 # of surgeries: 8  Review of Systems    Patient reports that his overall health is better compared to last year.  History obtained from chart review and the patient  Patient Reported Readings (BP, Pulse, CBG, Weight, etc) none  Pain Assessment Pain : 0-10 Pain Score: 4  Pain Type: Chronic pain Pain Location: Generalized Pain Descriptors / Indicators: Aching Pain Onset: More than a month ago Pain Frequency: Constant     Current Medications & Allergies (verified) Allergies as of 12/10/2021       Reactions   Budesonide-formoterol Fumarate Itching, Other (See Comments)   Codeine Itching   Hydrocodone-acetaminophen Itching   Mushroom Extract Complex Nausea And Vomiting, Other (See Comments)   Only shitake mushroom  causes this reaction. Flu-like symptoms from portabello mushrooms        Medication List        Accurate as of December 10, 2021  9:05 AM. If you have any questions, ask your nurse or doctor.          amphetamine-dextroamphetamine 20 MG 24 hr capsule Commonly known as: Adderall XR Take 1 capsule (20 mg total) by mouth daily.   amphetamine-dextroamphetamine 10 MG 24 hr capsule Commonly known as:  ADDERALL XR Take 10 mg by mouth daily.   cetirizine 10 MG tablet Commonly known as: ZYRTEC TAKE 1 TABLET EVERY DAY   Cyanocobalamin 1000 MCG Tbcr Take 1 tablet by mouth daily.   empagliflozin 10 MG Tabs tablet Commonly known as: JARDIANCE Take by mouth daily.   esomeprazole 40 MG capsule Commonly known as: NEXIUM TAKE 1 CAPSULE EVERY DAY AT 12:00 PM   famciclovir 500 MG tablet Commonly known as: FAMVIR TAKE 1 TABLET  (500 MG TOTAL) BY MOUTH DAILY.   fluticasone 50 MCG/ACT nasal spray Commonly known as: FLONASE Place 2 sprays into both nostrils daily.   gabapentin 600 MG tablet Commonly known as: NEURONTIN TAKE 1 TABLET THREE TIMES DAILY   levothyroxine 100 MCG tablet Commonly known as: SYNTHROID TAKE 1 TABLET EVERY DAY BEFORE BREAKFAST   metoprolol succinate 50 MG 24 hr tablet Commonly known as: TOPROL-XL Take 0.5 tablets (25 mg total) by mouth daily.   nitroGLYCERIN 0.4 MG SL tablet Commonly known as: NITROSTAT PLACE 1 TABLET UNDER THE TONGUE EVERY 5 (FIVE) MINUTES AS NEEDED FOR CHEST PAIN AS DIRECTED   ondansetron 8 MG tablet Commonly known as: ZOFRAN Take 1 tablet (8 mg total) by mouth every 8 (eight) hours as needed for nausea or vomiting.   oxyCODONE-acetaminophen 10-325 MG tablet Commonly known as: PERCOCET 1 tab 1-2 times daily as needed in addition to OxyContin.   potassium chloride SA 20 MEQ tablet Commonly known as: KLOR-CON M TAKE 1 TABLET TWICE DAILY   prochlorperazine 10 MG tablet Commonly known as: COMPAZINE Take 1 tablet (10 mg total) by mouth every 6 (six) hours as needed (Nausea or vomiting).   pyridoxine 100 MG tablet Commonly known as: B-6 Take 100 mg by mouth daily.   Repatha SureClick 518 MG/ML Soaj Generic drug: Evolocumab Inject 140 mg into the skin every 14 (fourteen) days.   tamsulosin 0.4 MG Caps capsule Commonly known as: FLOMAX Take 1 capsule (0.4 mg total) by mouth in the morning and at bedtime.   Xarelto 15 MG Tabs tablet Generic drug: Rivaroxaban Take by mouth daily.   Xtampza ER 9 MG C12a Generic drug: oxyCODONE ER Take 1 capsule by mouth in the morning and at bedtime.   Zoledronic Acid 4 MG Solr Inject into the vein.        History (reviewed): Past Medical History:  Diagnosis Date   Acute deep vein thrombosis (DVT) of tibial vein of right lower extremity (HCC) 09/29/2014   Acute on chronic renal insufficiency 11/26/2020    Arthritis    back   Asthma    uses Advair bid and asmanex prn   Cancer ([place])    multiple myeloma   Cataract 2019   CHF (congestive heart failure) ([place]) 2019   Chronic back pain    buldging disc and scoliosis   Chronic bronchitis (HCC)    couple of yrs ago   Complication of anesthesia    severe case of hiccups after hernia surgery;required medication   Diverticulosis    Dizziness    related to neck issues   Emphysema    Emphysema of lung ([place]) 2015   Enlarged prostate    takes Flomax daily   GERD (gastroesophageal reflux disease)    takes PRotonix daily   History of colon polyps    Hyperlipidemia    takes Zocor daily   Hypertension    takes HCTZ,Diovan daily and Isosorbide daily   Itching    down left arm and leg   Joint  pain    Macular degeneration    mild,beginning   Multiple myeloma ([place]) 08/13/2014   Myocardial infarction ([place]) 2010   Neck pain    Neuromuscular disorder ([place]) 2017   Osteoporosis 2015   Pain in limb 06/20/2012   Pneumonia    hx of---12+yrs ago   Sleep apnea 2015   Thyroid disease 2017   Past Surgical History:  Procedure Laterality Date   ANTERIOR CERVICAL DECOMP/DISCECTOMY FUSION  01/31/2012   Procedure: ANTERIOR CERVICAL DECOMPRESSION/DISCECTOMY FUSION 1 LEVEL;  Surgeon: Floyce Stakes, MD;  Location: [facility];  Service: Neurosurgery;  Laterality: N/A;  Cervical Five-Six  Anterior Cervical Decompression /Diskectomy /Fusion/Plate   BONE MARROW TRANSPLANT     CARDIAC CATHETERIZATION  2010/2013   06/07/11 [facility]) LAD 25%, D2 75% (small), pLCx 100%, RCA 25%--medical managment    COLONOSCOPY     HERNIA REPAIR  2006   x 3    salivary gland removed     left    skin cancer removed from left side of face     basal cell carcinoma   vein removed from left leg     Family History  Problem Relation Age of Onset   Hypertension Mother    Heart disease Mother    Varicose Veins Mother    Heart attack Father    Arthritis Father    COPD Father     Hearing loss Father    Stroke Father    Cancer Sister    Cancer Sister    Social History   Socioeconomic History   Marital status: Married    Spouse name: Verdis Frederickson   Number of children: 3   Years of education: 14   Highest education level: Associate degree: academic program  Occupational History   Occupation: Retired.  Tobacco Use   Smoking status: Former    Packs/day: 0.25    Years: 30.00    Total pack years: 7.50    Types: Cigarettes, Pipe, Cigars    Quit date: 11/24/2011    Years since quitting: 10.0   Smokeless tobacco: Former    Quit date: 02/15/2011   Tobacco comments:    [place].  Vaping Use   Vaping Use: Never used  Substance and Sexual Activity   Alcohol use: Not Currently    Comment: one glass wine periodically   Drug use: No   Sexual activity: Not Currently  Other Topics Concern   Not on file  Social History Narrative   Lives with his wife. He enjoys playing games on the computer and playing cards with his wife for about two hours a day. Due to a recent heart attack, he is not able to play golf anymore.   Social Determinants of Health   Financial Resource Strain: Low Risk  (12/10/2021)   Overall Financial Resource Strain (CARDIA)    Difficulty of Paying Living Expenses: Not hard at all  Food Insecurity: No Food Insecurity (12/10/2021)   Hunger Vital Sign    Worried About Running Out of Food in the Last Year: Never true    Ran Out of Food in the Last Year: Never true  Transportation Needs: No Transportation Needs (12/10/2021)   PRAPARE - Hydrologist (Medical): No    Lack of Transportation (Non-Medical): No  Physical Activity: Inactive (12/10/2021)   Exercise Vital Sign    Days of Exercise per Week: 0 days    Minutes of Exercise per Session: 0 min  Stress:  No Stress Concern Present (12/10/2021)   [employer] [place]    Feeling of Stress : Not at all   Social Connections: Socially Isolated (12/10/2021)   Social Connection and Isolation Panel [NHANES]    Frequency of Communication with Friends and Family: Once a week    Frequency of Social Gatherings with Friends and Family: Never    Attends Religious Services: Never    Marine scientist or Organizations: No    Attends Archivist Meetings: Never    Marital Status: Married    Activities of Daily Living    12/10/2021    8:56 AM  In your present state of health, do you have any difficulty performing the following activities:  Hearing? 0  Vision? 0  Difficulty concentrating or making decisions? 1  Comment some memory loss  Walking or climbing stairs? 0  Dressing or bathing? 0  Doing errands, shopping? 0  Preparing Food and eating ? N  Using the Toilet? N  In the past six months, have you accidently leaked urine? N  Do you have problems with loss of bowel control? N  Managing your Medications? N  Managing your Finances? N  Housekeeping or managing your Housekeeping? N    Patient Education/ Literacy How often do you need to have someone help you when you read instructions, pamphlets, or other written materials from your doctor or pharmacy?: 1 - Never What is the last grade level you completed in school?: 2 years of college/technical degree  Exercise Current Exercise Habits: The patient does not participate in regular exercise at present, Exercise limited by: None identified  Diet Patient reports consuming 1 meals a day and 2 snack(s) a day Patient reports that his primary diet is: Regular Patient reports that she does have regular access to food.   Depression Screen    12/10/2021    8:55 AM 06/10/2021   10:51 AM 09/16/2020    9:55 AM 10/23/2019   10:53 AM 10/08/2018    2:08 PM 12/26/2017    1:35 PM 12/07/2016    9:13 AM  PHQ 2/9 Scores  PHQ - 2 Score 0 0 0 0 1 0 0     Fall Risk    12/10/2021    8:54 AM 06/10/2021   10:51 AM 09/16/2020    9:54 AM  01/08/2019   10:14 AM 12/29/2017   11:50 AM  Fall Risk   Falls in the past year? 1 0 1 0 0  Comment    Emmi Telephone Survey: data to providers prior to load Emmi Telephone Survey: data to providers prior to load  Number falls in past yr: 1 0 0    Injury with Fall? 1 0 0    Risk for fall due to : History of fall(s);Mental status change;Medication side effect  Impaired balance/gait;History of fall(s)    Follow up Falls evaluation completed;Education provided;Falls prevention discussed  Falls evaluation completed;Education provided       Objective:  [name] [name] seemed alert and oriented and he participated appropriately during our telephone visit.  Blood Pressure Weight BMI  BP Readings from Last 3 Encounters:  11/10/21 127/69  10/27/21 114/62  10/13/21 (!) 70/39   Wt Readings from Last 3 Encounters:  11/10/21 206 lb (93.4 kg)  10/27/21 208 lb (94.3 kg)  10/13/21 203 lb (92.1 kg)   BMI Readings from Last 1 Encounters:  11/10/21 27.94 kg/m    *Unable to obtain current vital signs,  weight, and BMI due to telephone visit type  Hearing/Vision  [name] [name] did not seem to have difficulty with hearing/understanding during the telephone conversation Reports that he has had a formal eye exam by an eye care professional within the past year Reports that he has not had a formal hearing evaluation within the past year *Unable to fully assess hearing and vision during telephone visit type  Cognitive Function:    12/10/2021    8:58 AM 09/16/2020   10:08 AM  6CIT Screen  What Year? 0 points 0 points  What month? 0 points 0 points  What time? 0 points 0 points  Count back from 20 0 points 0 points  Months in reverse 2 points 0 points  Repeat phrase 2 points 0 points  Total Score 4 points 0 points   (Normal:0-7, Significant for Dysfunction: >8)  Normal Cognitive Function Screening: Yes   Immunization & Health Maintenance Record Immunization History  Administered Date(s)  Administered   DTaP 04/04/2016, 06/03/2016   DTaP / HiB / IPV 04/12/2017   Fluad Quad(high Dose 65+) 10/08/2018, 11/21/2019, 12/01/2021   HIB (PRP-T) 04/04/2016, 06/03/2016   Hepatitis B, adult 04/04/2016, 06/03/2016, 04/12/2017   IPV 04/04/2016, 06/03/2016   Influenza Inj Mdck Quad Pf 01/06/2012   Influenza, High Dose Seasonal PF 12/07/2016, 11/14/2017   Influenza, Seasonal, Injecte, Preservative Fre 12/27/2013, 12/01/2014   Influenza,inj,Quad PF,6+ Mos 12/27/2013, 12/01/2014, 12/02/2015   Influenza-Unspecified 01/06/2012, 12/01/2014   PFIZER(Purple Top)SARS-COV-2 Vaccination 02/25/2019, 03/17/2019, 10/12/2019   Pneumococcal Conjugate-13 10/07/2015, 04/04/2016, 06/03/2016   Pneumococcal Polysaccharide-23 12/27/2013, 04/12/2017    Health Maintenance  Topic Date Due   Diabetic kidney evaluation - Urine ACR  12/11/2021 (Originally 10/08/2019)   FOOT EXAM  12/11/2021 (Originally 10/08/2019)   COVID-19 Vaccine (4 - Pfizer risk series) 12/26/2021 (Originally 12/07/2019)   HEMOGLOBIN A1C  01/10/2022 (Originally 04/13/2021)   Zoster Vaccines- Shingrix (1 of 2) 03/12/2022 (Originally 11/22/1959)   OPHTHALMOLOGY EXAM  06/14/2022   Diabetic kidney evaluation - GFR measurement  08/14/2022   Medicare Annual Wellness (AWV)  12/11/2022   TETANUS/TDAP  05/16/2026   Pneumonia Vaccine 31+ Years old  Completed   INFLUENZA VACCINE  Completed   HPV VACCINES  Aged Out       Assessment  This is a routine wellness examination for [facility].  Health Maintenance: Due or Overdue There are no preventive care reminders to display for this patient.   [name] [name] does not need a referral for Community Assistance: Care Management:   no Social Work:    no Prescription Assistance:  no Nutrition/Diabetes Education:  no   Plan:  Personalized Goals  Goals Addressed               This Visit's Progress     Patient Stated (pt-stated)        12/10/2021 AWV Goal: Exercise for General  Health  Patient will verbalize understanding of the benefits of increased physical activity: Exercising regularly is important. It will improve your overall fitness, flexibility, and endurance. Regular exercise also will improve your overall health. It can help you control your weight, reduce stress, and improve your bone density. Over the next year, patient will increase physical activity as tolerated with a goal of at least 150 minutes of moderate physical activity per week.  You can tell that you are exercising at a moderate intensity if your heart starts beating faster and you start breathing faster but can still hold a conversation. Moderate-intensity exercise ideas include: Walking 1 mile (1.6  km) in about 15 minutes [place] [employer] Patient will verbalize understanding of everyday activities that increase physical activity by providing examples like the following: Yard work, such as: Sales promotion account executive Gardening Washing windows or floors Patient will be able to explain general safety guidelines for exercising:  Before you start a new exercise program, talk with your health care provider. Do not exercise so much that you hurt yourself, feel dizzy, or get very short of breath. Wear comfortable clothes and wear shoes with good support. Drink plenty of water while you exercise to prevent dehydration or heat stroke. Work out until your breathing and your heartbeat get faster.        Personalized Health Maintenance & Screening Recommendations  Shingles vaccine  Lung Cancer Screening Recommended: no (Low Dose CT Chest recommended if Age 53-80 years, 30 pack-year currently smoking OR have quit w/in past 15 years) Hepatitis C Screening recommended: no HIV Screening recommended: no  Advanced Directives: Written information was not prepared per patient's  request.  Referrals & Orders No orders of the defined types were placed in this encounter.   Follow-up Plan Follow-up with Silverio Decamp, MD as planned Schedule your shingles vaccine at the pharmacy.  Medicare wellness visit in one year.  Patient will access AVS on my chart.   I have personally reviewed and noted the following in the patient's chart:   Medical and social history Use of alcohol, tobacco or illicit drugs  Current medications and supplements Functional ability and status Nutritional status Physical activity Advanced directives List of other physicians Hospitalizations, surgeries, and ER visits in previous 12 months Vitals Screenings to include cognitive, depression, and falls Referrals and appointments  In addition, I have reviewed and discussed with [name] [name] certain preventive protocols, quality metrics, and best practice recommendations. A written personalized care plan for preventive services as well as general preventive health recommendations is available and can be mailed to the patient at his request.      Tinnie Gens, RN BSN  12/10/2021

## 2021-12-10 NOTE — Patient Instructions (Addendum)
[facility] Maintenance Summary and Written Plan of Care  [name] [name] ,  Thank you for allowing me to perform your Medicare Annual Wellness Visit and for your ongoing commitment to your health.   Health Maintenance & Immunization History Health Maintenance  Topic Date Due   OPHTHALMOLOGY EXAM  12/10/2021 (Originally 08/21/2021)   Diabetic kidney evaluation - Urine ACR  12/11/2021 (Originally 10/08/2019)   FOOT EXAM  12/11/2021 (Originally 10/08/2019)   COVID-19 Vaccine (4 - Pfizer risk series) 12/26/2021 (Originally 12/07/2019)   HEMOGLOBIN A1C  01/10/2022 (Originally 04/13/2021)   Zoster Vaccines- Shingrix (1 of 2) 03/12/2022 (Originally 11/22/1959)   Diabetic kidney evaluation - GFR measurement  08/14/2022   Medicare Annual Wellness (AWV)  12/11/2022   TETANUS/TDAP  05/16/2026   Pneumonia Vaccine 53+ Years old  Completed   INFLUENZA VACCINE  Completed   HPV VACCINES  Aged Out   Immunization History  Administered Date(s) Administered   DTaP 04/04/2016, 06/03/2016   DTaP / HiB / IPV 04/12/2017   Fluad Quad(high Dose 65+) 10/08/2018, 11/21/2019, 12/01/2021   HIB (PRP-T) 04/04/2016, 06/03/2016   Hepatitis B, adult 04/04/2016, 06/03/2016, 04/12/2017   IPV 04/04/2016, 06/03/2016   Influenza Inj Mdck Quad Pf 01/06/2012   Influenza, High Dose Seasonal PF 12/07/2016, 11/14/2017   Influenza, Seasonal, Injecte, Preservative Fre 12/27/2013, 12/01/2014   Influenza,inj,Quad PF,6+ Mos 12/27/2013, 12/01/2014, 12/02/2015   Influenza-Unspecified 01/06/2012, 12/01/2014   PFIZER(Purple Top)SARS-COV-2 Vaccination 02/25/2019, 03/17/2019, 10/12/2019   Pneumococcal Conjugate-13 10/07/2015, 04/04/2016, 06/03/2016   Pneumococcal Polysaccharide-23 12/27/2013, 04/12/2017    These are the patient goals that we discussed:  Goals Addressed               This Visit's Progress     Patient Stated (pt-stated)        12/10/2021 AWV Goal: Exercise for General Health  Patient  will verbalize understanding of the benefits of increased physical activity: Exercising regularly is important. It will improve your overall fitness, flexibility, and endurance. Regular exercise also will improve your overall health. It can help you control your weight, reduce stress, and improve your bone density. Over the next year, patient will increase physical activity as tolerated with a goal of at least 150 minutes of moderate physical activity per week.  You can tell that you are exercising at a moderate intensity if your heart starts beating faster and you start breathing faster but can still hold a conversation. Moderate-intensity exercise ideas include: Walking 1 mile (1.6 km) in about 15 minutes Biking Hiking Golfing Dancing Water aerobics Patient will verbalize understanding of everyday activities that increase physical activity by providing examples like the following: Yard work, such as: Sales promotion account executive Gardening Washing windows or floors Patient will be able to explain general safety guidelines for exercising:  Before you start a new exercise program, talk with your health care provider. Do not exercise so much that you hurt yourself, feel dizzy, or get very short of breath. Wear comfortable clothes and wear shoes with good support. Drink plenty of water while you exercise to prevent dehydration or heat stroke. Work out until your breathing and your heartbeat get faster.          This is a list of Health Maintenance Items that are overdue or due now: Shingles vaccine  Orders/Referrals Placed Today: No orders of the defined types were placed in this encounter.  (Contact our referral department at [phone] if  you have not spoken with someone about your referral appointment within the next 5 days)    Follow-up Plan Follow-up with Silverio Decamp, MD as planned Schedule  your shingles vaccine at the pharmacy.  Medicare wellness visit in one year.  Patient will access AVS on my chart.      Health Maintenance, Male Adopting a healthy lifestyle and getting preventive care are important in promoting health and wellness. Ask your health care provider about: The right schedule for you to have regular tests and exams. Things you can do on your own to prevent diseases and keep yourself healthy. What should I know about diet, weight, and exercise? Eat a healthy diet  Eat a diet that includes plenty of vegetables, fruits, low-fat dairy products, and lean protein. Do not eat a lot of foods that are high in solid fats, added sugars, or sodium. Maintain a healthy weight Body mass index (BMI) is a measurement that can be used to identify possible weight problems. It estimates body fat based on height and weight. Your health care provider can help determine your BMI and help you achieve or maintain a healthy weight. Get regular exercise Get regular exercise. This is one of the most important things you can do for your health. Most adults should: Exercise for at least 150 minutes each week. The exercise should increase your heart rate and make you sweat (moderate-intensity exercise). Do strengthening exercises at least twice a week. This is in addition to the moderate-intensity exercise. Spend less time sitting. Even light physical activity can be beneficial. Watch cholesterol and blood lipids Have your blood tested for lipids and cholesterol at 81 years of age, then have this test every 5 years. You may need to have your cholesterol levels checked more often if: Your lipid or cholesterol levels are high. You are older than 81 years of age. You are at high risk for heart disease. What should I know about cancer screening? Many types of cancers can be detected early and may often be prevented. Depending on your health history and family history, you may need to have  cancer screening at various ages. This may include screening for: Colorectal cancer. Prostate cancer. Skin cancer. Lung cancer. What should I know about heart disease, diabetes, and high blood pressure? Blood pressure and heart disease High blood pressure causes heart disease and increases the risk of stroke. This is more likely to develop in people who have high blood pressure readings or are overweight. Talk with your health care provider about your target blood pressure readings. Have your blood pressure checked: Every 3-5 years if you are 53-41 years of age. Every year if you are 81 years old or older. If you are between the ages of 5 and 72 and are a current or former smoker, ask your health care provider if you should have a one-time screening for abdominal aortic aneurysm (AAA). Diabetes Have regular diabetes screenings. This checks your fasting blood sugar level. Have the screening done: Once every three years after age 48 if you are at a normal weight and have a low risk for diabetes. More often and at a younger age if you are overweight or have a high risk for diabetes. What should I know about preventing infection? Hepatitis B If you have a higher risk for hepatitis B, you should be screened for this virus. Talk with your health care provider to find out if you are at risk for hepatitis B infection. Hepatitis C Blood testing  is recommended for: Everyone born from [birth year] through 1965. Anyone with known risk factors for hepatitis C. Sexually transmitted infections (STIs) You should be screened each year for STIs, including gonorrhea and chlamydia, if: You are sexually active and are younger than 81 years of age. You are older than 81 years of age and your health care provider tells you that you are at risk for this type of infection. Your sexual activity has changed since you were last screened, and you are at increased risk for chlamydia or gonorrhea. Ask your health care  provider if you are at risk. Ask your health care provider about whether you are at high risk for HIV. Your health care provider may recommend a prescription medicine to help prevent HIV infection. If you choose to take medicine to prevent HIV, you should first get tested for HIV. You should then be tested every 3 months for as long as you are taking the medicine. Follow these instructions at home: Alcohol use Do not drink alcohol if your health care provider tells you not to drink. If you drink alcohol: Limit how much you have to 0-2 drinks a day. Know how much alcohol is in your drink. In the [place] one drink equals one 12 oz bottle of beer (355 mL), one 5 oz glass of wine (148 mL), or one 1 oz glass of hard liquor (44 mL). Lifestyle Do not use any products that contain nicotine or tobacco. These products include cigarettes, chewing tobacco, and vaping devices, such as e-cigarettes. If you need help quitting, ask your health care provider. Do not use street drugs. Do not share needles. Ask your health care provider for help if you need support or information about quitting drugs. General instructions Schedule regular health, dental, and eye exams. Stay current with your vaccines. Tell your health care provider if: You often feel depressed. You have ever been abused or do not feel safe at home. Summary Adopting a healthy lifestyle and getting preventive care are important in promoting health and wellness. Follow your health care provider's instructions about healthy diet, exercising, and getting tested or screened for diseases. Follow your health care provider's instructions on monitoring your cholesterol and blood pressure. This information is not intended to replace advice given to you by your health care provider. Make sure you discuss any questions you have with your health care provider. Document Revised: 06/22/2020 Document Reviewed: 06/22/2020 Elsevier Patient Education  [place].

## 2021-12-14 DIAGNOSIS — C9002 Multiple myeloma in relapse: Secondary | ICD-10-CM | POA: Diagnosis not present

## 2021-12-14 DIAGNOSIS — Z5112 Encounter for antineoplastic immunotherapy: Secondary | ICD-10-CM | POA: Diagnosis not present

## 2021-12-21 DIAGNOSIS — I2510 Atherosclerotic heart disease of native coronary artery without angina pectoris: Secondary | ICD-10-CM | POA: Diagnosis not present

## 2021-12-21 DIAGNOSIS — Z9861 Coronary angioplasty status: Secondary | ICD-10-CM | POA: Diagnosis not present

## 2021-12-21 DIAGNOSIS — T451X5A Adverse effect of antineoplastic and immunosuppressive drugs, initial encounter: Secondary | ICD-10-CM | POA: Diagnosis not present

## 2021-12-21 DIAGNOSIS — Z9989 Dependence on other enabling machines and devices: Secondary | ICD-10-CM | POA: Diagnosis not present

## 2021-12-21 DIAGNOSIS — G4733 Obstructive sleep apnea (adult) (pediatric): Secondary | ICD-10-CM | POA: Diagnosis not present

## 2021-12-21 DIAGNOSIS — I110 Hypertensive heart disease with heart failure: Secondary | ICD-10-CM | POA: Diagnosis not present

## 2021-12-21 DIAGNOSIS — J0191 Acute recurrent sinusitis, unspecified: Secondary | ICD-10-CM | POA: Diagnosis not present

## 2021-12-21 DIAGNOSIS — Z7901 Long term (current) use of anticoagulants: Secondary | ICD-10-CM | POA: Diagnosis not present

## 2021-12-21 DIAGNOSIS — Z79899 Other long term (current) drug therapy: Secondary | ICD-10-CM | POA: Diagnosis not present

## 2021-12-21 DIAGNOSIS — J439 Emphysema, unspecified: Secondary | ICD-10-CM | POA: Diagnosis not present

## 2021-12-21 DIAGNOSIS — Z5112 Encounter for antineoplastic immunotherapy: Secondary | ICD-10-CM | POA: Diagnosis not present

## 2021-12-21 DIAGNOSIS — R351 Nocturia: Secondary | ICD-10-CM | POA: Diagnosis not present

## 2021-12-21 DIAGNOSIS — M6281 Muscle weakness (generalized): Secondary | ICD-10-CM | POA: Diagnosis not present

## 2021-12-21 DIAGNOSIS — Z86718 Personal history of other venous thrombosis and embolism: Secondary | ICD-10-CM | POA: Diagnosis not present

## 2021-12-21 DIAGNOSIS — Z7984 Long term (current) use of oral hypoglycemic drugs: Secondary | ICD-10-CM | POA: Diagnosis not present

## 2021-12-21 DIAGNOSIS — Z87891 Personal history of nicotine dependence: Secondary | ICD-10-CM | POA: Diagnosis not present

## 2021-12-21 DIAGNOSIS — I427 Cardiomyopathy due to drug and external agent: Secondary | ICD-10-CM | POA: Diagnosis not present

## 2021-12-21 DIAGNOSIS — Z9484 Stem cells transplant status: Secondary | ICD-10-CM | POA: Diagnosis not present

## 2021-12-21 DIAGNOSIS — I509 Heart failure, unspecified: Secondary | ICD-10-CM | POA: Diagnosis not present

## 2021-12-21 DIAGNOSIS — E785 Hyperlipidemia, unspecified: Secondary | ICD-10-CM | POA: Diagnosis not present

## 2021-12-21 DIAGNOSIS — M899 Disorder of bone, unspecified: Secondary | ICD-10-CM | POA: Diagnosis not present

## 2021-12-21 DIAGNOSIS — C9002 Multiple myeloma in relapse: Secondary | ICD-10-CM | POA: Diagnosis not present

## 2021-12-21 DIAGNOSIS — R051 Acute cough: Secondary | ICD-10-CM | POA: Diagnosis not present

## 2021-12-26 ENCOUNTER — Other Ambulatory Visit: Payer: Self-pay | Admitting: Sports Medicine

## 2021-12-26 DIAGNOSIS — M47815 Spondylosis without myelopathy or radiculopathy, thoracolumbar region: Secondary | ICD-10-CM

## 2021-12-28 DIAGNOSIS — M899 Disorder of bone, unspecified: Secondary | ICD-10-CM | POA: Diagnosis not present

## 2021-12-28 DIAGNOSIS — Z9484 Stem cells transplant status: Secondary | ICD-10-CM | POA: Diagnosis not present

## 2021-12-28 DIAGNOSIS — Z5111 Encounter for antineoplastic chemotherapy: Secondary | ICD-10-CM | POA: Diagnosis not present

## 2021-12-28 DIAGNOSIS — J0191 Acute recurrent sinusitis, unspecified: Secondary | ICD-10-CM | POA: Diagnosis not present

## 2021-12-28 DIAGNOSIS — R051 Acute cough: Secondary | ICD-10-CM | POA: Diagnosis not present

## 2021-12-28 DIAGNOSIS — C9002 Multiple myeloma in relapse: Secondary | ICD-10-CM | POA: Diagnosis not present

## 2021-12-28 MED ORDER — XTAMPZA ER 9 MG PO C12A: 1.0000 | EXTENDED_RELEASE_CAPSULE | Freq: Two times a day (BID) | ORAL | 0 refills | Status: DC

## 2022-01-04 DIAGNOSIS — Z5112 Encounter for antineoplastic immunotherapy: Secondary | ICD-10-CM | POA: Diagnosis not present

## 2022-01-04 DIAGNOSIS — C9002 Multiple myeloma in relapse: Secondary | ICD-10-CM | POA: Diagnosis not present

## 2022-01-11 DIAGNOSIS — C9002 Multiple myeloma in relapse: Secondary | ICD-10-CM | POA: Diagnosis not present

## 2022-01-13 ENCOUNTER — Telehealth: Payer: Self-pay | Admitting: Sports Medicine

## 2022-01-13 ENCOUNTER — Ambulatory Visit (INDEPENDENT_AMBULATORY_CARE_PROVIDER_SITE_OTHER): Payer: Medicare Other

## 2022-01-13 ENCOUNTER — Ambulatory Visit (INDEPENDENT_AMBULATORY_CARE_PROVIDER_SITE_OTHER): Payer: Medicare Other | Admitting: Sports Medicine

## 2022-01-13 DIAGNOSIS — M170 Bilateral primary osteoarthritis of knee: Secondary | ICD-10-CM

## 2022-01-13 DIAGNOSIS — I2510 Atherosclerotic heart disease of native coronary artery without angina pectoris: Secondary | ICD-10-CM | POA: Diagnosis not present

## 2022-01-13 DIAGNOSIS — Z9861 Coronary angioplasty status: Secondary | ICD-10-CM | POA: Diagnosis not present

## 2022-01-13 MED ORDER — TRIAMCINOLONE ACETONIDE 40 MG/ML IJ SUSP
80.0000 mg | Freq: Once | INTRAMUSCULAR | Status: AC
  Administered 2022-01-13: 80 mg via INTRAMUSCULAR

## 2022-01-13 NOTE — Telephone Encounter (Signed)
[employer] paperwork faxed to [employer] at [phone] Request is for Orthovisc Pt's insurance prefers Orthovisc Fax confirmation receipt received  

## 2022-01-13 NOTE — Telephone Encounter (Signed)
Orthovisc approval please, bilateral, x-ray confirmed, failed conservative treatment and steroid injections.

## 2022-01-13 NOTE — Progress Notes (Signed)
    Procedures performed today:    Procedure: Real-time Ultrasound Guided injection of the left knee Device: Samsung HS60  Verbal informed consent obtained.  Time-out conducted.  Noted no overlying erythema, induration, or other signs of local infection.  Skin prepped in a sterile fashion.  Local anesthesia: Topical Ethyl chloride.  With sterile technique and under real time ultrasound guidance: Mild effusion noted, 1 cc Kenalog 40, 2 cc lidocaine, 2 cc bupivacaine injected easily Completed without difficulty  Advised to call if fevers/chills, erythema, induration, drainage, or persistent bleeding.  Images permanently stored and available for review in PACS.  Impression: Technically successful ultrasound guided injection.   Procedure: Real-time Ultrasound Guided injection of the right knee Device: Samsung HS60  Verbal informed consent obtained.  Time-out conducted.  Noted no overlying erythema, induration, or other signs of local infection.  Skin prepped in a sterile fashion.  Local anesthesia: Topical Ethyl chloride.  With sterile technique and under real time ultrasound guidance: Mild effusion noted, 1 cc Kenalog 40, 2 cc lidocaine, 2 cc bupivacaine injected easily Completed without difficulty  Advised to call if fevers/chills, erythema, induration, drainage, or persistent bleeding.  Images permanently stored and available for review in PACS.  Impression: Technically successful ultrasound guided injection.  Independent interpretation of notes and tests performed by another provider:   None.  Brief History, Exam, Impression, and Recommendations:    Primary osteoarthritis of both knees This is a very pleasant 81 year old male, he has known bilateral knee osteoarthritis, last treated 2019, steroid injections, Orthovisc, he did pretty well, we were considering Coolief RFA but never needed. Unfortunately having recurrence of pain, bilateral, today we did bilateral knee injections  as oral analgesics and topical agents are ineffective, I would also like to get him approved for viscosupplementation. He does have x-ray confirmed osteoarthritis. For insurance coverage purposes he has failed greater than 6 weeks of physician directed conservative treatment.    ____________________________________________ Gwen Her. Dianah Field, M.D., ABFM., CAQSM., AME. Primary Care and Sports Medicine [facility]  Adjunct Professor of [place] of [facility] [employer] of [employer]  Risk manager

## 2022-01-13 NOTE — Assessment & Plan Note (Signed)
This is a very pleasant 81 year old male, he has known bilateral knee osteoarthritis, last treated 2019, steroid injections, Orthovisc, he did pretty well, we were considering Coolief RFA but never needed. Unfortunately having recurrence of pain, bilateral, today we did bilateral knee injections as oral analgesics and topical agents are ineffective, I would also like to get him approved for viscosupplementation. He does have x-ray confirmed osteoarthritis. For insurance coverage purposes he has failed greater than 6 weeks of physician directed conservative treatment.

## 2022-01-14 NOTE — Telephone Encounter (Signed)
Benefits Investigation Details received from [employer] Injection: Orthovisc  Medical: PRIMARY: Deductible applies. Since the deductible has been met, pt is responsible for a coinsurance. SECONDARY: Plan follows [employer] guidlelines. It will pick up remaining expenses at 100%.  PA required: No  Pharmacy: Product not covered under pharmacy plan.   Specialty Pharmacy:   May fill through: Buy and Bill OV Copay/Coinsurance: 20% ($24) Product Copay: 20% ($280) Administration Coinsurance: 20% ($26) Administration Copay:  Deductible: $226 (met: $226)  Secondary insurance picks up at 100%.   Pt aware of the insurance coverage and wishes to proceed with the injections. Please call and schedule pt for once weekly bilateral knee injections with Dr.T

## 2022-01-17 DIAGNOSIS — R059 Cough, unspecified: Secondary | ICD-10-CM | POA: Diagnosis not present

## 2022-01-17 DIAGNOSIS — R079 Chest pain, unspecified: Secondary | ICD-10-CM | POA: Diagnosis not present

## 2022-01-17 DIAGNOSIS — I4891 Unspecified atrial fibrillation: Secondary | ICD-10-CM | POA: Diagnosis not present

## 2022-01-17 DIAGNOSIS — I214 Non-ST elevation (NSTEMI) myocardial infarction: Secondary | ICD-10-CM | POA: Diagnosis not present

## 2022-01-17 DIAGNOSIS — R0789 Other chest pain: Secondary | ICD-10-CM | POA: Diagnosis not present

## 2022-01-17 DIAGNOSIS — N400 Enlarged prostate without lower urinary tract symptoms: Secondary | ICD-10-CM | POA: Diagnosis not present

## 2022-01-17 DIAGNOSIS — R519 Headache, unspecified: Secondary | ICD-10-CM | POA: Diagnosis not present

## 2022-01-17 DIAGNOSIS — I4892 Unspecified atrial flutter: Secondary | ICD-10-CM | POA: Diagnosis not present

## 2022-01-17 DIAGNOSIS — R918 Other nonspecific abnormal finding of lung field: Secondary | ICD-10-CM | POA: Diagnosis not present

## 2022-01-17 DIAGNOSIS — R0602 Shortness of breath: Secondary | ICD-10-CM | POA: Diagnosis not present

## 2022-01-17 DIAGNOSIS — R002 Palpitations: Secondary | ICD-10-CM | POA: Diagnosis not present

## 2022-01-17 DIAGNOSIS — R42 Dizziness and giddiness: Secondary | ICD-10-CM | POA: Diagnosis not present

## 2022-01-17 DIAGNOSIS — I200 Unstable angina: Secondary | ICD-10-CM | POA: Diagnosis not present

## 2022-01-17 DIAGNOSIS — J438 Other emphysema: Secondary | ICD-10-CM | POA: Diagnosis not present

## 2022-01-17 DIAGNOSIS — I10 Essential (primary) hypertension: Secondary | ICD-10-CM | POA: Diagnosis not present

## 2022-01-17 NOTE — Telephone Encounter (Signed)
Pt is in the hospital and will call us back to schedule his Orthovisc injections. Tvt

## 2022-01-18 DIAGNOSIS — E785 Hyperlipidemia, unspecified: Secondary | ICD-10-CM | POA: Diagnosis not present

## 2022-01-18 DIAGNOSIS — I10 Essential (primary) hypertension: Secondary | ICD-10-CM | POA: Diagnosis not present

## 2022-01-18 DIAGNOSIS — I4891 Unspecified atrial fibrillation: Secondary | ICD-10-CM | POA: Diagnosis not present

## 2022-01-18 DIAGNOSIS — R079 Chest pain, unspecified: Secondary | ICD-10-CM | POA: Diagnosis not present

## 2022-01-18 DIAGNOSIS — K219 Gastro-esophageal reflux disease without esophagitis: Secondary | ICD-10-CM | POA: Diagnosis not present

## 2022-01-18 DIAGNOSIS — J45909 Unspecified asthma, uncomplicated: Secondary | ICD-10-CM | POA: Diagnosis not present

## 2022-01-18 DIAGNOSIS — G4730 Sleep apnea, unspecified: Secondary | ICD-10-CM | POA: Diagnosis not present

## 2022-01-18 DIAGNOSIS — I200 Unstable angina: Secondary | ICD-10-CM | POA: Diagnosis not present

## 2022-01-18 DIAGNOSIS — E119 Type 2 diabetes mellitus without complications: Secondary | ICD-10-CM | POA: Diagnosis not present

## 2022-01-18 DIAGNOSIS — J449 Chronic obstructive pulmonary disease, unspecified: Secondary | ICD-10-CM | POA: Diagnosis not present

## 2022-01-18 DIAGNOSIS — I080 Rheumatic disorders of both mitral and aortic valves: Secondary | ICD-10-CM | POA: Diagnosis not present

## 2022-01-18 DIAGNOSIS — E039 Hypothyroidism, unspecified: Secondary | ICD-10-CM | POA: Diagnosis not present

## 2022-01-18 DIAGNOSIS — I4892 Unspecified atrial flutter: Secondary | ICD-10-CM | POA: Diagnosis not present

## 2022-01-19 DIAGNOSIS — I4891 Unspecified atrial fibrillation: Secondary | ICD-10-CM | POA: Diagnosis not present

## 2022-01-20 NOTE — Telephone Encounter (Signed)
[name] [name] called back and left a message stating he is ready to schedule his knee injections. Please call patient to schedule.

## 2022-01-21 ENCOUNTER — Encounter: Payer: Self-pay | Admitting: *Deleted

## 2022-01-21 ENCOUNTER — Telehealth: Payer: Self-pay | Admitting: *Deleted

## 2022-01-21 NOTE — Patient Outreach (Signed)
  Care Coordination TOC Note Transition Care Management Follow-up Telephone Call Date of discharge and from where: Wednesday, 01/19/22, [facility]; chest pain/ shortness of breath; A-Fib with RVR How have you been since you were released from the hospital? "I am doing okay, feeling much better after they did the treatment that shocked my heart back into rhythm.  I am taking the new medication and not having any problems.  My wife is helping as needed, but I am able to do everything on my own for the most part" Any questions or concerns? Yes  Items Reviewed: Did the pt receive and understand the discharge instructions provided? Yes  Medications obtained and verified? Yes  Other? No  Any new allergies since your discharge? No  Dietary orders reviewed? Yes Do you have support at home? Yes  spouse assists with care needs as needed/ indicated; patient reports he is essentially independent in all self-care  Home Care and Equipment/Supplies: Were home health services ordered? no If so, what is the name of the agency? N/A  Has the agency set up a time to come to the patient's home? not applicable Were any new equipment or medical supplies ordered?  No What is the name of the medical supply agency? N/A Were you able to get the supplies/equipment? not applicable Do you have any questions related to the use of the equipment or supplies? No  Functional Questionnaire: (I = Independent and D = Dependent) ADLs: I  spouse assists with care needs as needed/ indicated  Bathing/Dressing- I  Meal Prep- I  spouse assists with care needs as needed/ indicated  Eating- I  Maintaining continence- I  Transferring/Ambulation- I  Managing Meds- I  spouse assists with care needs as needed/ indicated  Follow up appointments reviewed:  PCP Hospital f/u appt confirmed? Yes  Scheduled to see PCP on Thursday, 02/03/22 @ 10:30 am. [facility] f/u appt confirmed? No  Scheduled to see - on - @ - Are  transportation arrangements needed? No  If their condition worsens, is the pt aware to call PCP or go to the Emergency Dept.? Yes Was the patient provided with contact information for the PCP's office or ED? No Was to pt encouraged to call back with questions or concerns? Yes  SDOH assessments and interventions completed:   Yes SDOH Interventions Today    Flowsheet Row Most Recent Value  SDOH Interventions   Food Insecurity Interventions Intervention Not Indicated  Transportation Interventions Intervention Not Indicated      Care Coordination Interventions:  Provided clarification of medication changes as recommended at time of hospital discharge from outside hospital; added new medications to EHR     Encounter Outcome:  Pt. Visit Completed    Oneta Rack, RN, BSN, CCRN Alumnus RN CM Care Coordination/ Transition of [place] Management [phone]: direct office

## 2022-01-24 DIAGNOSIS — Z7901 Long term (current) use of anticoagulants: Secondary | ICD-10-CM | POA: Diagnosis not present

## 2022-01-24 DIAGNOSIS — R918 Other nonspecific abnormal finding of lung field: Secondary | ICD-10-CM | POA: Diagnosis not present

## 2022-01-24 DIAGNOSIS — R079 Chest pain, unspecified: Secondary | ICD-10-CM | POA: Diagnosis not present

## 2022-01-24 DIAGNOSIS — I2510 Atherosclerotic heart disease of native coronary artery without angina pectoris: Secondary | ICD-10-CM | POA: Diagnosis not present

## 2022-01-24 DIAGNOSIS — J438 Other emphysema: Secondary | ICD-10-CM | POA: Diagnosis not present

## 2022-01-24 DIAGNOSIS — I255 Ischemic cardiomyopathy: Secondary | ICD-10-CM | POA: Diagnosis not present

## 2022-01-24 DIAGNOSIS — G4733 Obstructive sleep apnea (adult) (pediatric): Secondary | ICD-10-CM | POA: Diagnosis not present

## 2022-01-24 DIAGNOSIS — Z9481 Bone marrow transplant status: Secondary | ICD-10-CM | POA: Diagnosis not present

## 2022-01-24 DIAGNOSIS — C9000 Multiple myeloma not having achieved remission: Secondary | ICD-10-CM | POA: Diagnosis not present

## 2022-01-24 DIAGNOSIS — I4891 Unspecified atrial fibrillation: Secondary | ICD-10-CM | POA: Diagnosis not present

## 2022-01-24 DIAGNOSIS — Z86711 Personal history of pulmonary embolism: Secondary | ICD-10-CM | POA: Diagnosis not present

## 2022-01-25 DIAGNOSIS — I427 Cardiomyopathy due to drug and external agent: Secondary | ICD-10-CM | POA: Diagnosis not present

## 2022-01-25 DIAGNOSIS — I509 Heart failure, unspecified: Secondary | ICD-10-CM | POA: Diagnosis not present

## 2022-01-25 DIAGNOSIS — Z9484 Stem cells transplant status: Secondary | ICD-10-CM | POA: Diagnosis not present

## 2022-01-25 DIAGNOSIS — C9002 Multiple myeloma in relapse: Secondary | ICD-10-CM | POA: Diagnosis not present

## 2022-01-25 DIAGNOSIS — M899 Disorder of bone, unspecified: Secondary | ICD-10-CM | POA: Diagnosis not present

## 2022-01-25 DIAGNOSIS — T50995A Adverse effect of other drugs, medicaments and biological substances, initial encounter: Secondary | ICD-10-CM | POA: Diagnosis not present

## 2022-01-25 DIAGNOSIS — Z5112 Encounter for antineoplastic immunotherapy: Secondary | ICD-10-CM | POA: Diagnosis not present

## 2022-01-25 DIAGNOSIS — R351 Nocturia: Secondary | ICD-10-CM | POA: Diagnosis not present

## 2022-01-25 DIAGNOSIS — I110 Hypertensive heart disease with heart failure: Secondary | ICD-10-CM | POA: Diagnosis not present

## 2022-01-25 DIAGNOSIS — M531 Cervicobrachial syndrome: Secondary | ICD-10-CM | POA: Diagnosis not present

## 2022-01-25 DIAGNOSIS — G620 Drug-induced polyneuropathy: Secondary | ICD-10-CM | POA: Diagnosis not present

## 2022-01-27 ENCOUNTER — Encounter: Payer: Self-pay | Admitting: Sports Medicine

## 2022-01-27 DIAGNOSIS — M47815 Spondylosis without myelopathy or radiculopathy, thoracolumbar region: Secondary | ICD-10-CM

## 2022-01-27 MED ORDER — OXYCODONE-ACETAMINOPHEN 10-325 MG PO TABS: ORAL_TABLET | ORAL | 0 refills | Status: DC

## 2022-01-27 MED ORDER — XTAMPZA ER 9 MG PO C12A: 1.0000 | EXTENDED_RELEASE_CAPSULE | Freq: Two times a day (BID) | ORAL | 0 refills | Status: DC

## 2022-01-27 NOTE — Telephone Encounter (Signed)
Last written   Oxycodone ER 9 mg #60 with no refills on 12/28/2021  Oxycodone - Acetaminophen 10-325 mg #60 with no refills on 10/27/2021

## 2022-01-30 DIAGNOSIS — Z888 Allergy status to other drugs, medicaments and biological substances status: Secondary | ICD-10-CM | POA: Diagnosis not present

## 2022-01-30 DIAGNOSIS — Z1152 Encounter for screening for COVID-19: Secondary | ICD-10-CM | POA: Diagnosis not present

## 2022-01-30 DIAGNOSIS — M79604 Pain in right leg: Secondary | ICD-10-CM | POA: Diagnosis not present

## 2022-01-30 DIAGNOSIS — Z7901 Long term (current) use of anticoagulants: Secondary | ICD-10-CM | POA: Diagnosis not present

## 2022-01-30 DIAGNOSIS — I4510 Unspecified right bundle-branch block: Secondary | ICD-10-CM | POA: Diagnosis not present

## 2022-01-30 DIAGNOSIS — G4733 Obstructive sleep apnea (adult) (pediatric): Secondary | ICD-10-CM | POA: Diagnosis not present

## 2022-01-30 DIAGNOSIS — Z7984 Long term (current) use of oral hypoglycemic drugs: Secondary | ICD-10-CM | POA: Diagnosis not present

## 2022-01-30 DIAGNOSIS — R9431 Abnormal electrocardiogram [ECG] [EKG]: Secondary | ICD-10-CM | POA: Diagnosis not present

## 2022-01-30 DIAGNOSIS — R0602 Shortness of breath: Secondary | ICD-10-CM | POA: Diagnosis not present

## 2022-01-30 DIAGNOSIS — M7989 Other specified soft tissue disorders: Secondary | ICD-10-CM | POA: Diagnosis not present

## 2022-01-30 DIAGNOSIS — J9811 Atelectasis: Secondary | ICD-10-CM | POA: Diagnosis not present

## 2022-01-30 DIAGNOSIS — I10 Essential (primary) hypertension: Secondary | ICD-10-CM | POA: Diagnosis not present

## 2022-01-30 DIAGNOSIS — Z9481 Bone marrow transplant status: Secondary | ICD-10-CM | POA: Diagnosis not present

## 2022-01-30 DIAGNOSIS — Z86718 Personal history of other venous thrombosis and embolism: Secondary | ICD-10-CM | POA: Diagnosis not present

## 2022-01-30 DIAGNOSIS — L03115 Cellulitis of right lower limb: Secondary | ICD-10-CM | POA: Diagnosis not present

## 2022-01-30 DIAGNOSIS — I2510 Atherosclerotic heart disease of native coronary artery without angina pectoris: Secondary | ICD-10-CM | POA: Diagnosis not present

## 2022-01-30 DIAGNOSIS — Z87891 Personal history of nicotine dependence: Secondary | ICD-10-CM | POA: Diagnosis not present

## 2022-01-30 DIAGNOSIS — Z9989 Dependence on other enabling machines and devices: Secondary | ICD-10-CM | POA: Diagnosis not present

## 2022-01-30 DIAGNOSIS — M79661 Pain in right lower leg: Secondary | ICD-10-CM | POA: Diagnosis not present

## 2022-01-30 DIAGNOSIS — J90 Pleural effusion, not elsewhere classified: Secondary | ICD-10-CM | POA: Diagnosis not present

## 2022-01-30 DIAGNOSIS — Z885 Allergy status to narcotic agent status: Secondary | ICD-10-CM | POA: Diagnosis not present

## 2022-01-30 DIAGNOSIS — Z79899 Other long term (current) drug therapy: Secondary | ICD-10-CM | POA: Diagnosis not present

## 2022-01-30 DIAGNOSIS — J188 Other pneumonia, unspecified organism: Secondary | ICD-10-CM | POA: Diagnosis not present

## 2022-01-30 DIAGNOSIS — K219 Gastro-esophageal reflux disease without esophagitis: Secondary | ICD-10-CM | POA: Diagnosis not present

## 2022-01-30 DIAGNOSIS — R918 Other nonspecific abnormal finding of lung field: Secondary | ICD-10-CM | POA: Diagnosis not present

## 2022-01-30 DIAGNOSIS — J181 Lobar pneumonia, unspecified organism: Secondary | ICD-10-CM | POA: Diagnosis not present

## 2022-01-30 DIAGNOSIS — Z91018 Allergy to other foods: Secondary | ICD-10-CM | POA: Diagnosis not present

## 2022-01-30 DIAGNOSIS — Z79890 Hormone replacement therapy (postmenopausal): Secondary | ICD-10-CM | POA: Diagnosis not present

## 2022-01-30 DIAGNOSIS — Z8579 Personal history of other malignant neoplasms of lymphoid, hematopoietic and related tissues: Secondary | ICD-10-CM | POA: Diagnosis not present

## 2022-01-30 DIAGNOSIS — J449 Chronic obstructive pulmonary disease, unspecified: Secondary | ICD-10-CM | POA: Diagnosis not present

## 2022-02-02 ENCOUNTER — Encounter: Payer: Self-pay | Admitting: Sports Medicine

## 2022-02-03 ENCOUNTER — Ambulatory Visit: Payer: Medicare Other | Admitting: Sports Medicine

## 2022-02-08 DIAGNOSIS — M899 Disorder of bone, unspecified: Secondary | ICD-10-CM | POA: Diagnosis not present

## 2022-02-08 DIAGNOSIS — Z5112 Encounter for antineoplastic immunotherapy: Secondary | ICD-10-CM | POA: Diagnosis not present

## 2022-02-08 DIAGNOSIS — Z9484 Stem cells transplant status: Secondary | ICD-10-CM | POA: Diagnosis not present

## 2022-02-08 DIAGNOSIS — C9002 Multiple myeloma in relapse: Secondary | ICD-10-CM | POA: Diagnosis not present

## 2022-02-08 DIAGNOSIS — R600 Localized edema: Secondary | ICD-10-CM | POA: Diagnosis not present

## 2022-02-10 ENCOUNTER — Ambulatory Visit (INDEPENDENT_AMBULATORY_CARE_PROVIDER_SITE_OTHER): Payer: Medicare Other | Admitting: Sports Medicine

## 2022-02-10 ENCOUNTER — Ambulatory Visit (INDEPENDENT_AMBULATORY_CARE_PROVIDER_SITE_OTHER): Payer: Medicare Other

## 2022-02-10 DIAGNOSIS — I2510 Atherosclerotic heart disease of native coronary artery without angina pectoris: Secondary | ICD-10-CM | POA: Diagnosis not present

## 2022-02-10 DIAGNOSIS — M170 Bilateral primary osteoarthritis of knee: Secondary | ICD-10-CM

## 2022-02-10 DIAGNOSIS — N189 Chronic kidney disease, unspecified: Secondary | ICD-10-CM | POA: Diagnosis not present

## 2022-02-10 DIAGNOSIS — R221 Localized swelling, mass and lump, neck: Secondary | ICD-10-CM | POA: Insufficient documentation

## 2022-02-10 DIAGNOSIS — N183 Chronic kidney disease, stage 3 unspecified: Secondary | ICD-10-CM

## 2022-02-10 DIAGNOSIS — N179 Acute kidney failure, unspecified: Secondary | ICD-10-CM

## 2022-02-10 DIAGNOSIS — R220 Localized swelling, mass and lump, head: Secondary | ICD-10-CM | POA: Diagnosis not present

## 2022-02-10 DIAGNOSIS — N289 Disorder of kidney and ureter, unspecified: Secondary | ICD-10-CM

## 2022-02-10 DIAGNOSIS — Z9861 Coronary angioplasty status: Secondary | ICD-10-CM | POA: Diagnosis not present

## 2022-02-10 MED ORDER — HYALURONAN 30 MG/2ML IX SOSY
30.0000 mg | PREFILLED_SYRINGE | Freq: Once | INTRA_ARTICULAR | Status: AC
  Administered 2022-02-10: 30 mg via INTRA_ARTICULAR

## 2022-02-10 MED ORDER — FUROSEMIDE 20 MG PO TABS: ORAL_TABLET | ORAL | 11 refills | Status: DC

## 2022-02-10 NOTE — Addendum Note (Signed)
Addended by: Tarri Glenn A on: 02/10/2022 11:50 AM   Modules accepted: Orders

## 2022-02-10 NOTE — Assessment & Plan Note (Signed)
Continue current medications, he does have some increasing swelling bilateral lower extremities, symmetric, negative DVT ultrasounds per patient report. No erythema though I do suspect this is related to venous stasis. I will refill his Lasix and he will elevate his extremities and wear bilateral compression stockings.

## 2022-02-10 NOTE — Assessment & Plan Note (Signed)
This pleasant 81-year-old male has noted a tender swelling occiput left-sided, we do need an ultrasound, he does have a history of multiple myeloma. 

## 2022-02-10 NOTE — Progress Notes (Signed)
    Procedures performed today:    Procedure: Real-time Ultrasound Guided injection of the left knee Device: Samsung HS60  Verbal informed consent obtained.  Time-out conducted.  Noted no overlying erythema, induration, or other signs of local infection.  Skin prepped in a sterile fashion.  Local anesthesia: Topical Ethyl chloride.  With sterile technique and under real time ultrasound guidance: Trace effusion noted, 30 mg/2 mL of OrthoVisc (sodium hyaluronate) in a prefilled syringe was injected easily into the knee through a 22-gauge needle. Completed without difficulty  Advised to call if fevers/chills, erythema, induration, drainage, or persistent bleeding.  Images permanently stored and available for review in PACS.  Impression: Technically successful ultrasound guided injection.  Procedure: Real-time Ultrasound Guided injection of the right knee Device: Samsung HS60  Verbal informed consent obtained.  Time-out conducted.  Noted no overlying erythema, induration, or other signs of local infection.  Skin prepped in a sterile fashion.  Local anesthesia: Topical Ethyl chloride.  With sterile technique and under real time ultrasound guidance: Trace effusion noted, 30 mg/2 mL of OrthoVisc (sodium hyaluronate) in a prefilled syringe was injected easily into the knee through a 22-gauge needle. Completed without difficulty  Advised to call if fevers/chills, erythema, induration, drainage, or persistent bleeding.  Images permanently stored and available for review in PACS.  Impression: Technically successful ultrasound guided injection.  Independent interpretation of notes and tests performed by another provider:   None.  Brief History, Exam, Impression, and Recommendations:    Primary osteoarthritis of both knees Pleasant 81 year old male, known bilateral knee osteoarthritis, he has had Orthovisc in the past and did really well. He did get reapproved for Orthovisc, we did a steroid  injection about a month ago, starting Orthovisc bilateral today. Return in 1 week for Orthovisc No. 2 of 4.  Swelling of head This pleasant 81 year old male has noted a tender swelling occiput left-sided, we do need an ultrasound, he does have a history of multiple myeloma.  Acute on chronic renal insufficiency Continue current medications, he does have some increasing swelling bilateral lower extremities, symmetric, negative DVT ultrasounds per patient report. No erythema though I do suspect this is related to venous stasis. I will refill his Lasix and he will elevate his extremities and wear bilateral compression stockings.    ____________________________________________ Gwen Her. Dianah Field, M.D., ABFM., CAQSM., AME. Primary Care and Sports Medicine [facility]  Adjunct Professor of [place] of [facility] of Medicine  Risk manager

## 2022-02-10 NOTE — Assessment & Plan Note (Signed)
Pleasant 81 year old male, known bilateral knee osteoarthritis, he has had Orthovisc in the past and did really well. He did get reapproved for Orthovisc, we did a steroid injection about a month ago, starting Orthovisc bilateral today. Return in 1 week for Orthovisc No. 2 of 4.

## 2022-02-11 DIAGNOSIS — C9000 Multiple myeloma not having achieved remission: Secondary | ICD-10-CM | POA: Diagnosis not present

## 2022-02-15 ENCOUNTER — Other Ambulatory Visit: Payer: Self-pay | Admitting: Sports Medicine

## 2022-02-15 DIAGNOSIS — Z79899 Other long term (current) drug therapy: Secondary | ICD-10-CM | POA: Diagnosis not present

## 2022-02-15 DIAGNOSIS — I480 Paroxysmal atrial fibrillation: Secondary | ICD-10-CM | POA: Diagnosis not present

## 2022-02-15 DIAGNOSIS — E782 Mixed hyperlipidemia: Secondary | ICD-10-CM | POA: Diagnosis not present

## 2022-02-15 DIAGNOSIS — C9002 Multiple myeloma in relapse: Secondary | ICD-10-CM | POA: Diagnosis not present

## 2022-02-16 ENCOUNTER — Ambulatory Visit (INDEPENDENT_AMBULATORY_CARE_PROVIDER_SITE_OTHER): Payer: Medicare Other

## 2022-02-16 DIAGNOSIS — R221 Localized swelling, mass and lump, neck: Secondary | ICD-10-CM | POA: Diagnosis not present

## 2022-02-16 DIAGNOSIS — R220 Localized swelling, mass and lump, head: Secondary | ICD-10-CM | POA: Diagnosis not present

## 2022-02-17 ENCOUNTER — Ambulatory Visit (INDEPENDENT_AMBULATORY_CARE_PROVIDER_SITE_OTHER): Payer: Medicare Other

## 2022-02-17 ENCOUNTER — Encounter: Payer: Self-pay | Admitting: Sports Medicine

## 2022-02-17 ENCOUNTER — Ambulatory Visit (INDEPENDENT_AMBULATORY_CARE_PROVIDER_SITE_OTHER): Payer: Medicare Other | Admitting: Sports Medicine

## 2022-02-17 VITALS — BP 92/64 | HR 130

## 2022-02-17 DIAGNOSIS — I482 Chronic atrial fibrillation, unspecified: Secondary | ICD-10-CM | POA: Diagnosis not present

## 2022-02-17 DIAGNOSIS — R221 Localized swelling, mass and lump, neck: Secondary | ICD-10-CM | POA: Diagnosis not present

## 2022-02-17 DIAGNOSIS — R0602 Shortness of breath: Secondary | ICD-10-CM | POA: Diagnosis not present

## 2022-02-17 DIAGNOSIS — Z7982 Long term (current) use of aspirin: Secondary | ICD-10-CM | POA: Diagnosis not present

## 2022-02-17 DIAGNOSIS — R0989 Other specified symptoms and signs involving the circulatory and respiratory systems: Secondary | ICD-10-CM | POA: Diagnosis not present

## 2022-02-17 DIAGNOSIS — E1122 Type 2 diabetes mellitus with diabetic chronic kidney disease: Secondary | ICD-10-CM | POA: Diagnosis not present

## 2022-02-17 DIAGNOSIS — M170 Bilateral primary osteoarthritis of knee: Secondary | ICD-10-CM

## 2022-02-17 DIAGNOSIS — E039 Hypothyroidism, unspecified: Secondary | ICD-10-CM | POA: Diagnosis not present

## 2022-02-17 DIAGNOSIS — Z7984 Long term (current) use of oral hypoglycemic drugs: Secondary | ICD-10-CM | POA: Diagnosis not present

## 2022-02-17 DIAGNOSIS — R079 Chest pain, unspecified: Secondary | ICD-10-CM | POA: Diagnosis not present

## 2022-02-17 DIAGNOSIS — E785 Hyperlipidemia, unspecified: Secondary | ICD-10-CM | POA: Diagnosis not present

## 2022-02-17 DIAGNOSIS — Z91018 Allergy to other foods: Secondary | ICD-10-CM | POA: Diagnosis not present

## 2022-02-17 DIAGNOSIS — I2510 Atherosclerotic heart disease of native coronary artery without angina pectoris: Secondary | ICD-10-CM | POA: Diagnosis not present

## 2022-02-17 DIAGNOSIS — I4510 Unspecified right bundle-branch block: Secondary | ICD-10-CM | POA: Diagnosis not present

## 2022-02-17 DIAGNOSIS — J90 Pleural effusion, not elsewhere classified: Secondary | ICD-10-CM | POA: Diagnosis not present

## 2022-02-17 DIAGNOSIS — J439 Emphysema, unspecified: Secondary | ICD-10-CM | POA: Diagnosis not present

## 2022-02-17 DIAGNOSIS — Z885 Allergy status to narcotic agent status: Secondary | ICD-10-CM | POA: Diagnosis not present

## 2022-02-17 DIAGNOSIS — R609 Edema, unspecified: Secondary | ICD-10-CM | POA: Diagnosis not present

## 2022-02-17 DIAGNOSIS — R918 Other nonspecific abnormal finding of lung field: Secondary | ICD-10-CM | POA: Diagnosis not present

## 2022-02-17 DIAGNOSIS — R5383 Other fatigue: Secondary | ICD-10-CM | POA: Diagnosis not present

## 2022-02-17 DIAGNOSIS — K219 Gastro-esophageal reflux disease without esophagitis: Secondary | ICD-10-CM | POA: Diagnosis not present

## 2022-02-17 DIAGNOSIS — N183 Chronic kidney disease, stage 3 unspecified: Secondary | ICD-10-CM | POA: Diagnosis not present

## 2022-02-17 DIAGNOSIS — I4892 Unspecified atrial flutter: Secondary | ICD-10-CM | POA: Insufficient documentation

## 2022-02-17 DIAGNOSIS — I1310 Hypertensive heart and chronic kidney disease without heart failure, with stage 1 through stage 4 chronic kidney disease, or unspecified chronic kidney disease: Secondary | ICD-10-CM | POA: Diagnosis not present

## 2022-02-17 DIAGNOSIS — Z87891 Personal history of nicotine dependence: Secondary | ICD-10-CM | POA: Diagnosis not present

## 2022-02-17 DIAGNOSIS — I484 Atypical atrial flutter: Secondary | ICD-10-CM

## 2022-02-17 DIAGNOSIS — Z8616 Personal history of COVID-19: Secondary | ICD-10-CM | POA: Diagnosis not present

## 2022-02-17 DIAGNOSIS — G4733 Obstructive sleep apnea (adult) (pediatric): Secondary | ICD-10-CM | POA: Diagnosis not present

## 2022-02-17 DIAGNOSIS — Z79899 Other long term (current) drug therapy: Secondary | ICD-10-CM | POA: Diagnosis not present

## 2022-02-17 DIAGNOSIS — Z86711 Personal history of pulmonary embolism: Secondary | ICD-10-CM | POA: Diagnosis not present

## 2022-02-17 DIAGNOSIS — N179 Acute kidney failure, unspecified: Secondary | ICD-10-CM | POA: Diagnosis not present

## 2022-02-17 DIAGNOSIS — I429 Cardiomyopathy, unspecified: Secondary | ICD-10-CM | POA: Diagnosis not present

## 2022-02-17 DIAGNOSIS — I4891 Unspecified atrial fibrillation: Secondary | ICD-10-CM | POA: Diagnosis not present

## 2022-02-17 DIAGNOSIS — Z86718 Personal history of other venous thrombosis and embolism: Secondary | ICD-10-CM | POA: Diagnosis not present

## 2022-02-17 MED ORDER — HYALURONAN 30 MG/2ML IX SOSY
30.0000 mg | PREFILLED_SYRINGE | Freq: Once | INTRA_ARTICULAR | Status: AC
  Administered 2022-02-17: 30 mg via INTRA_ARTICULAR

## 2022-02-17 NOTE — Addendum Note (Signed)
Addended by: Silverio Decamp on: 02/17/2022 11:14 AM   Modules accepted: Level of Service

## 2022-02-17 NOTE — Addendum Note (Signed)
Addended by: Tarri Glenn A on: 02/17/2022 10:33 AM   Modules accepted: Orders

## 2022-02-17 NOTE — Progress Notes (Addendum)
    Procedures performed today:    Procedure: Real-time Ultrasound Guided injection of the left knee Device: Samsung HS60  Verbal informed consent obtained.  Time-out conducted.  Noted no overlying erythema, induration, or other signs of local infection.  Skin prepped in a sterile fashion.  Local anesthesia: Topical Ethyl chloride.  With sterile technique and under real time ultrasound guidance: Trace effusion noted, 30 mg/2 mL of OrthoVisc (sodium hyaluronate) in a prefilled syringe was injected easily into the knee through a 22-gauge needle. Completed without difficulty  Advised to call if fevers/chills, erythema, induration, drainage, or persistent bleeding.  Images permanently stored and available for review in PACS.  Impression: Technically successful ultrasound guided injection.   Procedure: Real-time Ultrasound Guided injection of the right knee Device: Samsung HS60  Verbal informed consent obtained.  Time-out conducted.  Noted no overlying erythema, induration, or other signs of local infection.  Skin prepped in a sterile fashion.  Local anesthesia: Topical Ethyl chloride.  With sterile technique and under real time ultrasound guidance: Trace effusion noted, 30 mg/2 mL of OrthoVisc (sodium hyaluronate) in a prefilled syringe was injected easily into the knee through a 22-gauge needle. Completed without difficulty  Advised to call if fevers/chills, erythema, induration, drainage, or persistent bleeding.  Images permanently stored and available for review in PACS.  Impression: Technically successful ultrasound guided injection.  Twelve-lead ECG performed and interpreted by me today, he has atrial flutter with varying degrees of atrioventricular block with a rate between the 120s and 150s.  Independent interpretation of notes and tests performed by another provider:   None.  Brief History, Exam, Impression, and Recommendations:    Primary osteoarthritis of both  knees Orthovisc No. 2 of 4 both knees, return in 1 week for #3 of 4.  Atrial flutter ([place]) At the end of the visit [name] let us know that his heart rates have been fluctuating up as high as the 150s with dizziness, blood pressures did show heart rates in the 140s, twelve-lead ECG done at the end of the visit showed atrial flutter with varying degrees of atrial ventricular block with a rate of 126 bpm but as high as 150 bpm. He is currently on amiodarone and has had a cardioversion recently. Due to the symptoms I do think he needs to proceed to the ED, he will present to [place] where his ablation was done.  Mass in neck Tender left-sided occipital swelling/mass was not visible on ultrasound, proceeding with CT neck with contrast.  I spent 40 minutes of total time managing this patient today, this includes chart review, face to face, and non-face to face time.  This is based on time spent with the patient, as well as the decision to hospitalize, the above procedures were separate from the time spent for the level 5 office visit.  ____________________________________________ Gwen Her. Dianah Field, M.D., ABFM., CAQSM., AME. Primary Care and Sports Medicine [place] [facility]  Adjunct Professor of [place] [employer] of Medicine  Risk manager

## 2022-02-17 NOTE — Assessment & Plan Note (Signed)
At the end of the visit [name] [name] let us know that his heart rates have been fluctuating up as high as the 150s with dizziness, blood pressures did show heart rates in the 140s, twelve-lead ECG done at the end of the visit showed atrial flutter with varying degrees of atrial ventricular block with a rate of 126 bpm but as high as 150 bpm. He is currently on amiodarone and has had a cardioversion recently. Due to the symptoms I do think he needs to proceed to the ED, he will present to [place] where his ablation was done.

## 2022-02-17 NOTE — Assessment & Plan Note (Signed)
Orthovisc No. 2 of 4 both knees, return in 1 week for #3 of 4.

## 2022-02-18 DIAGNOSIS — J90 Pleural effusion, not elsewhere classified: Secondary | ICD-10-CM | POA: Diagnosis not present

## 2022-02-18 DIAGNOSIS — I4891 Unspecified atrial fibrillation: Secondary | ICD-10-CM | POA: Diagnosis not present

## 2022-02-18 DIAGNOSIS — R918 Other nonspecific abnormal finding of lung field: Secondary | ICD-10-CM | POA: Diagnosis not present

## 2022-02-18 DIAGNOSIS — I4892 Unspecified atrial flutter: Secondary | ICD-10-CM | POA: Diagnosis not present

## 2022-02-18 NOTE — Assessment & Plan Note (Signed)
Tender left-sided occipital swelling/mass was not visible on ultrasound, proceeding with CT neck with contrast.

## 2022-02-18 NOTE — Addendum Note (Signed)
Addended by: Silverio Decamp on: 02/18/2022 04:39 PM   Modules accepted: Orders

## 2022-02-22 DIAGNOSIS — Z9484 Stem cells transplant status: Secondary | ICD-10-CM | POA: Diagnosis not present

## 2022-02-22 DIAGNOSIS — Z7901 Long term (current) use of anticoagulants: Secondary | ICD-10-CM | POA: Diagnosis not present

## 2022-02-22 DIAGNOSIS — Z5112 Encounter for antineoplastic immunotherapy: Secondary | ICD-10-CM | POA: Diagnosis not present

## 2022-02-22 DIAGNOSIS — C9002 Multiple myeloma in relapse: Secondary | ICD-10-CM | POA: Diagnosis not present

## 2022-02-22 DIAGNOSIS — C9001 Multiple myeloma in remission: Secondary | ICD-10-CM | POA: Diagnosis not present

## 2022-02-24 ENCOUNTER — Ambulatory Visit (INDEPENDENT_AMBULATORY_CARE_PROVIDER_SITE_OTHER): Payer: Medicare Other

## 2022-02-24 ENCOUNTER — Ambulatory Visit (INDEPENDENT_AMBULATORY_CARE_PROVIDER_SITE_OTHER): Payer: Medicare Other | Admitting: Sports Medicine

## 2022-02-24 DIAGNOSIS — M170 Bilateral primary osteoarthritis of knee: Secondary | ICD-10-CM

## 2022-02-24 DIAGNOSIS — I484 Atypical atrial flutter: Secondary | ICD-10-CM | POA: Diagnosis not present

## 2022-02-24 DIAGNOSIS — R221 Localized swelling, mass and lump, neck: Secondary | ICD-10-CM | POA: Diagnosis not present

## 2022-02-24 MED ORDER — HYALURONAN 30 MG/2ML IX SOSY
30.0000 mg | PREFILLED_SYRINGE | Freq: Once | INTRA_ARTICULAR | Status: AC
  Administered 2022-02-24: 30 mg via INTRA_ARTICULAR

## 2022-02-24 NOTE — Assessment & Plan Note (Signed)
Orthovisc No. 3 of 4 both knees, return in 1 week for #4 of 4. 

## 2022-02-24 NOTE — Assessment & Plan Note (Signed)
Not seen on ultrasound, proceeding with CT with contrast

## 2022-02-24 NOTE — Assessment & Plan Note (Signed)
I sent Yandel to the hospital at his last Orthovisc injection as he was in atrial flutter confirmed on ECG with ventricular rate in the 150s. He was chemically cardioverted and ablation is planned.

## 2022-02-24 NOTE — Progress Notes (Signed)
    Procedures performed today:    Procedure: Real-time Ultrasound Guided injection of the left knee Device: Samsung HS60  Verbal informed consent obtained.  Time-out conducted.  Noted no overlying erythema, induration, or other signs of local infection.  Skin prepped in a sterile fashion.  Local anesthesia: Topical Ethyl chloride.  With sterile technique and under real time ultrasound guidance: Trace effusion noted, 30 mg/2 mL of OrthoVisc (sodium hyaluronate) in a prefilled syringe was injected easily into the knee through a 22-gauge needle. Completed without difficulty  Advised to call if fevers/chills, erythema, induration, drainage, or persistent bleeding.  Images permanently stored and available for review in PACS.  Impression: Technically successful ultrasound guided injection.   Procedure: Real-time Ultrasound Guided injection of the right knee Device: Samsung HS60  Verbal informed consent obtained.  Time-out conducted.  Noted no overlying erythema, induration, or other signs of local infection.  Skin prepped in a sterile fashion.  Local anesthesia: Topical Ethyl chloride.  With sterile technique and under real time ultrasound guidance: Trace effusion noted, 30 mg/2 mL of OrthoVisc (sodium hyaluronate) in a prefilled syringe was injected easily into the knee through a 22-gauge needle. Completed without difficulty  Advised to call if fevers/chills, erythema, induration, drainage, or persistent bleeding.  Images permanently stored and available for review in PACS.  Impression: Technically successful ultrasound guided injection.  Independent interpretation of notes and tests performed by another provider:   None.  Brief History, Exam, Impression, and Recommendations:    Primary osteoarthritis of both knees Orthovisc No. 3 of 4 both knees, return in 1 week for #4 of 4.  Atrial flutter ([place]) I sent Faron to the hospital at his last Orthovisc injection as he was in atrial  flutter confirmed on ECG with ventricular rate in the 150s. He was chemically cardioverted and ablation is planned.  Mass in neck Not seen on ultrasound, proceeding with CT with contrast    ____________________________________________ Gwen Her. Dianah Field, M.D., ABFM., CAQSM., AME. Primary Care and Sports Medicine [place] [facility]  Adjunct Professor of [place] of [employer] [facility] [employer] [facility] [employer] [facility] [employer] [facility] of Medicine  Risk manager

## 2022-02-25 DIAGNOSIS — C9000 Multiple myeloma not having achieved remission: Secondary | ICD-10-CM | POA: Diagnosis not present

## 2022-02-25 DIAGNOSIS — I5022 Chronic systolic (congestive) heart failure: Secondary | ICD-10-CM | POA: Diagnosis not present

## 2022-02-25 DIAGNOSIS — I480 Paroxysmal atrial fibrillation: Secondary | ICD-10-CM | POA: Diagnosis not present

## 2022-02-25 DIAGNOSIS — I2510 Atherosclerotic heart disease of native coronary artery without angina pectoris: Secondary | ICD-10-CM | POA: Diagnosis not present

## 2022-02-28 ENCOUNTER — Ambulatory Visit (INDEPENDENT_AMBULATORY_CARE_PROVIDER_SITE_OTHER): Payer: Medicare Other

## 2022-02-28 DIAGNOSIS — Z981 Arthrodesis status: Secondary | ICD-10-CM | POA: Diagnosis not present

## 2022-02-28 DIAGNOSIS — R221 Localized swelling, mass and lump, neck: Secondary | ICD-10-CM | POA: Diagnosis not present

## 2022-02-28 DIAGNOSIS — R220 Localized swelling, mass and lump, head: Secondary | ICD-10-CM | POA: Diagnosis not present

## 2022-02-28 DIAGNOSIS — M47812 Spondylosis without myelopathy or radiculopathy, cervical region: Secondary | ICD-10-CM | POA: Diagnosis not present

## 2022-02-28 MED ORDER — IOHEXOL 300 MG/ML  SOLN
100.0000 mL | Freq: Once | INTRAMUSCULAR | Status: AC | PRN
  Administered 2022-02-28: 75 mL via INTRAVENOUS

## 2022-03-01 DIAGNOSIS — Z5111 Encounter for antineoplastic chemotherapy: Secondary | ICD-10-CM | POA: Diagnosis not present

## 2022-03-01 DIAGNOSIS — C9002 Multiple myeloma in relapse: Secondary | ICD-10-CM | POA: Diagnosis not present

## 2022-03-03 ENCOUNTER — Ambulatory Visit: Payer: Self-pay

## 2022-03-03 ENCOUNTER — Ambulatory Visit (INDEPENDENT_AMBULATORY_CARE_PROVIDER_SITE_OTHER): Payer: Medicare Other | Admitting: Sports Medicine

## 2022-03-03 DIAGNOSIS — M47815 Spondylosis without myelopathy or radiculopathy, thoracolumbar region: Secondary | ICD-10-CM

## 2022-03-03 DIAGNOSIS — M170 Bilateral primary osteoarthritis of knee: Secondary | ICD-10-CM | POA: Diagnosis not present

## 2022-03-03 MED ORDER — XTAMPZA ER 9 MG PO C12A: 1.0000 | EXTENDED_RELEASE_CAPSULE | Freq: Two times a day (BID) | ORAL | 0 refills | Status: DC

## 2022-03-03 MED ORDER — OXYCODONE-ACETAMINOPHEN 10-325 MG PO TABS: ORAL_TABLET | ORAL | 0 refills | Status: DC

## 2022-03-03 NOTE — Assessment & Plan Note (Addendum)
Orthovisc 4 of 4 both knees, return to see me in 4 to 6 weeks as needed. He tells me his knees are doing really well today. He does need a refill on Xtampza and as needed oxycodone for his chronic pain relief.

## 2022-03-03 NOTE — Progress Notes (Signed)
    Procedures performed today:    Procedure: Real-time Ultrasound Guided injection of the left knee Device: Samsung HS60  Verbal informed consent obtained.  Time-out conducted.  Noted no overlying erythema, induration, or other signs of local infection.  Skin prepped in a sterile fashion.  Local anesthesia: Topical Ethyl chloride.  With sterile technique and under real time ultrasound guidance: Trace effusion noted, 30 mg/2 mL of OrthoVisc (sodium hyaluronate) in a prefilled syringe was injected easily into the knee through a 22-gauge needle. Completed without difficulty  Advised to call if fevers/chills, erythema, induration, drainage, or persistent bleeding.  Images permanently stored and available for review in PACS.  Impression: Technically successful ultrasound guided injection.   Procedure: Real-time Ultrasound Guided injection of the right knee Device: Samsung HS60  Verbal informed consent obtained.  Time-out conducted.  Noted no overlying erythema, induration, or other signs of local infection.  Skin prepped in a sterile fashion.  Local anesthesia: Topical Ethyl chloride.  With sterile technique and under real time ultrasound guidance: Trace effusion noted, 30 mg/2 mL of OrthoVisc (sodium hyaluronate) in a prefilled syringe was injected easily into the knee through a 22-gauge needle. Completed without difficulty  Advised to call if fevers/chills, erythema, induration, drainage, or persistent bleeding.  Images permanently stored and available for review in PACS.  Impression: Technically successful ultrasound guided injection.  Independent interpretation of notes and tests performed by another provider:   None.  Brief History, Exam, Impression, and Recommendations:    Primary osteoarthritis of both knees Orthovisc 4 of 4 both knees, return to see me in 4 to 6 weeks as needed. He tells me his knees are doing really well today. He does need a refill on Xtampza and as  needed oxycodone for his chronic pain relief.    ____________________________________________ Gwen Her. Dianah Field, M.D., ABFM., CAQSM., AME. Primary Care and Sports Medicine  [facility]  Adjunct Professor of [place] [employer] [facility] of Medicine  Risk manager

## 2022-03-07 DIAGNOSIS — J329 Chronic sinusitis, unspecified: Secondary | ICD-10-CM | POA: Diagnosis not present

## 2022-03-07 DIAGNOSIS — J479 Bronchiectasis, uncomplicated: Secondary | ICD-10-CM | POA: Diagnosis not present

## 2022-03-07 DIAGNOSIS — G4733 Obstructive sleep apnea (adult) (pediatric): Secondary | ICD-10-CM | POA: Diagnosis not present

## 2022-03-07 DIAGNOSIS — Z86711 Personal history of pulmonary embolism: Secondary | ICD-10-CM | POA: Diagnosis not present

## 2022-03-07 DIAGNOSIS — Z9989 Dependence on other enabling machines and devices: Secondary | ICD-10-CM | POA: Diagnosis not present

## 2022-03-08 DIAGNOSIS — C9002 Multiple myeloma in relapse: Secondary | ICD-10-CM | POA: Diagnosis not present

## 2022-03-08 DIAGNOSIS — Z5112 Encounter for antineoplastic immunotherapy: Secondary | ICD-10-CM | POA: Diagnosis not present

## 2022-03-08 DIAGNOSIS — R39198 Other difficulties with micturition: Secondary | ICD-10-CM | POA: Diagnosis not present

## 2022-03-08 DIAGNOSIS — N401 Enlarged prostate with lower urinary tract symptoms: Secondary | ICD-10-CM | POA: Diagnosis not present

## 2022-03-08 DIAGNOSIS — N138 Other obstructive and reflux uropathy: Secondary | ICD-10-CM | POA: Diagnosis not present

## 2022-03-10 DIAGNOSIS — C9000 Multiple myeloma not having achieved remission: Secondary | ICD-10-CM | POA: Diagnosis not present

## 2022-03-11 DIAGNOSIS — Z79899 Other long term (current) drug therapy: Secondary | ICD-10-CM | POA: Diagnosis not present

## 2022-03-12 ENCOUNTER — Other Ambulatory Visit: Payer: Self-pay | Admitting: Sports Medicine

## 2022-03-12 ENCOUNTER — Other Ambulatory Visit: Payer: Self-pay | Admitting: Hematology & Oncology

## 2022-03-14 DIAGNOSIS — J320 Chronic maxillary sinusitis: Secondary | ICD-10-CM | POA: Diagnosis not present

## 2022-03-14 DIAGNOSIS — J342 Deviated nasal septum: Secondary | ICD-10-CM | POA: Diagnosis not present

## 2022-03-14 DIAGNOSIS — J329 Chronic sinusitis, unspecified: Secondary | ICD-10-CM | POA: Diagnosis not present

## 2022-03-15 DIAGNOSIS — C9002 Multiple myeloma in relapse: Secondary | ICD-10-CM | POA: Diagnosis not present

## 2022-03-21 DIAGNOSIS — G4733 Obstructive sleep apnea (adult) (pediatric): Secondary | ICD-10-CM | POA: Diagnosis not present

## 2022-03-22 DIAGNOSIS — Z5111 Encounter for antineoplastic chemotherapy: Secondary | ICD-10-CM | POA: Diagnosis not present

## 2022-03-22 DIAGNOSIS — Z7982 Long term (current) use of aspirin: Secondary | ICD-10-CM | POA: Diagnosis not present

## 2022-03-22 DIAGNOSIS — Z9484 Stem cells transplant status: Secondary | ICD-10-CM | POA: Diagnosis not present

## 2022-03-22 DIAGNOSIS — C9001 Multiple myeloma in remission: Secondary | ICD-10-CM | POA: Diagnosis not present

## 2022-03-22 DIAGNOSIS — C9002 Multiple myeloma in relapse: Secondary | ICD-10-CM | POA: Diagnosis not present

## 2022-03-22 DIAGNOSIS — Z79899 Other long term (current) drug therapy: Secondary | ICD-10-CM | POA: Diagnosis not present

## 2022-03-22 DIAGNOSIS — Z7901 Long term (current) use of anticoagulants: Secondary | ICD-10-CM | POA: Diagnosis not present

## 2022-03-29 DIAGNOSIS — I480 Paroxysmal atrial fibrillation: Secondary | ICD-10-CM | POA: Diagnosis not present

## 2022-03-29 DIAGNOSIS — Z5112 Encounter for antineoplastic immunotherapy: Secondary | ICD-10-CM | POA: Diagnosis not present

## 2022-03-29 DIAGNOSIS — C9000 Multiple myeloma not having achieved remission: Secondary | ICD-10-CM | POA: Diagnosis not present

## 2022-03-29 DIAGNOSIS — C9002 Multiple myeloma in relapse: Secondary | ICD-10-CM | POA: Diagnosis not present

## 2022-04-04 ENCOUNTER — Other Ambulatory Visit: Payer: Self-pay

## 2022-04-04 DIAGNOSIS — M47815 Spondylosis without myelopathy or radiculopathy, thoracolumbar region: Secondary | ICD-10-CM

## 2022-04-04 MED ORDER — XTAMPZA ER 9 MG PO C12A: 1.0000 | EXTENDED_RELEASE_CAPSULE | Freq: Two times a day (BID) | ORAL | 0 refills | Status: DC

## 2022-04-04 NOTE — Telephone Encounter (Signed)
Message sent to patient via Mychart  that prescription was refilled.

## 2022-04-04 NOTE — Telephone Encounter (Signed)
Requesting rx rf of [name] [name] 10-325 Last written 03/03/2022 Last OV 03/03/2022 No upcoming appt schld.

## 2022-04-05 DIAGNOSIS — Z5112 Encounter for antineoplastic immunotherapy: Secondary | ICD-10-CM | POA: Diagnosis not present

## 2022-04-05 DIAGNOSIS — C9000 Multiple myeloma not having achieved remission: Secondary | ICD-10-CM | POA: Diagnosis not present

## 2022-04-07 DIAGNOSIS — I5022 Chronic systolic (congestive) heart failure: Secondary | ICD-10-CM | POA: Diagnosis not present

## 2022-04-07 DIAGNOSIS — C9000 Multiple myeloma not having achieved remission: Secondary | ICD-10-CM | POA: Diagnosis not present

## 2022-04-07 DIAGNOSIS — I480 Paroxysmal atrial fibrillation: Secondary | ICD-10-CM | POA: Diagnosis not present

## 2022-04-13 DIAGNOSIS — N401 Enlarged prostate with lower urinary tract symptoms: Secondary | ICD-10-CM | POA: Diagnosis not present

## 2022-04-13 DIAGNOSIS — R39198 Other difficulties with micturition: Secondary | ICD-10-CM | POA: Diagnosis not present

## 2022-04-13 DIAGNOSIS — N138 Other obstructive and reflux uropathy: Secondary | ICD-10-CM | POA: Diagnosis not present

## 2022-04-13 DIAGNOSIS — N471 Phimosis: Secondary | ICD-10-CM | POA: Diagnosis not present

## 2022-04-18 DIAGNOSIS — I2510 Atherosclerotic heart disease of native coronary artery without angina pectoris: Secondary | ICD-10-CM | POA: Diagnosis not present

## 2022-04-18 DIAGNOSIS — Z1330 Encounter for screening examination for mental health and behavioral disorders, unspecified: Secondary | ICD-10-CM | POA: Diagnosis not present

## 2022-04-18 DIAGNOSIS — I255 Ischemic cardiomyopathy: Secondary | ICD-10-CM | POA: Diagnosis not present

## 2022-04-18 DIAGNOSIS — I480 Paroxysmal atrial fibrillation: Secondary | ICD-10-CM | POA: Diagnosis not present

## 2022-04-18 DIAGNOSIS — I10 Essential (primary) hypertension: Secondary | ICD-10-CM | POA: Diagnosis not present

## 2022-04-18 DIAGNOSIS — Z955 Presence of coronary angioplasty implant and graft: Secondary | ICD-10-CM | POA: Diagnosis not present

## 2022-04-19 ENCOUNTER — Other Ambulatory Visit: Payer: Self-pay | Admitting: Sports Medicine

## 2022-04-19 DIAGNOSIS — Z5111 Encounter for antineoplastic chemotherapy: Secondary | ICD-10-CM | POA: Diagnosis not present

## 2022-04-19 DIAGNOSIS — Z79899 Other long term (current) drug therapy: Secondary | ICD-10-CM | POA: Diagnosis not present

## 2022-04-19 DIAGNOSIS — C9002 Multiple myeloma in relapse: Secondary | ICD-10-CM | POA: Diagnosis not present

## 2022-04-19 DIAGNOSIS — Z7901 Long term (current) use of anticoagulants: Secondary | ICD-10-CM | POA: Diagnosis not present

## 2022-04-19 DIAGNOSIS — Z9484 Stem cells transplant status: Secondary | ICD-10-CM | POA: Diagnosis not present

## 2022-04-19 DIAGNOSIS — C9001 Multiple myeloma in remission: Secondary | ICD-10-CM | POA: Diagnosis not present

## 2022-04-26 DIAGNOSIS — R918 Other nonspecific abnormal finding of lung field: Secondary | ICD-10-CM | POA: Diagnosis not present

## 2022-04-26 DIAGNOSIS — I10 Essential (primary) hypertension: Secondary | ICD-10-CM | POA: Diagnosis not present

## 2022-04-26 DIAGNOSIS — I480 Paroxysmal atrial fibrillation: Secondary | ICD-10-CM | POA: Diagnosis not present

## 2022-04-26 DIAGNOSIS — Z01810 Encounter for preprocedural cardiovascular examination: Secondary | ICD-10-CM | POA: Diagnosis not present

## 2022-04-26 DIAGNOSIS — Z01818 Encounter for other preprocedural examination: Secondary | ICD-10-CM | POA: Diagnosis not present

## 2022-04-26 DIAGNOSIS — J8410 Pulmonary fibrosis, unspecified: Secondary | ICD-10-CM | POA: Diagnosis not present

## 2022-04-26 DIAGNOSIS — Z7901 Long term (current) use of anticoagulants: Secondary | ICD-10-CM | POA: Diagnosis not present

## 2022-04-26 DIAGNOSIS — Z01812 Encounter for preprocedural laboratory examination: Secondary | ICD-10-CM | POA: Diagnosis not present

## 2022-04-26 DIAGNOSIS — Z79899 Other long term (current) drug therapy: Secondary | ICD-10-CM | POA: Diagnosis not present

## 2022-04-26 DIAGNOSIS — I2510 Atherosclerotic heart disease of native coronary artery without angina pectoris: Secondary | ICD-10-CM | POA: Diagnosis not present

## 2022-04-28 DIAGNOSIS — I4892 Unspecified atrial flutter: Secondary | ICD-10-CM | POA: Diagnosis not present

## 2022-04-28 DIAGNOSIS — K449 Diaphragmatic hernia without obstruction or gangrene: Secondary | ICD-10-CM | POA: Diagnosis not present

## 2022-04-28 DIAGNOSIS — Z79899 Other long term (current) drug therapy: Secondary | ICD-10-CM | POA: Diagnosis not present

## 2022-04-28 DIAGNOSIS — Z7982 Long term (current) use of aspirin: Secondary | ICD-10-CM | POA: Diagnosis not present

## 2022-04-28 DIAGNOSIS — E785 Hyperlipidemia, unspecified: Secondary | ICD-10-CM | POA: Diagnosis not present

## 2022-04-28 DIAGNOSIS — I255 Ischemic cardiomyopathy: Secondary | ICD-10-CM | POA: Diagnosis not present

## 2022-04-28 DIAGNOSIS — I4891 Unspecified atrial fibrillation: Secondary | ICD-10-CM | POA: Diagnosis not present

## 2022-04-28 DIAGNOSIS — E039 Hypothyroidism, unspecified: Secondary | ICD-10-CM | POA: Diagnosis not present

## 2022-04-28 DIAGNOSIS — Z79890 Hormone replacement therapy (postmenopausal): Secondary | ICD-10-CM | POA: Diagnosis not present

## 2022-04-28 DIAGNOSIS — I110 Hypertensive heart disease with heart failure: Secondary | ICD-10-CM | POA: Diagnosis not present

## 2022-04-28 DIAGNOSIS — I480 Paroxysmal atrial fibrillation: Secondary | ICD-10-CM | POA: Diagnosis not present

## 2022-04-28 DIAGNOSIS — I130 Hypertensive heart and chronic kidney disease with heart failure and stage 1 through stage 4 chronic kidney disease, or unspecified chronic kidney disease: Secondary | ICD-10-CM | POA: Diagnosis not present

## 2022-04-28 DIAGNOSIS — E1122 Type 2 diabetes mellitus with diabetic chronic kidney disease: Secondary | ICD-10-CM | POA: Diagnosis not present

## 2022-04-28 DIAGNOSIS — K219 Gastro-esophageal reflux disease without esophagitis: Secondary | ICD-10-CM | POA: Diagnosis not present

## 2022-04-28 DIAGNOSIS — I2510 Atherosclerotic heart disease of native coronary artery without angina pectoris: Secondary | ICD-10-CM | POA: Diagnosis not present

## 2022-04-28 DIAGNOSIS — J449 Chronic obstructive pulmonary disease, unspecified: Secondary | ICD-10-CM | POA: Diagnosis not present

## 2022-04-28 DIAGNOSIS — Z87891 Personal history of nicotine dependence: Secondary | ICD-10-CM | POA: Diagnosis not present

## 2022-04-28 DIAGNOSIS — G709 Myoneural disorder, unspecified: Secondary | ICD-10-CM | POA: Diagnosis not present

## 2022-04-28 DIAGNOSIS — I5020 Unspecified systolic (congestive) heart failure: Secondary | ICD-10-CM | POA: Diagnosis not present

## 2022-04-28 DIAGNOSIS — N183 Chronic kidney disease, stage 3 unspecified: Secondary | ICD-10-CM | POA: Diagnosis not present

## 2022-04-28 DIAGNOSIS — Z7901 Long term (current) use of anticoagulants: Secondary | ICD-10-CM | POA: Diagnosis not present

## 2022-04-28 DIAGNOSIS — I509 Heart failure, unspecified: Secondary | ICD-10-CM | POA: Diagnosis not present

## 2022-04-28 DIAGNOSIS — G4730 Sleep apnea, unspecified: Secondary | ICD-10-CM | POA: Diagnosis not present

## 2022-04-28 DIAGNOSIS — G4733 Obstructive sleep apnea (adult) (pediatric): Secondary | ICD-10-CM | POA: Diagnosis not present

## 2022-04-29 DIAGNOSIS — J449 Chronic obstructive pulmonary disease, unspecified: Secondary | ICD-10-CM | POA: Diagnosis not present

## 2022-04-29 DIAGNOSIS — E785 Hyperlipidemia, unspecified: Secondary | ICD-10-CM | POA: Diagnosis not present

## 2022-04-29 DIAGNOSIS — Z87891 Personal history of nicotine dependence: Secondary | ICD-10-CM | POA: Diagnosis not present

## 2022-04-29 DIAGNOSIS — I4892 Unspecified atrial flutter: Secondary | ICD-10-CM | POA: Diagnosis not present

## 2022-04-29 DIAGNOSIS — G4733 Obstructive sleep apnea (adult) (pediatric): Secondary | ICD-10-CM | POA: Diagnosis not present

## 2022-04-29 DIAGNOSIS — I480 Paroxysmal atrial fibrillation: Secondary | ICD-10-CM | POA: Diagnosis not present

## 2022-04-29 DIAGNOSIS — I2510 Atherosclerotic heart disease of native coronary artery without angina pectoris: Secondary | ICD-10-CM | POA: Diagnosis not present

## 2022-05-02 DIAGNOSIS — I10 Essential (primary) hypertension: Secondary | ICD-10-CM | POA: Diagnosis not present

## 2022-05-02 DIAGNOSIS — R0602 Shortness of breath: Secondary | ICD-10-CM | POA: Diagnosis not present

## 2022-05-02 DIAGNOSIS — I482 Chronic atrial fibrillation, unspecified: Secondary | ICD-10-CM | POA: Diagnosis not present

## 2022-05-02 DIAGNOSIS — Z87891 Personal history of nicotine dependence: Secondary | ICD-10-CM | POA: Diagnosis not present

## 2022-05-02 DIAGNOSIS — R079 Chest pain, unspecified: Secondary | ICD-10-CM | POA: Diagnosis not present

## 2022-05-02 DIAGNOSIS — G4733 Obstructive sleep apnea (adult) (pediatric): Secondary | ICD-10-CM | POA: Diagnosis not present

## 2022-05-02 DIAGNOSIS — Z7901 Long term (current) use of anticoagulants: Secondary | ICD-10-CM | POA: Diagnosis not present

## 2022-05-02 DIAGNOSIS — I483 Typical atrial flutter: Secondary | ICD-10-CM | POA: Diagnosis not present

## 2022-05-02 DIAGNOSIS — Z888 Allergy status to other drugs, medicaments and biological substances status: Secondary | ICD-10-CM | POA: Diagnosis not present

## 2022-05-02 DIAGNOSIS — Z7982 Long term (current) use of aspirin: Secondary | ICD-10-CM | POA: Diagnosis not present

## 2022-05-02 DIAGNOSIS — Z91018 Allergy to other foods: Secondary | ICD-10-CM | POA: Diagnosis not present

## 2022-05-02 DIAGNOSIS — Z885 Allergy status to narcotic agent status: Secondary | ICD-10-CM | POA: Diagnosis not present

## 2022-05-02 DIAGNOSIS — R0789 Other chest pain: Secondary | ICD-10-CM | POA: Diagnosis not present

## 2022-05-02 DIAGNOSIS — E785 Hyperlipidemia, unspecified: Secondary | ICD-10-CM | POA: Diagnosis not present

## 2022-05-02 DIAGNOSIS — Z79890 Hormone replacement therapy (postmenopausal): Secondary | ICD-10-CM | POA: Diagnosis not present

## 2022-05-02 DIAGNOSIS — I2510 Atherosclerotic heart disease of native coronary artery without angina pectoris: Secondary | ICD-10-CM | POA: Diagnosis not present

## 2022-05-02 DIAGNOSIS — I4891 Unspecified atrial fibrillation: Secondary | ICD-10-CM | POA: Diagnosis not present

## 2022-05-02 DIAGNOSIS — R918 Other nonspecific abnormal finding of lung field: Secondary | ICD-10-CM | POA: Diagnosis not present

## 2022-05-02 DIAGNOSIS — J449 Chronic obstructive pulmonary disease, unspecified: Secondary | ICD-10-CM | POA: Diagnosis not present

## 2022-05-02 DIAGNOSIS — K219 Gastro-esophageal reflux disease without esophagitis: Secondary | ICD-10-CM | POA: Diagnosis not present

## 2022-05-02 DIAGNOSIS — Z7952 Long term (current) use of systemic steroids: Secondary | ICD-10-CM | POA: Diagnosis not present

## 2022-05-02 DIAGNOSIS — I447 Left bundle-branch block, unspecified: Secondary | ICD-10-CM | POA: Diagnosis not present

## 2022-05-02 DIAGNOSIS — Z79899 Other long term (current) drug therapy: Secondary | ICD-10-CM | POA: Diagnosis not present

## 2022-05-03 DIAGNOSIS — I2510 Atherosclerotic heart disease of native coronary artery without angina pectoris: Secondary | ICD-10-CM | POA: Diagnosis not present

## 2022-05-03 DIAGNOSIS — I4891 Unspecified atrial fibrillation: Secondary | ICD-10-CM | POA: Diagnosis not present

## 2022-05-03 DIAGNOSIS — I10 Essential (primary) hypertension: Secondary | ICD-10-CM | POA: Diagnosis not present

## 2022-05-03 MED ORDER — XTAMPZA ER 9 MG PO C12A: 1.0000 | EXTENDED_RELEASE_CAPSULE | Freq: Two times a day (BID) | ORAL | 0 refills | Status: DC

## 2022-05-10 DIAGNOSIS — R000 Tachycardia, unspecified: Secondary | ICD-10-CM | POA: Diagnosis not present

## 2022-05-10 DIAGNOSIS — I951 Orthostatic hypotension: Secondary | ICD-10-CM | POA: Diagnosis not present

## 2022-05-10 DIAGNOSIS — Z7901 Long term (current) use of anticoagulants: Secondary | ICD-10-CM | POA: Diagnosis not present

## 2022-05-10 DIAGNOSIS — Z87891 Personal history of nicotine dependence: Secondary | ICD-10-CM | POA: Diagnosis not present

## 2022-05-10 DIAGNOSIS — M899 Disorder of bone, unspecified: Secondary | ICD-10-CM | POA: Diagnosis not present

## 2022-05-10 DIAGNOSIS — E860 Dehydration: Secondary | ICD-10-CM | POA: Diagnosis not present

## 2022-05-10 DIAGNOSIS — Z9484 Stem cells transplant status: Secondary | ICD-10-CM | POA: Diagnosis not present

## 2022-05-10 DIAGNOSIS — C9001 Multiple myeloma in remission: Secondary | ICD-10-CM | POA: Diagnosis not present

## 2022-05-11 DIAGNOSIS — J449 Chronic obstructive pulmonary disease, unspecified: Secondary | ICD-10-CM | POA: Diagnosis not present

## 2022-05-11 DIAGNOSIS — E039 Hypothyroidism, unspecified: Secondary | ICD-10-CM | POA: Diagnosis present

## 2022-05-11 DIAGNOSIS — I110 Hypertensive heart disease with heart failure: Secondary | ICD-10-CM | POA: Diagnosis not present

## 2022-05-11 DIAGNOSIS — R4182 Altered mental status, unspecified: Secondary | ICD-10-CM | POA: Diagnosis not present

## 2022-05-11 DIAGNOSIS — R069 Unspecified abnormalities of breathing: Secondary | ICD-10-CM | POA: Diagnosis not present

## 2022-05-11 DIAGNOSIS — R002 Palpitations: Secondary | ICD-10-CM | POA: Diagnosis not present

## 2022-05-11 DIAGNOSIS — K219 Gastro-esophageal reflux disease without esophagitis: Secondary | ICD-10-CM | POA: Diagnosis not present

## 2022-05-11 DIAGNOSIS — I4892 Unspecified atrial flutter: Secondary | ICD-10-CM | POA: Diagnosis not present

## 2022-05-11 DIAGNOSIS — R079 Chest pain, unspecified: Secondary | ICD-10-CM | POA: Diagnosis not present

## 2022-05-11 DIAGNOSIS — I4891 Unspecified atrial fibrillation: Secondary | ICD-10-CM | POA: Diagnosis not present

## 2022-05-11 DIAGNOSIS — J189 Pneumonia, unspecified organism: Secondary | ICD-10-CM | POA: Diagnosis present

## 2022-05-11 DIAGNOSIS — Z91048 Other nonmedicinal substance allergy status: Secondary | ICD-10-CM | POA: Diagnosis not present

## 2022-05-11 DIAGNOSIS — J45909 Unspecified asthma, uncomplicated: Secondary | ICD-10-CM | POA: Diagnosis not present

## 2022-05-11 DIAGNOSIS — I5020 Unspecified systolic (congestive) heart failure: Secondary | ICD-10-CM | POA: Diagnosis present

## 2022-05-11 DIAGNOSIS — I2510 Atherosclerotic heart disease of native coronary artery without angina pectoris: Secondary | ICD-10-CM | POA: Diagnosis present

## 2022-05-11 DIAGNOSIS — G629 Polyneuropathy, unspecified: Secondary | ICD-10-CM | POA: Diagnosis present

## 2022-05-11 DIAGNOSIS — Z885 Allergy status to narcotic agent status: Secondary | ICD-10-CM | POA: Diagnosis not present

## 2022-05-11 DIAGNOSIS — I499 Cardiac arrhythmia, unspecified: Secondary | ICD-10-CM | POA: Diagnosis not present

## 2022-05-11 DIAGNOSIS — I484 Atypical atrial flutter: Secondary | ICD-10-CM | POA: Diagnosis not present

## 2022-05-11 DIAGNOSIS — Z9989 Dependence on other enabling machines and devices: Secondary | ICD-10-CM | POA: Diagnosis not present

## 2022-05-11 DIAGNOSIS — R0602 Shortness of breath: Secondary | ICD-10-CM | POA: Diagnosis not present

## 2022-05-11 DIAGNOSIS — R059 Cough, unspecified: Secondary | ICD-10-CM | POA: Diagnosis not present

## 2022-05-11 DIAGNOSIS — Z1152 Encounter for screening for COVID-19: Secondary | ICD-10-CM | POA: Diagnosis not present

## 2022-05-11 DIAGNOSIS — I130 Hypertensive heart and chronic kidney disease with heart failure and stage 1 through stage 4 chronic kidney disease, or unspecified chronic kidney disease: Secondary | ICD-10-CM | POA: Diagnosis present

## 2022-05-11 DIAGNOSIS — R110 Nausea: Secondary | ICD-10-CM | POA: Diagnosis not present

## 2022-05-11 DIAGNOSIS — G4733 Obstructive sleep apnea (adult) (pediatric): Secondary | ICD-10-CM | POA: Diagnosis present

## 2022-05-11 DIAGNOSIS — E1140 Type 2 diabetes mellitus with diabetic neuropathy, unspecified: Secondary | ICD-10-CM | POA: Diagnosis not present

## 2022-05-11 DIAGNOSIS — Z87891 Personal history of nicotine dependence: Secondary | ICD-10-CM | POA: Diagnosis not present

## 2022-05-11 DIAGNOSIS — N183 Chronic kidney disease, stage 3 unspecified: Secondary | ICD-10-CM | POA: Diagnosis present

## 2022-05-11 DIAGNOSIS — I480 Paroxysmal atrial fibrillation: Secondary | ICD-10-CM | POA: Diagnosis present

## 2022-05-11 DIAGNOSIS — R5383 Other fatigue: Secondary | ICD-10-CM | POA: Diagnosis not present

## 2022-05-11 DIAGNOSIS — I255 Ischemic cardiomyopathy: Secondary | ICD-10-CM | POA: Diagnosis present

## 2022-05-11 DIAGNOSIS — Z20822 Contact with and (suspected) exposure to covid-19: Secondary | ICD-10-CM | POA: Diagnosis not present

## 2022-05-11 DIAGNOSIS — I10 Essential (primary) hypertension: Secondary | ICD-10-CM | POA: Diagnosis not present

## 2022-05-11 DIAGNOSIS — D649 Anemia, unspecified: Secondary | ICD-10-CM | POA: Diagnosis present

## 2022-05-11 DIAGNOSIS — R0902 Hypoxemia: Secondary | ICD-10-CM | POA: Diagnosis not present

## 2022-05-11 DIAGNOSIS — C9000 Multiple myeloma not having achieved remission: Secondary | ICD-10-CM | POA: Diagnosis present

## 2022-05-11 DIAGNOSIS — I080 Rheumatic disorders of both mitral and aortic valves: Secondary | ICD-10-CM | POA: Diagnosis not present

## 2022-05-11 DIAGNOSIS — N400 Enlarged prostate without lower urinary tract symptoms: Secondary | ICD-10-CM | POA: Diagnosis present

## 2022-05-11 DIAGNOSIS — I471 Supraventricular tachycardia, unspecified: Secondary | ICD-10-CM | POA: Diagnosis not present

## 2022-05-11 DIAGNOSIS — I509 Heart failure, unspecified: Secondary | ICD-10-CM | POA: Diagnosis not present

## 2022-05-11 DIAGNOSIS — J9601 Acute respiratory failure with hypoxia: Secondary | ICD-10-CM | POA: Diagnosis present

## 2022-05-11 DIAGNOSIS — R918 Other nonspecific abnormal finding of lung field: Secondary | ICD-10-CM | POA: Diagnosis not present

## 2022-05-12 DIAGNOSIS — I499 Cardiac arrhythmia, unspecified: Secondary | ICD-10-CM | POA: Diagnosis not present

## 2022-05-12 DIAGNOSIS — I484 Atypical atrial flutter: Secondary | ICD-10-CM | POA: Diagnosis not present

## 2022-05-12 DIAGNOSIS — I4892 Unspecified atrial flutter: Secondary | ICD-10-CM | POA: Diagnosis not present

## 2022-05-12 DIAGNOSIS — I080 Rheumatic disorders of both mitral and aortic valves: Secondary | ICD-10-CM | POA: Diagnosis not present

## 2022-05-12 DIAGNOSIS — G4733 Obstructive sleep apnea (adult) (pediatric): Secondary | ICD-10-CM | POA: Diagnosis not present

## 2022-05-12 DIAGNOSIS — J45909 Unspecified asthma, uncomplicated: Secondary | ICD-10-CM | POA: Diagnosis not present

## 2022-05-12 DIAGNOSIS — I2510 Atherosclerotic heart disease of native coronary artery without angina pectoris: Secondary | ICD-10-CM | POA: Diagnosis not present

## 2022-05-12 DIAGNOSIS — J189 Pneumonia, unspecified organism: Secondary | ICD-10-CM | POA: Diagnosis not present

## 2022-05-12 DIAGNOSIS — E1140 Type 2 diabetes mellitus with diabetic neuropathy, unspecified: Secondary | ICD-10-CM | POA: Diagnosis not present

## 2022-05-12 DIAGNOSIS — K219 Gastro-esophageal reflux disease without esophagitis: Secondary | ICD-10-CM | POA: Diagnosis not present

## 2022-05-12 DIAGNOSIS — J449 Chronic obstructive pulmonary disease, unspecified: Secondary | ICD-10-CM | POA: Diagnosis not present

## 2022-05-12 DIAGNOSIS — I4891 Unspecified atrial fibrillation: Secondary | ICD-10-CM | POA: Diagnosis not present

## 2022-05-13 DIAGNOSIS — I255 Ischemic cardiomyopathy: Secondary | ICD-10-CM | POA: Diagnosis present

## 2022-05-13 DIAGNOSIS — K219 Gastro-esophageal reflux disease without esophagitis: Secondary | ICD-10-CM | POA: Diagnosis present

## 2022-05-13 DIAGNOSIS — N183 Chronic kidney disease, stage 3 unspecified: Secondary | ICD-10-CM | POA: Diagnosis present

## 2022-05-13 DIAGNOSIS — I110 Hypertensive heart disease with heart failure: Secondary | ICD-10-CM | POA: Diagnosis not present

## 2022-05-13 DIAGNOSIS — D649 Anemia, unspecified: Secondary | ICD-10-CM | POA: Diagnosis present

## 2022-05-13 DIAGNOSIS — I2510 Atherosclerotic heart disease of native coronary artery without angina pectoris: Secondary | ICD-10-CM | POA: Diagnosis present

## 2022-05-13 DIAGNOSIS — J9601 Acute respiratory failure with hypoxia: Secondary | ICD-10-CM | POA: Diagnosis present

## 2022-05-13 DIAGNOSIS — J189 Pneumonia, unspecified organism: Secondary | ICD-10-CM | POA: Diagnosis present

## 2022-05-13 DIAGNOSIS — I480 Paroxysmal atrial fibrillation: Secondary | ICD-10-CM | POA: Diagnosis present

## 2022-05-13 DIAGNOSIS — Z885 Allergy status to narcotic agent status: Secondary | ICD-10-CM | POA: Diagnosis not present

## 2022-05-13 DIAGNOSIS — R918 Other nonspecific abnormal finding of lung field: Secondary | ICD-10-CM | POA: Diagnosis not present

## 2022-05-13 DIAGNOSIS — N400 Enlarged prostate without lower urinary tract symptoms: Secondary | ICD-10-CM | POA: Diagnosis present

## 2022-05-13 DIAGNOSIS — E039 Hypothyroidism, unspecified: Secondary | ICD-10-CM | POA: Diagnosis present

## 2022-05-13 DIAGNOSIS — R002 Palpitations: Secondary | ICD-10-CM | POA: Diagnosis not present

## 2022-05-13 DIAGNOSIS — Z91048 Other nonmedicinal substance allergy status: Secondary | ICD-10-CM | POA: Diagnosis not present

## 2022-05-13 DIAGNOSIS — C9000 Multiple myeloma not having achieved remission: Secondary | ICD-10-CM | POA: Diagnosis present

## 2022-05-13 DIAGNOSIS — G4733 Obstructive sleep apnea (adult) (pediatric): Secondary | ICD-10-CM | POA: Diagnosis present

## 2022-05-13 DIAGNOSIS — I5020 Unspecified systolic (congestive) heart failure: Secondary | ICD-10-CM | POA: Diagnosis present

## 2022-05-13 DIAGNOSIS — R0902 Hypoxemia: Secondary | ICD-10-CM | POA: Diagnosis not present

## 2022-05-13 DIAGNOSIS — R4182 Altered mental status, unspecified: Secondary | ICD-10-CM | POA: Diagnosis not present

## 2022-05-13 DIAGNOSIS — Z9989 Dependence on other enabling machines and devices: Secondary | ICD-10-CM | POA: Diagnosis not present

## 2022-05-13 DIAGNOSIS — J449 Chronic obstructive pulmonary disease, unspecified: Secondary | ICD-10-CM | POA: Diagnosis present

## 2022-05-13 DIAGNOSIS — R110 Nausea: Secondary | ICD-10-CM | POA: Diagnosis not present

## 2022-05-13 DIAGNOSIS — I4892 Unspecified atrial flutter: Secondary | ICD-10-CM | POA: Diagnosis present

## 2022-05-13 DIAGNOSIS — Z20822 Contact with and (suspected) exposure to covid-19: Secondary | ICD-10-CM | POA: Diagnosis not present

## 2022-05-13 DIAGNOSIS — Z1152 Encounter for screening for COVID-19: Secondary | ICD-10-CM | POA: Diagnosis not present

## 2022-05-13 DIAGNOSIS — I130 Hypertensive heart and chronic kidney disease with heart failure and stage 1 through stage 4 chronic kidney disease, or unspecified chronic kidney disease: Secondary | ICD-10-CM | POA: Diagnosis present

## 2022-05-13 DIAGNOSIS — R059 Cough, unspecified: Secondary | ICD-10-CM | POA: Diagnosis not present

## 2022-05-13 DIAGNOSIS — I509 Heart failure, unspecified: Secondary | ICD-10-CM | POA: Diagnosis not present

## 2022-05-13 DIAGNOSIS — R069 Unspecified abnormalities of breathing: Secondary | ICD-10-CM | POA: Diagnosis not present

## 2022-05-13 DIAGNOSIS — G629 Polyneuropathy, unspecified: Secondary | ICD-10-CM | POA: Diagnosis present

## 2022-05-13 DIAGNOSIS — Z87891 Personal history of nicotine dependence: Secondary | ICD-10-CM | POA: Diagnosis not present

## 2022-05-13 DIAGNOSIS — R0602 Shortness of breath: Secondary | ICD-10-CM | POA: Diagnosis not present

## 2022-05-19 ENCOUNTER — Encounter: Payer: Self-pay | Admitting: *Deleted

## 2022-05-19 ENCOUNTER — Telehealth: Payer: Self-pay | Admitting: *Deleted

## 2022-05-19 NOTE — Transitions of Care (Post Inpatient/ED Visit) (Signed)
05/19/2022  Name: [name] [name] MRN: [id number] DOB: 1941-01-09  Today's TOC FU Call Status: Today's TOC FU Call Status:: Successful TOC FU Call Competed TOC FU Call Complete Date: 05/19/22  Transition Care Management Follow-up Telephone Call Date of Discharge: 05/18/22 Discharge Facility: Other ([facility]) Name of Other (Non-[facility]) Discharge Facility: [facility] Type of Discharge: Inpatient Admission How have you been since you were released from the hospital?: Better ("I am still very weak; glad to be home from hospital, but having nausea and diarrhea, makes it hard to eat and take my medicine; I may try the probiotics as you suggest.  I have not heard from [place] yet") Any questions or concerns?: Yes Patient Questions/Concerns:: Having nausea and diarrhea; has not heard from home health agency Patient Questions/Concerns Addressed: Other: (provided education around potential benefit of adding OTC probiotics, as he reports had a "lot" of antibiotis while hospitalized; provided phone number for home health agency, as below)  Items Reviewed: Did you receive and understand the discharge instructions provided?: Yes (thoroughly reviewed with patient who verbalizes good understanding of same) Medications obtained and verified?: Yes (Medications Reviewed) (Partial medication review completed; declined full review, states lying in bed; confirmed patient obtained/ is taking all newly Rx'd medications as instructed; spouse-manages medications and denies questions/ concerns around medications today)  Verified medication changes at time of hospital discharge 05/18/22: STOP Torsemide; START Lasix 20 mg po QD x 30 days; START prednisone 15 mg QD x 30 days-- confirmed patient able to accurately verbalize all changes at time of hospital discharge, and that changes have been made/ patient taking as instructed- no other changes to medications post-hospital discharge  Diuretic therapy will  need to be clarified at end of 3-days- sent message to PCP indicating same   Any new allergies since your discharge?: No Dietary orders reviewed?: Yes Type of Diet Ordered:: "The best I can with this nausea and diarrhea" Do you have support at home?: Yes People in Home: spouse Name of Support/Comfort Primary Source: resides with supportive spouse; adult sons x 2 are currently visiting from [place] and are assisiting as needed/ indicated; patient reports he is normally independent in self-care activities- hoping to return to baseline soon  [place] and Equipment/Supplies: [place] Ordered?: Yes Name of [place]:: [place]: [phone]; provided phone number to home health agency and encouraged him to call tomorrow if he doesn't hear form agency by then Has Agency set up a time to come to your home?: No EMR reviewed for Home Health Orders: Orders present/patient has not received call (refer to CM for follow-up) Any new equipment or medical supplies ordered?: No  Functional Questionnaire: Do you need assistance with bathing/showering or dressing?: Yes (normally independent in self-care at baseline; family assisting as needed post-hospital discharge) Do you need assistance with meal preparation?: Yes (normally independent in self-care at baseline; family assisting as needed post-hospital discharge) Do you need assistance with eating?: No Do you have difficulty maintaining continence: No Do you need assistance with getting out of bed/getting out of a chair/moving?: Yes (normally independent in self-care at baseline; family assisting as needed post-hospital discharge) Do you have difficulty managing or taking your medications?: Yes (due to nausea/ diarrhea post recent hospitalization; spouse assists with medication management)  Follow up appointments reviewed: PCP Follow-up appointment confirmed?: Yes (care coordination outreach in real-time with  scheduling care guide to successfully schedule hospital follow up PCP appointment 05/27/22) Date of PCP follow-up appointment?: 05/27/22 Follow-up  Provider: PCP Specialist Hospital Follow-up appointment confirmed?: No Reason Specialist Follow-Up Not Confirmed: Patient has Specialist Provider Number and will Call for Appointment Do you need transportation to your follow-up appointment?: No Do you understand care options if your condition(s) worsen?: Yes-patient verbalized understanding  SDOH Interventions Today    Flowsheet Row Most Recent Value  SDOH Interventions   Food Insecurity Interventions Intervention Not Indicated  Transportation Interventions Intervention Not Indicated  [normally drives self,  wife providing transportation post-recent hospital discharge]      TOC Interventions Today    Flowsheet Row Most Recent Value  TOC Interventions   TOC Interventions Discussed/Reviewed TOC Interventions Discussed, Arranged PCP follow up less than 12 days/Care Guide scheduled  [provided my direct contact information should questions/ concerns/ needs arise post-TOC call, prior to RN CM telephone visit]      Interventions Today    Flowsheet Row Most Recent Value  Chronic Disease   Chronic disease during today's visit Chronic Obstructive Pulmonary Disease (COPD), Congestive Heart Failure (CHF), Other  [acute respiratory failure/ pneumonia]  General Interventions   General Interventions Discussed/Reviewed General Interventions Discussed, Doctor Visits, Referral to Nurse, Communication with, Durable Medical Equipment (DME)  Doctor Visits Discussed/Reviewed Doctor Visits Discussed, Specialist, PCP  Durable Medical Equipment (DME) Luberta Robertson,  confirmed patient using cane]  PCP/Specialist Visits Compliance with follow-up visit  Communication with RN, PCP/Specialists  Education Interventions   Education Provided Provided Education  Provided Verbal Education On Medication, When to  see the doctor, Other  [role of home health disciplines,  encouraged his active engagement/ participation]  Nutrition Interventions   Nutrition Discussed/Reviewed Nutrition Discussed  Pharmacy Interventions   Pharmacy Dicussed/Reviewed Pharmacy Topics Discussed  [provided education around potential benefit of probiotic therapy,  reviewed all medication changes post- recent hospital discharge and confirmed patient has good understanding of same and is taking as directed]  Safety Interventions   Safety Discussed/Reviewed Safety Discussed      Oneta Rack, RN, BSN, CCRN Alumnus RN CM Care Coordination/ Transition of [place] Management [phone]: direct office

## 2022-05-20 DIAGNOSIS — D509 Iron deficiency anemia, unspecified: Secondary | ICD-10-CM | POA: Diagnosis not present

## 2022-05-20 DIAGNOSIS — J439 Emphysema, unspecified: Secondary | ICD-10-CM | POA: Diagnosis not present

## 2022-05-20 DIAGNOSIS — J440 Chronic obstructive pulmonary disease with acute lower respiratory infection: Secondary | ICD-10-CM | POA: Diagnosis not present

## 2022-05-20 DIAGNOSIS — E1142 Type 2 diabetes mellitus with diabetic polyneuropathy: Secondary | ICD-10-CM | POA: Diagnosis not present

## 2022-05-20 DIAGNOSIS — M19011 Primary osteoarthritis, right shoulder: Secondary | ICD-10-CM | POA: Diagnosis not present

## 2022-05-20 DIAGNOSIS — C9001 Multiple myeloma in remission: Secondary | ICD-10-CM | POA: Diagnosis not present

## 2022-05-20 DIAGNOSIS — J189 Pneumonia, unspecified organism: Secondary | ICD-10-CM | POA: Diagnosis not present

## 2022-05-20 DIAGNOSIS — I2510 Atherosclerotic heart disease of native coronary artery without angina pectoris: Secondary | ICD-10-CM | POA: Diagnosis not present

## 2022-05-20 DIAGNOSIS — E039 Hypothyroidism, unspecified: Secondary | ICD-10-CM | POA: Diagnosis not present

## 2022-05-20 DIAGNOSIS — M47817 Spondylosis without myelopathy or radiculopathy, lumbosacral region: Secondary | ICD-10-CM | POA: Diagnosis not present

## 2022-05-20 DIAGNOSIS — M19012 Primary osteoarthritis, left shoulder: Secondary | ICD-10-CM | POA: Diagnosis not present

## 2022-05-20 DIAGNOSIS — I5021 Acute systolic (congestive) heart failure: Secondary | ICD-10-CM | POA: Diagnosis not present

## 2022-05-20 DIAGNOSIS — E1122 Type 2 diabetes mellitus with diabetic chronic kidney disease: Secondary | ICD-10-CM | POA: Diagnosis not present

## 2022-05-20 DIAGNOSIS — M5010 Cervical disc disorder with radiculopathy, unspecified cervical region: Secondary | ICD-10-CM | POA: Diagnosis not present

## 2022-05-20 DIAGNOSIS — G8929 Other chronic pain: Secondary | ICD-10-CM | POA: Diagnosis not present

## 2022-05-20 DIAGNOSIS — J470 Bronchiectasis with acute lower respiratory infection: Secondary | ICD-10-CM | POA: Diagnosis not present

## 2022-05-20 DIAGNOSIS — M48061 Spinal stenosis, lumbar region without neurogenic claudication: Secondary | ICD-10-CM | POA: Diagnosis not present

## 2022-05-20 DIAGNOSIS — I4510 Unspecified right bundle-branch block: Secondary | ICD-10-CM | POA: Diagnosis not present

## 2022-05-20 DIAGNOSIS — I130 Hypertensive heart and chronic kidney disease with heart failure and stage 1 through stage 4 chronic kidney disease, or unspecified chronic kidney disease: Secondary | ICD-10-CM | POA: Diagnosis not present

## 2022-05-20 DIAGNOSIS — I480 Paroxysmal atrial fibrillation: Secondary | ICD-10-CM | POA: Diagnosis not present

## 2022-05-20 DIAGNOSIS — M170 Bilateral primary osteoarthritis of knee: Secondary | ICD-10-CM | POA: Diagnosis not present

## 2022-05-20 DIAGNOSIS — N183 Chronic kidney disease, stage 3 unspecified: Secondary | ICD-10-CM | POA: Diagnosis not present

## 2022-05-20 DIAGNOSIS — I4892 Unspecified atrial flutter: Secondary | ICD-10-CM | POA: Diagnosis not present

## 2022-05-20 DIAGNOSIS — N400 Enlarged prostate without lower urinary tract symptoms: Secondary | ICD-10-CM | POA: Diagnosis not present

## 2022-05-20 DIAGNOSIS — M67472 Ganglion, left ankle and foot: Secondary | ICD-10-CM | POA: Diagnosis not present

## 2022-05-23 DIAGNOSIS — I5021 Acute systolic (congestive) heart failure: Secondary | ICD-10-CM | POA: Diagnosis not present

## 2022-05-23 DIAGNOSIS — J189 Pneumonia, unspecified organism: Secondary | ICD-10-CM | POA: Diagnosis not present

## 2022-05-23 DIAGNOSIS — J470 Bronchiectasis with acute lower respiratory infection: Secondary | ICD-10-CM | POA: Diagnosis not present

## 2022-05-23 DIAGNOSIS — E1122 Type 2 diabetes mellitus with diabetic chronic kidney disease: Secondary | ICD-10-CM | POA: Diagnosis not present

## 2022-05-23 DIAGNOSIS — I130 Hypertensive heart and chronic kidney disease with heart failure and stage 1 through stage 4 chronic kidney disease, or unspecified chronic kidney disease: Secondary | ICD-10-CM | POA: Diagnosis not present

## 2022-05-23 DIAGNOSIS — J440 Chronic obstructive pulmonary disease with acute lower respiratory infection: Secondary | ICD-10-CM | POA: Diagnosis not present

## 2022-05-24 DIAGNOSIS — I2510 Atherosclerotic heart disease of native coronary artery without angina pectoris: Secondary | ICD-10-CM | POA: Diagnosis not present

## 2022-05-24 DIAGNOSIS — I429 Cardiomyopathy, unspecified: Secondary | ICD-10-CM | POA: Diagnosis not present

## 2022-05-24 DIAGNOSIS — I5020 Unspecified systolic (congestive) heart failure: Secondary | ICD-10-CM | POA: Diagnosis not present

## 2022-05-25 ENCOUNTER — Ambulatory Visit: Payer: Self-pay

## 2022-05-25 NOTE — Patient Outreach (Signed)
  Care Coordination   Initial Visit Note   05/25/2022 Name: [name] [name] MRN: [id number] DOB: 02/20/1940  [name] [name] is a 82 y.o. year old male who sees [name], [name], MD for primary care. I spoke with  [name] [name] by phone today.  What matters to the patients health and wellness today?  Per review of chart: ED visit 05/02/22-05/03/22 afib with rvr [facility]. 05/11/22-05/12/22 sent to ED from cardiology office for high HR/ aflutter DCCV to SR. 05/13/22-05/18/22 admitted with acute CHF, acute respiratory failure([facility]). Had cardiology visit on 05/24/22. He reports he is doing ok except the problems with diarrhea post hospitalization. He reports it is improving/decreased frequency to once a day now.  Active with home health [facility] (out of [place]). Has an upcoming appointment with PCP on 05/27/22. He is without questions at this time.  Goals Addressed             This Visit's Progress    continue to improve post hospitalization       Interventions Today    Flowsheet Row Most Recent Value  Chronic Disease   Chronic disease during today's visit Congestive Heart Failure (CHF), Hypertension (HTN), Atrial Fibrillation (AFib)  General Interventions   General Interventions Discussed/Reviewed General Interventions Discussed, Doctor Visits  Doctor Visits Discussed/Reviewed Doctor Visits Discussed  [reviewed cardiologist patient instruction from office visit completed on 05/24/22.]  PCP/Specialist Visits Compliance with follow-up visit  Exercise Interventions   Exercise Discussed/Reviewed Exercise Discussed  [confirmed with patient that he has been contacted with home health regarding start of care. reinforced to contact home health if any scheduling concerns. verified patient has their contact number.]  Education Interventions   Education Provided Provided Education  Provided Verbal Education On Other, When to see the doctor  [discussed signs/symptoms HF exacerbation, encouraged to  contact provider with any worsening signs/symptoms.]  Pharmacy Interventions   Pharmacy Dicussed/Reviewed Pharmacy Topics Discussed, Medications and their functions, Medication Adherence, Affording Medications            SDOH assessments and interventions completed:  No  Care Coordination Interventions:  Yes, provided   Follow up plan: Follow up call scheduled for 06/09/22    Encounter Outcome:  Pt. Visit Completed   Kathyrn Sheriff, RN, MSN, BSN, CCM [facility] Care Coordinator [phone]

## 2022-05-25 NOTE — Patient Instructions (Addendum)
Visit Information  Thank you for taking time to visit with me today. Please don't hesitate to contact me if I can be of assistance to you.   Following are the goals we discussed today:   Goals Addressed             This Visit's Progress    continue to improve post hospitalization       Interventions Today    Flowsheet Row Most Recent Value  Chronic Disease   Chronic disease during today's visit Congestive Heart Failure (CHF), Hypertension (HTN), Atrial Fibrillation (AFib)  General Interventions   General Interventions Discussed/Reviewed General Interventions Discussed, Doctor Visits  Doctor Visits Discussed/Reviewed Doctor Visits Discussed  [reviewed cardiologist patient instruction from office visit completed on 05/24/22.]  PCP/Specialist Visits Compliance with follow-up visit  Exercise Interventions   Exercise Discussed/Reviewed Exercise Discussed  [confirmed with patient that he has been contacted with home health regarding start of care. reinforced to contact home health if any scheduling concerns. verified patient has their contact number.]  Education Interventions   Education Provided Provided Education  Provided Verbal Education On Other, When to see the doctor  [discussed signs/symptoms HF exacerbation, encouraged to contact provider with any worsening signs/symptoms.]  Pharmacy Interventions   Pharmacy Dicussed/Reviewed Pharmacy Topics Discussed, Medications and their functions, Medication Adherence, Affording Medications            Our next appointment is by telephone on 06/09/22 at 1:00 pm  Please call the care guide team at [phone] if you need to cancel or reschedule your appointment.   If you are experiencing a Mental Health or Behavioral Health Crisis or need someone to talk to, please call the Suicide and Crisis Lifeline: 2  Kathyrn Sheriff, RN, MSN, BSN, CCM [facility] Care Coordinator [phone]

## 2022-05-26 DIAGNOSIS — I130 Hypertensive heart and chronic kidney disease with heart failure and stage 1 through stage 4 chronic kidney disease, or unspecified chronic kidney disease: Secondary | ICD-10-CM | POA: Diagnosis not present

## 2022-05-26 DIAGNOSIS — I5021 Acute systolic (congestive) heart failure: Secondary | ICD-10-CM | POA: Diagnosis not present

## 2022-05-26 DIAGNOSIS — J440 Chronic obstructive pulmonary disease with acute lower respiratory infection: Secondary | ICD-10-CM | POA: Diagnosis not present

## 2022-05-26 DIAGNOSIS — E1122 Type 2 diabetes mellitus with diabetic chronic kidney disease: Secondary | ICD-10-CM | POA: Diagnosis not present

## 2022-05-26 DIAGNOSIS — J189 Pneumonia, unspecified organism: Secondary | ICD-10-CM | POA: Diagnosis not present

## 2022-05-26 DIAGNOSIS — J470 Bronchiectasis with acute lower respiratory infection: Secondary | ICD-10-CM | POA: Diagnosis not present

## 2022-05-27 ENCOUNTER — Ambulatory Visit (INDEPENDENT_AMBULATORY_CARE_PROVIDER_SITE_OTHER): Payer: Medicare Other

## 2022-05-27 ENCOUNTER — Encounter: Payer: Self-pay | Admitting: Sports Medicine

## 2022-05-27 ENCOUNTER — Ambulatory Visit (INDEPENDENT_AMBULATORY_CARE_PROVIDER_SITE_OTHER): Payer: Medicare Other | Admitting: Sports Medicine

## 2022-05-27 VITALS — BP 94/55 | HR 100 | Ht 72.0 in | Wt 197.0 lb

## 2022-05-27 DIAGNOSIS — J189 Pneumonia, unspecified organism: Secondary | ICD-10-CM

## 2022-05-27 DIAGNOSIS — I5022 Chronic systolic (congestive) heart failure: Secondary | ICD-10-CM

## 2022-05-27 DIAGNOSIS — I5020 Unspecified systolic (congestive) heart failure: Secondary | ICD-10-CM | POA: Insufficient documentation

## 2022-05-27 DIAGNOSIS — J439 Emphysema, unspecified: Secondary | ICD-10-CM | POA: Diagnosis not present

## 2022-05-27 DIAGNOSIS — I509 Heart failure, unspecified: Secondary | ICD-10-CM | POA: Diagnosis not present

## 2022-05-27 DIAGNOSIS — K521 Toxic gastroenteritis and colitis: Secondary | ICD-10-CM

## 2022-05-27 DIAGNOSIS — M47815 Spondylosis without myelopathy or radiculopathy, thoracolumbar region: Secondary | ICD-10-CM

## 2022-05-27 DIAGNOSIS — Z0389 Encounter for observation for other suspected diseases and conditions ruled out: Secondary | ICD-10-CM | POA: Diagnosis not present

## 2022-05-27 DIAGNOSIS — G6289 Other specified polyneuropathies: Secondary | ICD-10-CM | POA: Diagnosis not present

## 2022-05-27 DIAGNOSIS — T3695XA Adverse effect of unspecified systemic antibiotic, initial encounter: Secondary | ICD-10-CM | POA: Diagnosis not present

## 2022-05-27 DIAGNOSIS — R059 Cough, unspecified: Secondary | ICD-10-CM | POA: Diagnosis not present

## 2022-05-27 MED ORDER — OXYCODONE-ACETAMINOPHEN 10-325 MG PO TABS
ORAL_TABLET | ORAL | 0 refills | Status: DC
Start: 2022-05-27 — End: 2022-07-18

## 2022-05-27 MED ORDER — GABAPENTIN 800 MG PO TABS: 800.0000 mg | ORAL_TABLET | Freq: Three times a day (TID) | ORAL | 3 refills | Status: DC

## 2022-05-27 MED ORDER — XTAMPZA ER 9 MG PO C12A
1.0000 | EXTENDED_RELEASE_CAPSULE | Freq: Two times a day (BID) | ORAL | 0 refills | Status: DC
Start: 2022-05-27 — End: 2022-07-18

## 2022-05-27 NOTE — Assessment & Plan Note (Signed)
Increasing gabapentin to 800 mg 3 times daily.

## 2022-05-27 NOTE — Progress Notes (Signed)
    Procedures performed today:    None.  Independent interpretation of notes and tests performed by another provider:   None.  Brief History, Exam, Impression, and Recommendations:    Systolic congestive heart failure Recently hospitalized with acute on chronic systolic heart failure, ejection fraction about 40 to 45%. He is currently about at his dry weight, euvolemic on exam. We will watch this for now.  Antibiotic-associated diarrhea Antibiotics in the hospital for pneumonia, now having diarrhea. Belly is soft, somewhat tympanic to percussion in the mid epigastrium though. Also having some nausea. Suspect he is having an ileus, adding C. difficile testing, abdominal x-rays, I have encouraged him to ambulate is much as he can.  Community acquired pneumonia Recently hospitalized for community-acquired pneumonia with heart failure exacerbation, treated, recovered, he is still coughing, I explained him this was normal, lungs are clear, adding repeat chest x-ray.   Peripheral neuropathy Increasing gabapentin to 800 mg 3 times daily.    ____________________________________________ [name]. Benjamin Stain, M.D., ABFM., CAQSM., AME. Primary Care and Sports Medicine [place] [facility]  Adjunct Professor of Family Medicine  [place] of [facility] of Medicine  Restaurant manager, fast food

## 2022-05-27 NOTE — Assessment & Plan Note (Signed)
Antibiotics in the hospital for pneumonia, now having diarrhea. Belly is soft, somewhat tympanic to percussion in the mid epigastrium though. Also having some nausea. Suspect he is having an ileus, adding C. difficile testing, abdominal x-rays, I have encouraged him to ambulate is much as he can.

## 2022-05-27 NOTE — Assessment & Plan Note (Signed)
Recently hospitalized for community-acquired pneumonia with heart failure exacerbation, treated, recovered, he is still coughing, I explained him this was normal, lungs are clear, adding repeat chest x-ray.

## 2022-05-27 NOTE — Assessment & Plan Note (Signed)
Recently hospitalized with acute on chronic systolic heart failure, ejection fraction about 40 to 45%. He is currently about at his dry weight, euvolemic on exam. We will watch this for now.

## 2022-05-28 LAB — COMPREHENSIVE METABOLIC PANEL
AG Ratio: 2 (calc) (ref 1.0–2.5)
ALT: 73 U/L — ABNORMAL HIGH (ref 9–46)
AST: 33 U/L (ref 10–35)
Albumin: 3.8 g/dL (ref 3.6–5.1)
Alkaline phosphatase (APISO): 117 U/L (ref 35–144)
BUN/Creatinine Ratio: 20 (calc) (ref 6–22)
BUN: 27 mg/dL — ABNORMAL HIGH (ref 7–25)
CO2: 29 mmol/L (ref 20–32)
Calcium: 8.8 mg/dL (ref 8.6–10.3)
Chloride: 103 mmol/L (ref 98–110)
Creat: 1.32 mg/dL — ABNORMAL HIGH (ref 0.70–1.22)
Globulin: 1.9 g/dL (calc) (ref 1.9–3.7)
Glucose, Bld: 101 mg/dL — ABNORMAL HIGH (ref 65–99)
Potassium: 4.9 mmol/L (ref 3.5–5.3)
Sodium: 143 mmol/L (ref 135–146)
Total Bilirubin: 0.4 mg/dL (ref 0.2–1.2)
Total Protein: 5.7 g/dL — ABNORMAL LOW (ref 6.1–8.1)

## 2022-05-28 LAB — CBC
HCT: 43.2 % (ref 38.5–50.0)
Hemoglobin: 13.6 g/dL (ref 13.2–17.1)
MCH: 26.7 pg — ABNORMAL LOW (ref 27.0–33.0)
MCHC: 31.5 g/dL — ABNORMAL LOW (ref 32.0–36.0)
MCV: 84.9 fL (ref 80.0–100.0)
MPV: 11.2 fL (ref 7.5–12.5)
Platelets: 303 10*3/uL (ref 140–400)
RBC: 5.09 10*6/uL (ref 4.20–5.80)
RDW: 14.5 % (ref 11.0–15.0)
WBC: 6.9 10*3/uL (ref 3.8–10.8)

## 2022-05-30 DIAGNOSIS — E1122 Type 2 diabetes mellitus with diabetic chronic kidney disease: Secondary | ICD-10-CM | POA: Diagnosis not present

## 2022-05-30 DIAGNOSIS — J470 Bronchiectasis with acute lower respiratory infection: Secondary | ICD-10-CM | POA: Diagnosis not present

## 2022-05-30 DIAGNOSIS — I5021 Acute systolic (congestive) heart failure: Secondary | ICD-10-CM | POA: Diagnosis not present

## 2022-05-30 DIAGNOSIS — I130 Hypertensive heart and chronic kidney disease with heart failure and stage 1 through stage 4 chronic kidney disease, or unspecified chronic kidney disease: Secondary | ICD-10-CM | POA: Diagnosis not present

## 2022-05-30 DIAGNOSIS — J189 Pneumonia, unspecified organism: Secondary | ICD-10-CM | POA: Diagnosis not present

## 2022-05-30 DIAGNOSIS — T3695XA Adverse effect of unspecified systemic antibiotic, initial encounter: Secondary | ICD-10-CM | POA: Diagnosis not present

## 2022-05-30 DIAGNOSIS — J440 Chronic obstructive pulmonary disease with acute lower respiratory infection: Secondary | ICD-10-CM | POA: Diagnosis not present

## 2022-05-30 DIAGNOSIS — K521 Toxic gastroenteritis and colitis: Secondary | ICD-10-CM | POA: Diagnosis not present

## 2022-05-31 LAB — C. DIFFICILE GDH AND TOXIN A/B
GDH ANTIGEN: NOT DETECTED
MICRO NUMBER:: 14827406
SPECIMEN QUALITY:: ADEQUATE
TOXIN A AND B: NOT DETECTED

## 2022-06-02 ENCOUNTER — Inpatient Hospital Stay: Payer: Medicare Other | Admitting: Sports Medicine

## 2022-06-03 ENCOUNTER — Other Ambulatory Visit: Payer: Self-pay | Admitting: Sports Medicine

## 2022-06-06 DIAGNOSIS — J189 Pneumonia, unspecified organism: Secondary | ICD-10-CM | POA: Diagnosis not present

## 2022-06-06 DIAGNOSIS — J470 Bronchiectasis with acute lower respiratory infection: Secondary | ICD-10-CM | POA: Diagnosis not present

## 2022-06-06 DIAGNOSIS — I5021 Acute systolic (congestive) heart failure: Secondary | ICD-10-CM | POA: Diagnosis not present

## 2022-06-06 DIAGNOSIS — I130 Hypertensive heart and chronic kidney disease with heart failure and stage 1 through stage 4 chronic kidney disease, or unspecified chronic kidney disease: Secondary | ICD-10-CM | POA: Diagnosis not present

## 2022-06-06 DIAGNOSIS — J440 Chronic obstructive pulmonary disease with acute lower respiratory infection: Secondary | ICD-10-CM | POA: Diagnosis not present

## 2022-06-06 DIAGNOSIS — E1122 Type 2 diabetes mellitus with diabetic chronic kidney disease: Secondary | ICD-10-CM | POA: Diagnosis not present

## 2022-06-07 DIAGNOSIS — Z7901 Long term (current) use of anticoagulants: Secondary | ICD-10-CM | POA: Diagnosis not present

## 2022-06-07 DIAGNOSIS — C9002 Multiple myeloma in relapse: Secondary | ICD-10-CM | POA: Diagnosis not present

## 2022-06-07 DIAGNOSIS — R000 Tachycardia, unspecified: Secondary | ICD-10-CM | POA: Diagnosis not present

## 2022-06-07 DIAGNOSIS — I951 Orthostatic hypotension: Secondary | ICD-10-CM | POA: Diagnosis not present

## 2022-06-07 DIAGNOSIS — Z5111 Encounter for antineoplastic chemotherapy: Secondary | ICD-10-CM | POA: Diagnosis not present

## 2022-06-07 DIAGNOSIS — J189 Pneumonia, unspecified organism: Secondary | ICD-10-CM | POA: Diagnosis not present

## 2022-06-07 DIAGNOSIS — M899 Disorder of bone, unspecified: Secondary | ICD-10-CM | POA: Diagnosis not present

## 2022-06-07 DIAGNOSIS — E860 Dehydration: Secondary | ICD-10-CM | POA: Diagnosis not present

## 2022-06-07 DIAGNOSIS — C9001 Multiple myeloma in remission: Secondary | ICD-10-CM | POA: Diagnosis not present

## 2022-06-07 DIAGNOSIS — Z9484 Stem cells transplant status: Secondary | ICD-10-CM | POA: Diagnosis not present

## 2022-06-08 ENCOUNTER — Other Ambulatory Visit: Payer: Self-pay | Admitting: Sports Medicine

## 2022-06-08 DIAGNOSIS — M47815 Spondylosis without myelopathy or radiculopathy, thoracolumbar region: Secondary | ICD-10-CM

## 2022-06-09 ENCOUNTER — Ambulatory Visit: Payer: Self-pay

## 2022-06-09 ENCOUNTER — Encounter: Payer: Self-pay | Admitting: Sports Medicine

## 2022-06-09 MED ORDER — GABAPENTIN 800 MG PO TABS: 800.0000 mg | ORAL_TABLET | Freq: Three times a day (TID) | ORAL | 3 refills | Status: DC

## 2022-06-09 NOTE — Patient Outreach (Signed)
  Care Coordination   Follow Up Visit Note   06/09/2022 Name: [name] [name] MRN: [id number] DOB: October 25, 1940  [name] [name] is a 82 y.o. year old male who sees [name], [name], MD for primary care. I spoke with  [name] [name] by phone today.  What matters to the patients health and wellness today?  [name] reprots he is doing well. Saw PCP on 05/27/22 and oncologist 06/07/22. He expressed that he has to start his medications for multiple myeloma all over again because he went too long between treatments during the time he was in the hospital, but states he will do what he has to do. Adds multiple myeloma is in remission. He reports he is not having any signs/symptoms of HF exacerbation. He denies any questions or concerns at this time. Medications reviewed with patient. Patient reports most of his providers are with [facility]. Patient is receptive to clinical pharmacy referral for medication reconciliation.  Goals Addressed             This Visit's Progress    continue to improve post hospitalization       Interventions Today    Flowsheet Row Most Recent Value  Chronic Disease   Chronic disease during today's visit Congestive Heart Failure (CHF), Other  [multiple myoloma in remission]  General Interventions   General Interventions Discussed/Reviewed General Interventions Reviewed  Doctor Visits Discussed/Reviewed Doctor Visits Reviewed, PCP  PCP/Specialist Visits Compliance with follow-up visit  [confirmed patient followed up with PCP 06/26/22 and hem/onc provider 06/07/22]  Education Interventions   Education Provided Provided Education  [encouraged to continue to attend provider visit as scheduled, encouraged to continue to take medications as prescribed. reviewed signs/symptoms of HF exacerbation]  Nutrition Interventions   Nutrition Discussed/Reviewed Nutrition Reviewed  Pharmacy Interventions   Pharmacy Dicussed/Reviewed Pharmacy Topics Reviewed, Medications and their  functions, Medication Adherence, Referral to Pharmacist  Referral to Pharmacist --  [medication reconciliation. Most of providers seen at [facility].]            SDOH assessments and interventions completed:  No  Care Coordination Interventions:  Yes, provided   Follow up plan: Follow up call scheduled for 07/08/22    Encounter Outcome:  Pt. Visit Completed   [name] Sheriff, RN, MSN, BSN, CCM [facility] Care Coordinator [phone]

## 2022-06-10 ENCOUNTER — Other Ambulatory Visit: Payer: Self-pay | Admitting: Sports Medicine

## 2022-06-10 DIAGNOSIS — I10 Essential (primary) hypertension: Secondary | ICD-10-CM

## 2022-06-10 DIAGNOSIS — Z79899 Other long term (current) drug therapy: Secondary | ICD-10-CM

## 2022-06-13 ENCOUNTER — Telehealth: Payer: Self-pay

## 2022-06-13 NOTE — Progress Notes (Signed)
   Care Guide Note  06/13/2022 Name: [name] MRN: [id number] DOB: 03-Jul-1940  Referred by: Monica Becton, MD Reason for referral : Care Coordination (Outreach to schedule with pharm d )   [name] [name] is a 82 y.o. year old male who is a primary care patient of Benjamin Stain, [name], MD. Cristin Szatkowski was referred to the pharmacist for assistance related to CHF.    Successful contact was made with the patient to discuss pharmacy services including being ready for the pharmacist to call at least 5 minutes before the scheduled appointment time, to have medication bottles and any blood sugar or blood pressure readings ready for review. The patient agreed to meet with the pharmacist via with the pharmacist via telephone visit on (date/time).  06/15/2022  Penne Lash, RMA Care Guide [facility]  [place], [place] [address] Direct Dial: [phone] [email or web address]

## 2022-06-14 DIAGNOSIS — M5010 Cervical disc disorder with radiculopathy, unspecified cervical region: Secondary | ICD-10-CM | POA: Diagnosis not present

## 2022-06-14 DIAGNOSIS — J189 Pneumonia, unspecified organism: Secondary | ICD-10-CM | POA: Diagnosis not present

## 2022-06-14 DIAGNOSIS — I5021 Acute systolic (congestive) heart failure: Secondary | ICD-10-CM | POA: Diagnosis not present

## 2022-06-14 DIAGNOSIS — Z6827 Body mass index (BMI) 27.0-27.9, adult: Secondary | ICD-10-CM | POA: Diagnosis not present

## 2022-06-14 DIAGNOSIS — N1831 Chronic kidney disease, stage 3a: Secondary | ICD-10-CM | POA: Diagnosis not present

## 2022-06-14 DIAGNOSIS — G8929 Other chronic pain: Secondary | ICD-10-CM | POA: Diagnosis not present

## 2022-06-14 DIAGNOSIS — I480 Paroxysmal atrial fibrillation: Secondary | ICD-10-CM | POA: Diagnosis present

## 2022-06-14 DIAGNOSIS — R530 Neoplastic (malignant) related fatigue: Secondary | ICD-10-CM | POA: Diagnosis present

## 2022-06-14 DIAGNOSIS — I4510 Unspecified right bundle-branch block: Secondary | ICD-10-CM | POA: Diagnosis not present

## 2022-06-14 DIAGNOSIS — Z7901 Long term (current) use of anticoagulants: Secondary | ICD-10-CM | POA: Diagnosis not present

## 2022-06-14 DIAGNOSIS — N183 Chronic kidney disease, stage 3 unspecified: Secondary | ICD-10-CM | POA: Diagnosis not present

## 2022-06-14 DIAGNOSIS — E1122 Type 2 diabetes mellitus with diabetic chronic kidney disease: Secondary | ICD-10-CM | POA: Diagnosis present

## 2022-06-14 DIAGNOSIS — E663 Overweight: Secondary | ICD-10-CM | POA: Diagnosis present

## 2022-06-14 DIAGNOSIS — I959 Hypotension, unspecified: Secondary | ICD-10-CM | POA: Diagnosis present

## 2022-06-14 DIAGNOSIS — J440 Chronic obstructive pulmonary disease with acute lower respiratory infection: Secondary | ICD-10-CM | POA: Diagnosis not present

## 2022-06-14 DIAGNOSIS — I255 Ischemic cardiomyopathy: Secondary | ICD-10-CM | POA: Diagnosis not present

## 2022-06-14 DIAGNOSIS — I5020 Unspecified systolic (congestive) heart failure: Secondary | ICD-10-CM | POA: Diagnosis present

## 2022-06-14 DIAGNOSIS — I130 Hypertensive heart and chronic kidney disease with heart failure and stage 1 through stage 4 chronic kidney disease, or unspecified chronic kidney disease: Secondary | ICD-10-CM | POA: Diagnosis present

## 2022-06-14 DIAGNOSIS — I7090 Unspecified atherosclerosis: Secondary | ICD-10-CM | POA: Diagnosis not present

## 2022-06-14 DIAGNOSIS — R7881 Bacteremia: Secondary | ICD-10-CM | POA: Diagnosis not present

## 2022-06-14 DIAGNOSIS — B965 Pseudomonas (aeruginosa) (mallei) (pseudomallei) as the cause of diseases classified elsewhere: Secondary | ICD-10-CM | POA: Diagnosis present

## 2022-06-14 DIAGNOSIS — K219 Gastro-esophageal reflux disease without esophagitis: Secondary | ICD-10-CM | POA: Diagnosis present

## 2022-06-14 DIAGNOSIS — N400 Enlarged prostate without lower urinary tract symptoms: Secondary | ICD-10-CM | POA: Diagnosis present

## 2022-06-14 DIAGNOSIS — I5189 Other ill-defined heart diseases: Secondary | ICD-10-CM | POA: Diagnosis not present

## 2022-06-14 DIAGNOSIS — I10 Essential (primary) hypertension: Secondary | ICD-10-CM | POA: Diagnosis not present

## 2022-06-14 DIAGNOSIS — R918 Other nonspecific abnormal finding of lung field: Secondary | ICD-10-CM | POA: Diagnosis not present

## 2022-06-14 DIAGNOSIS — E039 Hypothyroidism, unspecified: Secondary | ICD-10-CM | POA: Diagnosis present

## 2022-06-14 DIAGNOSIS — G4733 Obstructive sleep apnea (adult) (pediatric): Secondary | ICD-10-CM | POA: Diagnosis present

## 2022-06-14 DIAGNOSIS — I4892 Unspecified atrial flutter: Secondary | ICD-10-CM | POA: Diagnosis present

## 2022-06-14 DIAGNOSIS — M19011 Primary osteoarthritis, right shoulder: Secondary | ICD-10-CM | POA: Diagnosis not present

## 2022-06-14 DIAGNOSIS — J8489 Other specified interstitial pulmonary diseases: Secondary | ICD-10-CM | POA: Diagnosis not present

## 2022-06-14 DIAGNOSIS — M67472 Ganglion, left ankle and foot: Secondary | ICD-10-CM | POA: Diagnosis not present

## 2022-06-14 DIAGNOSIS — N281 Cyst of kidney, acquired: Secondary | ICD-10-CM | POA: Diagnosis not present

## 2022-06-14 DIAGNOSIS — D509 Iron deficiency anemia, unspecified: Secondary | ICD-10-CM | POA: Diagnosis not present

## 2022-06-14 DIAGNOSIS — M19012 Primary osteoarthritis, left shoulder: Secondary | ICD-10-CM | POA: Diagnosis not present

## 2022-06-14 DIAGNOSIS — J439 Emphysema, unspecified: Secondary | ICD-10-CM | POA: Diagnosis not present

## 2022-06-14 DIAGNOSIS — J479 Bronchiectasis, uncomplicated: Secondary | ICD-10-CM | POA: Diagnosis not present

## 2022-06-14 DIAGNOSIS — C9001 Multiple myeloma in remission: Secondary | ICD-10-CM | POA: Diagnosis not present

## 2022-06-14 DIAGNOSIS — M170 Bilateral primary osteoarthritis of knee: Secondary | ICD-10-CM | POA: Diagnosis not present

## 2022-06-14 DIAGNOSIS — J449 Chronic obstructive pulmonary disease, unspecified: Secondary | ICD-10-CM | POA: Diagnosis present

## 2022-06-14 DIAGNOSIS — I517 Cardiomegaly: Secondary | ICD-10-CM | POA: Diagnosis not present

## 2022-06-14 DIAGNOSIS — I351 Nonrheumatic aortic (valve) insufficiency: Secondary | ICD-10-CM | POA: Diagnosis not present

## 2022-06-14 DIAGNOSIS — C9000 Multiple myeloma not having achieved remission: Secondary | ICD-10-CM | POA: Diagnosis present

## 2022-06-14 DIAGNOSIS — N179 Acute kidney failure, unspecified: Secondary | ICD-10-CM | POA: Diagnosis present

## 2022-06-14 DIAGNOSIS — D649 Anemia, unspecified: Secondary | ICD-10-CM | POA: Diagnosis present

## 2022-06-14 DIAGNOSIS — R197 Diarrhea, unspecified: Secondary | ICD-10-CM | POA: Diagnosis present

## 2022-06-14 DIAGNOSIS — D72823 Leukemoid reaction: Secondary | ICD-10-CM | POA: Diagnosis not present

## 2022-06-14 DIAGNOSIS — C9002 Multiple myeloma in relapse: Secondary | ICD-10-CM | POA: Diagnosis not present

## 2022-06-14 DIAGNOSIS — M48061 Spinal stenosis, lumbar region without neurogenic claudication: Secondary | ICD-10-CM | POA: Diagnosis not present

## 2022-06-14 DIAGNOSIS — I2510 Atherosclerotic heart disease of native coronary artery without angina pectoris: Secondary | ICD-10-CM | POA: Diagnosis not present

## 2022-06-14 DIAGNOSIS — J470 Bronchiectasis with acute lower respiratory infection: Secondary | ICD-10-CM | POA: Diagnosis not present

## 2022-06-14 DIAGNOSIS — M47817 Spondylosis without myelopathy or radiculopathy, lumbosacral region: Secondary | ICD-10-CM | POA: Diagnosis not present

## 2022-06-14 DIAGNOSIS — R700 Elevated erythrocyte sedimentation rate: Secondary | ICD-10-CM | POA: Diagnosis not present

## 2022-06-14 DIAGNOSIS — E119 Type 2 diabetes mellitus without complications: Secondary | ICD-10-CM | POA: Diagnosis not present

## 2022-06-14 DIAGNOSIS — Z66 Do not resuscitate: Secondary | ICD-10-CM | POA: Diagnosis present

## 2022-06-14 DIAGNOSIS — E1142 Type 2 diabetes mellitus with diabetic polyneuropathy: Secondary | ICD-10-CM | POA: Diagnosis not present

## 2022-06-14 DIAGNOSIS — E860 Dehydration: Secondary | ICD-10-CM | POA: Diagnosis present

## 2022-06-14 DIAGNOSIS — K449 Diaphragmatic hernia without obstruction or gangrene: Secondary | ICD-10-CM | POA: Diagnosis not present

## 2022-06-15 ENCOUNTER — Telehealth: Payer: Self-pay | Admitting: Pharmacist

## 2022-06-15 ENCOUNTER — Other Ambulatory Visit: Payer: Medicare Other | Admitting: Pharmacist

## 2022-06-15 DIAGNOSIS — D649 Anemia, unspecified: Secondary | ICD-10-CM | POA: Diagnosis present

## 2022-06-15 DIAGNOSIS — R197 Diarrhea, unspecified: Secondary | ICD-10-CM | POA: Diagnosis present

## 2022-06-15 DIAGNOSIS — M19012 Primary osteoarthritis, left shoulder: Secondary | ICD-10-CM | POA: Diagnosis not present

## 2022-06-15 DIAGNOSIS — I959 Hypotension, unspecified: Secondary | ICD-10-CM | POA: Diagnosis present

## 2022-06-15 DIAGNOSIS — J479 Bronchiectasis, uncomplicated: Secondary | ICD-10-CM | POA: Diagnosis not present

## 2022-06-15 DIAGNOSIS — M67472 Ganglion, left ankle and foot: Secondary | ICD-10-CM | POA: Diagnosis not present

## 2022-06-15 DIAGNOSIS — M170 Bilateral primary osteoarthritis of knee: Secondary | ICD-10-CM | POA: Diagnosis not present

## 2022-06-15 DIAGNOSIS — E119 Type 2 diabetes mellitus without complications: Secondary | ICD-10-CM | POA: Diagnosis not present

## 2022-06-15 DIAGNOSIS — I517 Cardiomegaly: Secondary | ICD-10-CM | POA: Diagnosis not present

## 2022-06-15 DIAGNOSIS — J440 Chronic obstructive pulmonary disease with acute lower respiratory infection: Secondary | ICD-10-CM | POA: Diagnosis not present

## 2022-06-15 DIAGNOSIS — D72823 Leukemoid reaction: Secondary | ICD-10-CM | POA: Diagnosis not present

## 2022-06-15 DIAGNOSIS — B965 Pseudomonas (aeruginosa) (mallei) (pseudomallei) as the cause of diseases classified elsewhere: Secondary | ICD-10-CM | POA: Diagnosis present

## 2022-06-15 DIAGNOSIS — R7881 Bacteremia: Secondary | ICD-10-CM | POA: Diagnosis present

## 2022-06-15 DIAGNOSIS — E1142 Type 2 diabetes mellitus with diabetic polyneuropathy: Secondary | ICD-10-CM | POA: Diagnosis not present

## 2022-06-15 DIAGNOSIS — I4892 Unspecified atrial flutter: Secondary | ICD-10-CM | POA: Diagnosis not present

## 2022-06-15 DIAGNOSIS — R700 Elevated erythrocyte sedimentation rate: Secondary | ICD-10-CM | POA: Diagnosis not present

## 2022-06-15 DIAGNOSIS — I130 Hypertensive heart and chronic kidney disease with heart failure and stage 1 through stage 4 chronic kidney disease, or unspecified chronic kidney disease: Secondary | ICD-10-CM | POA: Diagnosis not present

## 2022-06-15 DIAGNOSIS — I2510 Atherosclerotic heart disease of native coronary artery without angina pectoris: Secondary | ICD-10-CM | POA: Diagnosis not present

## 2022-06-15 DIAGNOSIS — J439 Emphysema, unspecified: Secondary | ICD-10-CM | POA: Diagnosis not present

## 2022-06-15 DIAGNOSIS — N400 Enlarged prostate without lower urinary tract symptoms: Secondary | ICD-10-CM | POA: Diagnosis not present

## 2022-06-15 DIAGNOSIS — C9000 Multiple myeloma not having achieved remission: Secondary | ICD-10-CM | POA: Diagnosis present

## 2022-06-15 DIAGNOSIS — M48061 Spinal stenosis, lumbar region without neurogenic claudication: Secondary | ICD-10-CM | POA: Diagnosis not present

## 2022-06-15 DIAGNOSIS — C9001 Multiple myeloma in remission: Secondary | ICD-10-CM | POA: Diagnosis not present

## 2022-06-15 DIAGNOSIS — E663 Overweight: Secondary | ICD-10-CM | POA: Diagnosis present

## 2022-06-15 DIAGNOSIS — Z66 Do not resuscitate: Secondary | ICD-10-CM | POA: Diagnosis present

## 2022-06-15 DIAGNOSIS — D509 Iron deficiency anemia, unspecified: Secondary | ICD-10-CM | POA: Diagnosis not present

## 2022-06-15 DIAGNOSIS — N1831 Chronic kidney disease, stage 3a: Secondary | ICD-10-CM | POA: Diagnosis present

## 2022-06-15 DIAGNOSIS — J189 Pneumonia, unspecified organism: Secondary | ICD-10-CM | POA: Diagnosis not present

## 2022-06-15 DIAGNOSIS — N183 Chronic kidney disease, stage 3 unspecified: Secondary | ICD-10-CM | POA: Diagnosis not present

## 2022-06-15 DIAGNOSIS — Z6827 Body mass index (BMI) 27.0-27.9, adult: Secondary | ICD-10-CM | POA: Diagnosis not present

## 2022-06-15 DIAGNOSIS — I5189 Other ill-defined heart diseases: Secondary | ICD-10-CM | POA: Diagnosis not present

## 2022-06-15 DIAGNOSIS — K219 Gastro-esophageal reflux disease without esophagitis: Secondary | ICD-10-CM | POA: Diagnosis present

## 2022-06-15 DIAGNOSIS — I351 Nonrheumatic aortic (valve) insufficiency: Secondary | ICD-10-CM | POA: Diagnosis not present

## 2022-06-15 DIAGNOSIS — M47817 Spondylosis without myelopathy or radiculopathy, lumbosacral region: Secondary | ICD-10-CM | POA: Diagnosis not present

## 2022-06-15 DIAGNOSIS — I4510 Unspecified right bundle-branch block: Secondary | ICD-10-CM | POA: Diagnosis not present

## 2022-06-15 DIAGNOSIS — J470 Bronchiectasis with acute lower respiratory infection: Secondary | ICD-10-CM | POA: Diagnosis not present

## 2022-06-15 DIAGNOSIS — N179 Acute kidney failure, unspecified: Secondary | ICD-10-CM | POA: Diagnosis present

## 2022-06-15 DIAGNOSIS — R530 Neoplastic (malignant) related fatigue: Secondary | ICD-10-CM | POA: Diagnosis present

## 2022-06-15 DIAGNOSIS — E860 Dehydration: Secondary | ICD-10-CM | POA: Diagnosis present

## 2022-06-15 DIAGNOSIS — I5020 Unspecified systolic (congestive) heart failure: Secondary | ICD-10-CM | POA: Diagnosis present

## 2022-06-15 DIAGNOSIS — E1122 Type 2 diabetes mellitus with diabetic chronic kidney disease: Secondary | ICD-10-CM | POA: Diagnosis not present

## 2022-06-15 DIAGNOSIS — I7090 Unspecified atherosclerosis: Secondary | ICD-10-CM | POA: Diagnosis not present

## 2022-06-15 DIAGNOSIS — I480 Paroxysmal atrial fibrillation: Secondary | ICD-10-CM | POA: Diagnosis not present

## 2022-06-15 DIAGNOSIS — N281 Cyst of kidney, acquired: Secondary | ICD-10-CM | POA: Diagnosis not present

## 2022-06-15 DIAGNOSIS — J449 Chronic obstructive pulmonary disease, unspecified: Secondary | ICD-10-CM | POA: Diagnosis present

## 2022-06-15 DIAGNOSIS — M5010 Cervical disc disorder with radiculopathy, unspecified cervical region: Secondary | ICD-10-CM | POA: Diagnosis not present

## 2022-06-15 DIAGNOSIS — G8929 Other chronic pain: Secondary | ICD-10-CM | POA: Diagnosis not present

## 2022-06-15 DIAGNOSIS — K449 Diaphragmatic hernia without obstruction or gangrene: Secondary | ICD-10-CM | POA: Diagnosis not present

## 2022-06-15 DIAGNOSIS — G4733 Obstructive sleep apnea (adult) (pediatric): Secondary | ICD-10-CM | POA: Diagnosis present

## 2022-06-15 DIAGNOSIS — R918 Other nonspecific abnormal finding of lung field: Secondary | ICD-10-CM | POA: Diagnosis not present

## 2022-06-15 DIAGNOSIS — E039 Hypothyroidism, unspecified: Secondary | ICD-10-CM | POA: Diagnosis not present

## 2022-06-15 DIAGNOSIS — M19011 Primary osteoarthritis, right shoulder: Secondary | ICD-10-CM | POA: Diagnosis not present

## 2022-06-15 DIAGNOSIS — I5021 Acute systolic (congestive) heart failure: Secondary | ICD-10-CM | POA: Diagnosis not present

## 2022-06-15 DIAGNOSIS — I255 Ischemic cardiomyopathy: Secondary | ICD-10-CM | POA: Diagnosis present

## 2022-06-15 NOTE — Progress Notes (Unsigned)
06/15/2022 Name: [name] [name] MRN: [id number] DOB: February 20, 1940  No chief complaint on file.   {Visit Type:26650}  Patient is participating in a Managed Medicaid Plan:  {MM YES/NO:27447::"Yes"}  Subjective:  Care Team: Primary Care Provider: Monica Becton, MD ; Next Scheduled Visit: *** {careteamprovider:27366}  Medication Access/Adherence  Current Pharmacy:  [facility] [address] [place], [place] - [address] [facility] [address] [place] [address] Phone: [phone] Fax: [phone]  [facility] - [place], [place] - [address] [place] [address] Phone: [phone] Fax: [phone]  [facility] - [place], [place] - [address] [facility] [address] [place] [address] Phone: [phone] Fax: [phone]   Patient reports affordability concerns with their medications: {YES/NO:21197} Patient reports access/transportation concerns to their pharmacy: {YES/NO:21197} Patient reports adherence concerns with their medications:  {YES/NO:21197} ***   {Pharmacy S/O Choices:26420}   Objective:  Lab Results  Component Value Date   HGBA1C 6.3 (H) 10/14/2020    Lab Results  Component Value Date   CREATININE 1.32 (H) 05/27/2022   BUN 27 (H) 05/27/2022   NA 143 05/27/2022   K 4.9 05/27/2022   CL 103 05/27/2022   CO2 29 05/27/2022    Lab Results  Component Value Date   CHOL 216 (H) 10/14/2020   HDL 49 10/14/2020   LDLCALC 136 (H) 10/14/2020   TRIG 175 (H) 10/14/2020   CHOLHDL 4.4 10/14/2020    Medications Reviewed Today     Reviewed by Colletta Maryland, RN (Registered Nurse) on 06/09/22 at 1327  Med List Status: <None>   Medication Order Taking? Sig Documenting Provider Last Dose Status Informant  amiodarone (PACERONE) 200 MG tablet [id number] Yes Take 200 mg by mouth daily. Monica Becton, MD Taking Active Self            Med Note Michaela Corner   Fri Jan 21, 2022 11:01 AM) 01/21/22- patient reports prescribed by [facility] at time of hospital discharge on 01/19/22  amphetamine-dextroamphetamine (ADDERALL XR) 10 MG 24 hr capsule [id number] No Take 10 mg by mouth daily.  Patient not taking: Reported on 06/09/2022   [provider] Not Taking Active   amphetamine-dextroamphetamine (ADDERALL XR) 20 MG 24 hr capsule [id number] No Take 1 capsule (20 mg total) by mouth daily.  Patient not taking: Reported on 06/09/2022   Monica Becton, MD Not Taking Active   cetirizine (ZYRTEC) 10 MG tablet [id number] Yes TAKE 1 TABLET EVERY DAY Monica Becton, MD Taking Active   Cyanocobalamin 1000 MCG TBCR [id number] Yes Take 1 tablet by mouth daily.  [provider] Taking Active            Med Note Juanda Crumble Apr 23, 2015  1:08 PM) Received from: [facility]  empagliflozin (JARDIANCE) 10 MG TABS tablet [id number] Yes Take by mouth daily. [provider] Taking Active   esomeprazole (NEXIUM) 40 MG capsule [id number] Yes TAKE 1 CAPSULE EVERY DAY AT 12:00 PM Monica Becton, MD Taking Active   Evolocumab [facility] SURECLICK) 140 MG/ML Ivory Broad [id number] Yes Inject 140 mg into the skin every 14 (fourteen) days. Monica Becton, MD Taking Active   famciclovir [facility]) 500 MG tablet [id number] Yes TAKE 1 TABLET EVERY DAY Ennever, Rose Phi, MD Taking Active            Med Note Golf, [place] M  Thu Jun 09, 2022  1:20 PM) Reports takes 500 mg twice a day  finasteride (PROSCAR) 5 MG tablet [id number] Yes Take 5 mg by mouth daily. [provider] Taking Active Self  fluticasone (FLONASE) 50 MCG/ACT nasal spray [id number] Yes Place 2 sprays into both nostrils daily. Monica Becton, MD Taking Active            Med Note Earlene Plater, Donn Pierini M   Wed May 25, 2022  1:54 PM) Leanora Ivanoff as needed  furosemide (LASIX) 20 MG tablet [id number] Yes Take 10 to 20 mg  daily as needed for swelling Monica Becton, MD Taking Active            Med Note Earlene Plater, Donn Pierini M   Wed May 25, 2022  1:56 PM) Reports takes 20 mg daily  gabapentin (NEURONTIN) 800 MG tablet [id number] Yes Take 1 tablet (800 mg total) by mouth 3 (three) times daily. Monica Becton, MD Taking Active            Med Note Earlene Plater, Donn Pierini M   Thu Jun 09, 2022  1:22 PM) Currently has 600 mg tablets and taking two tablets at night and one tablet in the morning  levothyroxine (SYNTHROID) 100 MCG tablet [id number] Yes TAKE 1 TABLET EVERY DAY BEFORE BREAKFAST Monica Becton, MD Taking Active   metoprolol succinate (TOPROL-XL) 25 MG 24 hr tablet [id number] Yes Take 12.5 mg by mouth daily. Takes 1/2 of a 25 mg tablet daily [provider] Taking Active Self  metoprolol succinate (TOPROL-XL) 50 MG 24 hr tablet [id number] No Take 25 mg by mouth daily.  Patient not taking: Reported on 06/09/2022   Monica Becton, MD Not Taking Active   nitroGLYCERIN (NITROSTAT) 0.4 MG SL tablet [id number] Yes PLACE 1 TABLET UNDER THE TONGUE EVERY 5 (FIVE) MINUTES AS NEEDED FOR CHEST PAIN AS DIRECTED Monica Becton, MD Taking Active   ondansetron (ZOFRAN) 8 MG tablet [id number] Yes Take 1 tablet (8 mg total) by mouth every 8 (eight) hours as needed for nausea or vomiting. Josph Macho, MD Taking Active   oxyCODONE ER [facility] ER) 9 MG C12A [id number] Yes Take 1 capsule by mouth in the morning and at bedtime. Monica Becton, MD Taking Active   oxyCODONE-acetaminophen (PERCOCET) 10-325 MG tablet [id number] Yes 1 tab 1-2 times daily as needed in addition to OxyContin. Monica Becton, MD Taking Active   potassium chloride SA (KLOR-CON M) 20 MEQ tablet [id number] Yes TAKE 1 TABLET TWICE DAILY Monica Becton, MD Taking Active            Med Note Earlene Plater, Donn Pierini M   Wed May 25, 2022  1:59 PM) Reports takes 1 tablet daily  prochlorperazine (COMPAZINE) 10 MG tablet  [id number] Yes Take 1 tablet (10 mg total) by mouth every 6 (six) hours as needed (Nausea or vomiting). Monica Becton, MD Taking Active   pyridoxine (B-6) 100 MG tablet [id number] Yes Take 100 mg by mouth daily.  [provider] Taking Active            Med Note Maple Hudson, TIERICA T   Wed Dec 20, 2017 12:15 PM)    silodosin (RAPAFLO) 8 MG CAPS capsule [id number] Yes Take 8 mg by mouth daily with breakfast. [provider] Taking Active Self  spironolactone (ALDACTONE) 25 MG tablet [id number] Yes Take 12.5 mg by mouth daily. [provider] Taking Active Self  tamsulosin (FLOMAX) 0.4 MG CAPS capsule [id number] No Take 1 capsule (  0.4 mg total) by mouth in the morning and at bedtime.  Patient not taking: Reported on 06/09/2022   Monica Becton, MD Not Taking Active   XARELTO 15 MG TABS tablet [id number] Yes Take by mouth daily. [provider] Taking Active   Zoledronic Acid 4 MG SOLR [id number] Yes Inject into the vein. [provider] Taking Active   Med List Note Otis Peak, [facility] 06/02/21 1339): Xpovio filled through [facility])              Assessment/Plan:   {Pharmacy A/P Choices:26421}  Follow Up Plan: ***  ***

## 2022-06-15 NOTE — Progress Notes (Signed)
Attempted to contact patient for scheduled appointment for medication management. Patient answered call but is currently in the hospital - deferring a medication review until after hospitalization to reconcile any changes. Rescheduled his appointment for one week.   Lynnda Shields, PharmD, BCPS Clinical Pharmacist [facility] Primary Care

## 2022-06-19 DIAGNOSIS — N183 Chronic kidney disease, stage 3 unspecified: Secondary | ICD-10-CM | POA: Diagnosis not present

## 2022-06-19 DIAGNOSIS — M170 Bilateral primary osteoarthritis of knee: Secondary | ICD-10-CM | POA: Diagnosis not present

## 2022-06-19 DIAGNOSIS — M19012 Primary osteoarthritis, left shoulder: Secondary | ICD-10-CM | POA: Diagnosis not present

## 2022-06-19 DIAGNOSIS — E1122 Type 2 diabetes mellitus with diabetic chronic kidney disease: Secondary | ICD-10-CM | POA: Diagnosis not present

## 2022-06-19 DIAGNOSIS — C9001 Multiple myeloma in remission: Secondary | ICD-10-CM | POA: Diagnosis not present

## 2022-06-19 DIAGNOSIS — I4510 Unspecified right bundle-branch block: Secondary | ICD-10-CM | POA: Diagnosis not present

## 2022-06-19 DIAGNOSIS — I4892 Unspecified atrial flutter: Secondary | ICD-10-CM | POA: Diagnosis not present

## 2022-06-19 DIAGNOSIS — J440 Chronic obstructive pulmonary disease with acute lower respiratory infection: Secondary | ICD-10-CM | POA: Diagnosis not present

## 2022-06-19 DIAGNOSIS — M19011 Primary osteoarthritis, right shoulder: Secondary | ICD-10-CM | POA: Diagnosis not present

## 2022-06-19 DIAGNOSIS — M5010 Cervical disc disorder with radiculopathy, unspecified cervical region: Secondary | ICD-10-CM | POA: Diagnosis not present

## 2022-06-19 DIAGNOSIS — D509 Iron deficiency anemia, unspecified: Secondary | ICD-10-CM | POA: Diagnosis not present

## 2022-06-19 DIAGNOSIS — G8929 Other chronic pain: Secondary | ICD-10-CM | POA: Diagnosis not present

## 2022-06-19 DIAGNOSIS — E1142 Type 2 diabetes mellitus with diabetic polyneuropathy: Secondary | ICD-10-CM | POA: Diagnosis not present

## 2022-06-19 DIAGNOSIS — M67472 Ganglion, left ankle and foot: Secondary | ICD-10-CM | POA: Diagnosis not present

## 2022-06-19 DIAGNOSIS — J470 Bronchiectasis with acute lower respiratory infection: Secondary | ICD-10-CM | POA: Diagnosis not present

## 2022-06-19 DIAGNOSIS — N400 Enlarged prostate without lower urinary tract symptoms: Secondary | ICD-10-CM | POA: Diagnosis not present

## 2022-06-19 DIAGNOSIS — J439 Emphysema, unspecified: Secondary | ICD-10-CM | POA: Diagnosis not present

## 2022-06-19 DIAGNOSIS — I2510 Atherosclerotic heart disease of native coronary artery without angina pectoris: Secondary | ICD-10-CM | POA: Diagnosis not present

## 2022-06-19 DIAGNOSIS — J189 Pneumonia, unspecified organism: Secondary | ICD-10-CM | POA: Diagnosis not present

## 2022-06-19 DIAGNOSIS — E039 Hypothyroidism, unspecified: Secondary | ICD-10-CM | POA: Diagnosis not present

## 2022-06-19 DIAGNOSIS — M48061 Spinal stenosis, lumbar region without neurogenic claudication: Secondary | ICD-10-CM | POA: Diagnosis not present

## 2022-06-19 DIAGNOSIS — I480 Paroxysmal atrial fibrillation: Secondary | ICD-10-CM | POA: Diagnosis not present

## 2022-06-19 DIAGNOSIS — I130 Hypertensive heart and chronic kidney disease with heart failure and stage 1 through stage 4 chronic kidney disease, or unspecified chronic kidney disease: Secondary | ICD-10-CM | POA: Diagnosis not present

## 2022-06-19 DIAGNOSIS — M47817 Spondylosis without myelopathy or radiculopathy, lumbosacral region: Secondary | ICD-10-CM | POA: Diagnosis not present

## 2022-06-19 DIAGNOSIS — I5021 Acute systolic (congestive) heart failure: Secondary | ICD-10-CM | POA: Diagnosis not present

## 2022-06-21 ENCOUNTER — Telehealth: Payer: Self-pay

## 2022-06-21 NOTE — Transitions of Care (Post Inpatient/ED Visit) (Signed)
   06/21/2022  Name: Agustine Gortney MRN: [id number] DOB: August 11, 1940  Today's TOC FU Call Status: Today's TOC FU Call Status:: Unsuccessul Call (1st Attempt) Unsuccessful Call (1st Attempt) Date: 06/21/22  Attempted to reach the patient regarding the most recent Inpatient/ED visit.  Follow Up Plan: Additional outreach attempts will be made to reach the patient to complete the Transitions of Care (Post Inpatient/ED visit) call.  Plan to call tomorrow, 06/22/22.  Jodelle Gross, RN, BSN, CCM Care Management Coordinator [place]

## 2022-06-22 ENCOUNTER — Telehealth: Payer: Self-pay

## 2022-06-22 DIAGNOSIS — I5021 Acute systolic (congestive) heart failure: Secondary | ICD-10-CM | POA: Diagnosis not present

## 2022-06-22 DIAGNOSIS — I130 Hypertensive heart and chronic kidney disease with heart failure and stage 1 through stage 4 chronic kidney disease, or unspecified chronic kidney disease: Secondary | ICD-10-CM | POA: Diagnosis not present

## 2022-06-22 DIAGNOSIS — E1122 Type 2 diabetes mellitus with diabetic chronic kidney disease: Secondary | ICD-10-CM | POA: Diagnosis not present

## 2022-06-22 DIAGNOSIS — J189 Pneumonia, unspecified organism: Secondary | ICD-10-CM | POA: Diagnosis not present

## 2022-06-22 DIAGNOSIS — J470 Bronchiectasis with acute lower respiratory infection: Secondary | ICD-10-CM | POA: Diagnosis not present

## 2022-06-22 DIAGNOSIS — J440 Chronic obstructive pulmonary disease with acute lower respiratory infection: Secondary | ICD-10-CM | POA: Diagnosis not present

## 2022-06-22 NOTE — Transitions of Care (Post Inpatient/ED Visit) (Addendum)
06/22/2022  Name: [name] [name] MRN: [id number] DOB: [birth year]  Today's TOC FU Call Status: Today's TOC FU Call Status:: Successful TOC FU Call Competed TOC FU Call Complete Date: 06/22/22  Transition Care Management Follow-up Telephone Call Date of Discharge: 06/20/22 Discharge Facility: Other ([facility]) Name of Other ([facility]) Discharge Facility: [facility] Type of Discharge: Inpatient Admission Primary Inpatient Discharge Diagnosis:: Acute Kidney Injury How have you been since you were released from the hospital?: Better Any questions or concerns?: No  Items Reviewed: Did you receive and understand the discharge instructions provided?: Yes Medications obtained,verified, and reconciled?: Yes (Medications Reviewed) Any new allergies since your discharge?: No Dietary orders reviewed?: Yes Type of Diet Ordered:: Regular Do you have support at home?: Yes People in Home: spouse Name of Support/Comfort Primary Source: [name]  Medications Reviewed Today: Medications Reviewed Today     Reviewed by Jodelle Gross, RN (Case Manager) on 06/22/22 at 1406  Med List Status: <None>   Medication Order Taking? Sig Documenting Provider Last Dose Status Informant  amiodarone (PACERONE) 200 MG tablet [id number] No Take 200 mg by mouth daily. Monica Becton, MD Taking Active Self           Med Note Michaela Corner   Fri Jan 21, 2022 11:01 AM) 01/21/22- patient reports prescribed by [facility] at time of hospital discharge on 01/19/22  amphetamine-dextroamphetamine (ADDERALL XR) 10 MG 24 hr capsule [id number] No Take 10 mg by mouth daily.  Patient not taking: Reported on 06/09/2022   [provider] Not Taking Active   amphetamine-dextroamphetamine (ADDERALL XR) 20 MG 24 hr capsule [id number] No Take 1 capsule (20 mg total) by mouth daily.  Patient not taking: Reported on 06/09/2022   Monica Becton, MD Not Taking Active   cetirizine (ZYRTEC) 10 MG tablet  [id number] No TAKE 1 TABLET EVERY DAY Monica Becton, MD Taking Active   Cyanocobalamin 1000 MCG TBCR [id number] No Take 1 tablet by mouth daily.  [provider] Taking Active            Med Note Juanda Crumble Apr 23, 2015  1:08 PM) Received from: [facility]  empagliflozin (JARDIANCE) 10 MG TABS tablet [id number] No Take by mouth daily. [provider] Taking Active   esomeprazole (NEXIUM) 40 MG capsule [id number] No TAKE 1 CAPSULE EVERY DAY AT 12:00 PM Monica Becton, MD Taking Active   Evolocumab [facility] SURECLICK) 140 MG/ML SOAJ [id number] No Inject 140 mg into the skin every 14 (fourteen) days. Monica Becton, MD Taking Active   famciclovir [facility]) 500 MG tablet [id number] No TAKE 1 TABLET EVERY DAY Josph Macho, MD Taking Active            Med Note Earlene Plater, Donn Pierini M   Thu Jun 09, 2022  1:20 PM) Reports takes 500 mg twice a day  finasteride (PROSCAR) 5 MG tablet [id number] No Take 5 mg by mouth daily. [provider] Taking Active Self  fluticasone (FLONASE) 50 MCG/ACT nasal spray [id number] No Place 2 sprays into both nostrils daily. Monica Becton, MD Taking Active            Med Note Earlene Plater, Donn Pierini M   Wed May 25, 2022  1:54 PM) Leanora Ivanoff as needed  furosemide (LASIX) 20 MG tablet [id number] No Take 10 to 20 mg daily as needed for swelling Monica Becton, MD Taking Active            Med  Note Earlene Plater, JUANA M   Wed May 25, 2022  1:56 PM) Reports takes 20 mg daily  gabapentin (NEURONTIN) 800 MG tablet [id number] No Take 1 tablet (800 mg total) by mouth 3 (three) times daily. Monica Becton, MD Taking Active            Med Note Earlene Plater, Donn Pierini M   Thu Jun 09, 2022  1:22 PM) Currently has 600 mg tablets and taking two tablets at night and one tablet in the morning  levothyroxine (SYNTHROID) 100 MCG tablet [id number] No TAKE 1 TABLET EVERY DAY BEFORE BREAKFAST Monica Becton, MD Taking  Active   metoprolol succinate (TOPROL-XL) 25 MG 24 hr tablet [id number] No Take 12.5 mg by mouth daily. Takes 1/2 of a 25 mg tablet daily [provider] Taking Active Self  metoprolol succinate (TOPROL-XL) 50 MG 24 hr tablet [id number] No Take 25 mg by mouth daily.  Patient not taking: Reported on 06/09/2022   Monica Becton, MD Not Taking Active   nitroGLYCERIN (NITROSTAT) 0.4 MG SL tablet [id number] No PLACE 1 TABLET UNDER THE TONGUE EVERY 5 (FIVE) MINUTES AS NEEDED FOR CHEST PAIN AS DIRECTED Monica Becton, MD Taking Active   ondansetron (ZOFRAN) 8 MG tablet [id number] No Take 1 tablet (8 mg total) by mouth every 8 (eight) hours as needed for nausea or vomiting. Josph Macho, MD Taking Active   oxyCODONE ER [facility] ER) 9 MG C12A [id number] No Take 1 capsule by mouth in the morning and at bedtime. Monica Becton, MD Taking Active   oxyCODONE-acetaminophen (PERCOCET) 10-325 MG tablet [id number] No 1 tab 1-2 times daily as needed in addition to OxyContin. Monica Becton, MD Taking Active   potassium chloride SA (KLOR-CON M) 20 MEQ tablet [id number] No TAKE 1 TABLET TWICE DAILY Monica Becton, MD Taking Active            Med Note Earlene Plater, Donn Pierini M   Wed May 25, 2022  1:59 PM) Reports takes 1 tablet daily  prochlorperazine (COMPAZINE) 10 MG tablet [id number] No Take 1 tablet (10 mg total) by mouth every 6 (six) hours as needed (Nausea or vomiting). Monica Becton, MD Taking Active   pyridoxine (B-6) 100 MG tablet [id number] No Take 100 mg by mouth daily.  [provider] Taking Active            Med Note Maple Hudson, TIERICA T   Wed Dec 20, 2017 12:15 PM)    silodosin (RAPAFLO) 8 MG CAPS capsule [id number] No Take 8 mg by mouth daily with breakfast. [provider] Taking Active Self  spironolactone (ALDACTONE) 25 MG tablet [id number] No Take 12.5 mg by mouth daily. [provider] Taking Active Self  tamsulosin (FLOMAX) 0.4 MG  CAPS capsule [id number] No Take 1 capsule (0.4 mg total) by mouth in the morning and at bedtime.  Patient not taking: Reported on 06/09/2022   Monica Becton, MD Not Taking Active   XARELTO 15 MG TABS tablet [id number] No Take by mouth daily. [provider] Taking Active   Zoledronic Acid 4 MG SOLR [id number] No Inject into the vein. [provider] Taking Active   Med List Note Otis Peak, [facility] 06/02/21 1339): X[facility]ovio [facility] through [facility]            Home Care and Equipment/Supplies: Were Home Health Services Ordered?: Yes Name of Home Health Agency:: [facility] Has Agency set up a time to come to your home?: Yes  First Home Health Visit Date: 06/22/22 Any new equipment or medical supplies ordered?: Yes Name of Medical supply agency?: [employer] Were you able to get the equipment/medical supplies?: Yes Do you have any questions related to the use of the equipment/supplies?: No  Functional Questionnaire: Do you need assistance with bathing/showering or dressing?: No Do you need assistance with meal preparation?: No Do you need assistance with eating?: No Do you have difficulty maintaining continence: No Do you need assistance with getting out of bed/getting out of a chair/moving?: No Do you have difficulty managing or taking your medications?: No  Follow up appointments reviewed: PCP Follow-up appointment confirmed?: Yes Date of PCP follow-up appointment?: 06/27/22 Follow-up Provider[name] Follow-up appointment confirmed?: NA Do you need transportation to your follow-up appointment?: No Do you understand care options if your condition(s) worsen?: Yes-patient verbalized understanding  SDOH Interventions Today    Flowsheet Row Most Recent Value  SDOH Interventions   Food Insecurity Interventions Intervention Not Indicated  Transportation Interventions Intervention Not Indicated  Utilities  Interventions Intervention Not Indicated      Interventions Today    Flowsheet Row Most Recent Value  Chronic Disease   Chronic disease during today's visit Chronic Obstructive Pulmonary Disease (COPD)  General Interventions   General Interventions Discussed/Reviewed Referral to Nurse  Doctor Visits Discussed/Reviewed Doctor Visits Discussed, PCP  PCP/Specialist Visits Compliance with follow-up visit       TOC Interventions Today    Flowsheet Row Most Recent Value  TOC Interventions   TOC Interventions Discussed/Reviewed TOC Interventions Discussed, TOC Interventions Reviewed, Contacted provider for patient needs       Jodelle Gross, RN, BSN, CCM Care Management Coordinator [place] Phone: [phone]/Fax: [phone]

## 2022-06-23 ENCOUNTER — Other Ambulatory Visit: Payer: Medicare Other | Admitting: Pharmacist

## 2022-06-23 NOTE — Progress Notes (Signed)
06/23/2022 Name: [name] [name] MRN: [id number] DOB: 17-Jun-1940  Chief Complaint  Patient presents with   Medication Management    [name] [name] is a 82 y.o. year old male who presented for a telephone visit.   They were referred to the pharmacist by their PCP & case management team for assistance in managing complex medication management, polypharmacy, medication reconciliation.   Gabapentin 300mg  2 capsules at bedtime  Subjective:  Care Team: Primary Care Provider: Monica Becton, MD    Medication Access/Adherence  Current Pharmacy:  [facility] [address], [place] - [address] [place] [address] Phone: [phone] Fax: [phone]  [facility] - [place], [place] - [address] [place] [address] Phone: [phone] Fax: [phone]  [facility] - [place], [place] - [address] [place] [employer] [place] [address] Phone: [phone] Fax: [phone]   Patient reports affordability concerns with their medications: No  Patient reports access/transportation concerns to their pharmacy: No  Patient reports adherence concerns with their medications:  No    Medication Management:  Current adherence strategy: pill box weekly organizer with AM, evening, PM slots. Does a week at a time and wife helps out.   Patient reports Good adherence to medications  Patient reports the following barriers to adherence: none at present, just complexity.   Objective:  Lab Results  Component Value Date   HGBA1C 6.3 (H) 10/14/2020    Lab Results  Component Value Date   CREATININE 1.32 (H) 05/27/2022   BUN 27 (H) 05/27/2022   NA 143 05/27/2022   K 4.9 05/27/2022   CL 103 05/27/2022   CO2 29 05/27/2022    Lab Results  Component Value Date   CHOL 216 (H) 10/14/2020   HDL 49 10/14/2020   LDLCALC 136 (H) 10/14/2020    TRIG 175 (H) 10/14/2020   CHOLHDL 4.4 10/14/2020    Medications Reviewed Today     Reviewed by [name] [name], RPH (Pharmacist) on 06/23/22 at 1155  Med List Status: <None>   Medication Order Taking? Sig Documenting Provider Last Dose Status Informant  albuterol (VENTOLIN HFA) 108 (90 Base) MCG/ACT inhaler [id number] Yes Inhale 1 puff into the lungs every 6 (six) hours as needed for wheezing or shortness of breath. [provider] Taking Active   amiodarone (PACERONE) 200 MG tablet [id number] Yes Take 200 mg by mouth daily. [name] Becton, MD Taking Active Self           Med Note [name] [name] Jun 23, 2022 11:24 AM)    aspirin EC 81 MG tablet [id number] Yes Take 81 mg by mouth daily. Swallow whole. [provider] Taking Active   Calcium Carbonate-Vitamin D 250-3.125 MG-MCG TABS [id number] Yes Take 1 tablet by mouth daily. [provider] Taking Active   cetirizine (ZYRTEC) 10 MG tablet [id number] Yes TAKE 1 TABLET EVERY DAY [name] Becton, MD Taking Active   ciprofloxacin (CIPRO) 500 MG tablet [id number] Yes Take 500 mg by mouth 2 (two) times daily. 9 days duration after 05/2022 hospitalization [provider] Taking Active   clobetasol ointment (TEMOVATE) 0.05 % [id number] Yes Apply 1 Application topically 2 (two) times daily. [provider] Taking Active   Cyanocobalamin 1000 MCG TBCR [id number] Yes Take 1 tablet by mouth daily.  [provider] Taking Active  Med Note [name] [name] Jun 23, 2022 11:23 AM)    empagliflozin (JARDIANCE) 10 MG TABS tablet [id number] Yes Take by mouth daily. [provider] Taking Active   esomeprazole (NEXIUM) 40 MG capsule [id number] Yes TAKE 1 CAPSULE EVERY DAY AT 12:00 PM [name] Becton, MD Taking Active   Evolocumab [facility]) 140 MG/ML [place]ory [place] [id number] Yes Inject 140 mg into the skin every 14 (fourteen) days. [name] Becton, MD Taking  Active   famciclovir [facility]) 500 MG tablet [id number] Yes TAKE 1 TABLET EVERY DAY  Patient taking differently: Take 500 mg by mouth 2 (two) times daily.   [name] Macho, MD Taking Active            Med Note [name] [name], Donn Pierini [name]   Thu Jun 09, 2022  1:20 PM) Reports takes 500 mg twice a day  finasteride (PROSCAR) 5 MG tablet [id number] Yes Take 5 mg by mouth daily. [provider] Taking Active Self  fluocinonide cream (LIDEX) 0.05 % [id number] Yes Apply 1 Application topically 2 (two) times daily. [provider] Taking Active   fluticasone (FLONASE) 50 MCG/ACT nasal spray [id number] Yes Place 2 sprays into both nostrils daily. [name] Becton, MD Taking Active            Med Note [name] [name], Donn Pierini [name]   Wed May 25, 2022  1:54 PM) [name] [name] as needed  furosemide (LASIX) 20 MG tablet [id number] Yes Take 10 to 20 mg daily as needed for swelling  Patient taking differently: Take 20 mg by mouth daily.   [name] Becton, MD Taking Active            Med Note [name] [name] Jun 23, 2022 11:29 AM)    gabapentin (NEURONTIN) 300 MG capsule [id number] Yes Take 600 mg by mouth at bedtime. [provider] Taking Active   levothyroxine (SYNTHROID) 100 MCG tablet [id number] Yes TAKE 1 TABLET EVERY DAY BEFORE BREAKFAST [name] Becton, MD Taking Active   metoprolol succinate (TOPROL-XL) 25 MG 24 hr tablet [id number] Yes Take 12.5 mg by mouth daily. Takes 1/2 of a 25 mg tablet daily [provider] Taking Active Self  Multiple Vitamins-Minerals (PRESERVISION AREDS) CAPS [id number] Yes Take 1 capsule by mouth daily. [provider] Taking Active   nitroGLYCERIN (NITROSTAT) 0.4 MG SL tablet [id number] Yes PLACE 1 TABLET UNDER THE TONGUE EVERY 5 (FIVE) MINUTES AS NEEDED FOR CHEST PAIN AS DIRECTED [name] Becton, MD Taking Active   ondansetron (ZOFRAN) 8 MG tablet [id number] No Take 1 tablet (8 mg total) by mouth every 8 (eight) hours as needed for nausea  or vomiting.  Patient not taking: Reported on 06/23/2022   [name] Macho, MD Not Taking Active   oxyCODONE ER [facility]) 9 MG C12A [id number] Yes Take 1 capsule by mouth in the morning and at bedtime. [name] Becton, MD Taking Active   oxyCODONE-acetaminophen (PERCOCET) 10-325 MG tablet [id number] Yes 1 tab 1-2 times daily as needed in addition to OxyContin. [name] Becton, MD Taking Active   potassium chloride [facility] [place] Dura [name]) 20 MEQ tablet [id number] Yes TAKE 1 TABLET TWICE DAILY [name] Becton, MD Taking Active            Med Note [name] [name] Jun 23, 2022 11:19 AM)    predniSONE (DELTASONE) 5 MG tablet [id number] Yes Take 5 mg by mouth daily with breakfast. 10 days duration after 05/2022 hospitalization [provider] Taking Active   prochlorperazine (COMPAZINE) 10 MG tablet [id number] Yes Take 1 tablet (10 mg total) by mouth every 6 (six) hours as needed (Nausea or vomiting). [name] Becton, MD Taking Active   pyridoxine (B-6) 100 MG tablet [id number] Yes Take 100 mg by mouth daily.  [provider] Taking Active            Med Note Maple Hudson, TIERICA T   Wed Dec 20, 2017 12:15 PM)    sacubitril-valsartan (ENTRESTO) 24-26 MG [id number] Yes Take 1 tablet by mouth 2 (two) times daily. [provider] Taking Active   XARELTO 15 MG TABS tablet [id number] Yes Take by mouth daily. [provider] Taking Active   Zoledronic Acid 4 MG SOLR [id number] Yes Inject into the vein. [provider] Taking Active   Med List Note Otis Peak, [facility] 06/02/21 1339): Xpovio filled through [facility])             Assessment/Plan:   Medication Management: - Currently strategy sufficient to maintain appropriate adherence to prescribed medication regimen - Suggested use of weekly pill box to organize medications - Completed medication reconciliation, reviewed and updated list for accuracy as well as  clinical review for safety/efficacy.  Follow Up Plan: PRN  Lynnda Shields, PharmD, BCPS Clinical Pharmacist [facility]

## 2022-06-23 NOTE — Patient Instructions (Signed)
[name] [name],  Today we reviewed your medication list and updated our records to reflect accurately against what you are taking. All looks good- keep up the good work with your routines and organizing the medicines.  If you need anything, you can respond to the mychart, but otherwise I will give [place] space and simply encourage you to keep engaged with Dr. Karie Schwalbe and your specialist providers. It was a pleasure chatting with you!  Take care, [name] [name], PharmD, BCPS Clinical Pharmacist [facility]

## 2022-06-26 DIAGNOSIS — J470 Bronchiectasis with acute lower respiratory infection: Secondary | ICD-10-CM | POA: Diagnosis not present

## 2022-06-26 DIAGNOSIS — E1122 Type 2 diabetes mellitus with diabetic chronic kidney disease: Secondary | ICD-10-CM | POA: Diagnosis not present

## 2022-06-26 DIAGNOSIS — J440 Chronic obstructive pulmonary disease with acute lower respiratory infection: Secondary | ICD-10-CM | POA: Diagnosis not present

## 2022-06-26 DIAGNOSIS — J189 Pneumonia, unspecified organism: Secondary | ICD-10-CM | POA: Diagnosis not present

## 2022-06-26 DIAGNOSIS — I5021 Acute systolic (congestive) heart failure: Secondary | ICD-10-CM | POA: Diagnosis not present

## 2022-06-26 DIAGNOSIS — I130 Hypertensive heart and chronic kidney disease with heart failure and stage 1 through stage 4 chronic kidney disease, or unspecified chronic kidney disease: Secondary | ICD-10-CM | POA: Diagnosis not present

## 2022-06-27 ENCOUNTER — Ambulatory Visit (INDEPENDENT_AMBULATORY_CARE_PROVIDER_SITE_OTHER): Payer: Medicare Other | Admitting: Sports Medicine

## 2022-06-27 ENCOUNTER — Encounter: Payer: Self-pay | Admitting: Sports Medicine

## 2022-06-27 VITALS — BP 87/55 | HR 72 | Ht 72.0 in | Wt 198.0 lb

## 2022-06-27 DIAGNOSIS — I130 Hypertensive heart and chronic kidney disease with heart failure and stage 1 through stage 4 chronic kidney disease, or unspecified chronic kidney disease: Secondary | ICD-10-CM | POA: Diagnosis not present

## 2022-06-27 DIAGNOSIS — I5021 Acute systolic (congestive) heart failure: Secondary | ICD-10-CM | POA: Diagnosis not present

## 2022-06-27 DIAGNOSIS — J470 Bronchiectasis with acute lower respiratory infection: Secondary | ICD-10-CM | POA: Diagnosis not present

## 2022-06-27 DIAGNOSIS — J189 Pneumonia, unspecified organism: Secondary | ICD-10-CM | POA: Diagnosis not present

## 2022-06-27 DIAGNOSIS — I10 Essential (primary) hypertension: Secondary | ICD-10-CM | POA: Diagnosis not present

## 2022-06-27 DIAGNOSIS — M5030 Other cervical disc degeneration, unspecified cervical region: Secondary | ICD-10-CM | POA: Diagnosis not present

## 2022-06-27 DIAGNOSIS — E1122 Type 2 diabetes mellitus with diabetic chronic kidney disease: Secondary | ICD-10-CM | POA: Diagnosis not present

## 2022-06-27 DIAGNOSIS — J440 Chronic obstructive pulmonary disease with acute lower respiratory infection: Secondary | ICD-10-CM | POA: Diagnosis not present

## 2022-06-27 DIAGNOSIS — J479 Bronchiectasis, uncomplicated: Secondary | ICD-10-CM | POA: Diagnosis not present

## 2022-06-27 NOTE — Assessment & Plan Note (Signed)
Known moderate to severe cervical spinal stenosis, last managed in August, increasing neck pain, his gabapentin was decreased during his hospitalization but this has not changed the neck pain significantly. He does take Xtampza and as needed oxycodone. He had 2 cervical epidurals in 2023 which seem to work well, we will proceed with another cervical epidural, left-sided C6-C7 interlaminar, he will need to stop his Xarelto approximately 48 hours prior to epidural injection.

## 2022-06-27 NOTE — Progress Notes (Signed)
    Procedures performed today:    None.  Independent interpretation of notes and tests performed by another provider:   None.  Brief History, Exam, Impression, and Recommendations:    Bronchiectasis without complication [facility]) [name] [name] 82 year old male, recent hospitalization for Pseudomonas bacteremia likely related to his bronchiectasis. Symptoms have improved, his blood cultures were negative on 3 May. He will do 2 more weeks of antibiotics, otherwise continue current regimen.  Essential hypertension, benign [name] [name] had some hypotension during a visit in the cancer center, rapid response called and he was admitted with blood pressures in the 70s systolic. He was ultimately found to have pneumonia as well as Pseudomonas bacteremia.  Also noted to have AKI.  Blood pressures improved to the hospitalization, blood cultures were negative on 3 May, at discharge his renal function had normalized. Blood pressure continues to run somewhat low, medications that could potentially be lowering his blood pressure include Entresto, Jardiance, metoprolol.  He only uses furosemide as needed for swelling, he is euvolemic today. At this point I would like him to liberalize the salt in his diet to help bring the blood pressure up, if this is insufficiently effective to bring his systolics to above 100-110 we will initially bring his Jardiance down to 5 mg daily, if that does not help over 2 weeks then we will decrease his Entresto to once daily.  Degenerative disc disease, cervical Known moderate to severe cervical spinal stenosis, last managed in August, increasing neck pain, his gabapentin was decreased during his hospitalization but this has not changed the neck pain significantly. He does take Xtampza and as needed oxycodone. He had 2 cervical epidurals in 2023 which seem to work well, we will proceed with another cervical epidural, left-sided C6-C7 interlaminar, he will need to stop his Xarelto  approximately 48 hours prior to epidural injection.    ____________________________________________ [name] [name]. [name] [name], M.D., ABFM., CAQSM., AME. Primary Care and Sports Medicine [place] [facility]  Adjunct Professor of Family Medicine  [place] [employer]  Restaurant manager, fast food

## 2022-06-27 NOTE — Assessment & Plan Note (Addendum)
[name] [name] had some hypotension during a visit in the cancer center, rapid response called and he was admitted with blood pressures in the 70s systolic. He was ultimately found to have pneumonia as well as Pseudomonas bacteremia.  Also noted to have AKI.  Blood pressures improved to the hospitalization, blood cultures were negative on 3 May, at discharge his renal function had normalized. Blood pressure continues to run somewhat low, medications that could potentially be lowering his blood pressure include Entresto, Jardiance, metoprolol.  He only uses furosemide as needed for swelling, he is euvolemic today. At this point I would like him to liberalize the salt in his diet to help bring the blood pressure up, if this is insufficiently effective to bring his systolics to above 100-110 we will initially bring his Jardiance down to 5 mg daily, if that does not help over 2 weeks then we will decrease his Entresto to once daily.

## 2022-06-27 NOTE — Assessment & Plan Note (Signed)
Pleasant 82 year old male, recent hospitalization for Pseudomonas bacteremia likely related to his bronchiectasis. Symptoms have improved, his blood cultures were negative on 3 May. He will do 2 more weeks of antibiotics, otherwise continue current regimen.

## 2022-06-30 DIAGNOSIS — J440 Chronic obstructive pulmonary disease with acute lower respiratory infection: Secondary | ICD-10-CM | POA: Diagnosis not present

## 2022-06-30 DIAGNOSIS — M899 Disorder of bone, unspecified: Secondary | ICD-10-CM | POA: Diagnosis not present

## 2022-06-30 DIAGNOSIS — E1122 Type 2 diabetes mellitus with diabetic chronic kidney disease: Secondary | ICD-10-CM | POA: Diagnosis not present

## 2022-06-30 DIAGNOSIS — I471 Supraventricular tachycardia, unspecified: Secondary | ICD-10-CM | POA: Diagnosis not present

## 2022-06-30 DIAGNOSIS — C9000 Multiple myeloma not having achieved remission: Secondary | ICD-10-CM | POA: Diagnosis not present

## 2022-06-30 DIAGNOSIS — J479 Bronchiectasis, uncomplicated: Secondary | ICD-10-CM | POA: Diagnosis not present

## 2022-06-30 DIAGNOSIS — I130 Hypertensive heart and chronic kidney disease with heart failure and stage 1 through stage 4 chronic kidney disease, or unspecified chronic kidney disease: Secondary | ICD-10-CM | POA: Diagnosis not present

## 2022-06-30 DIAGNOSIS — I5021 Acute systolic (congestive) heart failure: Secondary | ICD-10-CM | POA: Diagnosis not present

## 2022-06-30 DIAGNOSIS — D801 Nonfamilial hypogammaglobulinemia: Secondary | ICD-10-CM | POA: Diagnosis not present

## 2022-06-30 DIAGNOSIS — J470 Bronchiectasis with acute lower respiratory infection: Secondary | ICD-10-CM | POA: Diagnosis not present

## 2022-06-30 DIAGNOSIS — J189 Pneumonia, unspecified organism: Secondary | ICD-10-CM | POA: Diagnosis not present

## 2022-06-30 DIAGNOSIS — I951 Orthostatic hypotension: Secondary | ICD-10-CM | POA: Diagnosis not present

## 2022-07-01 DIAGNOSIS — J470 Bronchiectasis with acute lower respiratory infection: Secondary | ICD-10-CM | POA: Diagnosis not present

## 2022-07-01 DIAGNOSIS — E1122 Type 2 diabetes mellitus with diabetic chronic kidney disease: Secondary | ICD-10-CM | POA: Diagnosis not present

## 2022-07-01 DIAGNOSIS — I5021 Acute systolic (congestive) heart failure: Secondary | ICD-10-CM | POA: Diagnosis not present

## 2022-07-01 DIAGNOSIS — J440 Chronic obstructive pulmonary disease with acute lower respiratory infection: Secondary | ICD-10-CM | POA: Diagnosis not present

## 2022-07-01 DIAGNOSIS — J189 Pneumonia, unspecified organism: Secondary | ICD-10-CM | POA: Diagnosis not present

## 2022-07-01 DIAGNOSIS — I130 Hypertensive heart and chronic kidney disease with heart failure and stage 1 through stage 4 chronic kidney disease, or unspecified chronic kidney disease: Secondary | ICD-10-CM | POA: Diagnosis not present

## 2022-07-05 DIAGNOSIS — J189 Pneumonia, unspecified organism: Secondary | ICD-10-CM | POA: Diagnosis not present

## 2022-07-05 DIAGNOSIS — J440 Chronic obstructive pulmonary disease with acute lower respiratory infection: Secondary | ICD-10-CM | POA: Diagnosis not present

## 2022-07-05 DIAGNOSIS — E1122 Type 2 diabetes mellitus with diabetic chronic kidney disease: Secondary | ICD-10-CM | POA: Diagnosis not present

## 2022-07-05 DIAGNOSIS — I5021 Acute systolic (congestive) heart failure: Secondary | ICD-10-CM | POA: Diagnosis not present

## 2022-07-05 DIAGNOSIS — J470 Bronchiectasis with acute lower respiratory infection: Secondary | ICD-10-CM | POA: Diagnosis not present

## 2022-07-05 DIAGNOSIS — I130 Hypertensive heart and chronic kidney disease with heart failure and stage 1 through stage 4 chronic kidney disease, or unspecified chronic kidney disease: Secondary | ICD-10-CM | POA: Diagnosis not present

## 2022-07-06 ENCOUNTER — Ambulatory Visit: Payer: Self-pay

## 2022-07-06 ENCOUNTER — Telehealth: Payer: Self-pay

## 2022-07-06 NOTE — Patient Outreach (Signed)
  Care Coordination   Follow Up Visit Note   07/06/2022 Name: [name] [name] MRN: [id number] DOB: 01-10-1941  [name] [name] is a 82 y.o. year old male who sees [name], [name], MD for primary care. I spoke with  [name] by phone today.  What matters to the patients health and wellness today?  Readmitted 06/14/22 with pneumonia, bacteremia, hypotension, diarrhea. Mr. Christin reports overall improving. He states diarrhea is better and that blood pressure has increased post medication changes after PCP visit on 06/27/22. He reports his blood pressures have been running around 110/50's primarily. He continues to be active with home health PT and reports this has helped with strengthen. Reports h/o Arthritis neck/back-followed by Dr. Betha Loa seen 06/27/22). Per Mr. Hodgman plan for cervical epidural by Dr. Benjamin Stain. Patient states will arrange when needed. Mr. Heitmann also reports restart myeloma treatments in June.   Goals Addressed             This Visit's Progress    continue to improve post hospitalization       Interventions Today    Flowsheet Row Most Recent Value  Chronic Disease   Chronic disease during today's visit Chronic Obstructive Pulmonary Disease (COPD), Other  [myeloma]  General Interventions   General Interventions Discussed/Reviewed General Interventions Reviewed  Doctor Visits Discussed/Reviewed Doctor Visits Discussed  PCP/Specialist Visits Compliance with follow-up visit  Exercise Interventions   Exercise Discussed/Reviewed Exercise Reviewed  [encouraged to continue therapy with home health for muscle strength/tone/increase stamina]  Education Interventions   Education Provided Provided Education  Provided Verbal Education On Other, Medication, Exercise, When to see the doctor, Nutrition  [encouraged to continue to attend appointments as scheduled, perform exercises as recommended by therapist, contact provider if health questions/concerns or  any worsening condition,  encouraged to eat healthy]  Nutrition Interventions   Nutrition Discussed/Reviewed Nutrition Reviewed  Pharmacy Interventions   Pharmacy Dicussed/Reviewed Pharmacy Topics Discussed  [medication review completed]  Safety Interventions   Safety Discussed/Reviewed Safety Discussed, Fall Risk  [discussed importance of fall prevention strategies]            SDOH assessments and interventions completed:  No  Care Coordination Interventions:  Yes, provided   Follow up plan: Follow up call scheduled for 07/28/22    Encounter Outcome:  Pt. Visit Completed   Kathyrn Sheriff, RN, MSN, BSN, CCM [facility] Care Coordinator [phone]

## 2022-07-06 NOTE — Patient Outreach (Signed)
  Care Coordination   07/06/2022 Name: [name] [name] MRN: [id number] DOB: [birth year]   Care Coordination Outreach Attempts:  An unsuccessful telephone outreach was attempted for a scheduled appointment today.  Follow Up Plan:  Additional outreach attempts will be made to offer the patient care coordination information and services.   Encounter Outcome:  No Answer   Care Coordination Interventions:  No, not indicated    Kathyrn Sheriff, RN, MSN, BSN, CCM [facility] Care Coordinator [phone]

## 2022-07-06 NOTE — Patient Instructions (Signed)
Visit Information  Thank you for taking time to visit with me today. Please don't hesitate to contact me if I can be of assistance to you.   Following are the goals we discussed today:  Continue to take medications as prescribed. Continue to attend provider visits as scheduled Continue to eat healthy Continue to continue with home health therapy sessions for increase muscle/strength tone/stamina as recommended Continue fall prevention strategies   Our next appointment is by telephone on 07/28/22 at 10:30 am  Please call the care guide team at [phone] if you need to cancel or reschedule your appointment.   If you are experiencing a Mental Health or Behavioral Health Crisis or need someone to talk to, please call the Suicide and Crisis Lifeline: 63  Kathyrn Sheriff, RN, MSN, BSN, CCM [facility] Care Coordinator [phone]

## 2022-07-07 DIAGNOSIS — I5021 Acute systolic (congestive) heart failure: Secondary | ICD-10-CM | POA: Diagnosis not present

## 2022-07-07 DIAGNOSIS — J189 Pneumonia, unspecified organism: Secondary | ICD-10-CM | POA: Diagnosis not present

## 2022-07-07 DIAGNOSIS — J440 Chronic obstructive pulmonary disease with acute lower respiratory infection: Secondary | ICD-10-CM | POA: Diagnosis not present

## 2022-07-07 DIAGNOSIS — J470 Bronchiectasis with acute lower respiratory infection: Secondary | ICD-10-CM | POA: Diagnosis not present

## 2022-07-07 DIAGNOSIS — E1122 Type 2 diabetes mellitus with diabetic chronic kidney disease: Secondary | ICD-10-CM | POA: Diagnosis not present

## 2022-07-07 DIAGNOSIS — I130 Hypertensive heart and chronic kidney disease with heart failure and stage 1 through stage 4 chronic kidney disease, or unspecified chronic kidney disease: Secondary | ICD-10-CM | POA: Diagnosis not present

## 2022-07-12 DIAGNOSIS — E1122 Type 2 diabetes mellitus with diabetic chronic kidney disease: Secondary | ICD-10-CM | POA: Diagnosis not present

## 2022-07-12 DIAGNOSIS — J470 Bronchiectasis with acute lower respiratory infection: Secondary | ICD-10-CM | POA: Diagnosis not present

## 2022-07-12 DIAGNOSIS — I5021 Acute systolic (congestive) heart failure: Secondary | ICD-10-CM | POA: Diagnosis not present

## 2022-07-12 DIAGNOSIS — J189 Pneumonia, unspecified organism: Secondary | ICD-10-CM | POA: Diagnosis not present

## 2022-07-12 DIAGNOSIS — I130 Hypertensive heart and chronic kidney disease with heart failure and stage 1 through stage 4 chronic kidney disease, or unspecified chronic kidney disease: Secondary | ICD-10-CM | POA: Diagnosis not present

## 2022-07-12 DIAGNOSIS — J440 Chronic obstructive pulmonary disease with acute lower respiratory infection: Secondary | ICD-10-CM | POA: Diagnosis not present

## 2022-07-15 DIAGNOSIS — J189 Pneumonia, unspecified organism: Secondary | ICD-10-CM | POA: Diagnosis not present

## 2022-07-15 DIAGNOSIS — J440 Chronic obstructive pulmonary disease with acute lower respiratory infection: Secondary | ICD-10-CM | POA: Diagnosis not present

## 2022-07-15 DIAGNOSIS — J470 Bronchiectasis with acute lower respiratory infection: Secondary | ICD-10-CM | POA: Diagnosis not present

## 2022-07-15 DIAGNOSIS — E1122 Type 2 diabetes mellitus with diabetic chronic kidney disease: Secondary | ICD-10-CM | POA: Diagnosis not present

## 2022-07-15 DIAGNOSIS — I130 Hypertensive heart and chronic kidney disease with heart failure and stage 1 through stage 4 chronic kidney disease, or unspecified chronic kidney disease: Secondary | ICD-10-CM | POA: Diagnosis not present

## 2022-07-15 DIAGNOSIS — I5021 Acute systolic (congestive) heart failure: Secondary | ICD-10-CM | POA: Diagnosis not present

## 2022-07-18 ENCOUNTER — Telehealth: Payer: Self-pay | Admitting: *Deleted

## 2022-07-18 ENCOUNTER — Other Ambulatory Visit: Payer: Self-pay | Admitting: Sports Medicine

## 2022-07-18 DIAGNOSIS — C9000 Multiple myeloma not having achieved remission: Secondary | ICD-10-CM | POA: Diagnosis not present

## 2022-07-18 DIAGNOSIS — M47815 Spondylosis without myelopathy or radiculopathy, thoracolumbar region: Secondary | ICD-10-CM

## 2022-07-18 MED ORDER — OXYCODONE-ACETAMINOPHEN 10-325 MG PO TABS
ORAL_TABLET | ORAL | 0 refills | Status: DC
Start: 2022-07-18 — End: 2022-09-26

## 2022-07-18 MED ORDER — XTAMPZA ER 9 MG PO C12A
1.0000 | EXTENDED_RELEASE_CAPSULE | Freq: Two times a day (BID) | ORAL | 0 refills | Status: DC
Start: 2022-07-18 — End: 2022-08-26

## 2022-07-18 NOTE — Progress Notes (Signed)
  Care Coordination Note  07/18/2022 Name: [name] [name] MRN: [id number] DOB: 03/02/1940  [name] [name] is a 82 y.o. year old male who is a primary care patient of Benjamin Stain, [name], MD and is actively engaged with the care management team. I reached out to [name] by phone today to assist with re-scheduling a follow up visit with the RN Case Manager  Follow up plan: Unsuccessful telephone outreach attempt made. A HIPAA compliant phone message was left for the patient providing contact information and requesting a return call.   [facility]  Care Coordination Care Guide  Direct Dial: [phone]

## 2022-07-19 DIAGNOSIS — I110 Hypertensive heart disease with heart failure: Secondary | ICD-10-CM | POA: Diagnosis present

## 2022-07-19 DIAGNOSIS — R530 Neoplastic (malignant) related fatigue: Secondary | ICD-10-CM | POA: Diagnosis present

## 2022-07-19 DIAGNOSIS — Z7982 Long term (current) use of aspirin: Secondary | ICD-10-CM | POA: Diagnosis not present

## 2022-07-19 DIAGNOSIS — K449 Diaphragmatic hernia without obstruction or gangrene: Secondary | ICD-10-CM | POA: Diagnosis present

## 2022-07-19 DIAGNOSIS — J439 Emphysema, unspecified: Secondary | ICD-10-CM | POA: Diagnosis present

## 2022-07-19 DIAGNOSIS — I471 Supraventricular tachycardia, unspecified: Secondary | ICD-10-CM | POA: Diagnosis not present

## 2022-07-19 DIAGNOSIS — E1140 Type 2 diabetes mellitus with diabetic neuropathy, unspecified: Secondary | ICD-10-CM | POA: Diagnosis present

## 2022-07-19 DIAGNOSIS — I951 Orthostatic hypotension: Secondary | ICD-10-CM | POA: Diagnosis not present

## 2022-07-19 DIAGNOSIS — G893 Neoplasm related pain (acute) (chronic): Secondary | ICD-10-CM | POA: Diagnosis present

## 2022-07-19 DIAGNOSIS — Z955 Presence of coronary angioplasty implant and graft: Secondary | ICD-10-CM | POA: Diagnosis not present

## 2022-07-19 DIAGNOSIS — I429 Cardiomyopathy, unspecified: Secondary | ICD-10-CM | POA: Diagnosis not present

## 2022-07-19 DIAGNOSIS — D6869 Other thrombophilia: Secondary | ICD-10-CM | POA: Diagnosis present

## 2022-07-19 DIAGNOSIS — I2510 Atherosclerotic heart disease of native coronary artery without angina pectoris: Secondary | ICD-10-CM | POA: Diagnosis present

## 2022-07-19 DIAGNOSIS — D8481 Immunodeficiency due to conditions classified elsewhere: Secondary | ICD-10-CM | POA: Diagnosis present

## 2022-07-19 DIAGNOSIS — I10 Essential (primary) hypertension: Secondary | ICD-10-CM | POA: Diagnosis not present

## 2022-07-19 DIAGNOSIS — E785 Hyperlipidemia, unspecified: Secondary | ICD-10-CM | POA: Diagnosis present

## 2022-07-19 DIAGNOSIS — C9002 Multiple myeloma in relapse: Secondary | ICD-10-CM | POA: Diagnosis present

## 2022-07-19 DIAGNOSIS — I428 Other cardiomyopathies: Secondary | ICD-10-CM | POA: Diagnosis present

## 2022-07-19 DIAGNOSIS — J479 Bronchiectasis, uncomplicated: Secondary | ICD-10-CM | POA: Diagnosis not present

## 2022-07-19 DIAGNOSIS — M899 Disorder of bone, unspecified: Secondary | ICD-10-CM | POA: Diagnosis not present

## 2022-07-19 DIAGNOSIS — I480 Paroxysmal atrial fibrillation: Secondary | ICD-10-CM | POA: Diagnosis present

## 2022-07-19 DIAGNOSIS — H00012 Hordeolum externum right lower eyelid: Secondary | ICD-10-CM | POA: Diagnosis not present

## 2022-07-19 DIAGNOSIS — N400 Enlarged prostate without lower urinary tract symptoms: Secondary | ICD-10-CM | POA: Diagnosis present

## 2022-07-19 DIAGNOSIS — C9000 Multiple myeloma not having achieved remission: Secondary | ICD-10-CM | POA: Diagnosis not present

## 2022-07-19 DIAGNOSIS — R051 Acute cough: Secondary | ICD-10-CM | POA: Diagnosis not present

## 2022-07-19 DIAGNOSIS — D630 Anemia in neoplastic disease: Secondary | ICD-10-CM | POA: Diagnosis present

## 2022-07-19 DIAGNOSIS — Z5111 Encounter for antineoplastic chemotherapy: Secondary | ICD-10-CM | POA: Diagnosis not present

## 2022-07-19 DIAGNOSIS — Z79899 Other long term (current) drug therapy: Secondary | ICD-10-CM | POA: Diagnosis not present

## 2022-07-19 DIAGNOSIS — T451X5A Adverse effect of antineoplastic and immunosuppressive drugs, initial encounter: Secondary | ICD-10-CM | POA: Diagnosis present

## 2022-07-19 DIAGNOSIS — R0989 Other specified symptoms and signs involving the circulatory and respiratory systems: Secondary | ICD-10-CM | POA: Diagnosis not present

## 2022-07-19 DIAGNOSIS — Z9484 Stem cells transplant status: Secondary | ICD-10-CM | POA: Diagnosis not present

## 2022-07-19 DIAGNOSIS — C9001 Multiple myeloma in remission: Secondary | ICD-10-CM | POA: Diagnosis not present

## 2022-07-19 DIAGNOSIS — K219 Gastro-esophageal reflux disease without esophagitis: Secondary | ICD-10-CM | POA: Diagnosis present

## 2022-07-19 DIAGNOSIS — Z7984 Long term (current) use of oral hypoglycemic drugs: Secondary | ICD-10-CM | POA: Diagnosis not present

## 2022-07-19 DIAGNOSIS — I447 Left bundle-branch block, unspecified: Secondary | ICD-10-CM | POA: Diagnosis present

## 2022-07-19 DIAGNOSIS — I509 Heart failure, unspecified: Secondary | ICD-10-CM | POA: Diagnosis present

## 2022-07-19 DIAGNOSIS — J984 Other disorders of lung: Secondary | ICD-10-CM | POA: Diagnosis not present

## 2022-07-19 DIAGNOSIS — Z7901 Long term (current) use of anticoagulants: Secondary | ICD-10-CM | POA: Diagnosis not present

## 2022-07-19 DIAGNOSIS — D801 Nonfamilial hypogammaglobulinemia: Secondary | ICD-10-CM | POA: Diagnosis not present

## 2022-07-19 DIAGNOSIS — D6481 Anemia due to antineoplastic chemotherapy: Secondary | ICD-10-CM | POA: Diagnosis present

## 2022-07-19 DIAGNOSIS — Z79890 Hormone replacement therapy (postmenopausal): Secondary | ICD-10-CM | POA: Diagnosis not present

## 2022-07-20 DIAGNOSIS — C9002 Multiple myeloma in relapse: Secondary | ICD-10-CM | POA: Diagnosis not present

## 2022-07-21 DIAGNOSIS — I429 Cardiomyopathy, unspecified: Secondary | ICD-10-CM | POA: Diagnosis not present

## 2022-07-21 DIAGNOSIS — T451X5A Adverse effect of antineoplastic and immunosuppressive drugs, initial encounter: Secondary | ICD-10-CM | POA: Diagnosis not present

## 2022-07-21 DIAGNOSIS — I10 Essential (primary) hypertension: Secondary | ICD-10-CM | POA: Diagnosis not present

## 2022-07-21 DIAGNOSIS — C9002 Multiple myeloma in relapse: Secondary | ICD-10-CM | POA: Diagnosis not present

## 2022-07-21 NOTE — Progress Notes (Signed)
  Care Coordination Note  07/21/2022 Name: [name] [name] MRN: [id number] DOB: August 13, 1940  Alias [name] is a 82 y.o. year old male who is a primary care patient of Benjamin Stain, [name], MD and is actively engaged with the care management team. I reached out to [name] by phone today to assist with re-scheduling a follow up visit with the RN Case Manager  Follow up plan: Unsuccessful telephone outreach attempt made. A HIPAA compliant phone message was left for the patient providing contact information and requesting a return call.   [facility]  Care Coordination Care Guide  Direct Dial: [phone]

## 2022-07-21 NOTE — Progress Notes (Signed)
  Care Coordination Note  07/21/2022 Name: [name] MRN: [id number] DOB: 12-Dec-1940  [name] [name] is a 82 y.o. year old male who is a primary care patient of Benjamin Stain, [name], MD and is actively engaged with the care management team. I reached out to [name] by phone today to assist with re-scheduling a follow up visit with the RN Case Manager  Follow up plan: Telephone appointment with care management team member scheduled for:07/28/22 @10 :Leron Croak  Care Coordination Care Guide  Direct Dial: [phone]

## 2022-07-22 DIAGNOSIS — Z9484 Stem cells transplant status: Secondary | ICD-10-CM | POA: Diagnosis not present

## 2022-07-22 DIAGNOSIS — T451X5A Adverse effect of antineoplastic and immunosuppressive drugs, initial encounter: Secondary | ICD-10-CM | POA: Diagnosis present

## 2022-07-22 DIAGNOSIS — I509 Heart failure, unspecified: Secondary | ICD-10-CM | POA: Diagnosis present

## 2022-07-22 DIAGNOSIS — Z7984 Long term (current) use of oral hypoglycemic drugs: Secondary | ICD-10-CM | POA: Diagnosis not present

## 2022-07-22 DIAGNOSIS — G893 Neoplasm related pain (acute) (chronic): Secondary | ICD-10-CM | POA: Diagnosis present

## 2022-07-22 DIAGNOSIS — R530 Neoplastic (malignant) related fatigue: Secondary | ICD-10-CM | POA: Diagnosis present

## 2022-07-22 DIAGNOSIS — I480 Paroxysmal atrial fibrillation: Secondary | ICD-10-CM | POA: Diagnosis present

## 2022-07-22 DIAGNOSIS — D6869 Other thrombophilia: Secondary | ICD-10-CM | POA: Diagnosis present

## 2022-07-22 DIAGNOSIS — R051 Acute cough: Secondary | ICD-10-CM | POA: Diagnosis not present

## 2022-07-22 DIAGNOSIS — D630 Anemia in neoplastic disease: Secondary | ICD-10-CM | POA: Diagnosis present

## 2022-07-22 DIAGNOSIS — J439 Emphysema, unspecified: Secondary | ICD-10-CM | POA: Diagnosis present

## 2022-07-22 DIAGNOSIS — I471 Supraventricular tachycardia, unspecified: Secondary | ICD-10-CM | POA: Diagnosis not present

## 2022-07-22 DIAGNOSIS — N400 Enlarged prostate without lower urinary tract symptoms: Secondary | ICD-10-CM | POA: Diagnosis present

## 2022-07-22 DIAGNOSIS — E785 Hyperlipidemia, unspecified: Secondary | ICD-10-CM | POA: Diagnosis present

## 2022-07-22 DIAGNOSIS — R0989 Other specified symptoms and signs involving the circulatory and respiratory systems: Secondary | ICD-10-CM | POA: Diagnosis not present

## 2022-07-22 DIAGNOSIS — K219 Gastro-esophageal reflux disease without esophagitis: Secondary | ICD-10-CM | POA: Diagnosis present

## 2022-07-22 DIAGNOSIS — I429 Cardiomyopathy, unspecified: Secondary | ICD-10-CM | POA: Diagnosis not present

## 2022-07-22 DIAGNOSIS — J479 Bronchiectasis, uncomplicated: Secondary | ICD-10-CM | POA: Diagnosis not present

## 2022-07-22 DIAGNOSIS — I2510 Atherosclerotic heart disease of native coronary artery without angina pectoris: Secondary | ICD-10-CM | POA: Diagnosis present

## 2022-07-22 DIAGNOSIS — D801 Nonfamilial hypogammaglobulinemia: Secondary | ICD-10-CM | POA: Diagnosis not present

## 2022-07-22 DIAGNOSIS — I447 Left bundle-branch block, unspecified: Secondary | ICD-10-CM | POA: Diagnosis present

## 2022-07-22 DIAGNOSIS — I951 Orthostatic hypotension: Secondary | ICD-10-CM | POA: Diagnosis not present

## 2022-07-22 DIAGNOSIS — Z5111 Encounter for antineoplastic chemotherapy: Secondary | ICD-10-CM | POA: Diagnosis not present

## 2022-07-22 DIAGNOSIS — E1140 Type 2 diabetes mellitus with diabetic neuropathy, unspecified: Secondary | ICD-10-CM | POA: Diagnosis present

## 2022-07-22 DIAGNOSIS — J984 Other disorders of lung: Secondary | ICD-10-CM | POA: Diagnosis not present

## 2022-07-22 DIAGNOSIS — H00012 Hordeolum externum right lower eyelid: Secondary | ICD-10-CM | POA: Diagnosis not present

## 2022-07-22 DIAGNOSIS — I110 Hypertensive heart disease with heart failure: Secondary | ICD-10-CM | POA: Diagnosis present

## 2022-07-22 DIAGNOSIS — M899 Disorder of bone, unspecified: Secondary | ICD-10-CM | POA: Diagnosis not present

## 2022-07-22 DIAGNOSIS — Z7901 Long term (current) use of anticoagulants: Secondary | ICD-10-CM | POA: Diagnosis not present

## 2022-07-22 DIAGNOSIS — D8481 Immunodeficiency due to conditions classified elsewhere: Secondary | ICD-10-CM | POA: Diagnosis present

## 2022-07-22 DIAGNOSIS — I428 Other cardiomyopathies: Secondary | ICD-10-CM | POA: Diagnosis present

## 2022-07-22 DIAGNOSIS — C9002 Multiple myeloma in relapse: Secondary | ICD-10-CM | POA: Diagnosis present

## 2022-07-22 DIAGNOSIS — K449 Diaphragmatic hernia without obstruction or gangrene: Secondary | ICD-10-CM | POA: Diagnosis present

## 2022-07-22 DIAGNOSIS — D6481 Anemia due to antineoplastic chemotherapy: Secondary | ICD-10-CM | POA: Diagnosis present

## 2022-07-22 DIAGNOSIS — Z955 Presence of coronary angioplasty implant and graft: Secondary | ICD-10-CM | POA: Diagnosis not present

## 2022-07-22 DIAGNOSIS — C9000 Multiple myeloma not having achieved remission: Secondary | ICD-10-CM | POA: Diagnosis not present

## 2022-07-23 DIAGNOSIS — C9000 Multiple myeloma not having achieved remission: Secondary | ICD-10-CM | POA: Diagnosis not present

## 2022-07-23 DIAGNOSIS — H00012 Hordeolum externum right lower eyelid: Secondary | ICD-10-CM | POA: Diagnosis not present

## 2022-07-25 DIAGNOSIS — C9002 Multiple myeloma in relapse: Secondary | ICD-10-CM | POA: Diagnosis not present

## 2022-07-25 DIAGNOSIS — C9000 Multiple myeloma not having achieved remission: Secondary | ICD-10-CM | POA: Diagnosis not present

## 2022-07-25 DIAGNOSIS — R051 Acute cough: Secondary | ICD-10-CM | POA: Diagnosis not present

## 2022-07-25 DIAGNOSIS — J984 Other disorders of lung: Secondary | ICD-10-CM | POA: Diagnosis not present

## 2022-07-25 DIAGNOSIS — R0989 Other specified symptoms and signs involving the circulatory and respiratory systems: Secondary | ICD-10-CM | POA: Diagnosis not present

## 2022-08-01 DIAGNOSIS — C9001 Multiple myeloma in remission: Secondary | ICD-10-CM | POA: Diagnosis not present

## 2022-08-01 DIAGNOSIS — G620 Drug-induced polyneuropathy: Secondary | ICD-10-CM | POA: Diagnosis not present

## 2022-08-01 DIAGNOSIS — Z5111 Encounter for antineoplastic chemotherapy: Secondary | ICD-10-CM | POA: Diagnosis not present

## 2022-08-01 DIAGNOSIS — M899 Disorder of bone, unspecified: Secondary | ICD-10-CM | POA: Diagnosis not present

## 2022-08-01 DIAGNOSIS — Z9484 Stem cells transplant status: Secondary | ICD-10-CM | POA: Diagnosis not present

## 2022-08-01 DIAGNOSIS — Z7901 Long term (current) use of anticoagulants: Secondary | ICD-10-CM | POA: Diagnosis not present

## 2022-08-02 NOTE — Discharge Instructions (Signed)

## 2022-08-03 ENCOUNTER — Ambulatory Visit
Admission: RE | Admit: 2022-08-03 | Discharge: 2022-08-03 | Disposition: A | Discharge status: Home / Self Care | Payer: Medicare Other | Source: Ambulatory Visit | Attending: Sports Medicine | Admitting: Sports Medicine

## 2022-08-03 DIAGNOSIS — M5030 Other cervical disc degeneration, unspecified cervical region: Secondary | ICD-10-CM

## 2022-08-03 DIAGNOSIS — M4722 Other spondylosis with radiculopathy, cervical region: Secondary | ICD-10-CM | POA: Diagnosis not present

## 2022-08-03 MED ORDER — TRIAMCINOLONE ACETONIDE 40 MG/ML IJ SUSP (RADIOLOGY)
60.0000 mg | Freq: Once | INTRAMUSCULAR | Status: AC
  Administered 2022-08-03: 60 mg via EPIDURAL

## 2022-08-03 MED ORDER — IOPAMIDOL (ISOVUE-M 300) INJECTION 61%
1.0000 mL | Freq: Once | INTRAMUSCULAR | Status: AC
  Administered 2022-08-03: 1 mL via EPIDURAL

## 2022-08-08 DIAGNOSIS — C9000 Multiple myeloma not having achieved remission: Secondary | ICD-10-CM | POA: Diagnosis not present

## 2022-08-08 DIAGNOSIS — C9002 Multiple myeloma in relapse: Secondary | ICD-10-CM | POA: Diagnosis not present

## 2022-08-15 DIAGNOSIS — C9000 Multiple myeloma not having achieved remission: Secondary | ICD-10-CM | POA: Diagnosis not present

## 2022-08-22 DIAGNOSIS — G620 Drug-induced polyneuropathy: Secondary | ICD-10-CM | POA: Diagnosis not present

## 2022-08-22 DIAGNOSIS — M899 Disorder of bone, unspecified: Secondary | ICD-10-CM | POA: Diagnosis not present

## 2022-08-22 DIAGNOSIS — Z9484 Stem cells transplant status: Secondary | ICD-10-CM | POA: Diagnosis not present

## 2022-08-22 DIAGNOSIS — C9002 Multiple myeloma in relapse: Secondary | ICD-10-CM | POA: Diagnosis not present

## 2022-08-22 DIAGNOSIS — Z7901 Long term (current) use of anticoagulants: Secondary | ICD-10-CM | POA: Diagnosis not present

## 2022-08-22 DIAGNOSIS — N400 Enlarged prostate without lower urinary tract symptoms: Secondary | ICD-10-CM | POA: Diagnosis not present

## 2022-08-22 DIAGNOSIS — C9001 Multiple myeloma in remission: Secondary | ICD-10-CM | POA: Diagnosis not present

## 2022-08-22 DIAGNOSIS — Z5112 Encounter for antineoplastic immunotherapy: Secondary | ICD-10-CM | POA: Diagnosis not present

## 2022-08-22 DIAGNOSIS — C9000 Multiple myeloma not having achieved remission: Secondary | ICD-10-CM | POA: Diagnosis not present

## 2022-08-26 ENCOUNTER — Encounter: Payer: Self-pay | Admitting: Sports Medicine

## 2022-08-26 ENCOUNTER — Other Ambulatory Visit (INDEPENDENT_AMBULATORY_CARE_PROVIDER_SITE_OTHER): Payer: Medicare Other

## 2022-08-26 ENCOUNTER — Ambulatory Visit (INDEPENDENT_AMBULATORY_CARE_PROVIDER_SITE_OTHER): Payer: Medicare Other | Admitting: Sports Medicine

## 2022-08-26 VITALS — BP 111/64 | HR 76 | Ht 72.0 in | Wt 199.0 lb

## 2022-08-26 DIAGNOSIS — M5030 Other cervical disc degeneration, unspecified cervical region: Secondary | ICD-10-CM

## 2022-08-26 DIAGNOSIS — M170 Bilateral primary osteoarthritis of knee: Secondary | ICD-10-CM

## 2022-08-26 DIAGNOSIS — M47815 Spondylosis without myelopathy or radiculopathy, thoracolumbar region: Secondary | ICD-10-CM

## 2022-08-26 MED ORDER — XTAMPZA ER 9 MG PO C12A
1.0000 | EXTENDED_RELEASE_CAPSULE | Freq: Two times a day (BID) | ORAL | 0 refills | Status: DC
Start: 2022-08-26 — End: 2022-09-26

## 2022-08-26 NOTE — Assessment & Plan Note (Signed)
This pleasant 82 year old male also has moderate to severe cervical spinal stenosis, he had a cervical epidural about a month ago, he did well until recently, he had a fall, jarred his neck and has some increasing pain, on and off but not severe. I will refill his exams, I will set him up for his second cervical epidural. Return to see me 1 month as needed.

## 2022-08-26 NOTE — Assessment & Plan Note (Signed)
Increasing weakness, minor pain both knees, we last finished Orthovisc in January, it has been 7 months, we will restart Orthovisc. Return to see me 1 month for Orthovisc No. 2 of 4 both knees.

## 2022-08-26 NOTE — Progress Notes (Signed)
    Procedures performed today:    Procedure: Real-time Ultrasound Guided injection of the left knee Device: Samsung HS60  Verbal informed consent obtained.  Time-out conducted.  Noted no overlying erythema, induration, or other signs of local infection.  Skin prepped in a sterile fashion.  Local anesthesia: Topical Ethyl chloride.  With sterile technique and under real time ultrasound guidance: Trace effusion noted, 30 mg/2 mL of OrthoVisc (sodium hyaluronate) in a prefilled syringe was injected easily into the knee through a 22-gauge needle. Completed without difficulty  Advised to call if fevers/chills, erythema, induration, drainage, or persistent bleeding.  Images permanently stored and available for review in PACS.  Impression: Technically successful ultrasound guided injection.   Procedure: Real-time Ultrasound Guided injection of the right knee Device: Samsung HS60  Verbal informed consent obtained.  Time-out conducted.  Noted no overlying erythema, induration, or other signs of local infection.  Skin prepped in a sterile fashion.  Local anesthesia: Topical Ethyl chloride.  With sterile technique and under real time ultrasound guidance: Trace effusion noted, 30 mg/2 mL of OrthoVisc (sodium hyaluronate) in a prefilled syringe was injected easily into the knee through a 22-gauge needle. Completed without difficulty  Advised to call if fevers/chills, erythema, induration, drainage, or persistent bleeding.  Images permanently stored and available for review in PACS.  Impression: Technically successful ultrasound guided injection.  Independent interpretation of notes and tests performed by another provider:   None.  Brief History, Exam, Impression, and Recommendations:    Primary osteoarthritis of both knees Increasing weakness, minor pain both knees, we last finished Orthovisc in January, it has been 7 months, we will restart Orthovisc. Return to see me 1 month for Orthovisc  No. 2 of 4 both knees.  Degenerative disc disease, cervical This pleasant 82 year old male also has moderate to severe cervical spinal stenosis, he had a cervical epidural about a month ago, he did well until recently, he had a fall, jarred his neck and has some increasing pain, on and off but not severe. I will refill his exams, I will set him up for his second cervical epidural. Return to see me 1 month as needed.    ____________________________________________ [name]. Benjamin Stain, M.D., ABFM., CAQSM., AME. Primary Care and Sports Medicine [place] [facility]  Adjunct Professor of Family Medicine  [employer] [employer]  Restaurant manager, fast food

## 2022-08-31 ENCOUNTER — Ambulatory Visit: Payer: Self-pay

## 2022-08-31 ENCOUNTER — Telehealth: Payer: Self-pay

## 2022-08-31 NOTE — Patient Outreach (Signed)
  Care Coordination   Follow Up Visit Note   08/31/2022 Name: [name] [name] MRN: [id number] DOB: 03-Feb-1941  [name] [name] is a 82 y.o. year old male who sees [name], [name], MD for primary care. I spoke with  [name] by phone today.  What matters to the patients health and wellness today?  Reports Covid about 3 weeks ago and getting better,. Started back on treatment for multiple myeloma last treatment July 1st. He reports he is eating well; his weight is steady. He states saw PCP on 08/26/22 regarding knee injections which is working well. He denies any problems or issues a this time. Continue chemotherapy treatment with [facility]. [name] to contact RNCM if care coordination needs in the future.  Goals Addressed             This Visit's Progress    continue to improve post hospitalization       Interventions Today    Flowsheet Row Most Recent Value  Chronic Disease   Chronic disease during today's visit Other  [multiple myeloma chemotherapy reinitiated]  General Interventions   General Interventions Discussed/Reviewed General Interventions Reviewed  [reviewed care coordination program and encouraged patient to contact RNCM or PCP if care coordination needs in the future.]  Education Interventions   Education Provided Provided Education  Provided Verbal Education On Nutrition, Other, Medication  [advanced to continue to take medications as prescribed, attend provider visits as scheduled, eat healthy, contact provider with health questions or concerns.]  Nutrition Interventions   Nutrition Discussed/Reviewed Nutrition Discussed  Pharmacy Interventions   Pharmacy Dicussed/Reviewed Pharmacy Topics Discussed            SDOH assessments and interventions completed:  No  Care Coordination Interventions:  Yes, provided   Follow up plan: No further intervention required.   Encounter Outcome:  Pt. Visit Completed   Kathyrn Sheriff, RN, MSN, BSN, CCM [facility] Care  Coordinator [phone]

## 2022-08-31 NOTE — Patient Instructions (Signed)
Visit Information  Thank you for taking time to visit with me today. Please don't hesitate to contact me if I can be of assistance to you.   Following are the goals we discussed today:  Continue to attend provider visits as scheduled Continue to take medications as prescribed Continue to eat Healthy Contact your provider if health questions or concerns as needed    If you are experiencing a Mental Health or Behavioral Health Crisis or need someone to talk to, please call the Suicide and Crisis Lifeline: 9  Kathyrn Sheriff, RN, MSN, BSN, CCM [facility] Care Coordinator [phone]

## 2022-08-31 NOTE — Patient Outreach (Signed)
  Care Coordination   08/31/2022 Name: [name] [name] MRN: [id number] DOB: 1940/05/01   Care Coordination Outreach Attempts:  An unsuccessful telephone outreach was attempted for a scheduled appointment today.  Follow Up Plan:  Additional outreach attempts will be made to offer the patient care coordination information and services.   Encounter Outcome:  No Answer   Care Coordination Interventions:  No, not indicated    Kathyrn Sheriff, RN, MSN, BSN, CCM [facility] Care Coordinator [phone]

## 2022-09-02 ENCOUNTER — Other Ambulatory Visit (INDEPENDENT_AMBULATORY_CARE_PROVIDER_SITE_OTHER): Payer: Medicare Other

## 2022-09-02 ENCOUNTER — Ambulatory Visit (INDEPENDENT_AMBULATORY_CARE_PROVIDER_SITE_OTHER): Payer: Medicare Other | Admitting: Sports Medicine

## 2022-09-02 DIAGNOSIS — M170 Bilateral primary osteoarthritis of knee: Secondary | ICD-10-CM

## 2022-09-02 NOTE — Assessment & Plan Note (Signed)
Orthovisc No. 2 of 4 both knees, return in 1 week #3 of 4.

## 2022-09-02 NOTE — Progress Notes (Signed)
    Procedures performed today:    Procedure: Real-time Ultrasound Guided injection of the left knee Device: Samsung HS60  Verbal informed consent obtained.  Time-out conducted.  Noted no overlying erythema, induration, or other signs of local infection.  Skin prepped in a sterile fashion.  Local anesthesia: Topical Ethyl chloride.  With sterile technique and under real time ultrasound guidance: Trace effusion noted, 30 mg/2 mL of OrthoVisc (sodium hyaluronate) in a prefilled syringe was injected easily into the knee through a 22-gauge needle. Completed without difficulty  Advised to call if fevers/chills, erythema, induration, drainage, or persistent bleeding.  Images permanently stored and available for review in PACS.  Impression: Technically successful ultrasound guided injection.   Procedure: Real-time Ultrasound Guided injection of the right knee Device: Samsung HS60  Verbal informed consent obtained.  Time-out conducted.  Noted no overlying erythema, induration, or other signs of local infection.  Skin prepped in a sterile fashion.  Local anesthesia: Topical Ethyl chloride.  With sterile technique and under real time ultrasound guidance: Trace effusion noted, 30 mg/2 mL of OrthoVisc (sodium hyaluronate) in a prefilled syringe was injected easily into the knee through a 22-gauge needle. Completed without difficulty  Advised to call if fevers/chills, erythema, induration, drainage, or persistent bleeding.  Images permanently stored and available for review in PACS.  Impression: Technically successful ultrasound guided injection.  Independent interpretation of notes and tests performed by another provider:   None.  Brief History, Exam, Impression, and Recommendations:    Primary osteoarthritis of both knees Orthovisc No. 2 of 4 both knees, return in 1 week #3 of 4.    ____________________________________________ [name]. Benjamin Stain, M.D., ABFM., CAQSM., AME. Primary  Care and Sports Medicine [facility]  Adjunct Professor of Family Medicine  [place] [employer]  Restaurant manager, fast food

## 2022-09-05 DIAGNOSIS — M170 Bilateral primary osteoarthritis of knee: Secondary | ICD-10-CM

## 2022-09-05 DIAGNOSIS — C9000 Multiple myeloma not having achieved remission: Secondary | ICD-10-CM | POA: Diagnosis not present

## 2022-09-05 DIAGNOSIS — Z5112 Encounter for antineoplastic immunotherapy: Secondary | ICD-10-CM | POA: Diagnosis not present

## 2022-09-05 MED ORDER — HYALURONAN 30 MG/2ML IX SOSY
30.0000 mg | PREFILLED_SYRINGE | Freq: Once | INTRA_ARTICULAR | Status: AC
Start: 2022-09-05 — End: 2022-09-05
  Administered 2022-09-05: 30 mg via INTRA_ARTICULAR

## 2022-09-05 NOTE — Addendum Note (Signed)
Addended by: Carren Rang A on: 09/05/2022 10:35 AM   Modules accepted: Orders

## 2022-09-09 ENCOUNTER — Other Ambulatory Visit (INDEPENDENT_AMBULATORY_CARE_PROVIDER_SITE_OTHER): Payer: Medicare Other

## 2022-09-09 ENCOUNTER — Ambulatory Visit: Payer: Medicare Other | Admitting: Sports Medicine

## 2022-09-09 DIAGNOSIS — M170 Bilateral primary osteoarthritis of knee: Secondary | ICD-10-CM | POA: Diagnosis not present

## 2022-09-09 NOTE — Progress Notes (Signed)
    Procedures performed today:    Procedure: Real-time Ultrasound Guided injection of the left knee Device: Samsung HS60  Verbal informed consent obtained.  Time-out conducted.  Noted no overlying erythema, induration, or other signs of local infection.  Skin prepped in a sterile fashion.  Local anesthesia: Topical Ethyl chloride.  With sterile technique and under real time ultrasound guidance: Trace effusion noted, 30 mg/2 mL of OrthoVisc (sodium hyaluronate) in a prefilled syringe was injected easily into the knee through a 22-gauge needle. Completed without difficulty  Advised to call if fevers/chills, erythema, induration, drainage, or persistent bleeding.  Images permanently stored and available for review in PACS.  Impression: Technically successful ultrasound guided injection.   Procedure: Real-time Ultrasound Guided injection of the right knee Device: Samsung HS60  Verbal informed consent obtained.  Time-out conducted.  Noted no overlying erythema, induration, or other signs of local infection.  Skin prepped in a sterile fashion.  Local anesthesia: Topical Ethyl chloride.  With sterile technique and under real time ultrasound guidance: Trace effusion noted, 30 mg/2 mL of OrthoVisc (sodium hyaluronate) in a prefilled syringe was injected easily into the knee through a 22-gauge needle. Completed without difficulty  Advised to call if fevers/chills, erythema, induration, drainage, or persistent bleeding.  Images permanently stored and available for review in PACS.  Impression: Technically successful ultrasound guided injection.  Independent interpretation of notes and tests performed by another provider:   None.  Brief History, Exam, Impression, and Recommendations:    Primary osteoarthritis of both knees Orthovisc No. 3 of 4 both knees, return in 1 week #4 of 4.    ____________________________________________ [name]. Benjamin Stain, M.D., ABFM., CAQSM., AME. Primary  Care and Sports Medicine [place] [facility]  Adjunct Professor of Family Medicine  [place] [employer] [facility] [employer]  Restaurant manager, fast food

## 2022-09-09 NOTE — Assessment & Plan Note (Signed)
Orthovisc No. 3 of 4 both knees, return in 1 week #4 of 4.

## 2022-09-15 DIAGNOSIS — M170 Bilateral primary osteoarthritis of knee: Secondary | ICD-10-CM

## 2022-09-15 MED ORDER — HYALURONAN 30 MG/2ML IX SOSY
30.0000 mg | PREFILLED_SYRINGE | Freq: Once | INTRA_ARTICULAR | Status: AC
Start: 2022-09-15 — End: 2022-09-15
  Administered 2022-09-15: 30 mg via INTRA_ARTICULAR

## 2022-09-15 NOTE — Addendum Note (Signed)
Addended by: Holland Falling on: 09/15/2022 08:48 AM   Modules accepted: Orders

## 2022-09-16 ENCOUNTER — Ambulatory Visit (INDEPENDENT_AMBULATORY_CARE_PROVIDER_SITE_OTHER): Payer: Medicare Other | Admitting: Sports Medicine

## 2022-09-16 ENCOUNTER — Other Ambulatory Visit (INDEPENDENT_AMBULATORY_CARE_PROVIDER_SITE_OTHER): Payer: Medicare Other

## 2022-09-16 DIAGNOSIS — M170 Bilateral primary osteoarthritis of knee: Secondary | ICD-10-CM

## 2022-09-16 NOTE — Progress Notes (Signed)
    Procedures performed today:    Procedure: Real-time Ultrasound Guided injection of the left knee Device: Samsung HS60  Verbal informed consent obtained.  Time-out conducted.  Noted no overlying erythema, induration, or other signs of local infection.  Skin prepped in a sterile fashion.  Local anesthesia: Topical Ethyl chloride.  With sterile technique and under real time ultrasound guidance: Trace effusion noted, 30 mg/2 mL of OrthoVisc (sodium hyaluronate) in a prefilled syringe was injected easily into the knee through a 22-gauge needle. Completed without difficulty  Advised to call if fevers/chills, erythema, induration, drainage, or persistent bleeding.  Images permanently stored and available for review in PACS.  Impression: Technically successful ultrasound guided injection.   Procedure: Real-time Ultrasound Guided injection of the right knee Device: Samsung HS60  Verbal informed consent obtained.  Time-out conducted.  Noted no overlying erythema, induration, or other signs of local infection.  Skin prepped in a sterile fashion.  Local anesthesia: Topical Ethyl chloride.  With sterile technique and under real time ultrasound guidance: Trace effusion noted, 30 mg/2 mL of OrthoVisc (sodium hyaluronate) in a prefilled syringe was injected easily into the knee through a 22-gauge needle. Completed without difficulty  Advised to call if fevers/chills, erythema, induration, drainage, or persistent bleeding.  Images permanently stored and available for review in PACS.  Impression: Technically successful ultrasound guided injection.  Independent interpretation of notes and tests performed by another provider:   None.  Brief History, Exam, Impression, and Recommendations:    Primary osteoarthritis of both knees Orthovisc 4 of 4 both knees, return in 6 weeks as needed    ____________________________________________ [name]. Benjamin Stain, M.D., ABFM., CAQSM., AME. Primary  Care and Sports Medicine [place] [facility]  Adjunct Professor of Family Medicine  [place] [employer] [facility] [employer] Medicine  Restaurant manager, fast food

## 2022-09-16 NOTE — Assessment & Plan Note (Signed)
Orthovisc 4 of 4 both knees, return in 6 weeks as needed

## 2022-09-19 DIAGNOSIS — Z9484 Stem cells transplant status: Secondary | ICD-10-CM | POA: Diagnosis not present

## 2022-09-19 DIAGNOSIS — G620 Drug-induced polyneuropathy: Secondary | ICD-10-CM | POA: Diagnosis not present

## 2022-09-19 DIAGNOSIS — M899 Disorder of bone, unspecified: Secondary | ICD-10-CM | POA: Diagnosis not present

## 2022-09-19 DIAGNOSIS — C9001 Multiple myeloma in remission: Secondary | ICD-10-CM | POA: Diagnosis not present

## 2022-09-19 DIAGNOSIS — Z79899 Other long term (current) drug therapy: Secondary | ICD-10-CM | POA: Diagnosis not present

## 2022-09-19 DIAGNOSIS — Z5111 Encounter for antineoplastic chemotherapy: Secondary | ICD-10-CM | POA: Diagnosis not present

## 2022-09-19 DIAGNOSIS — C9000 Multiple myeloma not having achieved remission: Secondary | ICD-10-CM | POA: Diagnosis not present

## 2022-09-19 DIAGNOSIS — Z7901 Long term (current) use of anticoagulants: Secondary | ICD-10-CM | POA: Diagnosis not present

## 2022-09-20 ENCOUNTER — Other Ambulatory Visit: Payer: Self-pay | Admitting: Sports Medicine

## 2022-09-21 DIAGNOSIS — Z86711 Personal history of pulmonary embolism: Secondary | ICD-10-CM | POA: Diagnosis not present

## 2022-09-21 DIAGNOSIS — Z9989 Dependence on other enabling machines and devices: Secondary | ICD-10-CM | POA: Diagnosis not present

## 2022-09-21 DIAGNOSIS — J471 Bronchiectasis with (acute) exacerbation: Secondary | ICD-10-CM | POA: Diagnosis not present

## 2022-09-21 DIAGNOSIS — R918 Other nonspecific abnormal finding of lung field: Secondary | ICD-10-CM | POA: Diagnosis not present

## 2022-09-21 DIAGNOSIS — G4733 Obstructive sleep apnea (adult) (pediatric): Secondary | ICD-10-CM | POA: Diagnosis not present

## 2022-09-21 DIAGNOSIS — J479 Bronchiectasis, uncomplicated: Secondary | ICD-10-CM | POA: Diagnosis not present

## 2022-09-22 DIAGNOSIS — J479 Bronchiectasis, uncomplicated: Secondary | ICD-10-CM | POA: Diagnosis not present

## 2022-09-22 DIAGNOSIS — G4733 Obstructive sleep apnea (adult) (pediatric): Secondary | ICD-10-CM | POA: Diagnosis not present

## 2022-09-22 DIAGNOSIS — Z9989 Dependence on other enabling machines and devices: Secondary | ICD-10-CM | POA: Diagnosis not present

## 2022-09-23 DIAGNOSIS — J479 Bronchiectasis, uncomplicated: Secondary | ICD-10-CM | POA: Diagnosis not present

## 2022-09-26 ENCOUNTER — Other Ambulatory Visit: Payer: Self-pay | Admitting: Sports Medicine

## 2022-09-26 DIAGNOSIS — M47815 Spondylosis without myelopathy or radiculopathy, thoracolumbar region: Secondary | ICD-10-CM

## 2022-09-26 DIAGNOSIS — M5030 Other cervical disc degeneration, unspecified cervical region: Secondary | ICD-10-CM

## 2022-09-26 MED ORDER — OXYCODONE-ACETAMINOPHEN 10-325 MG PO TABS
ORAL_TABLET | ORAL | 0 refills | Status: DC
Start: 2022-09-26 — End: 2022-12-02

## 2022-09-26 MED ORDER — XTAMPZA ER 9 MG PO C12A
1.0000 | EXTENDED_RELEASE_CAPSULE | Freq: Two times a day (BID) | ORAL | 0 refills | Status: AC
Start: 2022-09-26 — End: ?

## 2022-10-03 ENCOUNTER — Encounter: Payer: Self-pay | Admitting: Sports Medicine

## 2022-10-03 DIAGNOSIS — C9002 Multiple myeloma in relapse: Secondary | ICD-10-CM | POA: Diagnosis not present

## 2022-10-03 DIAGNOSIS — Z5112 Encounter for antineoplastic immunotherapy: Secondary | ICD-10-CM | POA: Diagnosis not present

## 2022-10-07 ENCOUNTER — Ambulatory Visit (INDEPENDENT_AMBULATORY_CARE_PROVIDER_SITE_OTHER): Payer: Medicare Other

## 2022-10-07 ENCOUNTER — Encounter: Payer: Self-pay | Admitting: Sports Medicine

## 2022-10-07 ENCOUNTER — Ambulatory Visit (INDEPENDENT_AMBULATORY_CARE_PROVIDER_SITE_OTHER): Payer: Medicare Other | Admitting: Sports Medicine

## 2022-10-07 DIAGNOSIS — M25552 Pain in left hip: Secondary | ICD-10-CM | POA: Diagnosis not present

## 2022-10-07 DIAGNOSIS — M25551 Pain in right hip: Secondary | ICD-10-CM | POA: Diagnosis not present

## 2022-10-07 DIAGNOSIS — M47817 Spondylosis without myelopathy or radiculopathy, lumbosacral region: Secondary | ICD-10-CM | POA: Diagnosis not present

## 2022-10-07 DIAGNOSIS — G6289 Other specified polyneuropathies: Secondary | ICD-10-CM

## 2022-10-07 DIAGNOSIS — R29898 Other symptoms and signs involving the musculoskeletal system: Secondary | ICD-10-CM | POA: Diagnosis not present

## 2022-10-07 DIAGNOSIS — M170 Bilateral primary osteoarthritis of knee: Secondary | ICD-10-CM | POA: Diagnosis not present

## 2022-10-07 DIAGNOSIS — R531 Weakness: Secondary | ICD-10-CM | POA: Diagnosis not present

## 2022-10-07 NOTE — Assessment & Plan Note (Signed)
Unclear etiology, weakness both hips to flexion, he has excellent strength to knee extension and flexion, excellent strength to foot and ankle dorsiflexion. He has good internal rotation of his hip. This is likely an isolated hip flexor tendinopathy. I would like some pelvic x-rays and we will add some hip flexor conditioning for now. We are also working him up for a peripheral neuropathy below, he is currently undergoing treatment for multiple myeloma.

## 2022-10-07 NOTE — Assessment & Plan Note (Signed)
[name] [name] does have bilateral lower extremity numbness anterior leg from the knee down to the anterior foot bilaterally. He does have some back pain as well. He has known lumbar DDD, he does have a history of peripheral neuropathy. Lumbar spine MRI does show multiple areas of potential neuroforaminal compression. I am not sure as to whether the numbness coming down his leg is related to peripheral neuropathy and his multiple myeloma or if it is radicular, so I would like a nerve conduction and EMG.

## 2022-10-07 NOTE — Progress Notes (Signed)
    Procedures performed today:    None.  Independent interpretation of notes and tests performed by another provider:   None.  Brief History, Exam, Impression, and Recommendations:    Primary osteoarthritis of both knees We have finished a series of Orthovisc, still has some pain anteriorly but minimal, next step would be geniculate artery embolization, [name] [name] is not yet ready for this.  Hip weakness, bilateral Unclear etiology, weakness both hips to flexion, he has excellent strength to knee extension and flexion, excellent strength to foot and ankle dorsiflexion. He has good internal rotation of his hip. This is likely an isolated hip flexor tendinopathy. I would like some pelvic x-rays and we will add some hip flexor conditioning for now. We are also working him up for a peripheral neuropathy below, he is currently undergoing treatment for multiple myeloma.  Peripheral neuropathy [name] [name] does have bilateral lower extremity numbness anterior leg from the knee down to the anterior foot bilaterally. He does have some back pain as well. He has known lumbar DDD, he does have a history of peripheral neuropathy. Lumbar spine MRI does show multiple areas of potential neuroforaminal compression. I am not sure as to whether the numbness coming down his leg is related to peripheral neuropathy and his multiple myeloma or if it is radicular, so I would like a nerve conduction and EMG.    ____________________________________________ [name] [name]. Benjamin Stain, M.D., ABFM., CAQSM., AME. Primary Care and Sports Medicine [place] [facility]  Adjunct Professor of Family Medicine  [place] [employer]  Restaurant manager, fast food

## 2022-10-07 NOTE — Assessment & Plan Note (Signed)
We have finished a series of Orthovisc, still has some pain anteriorly but minimal, next step would be geniculate artery embolization, [name] [name] is not yet ready for this.

## 2022-10-10 DIAGNOSIS — N401 Enlarged prostate with lower urinary tract symptoms: Secondary | ICD-10-CM | POA: Diagnosis not present

## 2022-10-10 DIAGNOSIS — N471 Phimosis: Secondary | ICD-10-CM | POA: Diagnosis not present

## 2022-10-10 DIAGNOSIS — R39198 Other difficulties with micturition: Secondary | ICD-10-CM | POA: Diagnosis not present

## 2022-10-10 DIAGNOSIS — N138 Other obstructive and reflux uropathy: Secondary | ICD-10-CM | POA: Diagnosis not present

## 2022-10-11 ENCOUNTER — Encounter: Payer: Self-pay | Admitting: Neurology

## 2022-10-12 ENCOUNTER — Other Ambulatory Visit: Payer: Self-pay

## 2022-10-12 DIAGNOSIS — R202 Paresthesia of skin: Secondary | ICD-10-CM

## 2022-10-13 DIAGNOSIS — J8410 Pulmonary fibrosis, unspecified: Secondary | ICD-10-CM | POA: Diagnosis not present

## 2022-10-13 DIAGNOSIS — L539 Erythematous condition, unspecified: Secondary | ICD-10-CM | POA: Diagnosis not present

## 2022-10-13 DIAGNOSIS — R0602 Shortness of breath: Secondary | ICD-10-CM | POA: Diagnosis not present

## 2022-10-13 DIAGNOSIS — I5022 Chronic systolic (congestive) heart failure: Secondary | ICD-10-CM | POA: Diagnosis not present

## 2022-10-13 DIAGNOSIS — R238 Other skin changes: Secondary | ICD-10-CM | POA: Diagnosis not present

## 2022-10-13 DIAGNOSIS — C9000 Multiple myeloma not having achieved remission: Secondary | ICD-10-CM | POA: Diagnosis not present

## 2022-10-13 DIAGNOSIS — K449 Diaphragmatic hernia without obstruction or gangrene: Secondary | ICD-10-CM | POA: Diagnosis not present

## 2022-10-13 DIAGNOSIS — J479 Bronchiectasis, uncomplicated: Secondary | ICD-10-CM | POA: Diagnosis not present

## 2022-10-13 DIAGNOSIS — G4733 Obstructive sleep apnea (adult) (pediatric): Secondary | ICD-10-CM | POA: Diagnosis not present

## 2022-10-13 DIAGNOSIS — Z7984 Long term (current) use of oral hypoglycemic drugs: Secondary | ICD-10-CM | POA: Diagnosis not present

## 2022-10-13 DIAGNOSIS — Z87891 Personal history of nicotine dependence: Secondary | ICD-10-CM | POA: Diagnosis not present

## 2022-10-13 DIAGNOSIS — I255 Ischemic cardiomyopathy: Secondary | ICD-10-CM | POA: Diagnosis not present

## 2022-10-13 DIAGNOSIS — I959 Hypotension, unspecified: Secondary | ICD-10-CM | POA: Diagnosis not present

## 2022-10-13 DIAGNOSIS — N1832 Chronic kidney disease, stage 3b: Secondary | ICD-10-CM | POA: Diagnosis not present

## 2022-10-13 DIAGNOSIS — J811 Chronic pulmonary edema: Secondary | ICD-10-CM | POA: Diagnosis not present

## 2022-10-13 DIAGNOSIS — I951 Orthostatic hypotension: Secondary | ICD-10-CM | POA: Diagnosis not present

## 2022-10-13 DIAGNOSIS — M7989 Other specified soft tissue disorders: Secondary | ICD-10-CM | POA: Diagnosis not present

## 2022-10-13 DIAGNOSIS — R200 Anesthesia of skin: Secondary | ICD-10-CM | POA: Diagnosis not present

## 2022-10-13 DIAGNOSIS — I130 Hypertensive heart and chronic kidney disease with heart failure and stage 1 through stage 4 chronic kidney disease, or unspecified chronic kidney disease: Secondary | ICD-10-CM | POA: Diagnosis not present

## 2022-10-13 DIAGNOSIS — Z79899 Other long term (current) drug therapy: Secondary | ICD-10-CM | POA: Diagnosis not present

## 2022-10-13 DIAGNOSIS — J449 Chronic obstructive pulmonary disease, unspecified: Secondary | ICD-10-CM | POA: Diagnosis not present

## 2022-10-13 DIAGNOSIS — R600 Localized edema: Secondary | ICD-10-CM | POA: Diagnosis not present

## 2022-10-13 DIAGNOSIS — E1122 Type 2 diabetes mellitus with diabetic chronic kidney disease: Secondary | ICD-10-CM | POA: Diagnosis not present

## 2022-10-13 DIAGNOSIS — Z7901 Long term (current) use of anticoagulants: Secondary | ICD-10-CM | POA: Diagnosis not present

## 2022-10-13 DIAGNOSIS — Z9989 Dependence on other enabling machines and devices: Secondary | ICD-10-CM | POA: Diagnosis not present

## 2022-10-13 DIAGNOSIS — Z955 Presence of coronary angioplasty implant and graft: Secondary | ICD-10-CM | POA: Diagnosis not present

## 2022-10-13 DIAGNOSIS — R5383 Other fatigue: Secondary | ICD-10-CM | POA: Diagnosis not present

## 2022-10-13 DIAGNOSIS — E785 Hyperlipidemia, unspecified: Secondary | ICD-10-CM | POA: Diagnosis not present

## 2022-10-13 DIAGNOSIS — I2510 Atherosclerotic heart disease of native coronary artery without angina pectoris: Secondary | ICD-10-CM | POA: Diagnosis not present

## 2022-10-14 ENCOUNTER — Encounter: Payer: Medicare Other | Admitting: Neurology

## 2022-10-14 DIAGNOSIS — I255 Ischemic cardiomyopathy: Secondary | ICD-10-CM | POA: Diagnosis not present

## 2022-10-14 DIAGNOSIS — I951 Orthostatic hypotension: Secondary | ICD-10-CM | POA: Diagnosis not present

## 2022-10-14 DIAGNOSIS — R600 Localized edema: Secondary | ICD-10-CM | POA: Diagnosis not present

## 2022-10-14 DIAGNOSIS — N1832 Chronic kidney disease, stage 3b: Secondary | ICD-10-CM | POA: Diagnosis not present

## 2022-10-18 DIAGNOSIS — Z7901 Long term (current) use of anticoagulants: Secondary | ICD-10-CM | POA: Diagnosis not present

## 2022-10-18 DIAGNOSIS — G620 Drug-induced polyneuropathy: Secondary | ICD-10-CM | POA: Diagnosis not present

## 2022-10-18 DIAGNOSIS — Z9484 Stem cells transplant status: Secondary | ICD-10-CM | POA: Diagnosis not present

## 2022-10-18 DIAGNOSIS — C9002 Multiple myeloma in relapse: Secondary | ICD-10-CM | POA: Diagnosis not present

## 2022-10-18 DIAGNOSIS — C9001 Multiple myeloma in remission: Secondary | ICD-10-CM | POA: Diagnosis not present

## 2022-10-18 DIAGNOSIS — Z5111 Encounter for antineoplastic chemotherapy: Secondary | ICD-10-CM | POA: Diagnosis not present

## 2022-10-18 DIAGNOSIS — M899 Disorder of bone, unspecified: Secondary | ICD-10-CM | POA: Diagnosis not present

## 2022-10-18 LAB — COMPREHENSIVE METABOLIC PANEL: eGFR: 38.9

## 2022-10-21 ENCOUNTER — Ambulatory Visit (INDEPENDENT_AMBULATORY_CARE_PROVIDER_SITE_OTHER): Payer: Medicare Other | Admitting: Sports Medicine

## 2022-10-21 ENCOUNTER — Encounter: Payer: Self-pay | Admitting: Sports Medicine

## 2022-10-21 VITALS — BP 128/72 | HR 62

## 2022-10-21 DIAGNOSIS — R29898 Other symptoms and signs involving the musculoskeletal system: Secondary | ICD-10-CM

## 2022-10-21 DIAGNOSIS — E119 Type 2 diabetes mellitus without complications: Secondary | ICD-10-CM | POA: Diagnosis not present

## 2022-10-21 DIAGNOSIS — G6289 Other specified polyneuropathies: Secondary | ICD-10-CM | POA: Diagnosis not present

## 2022-10-21 DIAGNOSIS — R600 Localized edema: Secondary | ICD-10-CM | POA: Diagnosis not present

## 2022-10-21 NOTE — Assessment & Plan Note (Signed)
[name] [name] does have mild bilateral lower extremity edema, it typically stays fairly stable with occasional worsenings. He does have mild systolic CHF, mild renal insufficiency, he does have diffuse venous varicosities, spider veins and reticular veins. He has a good reason to have leg swelling. He was seen in the ED for concern of a DVT, lower extremity Dopplers were negative for DVT. We did discuss the importance of elevation as well as sufficiently tight graduated compression stockings. His stockings are fairly loose, he will switch to a higher compression for now. We will simply watch the lower lower extremity edema.

## 2022-10-21 NOTE — Assessment & Plan Note (Signed)
Continued bilateral hip flexor weakness. Excellent strength knee extension and flexion. Excellent strength to foot and ankle dorsiflexion and plantarflexion. Good internal rotation of his hips. Historically we suspected an isolated hip flexor tendinopathy back on the 23rd of last month. X-rays did show bilateral hip osteoarthritis. He needs to continue with his hip flexor conditioning but he declines formal physical therapy for now.

## 2022-10-21 NOTE — Progress Notes (Signed)
    Procedures performed today:    None.  Independent interpretation of notes and tests performed by another provider:   None.  Brief History, Exam, Impression, and Recommendations:    Bilateral lower extremity edema [name] [name] does have mild bilateral lower extremity edema, it typically stays fairly stable with occasional worsenings. He does have mild systolic CHF, mild renal insufficiency, he does have diffuse venous varicosities, spider veins and reticular veins. He has a good reason to have leg swelling. He was seen in the ED for concern of a DVT, lower extremity Dopplers were negative for DVT. We did discuss the importance of elevation as well as sufficiently tight graduated compression stockings. His stockings are fairly loose, he will switch to a higher compression for now. We will simply watch the lower lower extremity edema.  Hip weakness, bilateral Continued bilateral hip flexor weakness. Excellent strength knee extension and flexion. Excellent strength to foot and ankle dorsiflexion and plantarflexion. Good internal rotation of his hips. Historically we suspected an isolated hip flexor tendinopathy back on the 23rd of last month. X-rays did show bilateral hip osteoarthritis. He needs to continue with his hip flexor conditioning but he declines formal physical therapy for now.  Peripheral neuropathy [name] [name] does have bilateral lower extremity numbness anterior leg from the knee down to the anterior foot bilaterally with some back pain. Known lumbar DDD, known peripheral neuropathy. History of multiple myeloma, he is status post bone marrow splint, multiple rounds of chemotherapy. Lumbar spine MRI did show multiple areas of potential neuroforaminal compression. Historically we are unsure as to whether his numbness is coming down the leg is related to peripheral neuropathy with his multiple myeloma or radicular. We did order a nerve conduction and EMG but he would prefer a  neurologist closer to his home in [place].  I spent 30 minutes of total time managing this patient today, this includes chart review, face to face, and non-face to face time.  ____________________________________________ [name] [name]. [name] [name], M.D., ABFM., CAQSM., AME. Primary Care and Sports Medicine [place] [facility]  Adjunct Professor of Family Medicine  [place] [employer] [facility] [employer]  Restaurant manager, fast food

## 2022-10-21 NOTE — Assessment & Plan Note (Addendum)
Raimundo does have bilateral lower extremity numbness anterior leg from the knee down to the anterior foot bilaterally with some back pain. Known lumbar DDD, known peripheral neuropathy. History of multiple myeloma, he is status post bone marrow splint, multiple rounds of chemotherapy. Lumbar spine MRI did show multiple areas of potential neuroforaminal compression. Historically we are unsure as to whether his numbness is coming down the leg is related to peripheral neuropathy with his multiple myeloma or radicular. We did order a nerve conduction and EMG but he would prefer a neurologist closer to his home in [place].

## 2022-10-22 LAB — COMPREHENSIVE METABOLIC PANEL
ALT: 27 IU/L (ref 0–44)
AST: 18 IU/L (ref 0–40)
Albumin: 3.7 g/dL (ref 3.7–4.7)
Alkaline Phosphatase: 105 IU/L (ref 44–121)
BUN/Creatinine Ratio: 22 (ref 10–24)
BUN: 33 mg/dL — ABNORMAL HIGH (ref 8–27)
Bilirubin Total: 0.3 mg/dL (ref 0.0–1.2)
CO2: 23 mmol/L (ref 20–29)
Calcium: 8.8 mg/dL (ref 8.6–10.2)
Chloride: 102 mmol/L (ref 96–106)
Creatinine, Ser: 1.5 mg/dL — ABNORMAL HIGH (ref 0.76–1.27)
Globulin, Total: 1.6 g/dL (ref 1.5–4.5)
Glucose: 139 mg/dL — ABNORMAL HIGH (ref 70–99)
Potassium: 4.5 mmol/L (ref 3.5–5.2)
Sodium: 141 mmol/L (ref 134–144)
Total Protein: 5.3 g/dL — ABNORMAL LOW (ref 6.0–8.5)
eGFR: 46 mL/min/{1.73_m2} — ABNORMAL LOW (ref >59)

## 2022-10-22 LAB — LIPID PANEL
Chol/HDL Ratio: 2.7 ratio (ref 0.0–5.0)
Cholesterol, Total: 123 mg/dL (ref 100–199)
HDL: 45 mg/dL (ref >39)
LDL Chol Calc (NIH): 53 mg/dL (ref 0–99)
Triglycerides: 142 mg/dL (ref 0–149)
VLDL Cholesterol Cal: 25 mg/dL (ref 5–40)

## 2022-10-22 LAB — CBC
Hematocrit: 35.4 % — ABNORMAL LOW (ref 37.5–51.0)
Hemoglobin: 11.2 g/dL — ABNORMAL LOW (ref 13.0–17.7)
MCH: 28.9 pg (ref 26.6–33.0)
MCHC: 31.6 g/dL (ref 31.5–35.7)
MCV: 92 fL (ref 79–97)
Platelets: 226 10*3/uL (ref 150–450)
RBC: 3.87 x10E6/uL — ABNORMAL LOW (ref 4.14–5.80)
RDW: 17.3 % — ABNORMAL HIGH (ref 11.6–15.4)
WBC: 10.1 10*3/uL (ref 3.4–10.8)

## 2022-10-22 LAB — TSH: TSH: 1.23 u[IU]/mL (ref 0.450–4.500)

## 2022-10-22 LAB — HEMOGLOBIN A1C
Est. average glucose Bld gHb Est-mCnc: 137 mg/dL
Hgb A1c MFr Bld: 6.4 % — ABNORMAL HIGH (ref 4.8–5.6)

## 2022-10-24 ENCOUNTER — Encounter: Payer: Self-pay | Admitting: Sports Medicine

## 2022-10-26 DIAGNOSIS — I480 Paroxysmal atrial fibrillation: Secondary | ICD-10-CM | POA: Diagnosis not present

## 2022-10-26 DIAGNOSIS — E119 Type 2 diabetes mellitus without complications: Secondary | ICD-10-CM | POA: Diagnosis not present

## 2022-10-26 DIAGNOSIS — Z7901 Long term (current) use of anticoagulants: Secondary | ICD-10-CM | POA: Diagnosis not present

## 2022-10-26 DIAGNOSIS — I255 Ischemic cardiomyopathy: Secondary | ICD-10-CM | POA: Diagnosis not present

## 2022-10-26 DIAGNOSIS — G4733 Obstructive sleep apnea (adult) (pediatric): Secondary | ICD-10-CM | POA: Diagnosis not present

## 2022-10-26 DIAGNOSIS — N400 Enlarged prostate without lower urinary tract symptoms: Secondary | ICD-10-CM | POA: Diagnosis not present

## 2022-10-26 DIAGNOSIS — E039 Hypothyroidism, unspecified: Secondary | ICD-10-CM | POA: Diagnosis not present

## 2022-10-26 DIAGNOSIS — C9001 Multiple myeloma in remission: Secondary | ICD-10-CM | POA: Diagnosis not present

## 2022-10-26 DIAGNOSIS — Z01812 Encounter for preprocedural laboratory examination: Secondary | ICD-10-CM | POA: Diagnosis not present

## 2022-10-26 DIAGNOSIS — Z79899 Other long term (current) drug therapy: Secondary | ICD-10-CM | POA: Diagnosis not present

## 2022-10-26 DIAGNOSIS — J438 Other emphysema: Secondary | ICD-10-CM | POA: Diagnosis not present

## 2022-10-26 DIAGNOSIS — C9011 Plasma cell leukemia in remission: Secondary | ICD-10-CM | POA: Diagnosis not present

## 2022-10-26 DIAGNOSIS — I2510 Atherosclerotic heart disease of native coronary artery without angina pectoris: Secondary | ICD-10-CM | POA: Diagnosis not present

## 2022-10-26 DIAGNOSIS — Z01818 Encounter for other preprocedural examination: Secondary | ICD-10-CM | POA: Diagnosis not present

## 2022-10-26 DIAGNOSIS — Z86711 Personal history of pulmonary embolism: Secondary | ICD-10-CM | POA: Diagnosis not present

## 2022-10-28 ENCOUNTER — Encounter: Payer: Medicare Other | Admitting: Neurology

## 2022-10-31 DIAGNOSIS — C9002 Multiple myeloma in relapse: Secondary | ICD-10-CM | POA: Diagnosis not present

## 2022-10-31 DIAGNOSIS — Z5112 Encounter for antineoplastic immunotherapy: Secondary | ICD-10-CM | POA: Diagnosis not present

## 2022-11-08 DIAGNOSIS — Z885 Allergy status to narcotic agent status: Secondary | ICD-10-CM | POA: Diagnosis not present

## 2022-11-08 DIAGNOSIS — I4891 Unspecified atrial fibrillation: Secondary | ICD-10-CM | POA: Diagnosis not present

## 2022-11-08 DIAGNOSIS — I509 Heart failure, unspecified: Secondary | ICD-10-CM | POA: Diagnosis not present

## 2022-11-08 DIAGNOSIS — I130 Hypertensive heart and chronic kidney disease with heart failure and stage 1 through stage 4 chronic kidney disease, or unspecified chronic kidney disease: Secondary | ICD-10-CM | POA: Diagnosis not present

## 2022-11-08 DIAGNOSIS — Z87891 Personal history of nicotine dependence: Secondary | ICD-10-CM | POA: Diagnosis not present

## 2022-11-08 DIAGNOSIS — Z888 Allergy status to other drugs, medicaments and biological substances status: Secondary | ICD-10-CM | POA: Diagnosis not present

## 2022-11-08 DIAGNOSIS — R3916 Straining to void: Secondary | ICD-10-CM | POA: Diagnosis not present

## 2022-11-08 DIAGNOSIS — K449 Diaphragmatic hernia without obstruction or gangrene: Secondary | ICD-10-CM | POA: Diagnosis not present

## 2022-11-08 DIAGNOSIS — E785 Hyperlipidemia, unspecified: Secondary | ICD-10-CM | POA: Diagnosis not present

## 2022-11-08 DIAGNOSIS — Z79899 Other long term (current) drug therapy: Secondary | ICD-10-CM | POA: Diagnosis not present

## 2022-11-08 DIAGNOSIS — N401 Enlarged prostate with lower urinary tract symptoms: Secondary | ICD-10-CM | POA: Diagnosis not present

## 2022-11-08 DIAGNOSIS — E7849 Other hyperlipidemia: Secondary | ICD-10-CM | POA: Diagnosis not present

## 2022-11-08 DIAGNOSIS — I2510 Atherosclerotic heart disease of native coronary artery without angina pectoris: Secondary | ICD-10-CM | POA: Diagnosis not present

## 2022-11-08 DIAGNOSIS — Z7952 Long term (current) use of systemic steroids: Secondary | ICD-10-CM | POA: Diagnosis not present

## 2022-11-08 DIAGNOSIS — Z7984 Long term (current) use of oral hypoglycemic drugs: Secondary | ICD-10-CM | POA: Diagnosis not present

## 2022-11-08 DIAGNOSIS — E1122 Type 2 diabetes mellitus with diabetic chronic kidney disease: Secondary | ICD-10-CM | POA: Diagnosis not present

## 2022-11-08 DIAGNOSIS — Z7982 Long term (current) use of aspirin: Secondary | ICD-10-CM | POA: Diagnosis not present

## 2022-11-08 DIAGNOSIS — G4733 Obstructive sleep apnea (adult) (pediatric): Secondary | ICD-10-CM | POA: Diagnosis not present

## 2022-11-08 DIAGNOSIS — K219 Gastro-esophageal reflux disease without esophagitis: Secondary | ICD-10-CM | POA: Diagnosis not present

## 2022-11-08 DIAGNOSIS — E039 Hypothyroidism, unspecified: Secondary | ICD-10-CM | POA: Diagnosis not present

## 2022-11-08 DIAGNOSIS — Z7901 Long term (current) use of anticoagulants: Secondary | ICD-10-CM | POA: Diagnosis not present

## 2022-11-08 DIAGNOSIS — I482 Chronic atrial fibrillation, unspecified: Secondary | ICD-10-CM | POA: Diagnosis not present

## 2022-11-08 DIAGNOSIS — N183 Chronic kidney disease, stage 3 unspecified: Secondary | ICD-10-CM | POA: Diagnosis not present

## 2022-11-08 DIAGNOSIS — Z79890 Hormone replacement therapy (postmenopausal): Secondary | ICD-10-CM | POA: Diagnosis not present

## 2022-11-08 DIAGNOSIS — J449 Chronic obstructive pulmonary disease, unspecified: Secondary | ICD-10-CM | POA: Diagnosis not present

## 2022-11-08 DIAGNOSIS — Z886 Allergy status to analgesic agent status: Secondary | ICD-10-CM | POA: Diagnosis not present

## 2022-11-09 DIAGNOSIS — I482 Chronic atrial fibrillation, unspecified: Secondary | ICD-10-CM | POA: Diagnosis not present

## 2022-11-09 DIAGNOSIS — E1122 Type 2 diabetes mellitus with diabetic chronic kidney disease: Secondary | ICD-10-CM | POA: Diagnosis not present

## 2022-11-09 DIAGNOSIS — N401 Enlarged prostate with lower urinary tract symptoms: Secondary | ICD-10-CM | POA: Diagnosis not present

## 2022-11-09 DIAGNOSIS — R3916 Straining to void: Secondary | ICD-10-CM | POA: Diagnosis not present

## 2022-11-09 DIAGNOSIS — G4733 Obstructive sleep apnea (adult) (pediatric): Secondary | ICD-10-CM | POA: Diagnosis not present

## 2022-11-09 DIAGNOSIS — I2510 Atherosclerotic heart disease of native coronary artery without angina pectoris: Secondary | ICD-10-CM | POA: Diagnosis not present

## 2022-11-10 ENCOUNTER — Encounter: Payer: Medicare Other | Admitting: Neurology

## 2022-11-11 ENCOUNTER — Other Ambulatory Visit: Payer: Self-pay | Admitting: Sports Medicine

## 2022-11-11 DIAGNOSIS — M5030 Other cervical disc degeneration, unspecified cervical region: Secondary | ICD-10-CM

## 2022-11-11 DIAGNOSIS — N402 Nodular prostate without lower urinary tract symptoms: Secondary | ICD-10-CM | POA: Diagnosis not present

## 2022-11-11 DIAGNOSIS — M47815 Spondylosis without myelopathy or radiculopathy, thoracolumbar region: Secondary | ICD-10-CM

## 2022-11-14 DIAGNOSIS — C9002 Multiple myeloma in relapse: Secondary | ICD-10-CM | POA: Diagnosis not present

## 2022-11-14 DIAGNOSIS — Z79890 Hormone replacement therapy (postmenopausal): Secondary | ICD-10-CM | POA: Diagnosis not present

## 2022-11-14 DIAGNOSIS — C9001 Multiple myeloma in remission: Secondary | ICD-10-CM | POA: Diagnosis not present

## 2022-11-14 DIAGNOSIS — Z7982 Long term (current) use of aspirin: Secondary | ICD-10-CM | POA: Diagnosis not present

## 2022-11-14 DIAGNOSIS — R42 Dizziness and giddiness: Secondary | ICD-10-CM | POA: Diagnosis not present

## 2022-11-14 DIAGNOSIS — R5383 Other fatigue: Secondary | ICD-10-CM | POA: Diagnosis not present

## 2022-11-14 DIAGNOSIS — Z79899 Other long term (current) drug therapy: Secondary | ICD-10-CM | POA: Diagnosis not present

## 2022-11-14 DIAGNOSIS — Z7901 Long term (current) use of anticoagulants: Secondary | ICD-10-CM | POA: Diagnosis not present

## 2022-11-14 DIAGNOSIS — Z5111 Encounter for antineoplastic chemotherapy: Secondary | ICD-10-CM | POA: Diagnosis not present

## 2022-11-14 DIAGNOSIS — R0602 Shortness of breath: Secondary | ICD-10-CM | POA: Diagnosis not present

## 2022-11-14 DIAGNOSIS — Z9484 Stem cells transplant status: Secondary | ICD-10-CM | POA: Diagnosis not present

## 2022-11-14 DIAGNOSIS — M899 Disorder of bone, unspecified: Secondary | ICD-10-CM | POA: Diagnosis not present

## 2022-11-15 DIAGNOSIS — H3530 Unspecified macular degeneration: Secondary | ICD-10-CM | POA: Diagnosis not present

## 2022-11-15 MED ORDER — XTAMPZA ER 9 MG PO C12A: 1.0000 | EXTENDED_RELEASE_CAPSULE | Freq: Two times a day (BID) | ORAL | 0 refills | Status: DC

## 2022-11-18 DIAGNOSIS — M7989 Other specified soft tissue disorders: Secondary | ICD-10-CM | POA: Diagnosis not present

## 2022-11-22 DIAGNOSIS — G629 Polyneuropathy, unspecified: Secondary | ICD-10-CM | POA: Diagnosis present

## 2022-11-22 DIAGNOSIS — I4891 Unspecified atrial fibrillation: Secondary | ICD-10-CM | POA: Diagnosis not present

## 2022-11-22 DIAGNOSIS — A4152 Sepsis due to Pseudomonas: Secondary | ICD-10-CM | POA: Diagnosis present

## 2022-11-22 DIAGNOSIS — M5127 Other intervertebral disc displacement, lumbosacral region: Secondary | ICD-10-CM | POA: Diagnosis not present

## 2022-11-22 DIAGNOSIS — I5023 Acute on chronic systolic (congestive) heart failure: Secondary | ICD-10-CM | POA: Diagnosis present

## 2022-11-22 DIAGNOSIS — R109 Unspecified abdominal pain: Secondary | ICD-10-CM | POA: Diagnosis not present

## 2022-11-22 DIAGNOSIS — M5030 Other cervical disc degeneration, unspecified cervical region: Secondary | ICD-10-CM | POA: Diagnosis not present

## 2022-11-22 DIAGNOSIS — R140 Abdominal distension (gaseous): Secondary | ICD-10-CM | POA: Diagnosis not present

## 2022-11-22 DIAGNOSIS — I480 Paroxysmal atrial fibrillation: Secondary | ICD-10-CM | POA: Diagnosis not present

## 2022-11-22 DIAGNOSIS — R7881 Bacteremia: Secondary | ICD-10-CM | POA: Diagnosis not present

## 2022-11-22 DIAGNOSIS — Z9079 Acquired absence of other genital organ(s): Secondary | ICD-10-CM | POA: Diagnosis not present

## 2022-11-22 DIAGNOSIS — R918 Other nonspecific abnormal finding of lung field: Secondary | ICD-10-CM | POA: Diagnosis not present

## 2022-11-22 DIAGNOSIS — C9000 Multiple myeloma not having achieved remission: Secondary | ICD-10-CM | POA: Diagnosis present

## 2022-11-22 DIAGNOSIS — D649 Anemia, unspecified: Secondary | ICD-10-CM | POA: Diagnosis not present

## 2022-11-22 DIAGNOSIS — K5900 Constipation, unspecified: Secondary | ICD-10-CM | POA: Diagnosis not present

## 2022-11-22 DIAGNOSIS — I2699 Other pulmonary embolism without acute cor pulmonale: Secondary | ICD-10-CM | POA: Diagnosis not present

## 2022-11-22 DIAGNOSIS — E785 Hyperlipidemia, unspecified: Secondary | ICD-10-CM | POA: Diagnosis present

## 2022-11-22 DIAGNOSIS — K5903 Drug induced constipation: Secondary | ICD-10-CM | POA: Diagnosis not present

## 2022-11-22 DIAGNOSIS — R6520 Severe sepsis without septic shock: Secondary | ICD-10-CM | POA: Diagnosis present

## 2022-11-22 DIAGNOSIS — M5126 Other intervertebral disc displacement, lumbar region: Secondary | ICD-10-CM | POA: Diagnosis not present

## 2022-11-22 DIAGNOSIS — E1142 Type 2 diabetes mellitus with diabetic polyneuropathy: Secondary | ICD-10-CM | POA: Diagnosis present

## 2022-11-22 DIAGNOSIS — I2510 Atherosclerotic heart disease of native coronary artery without angina pectoris: Secondary | ICD-10-CM | POA: Diagnosis not present

## 2022-11-22 DIAGNOSIS — I351 Nonrheumatic aortic (valve) insufficiency: Secondary | ICD-10-CM | POA: Diagnosis not present

## 2022-11-22 DIAGNOSIS — J189 Pneumonia, unspecified organism: Secondary | ICD-10-CM | POA: Diagnosis not present

## 2022-11-22 DIAGNOSIS — A419 Sepsis, unspecified organism: Secondary | ICD-10-CM | POA: Diagnosis not present

## 2022-11-22 DIAGNOSIS — M47812 Spondylosis without myelopathy or radiculopathy, cervical region: Secondary | ICD-10-CM | POA: Diagnosis not present

## 2022-11-22 DIAGNOSIS — J471 Bronchiectasis with (acute) exacerbation: Secondary | ICD-10-CM | POA: Diagnosis not present

## 2022-11-22 DIAGNOSIS — I5020 Unspecified systolic (congestive) heart failure: Secondary | ICD-10-CM | POA: Diagnosis not present

## 2022-11-22 DIAGNOSIS — B965 Pseudomonas (aeruginosa) (mallei) (pseudomallei) as the cause of diseases classified elsewhere: Secondary | ICD-10-CM | POA: Diagnosis not present

## 2022-11-22 DIAGNOSIS — I959 Hypotension, unspecified: Secondary | ICD-10-CM | POA: Diagnosis present

## 2022-11-22 DIAGNOSIS — M50223 Other cervical disc displacement at C6-C7 level: Secondary | ICD-10-CM | POA: Diagnosis not present

## 2022-11-22 DIAGNOSIS — M51379 Other intervertebral disc degeneration, lumbosacral region without mention of lumbar back pain or lower extremity pain: Secondary | ICD-10-CM | POA: Diagnosis not present

## 2022-11-22 DIAGNOSIS — J449 Chronic obstructive pulmonary disease, unspecified: Secondary | ICD-10-CM | POA: Diagnosis not present

## 2022-11-22 DIAGNOSIS — E1122 Type 2 diabetes mellitus with diabetic chronic kidney disease: Secondary | ICD-10-CM | POA: Diagnosis present

## 2022-11-22 DIAGNOSIS — D849 Immunodeficiency, unspecified: Secondary | ICD-10-CM | POA: Diagnosis present

## 2022-11-22 DIAGNOSIS — E1165 Type 2 diabetes mellitus with hyperglycemia: Secondary | ICD-10-CM | POA: Diagnosis present

## 2022-11-22 DIAGNOSIS — K5730 Diverticulosis of large intestine without perforation or abscess without bleeding: Secondary | ICD-10-CM | POA: Diagnosis not present

## 2022-11-22 DIAGNOSIS — M5081 Other cervical disc disorders,  high cervical region: Secondary | ICD-10-CM | POA: Diagnosis not present

## 2022-11-22 DIAGNOSIS — N179 Acute kidney failure, unspecified: Secondary | ICD-10-CM | POA: Diagnosis present

## 2022-11-22 DIAGNOSIS — I10 Essential (primary) hypertension: Secondary | ICD-10-CM | POA: Diagnosis not present

## 2022-11-22 DIAGNOSIS — R739 Hyperglycemia, unspecified: Secondary | ICD-10-CM | POA: Diagnosis not present

## 2022-11-22 DIAGNOSIS — M51369 Other intervertebral disc degeneration, lumbar region without mention of lumbar back pain or lower extremity pain: Secondary | ICD-10-CM | POA: Diagnosis not present

## 2022-11-22 DIAGNOSIS — I130 Hypertensive heart and chronic kidney disease with heart failure and stage 1 through stage 4 chronic kidney disease, or unspecified chronic kidney disease: Secondary | ICD-10-CM | POA: Diagnosis present

## 2022-11-22 DIAGNOSIS — Z1623 Resistance to quinolones and fluoroquinolones: Secondary | ICD-10-CM | POA: Diagnosis present

## 2022-11-22 DIAGNOSIS — K219 Gastro-esophageal reflux disease without esophagitis: Secondary | ICD-10-CM | POA: Diagnosis present

## 2022-11-22 DIAGNOSIS — N400 Enlarged prostate without lower urinary tract symptoms: Secondary | ICD-10-CM | POA: Diagnosis not present

## 2022-11-22 DIAGNOSIS — Z9481 Bone marrow transplant status: Secondary | ICD-10-CM | POA: Diagnosis not present

## 2022-11-22 DIAGNOSIS — E119 Type 2 diabetes mellitus without complications: Secondary | ICD-10-CM | POA: Diagnosis not present

## 2022-11-22 DIAGNOSIS — R531 Weakness: Secondary | ICD-10-CM | POA: Diagnosis not present

## 2022-11-22 DIAGNOSIS — K449 Diaphragmatic hernia without obstruction or gangrene: Secondary | ICD-10-CM | POA: Diagnosis present

## 2022-11-22 DIAGNOSIS — J440 Chronic obstructive pulmonary disease with acute lower respiratory infection: Secondary | ICD-10-CM | POA: Diagnosis present

## 2022-11-22 DIAGNOSIS — K429 Umbilical hernia without obstruction or gangrene: Secondary | ICD-10-CM | POA: Diagnosis not present

## 2022-11-22 DIAGNOSIS — N189 Chronic kidney disease, unspecified: Secondary | ICD-10-CM | POA: Diagnosis not present

## 2022-11-22 DIAGNOSIS — N183 Chronic kidney disease, stage 3 unspecified: Secondary | ICD-10-CM | POA: Diagnosis present

## 2022-11-22 DIAGNOSIS — N281 Cyst of kidney, acquired: Secondary | ICD-10-CM | POA: Diagnosis not present

## 2022-11-22 DIAGNOSIS — E860 Dehydration: Secondary | ICD-10-CM | POA: Diagnosis present

## 2022-11-22 DIAGNOSIS — R0602 Shortness of breath: Secondary | ICD-10-CM | POA: Diagnosis not present

## 2022-11-22 DIAGNOSIS — I4510 Unspecified right bundle-branch block: Secondary | ICD-10-CM | POA: Diagnosis not present

## 2022-11-22 DIAGNOSIS — R319 Hematuria, unspecified: Secondary | ICD-10-CM | POA: Diagnosis present

## 2022-11-23 DIAGNOSIS — M51369 Other intervertebral disc degeneration, lumbar region without mention of lumbar back pain or lower extremity pain: Secondary | ICD-10-CM | POA: Diagnosis not present

## 2022-11-23 DIAGNOSIS — K219 Gastro-esophageal reflux disease without esophagitis: Secondary | ICD-10-CM | POA: Diagnosis present

## 2022-11-23 DIAGNOSIS — N189 Chronic kidney disease, unspecified: Secondary | ICD-10-CM | POA: Diagnosis not present

## 2022-11-23 DIAGNOSIS — K5900 Constipation, unspecified: Secondary | ICD-10-CM | POA: Diagnosis not present

## 2022-11-23 DIAGNOSIS — R7881 Bacteremia: Secondary | ICD-10-CM | POA: Diagnosis not present

## 2022-11-23 DIAGNOSIS — N400 Enlarged prostate without lower urinary tract symptoms: Secondary | ICD-10-CM | POA: Diagnosis not present

## 2022-11-23 DIAGNOSIS — K5903 Drug induced constipation: Secondary | ICD-10-CM | POA: Diagnosis not present

## 2022-11-23 DIAGNOSIS — K449 Diaphragmatic hernia without obstruction or gangrene: Secondary | ICD-10-CM | POA: Diagnosis present

## 2022-11-23 DIAGNOSIS — I10 Essential (primary) hypertension: Secondary | ICD-10-CM | POA: Diagnosis not present

## 2022-11-23 DIAGNOSIS — M5126 Other intervertebral disc displacement, lumbar region: Secondary | ICD-10-CM | POA: Diagnosis not present

## 2022-11-23 DIAGNOSIS — M51379 Other intervertebral disc degeneration, lumbosacral region without mention of lumbar back pain or lower extremity pain: Secondary | ICD-10-CM | POA: Diagnosis not present

## 2022-11-23 DIAGNOSIS — B965 Pseudomonas (aeruginosa) (mallei) (pseudomallei) as the cause of diseases classified elsewhere: Secondary | ICD-10-CM | POA: Diagnosis not present

## 2022-11-23 DIAGNOSIS — M5030 Other cervical disc degeneration, unspecified cervical region: Secondary | ICD-10-CM | POA: Diagnosis not present

## 2022-11-23 DIAGNOSIS — M47812 Spondylosis without myelopathy or radiculopathy, cervical region: Secondary | ICD-10-CM | POA: Diagnosis not present

## 2022-11-23 DIAGNOSIS — G629 Polyneuropathy, unspecified: Secondary | ICD-10-CM | POA: Diagnosis present

## 2022-11-23 DIAGNOSIS — N281 Cyst of kidney, acquired: Secondary | ICD-10-CM | POA: Diagnosis not present

## 2022-11-23 DIAGNOSIS — J440 Chronic obstructive pulmonary disease with acute lower respiratory infection: Secondary | ICD-10-CM | POA: Diagnosis present

## 2022-11-23 DIAGNOSIS — I480 Paroxysmal atrial fibrillation: Secondary | ICD-10-CM | POA: Diagnosis not present

## 2022-11-23 DIAGNOSIS — D849 Immunodeficiency, unspecified: Secondary | ICD-10-CM | POA: Diagnosis present

## 2022-11-23 DIAGNOSIS — E785 Hyperlipidemia, unspecified: Secondary | ICD-10-CM | POA: Diagnosis present

## 2022-11-23 DIAGNOSIS — I4891 Unspecified atrial fibrillation: Secondary | ICD-10-CM | POA: Diagnosis not present

## 2022-11-23 DIAGNOSIS — N183 Chronic kidney disease, stage 3 unspecified: Secondary | ICD-10-CM | POA: Diagnosis present

## 2022-11-23 DIAGNOSIS — I2699 Other pulmonary embolism without acute cor pulmonale: Secondary | ICD-10-CM | POA: Diagnosis not present

## 2022-11-23 DIAGNOSIS — I351 Nonrheumatic aortic (valve) insufficiency: Secondary | ICD-10-CM | POA: Diagnosis not present

## 2022-11-23 DIAGNOSIS — R6520 Severe sepsis without septic shock: Secondary | ICD-10-CM | POA: Diagnosis present

## 2022-11-23 DIAGNOSIS — R531 Weakness: Secondary | ICD-10-CM | POA: Diagnosis not present

## 2022-11-23 DIAGNOSIS — R0602 Shortness of breath: Secondary | ICD-10-CM | POA: Diagnosis not present

## 2022-11-23 DIAGNOSIS — E1165 Type 2 diabetes mellitus with hyperglycemia: Secondary | ICD-10-CM | POA: Diagnosis present

## 2022-11-23 DIAGNOSIS — R140 Abdominal distension (gaseous): Secondary | ICD-10-CM | POA: Diagnosis not present

## 2022-11-23 DIAGNOSIS — D649 Anemia, unspecified: Secondary | ICD-10-CM | POA: Diagnosis not present

## 2022-11-23 DIAGNOSIS — Z1623 Resistance to quinolones and fluoroquinolones: Secondary | ICD-10-CM | POA: Diagnosis present

## 2022-11-23 DIAGNOSIS — M50223 Other cervical disc displacement at C6-C7 level: Secondary | ICD-10-CM | POA: Diagnosis not present

## 2022-11-23 DIAGNOSIS — C9000 Multiple myeloma not having achieved remission: Secondary | ICD-10-CM | POA: Diagnosis present

## 2022-11-23 DIAGNOSIS — A4152 Sepsis due to Pseudomonas: Secondary | ICD-10-CM | POA: Diagnosis present

## 2022-11-23 DIAGNOSIS — J189 Pneumonia, unspecified organism: Secondary | ICD-10-CM | POA: Diagnosis present

## 2022-11-23 DIAGNOSIS — I5023 Acute on chronic systolic (congestive) heart failure: Secondary | ICD-10-CM | POA: Diagnosis present

## 2022-11-23 DIAGNOSIS — E860 Dehydration: Secondary | ICD-10-CM | POA: Diagnosis present

## 2022-11-23 DIAGNOSIS — I130 Hypertensive heart and chronic kidney disease with heart failure and stage 1 through stage 4 chronic kidney disease, or unspecified chronic kidney disease: Secondary | ICD-10-CM | POA: Diagnosis present

## 2022-11-23 DIAGNOSIS — I5020 Unspecified systolic (congestive) heart failure: Secondary | ICD-10-CM | POA: Diagnosis not present

## 2022-11-23 DIAGNOSIS — A419 Sepsis, unspecified organism: Secondary | ICD-10-CM | POA: Diagnosis not present

## 2022-11-23 DIAGNOSIS — J471 Bronchiectasis with (acute) exacerbation: Secondary | ICD-10-CM | POA: Diagnosis present

## 2022-11-23 DIAGNOSIS — E1122 Type 2 diabetes mellitus with diabetic chronic kidney disease: Secondary | ICD-10-CM | POA: Diagnosis present

## 2022-11-23 DIAGNOSIS — I959 Hypotension, unspecified: Secondary | ICD-10-CM | POA: Diagnosis present

## 2022-11-23 DIAGNOSIS — M5081 Other cervical disc disorders,  high cervical region: Secondary | ICD-10-CM | POA: Diagnosis not present

## 2022-11-23 DIAGNOSIS — J449 Chronic obstructive pulmonary disease, unspecified: Secondary | ICD-10-CM | POA: Diagnosis not present

## 2022-11-23 DIAGNOSIS — I2510 Atherosclerotic heart disease of native coronary artery without angina pectoris: Secondary | ICD-10-CM | POA: Diagnosis not present

## 2022-11-23 DIAGNOSIS — M5127 Other intervertebral disc displacement, lumbosacral region: Secondary | ICD-10-CM | POA: Diagnosis not present

## 2022-11-23 DIAGNOSIS — R319 Hematuria, unspecified: Secondary | ICD-10-CM | POA: Diagnosis present

## 2022-11-23 DIAGNOSIS — Z9079 Acquired absence of other genital organ(s): Secondary | ICD-10-CM | POA: Diagnosis not present

## 2022-11-23 DIAGNOSIS — Z9481 Bone marrow transplant status: Secondary | ICD-10-CM | POA: Diagnosis not present

## 2022-11-23 DIAGNOSIS — N179 Acute kidney failure, unspecified: Secondary | ICD-10-CM | POA: Diagnosis present

## 2022-11-23 DIAGNOSIS — E1142 Type 2 diabetes mellitus with diabetic polyneuropathy: Secondary | ICD-10-CM | POA: Diagnosis present

## 2022-11-24 DIAGNOSIS — N400 Enlarged prostate without lower urinary tract symptoms: Secondary | ICD-10-CM | POA: Diagnosis not present

## 2022-11-24 DIAGNOSIS — B965 Pseudomonas (aeruginosa) (mallei) (pseudomallei) as the cause of diseases classified elsewhere: Secondary | ICD-10-CM | POA: Diagnosis not present

## 2022-11-24 DIAGNOSIS — R7881 Bacteremia: Secondary | ICD-10-CM | POA: Diagnosis not present

## 2022-11-24 DIAGNOSIS — J449 Chronic obstructive pulmonary disease, unspecified: Secondary | ICD-10-CM | POA: Diagnosis not present

## 2022-11-24 DIAGNOSIS — I4891 Unspecified atrial fibrillation: Secondary | ICD-10-CM | POA: Diagnosis not present

## 2022-11-24 DIAGNOSIS — A419 Sepsis, unspecified organism: Secondary | ICD-10-CM | POA: Diagnosis not present

## 2022-11-24 DIAGNOSIS — N189 Chronic kidney disease, unspecified: Secondary | ICD-10-CM | POA: Diagnosis not present

## 2022-11-25 DIAGNOSIS — N189 Chronic kidney disease, unspecified: Secondary | ICD-10-CM | POA: Diagnosis not present

## 2022-11-25 DIAGNOSIS — J449 Chronic obstructive pulmonary disease, unspecified: Secondary | ICD-10-CM | POA: Diagnosis not present

## 2022-11-25 DIAGNOSIS — N400 Enlarged prostate without lower urinary tract symptoms: Secondary | ICD-10-CM | POA: Diagnosis not present

## 2022-11-25 DIAGNOSIS — A419 Sepsis, unspecified organism: Secondary | ICD-10-CM | POA: Diagnosis not present

## 2022-11-25 DIAGNOSIS — R7881 Bacteremia: Secondary | ICD-10-CM | POA: Diagnosis not present

## 2022-11-25 DIAGNOSIS — B965 Pseudomonas (aeruginosa) (mallei) (pseudomallei) as the cause of diseases classified elsewhere: Secondary | ICD-10-CM | POA: Diagnosis not present

## 2022-11-25 DIAGNOSIS — I4891 Unspecified atrial fibrillation: Secondary | ICD-10-CM | POA: Diagnosis not present

## 2022-11-28 DIAGNOSIS — R7881 Bacteremia: Secondary | ICD-10-CM | POA: Diagnosis not present

## 2022-11-28 DIAGNOSIS — I4891 Unspecified atrial fibrillation: Secondary | ICD-10-CM | POA: Diagnosis not present

## 2022-11-28 DIAGNOSIS — A419 Sepsis, unspecified organism: Secondary | ICD-10-CM | POA: Diagnosis not present

## 2022-11-28 DIAGNOSIS — J449 Chronic obstructive pulmonary disease, unspecified: Secondary | ICD-10-CM | POA: Diagnosis not present

## 2022-11-28 DIAGNOSIS — N189 Chronic kidney disease, unspecified: Secondary | ICD-10-CM | POA: Diagnosis not present

## 2022-11-28 DIAGNOSIS — B965 Pseudomonas (aeruginosa) (mallei) (pseudomallei) as the cause of diseases classified elsewhere: Secondary | ICD-10-CM | POA: Diagnosis not present

## 2022-11-28 DIAGNOSIS — N400 Enlarged prostate without lower urinary tract symptoms: Secondary | ICD-10-CM | POA: Diagnosis not present

## 2022-11-29 DIAGNOSIS — B965 Pseudomonas (aeruginosa) (mallei) (pseudomallei) as the cause of diseases classified elsewhere: Secondary | ICD-10-CM | POA: Diagnosis not present

## 2022-11-29 DIAGNOSIS — J449 Chronic obstructive pulmonary disease, unspecified: Secondary | ICD-10-CM | POA: Diagnosis not present

## 2022-11-29 DIAGNOSIS — A4152 Sepsis due to Pseudomonas: Secondary | ICD-10-CM | POA: Diagnosis not present

## 2022-11-29 DIAGNOSIS — R7881 Bacteremia: Secondary | ICD-10-CM | POA: Diagnosis not present

## 2022-11-29 DIAGNOSIS — I2510 Atherosclerotic heart disease of native coronary artery without angina pectoris: Secondary | ICD-10-CM | POA: Diagnosis not present

## 2022-11-29 DIAGNOSIS — I2699 Other pulmonary embolism without acute cor pulmonale: Secondary | ICD-10-CM | POA: Diagnosis not present

## 2022-11-29 DIAGNOSIS — I10 Essential (primary) hypertension: Secondary | ICD-10-CM | POA: Diagnosis not present

## 2022-11-29 DIAGNOSIS — J189 Pneumonia, unspecified organism: Secondary | ICD-10-CM | POA: Diagnosis not present

## 2022-11-29 DIAGNOSIS — I4891 Unspecified atrial fibrillation: Secondary | ICD-10-CM | POA: Diagnosis not present

## 2022-11-29 DIAGNOSIS — N400 Enlarged prostate without lower urinary tract symptoms: Secondary | ICD-10-CM | POA: Diagnosis not present

## 2022-11-29 DIAGNOSIS — N189 Chronic kidney disease, unspecified: Secondary | ICD-10-CM | POA: Diagnosis not present

## 2022-11-29 DIAGNOSIS — A419 Sepsis, unspecified organism: Secondary | ICD-10-CM | POA: Diagnosis not present

## 2022-11-29 DIAGNOSIS — I5020 Unspecified systolic (congestive) heart failure: Secondary | ICD-10-CM | POA: Diagnosis not present

## 2022-11-30 DIAGNOSIS — J189 Pneumonia, unspecified organism: Secondary | ICD-10-CM | POA: Diagnosis not present

## 2022-11-30 DIAGNOSIS — I10 Essential (primary) hypertension: Secondary | ICD-10-CM | POA: Diagnosis not present

## 2022-11-30 DIAGNOSIS — I2699 Other pulmonary embolism without acute cor pulmonale: Secondary | ICD-10-CM | POA: Diagnosis not present

## 2022-11-30 DIAGNOSIS — I2510 Atherosclerotic heart disease of native coronary artery without angina pectoris: Secondary | ICD-10-CM | POA: Diagnosis not present

## 2022-11-30 DIAGNOSIS — A419 Sepsis, unspecified organism: Secondary | ICD-10-CM | POA: Diagnosis not present

## 2022-11-30 DIAGNOSIS — J449 Chronic obstructive pulmonary disease, unspecified: Secondary | ICD-10-CM | POA: Diagnosis not present

## 2022-11-30 DIAGNOSIS — N400 Enlarged prostate without lower urinary tract symptoms: Secondary | ICD-10-CM | POA: Diagnosis not present

## 2022-11-30 DIAGNOSIS — N189 Chronic kidney disease, unspecified: Secondary | ICD-10-CM | POA: Diagnosis not present

## 2022-11-30 DIAGNOSIS — I4891 Unspecified atrial fibrillation: Secondary | ICD-10-CM | POA: Diagnosis not present

## 2022-11-30 DIAGNOSIS — A4152 Sepsis due to Pseudomonas: Secondary | ICD-10-CM | POA: Diagnosis not present

## 2022-11-30 DIAGNOSIS — B965 Pseudomonas (aeruginosa) (mallei) (pseudomallei) as the cause of diseases classified elsewhere: Secondary | ICD-10-CM | POA: Diagnosis not present

## 2022-11-30 DIAGNOSIS — R7881 Bacteremia: Secondary | ICD-10-CM | POA: Diagnosis not present

## 2022-11-30 DIAGNOSIS — I5020 Unspecified systolic (congestive) heart failure: Secondary | ICD-10-CM | POA: Diagnosis not present

## 2022-12-01 DIAGNOSIS — I10 Essential (primary) hypertension: Secondary | ICD-10-CM | POA: Diagnosis not present

## 2022-12-01 DIAGNOSIS — I2699 Other pulmonary embolism without acute cor pulmonale: Secondary | ICD-10-CM | POA: Diagnosis not present

## 2022-12-01 DIAGNOSIS — J189 Pneumonia, unspecified organism: Secondary | ICD-10-CM | POA: Diagnosis not present

## 2022-12-01 DIAGNOSIS — I2510 Atherosclerotic heart disease of native coronary artery without angina pectoris: Secondary | ICD-10-CM | POA: Diagnosis not present

## 2022-12-01 DIAGNOSIS — A4152 Sepsis due to Pseudomonas: Secondary | ICD-10-CM | POA: Diagnosis not present

## 2022-12-01 DIAGNOSIS — I5020 Unspecified systolic (congestive) heart failure: Secondary | ICD-10-CM | POA: Diagnosis not present

## 2022-12-02 ENCOUNTER — Other Ambulatory Visit: Payer: Self-pay | Admitting: Sports Medicine

## 2022-12-02 ENCOUNTER — Ambulatory Visit: Payer: Medicare Other | Admitting: Sports Medicine

## 2022-12-02 DIAGNOSIS — R3916 Straining to void: Secondary | ICD-10-CM | POA: Diagnosis not present

## 2022-12-02 DIAGNOSIS — K219 Gastro-esophageal reflux disease without esophagitis: Secondary | ICD-10-CM | POA: Diagnosis not present

## 2022-12-02 DIAGNOSIS — I5022 Chronic systolic (congestive) heart failure: Secondary | ICD-10-CM | POA: Diagnosis not present

## 2022-12-02 DIAGNOSIS — G4733 Obstructive sleep apnea (adult) (pediatric): Secondary | ICD-10-CM | POA: Diagnosis not present

## 2022-12-02 DIAGNOSIS — E119 Type 2 diabetes mellitus without complications: Secondary | ICD-10-CM | POA: Diagnosis not present

## 2022-12-02 DIAGNOSIS — N183 Chronic kidney disease, stage 3 unspecified: Secondary | ICD-10-CM | POA: Diagnosis not present

## 2022-12-02 DIAGNOSIS — N400 Enlarged prostate without lower urinary tract symptoms: Secondary | ICD-10-CM | POA: Diagnosis not present

## 2022-12-02 DIAGNOSIS — I5023 Acute on chronic systolic (congestive) heart failure: Secondary | ICD-10-CM | POA: Diagnosis not present

## 2022-12-02 DIAGNOSIS — Z86718 Personal history of other venous thrombosis and embolism: Secondary | ICD-10-CM | POA: Diagnosis not present

## 2022-12-02 DIAGNOSIS — C9000 Multiple myeloma not having achieved remission: Secondary | ICD-10-CM | POA: Diagnosis not present

## 2022-12-02 DIAGNOSIS — J302 Other seasonal allergic rhinitis: Secondary | ICD-10-CM | POA: Diagnosis not present

## 2022-12-02 DIAGNOSIS — G8929 Other chronic pain: Secondary | ICD-10-CM | POA: Diagnosis not present

## 2022-12-02 DIAGNOSIS — I2510 Atherosclerotic heart disease of native coronary artery without angina pectoris: Secondary | ICD-10-CM | POA: Diagnosis not present

## 2022-12-02 DIAGNOSIS — N189 Chronic kidney disease, unspecified: Secondary | ICD-10-CM | POA: Diagnosis not present

## 2022-12-02 DIAGNOSIS — I4892 Unspecified atrial flutter: Secondary | ICD-10-CM | POA: Diagnosis not present

## 2022-12-02 DIAGNOSIS — J440 Chronic obstructive pulmonary disease with acute lower respiratory infection: Secondary | ICD-10-CM | POA: Diagnosis not present

## 2022-12-02 DIAGNOSIS — N401 Enlarged prostate with lower urinary tract symptoms: Secondary | ICD-10-CM | POA: Diagnosis not present

## 2022-12-02 DIAGNOSIS — I110 Hypertensive heart disease with heart failure: Secondary | ICD-10-CM | POA: Diagnosis not present

## 2022-12-02 DIAGNOSIS — I255 Ischemic cardiomyopathy: Secondary | ICD-10-CM | POA: Diagnosis not present

## 2022-12-02 DIAGNOSIS — I2699 Other pulmonary embolism without acute cor pulmonale: Secondary | ICD-10-CM | POA: Diagnosis not present

## 2022-12-02 DIAGNOSIS — D6481 Anemia due to antineoplastic chemotherapy: Secondary | ICD-10-CM | POA: Diagnosis not present

## 2022-12-02 DIAGNOSIS — E1122 Type 2 diabetes mellitus with diabetic chronic kidney disease: Secondary | ICD-10-CM | POA: Diagnosis not present

## 2022-12-02 DIAGNOSIS — Z7901 Long term (current) use of anticoagulants: Secondary | ICD-10-CM | POA: Diagnosis not present

## 2022-12-02 DIAGNOSIS — M5030 Other cervical disc degeneration, unspecified cervical region: Secondary | ICD-10-CM

## 2022-12-02 DIAGNOSIS — A4152 Sepsis due to Pseudomonas: Secondary | ICD-10-CM | POA: Diagnosis not present

## 2022-12-02 DIAGNOSIS — R2689 Other abnormalities of gait and mobility: Secondary | ICD-10-CM | POA: Diagnosis not present

## 2022-12-02 DIAGNOSIS — R5381 Other malaise: Secondary | ICD-10-CM | POA: Diagnosis not present

## 2022-12-02 DIAGNOSIS — I5020 Unspecified systolic (congestive) heart failure: Secondary | ICD-10-CM | POA: Diagnosis not present

## 2022-12-02 DIAGNOSIS — M47815 Spondylosis without myelopathy or radiculopathy, thoracolumbar region: Secondary | ICD-10-CM

## 2022-12-02 DIAGNOSIS — Z86711 Personal history of pulmonary embolism: Secondary | ICD-10-CM | POA: Diagnosis not present

## 2022-12-02 DIAGNOSIS — I130 Hypertensive heart and chronic kidney disease with heart failure and stage 1 through stage 4 chronic kidney disease, or unspecified chronic kidney disease: Secondary | ICD-10-CM | POA: Diagnosis not present

## 2022-12-02 DIAGNOSIS — E1140 Type 2 diabetes mellitus with diabetic neuropathy, unspecified: Secondary | ICD-10-CM | POA: Diagnosis not present

## 2022-12-02 DIAGNOSIS — I10 Essential (primary) hypertension: Secondary | ICD-10-CM | POA: Diagnosis not present

## 2022-12-02 DIAGNOSIS — G629 Polyneuropathy, unspecified: Secondary | ICD-10-CM | POA: Diagnosis not present

## 2022-12-02 DIAGNOSIS — J189 Pneumonia, unspecified organism: Secondary | ICD-10-CM | POA: Diagnosis not present

## 2022-12-02 DIAGNOSIS — E039 Hypothyroidism, unspecified: Secondary | ICD-10-CM | POA: Diagnosis not present

## 2022-12-02 DIAGNOSIS — D630 Anemia in neoplastic disease: Secondary | ICD-10-CM | POA: Diagnosis not present

## 2022-12-02 DIAGNOSIS — Z7984 Long term (current) use of oral hypoglycemic drugs: Secondary | ICD-10-CM | POA: Diagnosis not present

## 2022-12-02 DIAGNOSIS — I480 Paroxysmal atrial fibrillation: Secondary | ICD-10-CM | POA: Diagnosis not present

## 2022-12-02 DIAGNOSIS — J151 Pneumonia due to Pseudomonas: Secondary | ICD-10-CM | POA: Diagnosis not present

## 2022-12-02 DIAGNOSIS — Z955 Presence of coronary angioplasty implant and graft: Secondary | ICD-10-CM | POA: Diagnosis not present

## 2022-12-02 DIAGNOSIS — B965 Pseudomonas (aeruginosa) (mallei) (pseudomallei) as the cause of diseases classified elsewhere: Secondary | ICD-10-CM | POA: Diagnosis not present

## 2022-12-02 DIAGNOSIS — M3210 Systemic lupus erythematosus, organ or system involvement unspecified: Secondary | ICD-10-CM | POA: Diagnosis not present

## 2022-12-02 DIAGNOSIS — C9002 Multiple myeloma in relapse: Secondary | ICD-10-CM | POA: Diagnosis not present

## 2022-12-02 DIAGNOSIS — Z7982 Long term (current) use of aspirin: Secondary | ICD-10-CM | POA: Diagnosis not present

## 2022-12-02 DIAGNOSIS — J449 Chronic obstructive pulmonary disease, unspecified: Secondary | ICD-10-CM | POA: Diagnosis not present

## 2022-12-02 DIAGNOSIS — I4891 Unspecified atrial fibrillation: Secondary | ICD-10-CM | POA: Diagnosis not present

## 2022-12-02 DIAGNOSIS — M48061 Spinal stenosis, lumbar region without neurogenic claudication: Secondary | ICD-10-CM | POA: Diagnosis not present

## 2022-12-02 DIAGNOSIS — N3000 Acute cystitis without hematuria: Secondary | ICD-10-CM | POA: Diagnosis not present

## 2022-12-02 MED ORDER — OXYCODONE-ACETAMINOPHEN 10-325 MG PO TABS: ORAL_TABLET | ORAL | 0 refills | Status: DC

## 2022-12-02 MED ORDER — XTAMPZA ER 9 MG PO C12A: 1.0000 | EXTENDED_RELEASE_CAPSULE | Freq: Two times a day (BID) | ORAL | 0 refills | Status: DC

## 2022-12-09 ENCOUNTER — Encounter: Payer: Self-pay | Admitting: Family Medicine

## 2022-12-09 DIAGNOSIS — N401 Enlarged prostate with lower urinary tract symptoms: Secondary | ICD-10-CM | POA: Diagnosis not present

## 2022-12-09 DIAGNOSIS — E1140 Type 2 diabetes mellitus with diabetic neuropathy, unspecified: Secondary | ICD-10-CM | POA: Diagnosis not present

## 2022-12-09 DIAGNOSIS — Z86711 Personal history of pulmonary embolism: Secondary | ICD-10-CM | POA: Diagnosis not present

## 2022-12-09 DIAGNOSIS — I5022 Chronic systolic (congestive) heart failure: Secondary | ICD-10-CM | POA: Diagnosis not present

## 2022-12-09 DIAGNOSIS — I255 Ischemic cardiomyopathy: Secondary | ICD-10-CM | POA: Diagnosis not present

## 2022-12-09 DIAGNOSIS — A4152 Sepsis due to Pseudomonas: Secondary | ICD-10-CM | POA: Diagnosis not present

## 2022-12-09 DIAGNOSIS — I130 Hypertensive heart and chronic kidney disease with heart failure and stage 1 through stage 4 chronic kidney disease, or unspecified chronic kidney disease: Secondary | ICD-10-CM | POA: Diagnosis not present

## 2022-12-09 DIAGNOSIS — K219 Gastro-esophageal reflux disease without esophagitis: Secondary | ICD-10-CM | POA: Diagnosis not present

## 2022-12-09 DIAGNOSIS — N3000 Acute cystitis without hematuria: Secondary | ICD-10-CM | POA: Diagnosis not present

## 2022-12-09 DIAGNOSIS — E1122 Type 2 diabetes mellitus with diabetic chronic kidney disease: Secondary | ICD-10-CM | POA: Diagnosis not present

## 2022-12-09 DIAGNOSIS — B965 Pseudomonas (aeruginosa) (mallei) (pseudomallei) as the cause of diseases classified elsewhere: Secondary | ICD-10-CM | POA: Diagnosis not present

## 2022-12-09 DIAGNOSIS — Z7982 Long term (current) use of aspirin: Secondary | ICD-10-CM | POA: Diagnosis not present

## 2022-12-09 DIAGNOSIS — J440 Chronic obstructive pulmonary disease with acute lower respiratory infection: Secondary | ICD-10-CM | POA: Diagnosis not present

## 2022-12-09 DIAGNOSIS — I2510 Atherosclerotic heart disease of native coronary artery without angina pectoris: Secondary | ICD-10-CM | POA: Diagnosis not present

## 2022-12-09 DIAGNOSIS — Z7984 Long term (current) use of oral hypoglycemic drugs: Secondary | ICD-10-CM | POA: Diagnosis not present

## 2022-12-09 DIAGNOSIS — J151 Pneumonia due to Pseudomonas: Secondary | ICD-10-CM | POA: Diagnosis not present

## 2022-12-09 DIAGNOSIS — Z955 Presence of coronary angioplasty implant and graft: Secondary | ICD-10-CM | POA: Diagnosis not present

## 2022-12-09 DIAGNOSIS — M3210 Systemic lupus erythematosus, organ or system involvement unspecified: Secondary | ICD-10-CM | POA: Diagnosis not present

## 2022-12-09 DIAGNOSIS — I480 Paroxysmal atrial fibrillation: Secondary | ICD-10-CM | POA: Diagnosis not present

## 2022-12-09 DIAGNOSIS — N183 Chronic kidney disease, stage 3 unspecified: Secondary | ICD-10-CM | POA: Diagnosis not present

## 2022-12-09 DIAGNOSIS — R3916 Straining to void: Secondary | ICD-10-CM | POA: Diagnosis not present

## 2022-12-09 DIAGNOSIS — E039 Hypothyroidism, unspecified: Secondary | ICD-10-CM | POA: Diagnosis not present

## 2022-12-09 DIAGNOSIS — C9002 Multiple myeloma in relapse: Secondary | ICD-10-CM | POA: Diagnosis not present

## 2022-12-09 DIAGNOSIS — Z7901 Long term (current) use of anticoagulants: Secondary | ICD-10-CM | POA: Diagnosis not present

## 2022-12-12 ENCOUNTER — Telehealth: Payer: Self-pay | Admitting: Sports Medicine

## 2022-12-12 DIAGNOSIS — C9001 Multiple myeloma in remission: Secondary | ICD-10-CM | POA: Diagnosis not present

## 2022-12-12 DIAGNOSIS — I10 Essential (primary) hypertension: Secondary | ICD-10-CM | POA: Diagnosis not present

## 2022-12-12 DIAGNOSIS — Z9484 Stem cells transplant status: Secondary | ICD-10-CM | POA: Diagnosis not present

## 2022-12-12 DIAGNOSIS — R2689 Other abnormalities of gait and mobility: Secondary | ICD-10-CM | POA: Diagnosis not present

## 2022-12-12 DIAGNOSIS — C9002 Multiple myeloma in relapse: Secondary | ICD-10-CM | POA: Diagnosis not present

## 2022-12-12 DIAGNOSIS — M6281 Muscle weakness (generalized): Secondary | ICD-10-CM | POA: Diagnosis not present

## 2022-12-12 DIAGNOSIS — J151 Pneumonia due to Pseudomonas: Secondary | ICD-10-CM | POA: Diagnosis not present

## 2022-12-12 DIAGNOSIS — Z79899 Other long term (current) drug therapy: Secondary | ICD-10-CM | POA: Diagnosis not present

## 2022-12-12 DIAGNOSIS — B965 Pseudomonas (aeruginosa) (mallei) (pseudomallei) as the cause of diseases classified elsewhere: Secondary | ICD-10-CM | POA: Diagnosis not present

## 2022-12-12 DIAGNOSIS — A4152 Sepsis due to Pseudomonas: Secondary | ICD-10-CM | POA: Diagnosis not present

## 2022-12-12 DIAGNOSIS — N401 Enlarged prostate with lower urinary tract symptoms: Secondary | ICD-10-CM | POA: Diagnosis not present

## 2022-12-12 DIAGNOSIS — Z87891 Personal history of nicotine dependence: Secondary | ICD-10-CM | POA: Diagnosis not present

## 2022-12-12 DIAGNOSIS — J440 Chronic obstructive pulmonary disease with acute lower respiratory infection: Secondary | ICD-10-CM | POA: Diagnosis not present

## 2022-12-12 DIAGNOSIS — N3000 Acute cystitis without hematuria: Secondary | ICD-10-CM | POA: Diagnosis not present

## 2022-12-12 DIAGNOSIS — Z5111 Encounter for antineoplastic chemotherapy: Secondary | ICD-10-CM | POA: Diagnosis not present

## 2022-12-12 DIAGNOSIS — B370 Candidal stomatitis: Secondary | ICD-10-CM | POA: Diagnosis not present

## 2022-12-12 DIAGNOSIS — M899 Disorder of bone, unspecified: Secondary | ICD-10-CM | POA: Diagnosis not present

## 2022-12-12 DIAGNOSIS — Z7901 Long term (current) use of anticoagulants: Secondary | ICD-10-CM | POA: Diagnosis not present

## 2022-12-12 NOTE — Telephone Encounter (Signed)
[facility] called they had to move his pt evaluation to Tuesday 12-13-22 from Monday 12-12-22 [facility]

## 2022-12-13 DIAGNOSIS — A4152 Sepsis due to Pseudomonas: Secondary | ICD-10-CM | POA: Diagnosis not present

## 2022-12-13 DIAGNOSIS — N401 Enlarged prostate with lower urinary tract symptoms: Secondary | ICD-10-CM | POA: Diagnosis not present

## 2022-12-13 DIAGNOSIS — J151 Pneumonia due to Pseudomonas: Secondary | ICD-10-CM | POA: Diagnosis not present

## 2022-12-13 DIAGNOSIS — J440 Chronic obstructive pulmonary disease with acute lower respiratory infection: Secondary | ICD-10-CM | POA: Diagnosis not present

## 2022-12-13 DIAGNOSIS — B965 Pseudomonas (aeruginosa) (mallei) (pseudomallei) as the cause of diseases classified elsewhere: Secondary | ICD-10-CM | POA: Diagnosis not present

## 2022-12-13 DIAGNOSIS — N3000 Acute cystitis without hematuria: Secondary | ICD-10-CM | POA: Diagnosis not present

## 2022-12-14 DIAGNOSIS — M542 Cervicalgia: Secondary | ICD-10-CM | POA: Diagnosis not present

## 2022-12-14 DIAGNOSIS — N3000 Acute cystitis without hematuria: Secondary | ICD-10-CM | POA: Diagnosis not present

## 2022-12-14 DIAGNOSIS — J151 Pneumonia due to Pseudomonas: Secondary | ICD-10-CM | POA: Diagnosis not present

## 2022-12-14 DIAGNOSIS — M5416 Radiculopathy, lumbar region: Secondary | ICD-10-CM | POA: Diagnosis not present

## 2022-12-14 DIAGNOSIS — M47816 Spondylosis without myelopathy or radiculopathy, lumbar region: Secondary | ICD-10-CM | POA: Diagnosis not present

## 2022-12-14 DIAGNOSIS — G8929 Other chronic pain: Secondary | ICD-10-CM | POA: Diagnosis not present

## 2022-12-14 DIAGNOSIS — Z981 Arthrodesis status: Secondary | ICD-10-CM | POA: Diagnosis not present

## 2022-12-14 DIAGNOSIS — B965 Pseudomonas (aeruginosa) (mallei) (pseudomallei) as the cause of diseases classified elsewhere: Secondary | ICD-10-CM | POA: Diagnosis not present

## 2022-12-14 DIAGNOSIS — M5442 Lumbago with sciatica, left side: Secondary | ICD-10-CM | POA: Diagnosis not present

## 2022-12-14 DIAGNOSIS — N401 Enlarged prostate with lower urinary tract symptoms: Secondary | ICD-10-CM | POA: Diagnosis not present

## 2022-12-14 DIAGNOSIS — M5441 Lumbago with sciatica, right side: Secondary | ICD-10-CM | POA: Diagnosis not present

## 2022-12-14 DIAGNOSIS — M51362 Other intervertebral disc degeneration, lumbar region with discogenic back pain and lower extremity pain: Secondary | ICD-10-CM | POA: Diagnosis not present

## 2022-12-14 DIAGNOSIS — A4152 Sepsis due to Pseudomonas: Secondary | ICD-10-CM | POA: Diagnosis not present

## 2022-12-14 DIAGNOSIS — J440 Chronic obstructive pulmonary disease with acute lower respiratory infection: Secondary | ICD-10-CM | POA: Diagnosis not present

## 2022-12-14 DIAGNOSIS — M48062 Spinal stenosis, lumbar region with neurogenic claudication: Secondary | ICD-10-CM | POA: Diagnosis not present

## 2022-12-15 DIAGNOSIS — J440 Chronic obstructive pulmonary disease with acute lower respiratory infection: Secondary | ICD-10-CM | POA: Diagnosis not present

## 2022-12-15 DIAGNOSIS — J151 Pneumonia due to Pseudomonas: Secondary | ICD-10-CM | POA: Diagnosis not present

## 2022-12-15 DIAGNOSIS — N3000 Acute cystitis without hematuria: Secondary | ICD-10-CM | POA: Diagnosis not present

## 2022-12-15 DIAGNOSIS — A4152 Sepsis due to Pseudomonas: Secondary | ICD-10-CM | POA: Diagnosis not present

## 2022-12-15 DIAGNOSIS — B965 Pseudomonas (aeruginosa) (mallei) (pseudomallei) as the cause of diseases classified elsewhere: Secondary | ICD-10-CM | POA: Diagnosis not present

## 2022-12-15 DIAGNOSIS — N401 Enlarged prostate with lower urinary tract symptoms: Secondary | ICD-10-CM | POA: Diagnosis not present

## 2022-12-16 DIAGNOSIS — J151 Pneumonia due to Pseudomonas: Secondary | ICD-10-CM | POA: Diagnosis not present

## 2022-12-16 DIAGNOSIS — A4152 Sepsis due to Pseudomonas: Secondary | ICD-10-CM | POA: Diagnosis not present

## 2022-12-16 DIAGNOSIS — N3000 Acute cystitis without hematuria: Secondary | ICD-10-CM | POA: Diagnosis not present

## 2022-12-16 DIAGNOSIS — B965 Pseudomonas (aeruginosa) (mallei) (pseudomallei) as the cause of diseases classified elsewhere: Secondary | ICD-10-CM | POA: Diagnosis not present

## 2022-12-16 DIAGNOSIS — J440 Chronic obstructive pulmonary disease with acute lower respiratory infection: Secondary | ICD-10-CM | POA: Diagnosis not present

## 2022-12-16 DIAGNOSIS — N401 Enlarged prostate with lower urinary tract symptoms: Secondary | ICD-10-CM | POA: Diagnosis not present

## 2022-12-19 DIAGNOSIS — N401 Enlarged prostate with lower urinary tract symptoms: Secondary | ICD-10-CM | POA: Diagnosis not present

## 2022-12-19 DIAGNOSIS — B965 Pseudomonas (aeruginosa) (mallei) (pseudomallei) as the cause of diseases classified elsewhere: Secondary | ICD-10-CM | POA: Diagnosis not present

## 2022-12-19 DIAGNOSIS — J151 Pneumonia due to Pseudomonas: Secondary | ICD-10-CM | POA: Diagnosis not present

## 2022-12-19 DIAGNOSIS — R3916 Straining to void: Secondary | ICD-10-CM | POA: Diagnosis not present

## 2022-12-19 DIAGNOSIS — J440 Chronic obstructive pulmonary disease with acute lower respiratory infection: Secondary | ICD-10-CM | POA: Diagnosis not present

## 2022-12-19 DIAGNOSIS — N390 Urinary tract infection, site not specified: Secondary | ICD-10-CM | POA: Diagnosis not present

## 2022-12-19 DIAGNOSIS — A4152 Sepsis due to Pseudomonas: Secondary | ICD-10-CM | POA: Diagnosis not present

## 2022-12-19 DIAGNOSIS — N3000 Acute cystitis without hematuria: Secondary | ICD-10-CM | POA: Diagnosis not present

## 2022-12-20 DIAGNOSIS — N3000 Acute cystitis without hematuria: Secondary | ICD-10-CM | POA: Diagnosis not present

## 2022-12-20 DIAGNOSIS — J151 Pneumonia due to Pseudomonas: Secondary | ICD-10-CM | POA: Diagnosis not present

## 2022-12-20 DIAGNOSIS — A4152 Sepsis due to Pseudomonas: Secondary | ICD-10-CM | POA: Diagnosis not present

## 2022-12-20 DIAGNOSIS — N401 Enlarged prostate with lower urinary tract symptoms: Secondary | ICD-10-CM | POA: Diagnosis not present

## 2022-12-20 DIAGNOSIS — J440 Chronic obstructive pulmonary disease with acute lower respiratory infection: Secondary | ICD-10-CM | POA: Diagnosis not present

## 2022-12-20 DIAGNOSIS — B965 Pseudomonas (aeruginosa) (mallei) (pseudomallei) as the cause of diseases classified elsewhere: Secondary | ICD-10-CM | POA: Diagnosis not present

## 2022-12-21 ENCOUNTER — Encounter: Payer: Self-pay | Admitting: Sports Medicine

## 2022-12-22 DIAGNOSIS — N281 Cyst of kidney, acquired: Secondary | ICD-10-CM | POA: Diagnosis not present

## 2022-12-22 DIAGNOSIS — K4090 Unilateral inguinal hernia, without obstruction or gangrene, not specified as recurrent: Secondary | ICD-10-CM | POA: Diagnosis not present

## 2022-12-22 DIAGNOSIS — I484 Atypical atrial flutter: Secondary | ICD-10-CM | POA: Diagnosis not present

## 2022-12-22 DIAGNOSIS — Z7189 Other specified counseling: Secondary | ICD-10-CM | POA: Diagnosis not present

## 2022-12-22 DIAGNOSIS — R319 Hematuria, unspecified: Secondary | ICD-10-CM | POA: Diagnosis not present

## 2022-12-22 DIAGNOSIS — D649 Anemia, unspecified: Secondary | ICD-10-CM | POA: Diagnosis present

## 2022-12-22 DIAGNOSIS — R0602 Shortness of breath: Secondary | ICD-10-CM | POA: Diagnosis not present

## 2022-12-22 DIAGNOSIS — K828 Other specified diseases of gallbladder: Secondary | ICD-10-CM | POA: Diagnosis not present

## 2022-12-22 DIAGNOSIS — G319 Degenerative disease of nervous system, unspecified: Secondary | ICD-10-CM | POA: Diagnosis not present

## 2022-12-22 DIAGNOSIS — M2578 Osteophyte, vertebrae: Secondary | ICD-10-CM | POA: Diagnosis not present

## 2022-12-22 DIAGNOSIS — N170 Acute kidney failure with tubular necrosis: Secondary | ICD-10-CM | POA: Diagnosis present

## 2022-12-22 DIAGNOSIS — R509 Fever, unspecified: Secondary | ICD-10-CM | POA: Diagnosis not present

## 2022-12-22 DIAGNOSIS — R4182 Altered mental status, unspecified: Secondary | ICD-10-CM | POA: Diagnosis not present

## 2022-12-22 DIAGNOSIS — J189 Pneumonia, unspecified organism: Secondary | ICD-10-CM | POA: Diagnosis present

## 2022-12-22 DIAGNOSIS — J45909 Unspecified asthma, uncomplicated: Secondary | ICD-10-CM | POA: Diagnosis not present

## 2022-12-22 DIAGNOSIS — R6889 Other general symptoms and signs: Secondary | ICD-10-CM | POA: Diagnosis not present

## 2022-12-22 DIAGNOSIS — D759 Disease of blood and blood-forming organs, unspecified: Secondary | ICD-10-CM | POA: Diagnosis not present

## 2022-12-22 DIAGNOSIS — R410 Disorientation, unspecified: Secondary | ICD-10-CM | POA: Diagnosis not present

## 2022-12-22 DIAGNOSIS — S0990XA Unspecified injury of head, initial encounter: Secondary | ICD-10-CM | POA: Diagnosis not present

## 2022-12-22 DIAGNOSIS — K219 Gastro-esophageal reflux disease without esophagitis: Secondary | ICD-10-CM | POA: Diagnosis not present

## 2022-12-22 DIAGNOSIS — Z9484 Stem cells transplant status: Secondary | ICD-10-CM | POA: Diagnosis not present

## 2022-12-22 DIAGNOSIS — M47814 Spondylosis without myelopathy or radiculopathy, thoracic region: Secondary | ICD-10-CM | POA: Diagnosis not present

## 2022-12-22 DIAGNOSIS — M47817 Spondylosis without myelopathy or radiculopathy, lumbosacral region: Secondary | ICD-10-CM | POA: Diagnosis not present

## 2022-12-22 DIAGNOSIS — R0902 Hypoxemia: Secondary | ICD-10-CM | POA: Diagnosis not present

## 2022-12-22 DIAGNOSIS — R9082 White matter disease, unspecified: Secondary | ICD-10-CM | POA: Diagnosis not present

## 2022-12-22 DIAGNOSIS — I4510 Unspecified right bundle-branch block: Secondary | ICD-10-CM | POA: Diagnosis not present

## 2022-12-22 DIAGNOSIS — I129 Hypertensive chronic kidney disease with stage 1 through stage 4 chronic kidney disease, or unspecified chronic kidney disease: Secondary | ICD-10-CM | POA: Diagnosis not present

## 2022-12-22 DIAGNOSIS — R918 Other nonspecific abnormal finding of lung field: Secondary | ICD-10-CM | POA: Diagnosis not present

## 2022-12-22 DIAGNOSIS — R569 Unspecified convulsions: Secondary | ICD-10-CM | POA: Diagnosis not present

## 2022-12-22 DIAGNOSIS — N3000 Acute cystitis without hematuria: Secondary | ICD-10-CM | POA: Diagnosis not present

## 2022-12-22 DIAGNOSIS — G8321 Monoplegia of upper limb affecting right dominant side: Secondary | ICD-10-CM | POA: Diagnosis not present

## 2022-12-22 DIAGNOSIS — R296 Repeated falls: Secondary | ICD-10-CM | POA: Diagnosis not present

## 2022-12-22 DIAGNOSIS — K6389 Other specified diseases of intestine: Secondary | ICD-10-CM | POA: Diagnosis not present

## 2022-12-22 DIAGNOSIS — J90 Pleural effusion, not elsewhere classified: Secondary | ICD-10-CM | POA: Diagnosis not present

## 2022-12-22 DIAGNOSIS — Z8579 Personal history of other malignant neoplasms of lymphoid, hematopoietic and related tissues: Secondary | ICD-10-CM | POA: Diagnosis not present

## 2022-12-22 DIAGNOSIS — R112 Nausea with vomiting, unspecified: Secondary | ICD-10-CM | POA: Diagnosis not present

## 2022-12-22 DIAGNOSIS — M47816 Spondylosis without myelopathy or radiculopathy, lumbar region: Secondary | ICD-10-CM | POA: Diagnosis not present

## 2022-12-22 DIAGNOSIS — J440 Chronic obstructive pulmonary disease with acute lower respiratory infection: Secondary | ICD-10-CM | POA: Diagnosis not present

## 2022-12-22 DIAGNOSIS — R2681 Unsteadiness on feet: Secondary | ICD-10-CM | POA: Diagnosis not present

## 2022-12-22 DIAGNOSIS — N3289 Other specified disorders of bladder: Secondary | ICD-10-CM | POA: Diagnosis not present

## 2022-12-22 DIAGNOSIS — Z66 Do not resuscitate: Secondary | ICD-10-CM | POA: Diagnosis not present

## 2022-12-22 DIAGNOSIS — M545 Low back pain, unspecified: Secondary | ICD-10-CM | POA: Diagnosis not present

## 2022-12-22 DIAGNOSIS — E870 Hyperosmolality and hypernatremia: Secondary | ICD-10-CM | POA: Diagnosis present

## 2022-12-22 DIAGNOSIS — C9002 Multiple myeloma in relapse: Secondary | ICD-10-CM | POA: Diagnosis present

## 2022-12-22 DIAGNOSIS — G4733 Obstructive sleep apnea (adult) (pediatric): Secondary | ICD-10-CM | POA: Diagnosis not present

## 2022-12-22 DIAGNOSIS — E119 Type 2 diabetes mellitus without complications: Secondary | ICD-10-CM | POA: Diagnosis not present

## 2022-12-22 DIAGNOSIS — I10 Essential (primary) hypertension: Secondary | ICD-10-CM | POA: Diagnosis not present

## 2022-12-22 DIAGNOSIS — A4152 Sepsis due to Pseudomonas: Secondary | ICD-10-CM | POA: Diagnosis present

## 2022-12-22 DIAGNOSIS — I4892 Unspecified atrial flutter: Secondary | ICD-10-CM | POA: Diagnosis present

## 2022-12-22 DIAGNOSIS — G9340 Encephalopathy, unspecified: Secondary | ICD-10-CM | POA: Diagnosis not present

## 2022-12-22 DIAGNOSIS — Z86718 Personal history of other venous thrombosis and embolism: Secondary | ICD-10-CM | POA: Diagnosis not present

## 2022-12-22 DIAGNOSIS — N179 Acute kidney failure, unspecified: Secondary | ICD-10-CM | POA: Diagnosis not present

## 2022-12-22 DIAGNOSIS — G4730 Sleep apnea, unspecified: Secondary | ICD-10-CM | POA: Diagnosis not present

## 2022-12-22 DIAGNOSIS — N3001 Acute cystitis with hematuria: Secondary | ICD-10-CM | POA: Diagnosis present

## 2022-12-22 DIAGNOSIS — C9000 Multiple myeloma not having achieved remission: Secondary | ICD-10-CM | POA: Diagnosis not present

## 2022-12-22 DIAGNOSIS — R531 Weakness: Secondary | ICD-10-CM | POA: Diagnosis not present

## 2022-12-22 DIAGNOSIS — S3992XA Unspecified injury of lower back, initial encounter: Secondary | ICD-10-CM | POA: Diagnosis not present

## 2022-12-22 DIAGNOSIS — I6781 Acute cerebrovascular insufficiency: Secondary | ICD-10-CM | POA: Diagnosis not present

## 2022-12-22 DIAGNOSIS — Z515 Encounter for palliative care: Secondary | ICD-10-CM | POA: Diagnosis not present

## 2022-12-22 DIAGNOSIS — G4489 Other headache syndrome: Secondary | ICD-10-CM | POA: Diagnosis not present

## 2022-12-22 DIAGNOSIS — I5022 Chronic systolic (congestive) heart failure: Secondary | ICD-10-CM | POA: Diagnosis present

## 2022-12-22 DIAGNOSIS — J9601 Acute respiratory failure with hypoxia: Secondary | ICD-10-CM | POA: Diagnosis not present

## 2022-12-22 DIAGNOSIS — M549 Dorsalgia, unspecified: Secondary | ICD-10-CM | POA: Diagnosis not present

## 2022-12-22 DIAGNOSIS — B965 Pseudomonas (aeruginosa) (mallei) (pseudomallei) as the cause of diseases classified elsewhere: Secondary | ICD-10-CM | POA: Diagnosis not present

## 2022-12-22 DIAGNOSIS — R42 Dizziness and giddiness: Secondary | ICD-10-CM | POA: Diagnosis not present

## 2022-12-22 DIAGNOSIS — I6782 Cerebral ischemia: Secondary | ICD-10-CM | POA: Diagnosis not present

## 2022-12-22 DIAGNOSIS — I517 Cardiomegaly: Secondary | ICD-10-CM | POA: Diagnosis not present

## 2022-12-22 DIAGNOSIS — N390 Urinary tract infection, site not specified: Secondary | ICD-10-CM | POA: Diagnosis not present

## 2022-12-22 DIAGNOSIS — R0600 Dyspnea, unspecified: Secondary | ICD-10-CM | POA: Diagnosis not present

## 2022-12-22 DIAGNOSIS — J479 Bronchiectasis, uncomplicated: Secondary | ICD-10-CM | POA: Diagnosis not present

## 2022-12-22 DIAGNOSIS — I130 Hypertensive heart and chronic kidney disease with heart failure and stage 1 through stage 4 chronic kidney disease, or unspecified chronic kidney disease: Secondary | ICD-10-CM | POA: Diagnosis present

## 2022-12-22 DIAGNOSIS — M542 Cervicalgia: Secondary | ICD-10-CM | POA: Diagnosis not present

## 2022-12-22 DIAGNOSIS — J151 Pneumonia due to Pseudomonas: Secondary | ICD-10-CM | POA: Diagnosis not present

## 2022-12-22 DIAGNOSIS — J449 Chronic obstructive pulmonary disease, unspecified: Secondary | ICD-10-CM | POA: Diagnosis not present

## 2022-12-22 DIAGNOSIS — W19XXXA Unspecified fall, initial encounter: Secondary | ICD-10-CM | POA: Diagnosis not present

## 2022-12-22 DIAGNOSIS — N401 Enlarged prostate with lower urinary tract symptoms: Secondary | ICD-10-CM | POA: Diagnosis not present

## 2022-12-22 DIAGNOSIS — W19XXXS Unspecified fall, sequela: Secondary | ICD-10-CM | POA: Diagnosis not present

## 2022-12-22 DIAGNOSIS — Z043 Encounter for examination and observation following other accident: Secondary | ICD-10-CM | POA: Diagnosis not present

## 2022-12-22 DIAGNOSIS — D849 Immunodeficiency, unspecified: Secondary | ICD-10-CM | POA: Diagnosis present

## 2022-12-22 DIAGNOSIS — I480 Paroxysmal atrial fibrillation: Secondary | ICD-10-CM | POA: Diagnosis present

## 2022-12-22 DIAGNOSIS — J439 Emphysema, unspecified: Secondary | ICD-10-CM | POA: Diagnosis present

## 2022-12-22 DIAGNOSIS — R9401 Abnormal electroencephalogram [EEG]: Secondary | ICD-10-CM | POA: Diagnosis not present

## 2022-12-22 DIAGNOSIS — M40204 Unspecified kyphosis, thoracic region: Secondary | ICD-10-CM | POA: Diagnosis not present

## 2022-12-22 DIAGNOSIS — K449 Diaphragmatic hernia without obstruction or gangrene: Secondary | ICD-10-CM | POA: Diagnosis not present

## 2022-12-22 DIAGNOSIS — E872 Acidosis, unspecified: Secondary | ICD-10-CM | POA: Diagnosis present

## 2022-12-22 DIAGNOSIS — G928 Other toxic encephalopathy: Secondary | ICD-10-CM | POA: Diagnosis present

## 2022-12-22 DIAGNOSIS — I2699 Other pulmonary embolism without acute cor pulmonale: Secondary | ICD-10-CM | POA: Diagnosis not present

## 2022-12-22 DIAGNOSIS — I509 Heart failure, unspecified: Secondary | ICD-10-CM | POA: Diagnosis not present

## 2022-12-22 DIAGNOSIS — N1831 Chronic kidney disease, stage 3a: Secondary | ICD-10-CM | POA: Diagnosis not present

## 2022-12-22 DIAGNOSIS — J9809 Other diseases of bronchus, not elsewhere classified: Secondary | ICD-10-CM | POA: Diagnosis not present

## 2022-12-22 DIAGNOSIS — R54 Age-related physical debility: Secondary | ICD-10-CM | POA: Diagnosis not present

## 2022-12-22 DIAGNOSIS — Z955 Presence of coronary angioplasty implant and graft: Secondary | ICD-10-CM | POA: Diagnosis not present

## 2022-12-22 DIAGNOSIS — M4316 Spondylolisthesis, lumbar region: Secondary | ICD-10-CM | POA: Diagnosis not present

## 2022-12-22 DIAGNOSIS — E1121 Type 2 diabetes mellitus with diabetic nephropathy: Secondary | ICD-10-CM | POA: Diagnosis present

## 2022-12-22 DIAGNOSIS — N400 Enlarged prostate without lower urinary tract symptoms: Secondary | ICD-10-CM | POA: Diagnosis not present

## 2022-12-23 ENCOUNTER — Inpatient Hospital Stay: Payer: Medicare Other | Admitting: Sports Medicine

## 2022-12-23 DIAGNOSIS — G4730 Sleep apnea, unspecified: Secondary | ICD-10-CM | POA: Diagnosis not present

## 2022-12-23 DIAGNOSIS — C9002 Multiple myeloma in relapse: Secondary | ICD-10-CM | POA: Diagnosis present

## 2022-12-23 DIAGNOSIS — I2699 Other pulmonary embolism without acute cor pulmonale: Secondary | ICD-10-CM | POA: Diagnosis not present

## 2022-12-23 DIAGNOSIS — R0602 Shortness of breath: Secondary | ICD-10-CM | POA: Diagnosis not present

## 2022-12-23 DIAGNOSIS — I484 Atypical atrial flutter: Secondary | ICD-10-CM | POA: Diagnosis not present

## 2022-12-23 DIAGNOSIS — I10 Essential (primary) hypertension: Secondary | ICD-10-CM | POA: Diagnosis not present

## 2022-12-23 DIAGNOSIS — G319 Degenerative disease of nervous system, unspecified: Secondary | ICD-10-CM | POA: Diagnosis not present

## 2022-12-23 DIAGNOSIS — R918 Other nonspecific abnormal finding of lung field: Secondary | ICD-10-CM | POA: Diagnosis not present

## 2022-12-23 DIAGNOSIS — E870 Hyperosmolality and hypernatremia: Secondary | ICD-10-CM | POA: Diagnosis present

## 2022-12-23 DIAGNOSIS — I5022 Chronic systolic (congestive) heart failure: Secondary | ICD-10-CM | POA: Diagnosis present

## 2022-12-23 DIAGNOSIS — R296 Repeated falls: Secondary | ICD-10-CM | POA: Diagnosis not present

## 2022-12-23 DIAGNOSIS — I6781 Acute cerebrovascular insufficiency: Secondary | ICD-10-CM | POA: Diagnosis not present

## 2022-12-23 DIAGNOSIS — M47814 Spondylosis without myelopathy or radiculopathy, thoracic region: Secondary | ICD-10-CM | POA: Diagnosis not present

## 2022-12-23 DIAGNOSIS — R531 Weakness: Secondary | ICD-10-CM | POA: Diagnosis not present

## 2022-12-23 DIAGNOSIS — N390 Urinary tract infection, site not specified: Secondary | ICD-10-CM | POA: Diagnosis not present

## 2022-12-23 DIAGNOSIS — R9401 Abnormal electroencephalogram [EEG]: Secondary | ICD-10-CM | POA: Diagnosis not present

## 2022-12-23 DIAGNOSIS — K4090 Unilateral inguinal hernia, without obstruction or gangrene, not specified as recurrent: Secondary | ICD-10-CM | POA: Diagnosis not present

## 2022-12-23 DIAGNOSIS — Z7189 Other specified counseling: Secondary | ICD-10-CM | POA: Diagnosis not present

## 2022-12-23 DIAGNOSIS — M2578 Osteophyte, vertebrae: Secondary | ICD-10-CM | POA: Diagnosis not present

## 2022-12-23 DIAGNOSIS — W19XXXS Unspecified fall, sequela: Secondary | ICD-10-CM | POA: Diagnosis not present

## 2022-12-23 DIAGNOSIS — J449 Chronic obstructive pulmonary disease, unspecified: Secondary | ICD-10-CM | POA: Diagnosis not present

## 2022-12-23 DIAGNOSIS — G4733 Obstructive sleep apnea (adult) (pediatric): Secondary | ICD-10-CM | POA: Diagnosis not present

## 2022-12-23 DIAGNOSIS — Z8579 Personal history of other malignant neoplasms of lymphoid, hematopoietic and related tissues: Secondary | ICD-10-CM | POA: Diagnosis not present

## 2022-12-23 DIAGNOSIS — G8321 Monoplegia of upper limb affecting right dominant side: Secondary | ICD-10-CM | POA: Diagnosis not present

## 2022-12-23 DIAGNOSIS — R569 Unspecified convulsions: Secondary | ICD-10-CM | POA: Diagnosis not present

## 2022-12-23 DIAGNOSIS — R6889 Other general symptoms and signs: Secondary | ICD-10-CM | POA: Diagnosis not present

## 2022-12-23 DIAGNOSIS — R112 Nausea with vomiting, unspecified: Secondary | ICD-10-CM | POA: Diagnosis not present

## 2022-12-23 DIAGNOSIS — J439 Emphysema, unspecified: Secondary | ICD-10-CM | POA: Diagnosis present

## 2022-12-23 DIAGNOSIS — E1121 Type 2 diabetes mellitus with diabetic nephropathy: Secondary | ICD-10-CM | POA: Diagnosis present

## 2022-12-23 DIAGNOSIS — E119 Type 2 diabetes mellitus without complications: Secondary | ICD-10-CM | POA: Diagnosis not present

## 2022-12-23 DIAGNOSIS — Z9484 Stem cells transplant status: Secondary | ICD-10-CM | POA: Diagnosis not present

## 2022-12-23 DIAGNOSIS — K6389 Other specified diseases of intestine: Secondary | ICD-10-CM | POA: Diagnosis not present

## 2022-12-23 DIAGNOSIS — R0600 Dyspnea, unspecified: Secondary | ICD-10-CM | POA: Diagnosis not present

## 2022-12-23 DIAGNOSIS — G928 Other toxic encephalopathy: Secondary | ICD-10-CM | POA: Diagnosis present

## 2022-12-23 DIAGNOSIS — D849 Immunodeficiency, unspecified: Secondary | ICD-10-CM | POA: Diagnosis present

## 2022-12-23 DIAGNOSIS — I129 Hypertensive chronic kidney disease with stage 1 through stage 4 chronic kidney disease, or unspecified chronic kidney disease: Secondary | ICD-10-CM | POA: Diagnosis not present

## 2022-12-23 DIAGNOSIS — R54 Age-related physical debility: Secondary | ICD-10-CM | POA: Diagnosis not present

## 2022-12-23 DIAGNOSIS — K828 Other specified diseases of gallbladder: Secondary | ICD-10-CM | POA: Diagnosis not present

## 2022-12-23 DIAGNOSIS — M40204 Unspecified kyphosis, thoracic region: Secondary | ICD-10-CM | POA: Diagnosis not present

## 2022-12-23 DIAGNOSIS — M47817 Spondylosis without myelopathy or radiculopathy, lumbosacral region: Secondary | ICD-10-CM | POA: Diagnosis not present

## 2022-12-23 DIAGNOSIS — M4316 Spondylolisthesis, lumbar region: Secondary | ICD-10-CM | POA: Diagnosis not present

## 2022-12-23 DIAGNOSIS — M47816 Spondylosis without myelopathy or radiculopathy, lumbar region: Secondary | ICD-10-CM | POA: Diagnosis not present

## 2022-12-23 DIAGNOSIS — A4152 Sepsis due to Pseudomonas: Secondary | ICD-10-CM | POA: Diagnosis present

## 2022-12-23 DIAGNOSIS — Z515 Encounter for palliative care: Secondary | ICD-10-CM | POA: Diagnosis not present

## 2022-12-23 DIAGNOSIS — W19XXXA Unspecified fall, initial encounter: Secondary | ICD-10-CM | POA: Diagnosis not present

## 2022-12-23 DIAGNOSIS — N281 Cyst of kidney, acquired: Secondary | ICD-10-CM | POA: Diagnosis not present

## 2022-12-23 DIAGNOSIS — N3001 Acute cystitis with hematuria: Secondary | ICD-10-CM | POA: Diagnosis present

## 2022-12-23 DIAGNOSIS — J45909 Unspecified asthma, uncomplicated: Secondary | ICD-10-CM | POA: Diagnosis not present

## 2022-12-23 DIAGNOSIS — N179 Acute kidney failure, unspecified: Secondary | ICD-10-CM | POA: Diagnosis not present

## 2022-12-23 DIAGNOSIS — D759 Disease of blood and blood-forming organs, unspecified: Secondary | ICD-10-CM | POA: Diagnosis not present

## 2022-12-23 DIAGNOSIS — Z86718 Personal history of other venous thrombosis and embolism: Secondary | ICD-10-CM | POA: Diagnosis not present

## 2022-12-23 DIAGNOSIS — J9601 Acute respiratory failure with hypoxia: Secondary | ICD-10-CM | POA: Diagnosis not present

## 2022-12-23 DIAGNOSIS — N170 Acute kidney failure with tubular necrosis: Secondary | ICD-10-CM | POA: Diagnosis present

## 2022-12-23 DIAGNOSIS — I130 Hypertensive heart and chronic kidney disease with heart failure and stage 1 through stage 4 chronic kidney disease, or unspecified chronic kidney disease: Secondary | ICD-10-CM | POA: Diagnosis present

## 2022-12-23 DIAGNOSIS — R4182 Altered mental status, unspecified: Secondary | ICD-10-CM | POA: Diagnosis not present

## 2022-12-23 DIAGNOSIS — Z66 Do not resuscitate: Secondary | ICD-10-CM | POA: Diagnosis not present

## 2022-12-23 DIAGNOSIS — J479 Bronchiectasis, uncomplicated: Secondary | ICD-10-CM | POA: Diagnosis present

## 2022-12-23 DIAGNOSIS — N400 Enlarged prostate without lower urinary tract symptoms: Secondary | ICD-10-CM | POA: Diagnosis not present

## 2022-12-23 DIAGNOSIS — J9809 Other diseases of bronchus, not elsewhere classified: Secondary | ICD-10-CM | POA: Diagnosis not present

## 2022-12-23 DIAGNOSIS — I480 Paroxysmal atrial fibrillation: Secondary | ICD-10-CM | POA: Diagnosis present

## 2022-12-23 DIAGNOSIS — I6782 Cerebral ischemia: Secondary | ICD-10-CM | POA: Diagnosis not present

## 2022-12-23 DIAGNOSIS — K219 Gastro-esophageal reflux disease without esophagitis: Secondary | ICD-10-CM | POA: Diagnosis present

## 2022-12-23 DIAGNOSIS — K449 Diaphragmatic hernia without obstruction or gangrene: Secondary | ICD-10-CM | POA: Diagnosis not present

## 2022-12-23 DIAGNOSIS — I509 Heart failure, unspecified: Secondary | ICD-10-CM | POA: Diagnosis not present

## 2022-12-23 DIAGNOSIS — G9340 Encephalopathy, unspecified: Secondary | ICD-10-CM | POA: Diagnosis not present

## 2022-12-23 DIAGNOSIS — Z955 Presence of coronary angioplasty implant and graft: Secondary | ICD-10-CM | POA: Diagnosis not present

## 2022-12-23 DIAGNOSIS — I4892 Unspecified atrial flutter: Secondary | ICD-10-CM | POA: Diagnosis present

## 2022-12-23 DIAGNOSIS — J90 Pleural effusion, not elsewhere classified: Secondary | ICD-10-CM | POA: Diagnosis not present

## 2022-12-23 DIAGNOSIS — C9000 Multiple myeloma not having achieved remission: Secondary | ICD-10-CM | POA: Diagnosis not present

## 2022-12-23 DIAGNOSIS — E872 Acidosis, unspecified: Secondary | ICD-10-CM | POA: Diagnosis present

## 2022-12-23 DIAGNOSIS — N1831 Chronic kidney disease, stage 3a: Secondary | ICD-10-CM | POA: Diagnosis present

## 2022-12-23 DIAGNOSIS — D649 Anemia, unspecified: Secondary | ICD-10-CM | POA: Diagnosis present

## 2022-12-23 DIAGNOSIS — N3289 Other specified disorders of bladder: Secondary | ICD-10-CM | POA: Diagnosis not present

## 2022-12-23 DIAGNOSIS — J189 Pneumonia, unspecified organism: Secondary | ICD-10-CM | POA: Diagnosis present

## 2022-12-24 DIAGNOSIS — A4152 Sepsis due to Pseudomonas: Secondary | ICD-10-CM | POA: Diagnosis not present

## 2022-12-25 DIAGNOSIS — W19XXXA Unspecified fall, initial encounter: Secondary | ICD-10-CM | POA: Diagnosis not present

## 2022-12-25 DIAGNOSIS — A4152 Sepsis due to Pseudomonas: Secondary | ICD-10-CM | POA: Diagnosis not present

## 2022-12-25 DIAGNOSIS — C9000 Multiple myeloma not having achieved remission: Secondary | ICD-10-CM | POA: Diagnosis not present

## 2022-12-26 DIAGNOSIS — J479 Bronchiectasis, uncomplicated: Secondary | ICD-10-CM | POA: Diagnosis not present

## 2022-12-26 DIAGNOSIS — D849 Immunodeficiency, unspecified: Secondary | ICD-10-CM | POA: Diagnosis not present

## 2022-12-26 DIAGNOSIS — A4152 Sepsis due to Pseudomonas: Secondary | ICD-10-CM | POA: Diagnosis not present

## 2022-12-26 DIAGNOSIS — C9002 Multiple myeloma in relapse: Secondary | ICD-10-CM | POA: Diagnosis not present

## 2022-12-27 DIAGNOSIS — J479 Bronchiectasis, uncomplicated: Secondary | ICD-10-CM | POA: Diagnosis not present

## 2022-12-27 DIAGNOSIS — A4152 Sepsis due to Pseudomonas: Secondary | ICD-10-CM | POA: Diagnosis not present

## 2022-12-27 DIAGNOSIS — C9002 Multiple myeloma in relapse: Secondary | ICD-10-CM | POA: Diagnosis not present

## 2022-12-27 DIAGNOSIS — D849 Immunodeficiency, unspecified: Secondary | ICD-10-CM | POA: Diagnosis not present

## 2022-12-28 DIAGNOSIS — A4152 Sepsis due to Pseudomonas: Secondary | ICD-10-CM | POA: Diagnosis not present

## 2022-12-28 DIAGNOSIS — J479 Bronchiectasis, uncomplicated: Secondary | ICD-10-CM | POA: Diagnosis not present

## 2022-12-28 DIAGNOSIS — C9002 Multiple myeloma in relapse: Secondary | ICD-10-CM | POA: Diagnosis not present

## 2022-12-28 DIAGNOSIS — D849 Immunodeficiency, unspecified: Secondary | ICD-10-CM | POA: Diagnosis not present

## 2022-12-29 DIAGNOSIS — N1831 Chronic kidney disease, stage 3a: Secondary | ICD-10-CM | POA: Diagnosis not present

## 2022-12-29 DIAGNOSIS — I10 Essential (primary) hypertension: Secondary | ICD-10-CM | POA: Diagnosis not present

## 2022-12-29 DIAGNOSIS — N3001 Acute cystitis with hematuria: Secondary | ICD-10-CM | POA: Diagnosis not present

## 2022-12-29 DIAGNOSIS — I2699 Other pulmonary embolism without acute cor pulmonale: Secondary | ICD-10-CM | POA: Diagnosis not present

## 2022-12-29 DIAGNOSIS — A4152 Sepsis due to Pseudomonas: Secondary | ICD-10-CM | POA: Diagnosis not present

## 2022-12-29 DIAGNOSIS — E119 Type 2 diabetes mellitus without complications: Secondary | ICD-10-CM | POA: Diagnosis not present

## 2023-01-03 ENCOUNTER — Inpatient Hospital Stay: Payer: Medicare Other | Admitting: Sports Medicine

## 2023-01-04 ENCOUNTER — Encounter: Payer: Self-pay | Admitting: Hematology & Oncology

## 2023-01-04 NOTE — Telephone Encounter (Signed)
Telephone call  

## 2023-01-15 DEATH — deceased

## 2023-10-17 ENCOUNTER — Encounter: Payer: Self-pay | Admitting: Sports Medicine
